# Patient Record
Sex: Female | Born: 1965
Health system: Southern US, Community
[De-identification: ages and names within clinical notes are randomized; demographics above are authoritative.]

## PROBLEM LIST (undated history)

## (undated) DIAGNOSIS — M779 Enthesopathy, unspecified: Secondary | ICD-10-CM

## (undated) DIAGNOSIS — G709 Myoneural disorder, unspecified: Secondary | ICD-10-CM

## (undated) DIAGNOSIS — F32A Depression, unspecified: Secondary | ICD-10-CM

## (undated) DIAGNOSIS — C679 Malignant neoplasm of bladder, unspecified: Secondary | ICD-10-CM

## (undated) DIAGNOSIS — M1990 Unspecified osteoarthritis, unspecified site: Secondary | ICD-10-CM

## (undated) DIAGNOSIS — E785 Hyperlipidemia, unspecified: Secondary | ICD-10-CM

## (undated) DIAGNOSIS — Z8616 Personal history of COVID-19: Secondary | ICD-10-CM

## (undated) DIAGNOSIS — K219 Gastro-esophageal reflux disease without esophagitis: Secondary | ICD-10-CM

## (undated) DIAGNOSIS — G4730 Sleep apnea, unspecified: Secondary | ICD-10-CM

## (undated) DIAGNOSIS — F329 Major depressive disorder, single episode, unspecified: Secondary | ICD-10-CM

## (undated) DIAGNOSIS — M549 Dorsalgia, unspecified: Secondary | ICD-10-CM

## (undated) DIAGNOSIS — J449 Chronic obstructive pulmonary disease, unspecified: Secondary | ICD-10-CM

## (undated) DIAGNOSIS — D494 Neoplasm of unspecified behavior of bladder: Secondary | ICD-10-CM

## (undated) DIAGNOSIS — R51 Headache: Secondary | ICD-10-CM

## (undated) DIAGNOSIS — I10 Essential (primary) hypertension: Secondary | ICD-10-CM

## (undated) DIAGNOSIS — J45909 Unspecified asthma, uncomplicated: Secondary | ICD-10-CM

## (undated) DIAGNOSIS — E119 Type 2 diabetes mellitus without complications: Secondary | ICD-10-CM

## (undated) DIAGNOSIS — I700 Atherosclerosis of aorta: Secondary | ICD-10-CM

## (undated) DIAGNOSIS — R519 Headache, unspecified: Secondary | ICD-10-CM

## (undated) DIAGNOSIS — F419 Anxiety disorder, unspecified: Secondary | ICD-10-CM

## (undated) HISTORY — DX: Unspecified asthma, uncomplicated: J45.909

## (undated) HISTORY — DX: Anxiety disorder, unspecified: F41.9

## (undated) HISTORY — DX: Sleep apnea, unspecified: G47.30

## (undated) HISTORY — PX: OTHER SURGICAL HISTORY: SHX169

## (undated) HISTORY — DX: Hyperlipidemia, unspecified: E78.5

## (undated) HISTORY — DX: Headache, unspecified: R51.9

## (undated) HISTORY — DX: Depression, unspecified: F32.A

## (undated) HISTORY — DX: Chronic obstructive pulmonary disease, unspecified: J44.9

## (undated) HISTORY — DX: Enthesopathy, unspecified: M77.9

## (undated) HISTORY — DX: Major depressive disorder, single episode, unspecified: F32.9

## (undated) HISTORY — DX: Dorsalgia, unspecified: M54.9

## (undated) HISTORY — DX: Personal history of COVID-19: Z86.16

## (undated) HISTORY — DX: Myoneural disorder, unspecified: G70.9

## (undated) HISTORY — PX: COLONOSCOPY: SHX174

## (undated) HISTORY — DX: Headache: R51

---

## 1997-08-16 ENCOUNTER — Inpatient Hospital Stay (HOSPITAL_COMMUNITY): Admission: EM | Admit: 1997-08-16 | Discharge: 1997-08-23 | Payer: Self-pay | Admitting: Emergency Medicine

## 2006-03-31 ENCOUNTER — Emergency Department: Payer: Self-pay | Admitting: General Practice

## 2006-08-14 ENCOUNTER — Emergency Department: Payer: Self-pay | Admitting: Emergency Medicine

## 2009-11-05 ENCOUNTER — Emergency Department: Payer: Self-pay | Admitting: Emergency Medicine

## 2010-05-09 ENCOUNTER — Emergency Department (HOSPITAL_COMMUNITY)
Admission: EM | Admit: 2010-05-09 | Discharge: 2010-05-09 | Disposition: A | Discharge status: Home / Self Care | Payer: Self-pay | Attending: Emergency Medicine | Admitting: Emergency Medicine

## 2010-05-09 DIAGNOSIS — M545 Low back pain, unspecified: Secondary | ICD-10-CM | POA: Insufficient documentation

## 2010-05-09 DIAGNOSIS — J029 Acute pharyngitis, unspecified: Secondary | ICD-10-CM | POA: Insufficient documentation

## 2010-05-09 DIAGNOSIS — IMO0001 Reserved for inherently not codable concepts without codable children: Secondary | ICD-10-CM | POA: Insufficient documentation

## 2010-05-09 DIAGNOSIS — R7309 Other abnormal glucose: Secondary | ICD-10-CM | POA: Insufficient documentation

## 2010-05-09 DIAGNOSIS — R059 Cough, unspecified: Secondary | ICD-10-CM | POA: Insufficient documentation

## 2010-05-09 DIAGNOSIS — B9789 Other viral agents as the cause of diseases classified elsewhere: Secondary | ICD-10-CM | POA: Insufficient documentation

## 2010-05-09 DIAGNOSIS — R05 Cough: Secondary | ICD-10-CM | POA: Insufficient documentation

## 2010-05-09 DIAGNOSIS — R110 Nausea: Secondary | ICD-10-CM | POA: Insufficient documentation

## 2010-05-09 LAB — URINALYSIS, ROUTINE W REFLEX MICROSCOPIC
Hgb urine dipstick: NEGATIVE
Protein, ur: NEGATIVE mg/dL
Specific Gravity, Urine: 1.043 — ABNORMAL HIGH (ref 1.005–1.030)
Urine Glucose, Fasting: >1000 mg/dL — AB

## 2010-05-09 LAB — URINE MICROSCOPIC-ADD ON

## 2010-05-09 LAB — POCT I-STAT, CHEM 8
BUN: 9 mg/dL (ref 6–23)
Creatinine, Ser: 0.7 mg/dL (ref 0.4–1.2)
Sodium: 139 mEq/L (ref 135–145)
TCO2: 23 mmol/L (ref 0–100)

## 2010-09-18 ENCOUNTER — Emergency Department: Payer: Self-pay | Admitting: Internal Medicine

## 2010-09-27 ENCOUNTER — Emergency Department: Payer: Self-pay | Admitting: Internal Medicine

## 2010-11-23 ENCOUNTER — Emergency Department: Payer: Self-pay | Admitting: Emergency Medicine

## 2010-11-25 ENCOUNTER — Emergency Department (HOSPITAL_COMMUNITY)
Admission: EM | Admit: 2010-11-25 | Discharge: 2010-11-25 | Disposition: A | Discharge status: Home / Self Care | Payer: Self-pay | Attending: Emergency Medicine | Admitting: Emergency Medicine

## 2010-11-25 DIAGNOSIS — R220 Localized swelling, mass and lump, head: Secondary | ICD-10-CM | POA: Insufficient documentation

## 2010-11-25 DIAGNOSIS — K029 Dental caries, unspecified: Secondary | ICD-10-CM | POA: Insufficient documentation

## 2010-11-25 DIAGNOSIS — F17200 Nicotine dependence, unspecified, uncomplicated: Secondary | ICD-10-CM | POA: Insufficient documentation

## 2010-11-25 DIAGNOSIS — K047 Periapical abscess without sinus: Secondary | ICD-10-CM | POA: Insufficient documentation

## 2010-11-25 DIAGNOSIS — R509 Fever, unspecified: Secondary | ICD-10-CM | POA: Insufficient documentation

## 2010-11-25 DIAGNOSIS — K089 Disorder of teeth and supporting structures, unspecified: Secondary | ICD-10-CM | POA: Insufficient documentation

## 2010-12-08 ENCOUNTER — Emergency Department: Payer: Self-pay | Admitting: Internal Medicine

## 2011-04-20 ENCOUNTER — Emergency Department: Payer: Self-pay | Admitting: Emergency Medicine

## 2011-04-20 LAB — COMPREHENSIVE METABOLIC PANEL
Albumin: 3.7 g/dL (ref 3.4–5.0)
Alkaline Phosphatase: 93 U/L (ref 50–136)
Bilirubin,Total: 0.3 mg/dL (ref 0.2–1.0)
Calcium, Total: 8.7 mg/dL (ref 8.5–10.1)
Chloride: 105 mmol/L (ref 98–107)
Co2: 28 mmol/L (ref 21–32)
Creatinine: 0.53 mg/dL — ABNORMAL LOW (ref 0.60–1.30)
EGFR (African American): >60
EGFR (Non-African Amer.): >60
Glucose: 119 mg/dL — ABNORMAL HIGH (ref 65–99)
Potassium: 4 mmol/L (ref 3.5–5.1)
SGOT(AST): 11 U/L — ABNORMAL LOW (ref 15–37)
Sodium: 142 mmol/L (ref 136–145)

## 2011-04-20 LAB — URINALYSIS, COMPLETE
Glucose,UR: NEGATIVE mg/dL (ref 0–75)
Ketone: NEGATIVE
Protein: NEGATIVE
RBC,UR: 1 /HPF (ref 0–5)
Specific Gravity: 1.014 (ref 1.003–1.030)
WBC UR: 4 /HPF (ref 0–5)

## 2011-04-20 LAB — CBC
HCT: 35.1 % (ref 35.0–47.0)
HGB: 11 g/dL — ABNORMAL LOW (ref 12.0–16.0)
MCH: 23.7 pg — ABNORMAL LOW (ref 26.0–34.0)
MCHC: 31.3 g/dL — ABNORMAL LOW (ref 32.0–36.0)
RDW: 17.9 % — ABNORMAL HIGH (ref 11.5–14.5)

## 2011-04-20 LAB — RAPID INFLUENZA A&B ANTIGENS

## 2011-07-07 ENCOUNTER — Emergency Department (HOSPITAL_COMMUNITY): Payer: Self-pay

## 2011-07-07 ENCOUNTER — Encounter (HOSPITAL_COMMUNITY): Payer: Self-pay | Admitting: *Deleted

## 2011-07-07 ENCOUNTER — Emergency Department (HOSPITAL_COMMUNITY)
Admission: EM | Admit: 2011-07-07 | Discharge: 2011-07-07 | Disposition: A | Discharge status: Home / Self Care | Payer: Self-pay | Attending: Emergency Medicine | Admitting: Emergency Medicine

## 2011-07-07 DIAGNOSIS — M5431 Sciatica, right side: Secondary | ICD-10-CM

## 2011-07-07 DIAGNOSIS — M549 Dorsalgia, unspecified: Secondary | ICD-10-CM | POA: Insufficient documentation

## 2011-07-07 DIAGNOSIS — M5430 Sciatica, unspecified side: Secondary | ICD-10-CM | POA: Insufficient documentation

## 2011-07-07 MED ORDER — PREDNISONE 50 MG PO TABS: 50.0000 mg | ORAL_TABLET | Freq: Every day | ORAL | Status: AC

## 2011-07-07 MED ORDER — PREDNISONE 20 MG PO TABS
60.0000 mg | ORAL_TABLET | Freq: Once | ORAL | Status: AC
  Administered 2011-07-07: 60 mg via ORAL
  Filled 2011-07-07: qty 3

## 2011-07-07 MED ORDER — DIAZEPAM 5 MG PO TABS: 5.0000 mg | ORAL_TABLET | Freq: Two times a day (BID) | ORAL | Status: AC | PRN

## 2011-07-07 MED ORDER — NAPROXEN 500 MG PO TABS: 500.0000 mg | ORAL_TABLET | Freq: Two times a day (BID) | ORAL | Status: DC

## 2011-07-07 MED ORDER — MORPHINE SULFATE 4 MG/ML IJ SOLN
4.0000 mg | Freq: Once | INTRAMUSCULAR | Status: AC
  Administered 2011-07-07: 4 mg via INTRAMUSCULAR
  Filled 2011-07-07: qty 1

## 2011-07-07 MED ORDER — HYDROCODONE-ACETAMINOPHEN 5-325 MG PO TABS: 1.0000 | ORAL_TABLET | ORAL | Status: AC | PRN

## 2011-07-07 MED ORDER — DIAZEPAM 5 MG PO TABS
5.0000 mg | ORAL_TABLET | Freq: Once | ORAL | Status: AC
  Administered 2011-07-07: 5 mg via ORAL
  Filled 2011-07-07: qty 1

## 2011-07-07 MED ORDER — KETOROLAC TROMETHAMINE 60 MG/2ML IM SOLN
60.0000 mg | Freq: Once | INTRAMUSCULAR | Status: AC
  Administered 2011-07-07: 60 mg via INTRAMUSCULAR
  Filled 2011-07-07: qty 2

## 2011-07-07 NOTE — ED Provider Notes (Addendum)
History     CSN: [id number]  Arrival date & time 07/07/11  1125   First MD Initiated Contact with Patient 07/07/11 1227      Chief Complaint  Patient presents with  . Back Pain    (Consider location/radiation/quality/duration/timing/severity/associated sxs/prior treatment) HPI Comments: Patient presents after a fall at work for 3-4 weeks ago.  Patient was lifting a box and when she tripped over the pallet lifter she fell and hit her back on the machinery.  She did followup with her primary care physician and per on tramadol and Flexeril.  Patient has noted persistent and worsening pain in her low back worse on the right side.  The pain now radiates down her right thigh.  There is no weakness or numbness.  No bladder or bowel incontinence.  No fevers or chills.  Patient is a 46 y.o. female presenting with back pain. The history is provided by the patient and the spouse. No language interpreter was used.  Back Pain  This is a new problem. The current episode started more than 1 week ago. The problem occurs constantly. The problem has been gradually worsening. The pain is associated with falling. The pain is present in the lumbar spine. The quality of the pain is described as shooting. The pain radiates to the right thigh. The pain is severe. The symptoms are aggravated by certain positions. Pertinent negatives include no chest pain, no fever, no headaches, no abdominal pain and no dysuria.    Past Medical History  Diagnosis Date  . Diabetes mellitus     Past Surgical History  Procedure Date  . Other surgical history     3 c-sections    History reviewed. No pertinent family history.  History  Substance Use Topics  . Smoking status: Current Everyday Smoker  . Smokeless tobacco: Not on file  . Alcohol Use: No    OB History    Grav Para Term Preterm Abortions TAB SAB Ect Mult Living                  Review of Systems  Constitutional: Negative.  Negative for fever and chills.   HENT: Negative.   Eyes: Negative.  Negative for discharge and redness.  Respiratory: Negative.  Negative for cough and shortness of breath.   Cardiovascular: Negative.  Negative for chest pain.  Gastrointestinal: Negative.  Negative for nausea, vomiting, abdominal pain and diarrhea.  Genitourinary: Negative.  Negative for dysuria and vaginal discharge.  Musculoskeletal: Positive for back pain.  Skin: Negative.  Negative for color change and rash.  Neurological: Negative.  Negative for syncope and headaches.  Hematological: Negative.  Negative for adenopathy.  Psychiatric/Behavioral: Negative.  Negative for confusion.  All other systems reviewed and are negative.    Allergies  Penicillins  Home Medications  No current outpatient prescriptions on file.  BP 132/62  Pulse 75  Temp(Src) 98.3 F (36.8 C) (Oral)  Resp 20  SpO2 100%  LMP 06/14/2011  Physical Exam  Nursing note and vitals reviewed. Constitutional: She is oriented to person, place, and time. She appears well-developed and well-nourished.  Non-toxic appearance. She does not have a sickly appearance.  HENT:  Head: Normocephalic and atraumatic.  Eyes: Conjunctivae, EOM and lids are normal. Pupils are equal, round, and reactive to light. No scleral icterus.  Neck: Trachea normal and normal range of motion. Neck supple.  Cardiovascular: Normal rate, regular rhythm and normal heart sounds.   Pulmonary/Chest: Effort normal and breath sounds normal. No respiratory  distress. She has no wheezes. She has no rales.  Abdominal: Soft. Normal appearance. There is no tenderness. There is no rebound, no guarding and no CVA tenderness.  Musculoskeletal:       Mid L-spine tenderness to palpation on exam.  There's also tenderness to palpation in the right paraspinal lumbar region.  Patient is able to stand up.  She notes sensation light touch is symmetric in her lower extremities.  Patient is ambulatory here.  Neurological: She is alert  and oriented to person, place, and time. She has normal strength.  Skin: Skin is warm, dry and intact. No rash noted.  Psychiatric: She has a normal mood and affect. Her behavior is normal. Judgment and thought content normal.    ED Course  Procedures (including critical care time)  Dg Lumbar Spine Complete  07/07/2011  *RADIOLOGY REPORT*  Clinical Data: Back pain, status post fall  LUMBAR SPINE - COMPLETE 4+ VIEW  Comparison: None.  Findings: Five views of the lumbar spine submitted.  No acute fracture or subluxation.  There is  moderate disc space flattening at L5-S1 level.  Alignment and vertebral height are preserved.  IMPRESSION: No acute fracture or subluxation.  Moderate disc space flattening at L5 S1 level.  Original Report Authenticated By: Natasha Mead, M.D.      MDM  I will obtain an x-ray of the patient's back due to her initial injury and tenderness to palpation of her L-spine today.  Patient may have a lumbar radiculopathy given that her pain radiates down her right thigh.  I will change the patient's muscle relaxant to Valium from Flexeril as the patient noted the Flexeril made her sick.  I will also place her on a course of steroids as this may help decrease the inflammation and improve the patient's pain.  She is artery planning to see her [facility] at [facility] in a few weeks for reevaluation.        Nat Christen, MD 07/07/11 1246    Nat Christen, MD 07/07/11 1322

## 2011-07-07 NOTE — Discharge Instructions (Signed)

## 2011-07-07 NOTE — ED Notes (Signed)
Pt fell one month ago at work and had a knot there.  Now with pain that is running down her right leg. No incontinence of urine or stool.

## 2011-08-21 ENCOUNTER — Other Ambulatory Visit (HOSPITAL_COMMUNITY): Payer: Self-pay | Admitting: Family Medicine

## 2011-08-21 DIAGNOSIS — M545 Low back pain, unspecified: Secondary | ICD-10-CM

## 2011-08-22 ENCOUNTER — Other Ambulatory Visit (HOSPITAL_COMMUNITY): Payer: Self-pay

## 2011-08-27 ENCOUNTER — Ambulatory Visit (HOSPITAL_COMMUNITY)
Admission: RE | Admit: 2011-08-27 | Discharge: 2011-08-27 | Disposition: A | Discharge status: Home / Self Care | Payer: Self-pay | Source: Ambulatory Visit | Attending: Family Medicine | Admitting: Family Medicine

## 2011-08-27 DIAGNOSIS — F40298 Other specified phobia: Secondary | ICD-10-CM | POA: Insufficient documentation

## 2011-08-27 DIAGNOSIS — M545 Low back pain: Secondary | ICD-10-CM

## 2011-08-27 DIAGNOSIS — R29898 Other symptoms and signs involving the musculoskeletal system: Secondary | ICD-10-CM | POA: Insufficient documentation

## 2011-08-27 DIAGNOSIS — M5126 Other intervertebral disc displacement, lumbar region: Secondary | ICD-10-CM | POA: Insufficient documentation

## 2011-08-27 DIAGNOSIS — R209 Unspecified disturbances of skin sensation: Secondary | ICD-10-CM | POA: Insufficient documentation

## 2012-01-08 ENCOUNTER — Emergency Department: Payer: Self-pay | Admitting: Emergency Medicine

## 2012-01-10 ENCOUNTER — Encounter (HOSPITAL_COMMUNITY): Payer: Self-pay | Admitting: *Deleted

## 2012-01-10 ENCOUNTER — Emergency Department (HOSPITAL_COMMUNITY)
Admission: EM | Admit: 2012-01-10 | Discharge: 2012-01-10 | Disposition: A | Discharge status: Home / Self Care | Payer: Self-pay | Attending: Emergency Medicine | Admitting: Emergency Medicine

## 2012-01-10 DIAGNOSIS — F17200 Nicotine dependence, unspecified, uncomplicated: Secondary | ICD-10-CM | POA: Insufficient documentation

## 2012-01-10 DIAGNOSIS — K0889 Other specified disorders of teeth and supporting structures: Secondary | ICD-10-CM

## 2012-01-10 DIAGNOSIS — Z880 Allergy status to penicillin: Secondary | ICD-10-CM | POA: Insufficient documentation

## 2012-01-10 DIAGNOSIS — K029 Dental caries, unspecified: Secondary | ICD-10-CM | POA: Insufficient documentation

## 2012-01-10 DIAGNOSIS — E119 Type 2 diabetes mellitus without complications: Secondary | ICD-10-CM | POA: Insufficient documentation

## 2012-01-10 MED ORDER — OXYCODONE-ACETAMINOPHEN 5-325 MG PO TABS: 1.0000 | ORAL_TABLET | Freq: Once | ORAL | Status: DC

## 2012-01-10 MED ORDER — AMOXICILLIN 500 MG PO CAPS: 500.0000 mg | ORAL_CAPSULE | Freq: Three times a day (TID) | ORAL | Status: DC

## 2012-01-10 MED ORDER — OXYCODONE-ACETAMINOPHEN 5-325 MG PO TABS
1.0000 | ORAL_TABLET | Freq: Once | ORAL | Status: AC
  Administered 2012-01-10: 1 via ORAL
  Filled 2012-01-10: qty 1

## 2012-01-10 NOTE — ED Notes (Signed)
Pt went to [facility] 2 days ago for dental pain. Was Rx Motrin and and abx but did not get abx filled (no money) States has not slept for 2 days.

## 2012-01-10 NOTE — ED Notes (Signed)
Pt here for dental pain (front, upper right)

## 2012-01-10 NOTE — ED Provider Notes (Signed)
History   This chart was scribed for No att. providers found by Toya Smothers. The patient was seen in room TR05C/TR05C. Patient's care was started at 1138.  CSN: [id number]  Arrival date & time 01/10/12  1138   First MD Initiated Contact with Patient 01/10/12 1226      Chief Complaint  Patient presents with  . Dental Pain   The history is provided by the patient. No language interpreter was used.    Brandi Calderon is a 46 y.o. female with a h/o diabetes mellitus who presents to the Emergency Department complaining of 3 days of new gradual onset severe constant worsening RU maxillary dental pain. Pain is described as a sharp soreness. Pt endorses associate moderate constant HA. She reports evaluation at [facility] 2 days ago for the same ailment, and was unable to fill antibiotic prescription. Has taken Metformin with no relief. Pt denies fever, chills, emesis, rash, and cough.   Past Medical History  Diagnosis Date  . Diabetes mellitus     Past Surgical History  Procedure Date  . Other surgical history     3 c-sections    No family history on file.  History  Substance Use Topics  . Smoking status: Current Every Day Smoker  . Smokeless tobacco: Not on file  . Alcohol Use: No   Review of Systems  Constitutional: Negative for fatigue.  HENT: Positive for dental problem. Negative for congestion, sinus pressure and ear discharge.   Eyes: Negative for discharge.  Respiratory: Negative for cough.   Cardiovascular: Negative for chest pain.  Gastrointestinal: Negative for abdominal pain and diarrhea.  Musculoskeletal: Negative for back pain.  Skin: Negative for rash.  Neurological: Positive for headaches.  Hematological: Negative.   Psychiatric/Behavioral: Negative for hallucinations.  All other systems reviewed and are negative.    Allergies  Penicillins  Home Medications   Current Outpatient Rx  Name Route Sig Dispense Refill  .  ASPIRIN-SALICYLAMIDE-CAFFEINE 650-195-33.3 MG PO PACK Oral Take 2 tablets by mouth 2 (two) times daily as needed. For pain    . VITAMIN D 1000 UNITS PO TABS Oral Take 1,000 Units by mouth daily.    [name] Kitchen FERROUS SULFATE 325 (65 FE) MG PO TABS Oral Take 325 mg by mouth daily with breakfast.    . METFORMIN HCL 500 MG PO TABS Oral Take 1,000 mg by mouth 2 (two) times daily with a meal.    . NAPROXEN SODIUM 220 MG PO TABS Oral Take 220 mg by mouth 2 (two) times daily with a meal.    . PRAVASTATIN SODIUM 40 MG PO TABS Oral Take 40 mg by mouth daily.    . AMOXICILLIN 500 MG PO CAPS Oral Take 1 capsule (500 mg total) by mouth 3 (three) times daily. 21 capsule 0  . OXYCODONE-ACETAMINOPHEN 5-325 MG PO TABS Oral Take 1 tablet by mouth once. 20 tablet 0    BP 120/80  Pulse 72  Temp 98.3 F (36.8 C) (Oral)  Resp 16  SpO2 98%  LMP 12/31/2011  Physical Exam  Nursing note and vitals reviewed. Constitutional: She is oriented to person, place, and time. She appears well-developed and well-nourished. No distress.  HENT:  Head: Normocephalic and atraumatic.       Moderate plaque on all of her teeth. RU lateral incisor is nearly avulsed and displaced. Appears to be a carrie in the tooth. Adjacent premolar has carries as well. No visible or palpable abscess.  Eyes: Conjunctivae normal and EOM are  normal.  Neck: Neck supple. No tracheal deviation present.  Cardiovascular: Normal rate.   Pulmonary/Chest: Effort normal. No respiratory distress.  Abdominal: She exhibits no distension.  Musculoskeletal: Normal range of motion.  Neurological: She is alert and oriented to person, place, and time. No sensory deficit.  Skin: Skin is dry.  Psychiatric: She has a normal mood and affect. Her behavior is normal.    ED Course  Procedures DIAGNOSTIC STUDIES: Oxygen Saturation is 98% on room air, normal by my interpretation.    COORDINATION OF CARE: 12:52- Evaluated Pt. Pt is awake, alert, and oriented. 12:56-  Patient informed of clinical course, understand medical decision-making process, and agree with plan. Plan: Home Medications- Percocet/Roxicet and new antibiotics; Home Treatments- ; Recommended follow up- Dentist.    Labs Reviewed - No data to display No results found.   1. Pain, dental       MDM  Nonspecific dental pain. No abscess. Stable for discharge    I personally performed the services described in this documentation, which was scribed in my presence. The recorded information has been reviewed and considered.      Flint Melter, MD 01/10/12 [phone]

## 2012-04-14 ENCOUNTER — Emergency Department: Payer: Self-pay | Admitting: Emergency Medicine

## 2012-04-14 LAB — RAPID INFLUENZA A&B ANTIGENS

## 2012-07-25 ENCOUNTER — Emergency Department (HOSPITAL_COMMUNITY)
Admission: EM | Admit: 2012-07-25 | Discharge: 2012-07-25 | Disposition: A | Discharge status: Home / Self Care | Payer: Self-pay | Attending: Emergency Medicine | Admitting: Emergency Medicine

## 2012-07-25 ENCOUNTER — Encounter (HOSPITAL_COMMUNITY): Payer: Self-pay | Admitting: Physical Medicine and Rehabilitation

## 2012-07-25 DIAGNOSIS — F17200 Nicotine dependence, unspecified, uncomplicated: Secondary | ICD-10-CM | POA: Insufficient documentation

## 2012-07-25 DIAGNOSIS — K029 Dental caries, unspecified: Secondary | ICD-10-CM | POA: Insufficient documentation

## 2012-07-25 DIAGNOSIS — E119 Type 2 diabetes mellitus without complications: Secondary | ICD-10-CM | POA: Insufficient documentation

## 2012-07-25 MED ORDER — CLINDAMYCIN HCL 150 MG PO CAPS
300.0000 mg | ORAL_CAPSULE | Freq: Once | ORAL | Status: AC
  Administered 2012-07-25: 300 mg via ORAL
  Filled 2012-07-25: qty 2

## 2012-07-25 MED ORDER — CLINDAMYCIN HCL 300 MG PO CAPS: 300.0000 mg | ORAL_CAPSULE | Freq: Three times a day (TID) | ORAL | Status: DC

## 2012-07-25 MED ORDER — OXYCODONE-ACETAMINOPHEN 5-325 MG PO TABS
1.0000 | ORAL_TABLET | Freq: Once | ORAL | Status: AC
  Administered 2012-07-25: 1 via ORAL
  Filled 2012-07-25: qty 1

## 2012-07-25 MED ORDER — OXYCODONE-ACETAMINOPHEN 5-325 MG PO TABS: 1.0000 | ORAL_TABLET | Freq: Once | ORAL | Status: DC

## 2012-07-25 NOTE — ED Provider Notes (Signed)
Medical screening examination/treatment/procedure(s) were performed by non-physician practitioner and as supervising physician I was immediately available for consultation/collaboration.   Charles B. Sheldon, MD 07/25/12 1542 

## 2012-07-25 NOTE — ED Notes (Signed)
Pt presents to department for evaluation of R upper tooth pain. States issues with dental problems in the past. 10/10 pain, R sided facial swelling noted. Pt is alert and oriented x4. Respirations unlabored.

## 2012-07-25 NOTE — ED Provider Notes (Signed)
History     CSN: [id number]  Arrival date & time 07/25/12  1242   First MD Initiated Contact with Patient 07/25/12 1425      Chief Complaint  Patient presents with  . Dental Pain    (Consider location/radiation/quality/duration/timing/severity/associated sxs/prior treatment) Patient is a 47 y.o. female presenting with tooth pain. The history is provided by the patient.  Dental PainThe primary symptoms include mouth pain. Primary symptoms do not include fever.  Additional symptoms include: facial swelling. Additional symptoms do not include: trouble swallowing. Associated symptoms comments: Tooth on upper right significantly painful for the past several days, getting worse.[name] Kitchen    Past Medical History  Diagnosis Date  . Diabetes mellitus     Past Surgical History  Procedure Laterality Date  . Other surgical history      3 c-sections    No family history on file.  History  Substance Use Topics  . Smoking status: Current Every Day Smoker    Types: Cigarettes  . Smokeless tobacco: Not on file  . Alcohol Use: No    OB History   Grav Para Term Preterm Abortions TAB SAB Ect Mult Living                  Review of Systems  Constitutional: Negative for fever and chills.  HENT: Positive for facial swelling and dental problem. Negative for trouble swallowing.     Allergies  Penicillins  Home Medications   Current Outpatient Rx  Name  Route  Sig  Dispense  Refill  . metFORMIN (GLUCOPHAGE) 500 MG tablet   Oral   Take 1,000 mg by mouth 2 (two) times daily with a meal.           BP 153/91  Pulse 77  Temp(Src) 97 F (36.1 C) (Axillary)  Resp 20  SpO2 96%  LMP 05/30/2012  Physical Exam  Constitutional: She is oriented to person, place, and time. She appears well-developed and well-nourished.  HENT:  Generally poor dentition, partially edentulous. #4 tender, moderate surrounding swelling without discrete abscess, moderate decay.  Neck: Normal range of motion.   Pulmonary/Chest: Effort normal.  Lymphadenopathy:    She has cervical adenopathy.  Neurological: She is alert and oriented to person, place, and time.  Skin: Skin is warm and dry.    ED Course  Procedures (including critical care time)  Labs Reviewed - No data to display No results found.   No diagnosis found.  1. Dental pain  MDM  Uncomplicated dental pain.        Arnoldo Hooker, PA-C 07/25/12 1514

## 2012-07-30 ENCOUNTER — Telehealth (HOSPITAL_COMMUNITY): Payer: Self-pay | Admitting: Emergency Medicine

## 2012-07-30 NOTE — ED Notes (Signed)
Faxed over a copy of pt referral to their office [phone].

## 2012-10-25 ENCOUNTER — Emergency Department (HOSPITAL_COMMUNITY)
Admission: EM | Admit: 2012-10-25 | Discharge: 2012-10-25 | Disposition: A | Discharge status: Home / Self Care | Payer: Self-pay | Attending: Emergency Medicine | Admitting: Emergency Medicine

## 2012-10-25 ENCOUNTER — Encounter (HOSPITAL_COMMUNITY): Payer: Self-pay | Admitting: Emergency Medicine

## 2012-10-25 DIAGNOSIS — K0889 Other specified disorders of teeth and supporting structures: Secondary | ICD-10-CM

## 2012-10-25 DIAGNOSIS — E119 Type 2 diabetes mellitus without complications: Secondary | ICD-10-CM | POA: Insufficient documentation

## 2012-10-25 DIAGNOSIS — F17200 Nicotine dependence, unspecified, uncomplicated: Secondary | ICD-10-CM | POA: Insufficient documentation

## 2012-10-25 DIAGNOSIS — K089 Disorder of teeth and supporting structures, unspecified: Secondary | ICD-10-CM | POA: Insufficient documentation

## 2012-10-25 DIAGNOSIS — Z880 Allergy status to penicillin: Secondary | ICD-10-CM | POA: Insufficient documentation

## 2012-10-25 MED ORDER — GLYBURIDE-METFORMIN 5-500 MG PO TABS: 1.0000 | ORAL_TABLET | Freq: Two times a day (BID) | ORAL | Status: DC

## 2012-10-25 MED ORDER — HYDROCODONE-ACETAMINOPHEN 5-325 MG PO TABS: 2.0000 | ORAL_TABLET | ORAL | Status: DC | PRN

## 2012-10-25 MED ORDER — CLINDAMYCIN HCL 300 MG PO CAPS: ORAL_CAPSULE | ORAL | Status: DC

## 2012-10-25 MED ORDER — HYDROCODONE-ACETAMINOPHEN 5-325 MG PO TABS
2.0000 | ORAL_TABLET | Freq: Once | ORAL | Status: AC
  Administered 2012-10-25: 2 via ORAL
  Filled 2012-10-25: qty 2

## 2012-10-25 NOTE — Discharge Instructions (Signed)

## 2012-10-25 NOTE — ED Provider Notes (Signed)
This chart was scribed for non-physician practitioner Trisha Mangle, PA-C working with Derwood Kaplan, MD, by Candelaria Stagers, ED Scribe. This patient was seen in room TR11C/TR11C and the patient's care was started at 5:44 PM  CSN: [id number]     Arrival date & time 10/25/12  1409 History     First MD Initiated Contact with Patient 10/25/12 1741     Chief Complaint  Patient presents with  . Dental Problem    The history is provided by the patient. No language interpreter was used.   HPI Comments: Brandi Calderon is a 47 y.o. female who presents to the Emergency Department complaining of right upper dental pain that started about one month ago and became worse ofver the last few days .  Pt reports a bad taste in her mouth.  She reports she has been referred to a dentist previously who denied her.  She has taken antibiotics with relief in the past.  Pt has h/o diabetes and request a refill of metformin.       Past Medical History  Diagnosis Date  . Diabetes mellitus    Past Surgical History  Procedure Laterality Date  . Other surgical history      3 c-sections   No family history on file. History  Substance Use Topics  . Smoking status: Current Every Day Smoker    Types: Cigarettes  . Smokeless tobacco: Not on file  . Alcohol Use: No   OB History   Grav Para Term Preterm Abortions TAB SAB Ect Mult Living                 Review of Systems  HENT: Positive for dental problem (right upper dental pain).   All other systems reviewed and are negative.    Allergies  Penicillins  Home Medications   Current Outpatient Rx  Name  Route  Sig  Dispense  Refill  . glyBURIDE-metformin (GLUCOVANCE) 5-500 MG per tablet   Oral   Take 1 tablet by mouth 2 (two) times daily with a meal.          BP 141/81  Pulse 75  Temp(Src) 98.5 F (36.9 C) (Oral)  Resp 18  SpO2 97%  LMP 09/29/2012 Physical Exam  Nursing note and vitals reviewed. Constitutional: She is oriented to  person, place, and time. She appears well-developed and well-nourished. No distress.  HENT:  Head: Normocephalic and atraumatic.  Right upper canine is loose with swelling around the gum and foul odor.    Eyes: EOM are normal.  Neck: Neck supple. No tracheal deviation present.  Cardiovascular: Normal rate.   Pulmonary/Chest: Effort normal. No respiratory distress.  Musculoskeletal: Normal range of motion.  Neurological: She is alert and oriented to person, place, and time.  Skin: Skin is warm and dry.  Psychiatric: She has a normal mood and affect. Her behavior is normal.    ED Course   Procedures   DIAGNOSTIC STUDIES: Oxygen Saturation is 97% on room air, normal by my interpretation.    COORDINATION OF CARE:  5:49 PM Discussed course of care with pt which includes antibiotics and pain medication.  Will provide referral to dentist.  Will refill metformin.  Pt understands and agrees.    Labs Reviewed - No data to display No results found. 1. Toothache     MDM   I personally performed the services in this documentation, which was scribed in my presence.  The recorded information has been reviewed and considered.  Barnet Pall.   Lonia Skinner Karlstad, PA-C 10/25/12 1759

## 2012-10-25 NOTE — ED Notes (Signed)
Pt. Stated, I've had a tooth problem for about a month.

## 2012-10-25 NOTE — ED Notes (Signed)
PA at bedside.

## 2012-10-26 NOTE — ED Provider Notes (Signed)
Medical screening examination/treatment/procedure(s) were performed by non-physician practitioner and as supervising physician I was immediately available for consultation/collaboration.  Burney Calzadilla, MD 10/26/12 0031 

## 2012-12-07 ENCOUNTER — Emergency Department: Payer: Self-pay | Admitting: Emergency Medicine

## 2012-12-07 LAB — CBC
HCT: 41.7 % (ref 35.0–47.0)
MCH: 26.6 pg (ref 26.0–34.0)
MCHC: 33.2 g/dL (ref 32.0–36.0)
MCV: 80 fL (ref 80–100)
RBC: 5.2 10*6/uL (ref 3.80–5.20)
WBC: 13.1 10*3/uL — ABNORMAL HIGH (ref 3.6–11.0)

## 2012-12-07 LAB — COMPREHENSIVE METABOLIC PANEL
Albumin: 3.5 g/dL (ref 3.4–5.0)
Alkaline Phosphatase: 157 U/L — ABNORMAL HIGH (ref 50–136)
Anion Gap: 6 — ABNORMAL LOW (ref 7–16)
BUN: 10 mg/dL (ref 7–18)
Calcium, Total: 8.6 mg/dL (ref 8.5–10.1)
Co2: 25 mmol/L (ref 21–32)
Creatinine: 0.63 mg/dL (ref 0.60–1.30)
EGFR (African American): >60
EGFR (Non-African Amer.): >60
Glucose: 274 mg/dL — ABNORMAL HIGH (ref 65–99)
Potassium: 3.4 mmol/L — ABNORMAL LOW (ref 3.5–5.1)
SGPT (ALT): 25 U/L (ref 12–78)
Total Protein: 7.3 g/dL (ref 6.4–8.2)

## 2012-12-07 LAB — URINALYSIS, COMPLETE
Bilirubin,UR: NEGATIVE
Glucose,UR: >=500 mg/dL (ref 0–75)
Ph: 6 (ref 4.5–8.0)
Protein: NEGATIVE
RBC,UR: 166 /HPF (ref 0–5)
Specific Gravity: 1.033 (ref 1.003–1.030)

## 2013-01-06 ENCOUNTER — Emergency Department (HOSPITAL_COMMUNITY)
Admission: EM | Admit: 2013-01-06 | Discharge: 2013-01-06 | Disposition: A | Discharge status: Home / Self Care | Payer: PRIVATE HEALTH INSURANCE | Attending: Emergency Medicine | Admitting: Emergency Medicine

## 2013-01-06 ENCOUNTER — Encounter (HOSPITAL_COMMUNITY): Payer: Self-pay | Admitting: Emergency Medicine

## 2013-01-06 DIAGNOSIS — Z79899 Other long term (current) drug therapy: Secondary | ICD-10-CM | POA: Insufficient documentation

## 2013-01-06 DIAGNOSIS — R220 Localized swelling, mass and lump, head: Secondary | ICD-10-CM | POA: Insufficient documentation

## 2013-01-06 DIAGNOSIS — K089 Disorder of teeth and supporting structures, unspecified: Secondary | ICD-10-CM | POA: Insufficient documentation

## 2013-01-06 DIAGNOSIS — N9089 Other specified noninflammatory disorders of vulva and perineum: Secondary | ICD-10-CM

## 2013-01-06 DIAGNOSIS — Z792 Long term (current) use of antibiotics: Secondary | ICD-10-CM | POA: Insufficient documentation

## 2013-01-06 DIAGNOSIS — N9489 Other specified conditions associated with female genital organs and menstrual cycle: Secondary | ICD-10-CM | POA: Insufficient documentation

## 2013-01-06 DIAGNOSIS — K0889 Other specified disorders of teeth and supporting structures: Secondary | ICD-10-CM

## 2013-01-06 DIAGNOSIS — N898 Other specified noninflammatory disorders of vagina: Secondary | ICD-10-CM | POA: Insufficient documentation

## 2013-01-06 DIAGNOSIS — Z880 Allergy status to penicillin: Secondary | ICD-10-CM | POA: Insufficient documentation

## 2013-01-06 DIAGNOSIS — K029 Dental caries, unspecified: Secondary | ICD-10-CM | POA: Insufficient documentation

## 2013-01-06 DIAGNOSIS — F17200 Nicotine dependence, unspecified, uncomplicated: Secondary | ICD-10-CM | POA: Insufficient documentation

## 2013-01-06 DIAGNOSIS — Z3202 Encounter for pregnancy test, result negative: Secondary | ICD-10-CM | POA: Insufficient documentation

## 2013-01-06 DIAGNOSIS — N949 Unspecified condition associated with female genital organs and menstrual cycle: Secondary | ICD-10-CM | POA: Insufficient documentation

## 2013-01-06 DIAGNOSIS — R300 Dysuria: Secondary | ICD-10-CM | POA: Insufficient documentation

## 2013-01-06 DIAGNOSIS — E119 Type 2 diabetes mellitus without complications: Secondary | ICD-10-CM | POA: Insufficient documentation

## 2013-01-06 LAB — URINALYSIS, ROUTINE W REFLEX MICROSCOPIC
Ketones, ur: NEGATIVE mg/dL
Nitrite: NEGATIVE
Protein, ur: NEGATIVE mg/dL
Urobilinogen, UA: 0.2 mg/dL (ref 0.0–1.0)

## 2013-01-06 LAB — URINE MICROSCOPIC-ADD ON

## 2013-01-06 LAB — POCT PREGNANCY, URINE: Preg Test, Ur: NEGATIVE

## 2013-01-06 MED ORDER — CLINDAMYCIN HCL 150 MG PO CAPS: 300.0000 mg | ORAL_CAPSULE | Freq: Three times a day (TID) | ORAL | Status: DC

## 2013-01-06 MED ORDER — HYDROCODONE-ACETAMINOPHEN 5-325 MG PO TABS: 1.0000 | ORAL_TABLET | Freq: Four times a day (QID) | ORAL | Status: DC | PRN

## 2013-01-06 MED ORDER — OXYCODONE-ACETAMINOPHEN 5-325 MG PO TABS
2.0000 | ORAL_TABLET | Freq: Once | ORAL | Status: AC
  Administered 2013-01-06: 2 via ORAL
  Filled 2013-01-06: qty 2

## 2013-01-06 MED ORDER — METRONIDAZOLE 0.75 % VA GEL: 1.0000 | Freq: Two times a day (BID) | VAGINAL | Status: DC

## 2013-01-06 NOTE — ED Provider Notes (Signed)
CSN: [id number]     Arrival date & time 01/06/13  1191 History   First MD Initiated Contact with Patient 01/06/13 [phone]     Chief Complaint  Patient presents with  . Vaginal Itching  . Dental Pain   (Consider location/radiation/quality/duration/timing/severity/associated sxs/prior Treatment) HPI Comments: Patient is a 47 y/o female who presents for vaginal pain and itching for the last few days. Patient states that symptoms are worse with palpation to the area and without any alleviating factors. Pain is nonradiating. She denies associated fevers, N/V, abdominal pain, abnormal vaginal bleeding, pelvic pain, melena, hematochezia, and urinary symptoms.  Patient also with secondary complaint of dental pain. Pain is associated with patient's poor dentition. Pain radiates mildly up L side of face and she states she has notice some intermittent swelling of her L cheek. Patient denies associated oral lesions or bleeding, dental trauma, ear discharge, neck pain or stiffness, and inability to open her jaw, difficulty swallowing or drooling, and shortness of breath.  The history is provided by the patient. No language interpreter was used.    Past Medical History  Diagnosis Date  . Diabetes mellitus    Past Surgical History  Procedure Laterality Date  . Other surgical history      3 c-sections   History reviewed. No pertinent family history. History  Substance Use Topics  . Smoking status: Current Every Day Smoker    Types: Cigarettes  . Smokeless tobacco: Not on file  . Alcohol Use: No   OB History   Grav Para Term Preterm Abortions TAB SAB Ect Mult Living                 Review of Systems  Constitutional: Negative for fever.  HENT: Positive for dental problem. Negative for drooling, facial swelling, sore throat and trouble swallowing.   Genitourinary: Positive for dysuria (mild, resolved). Negative for frequency, hematuria, vaginal bleeding, vaginal discharge and menstrual problem.    Musculoskeletal: Negative for neck pain and neck stiffness.  All other systems reviewed and are negative.    Allergies  Penicillins  Home Medications   Current Outpatient Rx  Name  Route  Sig  Dispense  Refill  . acetaminophen (TYLENOL) 500 MG tablet   Oral   Take 1,500-2,000 mg by mouth every 6 (six) hours as needed for pain.         [name] Kitchen glyBURIDE-metformin (GLUCOVANCE) 5-500 MG per tablet   Oral   Take 1 tablet by mouth 2 (two) times daily with a meal.   60 tablet   1   . ibuprofen (ADVIL,MOTRIN) 200 MG tablet   Oral   Take 400 mg by mouth every 6 (six) hours as needed for pain.         . clindamycin (CLEOCIN) 150 MG capsule   Oral   Take 2 capsules (300 mg total) by mouth 3 (three) times daily. May dispense as 150mg  capsules   60 capsule   0   . HYDROcodone-acetaminophen (NORCO/VICODIN) 5-325 MG per tablet   Oral   Take 1 tablet by mouth every 6 (six) hours as needed for pain.   11 tablet   0   . metroNIDAZOLE (METROGEL VAGINAL) 0.75 % vaginal gel   Vaginal   Place 1 Applicatorful vaginally 2 (two) times daily.   70 g   0    BP 140/90  Pulse 74  Temp(Src) 98.2 F (36.8 C)  Resp 18  Ht 5' (1.524 m)  Wt 180 lb (81.647 kg)  BMI 35.15 kg/m2  SpO2 98%  LMP 01/06/2013  Physical Exam  Nursing note and vitals reviewed. Constitutional: She appears well-developed and well-nourished. No distress.  HENT:  Head: Normocephalic and atraumatic.  Right Ear: External ear normal. No mastoid tenderness.  Left Ear: External ear normal. No mastoid tenderness.  Nose: Nose normal.  Mouth/Throat: Uvula is midline and mucous membranes are normal. No oral lesions. No trismus in the jaw. Abnormal dentition. Dental caries present. No uvula swelling.    Airway patent and uvula midline. Patient tolerating secretions without difficulty. No oral bleeding or lesions appreciated. No trismus or stridor. No area of fluctuance to suspect drainable dental abscess.  Abdominal:  Soft. She exhibits no distension. There is no tenderness.  Genitourinary: Uterus normal. There is tenderness on the right labia. There is no rash, lesion or injury on the right labia. There is no rash, tenderness, lesion or injury on the left labia. Cervix exhibits discharge (Bloody, consistent with menses). Cervix exhibits no motion tenderness and no friability. Right adnexum displays no mass, no tenderness and no fullness. Left adnexum displays no mass, no tenderness and no fullness. No erythema, tenderness or bleeding around the vagina. No foreign body around the vagina. No signs of injury around the vagina. Vaginal discharge (Bloody, consistent with menses) found.  No area of induration, heat-to-touch, or erythema. No weeping or drainage appreciated. No evidence of labial abscess or abscess of Bartholin's cyst.   Skin: She is not diaphoretic.    ED Course  Procedures (including critical care time) Labs Review Labs Reviewed  WET PREP, GENITAL - Abnormal; Notable for the following:    Clue Cells Wet Prep HPF POC MODERATE (*)    WBC, Wet Prep HPF POC FEW (*)    All other components within normal limits  URINALYSIS, ROUTINE W REFLEX MICROSCOPIC - Abnormal; Notable for the following:    APPearance CLOUDY (*)    Specific Gravity, Urine 1.036 (*)    Glucose, UA >1000 (*)    Hgb urine dipstick LARGE (*)    Leukocytes, UA SMALL (*)    All other components within normal limits  URINE MICROSCOPIC-ADD ON - Abnormal; Notable for the following:    Squamous Epithelial / LPF FEW (*)    All other components within normal limits  GC/CHLAMYDIA PROBE AMP  POCT PREGNANCY, URINE   Imaging Review No results found.  MDM   1. Labia irritation   2. Pain, dental    47 year old female seen multiple times in this ED for pain complaints, presents for vaginal pain and itching as well as left lower dental pain. Patient as well and nontoxic appearing, hemodynamically stable, and afebrile. Physical exam  findings as above. Patient currently menstruating. No labial or Bartholin's cyst abscesses appreciated. GU exam largely unremarkable and urinalysis nonsuggestive of infection. Possible that symptoms are secondary to labial irritation. Have suggested to the patient that she follow up with her OB/GYN regarding her symptoms. Patient verbalizes understanding.  Dental pain also appreciated to the left lower first molar. Airway patent and patient tolerating secretions without difficulty. There is no area of fluctuance to suspect a drainable dental abscess. No facial swelling or trismus. No red flags or signs concerning for Ludwig's angina. Patient appropriate for discharge with oral surgery followup. Have prescribed clindamycin to cover for infection as patient is allergic to penicillin. Short course of Norco given for pain control. Return precautions discussed and patient agreeable to plan with no unaddressed concerns.    Antony Madura, PA-C 01/12/13  0736 

## 2013-01-06 NOTE — ED Notes (Signed)
Per pt left sided dental pain. St also vaginal itching and severe pain. sts she cant sleep.

## 2013-01-07 NOTE — ED Provider Notes (Addendum)
MSE was initiated and I personally evaluated the patient and placed orders (if any) at  7:58 AM on January 07, 2013.  The patient appears stable so that the remainder of the MSE may be completed by another provider.     Roney Marion, MD 01/07/13 0981  Roney Marion, MD 01/07/13 0800

## 2013-01-12 NOTE — ED Provider Notes (Signed)
Medical screening examination/treatment/procedure(s) were performed by non-physician practitioner and as supervising physician I was immediately available for consultation/collaboration.   Roney Marion, MD 01/12/13 [phone]

## 2013-01-29 ENCOUNTER — Encounter (HOSPITAL_COMMUNITY): Payer: Self-pay | Admitting: Emergency Medicine

## 2013-01-29 DIAGNOSIS — Z79899 Other long term (current) drug therapy: Secondary | ICD-10-CM | POA: Insufficient documentation

## 2013-01-29 DIAGNOSIS — Z880 Allergy status to penicillin: Secondary | ICD-10-CM | POA: Insufficient documentation

## 2013-01-29 DIAGNOSIS — R071 Chest pain on breathing: Secondary | ICD-10-CM | POA: Insufficient documentation

## 2013-01-29 DIAGNOSIS — F17200 Nicotine dependence, unspecified, uncomplicated: Secondary | ICD-10-CM | POA: Insufficient documentation

## 2013-01-29 DIAGNOSIS — E119 Type 2 diabetes mellitus without complications: Secondary | ICD-10-CM | POA: Insufficient documentation

## 2013-01-29 NOTE — ED Notes (Addendum)
Pt. reports " knot" at upper abdomen ( ? hernia ) , pain with palpation and certain position worse when lying , no SOB / nausea or injury.

## 2013-01-30 ENCOUNTER — Emergency Department (HOSPITAL_COMMUNITY)
Admission: EM | Admit: 2013-01-30 | Discharge: 2013-01-30 | Disposition: A | Discharge status: Home / Self Care | Payer: PRIVATE HEALTH INSURANCE | Attending: Emergency Medicine | Admitting: Emergency Medicine

## 2013-01-30 ENCOUNTER — Emergency Department (HOSPITAL_COMMUNITY): Payer: PRIVATE HEALTH INSURANCE

## 2013-01-30 MED ORDER — HYDROCODONE-ACETAMINOPHEN 5-325 MG PO TABS
2.0000 | ORAL_TABLET | Freq: Once | ORAL | Status: AC
  Administered 2013-01-30: 2 via ORAL
  Filled 2013-01-30: qty 2

## 2013-01-30 MED ORDER — OXYCODONE-ACETAMINOPHEN 5-325 MG PO TABS: 2.0000 | ORAL_TABLET | Freq: Four times a day (QID) | ORAL | Status: DC | PRN

## 2013-01-30 MED ORDER — IBUPROFEN 800 MG PO TABS: 800.0000 mg | ORAL_TABLET | Freq: Three times a day (TID) | ORAL | Status: DC

## 2013-01-30 MED ORDER — IBUPROFEN 800 MG PO TABS
800.0000 mg | ORAL_TABLET | Freq: Once | ORAL | Status: AC
  Administered 2013-01-30: 800 mg via ORAL
  Filled 2013-01-30: qty 1

## 2013-01-30 NOTE — ED Provider Notes (Addendum)
CSN: [id number]     Arrival date & time 01/29/13  2244 History   First MD Initiated Contact with Patient 01/30/13 0052     Chief Complaint  Patient presents with  . Cyst   (Consider location/radiation/quality/duration/timing/severity/associated sxs/prior Treatment) The history is provided by the patient.  Brandi Calderon is a 47 y.o. female hx of DM here with rib pain. She noticed that she had anterior rib pain for the last week or so. Worse in the lower part of the sternum. It is worse when she moves as well as when she sits up. She has trouble taking a deep breath due to pain but denies any shortness of breath at rest. Denies any fevers or chills. She saw a doctor was told she may have a hernia so told to come here for evaluation. Denies any nausea vomiting or abdominal pain.    Past Medical History  Diagnosis Date  . Diabetes mellitus    Past Surgical History  Procedure Laterality Date  . Other surgical history      3 c-sections  . Cesarean section     No family history on file. History  Substance Use Topics  . Smoking status: Current Every Day Smoker    Types: Cigarettes  . Smokeless tobacco: Not on file  . Alcohol Use: No   OB History   Grav Para Term Preterm Abortions TAB SAB Ect Mult Living                 Review of Systems  Musculoskeletal:       Rib pain   All other systems reviewed and are negative.    Allergies  Penicillins  Home Medications   Current Outpatient Rx  Name  Route  Sig  Dispense  Refill  . ferrous sulfate 325 (65 FE) MG tablet   Oral   Take 325 mg by mouth daily with breakfast.         . glyBURIDE-metformin (GLUCOVANCE) 5-500 MG per tablet   Oral   Take 1 tablet by mouth 2 (two) times daily with a meal.   60 tablet   1    BP 145/78  Pulse 76  Temp(Src) 98.1 F (36.7 C) (Oral)  Resp 14  Wt 190 lb (86.183 kg)  BMI 37.11 kg/m2  SpO2 98%  LMP 01/13/2013 Physical Exam  Nursing note and vitals reviewed. Constitutional: She  is oriented to person, place, and time.  Uncomfortable   HENT:  Head: Normocephalic.  Mouth/Throat: Oropharynx is clear and moist.  Eyes: Conjunctivae are normal. Pupils are equal, round, and reactive to light.  Neck: Normal range of motion.  Cardiovascular: Normal rate, regular rhythm and normal heart sounds.   Pulmonary/Chest: Effort normal and breath sounds normal.  Tenderness on lower sternum. Reproducible tenderness along R lower ribs as well   Abdominal: Soft. Bowel sounds are normal. She exhibits no distension. There is no tenderness. There is no rebound and no guarding.  Musculoskeletal: Normal range of motion. She exhibits no edema and no tenderness.  Neurological: She is alert and oriented to person, place, and time.  Skin: Skin is warm and dry.  Psychiatric: She has a normal mood and affect. Her behavior is normal. Judgment and thought content normal.    ED Course  Procedures (including critical care time) Labs Review Labs Reviewed - No data to display Imaging Review Dg Chest 2 View  01/30/2013   CLINICAL DATA:  Chest pain  EXAM: CHEST  2 VIEW  COMPARISON:  None.  FINDINGS: The heart size and mediastinal contours are within normal limits. Both lungs are clear of edema or consolidation. No effusion or pneumothorax. The visualized skeletal structures are unremarkable.  IMPRESSION: No evidence of active cardiopulmonary disease.   Electronically Signed   By: Tiburcio Pea M.D.   On: 01/30/2013 02:11    EKG Interpretation   None       MDM  No diagnosis found. Brandi Calderon is a 47 y.o. female here with rib pain. Likely costochondritis. I doubt PE and she is PERC neg. I don't think she has ACS or dissection. Didn't palpate any hernias currently. CXR showed no obvious fractures. Will d/c home with short course of pain meds.     Richardean Canal, MD 01/30/13 0230  Richardean Canal, MD 01/30/13 [phone]

## 2013-11-27 ENCOUNTER — Emergency Department (HOSPITAL_COMMUNITY)
Admission: EM | Admit: 2013-11-27 | Discharge: 2013-11-27 | Disposition: A | Discharge status: Home / Self Care | Payer: PRIVATE HEALTH INSURANCE | Attending: Emergency Medicine | Admitting: Emergency Medicine

## 2013-11-27 ENCOUNTER — Encounter (HOSPITAL_COMMUNITY): Payer: Self-pay | Admitting: Emergency Medicine

## 2013-11-27 DIAGNOSIS — K089 Disorder of teeth and supporting structures, unspecified: Secondary | ICD-10-CM | POA: Diagnosis present

## 2013-11-27 DIAGNOSIS — E119 Type 2 diabetes mellitus without complications: Secondary | ICD-10-CM | POA: Diagnosis not present

## 2013-11-27 DIAGNOSIS — Z880 Allergy status to penicillin: Secondary | ICD-10-CM | POA: Insufficient documentation

## 2013-11-27 DIAGNOSIS — K0510 Chronic gingivitis, plaque induced: Secondary | ICD-10-CM | POA: Diagnosis not present

## 2013-11-27 MED ORDER — OXYCODONE-ACETAMINOPHEN 5-325 MG PO TABS: ORAL_TABLET | ORAL | Status: DC

## 2013-11-27 MED ORDER — METRONIDAZOLE 500 MG PO TABS: 500.0000 mg | ORAL_TABLET | Freq: Three times a day (TID) | ORAL | Status: DC

## 2013-11-27 NOTE — ED Provider Notes (Signed)
  Chief Complaint   Chief Complaint  Patient presents with  . Dental Problem    History of Present Illness   Brandi Calderon is a 48 year old female who has a long-standing history of dental problems with gingivitis and loosening of her teeth. All of her lower frontal teeth are wobbly and hurting. Her top left first incisor sometimes feels like it's about to pop out. There is no swelling of the gingiva, no swelling of the floor the mouth of the tongue. She has no trouble swallowing or breathing. No fever, chills, or headache.  Review of Systems   Other than as noted above, the patient denies any of the following symptoms: Systemic:  No fever or chills. ENT:  No headache, ear ache, sore throat, nasal congestion, facial pain, or swelling. Lymphatic:  No adenopathy. Lungs:  No coughing or shortness of breath.  [place]   Past medical history, family history, social history, meds, and allergies were reviewed. She is allergic to penicillin Vicodin. She has diabetes and takes Glucovance.  Physical Examination     Vital signs:  BP 134/78  Pulse 69  Temp(Src) 98.7 F (37.1 C)  Resp 12  SpO2 97% General:  Alert, oriented, in no distress. ENT:  TMs and canals normal.  Nasal mucosa normal. Mouth exam:  Her teeth are patent with calculus, and the roots were exposed with severe gingivitis. All of her teeth are wobbly and about to fall out. There is no swelling of the gingiva, no swelling of the floor the mouth of the tongue, the airway is clear and widely patent. Neck:  No swelling or adenopathy. Lungs:  Breath sounds clear and equal bilaterally.  No wheezes, rales or rhonchi. Heart:  Regular rhythm.  No gallops or murmers. Skin:  Clear, warm and dry.   Assessment   The encounter diagnosis was Gingivitis.  No evidence of Ludwig's angina.  She has severe gingival disease but loose to lower teeth. They all need to be removed. She was referred to our dentist on call today.  Plan   1.   Meds:  The following meds were prescribed:   Discharge Medication List as of 11/27/2013  4:50 PM    START taking these medications   Details  metroNIDAZOLE (FLAGYL) 500 MG tablet Take 1 tablet (500 mg total) by mouth 3 (three) times daily., Starting 11/27/2013, Until Discontinued, Print    oxyCODONE-acetaminophen (PERCOCET) 5-325 MG per tablet 1 to 2 tablets every 6 hours as needed for pain., Print        2.  Patient Education/Counseling:  The patient was given appropriate handouts, self care instructions, and instructed in symptomatic relief. Suggested sleeping with head of bed elevated and hot salt water mouthwash.   3.  Follow up:  The patient was told to follow up if no better in 3 to 4 days, if becoming worse in any way, and given some red flag symptoms such as difficulty swallowing or breathing which would prompt immediate return.  Follow up with a dentist as soon as posssible.     Harden Mo, MD 11/27/13 2049

## 2013-11-27 NOTE — ED Notes (Signed)
Pt reports a Hx of gum DX. Pt reports her front teeth are loose due to gum DX.

## 2013-11-27 NOTE — ED Notes (Signed)
Declined W/C at D/C and was escorted to lobby by RN. 

## 2013-11-27 NOTE — Discharge Instructions (Signed)
Gingivitis °Gingivitis is a form of gum (periodontal) disease that causes redness, soreness, and swelling (inflammation) of your gums. °CAUSES °The most common cause of gingivitis is poor oral hygiene. A sticky substance made of bacteria, mucus, and food particles (plaque), is deposited on the exposed part of teeth. As plaque builds up, it reacts with the saliva in your mouth to form something called  tartar. Tartar is a hard deposit that becomes trapped around the base of the tooth. Plaque and tartar irritate the gums, leading to the formation of gingivitis. Other factors that increase your risk for gingivitis include:  °· Tobacco use. °· Diabetes. °· Older age. °· Certain medications. °· Certain viral or fungal infections. °· Dry mouth. °· Hormonal changes such as during pregnancy. °· Poor nutrition. °· Substance abuse. °· Poor fitting dental restorations or appliances. °SYMPTOMS °You may notice inflammation of the soft tissue (gingiva) around the teeth. When these tissues become inflamed, they bleed easily, especially during flossing or brushing. The gums may also be:  °· Tender to the touch. °· Bright red, purple red, or have a shiny appearance. °· Swollen. °· Wearing away from the teeth (receding), which exposes more of the tooth. °Bad breath is often present. Continued infection around teeth can eventually cause cavities and loosen teeth. This may lead to eventual tooth loss. °DIAGNOSIS °A medical and dental history will be taken. Your mouth, teeth, and gums will be examined. Your dentist will look for soft, swollen purple-red, irritated gums. There may be deposits of plaque and tartar at the base of the teeth. Your gums will be looked at for the degree of redness, puffiness, and bleeding tendencies. Your dentist will see if any of the teeth are loose. X-rays may be taken to see if the inflammation has spread to the supporting structures of the teeth. °TREATMENT °The goal is to reduce and reverse the  inflammation. Proper treatment can usually reverse the symptoms of gingivitis and prevent further progression of the disease. Have your teeth cleaned. During the cleaning, all plaque and tartar will be removed. Instruction for proper home care will be given. You will need regular professional cleanings and check-ups in the future. °HOME CARE INSTRUCTIONS °· Brush your teeth twice a day and floss at least once per day. When flossing, it is best to floss first then brush. °· Limit sugar between meals and maintain a well-balanced diet.  °· Even the best dental hygiene will not prevent plaque from developing. It is necessary for you to see your dentist on a regular basis for cleaning and regular checkups. °· Your dentist can recommend proper oral hygiene and mouth care and suggest special toothpastes or mouth rinses. °· Stop smoking. °SEEK DENTAL OR MEDICAL CARE IF: °· You have painful, reddened tissue around your teeth, or you have puffy swollen gums. °· You have difficulty chewing. °· You notice any loose or infected teeth. °· You have swollen glands. °· Your gums bleed easily when you brush your teeth or are very tender to the touch. °Document Released: 09/11/2000 Document Revised: 06/10/2011 Document Reviewed: 06/22/2010 °[employer]® Patient Information ©2015 [employer]. This information is not intended to replace advice given to you by your health care provider. Make sure you discuss any questions you have with your health care provider. ° °

## 2013-12-08 ENCOUNTER — Emergency Department: Payer: Self-pay | Admitting: Emergency Medicine

## 2013-12-18 ENCOUNTER — Emergency Department (HOSPITAL_COMMUNITY)
Admission: EM | Admit: 2013-12-18 | Discharge: 2013-12-18 | Disposition: A | Discharge status: Home / Self Care | Payer: PRIVATE HEALTH INSURANCE | Attending: Emergency Medicine | Admitting: Emergency Medicine

## 2013-12-18 ENCOUNTER — Encounter (HOSPITAL_COMMUNITY): Payer: Self-pay | Admitting: Emergency Medicine

## 2013-12-18 DIAGNOSIS — F17200 Nicotine dependence, unspecified, uncomplicated: Secondary | ICD-10-CM | POA: Diagnosis not present

## 2013-12-18 DIAGNOSIS — J029 Acute pharyngitis, unspecified: Secondary | ICD-10-CM | POA: Insufficient documentation

## 2013-12-18 DIAGNOSIS — K029 Dental caries, unspecified: Secondary | ICD-10-CM | POA: Diagnosis not present

## 2013-12-18 DIAGNOSIS — E119 Type 2 diabetes mellitus without complications: Secondary | ICD-10-CM | POA: Insufficient documentation

## 2013-12-18 DIAGNOSIS — K0889 Other specified disorders of teeth and supporting structures: Secondary | ICD-10-CM

## 2013-12-18 DIAGNOSIS — K089 Disorder of teeth and supporting structures, unspecified: Secondary | ICD-10-CM | POA: Diagnosis present

## 2013-12-18 MED ORDER — OXYCODONE-ACETAMINOPHEN 5-325 MG PO TABS: 1.0000 | ORAL_TABLET | Freq: Four times a day (QID) | ORAL | Status: DC | PRN

## 2013-12-18 MED ORDER — OXYCODONE-ACETAMINOPHEN 5-325 MG PO TABS
1.0000 | ORAL_TABLET | Freq: Once | ORAL | Status: AC
  Administered 2013-12-18: 1 via ORAL
  Filled 2013-12-18: qty 1

## 2013-12-18 NOTE — ED Notes (Signed)
Pt presents with upper and lower dental pain for the past 3 months- hx of gum dx.  Pt denies having a dentist.  Pt took Motrin at home without relief.

## 2013-12-18 NOTE — ED Provider Notes (Signed)
CSN: [id number]     Arrival date & time 12/18/13  1723 History  This chart was scribed for non-physician practitioner working with No att. providers found by Mercy Moore, ED Scribe. This patient was seen in room TR08C/TR08C and the patient's care was started at 6:57 PM.   Chief Complaint  Patient presents with  . Dental Pain    The history is provided by the patient. No language interpreter was used.   HPI Comments: Brandi Calderon is a 48 y.o. female with history of gum disease who presents to the Emergency Department complaining of intermittent upper and lower dental pain ongoing for three months. Patient reports transverse movement of her teeth. Patient reports exacerbation with biting, chewing, and some talking.Patient report treatment with Motrin and BC power without relief.  Patient's last dental visit was 1.5 years ago: ED referral. At visit she had 5 teeth extracted. Daughter took patient to same office recently and was informed that procedures would amount to $4,500, which she cannot afford. Daughter requests that her mother be referred to on-call dentist to avoid such a steep charge. Patient reports intermittent fever and sore throat. She denies inability swallowing.    Past Medical History  Diagnosis Date  . Diabetes mellitus    Past Surgical History  Procedure Laterality Date  . Other surgical history      3 c-sections  . Cesarean section     No family history on file. History  Substance Use Topics  . Smoking status: Current Every Day Smoker    Types: Cigarettes  . Smokeless tobacco: Not on file  . Alcohol Use: No   OB History   Grav Para Term Preterm Abortions TAB SAB Ect Mult Living                 Review of Systems  Constitutional: Positive for fever. Negative for chills.  HENT: Positive for dental problem and sore throat. Negative for trouble swallowing and voice change.     Allergies  Penicillins and Vicodin  Home Medications   Prior to Admission  medications   Medication Sig Start Date End Date Taking? Authorizing Provider  glyBURIDE-metformin (GLUCOVANCE) 5-500 MG per tablet Take 1 tablet by mouth 2 (two) times daily with a meal. 10/25/12   Fransico Meadow, PA-C  ibuprofen (ADVIL,MOTRIN) 800 MG tablet Take 1 tablet (800 mg total) by mouth 3 (three) times daily. 01/30/13   Wandra Arthurs, MD  metroNIDAZOLE (FLAGYL) 500 MG tablet Take 1 tablet (500 mg total) by mouth 3 (three) times daily. 11/27/13   Harden Mo, MD  Omega-3 Fatty Acids (FISH OIL PO) Take 1 capsule by mouth daily.    Historical Provider, MD  oxyCODONE-acetaminophen (PERCOCET) 5-325 MG per tablet 1 to 2 tablets every 6 hours as needed for pain. 11/27/13   Harden Mo, MD  oxyCODONE-acetaminophen (PERCOCET) 5-325 MG per tablet Take 1-2 tablets by mouth every 6 (six) hours as needed. 12/18/13   Carrie Mew, PA-C   Triage Vitals: BP 139/72  Pulse 74  Temp(Src) 98.1 F (36.7 C) (Oral)  Resp 18  SpO2 98%  LMP 11/27/2013  Physical Exam  Nursing note and vitals reviewed. Constitutional: She is oriented to person, place, and time. She appears well-developed and well-nourished. No distress.  HENT:  Head: Normocephalic and atraumatic.  Mouth/Throat: Mucous membranes are normal. No oral lesions. No trismus in the jaw. Abnormal dentition. Dental caries present. No dental abscesses or uvula swelling. No oropharyngeal exudate, posterior oropharyngeal  edema, posterior oropharyngeal erythema or tonsillar abscesses.  Poor dentition with multiple dental caries. Patient has multiple loose teeth in her central incisors, bicuspids, anterior molars. Tooth decay noted diffusely along patient's bottom teeth. No obvious abscess. Mild pain along patient's jaw without swelling, induration, cellulitis, erythema, edema.  Eyes: EOM are normal.  Neck: Neck supple.  Cardiovascular: Normal rate.   Pulmonary/Chest: Effort normal. No respiratory distress.  Musculoskeletal: Normal range of motion.   Neurological: She is alert and oriented to person, place, and time.  Skin: Skin is warm and dry.  Psychiatric: She has a normal mood and affect. Her behavior is normal.    ED Course  Procedures (including critical care time)  COORDINATION OF CARE: 7:01 PM- Pain management discussed. Plans to refer patient to dentist clinic. Patient requests to be referred to [facility]. Discussed treatment plan with patient at bedside and patient agreed to plan.   Labs Review Labs Reviewed - No data to display  Imaging Review No results found.   EKG Interpretation None      MDM   Final diagnoses:  Pain, dental    Patient here with dental pain, she has been seen multiple times in the past 3 months has a history of gum disease. Patient states she has not been able to see her dentist due to the fact that the last time she saw him he stated that she does not pay for her appointment, she is referred to him by the emergency room. She is here tonight for that, and for pain control. We will help patient's pain tonight and refer her to the dental resources on-call. No gross abscess.  Exam unconcerning for Ludwig's angina or spread of infection.  Will treat with pain medicine.  Urged patient to follow-up with dentist.    BP 150/80  Pulse 74  Temp(Src) 98.2 F (36.8 C) (Oral)  Resp 18  SpO2 99%  LMP 11/27/2013   Signed,  Dahlia Bailiff, PA-C 4:17 AM  I personally performed the services described in this documentation, which was scribed in my presence. The recorded information has been reviewed and is accurate.    Carrie Mew, PA-C 12/19/13 [phone]

## 2013-12-18 NOTE — Discharge Instructions (Signed)
Use oxycodone 1-2 tablets every 4-6 hours as needed for pain. Followup with dentist, oral surgery. Refer to resource guide below to help find a dentist. Return to the ER if you develop a fever, severe jaw pain, neck pain, neck stiffness, difficulty swallowing, drooling, difficulty opening your mouth, and difficulty breathing.  Dental Pain A tooth ache may be caused by cavities (tooth decay). Cavities expose the nerve of the tooth to air and hot or cold temperatures. It may come from an infection or abscess (also called a boil or furuncle) around your tooth. It is also often caused by dental caries (tooth decay). This causes the pain you are having. DIAGNOSIS  Your caregiver can diagnose this problem by exam. TREATMENT   If caused by an infection, it may be treated with medications which kill germs (antibiotics) and pain medications as prescribed by your caregiver. Take medications as directed.  Only take over-the-counter or prescription medicines for pain, discomfort, or fever as directed by your caregiver.  Whether the tooth ache today is caused by infection or dental disease, you should see your dentist as soon as possible for further care. SEEK MEDICAL CARE IF: The exam and treatment you received today has been provided on an emergency basis only. This is not a substitute for complete medical or dental care. If your problem worsens or new problems (symptoms) appear, and you are unable to meet with your dentist, call or return to this location. SEEK IMMEDIATE MEDICAL CARE IF:   You have a fever.  You develop redness and swelling of your face, jaw, or neck.  You are unable to open your mouth.  You have severe pain uncontrolled by pain medicine. MAKE SURE YOU:   Understand these instructions.  Will watch your condition.  Will get help right away if you are not doing well or get worse. Document Released: 03/18/2005 Document Revised: 06/10/2011 Document Reviewed: 11/04/2007 [facility]  Patient Information 2015 [place], [place]. This information is not intended to replace advice given to you by your health care provider. Make sure you discuss any questions you have with your health care provider.   Emergency Department Resource Guide 1) Find a Doctor and Pay Out of Pocket Although you won't have to find out who is covered by your insurance plan, it is a good idea to ask around and get recommendations. You will then need to call the office and see if the doctor you have chosen will accept you as a new patient and what types of options they offer for patients who are self-pay. Some doctors offer discounts or will set up payment plans for their patients who do not have insurance, but you will need to ask so you aren't surprised when you get to your appointment.  2) Contact Your Local Health Department Not all health departments have doctors that can see patients for sick visits, but many do, so it is worth a call to see if yours does. If you don't know where your local health department is, you can check in your phone book. The CDC also has a tool to help you locate your state's health department, and many state websites also have listings of all of their local health departments.  3) Find a [facility] If your illness is not likely to be very severe or complicated, you may want to try a walk in clinic. These are popping up all over the country in pharmacies, drugstores, and shopping centers. They're usually staffed by nurse practitioners or physician assistants  that have been trained to treat common illnesses and complaints. They're usually fairly quick and inexpensive. However, if you have serious medical issues or chronic medical problems, these are probably not your best option.  No Primary Care Doctor: - Call Health Connect at  [phone] - they can help you locate a primary care doctor that  accepts your insurance, provides certain services, etc. - Physician Referral Service-  [phone]  Chronic Pain Problems: Organization         Address  Phone   Notes  [facility]  [phone] Patients need to be referred by their primary care doctor.   Medication Assistance: Organization         Address  Phone   Notes  [facility] Medication [facility] [address] [place], [place] [address] [phone] --Must be a resident of [facility] -- Must have NO insurance coverage whatsoever (no Medicaid/ Medicare, etc.) -- The pt. MUST have a primary care doctor that directs their care regularly and follows them in the community   [facility]ist  [phone]   [employer]  [phone]    Agencies that provide inexpensive medical care: Organization         Address  Phone   Notes  [place]  [phone]   [facility]    [phone]   [facility] [place], [place] [address] [phone]   [place] [address], [place] [phone]   Planned Parenthood    [phone]   [facility]    [phone]   [place] and [place] [address], [place] Phone:  [phone], Fax:  [phone] Hours of Operation:  9 am - 6 pm, M-F.  Also accepts Medicaid/Medicare and self-pay.  [facility] for [address], [address], [place] Phone: [phone], Fax: [phone]. Hours of Operation:  8:30 am - 5:30 pm, M-F.  Also accepts Medicaid and self-pay.  [facility] [address] [place] Phone: [phone]   [place], [place], [place] [phone], Ext. [phone] Mondays & Thursdays: 7-9 AM.  First 15 patients are seen on a first come, first serve basis.    Section Providers:  Organization         Address  Phone   Notes  [facility] [address],  [place] [phone] Also accepts self-pay patients.  [facility] [address], [place]  [phone]   [place], [address], [place] [phone]   [facility] [address], [place] [phone]   Lucianne Lei [address], [place]   [phone] Only accepts [place] Access [place] patients after they have their name applied to their card.   Self-Pay (no insurance) in [facility]:  Organization         Address  Phone   Notes  Sickle Cell Patients, [facility] [place] [phone]   [facility] Urgent Care [place] [phone]   [facility] [place], [address], [place] [phone]   [facility]/Dr. Osei-Bonsu  [address],  [facility] or [place], [address], [place] [phone] Phone number for both [place] and [place] locations is the same.  [facility]gent [facility] and [facility] [address], [place] [phone]   [facility] [address], [place] or [address] [phone]   [facility] [address] [place] [phone], phone; [phone], fax Sees patients 1st and 3rd Saturday of every month.  Must not qualify for public or private insurance (i.e. Medicaid, Medicare, [place] Health Choice, Veterans' Benefits)  Household income should be no more than 200% of the poverty level The clinic cannot treat you if you are pregnant or think you are pregnant  Sexually transmitted diseases are not treated at the clinic.    Dental Care: Organization         Address  Phone  Notes  [facility] Department of [facility] [phone] Accepts children up to age 36 who are enrolled in  [place] or [place]; pregnant women with a Medicaid card; and children who have applied for Medicaid or [place] Health Choice, but were declined, whose parents can pay a reduced fee at time of service.  [facility] Department of [facility]  [address], [place] [phone] Accepts children up to age 36 who are enrolled in [place] or [place]; pregnant women with a Medicaid card; and children who have applied for Medicaid or [place] Health Choice, but were declined, whose parents can pay a reduced fee at time of service.  [place] [facility] Access PRO[facility]  [place] [phone] Patients are seen by appointment only. Walk-ins are not accepted. [place] will see patients 57 years of age and older. Monday - Tuesday (8am-5pm) Most Wednesdays (8:30-5pm) $30 per visit, cash only  [facility]  [address], [facility] [phone] Patients are seen by appointment only. Walk-ins are not accepted. [place] will see patients 54 years of age and older. One Wednesday Evening (Monthly: Volunteer Based).  $30 per visit, cash only  Micro  [phone] for adults; Children under age 65, call Graduate Pediatric Dentistry at [phone]. Children aged 63-14, please call [phone] to request a pediatric application.  Dental services are provided in all areas of dental care including fillings, crowns and bridges, complete and partial dentures, implants, gum treatment, root canals, and extractions. Preventive care is also provided. Treatment is provided to both adults and children. Patients are selected via a lottery and there is often a waiting list.   [facility] [address] [place]  [phone] [email or web address]   [facility] [address] [place], [place] [phone], Ext. [phone] Second and Fourth Thursday of each month, opens at 6:30  AM; Clinic ends at 9 AM.  Patients are seen on a first-come first-served basis, and a limited number are seen during each clinic.   [facility]  [address] Hillard Danker [place], [place] [phone]   Eligibility Requirements You must have lived in [place], [place], or [place] counties for at least the last three months.   You cannot be eligible for state or federal sponsored [employer], including [employer], [place], or [employer].   You generally cannot be eligible for healthcare insurance through your employer.    How to apply:  Eligibility screenings are held every Tuesday and Wednesday afternoon from 1:00 pm until 4:00 pm. You do not need an appointment for the interview!  [facility] [address] [place], [place]   [place]  [place] Department  [place]  [phone]    Behavioral Health Resources in the Community: Intensive Outpatient Programs Organization         Address  Phone  Notes  [place]. [address] [place], [place] [phone]   [facility] [address], [place], [place]   ADS: Alcohol & Drug Svcs [address], [place], [place]   [place] [address]  [place], [place] or [phone]   Substance Abuse Resources Organization         Address  Phone  Notes  Alcohol [facility] Drug Services  [phone]   [place]  [phone]   [facility] [place]   Chinita Pester  [phone]   Residential & Outpatient Substance Abuse Program  [phone]   Psychological Services Organization         Address  Phone  Notes  [facility]  [place]  [phone]   [place] [address] [place] or  [phone]    Mobile Crisis Teams Organization         Address  Phone  Notes  [facility], Mobile Crisis Care Unit  [phone]   [facility]  [address] [place], [place]   [place] Levels [address] [place] [phone]    Self-Help/Support Groups Organization         Address  Phone             Notes  [place]. of [place] - variety of support groups  [place] Call for more information  Narcotics Anonymous (NA), [facility] [address], [employer] [place]  2 meetings at this location   Special educational needs teacher         Address  Phone  Notes  [facility] Residential Treatment [place],    [place]  [phone]   [facility]  [address] [place] [address], [place], [place]   [place], [place] [phone] Admissions: 8am-3pm M-F  Incentives Substance [place] [address]    [place], [place] [phone]   [facility] [address] [place], [place], [place]   [facility] [address],  [place], [place]   [facility] - Intensive Outpatient [address]een [address], [place], [place]   [facility] ([place] [address] [place]  [place], [place] 1-5052157128 or [phone]   [facility] (RTS) [address] [place], [place] Accepts Medicaid  [facility] [address]  [place] [phone] Substance Abuse/Addiction Treatment   [facility] Organization         Address  Phone  Notes  [facility]  [phone]   Domenic Schwab, PhD [address] Arlis Porta [place], [place]   [phone] or [phone]   [place], [place] [phone]   [place] [address],  [place], [place] [phone] Insurance/Medicaid/sponsorship through [facility] and Families [address] [place], [place] [phone] Therapy/tele-psych/case  [facility] [place], [place] [phone]    Dr. Adele Schilder  [phone]   [facility] Dept. 1) [address] [place] 2) [place] 3)  [address], [place] [phone]  [phone]   [place] [phone] or [phone] (After Hours)

## 2013-12-19 NOTE — ED Provider Notes (Signed)
Medical screening examination/treatment/procedure(s) were performed by non-physician practitioner and as supervising physician I was immediately available for consultation/collaboration.   EKG Interpretation None        Houston Siren III, MD 12/19/13 1505

## 2014-04-16 ENCOUNTER — Encounter (HOSPITAL_COMMUNITY): Payer: Self-pay | Admitting: Cardiology

## 2014-04-16 ENCOUNTER — Emergency Department (HOSPITAL_COMMUNITY)
Admission: EM | Admit: 2014-04-16 | Discharge: 2014-04-16 | Discharge status: Against Medical Advice | Payer: PRIVATE HEALTH INSURANCE | Attending: Emergency Medicine | Admitting: Emergency Medicine

## 2014-04-16 DIAGNOSIS — Z720 Tobacco use: Secondary | ICD-10-CM | POA: Diagnosis not present

## 2014-04-16 DIAGNOSIS — E119 Type 2 diabetes mellitus without complications: Secondary | ICD-10-CM | POA: Diagnosis not present

## 2014-04-16 DIAGNOSIS — R102 Pelvic and perineal pain: Secondary | ICD-10-CM | POA: Insufficient documentation

## 2014-04-16 LAB — URINE MICROSCOPIC-ADD ON

## 2014-04-16 LAB — URINALYSIS, ROUTINE W REFLEX MICROSCOPIC
Bilirubin Urine: NEGATIVE
Hgb urine dipstick: NEGATIVE
Ketones, ur: NEGATIVE mg/dL
Leukocytes, UA: NEGATIVE
NITRITE: POSITIVE — AB
PH: 6.5 (ref 5.0–8.0)
Protein, ur: NEGATIVE mg/dL
SPECIFIC GRAVITY, URINE: 1.035 — AB (ref 1.005–1.030)
Urobilinogen, UA: 1 mg/dL (ref 0.0–1.0)

## 2014-04-16 NOTE — ED Notes (Signed)
Pt reports that she has been having vaginal pain and burning for the past week. Also has burning with urination. Pt reports that she is not currently sexually active.

## 2014-04-16 NOTE — ED Notes (Signed)
The pt told staff she was leaving and would return later when she had more time. Staff saw her leave the ED with family.

## 2014-04-21 ENCOUNTER — Emergency Department (HOSPITAL_COMMUNITY)
Admission: EM | Admit: 2014-04-21 | Discharge: 2014-04-21 | Disposition: A | Discharge status: Home / Self Care | Payer: PRIVATE HEALTH INSURANCE | Attending: Emergency Medicine | Admitting: Emergency Medicine

## 2014-04-21 ENCOUNTER — Encounter (HOSPITAL_COMMUNITY): Payer: Self-pay | Admitting: *Deleted

## 2014-04-21 DIAGNOSIS — M5442 Lumbago with sciatica, left side: Secondary | ICD-10-CM

## 2014-04-21 DIAGNOSIS — Z791 Long term (current) use of non-steroidal anti-inflammatories (NSAID): Secondary | ICD-10-CM | POA: Insufficient documentation

## 2014-04-21 DIAGNOSIS — Z79899 Other long term (current) drug therapy: Secondary | ICD-10-CM | POA: Diagnosis not present

## 2014-04-21 DIAGNOSIS — E119 Type 2 diabetes mellitus without complications: Secondary | ICD-10-CM | POA: Diagnosis not present

## 2014-04-21 DIAGNOSIS — M5441 Lumbago with sciatica, right side: Secondary | ICD-10-CM

## 2014-04-21 DIAGNOSIS — M5430 Sciatica, unspecified side: Secondary | ICD-10-CM | POA: Insufficient documentation

## 2014-04-21 DIAGNOSIS — J069 Acute upper respiratory infection, unspecified: Secondary | ICD-10-CM | POA: Diagnosis not present

## 2014-04-21 DIAGNOSIS — M791 Myalgia: Secondary | ICD-10-CM | POA: Insufficient documentation

## 2014-04-21 DIAGNOSIS — Z880 Allergy status to penicillin: Secondary | ICD-10-CM | POA: Diagnosis not present

## 2014-04-21 DIAGNOSIS — Z9889 Other specified postprocedural states: Secondary | ICD-10-CM | POA: Insufficient documentation

## 2014-04-21 DIAGNOSIS — Z720 Tobacco use: Secondary | ICD-10-CM | POA: Diagnosis not present

## 2014-04-21 DIAGNOSIS — M545 Low back pain: Secondary | ICD-10-CM | POA: Diagnosis present

## 2014-04-21 MED ORDER — NAPROXEN 250 MG PO TABS
500.0000 mg | ORAL_TABLET | Freq: Once | ORAL | Status: AC
  Administered 2014-04-21: 500 mg via ORAL
  Filled 2014-04-21: qty 2

## 2014-04-21 MED ORDER — NAPROXEN 500 MG PO TABS: 500.0000 mg | ORAL_TABLET | Freq: Two times a day (BID) | ORAL | Status: DC

## 2014-04-21 MED ORDER — HYDROMORPHONE HCL 1 MG/ML IJ SOLN
2.0000 mg | Freq: Once | INTRAMUSCULAR | Status: AC
  Administered 2014-04-21: 2 mg via INTRAMUSCULAR
  Filled 2014-04-21: qty 2

## 2014-04-21 MED ORDER — ONDANSETRON 4 MG PO TBDP
8.0000 mg | ORAL_TABLET | Freq: Once | ORAL | Status: AC
  Administered 2014-04-21: 8 mg via ORAL
  Filled 2014-04-21: qty 2

## 2014-04-21 MED ORDER — CYCLOBENZAPRINE HCL 10 MG PO TABS: 10.0000 mg | ORAL_TABLET | Freq: Two times a day (BID) | ORAL | Status: DC | PRN

## 2014-04-21 MED ORDER — PHENYLEPHRINE-DM-GG-APAP 5-10-200-325 MG PO CAPS: 2.0000 | ORAL_CAPSULE | Freq: Three times a day (TID) | ORAL | Status: DC

## 2014-04-21 NOTE — Discharge Instructions (Signed)
Please follow the directions provided. Be sure to follow-up with your primary care doctor for further management of this back pain. If your pain is not relieved , you may follow-up with the orthopedic doctor for further management of your back pain. Please take the naproxen twice a day to help with inflammation that's causing your pain. Please take the Flexeril twice a day to help with muscle spasms. You may take the  multisymptom cold medicine 3 times a day for relief of your nasal congestion and coughing. Don't hesitate to return for any new, worsening, or concerning symptoms.   SEEK IMMEDIATE MEDICAL CARE IF:  You have pain that radiates from your back into your legs.  You develop new bowel or bladder control problems.  You have unusual weakness or numbness in your arms or legs.  You develop nausea or vomiting.  You develop abdominal pain.  You feel faint.    Emergency Department Resource Guide 1) Find a Doctor and Pay Out of Pocket Although you won't have to find out who is covered by your insurance plan, it is a good idea to ask around and get recommendations. You will then need to call the office and see if the doctor you have chosen will accept you as a new patient and what types of options they offer for patients who are self-pay. Some doctors offer discounts or will set up payment plans for their patients who do not have insurance, but you will need to ask so you aren't surprised when you get to your appointment.  2) Contact Your Local Health Department Not all health departments have doctors that can see patients for sick visits, but many do, so it is worth a call to see if yours does. If you don't know where your local health department is, you can check in your phone book. The CDC also has a tool to help you locate your state's health department, and many state websites also have listings of all of their local health departments.  3) Find a [facility] If your illness is not  likely to be very severe or complicated, you may want to try a walk in clinic. These are popping up all over the country in pharmacies, drugstores, and shopping centers. They're usually staffed by nurse practitioners or physician assistants that have been trained to treat common illnesses and complaints. They're usually fairly quick and inexpensive. However, if you have serious medical issues or chronic medical problems, these are probably not your best option.  No Primary Care Doctor: - Call Health Connect at  [phone] - they can help you locate a primary care doctor that  accepts your insurance, provides certain services, etc. - Physician Referral Service- [phone]  Chronic Pain Problems: Organization         Address  Phone   Notes  [facility]  [phone] Patients need to be referred by their primary care doctor.   Medication Assistance: Organization         Address  Phone   Notes  [facility] Medication [facility] [address] [place], [place] [address] [phone] --Must be a resident of [facility] -- Must have NO insurance coverage whatsoever (no Medicaid/ Medicare, etc.) -- The pt. MUST have a primary care doctor that directs their care regularly and follows them in the community   Med[facility]ist  [phone]   [employer]  [phone]    Agencies that provide inexpensive medical care: Organization  Address  Phone   Notes  [place]  [phone]   [facility]    [phone]   [facility] [place], [place] [address] [phone]   [place] [address] [place], [place] [phone]   Planned Parenthood    [phone]   [facility]    [phone]   [place] and [address], [place] Phone:  [phone], Fax:  [phone] Hours of Operation:  9 am - 6 pm,  M-F.  Also accepts Medicaid/Medicare and self-pay.  [facility] for [place] [address], [address], [place] Phone: [phone], Fax: [phone]. Hours of Operation:  8:30 am - 5:30 pm, M-F.  Also accepts Medicaid and self-pay.  [facility] [place] [address] [place] Phone: [phone]   [place], [place], [place] [phone], Ext. [phone] Mondays & Thursdays: 7-9 AM.  First 15 patients are seen on a first come, first serve basis.    [place] Providers:  Organization         Address  Phone   Notes  [facility] [address], [place] [phone] Also accepts self-pay patients.  [facility] [id number] [place], [place]  [phone]   [place], [address], [place] [phone]   [facility] [address] [place] [phone]   Lucianne Lei [address], [place]   [phone] Only accepts [place] Access [place] patients after they have their name applied to their card.   Self-Pay (no insurance) in [facility]:  Organization         Address  Phone   Notes  Sickle Cell Patients, [facility] [place] [phone]   [facility] [place] [phone]   [facility] [place]  [place], [place], [place] [phone]   [facility]/Dr. Osei-Bonsu  [address], [place] or [address], [place] [phone] Phone number for both [place] and [place] locations is the same.  Urgent Medical and [facility] [address] [place] [phone]   [facility] [address], [place] or [address] [phone]   [facility] [address] [place] [phone], phone; [phone], fax Sees patients 1st and 3rd Saturday of every month.  Must not qualify for public or private insurance (i.e. Medicaid, Medicare, [place] Health Choice, Veterans' Benefits)  Household income should be no more than 200% of the poverty level The clinic cannot treat you if you are pregnant or think you are pregnant  Sexually transmitted diseases are not treated at the clinic.    Dental Care: Organization         Address  Phone  Notes  [facility] Department of [facility] [place] [phone] Accepts children up to age 36 who are enrolled in [place] or [place]; pregnant women with a Medicaid card; and children who have applied for Medicaid or [place] Health Choice, but were declined, whose parents can pay a reduced fee at time  of service.  [facility] Department of [facility]  [address], [place] [phone] Accepts children up to age 72 who are enrolled in [place] or [place]; pregnant women with a Medicaid card; and children who have applied for Medicaid or [place] Health Choice, but were declined, whose parents can pay a reduced fee at time of service.  [place] Adult Dental Access PROGRAM  Council [phone] Patients are seen by appointment only. Walk-ins are not accepted. [place] will see patients 44 years of age and older. Monday - Tuesday (8am-5pm) Most Wednesdays (8:30-5pm) $30 per visit, cash only  [facility] Adult Dental Access PROGRAM  [address], [facility] [phone] Patients are seen by appointment only. Walk-ins are not accepted. [place] will see patients 23 years of age and older. One Wednesday Evening (Monthly: Volunteer Based).  $30 per visit, cash only  [place]  [phone] for adults; Children under age 78, call Graduate Pediatric Dentistry at [phone]. Children aged 25-14, please call [phone] to request a pediatric application.  Dental services are provided in all areas of dental care including fillings, crowns and bridges, complete and partial dentures, implants, gum treatment, root canals, and extractions. Preventive care is also provided. Treatment is provided to both adults and children. Patients are selected via a lottery and there is often a waiting list.   [facility] [address] [place]  [phone] [email or web address]   [facility] [address] [place], [place] [phone], Ext. [phone] Second and Fourth Thursday of each month, opens at 6:30 AM; Clinic ends at 9 AM.  Patients are seen on a first-come first-served basis, and a limited number are seen during each clinic.   [facility]  [address] Hillard Danker [place], [place] [phone]   Eligibility Requirements You must have lived in [place], [place], or [place] counties for at least the last three months.   You cannot be eligible for state or federal sponsored [employer], including [employer], [place], or [employer].   You generally cannot be eligible for healthcare insurance through your employer.    How to apply: Eligibility screenings are held every Tuesday and Wednesday afternoon from 1:00 pm until 4:00 pm. You do not need an appointment for the interview!  [facility] [address], [place], [place]   [place]  [place] Department  [place]  [phone]    Behavioral Health Resources in the Community: Intensive Outpatient Programs Organization         Address  Phone  Notes  [place]. [address], [place], [place] [phone]   [facility] Outpatient [address] [place], [place]   ADS: Alcohol & Drug Svcs  [address] [place], [place]   [facility] [address]  [place], [place] or [phone]   Substance Abuse Resources Organization         Address  Phone  Notes  Alcohol and Drug Services  [phone]   [place]  [phone]   [place]   Chinita Pester  [phone]   Residential & Outpatient Substance Abuse Program  [phone]   Psychological Services Organization         Address  Phone  Notes  [facility]  [place]  [phone]   [place] [address], [place] or [phone]    Mobile Crisis Teams Organization         Address  Phone  Notes  [facility], Mobile Crisis Care Unit  [phone]   [facility]ive Psychotherapeutic Services  [address] [place], [place]   [place] Levels [address] [place] [phone]    Self-Help/Support Groups Organization         Address  Phone             Notes  [place]. of [place] - variety of support groups  [place] Call for more information  Narcotics Anonymous (NA), [facility] [address], [employer] [place]  2 meetings at this location   Special educational needs teacher         Address  Phone  Notes  ASAP Residential Treatment [place],    [place]  [phone]   [facility]  [address] [place] [id number], [place], [place]   [place], [place] [phone] Admissions: 8am-3pm M-F  Incentives Substance [place] [address]    [place], [place] [phone]   [facility] [address] [place], [place], [place]   [facility] [address]  [place], [place]   [facility]ight Programs - Intensive Outpatient [address], [place], [place]   [facility] [address].,  [place], [place] [phone] or [phone]   [facility] Services (RTS) [address] [place], [place] Accepts Medicaid  [facility] [place] [address]  [place] [phone] Substance Abuse/Addiction Treatment   [facility] Organization         Address  Phone  Notes  [facility]  [phone]   Domenic Schwab, PhD [address] Arlis [place], [place]   [phone] or [phone]   [place], [place] [phone]   [facility] [address], [place], [place] [phone] Insurance/Medicaid/sponsorship through [facility] and Families [address] [place]                                    [place], [place] [phone] [place] [address], [place] [phone]    Dr. Adele Schilder  [phone]   [facility] Dept. 1) [address], [place] 2) [place] 3)  [address], [place] [phone]  [phone]   [place] [phone] or [phone] (After Hours)

## 2014-04-21 NOTE — ED Provider Notes (Signed)
CSN: [id number]     Arrival date & time 04/21/14  1043 History  This chart was scribed for non-physician practitioner, Britt Bottom, NP-C, working with Hoy Morn, MD by Ladene Artist, ED Scribe. This patient was seen in room TR07C/TR07C and the patient's care was started at 11:05 AM.   Chief Complaint  Patient presents with  . Back Pain  . URI   The history is provided by the patient. No language interpreter was used.   HPI Comments: Brandi Calderon is a 49 y.o. female, with a h/o DM, who presents to the Emergency Department complaining of gradual onset of constant lower back pain onset 2-3 days ago. She reports lifting 2-3 days ago that she suspects is the cause of back pain. Pt currently rates her pain 10/10. She further reports that pain radiates down the back of bilateral legs into her knees, L worse than R. She denies fall. Pt also denies fever, urinary or bowel incontinence. No h/o CA or IV drug use. Pt reports that her DM is not well controlled at this time.  Pt also presents with persistent cough for the past 3 days. She reports associated congestion, rhinorrhea, sore throat, bilateral ear pain.    Past Medical History  Diagnosis Date  . Diabetes mellitus    Past Surgical History  Procedure Laterality Date  . Other surgical history      3 c-sections  . Cesarean section     History reviewed. No pertinent family history. History  Substance Use Topics  . Smoking status: Current Every Day Smoker    Types: Cigarettes  . Smokeless tobacco: Not on file  . Alcohol Use: No   OB History    No data available     Review of Systems  Constitutional: Negative for fever.  HENT: Positive for congestion, ear pain, rhinorrhea and sore throat.   Respiratory: Positive for cough.   Musculoskeletal: Positive for myalgias and back pain.  All other systems reviewed and are negative.  Allergies  Penicillins and Vicodin  Home Medications   Prior to Admission medications    Medication Sig Start Date End Date Taking? Authorizing Provider  acetaminophen (TYLENOL) 500 MG tablet Take 2,000 mg by mouth 3 (three) times daily as needed (pain).    Historical Provider, MD  Aspirin-Salicylamide-Caffeine (BC HEADACHE POWDER PO) Take 4 packets by mouth daily as needed (pain).    Historical Provider, MD  glyBURIDE-metformin (GLUCOVANCE) 5-500 MG per tablet Take 1 tablet by mouth 2 (two) times daily with a meal. 10/25/12   Fransico Meadow, PA-C  ibuprofen (ADVIL,MOTRIN) 200 MG tablet Take 400 mg by mouth 3 (three) times daily as needed (pain).    Historical Provider, MD  ibuprofen (ADVIL,MOTRIN) 800 MG tablet Take 1 tablet (800 mg total) by mouth 3 (three) times daily. Patient not taking: Reported on 04/16/2014 01/30/13   Wandra Arthurs, MD  metroNIDAZOLE (FLAGYL) 500 MG tablet Take 1 tablet (500 mg total) by mouth 3 (three) times daily. Patient not taking: Reported on 04/16/2014 11/27/13   Harden Mo, MD  Omega-3 Fatty Acids (FISH OIL PO) Take 1 capsule by mouth daily.    Historical Provider, MD  oxyCODONE-acetaminophen (PERCOCET) 5-325 MG per tablet 1 to 2 tablets every 6 hours as needed for pain. Patient not taking: Reported on 04/16/2014 11/27/13   Harden Mo, MD  oxyCODONE-acetaminophen (PERCOCET) 5-325 MG per tablet Take 1-2 tablets by mouth every 6 (six) hours as needed. Patient not taking: Reported on  04/16/2014 12/18/13   Carrie Mew, PA-C   Triage Vitals: BP 123/77 mmHg  Pulse 77  Temp(Src) 98.7 F (37.1 C) (Oral)  Resp 20  SpO2 100%  LMP 03/15/2014 Physical Exam  Constitutional: She is oriented to person, place, and time. She appears well-developed and well-nourished. No distress.  HENT:  Head: Normocephalic and atraumatic.  Right Ear: Tympanic membrane is bulging. Tympanic membrane is not erythematous.  Left Ear: Tympanic membrane is bulging. Tympanic membrane is not erythematous.  Mouth/Throat: Posterior oropharyngeal erythema (mild) present. No oropharyngeal  exudate.  Eyes: Conjunctivae and EOM are normal.  Neck: Neck supple.  Cardiovascular: Normal rate, regular rhythm and normal heart sounds.   Pulmonary/Chest: Effort normal and breath sounds normal.  Lungs clear bilaterally.   Musculoskeletal: Normal range of motion.  Tenderness to palpation of the L SI joint. Tenderness to palpation over bilateral paraspinous muscles. No bony tenderness. 5/5 strength of plantar and dorsal flexion.  Neurological: She is alert and oriented to person, place, and time.  Skin: Skin is warm and dry.  Psychiatric: She has a normal mood and affect. Her behavior is normal.  Nursing note and vitals reviewed.  ED Course  Procedures (including critical care time) DIAGNOSTIC STUDIES: Oxygen Saturation is 100% on RA, normal by my interpretation.    COORDINATION OF CARE: 11:15 AM-Discussed treatment plan which includes Naproxen, Dilaudid injection and follow-up with specialist with pt at bedside and pt agreed to plan.   Labs Review Labs Reviewed - No data to display  Imaging Review No results found.   EKG Interpretation None      MDM   Final diagnoses:  Bilateral low back pain with sciatica, sciatica laterality unspecified   49 yo with dual complaints of back pain and URI symptoms.  She has no red flag symptoms including no neurological deficits and normal neuro exam, no loss of bowel or bladder control, no fever, night sweats, weight loss, h/o cancer, IVDU.  She is diabetic so prednisone avoided and discussed treatment with NSAIDs, flexeril for back and multi-symptom cold medicine for URI symptoms. Symptomatic management discussed, pain medicine given in the ED. Pt is well-appearing, in no acute distress and vital signs reviewed and not concerning. She appears safe to be discharged.  Discharge include follow-up with their PCP and referral to ortho if pain persists. Return precautions provided. Pt aware of plan and in agreement.    I personally performed the  services described in this documentation, which was scribed in my presence. The recorded information has been reviewed and is accurate.  Filed Vitals:   04/21/14 1050 04/21/14 1208  BP: 123/77 100/59  Pulse: 77 86  Temp: 98.7 F (37.1 C) 97.4 F (36.3 C)  TempSrc: Oral Oral  Resp: 20 22  SpO2: 100% 99%   Meds given in ED:  Medications  HYDROmorphone (DILAUDID) injection 2 mg (2 mg Intramuscular Given 04/21/14 1145)  naproxen (NAPROSYN) tablet 500 mg (500 mg Oral Given 04/21/14 1131)  ondansetron (ZOFRAN-ODT) disintegrating tablet 8 mg (8 mg Oral Given 04/21/14 1132)    Discharge Medication List as of 04/21/2014 11:39 AM    START taking these medications   Details  cyclobenzaprine (FLEXERIL) 10 MG tablet Take 1 tablet (10 mg total) by mouth 2 (two) times daily as needed for muscle spasms., Starting 04/21/2014, Until Discontinued, Print    naproxen (NAPROSYN) 500 MG tablet Take 1 tablet (500 mg total) by mouth 2 (two) times daily., Starting 04/21/2014, Until Discontinued, Print    Phenylephrine-DM-GG-APAP (  [place] FAST-MAX) 5-10-200-325 MG CAPS Take 2 capsules by mouth 3 (three) times daily., Starting 04/21/2014, Until Discontinued, Print           Britt Bottom, NP 04/22/14 [place], MD 04/22/14 [phone]

## 2014-04-21 NOTE — ED Notes (Signed)
Pt in c/o lower back pain that radiates down her legs, states she thinks she pulled something after lifting a heavy object a few days ago, also reports cough and congestion with body aches

## 2014-05-13 ENCOUNTER — Emergency Department: Payer: Self-pay | Admitting: Emergency Medicine

## 2014-10-28 ENCOUNTER — Telehealth: Payer: Self-pay | Admitting: Family Medicine

## 2015-02-21 ENCOUNTER — Emergency Department: Payer: PRIVATE HEALTH INSURANCE

## 2015-02-21 ENCOUNTER — Encounter: Payer: Self-pay | Admitting: Emergency Medicine

## 2015-02-21 ENCOUNTER — Emergency Department
Admission: EM | Admit: 2015-02-21 | Discharge: 2015-02-21 | Disposition: A | Discharge status: Home / Self Care | Payer: PRIVATE HEALTH INSURANCE | Attending: Emergency Medicine | Admitting: Emergency Medicine

## 2015-02-21 DIAGNOSIS — M7918 Myalgia, other site: Secondary | ICD-10-CM

## 2015-02-21 DIAGNOSIS — M542 Cervicalgia: Secondary | ICD-10-CM | POA: Insufficient documentation

## 2015-02-21 DIAGNOSIS — Z91199 Patient's noncompliance with other medical treatment and regimen due to unspecified reason: Secondary | ICD-10-CM

## 2015-02-21 DIAGNOSIS — Z9111 Patient's noncompliance with dietary regimen: Secondary | ICD-10-CM

## 2015-02-21 DIAGNOSIS — I10 Essential (primary) hypertension: Secondary | ICD-10-CM | POA: Insufficient documentation

## 2015-02-21 DIAGNOSIS — Z79899 Other long term (current) drug therapy: Secondary | ICD-10-CM | POA: Insufficient documentation

## 2015-02-21 DIAGNOSIS — M549 Dorsalgia, unspecified: Secondary | ICD-10-CM | POA: Insufficient documentation

## 2015-02-21 DIAGNOSIS — Z760 Encounter for issue of repeat prescription: Secondary | ICD-10-CM | POA: Insufficient documentation

## 2015-02-21 DIAGNOSIS — Z880 Allergy status to penicillin: Secondary | ICD-10-CM | POA: Insufficient documentation

## 2015-02-21 DIAGNOSIS — F17210 Nicotine dependence, cigarettes, uncomplicated: Secondary | ICD-10-CM | POA: Insufficient documentation

## 2015-02-21 DIAGNOSIS — E1165 Type 2 diabetes mellitus with hyperglycemia: Secondary | ICD-10-CM | POA: Insufficient documentation

## 2015-02-21 HISTORY — DX: Essential (primary) hypertension: I10

## 2015-02-21 LAB — COMPREHENSIVE METABOLIC PANEL
ALT: 21 U/L (ref 14–54)
ANION GAP: 6 (ref 5–15)
AST: 19 U/L (ref 15–41)
Albumin: 4 g/dL (ref 3.5–5.0)
Alkaline Phosphatase: 94 U/L (ref 38–126)
BILIRUBIN TOTAL: 0.5 mg/dL (ref 0.3–1.2)
BUN: 10 mg/dL (ref 6–20)
CO2: 29 mmol/L (ref 22–32)
Calcium: 9.2 mg/dL (ref 8.9–10.3)
Chloride: 103 mmol/L (ref 101–111)
Creatinine, Ser: 0.55 mg/dL (ref 0.44–1.00)
Glucose, Bld: 255 mg/dL — ABNORMAL HIGH (ref 65–99)
POTASSIUM: 4.1 mmol/L (ref 3.5–5.1)
Sodium: 138 mmol/L (ref 135–145)
TOTAL PROTEIN: 7.6 g/dL (ref 6.5–8.1)

## 2015-02-21 LAB — CBC WITH DIFFERENTIAL/PLATELET
Basophils Absolute: 0.1 10*3/uL (ref 0–0.1)
Basophils Relative: 1 %
EOS PCT: 2 %
Eosinophils Absolute: 0.1 10*3/uL (ref 0–0.7)
HEMATOCRIT: 42.2 % (ref 35.0–47.0)
Hemoglobin: 13.6 g/dL (ref 12.0–16.0)
LYMPHS PCT: 23 %
Lymphs Abs: 2.1 10*3/uL (ref 1.0–3.6)
MCH: 27.6 pg (ref 26.0–34.0)
MCHC: 32.2 g/dL (ref 32.0–36.0)
MCV: 85.8 fL (ref 80.0–100.0)
MONO ABS: 0.5 10*3/uL (ref 0.2–0.9)
MONOS PCT: 6 %
NEUTROS ABS: 6.3 10*3/uL (ref 1.4–6.5)
Neutrophils Relative %: 68 %
Platelets: 272 10*3/uL (ref 150–440)
RBC: 4.92 MIL/uL (ref 3.80–5.20)
RDW: 15.8 % — AB (ref 11.5–14.5)
WBC: 9.1 10*3/uL (ref 3.6–11.0)

## 2015-02-21 LAB — URINALYSIS COMPLETE WITH MICROSCOPIC (ARMC ONLY)
BILIRUBIN URINE: NEGATIVE
Glucose, UA: >500 mg/dL — AB
Hgb urine dipstick: NEGATIVE
KETONES UR: NEGATIVE mg/dL
NITRITE: NEGATIVE
PH: 5 (ref 5.0–8.0)
PROTEIN: NEGATIVE mg/dL
Specific Gravity, Urine: 1.025 (ref 1.005–1.030)

## 2015-02-21 LAB — CK: CK TOTAL: 65 U/L (ref 38–234)

## 2015-02-21 LAB — GLUCOSE, CAPILLARY: GLUCOSE-CAPILLARY: 244 mg/dL — AB (ref 65–99)

## 2015-02-21 MED ORDER — NAPROXEN 500 MG PO TABS
500.0000 mg | ORAL_TABLET | Freq: Once | ORAL | Status: AC
  Administered 2015-02-21: 500 mg via ORAL
  Filled 2015-02-21: qty 1

## 2015-02-21 MED ORDER — NAPROXEN 500 MG PO TABS: 500.0000 mg | ORAL_TABLET | Freq: Two times a day (BID) | ORAL | Status: DC

## 2015-02-21 MED ORDER — TRAMADOL HCL 50 MG PO TABS: 50.0000 mg | ORAL_TABLET | Freq: Four times a day (QID) | ORAL | Status: DC | PRN

## 2015-02-21 MED ORDER — DIAZEPAM 5 MG PO TABS
5.0000 mg | ORAL_TABLET | Freq: Once | ORAL | Status: AC
Start: 2015-02-21 — End: 2015-02-21
  Administered 2015-02-21: 5 mg via ORAL
  Filled 2015-02-21: qty 1

## 2015-02-21 MED ORDER — METFORMIN HCL 500 MG PO TABS: 500.0000 mg | ORAL_TABLET | Freq: Two times a day (BID) | ORAL | Status: DC

## 2015-02-21 NOTE — ED Notes (Signed)
Pt states she fell 2 weeks ago and now she is having pain from the posterior part of neck all the way down to her legs

## 2015-02-21 NOTE — ED Provider Notes (Signed)
[facility] Emergency Department Provider Note ____________________________________________  Time seen: Approximately 8:38 AM  I have reviewed the triage vital signs and the nursing notes.   HISTORY  Chief Complaint Back Pain  HPI Brandi Calderon is a 49 y.o. female who presents to the emergency department for evaluation after falling twice in the past 2 weeks. She has been out of all of her medications since losing her job, that include gliburide/metformin, xanax, and a depression pill. No known reason for falls. No loss of consciousness with either. Last fall was about 10 days ago. She has pain from her neck that goes around to both arms and down her spine into her feet. No relief with ibuprofen or heat.    Past Medical History  Diagnosis Date  . Diabetes mellitus   . Hypertension     There are no active problems to display for this patient.   Past Surgical History  Procedure Laterality Date  . Other surgical history      3 c-sections  . Cesarean section      Current Outpatient Rx  Name  Route  Sig  Dispense  Refill  . acetaminophen (TYLENOL) 500 MG tablet   Oral   Take 2,000 mg by mouth 3 (three) times daily as needed (pain).         . Aspirin-Salicylamide-Caffeine (BC HEADACHE POWDER PO)   Oral   Take 4 packets by mouth daily as needed (pain).         . cyclobenzaprine (FLEXERIL) 10 MG tablet   Oral   Take 1 tablet (10 mg total) by mouth 2 (two) times daily as needed for muscle spasms.   20 tablet   0   . glyBURIDE-metformin (GLUCOVANCE) 5-500 MG per tablet   Oral   Take 1 tablet by mouth 2 (two) times daily with a meal.   60 tablet   1   . ibuprofen (ADVIL,MOTRIN) 200 MG tablet   Oral   Take 400 mg by mouth 3 (three) times daily as needed (pain).         [name] Kitchen ibuprofen (ADVIL,MOTRIN) 800 MG tablet   Oral   Take 1 tablet (800 mg total) by mouth 3 (three) times daily. Patient not taking: Reported on 04/16/2014   30 tablet    0   . metFORMIN (GLUCOPHAGE) 500 MG tablet   Oral   Take 1 tablet (500 mg total) by mouth 2 (two) times daily with a meal.   60 tablet   0   . metroNIDAZOLE (FLAGYL) 500 MG tablet   Oral   Take 1 tablet (500 mg total) by mouth 3 (three) times daily. Patient not taking: Reported on 04/16/2014   21 tablet   0   . naproxen (NAPROSYN) 500 MG tablet   Oral   Take 1 tablet (500 mg total) by mouth 2 (two) times daily with a meal.   60 tablet   0   . Omega-3 Fatty Acids (FISH OIL PO)   Oral   Take 1 capsule by mouth daily.         [name] Kitchen oxyCODONE-acetaminophen (PERCOCET) 5-325 MG per tablet      1 to 2 tablets every 6 hours as needed for pain. Patient not taking: Reported on 04/16/2014   20 tablet   0   . oxyCODONE-acetaminophen (PERCOCET) 5-325 MG per tablet   Oral   Take 1-2 tablets by mouth every 6 (six) hours as needed. Patient not taking: Reported on 04/16/2014  20 tablet   0   . Phenylephrine-DM-GG-APAP (MUCINEX FAST-MAX) 5-10-200-325 MG CAPS   Oral   Take 2 capsules by mouth 3 (three) times daily.   168 capsule   0   . traMADol (ULTRAM) 50 MG tablet   Oral   Take 1 tablet (50 mg total) by mouth every 6 (six) hours as needed.   12 tablet   0     Allergies Penicillins and Vicodin  No family history on file.  Social History Social History  Substance Use Topics  . Smoking status: Current Every Day Smoker    Types: Cigarettes  . Smokeless tobacco: None  . Alcohol Use: No    Review of Systems Constitutional: No fever/chills Eyes: No visual changes. ENT: No sore throat. Cardiovascular: Denies chest pain. Respiratory: Denies shortness of breath. Gastrointestinal: No abdominal pain.  No nausea, no vomiting.  No diarrhea.  No constipation. Genitourinary: Negative for dysuria. Musculoskeletal: Positive for generalized pain. Skin: Negative for rash. Neurological: Negative for headaches, focal weakness or numbness.  10-point ROS otherwise  negative.  ____________________________________________   PHYSICAL EXAM:  VITAL SIGNS: ED Triage Vitals  Enc Vitals Group     BP 02/21/15 0815 158/78 mmHg     Pulse Rate 02/21/15 0815 77     Resp 02/21/15 0815 18     Temp 02/21/15 0815 98.1 F (36.7 C)     Temp Source 02/21/15 0815 Oral     SpO2 02/21/15 0815 98 %     Weight 02/21/15 0815 200 lb (90.719 kg)     Height 02/21/15 0815 5\' 1"  (1.549 m)     Head Cir --      Peak Flow --      Pain Score 02/21/15 0813 10     Pain Loc --      Pain Edu? --      Excl. in [place]? --     Constitutional: Alert and oriented. Well appearing and in no acute distress. Eyes: Conjunctivae are normal. PERRL. EOMI. Head: Atraumatic. Nose: No congestion/rhinnorhea. Mouth/Throat: Mucous membranes are moist.  Oropharynx non-erythematous. Neck: No stridor.   Cardiovascular: Normal rate, regular rhythm. Grossly normal heart sounds.  Good peripheral circulation. Respiratory: Normal respiratory effort.  No retractions. Lungs CTAB. Gastrointestinal: Soft and nontender. No distention. No abdominal bruits. No CVA tenderness. Musculoskeletal: No lower extremity tenderness nor edema.  No joint effusions. Full ROM of all extremities. No focal tenderness. Neurologic:  Normal speech and language. No gross focal neurologic deficits are appreciated. No gait instability. Skin:  Skin is warm, dry and intact. No rash noted. Psychiatric: Mood and affect are normal. Speech and behavior are normal.  ____________________________________________   LABS (all labs ordered are listed, but only abnormal results are displayed)  Labs Reviewed  CBC WITH DIFFERENTIAL/PLATELET - Abnormal; Notable for the following:    RDW 15.8 (*)    All other components within normal limits  COMPREHENSIVE METABOLIC PANEL - Abnormal; Notable for the following:    Glucose, Bld 255 (*)    All other components within normal limits  URINALYSIS COMPLETEWITH MICROSCOPIC ([facility] ONLY) - Abnormal;  Notable for the following:    Color, Urine YELLOW (*)    APPearance HAZY (*)    Glucose, UA >500 (*)    Leukocytes, UA TRACE (*)    Bacteria, UA MANY (*)    Squamous Epithelial / LPF 0-5 (*)    All other components within normal limits  GLUCOSE, CAPILLARY - Abnormal; Notable for the following:  Glucose-Capillary 244 (*)    All other components within normal limits  CK  CBG MONITORING, ED   ____________________________________________  EKG  Sinus rhythm with a rate of 72 bpm. Normal axis and intervals. Normal ST/T. ____________________________________________  RADIOLOGY  Cervical spine film negative for acute abnormality per radiology. ____________________________________________   PROCEDURES  Procedure(s) performed: None  Critical Care performed: No  ____________________________________________   INITIAL IMPRESSION / ASSESSMENT AND PLAN / ED COURSE  Pertinent labs & imaging results that were available during my care of the patient were reviewed by me and considered in my medical decision making (see chart for details).  Patient was strongly encouraged to follow up with the primary care provider of her choice. She was given a list of community resources. She will be given a prescription for metformin, tramadol, and meloxicam. She was given information about the medication management program. Patient's concerned because her Xanax was not refilled. She was advised that the emergency department does not refill controlled substances. She was given information for [facility]. She was advised to return to the emergency department for symptoms change or worsen if she is unable to schedule an appointment. ____________________________________________   FINAL CLINICAL IMPRESSION(S) / ED DIAGNOSES  Final diagnoses:  Noncompliance with treatment plan  Type 2 diabetes mellitus with hyperglycemia, without long-term current use of insulin [facility])  Musculoskeletal pain  Medication refill       Victorino Dike, FNP 02/21/15 1353  Lavonia Drafts, MD 02/21/15 1526

## 2015-02-21 NOTE — ED Provider Notes (Signed)
ED ECG REPORT I, Lavonia Drafts, the attending physician, personally viewed and interpreted this ECG.  Date: 02/21/2015 EKG Time: 8:49 AM Rate: 72 Rhythm: normal sinus rhythm QRS Axis: normal Intervals: normal ST/T Wave abnormalities: normal Conduction Disutrbances: none Narrative Interpretation: unremarkable   Lavonia Drafts, MD 02/21/15 1144

## 2015-03-09 ENCOUNTER — Ambulatory Visit: Payer: PRIVATE HEALTH INSURANCE

## 2015-04-05 ENCOUNTER — Ambulatory Visit: Payer: Self-pay | Admitting: Internal Medicine

## 2015-04-05 LAB — LIPID PANEL
Cholesterol: 211 mg/dL — AB (ref 0–200)
HDL: 25 mg/dL — AB (ref 35–70)
LDL CALC: 149 mg/dL
Triglycerides: 183 mg/dL — AB (ref 40–160)

## 2015-04-05 LAB — CBC AND DIFFERENTIAL
HEMATOCRIT: 43 % (ref 36–46)
Hemoglobin: 14.2 g/dL (ref 12.0–16.0)
NEUTROS ABS: 6 /uL
Platelets: 298 10*3/uL (ref 150–399)
WBC: 9.8 10*3/mL

## 2015-04-05 LAB — BASIC METABOLIC PANEL
BUN: 8 mg/dL (ref 4–21)
Creatinine: 0.6 mg/dL (ref 0.5–1.1)
GLUCOSE: 288 mg/dL
POTASSIUM: 4.7 mmol/L (ref 3.4–5.3)
Sodium: 134 mmol/L — AB (ref 137–147)

## 2015-04-05 LAB — HEPATIC FUNCTION PANEL
ALT: 24 U/L (ref 7–35)
AST: 7 U/L — AB (ref 13–35)
Alkaline Phosphatase: 119 U/L (ref 25–125)
Bilirubin, Total: 0.3 mg/dL

## 2015-04-05 LAB — HEMOGLOBIN A1C: Hemoglobin A1C: 11.6

## 2015-04-05 LAB — TSH: TSH: 1.97 u[IU]/mL (ref 0.41–5.90)

## 2015-04-06 ENCOUNTER — Ambulatory Visit: Payer: Self-pay | Admitting: Licensed Clinical Social Worker

## 2015-04-12 ENCOUNTER — Ambulatory Visit: Payer: Self-pay | Admitting: Internal Medicine

## 2015-04-19 ENCOUNTER — Encounter (HOSPITAL_COMMUNITY): Payer: Self-pay | Admitting: Emergency Medicine

## 2015-04-19 ENCOUNTER — Emergency Department (HOSPITAL_COMMUNITY)
Admission: EM | Admit: 2015-04-19 | Discharge: 2015-04-19 | Disposition: A | Discharge status: Home / Self Care | Payer: PRIVATE HEALTH INSURANCE | Attending: Emergency Medicine | Admitting: Emergency Medicine

## 2015-04-19 DIAGNOSIS — I10 Essential (primary) hypertension: Secondary | ICD-10-CM | POA: Insufficient documentation

## 2015-04-19 DIAGNOSIS — Y9289 Other specified places as the place of occurrence of the external cause: Secondary | ICD-10-CM | POA: Insufficient documentation

## 2015-04-19 DIAGNOSIS — M5442 Lumbago with sciatica, left side: Secondary | ICD-10-CM

## 2015-04-19 DIAGNOSIS — Z79899 Other long term (current) drug therapy: Secondary | ICD-10-CM | POA: Diagnosis not present

## 2015-04-19 DIAGNOSIS — M5440 Lumbago with sciatica, unspecified side: Secondary | ICD-10-CM | POA: Diagnosis not present

## 2015-04-19 DIAGNOSIS — Z880 Allergy status to penicillin: Secondary | ICD-10-CM | POA: Diagnosis not present

## 2015-04-19 DIAGNOSIS — G8929 Other chronic pain: Secondary | ICD-10-CM | POA: Insufficient documentation

## 2015-04-19 DIAGNOSIS — E119 Type 2 diabetes mellitus without complications: Secondary | ICD-10-CM | POA: Insufficient documentation

## 2015-04-19 DIAGNOSIS — M5441 Lumbago with sciatica, right side: Secondary | ICD-10-CM

## 2015-04-19 DIAGNOSIS — F17210 Nicotine dependence, cigarettes, uncomplicated: Secondary | ICD-10-CM | POA: Insufficient documentation

## 2015-04-19 DIAGNOSIS — W000XXA Fall on same level due to ice and snow, initial encounter: Secondary | ICD-10-CM | POA: Insufficient documentation

## 2015-04-19 DIAGNOSIS — S3992XA Unspecified injury of lower back, initial encounter: Secondary | ICD-10-CM | POA: Diagnosis present

## 2015-04-19 DIAGNOSIS — Y998 Other external cause status: Secondary | ICD-10-CM | POA: Insufficient documentation

## 2015-04-19 DIAGNOSIS — Y9389 Activity, other specified: Secondary | ICD-10-CM | POA: Diagnosis not present

## 2015-04-19 MED ORDER — OXYCODONE-ACETAMINOPHEN 5-325 MG PO TABS
1.0000 | ORAL_TABLET | Freq: Once | ORAL | Status: AC
  Administered 2015-04-19: 1 via ORAL
  Filled 2015-04-19: qty 1

## 2015-04-19 MED ORDER — NAPROXEN 500 MG PO TABS: 500.0000 mg | ORAL_TABLET | Freq: Two times a day (BID) | ORAL | Status: DC

## 2015-04-19 MED ORDER — OXYCODONE-ACETAMINOPHEN 5-325 MG PO TABS: 1.0000 | ORAL_TABLET | Freq: Four times a day (QID) | ORAL | Status: DC | PRN

## 2015-04-19 MED ORDER — METHOCARBAMOL 500 MG PO TABS
500.0000 mg | ORAL_TABLET | Freq: Once | ORAL | Status: AC
  Administered 2015-04-19: 500 mg via ORAL
  Filled 2015-04-19: qty 1

## 2015-04-19 NOTE — Discharge Instructions (Signed)
Sciatica With Rehab The sciatic nerve runs from the back down the leg and is responsible for sensation and control of the muscles in the back (posterior) side of the thigh, lower leg, and foot. Sciatica is a condition that is characterized by inflammation of this nerve.  SYMPTOMS   Signs of nerve damage, including numbness and/or weakness along the posterior side of the lower extremity.  Pain in the back of the thigh that may also travel down the leg.  Pain that worsens when sitting for long periods of time.  Occasionally, pain in the back or buttock. CAUSES  Inflammation of the sciatic nerve is the cause of sciatica. The inflammation is due to something irritating the nerve. Common sources of irritation include:  Sitting for long periods of time.  Direct trauma to the nerve.  Arthritis of the spine.  Herniated or ruptured disk.  Slipping of the vertebrae (spondylolisthesis).  Pressure from soft tissues, such as muscles or ligament-like tissue (fascia). RISK INCREASES WITH:  Sports that place pressure or stress on the spine (football or weightlifting).  Poor strength and flexibility.  Failure to warm up properly before activity.  Family history of low back pain or disk disorders.  Previous back injury or surgery.  Poor body mechanics, especially when lifting, or poor posture. PREVENTION   Warm up and stretch properly before activity.  Maintain physical fitness:  Strength, flexibility, and endurance.  Cardiovascular fitness.  Learn and use proper technique, especially with posture and lifting. When possible, have coach correct improper technique.  Avoid activities that place stress on the spine. PROGNOSIS If treated properly, then sciatica usually resolves within 6 weeks. However, occasionally surgery is necessary.  RELATED COMPLICATIONS   Permanent nerve damage, including pain, numbness, tingle, or weakness.  Chronic back pain.  Risks of surgery: infection,  bleeding, nerve damage, or damage to surrounding tissues. TREATMENT Treatment initially involves resting from any activities that aggravate your symptoms. The use of ice and medication may help reduce pain and inflammation. The use of strengthening and stretching exercises may help reduce pain with activity. These exercises may be performed at home or with referral to a therapist. A therapist may recommend further treatments, such as transcutaneous electronic nerve stimulation (TENS) or ultrasound. Your caregiver may recommend corticosteroid injections to help reduce inflammation of the sciatic nerve. If symptoms persist despite non-surgical (conservative) treatment, then surgery may be recommended. MEDICATION  If pain medication is necessary, then nonsteroidal anti-inflammatory medications, such as aspirin and ibuprofen, or other minor pain relievers, such as acetaminophen, are often recommended.  Do not take pain medication for 7 days before surgery.  Prescription pain relievers may be given if deemed necessary by your caregiver. Use only as directed and only as much as you need.  Ointments applied to the skin may be helpful.  Corticosteroid injections may be given by your caregiver. These injections should be reserved for the most serious cases, because they may only be given a certain number of times. HEAT AND COLD  Cold treatment (icing) relieves pain and reduces inflammation. Cold treatment should be applied for 10 to 15 minutes every 2 to 3 hours for inflammation and pain and immediately after any activity that aggravates your symptoms. Use ice packs or massage the area with a piece of ice (ice massage).  Heat treatment may be used prior to performing the stretching and strengthening activities prescribed by your caregiver, physical therapist, or athletic trainer. Use a heat pack or soak the injury in warm water.   SEEK MEDICAL CARE IF:  Treatment seems to offer no benefit, or the condition  worsens.  Any medications produce adverse side effects. EXERCISES  RANGE OF MOTION (ROM) AND STRETCHING EXERCISES - Sciatica Most people with sciatic will find that their symptoms worsen with either excessive bending forward (flexion) or arching at the low back (extension). The exercises which will help resolve your symptoms will focus on the opposite motion. Your physician, physical therapist or athletic trainer will help you determine which exercises will be most helpful to resolve your low back pain. Do not complete any exercises without first consulting with your clinician. Discontinue any exercises which worsen your symptoms until you speak to your clinician. If you have pain, numbness or tingling which travels down into your buttocks, leg or foot, the goal of the therapy is for these symptoms to move closer to your back and eventually resolve. Occasionally, these leg symptoms will get better, but your low back pain may worsen; this is typically an indication of progress in your rehabilitation. Be certain to be very alert to any changes in your symptoms and the activities in which you participated in the 24 hours prior to the change. Sharing this information with your clinician will allow him/her to most efficiently treat your condition. These exercises may help you when beginning to rehabilitate your injury. Your symptoms may resolve with or without further involvement from your physician, physical therapist or athletic trainer. While completing these exercises, remember:   Restoring tissue flexibility helps normal motion to return to the joints. This allows healthier, less painful movement and activity.  An effective stretch should be held for at least 30 seconds.  A stretch should never be painful. You should only feel a gentle lengthening or release in the stretched tissue. FLEXION RANGE OF MOTION AND STRETCHING EXERCISES: STRETCH - Flexion, Single Knee to Chest   Lie on a firm bed or floor  with both legs extended in front of you.  Keeping one leg in contact with the floor, bring your opposite knee to your chest. Hold your leg in place by either grabbing behind your thigh or at your knee.  Pull until you feel a gentle stretch in your low back. Hold __________ seconds.  Slowly release your grasp and repeat the exercise with the opposite side. Repeat __________ times. Complete this exercise __________ times per day.  STRETCH - Flexion, Double Knee to Chest  Lie on a firm bed or floor with both legs extended in front of you.  Keeping one leg in contact with the floor, bring your opposite knee to your chest.  Tense your stomach muscles to support your back and then lift your other knee to your chest. Hold your legs in place by either grabbing behind your thighs or at your knees.  Pull both knees toward your chest until you feel a gentle stretch in your low back. Hold __________ seconds.  Tense your stomach muscles and slowly return one leg at a time to the floor. Repeat __________ times. Complete this exercise __________ times per day.  STRETCH - Low Trunk Rotation   Lie on a firm bed or floor. Keeping your legs in front of you, bend your knees so they are both pointed toward the ceiling and your feet are flat on the floor.  Extend your arms out to the side. This will stabilize your upper body by keeping your shoulders in contact with the floor.  Gently and slowly drop both knees together to one side until   you feel a gentle stretch in your low back. Hold for __________ seconds.  Tense your stomach muscles to support your low back as you bring your knees back to the starting position. Repeat the exercise to the other side. Repeat __________ times. Complete this exercise __________ times per day  EXTENSION RANGE OF MOTION AND FLEXIBILITY EXERCISES: STRETCH - Extension, Prone on Elbows  Lie on your stomach on the floor, a bed will be too soft. Place your palms about shoulder  width apart and at the height of your head.  Place your elbows under your shoulders. If this is too painful, stack pillows under your chest.  Allow your body to relax so that your hips drop lower and make contact more completely with the floor.  Hold this position for __________ seconds.  Slowly return to lying flat on the floor. Repeat __________ times. Complete this exercise __________ times per day.  RANGE OF MOTION - Extension, Prone Press Ups  Lie on your stomach on the floor, a bed will be too soft. Place your palms about shoulder width apart and at the height of your head.  Keeping your back as relaxed as possible, slowly straighten your elbows while keeping your hips on the floor. You may adjust the placement of your hands to maximize your comfort. As you gain motion, your hands will come more underneath your shoulders.  Hold this position __________ seconds.  Slowly return to lying flat on the floor. Repeat __________ times. Complete this exercise __________ times per day.  STRENGTHENING EXERCISES - Sciatica  These exercises may help you when beginning to rehabilitate your injury. These exercises should be done near your "sweet spot." This is the neutral, low-back arch, somewhere between fully rounded and fully arched, that is your least painful position. When performed in this safe range of motion, these exercises can be used for people who have either a flexion or extension based injury. These exercises may resolve your symptoms with or without further involvement from your physician, physical therapist or athletic trainer. While completing these exercises, remember:   Muscles can gain both the endurance and the strength needed for everyday activities through controlled exercises.  Complete these exercises as instructed by your physician, physical therapist or athletic trainer. Progress with the resistance and repetition exercises only as your caregiver advises.  You may  experience muscle soreness or fatigue, but the pain or discomfort you are trying to eliminate should never worsen during these exercises. If this pain does worsen, stop and make certain you are following the directions exactly. If the pain is still present after adjustments, discontinue the exercise until you can discuss the trouble with your clinician. STRENGTHENING - Deep Abdominals, Pelvic Tilt   Lie on a firm bed or floor. Keeping your legs in front of you, bend your knees so they are both pointed toward the ceiling and your feet are flat on the floor.  Tense your lower abdominal muscles to press your low back into the floor. This motion will rotate your pelvis so that your tail bone is scooping upwards rather than pointing at your feet or into the floor.  With a gentle tension and even breathing, hold this position for __________ seconds. Repeat __________ times. Complete this exercise __________ times per day.  STRENGTHENING - Abdominals, Crunches   Lie on a firm bed or floor. Keeping your legs in front of you, bend your knees so they are both pointed toward the ceiling and your feet are flat on the   floor. Cross your arms over your chest.  Slightly tip your chin down without bending your neck.  Tense your abdominals and slowly lift your trunk high enough to just clear your shoulder blades. Lifting higher can put excessive stress on the low back and does not further strengthen your abdominal muscles.  Control your return to the starting position. Repeat __________ times. Complete this exercise __________ times per day.  STRENGTHENING - Quadruped, Opposite UE/LE Lift  Assume a hands and knees position on a firm surface. Keep your hands under your shoulders and your knees under your hips. You may place padding under your knees for comfort.  Find your neutral spine and gently tense your abdominal muscles so that you can maintain this position. Your shoulders and hips should form a rectangle  that is parallel with the floor and is not twisted.  Keeping your trunk steady, lift your right hand no higher than your shoulder and then your left leg no higher than your hip. Make sure you are not holding your breath. Hold this position __________ seconds.  Continuing to keep your abdominal muscles tense and your back steady, slowly return to your starting position. Repeat with the opposite arm and leg. Repeat __________ times. Complete this exercise __________ times per day.  STRENGTHENING - Abdominals and Quadriceps, Straight Leg Raise   Lie on a firm bed or floor with both legs extended in front of you.  Keeping one leg in contact with the floor, bend the other knee so that your foot can rest flat on the floor.  Find your neutral spine, and tense your abdominal muscles to maintain your spinal position throughout the exercise.  Slowly lift your straight leg off the floor about 6 inches for a count of 15, making sure to not hold your breath.  Still keeping your neutral spine, slowly lower your leg all the way to the floor. Repeat this exercise with each leg __________ times. Complete this exercise __________ times per day. POSTURE AND BODY MECHANICS CONSIDERATIONS - Sciatica Keeping correct posture when sitting, standing or completing your activities will reduce the stress put on different body tissues, allowing injured tissues a chance to heal and limiting painful experiences. The following are general guidelines for improved posture. Your physician or physical therapist will provide you with any instructions specific to your needs. While reading these guidelines, remember:  The exercises prescribed by your provider will help you have the flexibility and strength to maintain correct postures.  The correct posture provides the optimal environment for your joints to work. All of your joints have less wear and tear when properly supported by a spine with good posture. This means you will  experience a healthier, less painful body.  Correct posture must be practiced with all of your activities, especially prolonged sitting and standing. Correct posture is as important when doing repetitive low-stress activities (typing) as it is when doing a single heavy-load activity (lifting). RESTING POSITIONS Consider which positions are most painful for you when choosing a resting position. If you have pain with flexion-based activities (sitting, bending, stooping, squatting), choose a position that allows you to rest in a less flexed posture. You would want to avoid curling into a fetal position on your side. If your pain worsens with extension-based activities (prolonged standing, working overhead), avoid resting in an extended position such as sleeping on your stomach. Most people will find more comfort when they rest with their spine in a more neutral position, neither too rounded nor too   arched. Lying on a non-sagging bed on your side with a pillow between your knees, or on your back with a pillow under your knees will often provide some relief. Keep in mind, being in any one position for a prolonged period of time, no matter how correct your posture, can still lead to stiffness. PROPER SITTING POSTURE In order to minimize stress and discomfort on your spine, you must sit with correct posture Sitting with good posture should be effortless for a healthy body. Returning to good posture is a gradual process. Many people can work toward this most comfortably by using various supports until they have the flexibility and strength to maintain this posture on their own. When sitting with proper posture, your ears will fall over your shoulders and your shoulders will fall over your hips. You should use the back of the chair to support your upper back. Your low back will be in a neutral position, just slightly arched. You may place a small pillow or folded towel at the base of your low back for support.  When  working at a desk, create an environment that supports good, upright posture. Without extra support, muscles fatigue and lead to excessive strain on joints and other tissues. Keep these recommendations in mind: CHAIR:   A chair should be able to slide under your desk when your back makes contact with the back of the chair. This allows you to work closely.  The chair's height should allow your eyes to be level with the upper part of your monitor and your hands to be slightly lower than your elbows. BODY POSITION  Your feet should make contact with the floor. If this is not possible, use a foot rest.  Keep your ears over your shoulders. This will reduce stress on your neck and low back. INCORRECT SITTING POSTURES   If you are feeling tired and unable to assume a healthy sitting posture, do not slouch or slump. This puts excessive strain on your back tissues, causing more damage and pain. Healthier options include:  Using more support, like a lumbar pillow.  Switching tasks to something that requires you to be upright or walking.  Talking a brief walk.  Lying down to rest in a neutral-spine position. PROLONGED STANDING WHILE SLIGHTLY LEANING FORWARD  When completing a task that requires you to lean forward while standing in one place for a long time, place either foot up on a stationary 2-4 inch high object to help maintain the best posture. When both feet are on the ground, the low back tends to lose its slight inward curve. If this curve flattens (or becomes too large), then the back and your other joints will experience too much stress, fatigue more quickly and can cause pain.  CORRECT STANDING POSTURES Proper standing posture should be assumed with all daily activities, even if they only take a few moments, like when brushing your teeth. As in sitting, your ears should fall over your shoulders and your shoulders should fall over your hips. You should keep a slight tension in your abdominal  muscles to brace your spine. Your tailbone should point down to the ground, not behind your body, resulting in an over-extended swayback posture.  INCORRECT STANDING POSTURES  Common incorrect standing postures include a forward head, locked knees and/or an excessive swayback. WALKING Walk with an upright posture. Your ears, shoulders and hips should all line-up. PROLONGED ACTIVITY IN A FLEXED POSITION When completing a task that requires you to bend forward   at your waist or lean over a low surface, try to find a way to stabilize 3 of 4 of your limbs. You can place a hand or elbow on your thigh or rest a knee on the surface you are reaching across. This will provide you more stability so that your muscles do not fatigue as quickly. By keeping your knees relaxed, or slightly bent, you will also reduce stress across your low back. CORRECT LIFTING TECHNIQUES DO :   Assume a wide stance. This will provide you more stability and the opportunity to get as close as possible to the object which you are lifting.  Tense your abdominals to brace your spine; then bend at the knees and hips. Keeping your back locked in a neutral-spine position, lift using your leg muscles. Lift with your legs, keeping your back straight.  Test the weight of unknown objects before attempting to lift them.  Try to keep your elbows locked down at your sides in order get the best strength from your shoulders when carrying an object.  Always ask for help when lifting heavy or awkward objects. INCORRECT LIFTING TECHNIQUES DO NOT:   Lock your knees when lifting, even if it is a small object.  Bend and twist. Pivot at your feet or move your feet when needing to change directions.  Assume that you cannot safely pick up a paperclip without proper posture.   This information is not intended to replace advice given to you by your health care provider. Make sure you discuss any questions you have with your health care provider.     Document Released: 03/18/2005 Document Revised: 08/02/2014 Document Reviewed: 06/30/2008 [employer] Interactive Patient Education 2016 [employer]  

## 2015-04-19 NOTE — ED Notes (Signed)
C/o mid back pain since slipping on steps last week. States is taking Ibuprofen w/o improvement.

## 2015-04-19 NOTE — ED Notes (Signed)
Patient states had a fall x 2 months ago and still have back pain.  Patient states didn't see anyone at the time of the injury.

## 2015-04-19 NOTE — ED Notes (Signed)
"  What am I supposed to do for pain? I've got muscle relaxers at home". Will notify PA.

## 2015-04-19 NOTE — ED Provider Notes (Signed)
CSN: [id number]     Arrival date & time 04/19/15  1206 History  By signing my name below, I, Essence Howell, attest that this documentation has been prepared under the direction and in the presence of Josephina Gip, PA-C Electronically Signed: Ladene Artist, ED Scribe 04/19/2015 at 1:46 PM.   Chief Complaint  Patient presents with  . Back Pain   The history is provided by the patient. No language interpreter was used.   HPI Comments: Brandi Calderon is a 50 y.o. female who presents to the Emergency Department complaining of a slip and fall that occurred 6 months ago and another fall 2 weeks ago on ice. She has not been evaluated for back pain. She states that the fall 6 months ago was at work and she has had constant pain since. The fall 2 weeks ago exacerbated her chronic pain. She has not been evaluated for her back pain though she reports "having muscle relaxers at home that don't work". She states she is seeing her PCP in 3 weeks for evaluation. Pt reports constant lower back pain that radiates into both legs and is exacerbated with lying back and ambulating. Her pain is described as an ache which shoots into her thighs. Pain is improved with sitting upright and leaning forward. She has tried a heating pad, ice, Naproxen and 800 mg ibuprofen without signigicant relief. She denies bladder/bowel incontinence, numbness in lower extremities, weakness of the lower extremities, inability to ambulate, fevers, chills, IVDU or CA hx.   Past Medical History  Diagnosis Date  . Diabetes mellitus   . Hypertension    Past Surgical History  Procedure Laterality Date  . Other surgical history      3 c-sections  . Cesarean section     No family history on file. Social History  Substance Use Topics  . Smoking status: Current Every Day Smoker    Types: Cigarettes  . Smokeless tobacco: None  . Alcohol Use: No   OB History    No data available     Review of Systems  Musculoskeletal: Positive  for back pain.  Neurological: Negative for numbness.  All other systems reviewed and are negative.  Allergies  Penicillins and Vicodin  Home Medications   Prior to Admission medications   Medication Sig Start Date End Date Taking? Authorizing Provider  acetaminophen (TYLENOL) 500 MG tablet Take 2,000 mg by mouth 3 (three) times daily as needed (pain).    Historical Provider, MD  Aspirin-Salicylamide-Caffeine (BC HEADACHE POWDER PO) Take 4 packets by mouth daily as needed (pain).    Historical Provider, MD  cyclobenzaprine (FLEXERIL) 10 MG tablet Take 1 tablet (10 mg total) by mouth 2 (two) times daily as needed for muscle spasms. 04/21/14   Britt Bottom, NP  glyBURIDE-metformin (GLUCOVANCE) 5-500 MG per tablet Take 1 tablet by mouth 2 (two) times daily with a meal. 10/25/12   Fransico Meadow, PA-C  ibuprofen (ADVIL,MOTRIN) 200 MG tablet Take 400 mg by mouth 3 (three) times daily as needed (pain).    Historical Provider, MD  ibuprofen (ADVIL,MOTRIN) 800 MG tablet Take 1 tablet (800 mg total) by mouth 3 (three) times daily. Patient not taking: Reported on 04/16/2014 01/30/13   Wandra Arthurs, MD  metFORMIN (GLUCOPHAGE) 500 MG tablet Take 1 tablet (500 mg total) by mouth 2 (two) times daily with a meal. 02/21/15 02/21/16  Victorino Dike, FNP  metroNIDAZOLE (FLAGYL) 500 MG tablet Take 1 tablet (500 mg total) by mouth 3 (three)  times daily. Patient not taking: Reported on 04/16/2014 11/27/13   Harden Mo, MD  naproxen (NAPROSYN) 500 MG tablet Take 1 tablet (500 mg total) by mouth 2 (two) times daily with a meal. 02/21/15 02/21/16  Cari B Triplett, FNP  naproxen (NAPROSYN) 500 MG tablet Take 1 tablet (500 mg total) by mouth 2 (two) times daily. 04/19/15   Maahi Lannan, PA-C  Omega-3 Fatty Acids (FISH OIL PO) Take 1 capsule by mouth daily.    Historical Provider, MD  oxyCODONE-acetaminophen (PERCOCET) 5-325 MG per tablet 1 to 2 tablets every 6 hours as needed for pain. Patient not taking: Reported  on 04/16/2014 11/27/13   Harden Mo, MD  oxyCODONE-acetaminophen (PERCOCET) 5-325 MG per tablet Take 1-2 tablets by mouth every 6 (six) hours as needed. Patient not taking: Reported on 04/16/2014 12/18/13   Dahlia Bailiff, PA-C  oxyCODONE-acetaminophen (PERCOCET/ROXICET) 5-325 MG tablet Take 1 tablet by mouth every 6 (six) hours as needed for severe pain. 04/19/15   Jeromey Kruer, PA-C  Phenylephrine-DM-GG-APAP (MUCINEX FAST-MAX) 5-10-200-325 MG CAPS Take 2 capsules by mouth 3 (three) times daily. 04/21/14   Britt Bottom, NP  traMADol (ULTRAM) 50 MG tablet Take 1 tablet (50 mg total) by mouth every 6 (six) hours as needed. 02/21/15   Cari B Triplett, FNP   BP 149/80 mmHg  Pulse 69  Temp(Src) 97.8 F (36.6 C) (Oral)  Resp 18  SpO2 99%  LMP 04/19/2015 Physical Exam  Constitutional: She appears well-developed and well-nourished. No distress.  HENT:  Head: Normocephalic and atraumatic.  Right Ear: External ear normal.  Left Ear: External ear normal.  Eyes: Conjunctivae are normal. Right eye exhibits no discharge. Left eye exhibits no discharge. No scleral icterus.  Neck: Normal range of motion.  Cardiovascular: Normal rate, regular rhythm and intact distal pulses.   Pedal pulse palpable  Pulmonary/Chest: Effort normal and breath sounds normal. No respiratory distress.  Musculoskeletal: Normal range of motion.       Lumbar back: She exhibits tenderness. She exhibits normal range of motion, no bony tenderness, no deformity and no spasm.       Back:  Generalized tenderness of lumbar region bilaterally. No focal tenderness over lumbar or thoracic spine. No spinous process deformities or step-offs. Full range of motion of the thoracic and lumbar spine intact. No spasm. Patient walks with a steady gait unassisted.  Neurological: She is alert. Coordination normal.  5/5 strength of BLE with sensation intact.   Skin: Skin is warm and dry.  Psychiatric: She has a normal mood and affect. Her  behavior is normal.  Nursing note and vitals reviewed.  ED Course  Procedures (including critical care time) DIAGNOSTIC STUDIES: Oxygen Saturation is 99% on RA, normal by my interpretation.    COORDINATION OF CARE: 1:01 PM-Discussed treatment plan which includes Percocet and Robaxin with pt at bedside and pt agreed to plan.   Labs Review Labs Reviewed - No data to display   Imaging Review No results found.   EKG Interpretation None      MDM   Final diagnoses:  Bilateral low back pain with sciatica, sciatica laterality unspecified   Patient presenting with back pain x 6 months with acute worsening x 2 weeks after a fall. Afebrile. Non-focal neuro exam. TTP of bilateral lumbar paraspinous muscles. FROM of back and lower extremities intact. Patient is able to ambulate though with some discomfort. No loss of bowel or bladder control. No numbness or weakness in the lower extremities. No concern for  cauda equina. No history of IVDU or cancer. Conservative therapy including back exercises, heat, ice, tylenol or ibuprofen discussed. #6 percocet given for breakthrough pain. Pt has hx of uncontrolled DM so will not send home on prednisone. Strongly encouraged to follow up with her PCP for chronic back pain. Return precautions discussed and given in discharge paperwork. Pt is stable for discharge.    I personally performed the services described in this documentation, which was scribed in my presence. The recorded information has been reviewed and is accurate.      Josephina Gip, PA-C 04/19/15 1420  Elnora Morrison, MD 04/19/15 [phone]

## 2015-04-20 ENCOUNTER — Ambulatory Visit: Payer: Self-pay | Admitting: Licensed Clinical Social Worker

## 2015-04-26 ENCOUNTER — Ambulatory Visit: Payer: Self-pay | Admitting: Internal Medicine

## 2015-05-23 ENCOUNTER — Ambulatory Visit: Payer: Self-pay

## 2015-05-24 ENCOUNTER — Ambulatory Visit: Payer: Self-pay | Admitting: Internal Medicine

## 2015-05-24 DIAGNOSIS — M549 Dorsalgia, unspecified: Secondary | ICD-10-CM | POA: Insufficient documentation

## 2015-05-24 DIAGNOSIS — E119 Type 2 diabetes mellitus without complications: Secondary | ICD-10-CM | POA: Insufficient documentation

## 2015-05-24 DIAGNOSIS — E1169 Type 2 diabetes mellitus with other specified complication: Secondary | ICD-10-CM | POA: Insufficient documentation

## 2015-05-24 DIAGNOSIS — Z794 Long term (current) use of insulin: Secondary | ICD-10-CM

## 2015-05-26 DIAGNOSIS — M545 Low back pain: Secondary | ICD-10-CM

## 2015-05-26 DIAGNOSIS — E118 Type 2 diabetes mellitus with unspecified complications: Secondary | ICD-10-CM

## 2015-06-02 ENCOUNTER — Telehealth: Payer: Self-pay

## 2015-06-02 NOTE — Telephone Encounter (Signed)
This encounter was created in error - please disregard.

## 2015-06-02 NOTE — Telephone Encounter (Signed)
Patient states bladder pain is much better since last visit. Pain level is 1 of 10. . Patient is very happy

## 2015-06-08 ENCOUNTER — Emergency Department
Admission: EM | Admit: 2015-06-08 | Discharge: 2015-06-08 | Disposition: A | Discharge status: Home / Self Care | Payer: PRIVATE HEALTH INSURANCE

## 2015-06-08 NOTE — ED Notes (Signed)
Attempted to call from the lobby no response

## 2015-06-08 NOTE — ED Notes (Signed)
Attempted to call from the lobby, , no response

## 2015-06-28 ENCOUNTER — Other Ambulatory Visit: Payer: Self-pay

## 2015-06-28 DIAGNOSIS — E119 Type 2 diabetes mellitus without complications: Secondary | ICD-10-CM

## 2015-06-29 LAB — HEMOGLOBIN A1C
Est. average glucose Bld gHb Est-mCnc: 252 mg/dL
HEMOGLOBIN A1C: 10.4 % — AB (ref 4.8–5.6)

## 2015-06-29 LAB — CBC WITH DIFFERENTIAL/PLATELET
BASOS ABS: 0 10*3/uL (ref 0.0–0.2)
Basos: 0 %
EOS (ABSOLUTE): 0.2 10*3/uL (ref 0.0–0.4)
EOS: 2 %
HEMOGLOBIN: 13 g/dL (ref 11.1–15.9)
Hematocrit: 40.3 % (ref 34.0–46.6)
IMMATURE GRANS (ABS): 0 10*3/uL (ref 0.0–0.1)
Immature Granulocytes: 0 %
LYMPHS: 32 %
Lymphocytes Absolute: 2.9 10*3/uL (ref 0.7–3.1)
MCH: 27.6 pg (ref 26.6–33.0)
MCHC: 32.3 g/dL (ref 31.5–35.7)
MCV: 86 fL (ref 79–97)
MONOCYTES: 6 %
Monocytes Absolute: 0.5 10*3/uL (ref 0.1–0.9)
NEUTROS ABS: 5.5 10*3/uL (ref 1.4–7.0)
Neutrophils: 60 %
Platelets: 315 10*3/uL (ref 150–379)
RBC: 4.71 x10E6/uL (ref 3.77–5.28)
RDW: 15.6 % — ABNORMAL HIGH (ref 12.3–15.4)
WBC: 9.1 10*3/uL (ref 3.4–10.8)

## 2015-06-29 LAB — URINALYSIS
BILIRUBIN UA: NEGATIVE
Ketones, UA: NEGATIVE
Nitrite, UA: NEGATIVE
PROTEIN UA: NEGATIVE
UUROB: 1 mg/dL (ref 0.2–1.0)
pH, UA: 6 (ref 5.0–7.5)

## 2015-06-29 LAB — BASIC METABOLIC PANEL
BUN / CREAT RATIO: 19 (ref 9–23)
BUN: 11 mg/dL (ref 6–24)
CHLORIDE: 98 mmol/L (ref 96–106)
CO2: 26 mmol/L (ref 18–29)
Calcium: 9.1 mg/dL (ref 8.7–10.2)
Creatinine, Ser: 0.59 mg/dL (ref 0.57–1.00)
GFR calc Af Amer: 126 mL/min/{1.73_m2} (ref >59)
GFR calc non Af Amer: 109 mL/min/{1.73_m2} (ref >59)
GLUCOSE: 199 mg/dL — AB (ref 65–99)
Potassium: 4.5 mmol/L (ref 3.5–5.2)
SODIUM: 140 mmol/L (ref 134–144)

## 2015-07-01 LAB — URINE CULTURE

## 2015-07-05 ENCOUNTER — Ambulatory Visit: Payer: Self-pay | Admitting: Internal Medicine

## 2015-07-12 ENCOUNTER — Ambulatory Visit: Payer: Self-pay | Admitting: Internal Medicine

## 2015-07-12 VITALS — BP 136/74 | HR 83 | Temp 98.4°F | Wt 177.0 lb

## 2015-07-12 DIAGNOSIS — E119 Type 2 diabetes mellitus without complications: Secondary | ICD-10-CM

## 2015-07-12 LAB — GLUCOSE, POCT (MANUAL RESULT ENTRY): POC GLUCOSE: 169 mg/dL — AB (ref 70–99)

## 2015-07-12 NOTE — Progress Notes (Signed)
 Subjective:    Patient ID: Brandi Calderon, female    DOB: 05/01/1965, 50 y.o.   MRN: [id number]  HPI  Patient Active Problem List   Diagnosis Date Noted  . Diabetes (HCC) 05/24/2015  . Back pain 05/24/2015   Pt presents with a f/u for diabetes. Blood glucose this morning was 169. Pt did not eat breakfast. With Metformin 1000 mg, blood glucose has dropped. Pt does not know when she is seeing the endocrinologist again. Endo ordered a Victoza shot but pt has not done it yet.   Pt complains of frequent memory loss.   Review of Systems     Objective:   Physical Exam  Constitutional: She is oriented to person, place, and time.  Cardiovascular: Normal rate, regular rhythm and normal heart sounds.   Pulmonary/Chest: Effort normal and breath sounds normal.  Neurological: She is alert and oriented to person, place, and time.      Medication List       This list is accurate as of: 07/12/15 10:08 AM.  Always use your most recent med list.               acetaminophen 500 MG tablet  Commonly known as:  TYLENOL  Take 2,000 mg by mouth 3 (three) times daily as needed (pain). Reported on 07/12/2015     BC HEADACHE POWDER PO  Take 4 packets by mouth daily as needed (pain).     citalopram 20 MG tablet  Commonly known as:  CELEXA  Take 20 mg by mouth daily.     cyclobenzaprine 10 MG tablet  Commonly known as:  FLEXERIL  Take 1 tablet (10 mg total) by mouth 2 (two) times daily as needed for muscle spasms.     FISH OIL PO  Take 1 capsule by mouth daily.     gabapentin 600 MG tablet  Commonly known as:  NEURONTIN  Take 600 mg by mouth 3 (three) times daily.     glipiZIDE 5 MG tablet  Commonly known as:  GLUCOTROL  Take by mouth daily before breakfast.     glyBURIDE-metformin 5-500 MG tablet  Commonly known as:  GLUCOVANCE  Take 1 tablet by mouth 2 (two) times daily with a meal.     ibuprofen 800 MG tablet  Commonly known as:  ADVIL,MOTRIN  Take 1 tablet (800 mg total)  by mouth 3 (three) times daily.     ibuprofen 200 MG tablet  Commonly known as:  ADVIL,MOTRIN  Take 400 mg by mouth 3 (three) times daily as needed (pain). Reported on 07/12/2015     metFORMIN 500 MG tablet  Commonly known as:  GLUCOPHAGE  Take 1 tablet (500 mg total) by mouth 2 (two) times daily with a meal.     metroNIDAZOLE 500 MG tablet  Commonly known as:  FLAGYL  Take 1 tablet (500 mg total) by mouth 3 (three) times daily.     naproxen 500 MG tablet  Commonly known as:  NAPROSYN  Take 1 tablet (500 mg total) by mouth 2 (two) times daily with a meal.     naproxen 500 MG tablet  Commonly known as:  NAPROSYN  Take 1 tablet (500 mg total) by mouth 2 (two) times daily.     oxyCODONE-acetaminophen 5-325 MG tablet  Commonly known as:  PERCOCET  1 to 2 tablets every 6 hours as needed for pain.     Phenylephrine-DM-GG-APAP 5-10-200-325 MG Caps  Commonly known as:  MUCINEX FAST-MAX  Take   2 capsules by mouth 3 (three) times daily.     traMADol 50 MG tablet  Commonly known as:  ULTRAM  Take 1 tablet (50 mg total) by mouth every 6 (six) hours as needed.     traZODone 50 MG tablet  Commonly known as:  DESYREL  Take 50 mg by mouth at bedtime.     VICTOZA 18 MG/3ML Sopn  Generic drug:  Liraglutide  Inject into the skin.       BP 136/74 mmHg  Pulse 83  Temp(Src) 98.4 F (36.9 C)  Wt 177 lb (80.287 kg)  SpO2 97%  Blood glucose: 169    Assessment & Plan:  Diabetes (HCC) Sugars have improved. A1c is improved from 11 to 10, we want to get that to 7. Pt needs to go back to the endo clinic.    Needs endo in 2 months Met c, CBC, a1c before  Md: 4 months Same labs plus Lipid                

## 2015-07-12 NOTE — Assessment & Plan Note (Addendum)
Sugars have improved. A1c is improved from 11 to 10, we want to get that to 7. Pt needs to go back to the endo clinic.

## 2015-07-18 ENCOUNTER — Ambulatory Visit: Payer: Self-pay

## 2015-07-19 ENCOUNTER — Other Ambulatory Visit: Payer: Self-pay

## 2015-07-20 MED ORDER — CLINDAMYCIN HCL 300 MG PO CAPS: 300.0000 mg | ORAL_CAPSULE | Freq: Two times a day (BID) | ORAL | Status: DC

## 2015-08-01 ENCOUNTER — Ambulatory Visit: Payer: Self-pay | Admitting: Obstetrics & Gynecology

## 2015-08-01 VITALS — BP 130/78 | HR 62 | Resp 16 | Ht 61.0 in | Wt 175.0 lb

## 2015-08-01 DIAGNOSIS — N921 Excessive and frequent menstruation with irregular cycle: Secondary | ICD-10-CM

## 2015-08-01 DIAGNOSIS — Z124 Encounter for screening for malignant neoplasm of cervix: Secondary | ICD-10-CM

## 2015-08-01 NOTE — Progress Notes (Signed)
Patient ID: Brandi Calderon, female   DOB: 10/18/1965, 50 y.o.   MRN: [id number]  Patient: Brandi Calderon Female    DOB: October 24, 1965   50 y.o.   MRN: [id number] Visit Date: 08/01/2015  Today's Provider: [facility]   Chief Complaint  Patient presents with  . Menstrual Problem   Subjective:    HPI   Pt comes in today to discuss Irregular Menstruations.  She reports her periods have become extremely heavy and painful.  She says she soaks through pads and tampons. She would like to consider options to help with her symptoms.  Pt has 3 prior CS.  Reg but more recently heavier.  + Smoker.  No blood thinner meds.    Allergies  Allergen Reactions  . Penicillins Anaphylaxis    Throat swells  . Vicodin [Hydrocodone-Acetaminophen] Hives   Previous Medications   ACETAMINOPHEN (TYLENOL) 500 MG TABLET    Take 2,000 mg by mouth 3 (three) times daily as needed (pain). Reported on [id number]   ASPIRIN-SALICYLAMIDE-CAFFEINE (BC HEADACHE POWDER PO)    Take 4 packets by mouth daily as needed (pain).   CITALOPRAM (CELEXA) 20 MG TABLET    Take 20 mg by mouth daily.   CYCLOBENZAPRINE (FLEXERIL) 10 MG TABLET    Take 1 tablet (10 mg total) by mouth 2 (two) times daily as needed for muscle spasms.   GABAPENTIN (NEURONTIN) 600 MG TABLET    Take 600 mg by mouth 3 (three) times daily.   GLIPIZIDE (GLUCOTROL) 5 MG TABLET    Take by mouth daily before breakfast.   GLYBURIDE-METFORMIN (GLUCOVANCE) 5-500 MG PER TABLET    Take 1 tablet by mouth 2 (two) times daily with a meal.   IBUPROFEN (ADVIL,MOTRIN) 800 MG TABLET    Take 1 tablet (800 mg total) by mouth 3 (three) times daily.   LIRAGLUTIDE (VICTOZA) 18 MG/3ML SOPN    Inject into the skin. Reported on 08/01/2015   METFORMIN (GLUCOPHAGE) 500 MG TABLET    Take 1 tablet (500 mg total) by mouth 2 (two) times daily with a meal.   NAPROXEN (NAPROSYN) 500 MG TABLET    Take 1 tablet (500 mg total) by mouth 2 (two) times daily.   OMEGA-3 FATTY ACIDS (FISH OIL PO)    Take  1 capsule by mouth daily.   OXYCODONE-ACETAMINOPHEN (PERCOCET) 5-325 MG PER TABLET    1 to 2 tablets every 6 hours as needed for pain.   PHENYLEPHRINE-DM-GG-APAP (MUCINEX FAST-MAX) 5-10-200-325 MG CAPS    Take 2 capsules by mouth 3 (three) times daily.   TRAMADOL (ULTRAM) 50 MG TABLET    Take 1 tablet (50 mg total) by mouth every 6 (six) hours as needed.   TRAZODONE (DESYREL) 50 MG TABLET    Take 50 mg by mouth at bedtime.    Review of Systems  Constitutional: Negative.   Genitourinary: Positive for menstrual problem. Negative for vaginal bleeding, vaginal discharge and vaginal pain.    Social History  Substance Use Topics  . Smoking status: Current Every Day Smoker -- 2.00 packs/day    Types: Cigarettes  . Smokeless tobacco: Not on file  . Alcohol Use: No   Objective:   BP 130/78 mmHg  Pulse 62  Resp 16  Ht 5\' 1"  (1.549 m)  Wt 175 lb (79.379 kg)  BMI 33.08 kg/m2  LMP 07/20/2015 (Exact Date)  Physical Exam  Constitutional: She is oriented to person, place, and time. She appears well-developed and well-nourished.  Neck: No thyroid mass  and no thyromegaly present.  Abdominal: Soft. Normal appearance. There is no tenderness.  Genitourinary: Uterus is enlarged (Slightly enlarged 8 weeks size. ).  Ut  Neurological: She is alert and oriented to person, place, and time.  Skin: Skin is warm and dry.  Psychiatric: She has a normal mood and affect. Her behavior is normal. Judgment and thought content normal.        Assessment & Plan:     1. Encounter for screening for cervical cancer  Obtained Pap. Also consideration for AUB and cervical cancer, though less likely - Pap IG (Image Guided)  2. Menorrhagia with irregular cycle Unclear etiology; less likely large mass or cancer, may have small fibroids or polyps; hyperplasia and cancer evaluation would be reassuring will obtain Ultra Sound.  Consider EMB. - US Transvaginal Non-OB       [facility]

## 2015-08-03 ENCOUNTER — Encounter: Payer: Self-pay | Admitting: Emergency Medicine

## 2015-08-03 ENCOUNTER — Emergency Department
Admission: EM | Admit: 2015-08-03 | Discharge: 2015-08-03 | Disposition: A | Discharge status: Home / Self Care | Payer: PRIVATE HEALTH INSURANCE | Attending: Emergency Medicine | Admitting: Emergency Medicine

## 2015-08-03 DIAGNOSIS — F17210 Nicotine dependence, cigarettes, uncomplicated: Secondary | ICD-10-CM | POA: Insufficient documentation

## 2015-08-03 DIAGNOSIS — G8929 Other chronic pain: Secondary | ICD-10-CM

## 2015-08-03 DIAGNOSIS — Z7984 Long term (current) use of oral hypoglycemic drugs: Secondary | ICD-10-CM | POA: Insufficient documentation

## 2015-08-03 DIAGNOSIS — Z7982 Long term (current) use of aspirin: Secondary | ICD-10-CM | POA: Insufficient documentation

## 2015-08-03 DIAGNOSIS — M47817 Spondylosis without myelopathy or radiculopathy, lumbosacral region: Secondary | ICD-10-CM | POA: Insufficient documentation

## 2015-08-03 DIAGNOSIS — M549 Dorsalgia, unspecified: Secondary | ICD-10-CM

## 2015-08-03 DIAGNOSIS — Z79899 Other long term (current) drug therapy: Secondary | ICD-10-CM | POA: Insufficient documentation

## 2015-08-03 DIAGNOSIS — E119 Type 2 diabetes mellitus without complications: Secondary | ICD-10-CM | POA: Insufficient documentation

## 2015-08-03 DIAGNOSIS — I10 Essential (primary) hypertension: Secondary | ICD-10-CM | POA: Insufficient documentation

## 2015-08-03 DIAGNOSIS — M545 Low back pain: Secondary | ICD-10-CM | POA: Insufficient documentation

## 2015-08-03 HISTORY — DX: Unspecified osteoarthritis, unspecified site: M19.90

## 2015-08-03 MED ORDER — TRAMADOL HCL 50 MG PO TABS: 50.0000 mg | ORAL_TABLET | Freq: Four times a day (QID) | ORAL | Status: DC | PRN

## 2015-08-03 MED ORDER — KETOROLAC TROMETHAMINE 60 MG/2ML IM SOLN
30.0000 mg | Freq: Once | INTRAMUSCULAR | Status: AC
  Administered 2015-08-03: 30 mg via INTRAMUSCULAR
  Filled 2015-08-03: qty 2

## 2015-08-03 MED ORDER — MELOXICAM 7.5 MG PO TABS: 7.5000 mg | ORAL_TABLET | Freq: Every day | ORAL | Status: DC

## 2015-08-03 MED ORDER — HYDROMORPHONE HCL 1 MG/ML IJ SOLN
1.0000 mg | Freq: Once | INTRAMUSCULAR | Status: AC
  Administered 2015-08-03: 1 mg via INTRAMUSCULAR
  Filled 2015-08-03: qty 1

## 2015-08-03 NOTE — ED Notes (Signed)
Patient presents to the ED with chronic pain in back and arms.  Patient's significant other reports patient has been having difficulty sleeping due to the pain for the past several days.  Patient takes gabapentin and tramadol for pain but states it is not helping.  Patient states that the [facility] is working to get patient in to a pain management clinic but patient has not yet been accepted.  Patient denies any new pain today.

## 2015-08-03 NOTE — Discharge Instructions (Signed)
Continue [facility] for [facility] to [facility] management.

## 2015-08-03 NOTE — ED Provider Notes (Signed)
[facility] Emergency Department Provider Note   ____________________________________________  Time seen: Approximately 12:19 PM  I have reviewed the triage vital signs and the nursing notes.   HISTORY  Chief Complaint Pain    HPI Brandi Calderon is a 50 y.o. female patient complain of chronic low back pain which is worsened in the past 3 days. Patient states [facility]. Patient states she's been held off seeing pain management secondary to approval are Medicaid. Patient denies any radicular component to this pain. Denies any bladder or bowel dysfunction. Patient stated pain is not relieved with Neurontin and ibuprofen. Patient rated the pain today as a 10 over 10. Patient had x-rays 5 months ago was showing DJD of L5-S1 area. No other palliative measures for this complaint.   Past Medical History  Diagnosis Date  . Diabetes mellitus   . Hypertension   . Headache   . Back pain   . Arthritis     Patient Active Problem List   Diagnosis Date Noted  . Diabetes ([place]) 05/24/2015  . Back pain 05/24/2015    Past Surgical History  Procedure Laterality Date  . Other surgical history      3 c-sections  . Cesarean section      Current Outpatient Rx  Name  Route  Sig  Dispense  Refill  . acetaminophen (TYLENOL) 500 MG tablet   Oral   Take 2,000 mg by mouth 3 (three) times daily as needed (pain). Reported on 07/12/2015         . Aspirin-Salicylamide-Caffeine (BC HEADACHE POWDER PO)   Oral   Take 4 packets by mouth daily as needed (pain).         . citalopram (CELEXA) 20 MG tablet   Oral   Take 20 mg by mouth daily.         . cyclobenzaprine (FLEXERIL) 10 MG tablet   Oral   Take 1 tablet (10 mg total) by mouth 2 (two) times daily as needed for muscle spasms.   20 tablet   0   . gabapentin (NEURONTIN) 600 MG tablet   Oral   Take 600 mg by mouth 3 (three) times  daily.         [name] Kitchen glipiZIDE (GLUCOTROL) 5 MG tablet   Oral   Take by mouth daily before breakfast.         . glyBURIDE-metformin (GLUCOVANCE) 5-500 MG per tablet   Oral   Take 1 tablet by mouth 2 (two) times daily with a meal.   60 tablet   1   . ibuprofen (ADVIL,MOTRIN) 800 MG tablet   Oral   Take 1 tablet (800 mg total) by mouth 3 (three) times daily.   30 tablet   0   . Liraglutide (VICTOZA) 18 MG/3ML SOPN   Subcutaneous   Inject into the skin. Reported on 08/01/2015         . meloxicam (MOBIC) 7.5 MG tablet   Oral   Take 1 tablet (7.5 mg total) by mouth daily.   10 tablet   2   . metFORMIN (GLUCOPHAGE) 500 MG tablet   Oral   Take 1 tablet (500 mg total) by mouth 2 (two) times daily with a meal.   60 tablet   0   . naproxen (NAPROSYN) 500 MG tablet   Oral   Take 1 tablet (500 mg total) by mouth 2 (two)  times daily.   30 tablet   0   . Omega-3 Fatty Acids (FISH OIL PO)   Oral   Take 1 capsule by mouth daily.         [name] Kitchen oxyCODONE-acetaminophen (PERCOCET) 5-325 MG per tablet      1 to 2 tablets every 6 hours as needed for pain. Patient not taking: Reported on 08/01/2015   20 tablet   0   . Phenylephrine-DM-GG-APAP (MUCINEX FAST-MAX) 5-10-200-325 MG CAPS   Oral   Take 2 capsules by mouth 3 (three) times daily.   168 capsule   0   . traMADol (ULTRAM) 50 MG tablet   Oral   Take 1 tablet (50 mg total) by mouth every 6 (six) hours as needed. Patient not taking: Reported on 08/01/2015   12 tablet   0   . traMADol (ULTRAM) 50 MG tablet   Oral   Take 1 tablet (50 mg total) by mouth every 6 (six) hours as needed.   20 tablet   0   . traZODone (DESYREL) 50 MG tablet   Oral   Take 50 mg by mouth at bedtime.           Allergies Penicillins and Vicodin  No family history on file.  Social History Social History  Substance Use Topics  . Smoking status: Current Every Day Smoker -- 3.00 packs/day    Types: Cigarettes  . Smokeless tobacco: None    . Alcohol Use: No    Review of Systems Constitutional: No fever/chills Eyes: No visual changes. ENT: No sore throat. Cardiovascular: Denies chest pain. Respiratory: Denies shortness of breath. Gastrointestinal: No abdominal pain.  No nausea, no vomiting.  No diarrhea.  No constipation. Genitourinary: Negative for dysuria. Musculoskeletal: Chronic back pain  Skin: Negative for rash. Neurological: Negative for headaches, focal weakness or numbness. Psychiatric:Depression Allergic/Immunilogical: Vicodin and penicillin  ____________________________________________   PHYSICAL EXAM:  VITAL SIGNS: ED Triage Vitals  Enc Vitals Group     BP 08/03/15 1154 137/65 mmHg     Pulse Rate 08/03/15 1154 70     Resp 08/03/15 1154 18     Temp 08/03/15 1154 98.3 F (36.8 C)     Temp Source 08/03/15 1154 Oral     SpO2 08/03/15 1154 99 %     Weight 08/03/15 1154 176 lb (79.833 kg)     Height 08/03/15 1154 5\' 1"  (1.549 m)     Head Cir --      Peak Flow --      Pain Score 08/03/15 1154 10     Pain Loc --      Pain Edu? --      Excl. in [place]? --     Constitutional: Alert and oriented. Well appearing and in no acute distress. Eyes: Conjunctivae are normal. PERRL. EOMI. Head: Atraumatic. Nose: No congestion/rhinnorhea. Mouth/Throat: Mucous membranes are moist.  Oropharynx non-erythematous. Neck: No stridor.  No cervical spine tenderness to palpation. Hematological/Lymphatic/Immunilogical: No cervical lymphadenopathy. Cardiovascular: Normal rate, regular rhythm. Grossly normal heart sounds.  Good peripheral circulation. Respiratory: Normal respiratory effort.  No retractions. Lungs CTAB. Gastrointestinal: Soft and nontender. No distention. No abdominal bruits. No CVA tenderness. Musculoskeletal: No obvious deformity of the lumbar spine. Moderate guarding palpation L4-S1. Decreased range of motion with flexion and lateral movements. Patient has not Negative straight leg test. Neurologic:   Normal speech and language. No gross focal neurologic deficits are appreciated. No gait instability. Skin:  Skin is warm, dry and intact. No rash  noted. Psychiatric: Mood and affect are normal. Speech and behavior are normal.  ____________________________________________   LABS (all labs ordered are listed, but only abnormal results are displayed)  Labs Reviewed - No data to display ____________________________________________  EKG   ____________________________________________  RADIOLOGY   ____________________________________________   PROCEDURES  Procedure(s) performed: None  Critical Care performed: No  ____________________________________________   INITIAL IMPRESSION / ASSESSMENT AND PLAN / ED COURSE  Pertinent labs & imaging results that were available during my care of the patient were reviewed by me and considered in my medical decision making (see chart for details).  Chronic low back pain secondary to DJD.[name] Kitchen Patient given discharge Instructions. Patient [facility]. Patient given a prescription for meloxicam and tramadol. ____________________________________________   FINAL CLINICAL IMPRESSION(S) / ED DIAGNOSES  Final diagnoses:  Chronic back pain greater than 3 months duration  DJD (degenerative joint disease), lumbosacral      NEW MEDICATIONS STARTED DURING THIS VISIT:  New Prescriptions   MELOXICAM (MOBIC) 7.5 MG TABLET    Take 1 tablet (7.5 mg total) by mouth daily.   TRAMADOL (ULTRAM) 50 MG TABLET    Take 1 tablet (50 mg total) by mouth every 6 (six) hours as needed.     Note:  This document was prepared using Dragon voice recognition software and may include unintentional dictation errors.    Sable Feil, PA-C 08/03/15 [place], PA-C 08/03/15 1237  Lavonia Drafts, MD 08/03/15 1248

## 2015-08-03 NOTE — ED Notes (Signed)
Patient has been trying to get in with the pain clinic but is having to wait until her medicaid is finished being approved.  Patient states she has been in unbearable pain for 3 days and her medicaid is still pending. [facility] [place].

## 2015-08-04 LAB — PAP IG (IMAGE GUIDED): PAP SMEAR COMMENT: 0

## 2015-08-17 ENCOUNTER — Ambulatory Visit: Payer: Self-pay

## 2015-09-05 ENCOUNTER — Ambulatory Visit: Payer: Self-pay | Admitting: Nurse Practitioner

## 2015-09-05 VITALS — BP 130/69 | HR 66 | Temp 98.5°F | Wt 179.0 lb

## 2015-09-05 DIAGNOSIS — E119 Type 2 diabetes mellitus without complications: Secondary | ICD-10-CM

## 2015-09-05 LAB — GLUCOSE, POCT (MANUAL RESULT ENTRY): POC Glucose: 173 mg/dl — AB (ref 70–99)

## 2015-09-05 MED ORDER — NICOTINE 10 MG IN INHA: RESPIRATORY_TRACT | Status: DC

## 2015-09-05 NOTE — Progress Notes (Signed)
   Subjective:    Patient ID: Brandi Calderon, female    DOB: 1965/09/20, 50 y.o.   MRN: [id number]  HPI Dealing with multiple losses, son shot in [place] in the last year Loss of job with other financial losses.    Severe menstrual distress with heavy bleeding and passing of clots, has been seen by our gyn  Now smoking 4 packs of cigarettes per day, since her son was murdered in 02/16.  Is seen by psychiatrist, is on an antidepressant, and sees a therapist every wed.    Is interested in nicotrol inhaler.  States she wants to quit smoking, is spending $20 + per day on cigarettes.    Complaining of burning pain in her feet  Blood sugars up and down, drinks sweet tea and coca cola, though blood sugar at 5 p.m. 58.   Does not eat regularly scheduled meals.    Review of Systems     Objective:   Physical Exam        Assessment & Plan:    Will refer back to gyn to develop and move forward with treatment plan.  Will prescribe nicotrol inhalers, with sufficient quanity to help pt stop smoking 4 packs per day, she knows she can not smoke and use the nicotrol inhaler.    Diabetes have given instruction re:  No caloric fluids except for skim milk.  Instructions for eating regularly scheduled meals, to prevent  such drastic lows.    Neuropathy  Will increase gabapentin to 600 mg, 1.5 tablets three times a day

## 2015-09-12 ENCOUNTER — Other Ambulatory Visit: Payer: Self-pay

## 2015-09-19 ENCOUNTER — Ambulatory Visit: Payer: Self-pay | Admitting: Endocrinology

## 2015-09-19 VITALS — BP 126/73 | HR 67 | Ht 61.0 in

## 2015-09-19 DIAGNOSIS — E118 Type 2 diabetes mellitus with unspecified complications: Secondary | ICD-10-CM

## 2015-09-19 DIAGNOSIS — Z794 Long term (current) use of insulin: Principal | ICD-10-CM

## 2015-09-19 MED ORDER — METFORMIN HCL 1000 MG PO TABS
1000.0000 mg | ORAL_TABLET | Freq: Two times a day (BID) | ORAL | Status: DC
Start: 2015-09-19 — End: 2016-09-24

## 2015-09-19 MED ORDER — GLIPIZIDE 5 MG PO TABS: 5.0000 mg | ORAL_TABLET | Freq: Two times a day (BID) | ORAL | Status: DC

## 2015-09-19 NOTE — Progress Notes (Signed)
Assessment:   1. Type 2 diabetes mellitus with complication, with long-term current use of insulin (HCC)  Control sounds improved. Has numerous boxes of Victoza left and would like to try again.  Refilled:  - metFORMIN (GLUCOPHAGE) 1000 MG tablet; Take 1 tablet (1,000 mg total) by mouth 2 (two) times daily with a meal.  Dispense: 180 tablet; Refill: 3 - glipiZIDE (GLUCOTROL) 5 MG tablet; Take 1 tablet (5 mg total) by mouth 2 (two) times daily before a meal.  Dispense: 180 tablet; Refill: 3      Plan:    1.  Rx changes: Try only 2-3 clicks of Victoza (don't go all the way to 0.6 mg to start). Once tolerating, increase by a click per day.  -   Reviewed importance of good glycemic control to reduce risk for complications.   3.   Get regular physical activity at least 30 minutes a day of moderate intensity most days of the week as allowed.   This discussion took at least 15 minutes out of a total visit time of 25 minutes today.  4. Follow up: I recommend patient follow-up with me at 3 months.   5. Referrals: Patient is seeking to be seen by pain clinic for back pain.   6. Counseled the patients to slowly titrate her Victoza up to therapuetic dose because it was causing [place]. Patient agreed and will slowly titrate up and see how she tolerates.       Subjective:    Brandi Calderon is a 50 y.o. female who is seen in follow up forType 2 diabetes from [id number]  complicated by [id number] and was recently diagnosed with chronic back pain, degenerative joint disease -- lumbosacral.   Cardiovascular risk factors: tobacco use -- 4 ppd. Trying to quit.   Eye exam current (within one year): Needs to be scheduled    Patient's glucometer was not downloaded and reviewed SMBG values were not reviewed with the patient.  Brandi Calderon is performing SMBG on average 2 times per day.   Self reported verage over last 135 days is 167.   Reports moderate symptoms of hypoglycemia.  Reported self low of 58 and had "shakes" and seeing "spots". Ate Reeses to [employer]. Doesn't know what precipated this event (ate breakfast).   Acknowledges polyuria, some polyphagia, shortness of breath.  Denies weight loss or gain.  Denies chest pain. Trouble walking but attributes most to back pain.   Denies severe hypoglycemia or admission to hospital for DKA.  Current exercise: Unable because of back-pain.   The patient's history was reviewed and updated as appropriate.  Allergies  Allergen Reactions  . Penicillins Anaphylaxis    Throat swells  . Vicodin [Hydrocodone-Acetaminophen] Hives     Current outpatient prescriptions:  .  Aspirin-Salicylamide-Caffeine (BC HEADACHE POWDER PO), Take 4 packets by mouth daily as needed (pain)., Disp: , Rfl:  .  citalopram (CELEXA) 20 MG tablet, Take 40 mg by mouth daily. , Disp: , Rfl:  .  gabapentin (NEURONTIN) 600 MG tablet, Take 600 mg by mouth 3 (three) times daily. Take 1 tablets three times per day, Disp: , Rfl:  .  glipiZIDE (GLUCOTROL) 5 MG tablet, Take by mouth 2 (two) times daily before a meal. , Disp: , Rfl:  .  ibuprofen (ADVIL,MOTRIN) 800 MG tablet, Take 1 tablet (800 mg total) by mouth 3 (three) times daily., Disp: 30 tablet, Rfl: 0 .  insulin detemir (LEVEMIR) 100 UNIT/ML injection, Inject 10 Units into the skin at  bedtime., Disp: , Rfl:  .  Melatonin 1 MG TABS, Take 1 tablet by mouth at bedtime as needed., Disp: , Rfl:  .  metFORMIN (GLUCOPHAGE) 1000 MG tablet, Take 1,000 mg by mouth 2 (two) times daily with a meal., Disp: , Rfl:  .  nicotine (NICOTROL) 10 MG inhaler, May begin with 12 cartridges inhaled per day, gradually decreasing over time., Disp: 336 each, Rfl: 0 .  acetaminophen (TYLENOL) 500 MG tablet, Take 2,000 mg by mouth 3 (three) times daily as needed (pain). Reported on 09/19/2015, Disp: , Rfl:  .  cyclobenzaprine (FLEXERIL) 10 MG tablet, Take 1 tablet (10 mg total) by mouth 2 (two) times daily as needed for muscle  spasms. (Patient not taking: Reported on 09/19/2015), Disp: 20 tablet, Rfl: 0 .  Liraglutide (VICTOZA) 18 MG/3ML SOPN, Inject into the skin. Reported on 09/19/2015, Disp: , Rfl:  .  Omega-3 Fatty Acids (FISH OIL PO), Take 1 capsule by mouth daily. Reported on 09/19/2015, Disp: , Rfl:  .  oxyCODONE-acetaminophen (PERCOCET) 5-325 MG per tablet, 1 to 2 tablets every 6 hours as needed for pain. (Patient not taking: Reported on 08/01/2015), Disp: 20 tablet, Rfl: 0 .  traZODone (DESYREL) 50 MG tablet, Take 50 mg by mouth at bedtime. Reported on 09/19/2015, Disp: , Rfl:   Social History   Social History  . Marital Status: Married    Spouse Name: N/A  . Number of Children: N/A  . Years of Education: N/A   Social History Main Topics  . Smoking status: Current Every Day Smoker -- 3.00 packs/day    Types: Cigarettes  . Smokeless tobacco: Not on file  . Alcohol Use: No  . Drug Use: No  . Sexual Activity: Not on file   Other Topics Concern  . Not on file   Social History Narrative    No family history on file.  Review of Systems A 12 point review of systems was negative except for pertinent items noted in the HPI.   Objective:     Wt Readings from Last 3 Encounters:  09/05/15 179 lb (81.194 kg)  08/03/15 176 lb (79.833 kg)  08/01/15 175 lb (79.379 kg)   There were no vitals taken for this visit.  General appearance:  alert and appears stated age, in no distress      Eyes:  conjunctivae/corneas clear. EOM's intact. Sclera anicteric. Negative lid lag or proptosis     Neck: no adenopathy, supple, symmetrical, trachea midline  Thyroid:  Mobile, normal size, no palpable nodule  Lung: clear to auscultation bilaterally  Heart:  regular rate and rhythm, S1, S2 normal, no murmur, click, rub or gallop  Abdomen:  bowel sounds normal; negative bruits  Extremities: extremities normal, atraumatic, no cyanosis or edema     Pulses: DP & PT 2+ and symmetric.  Neuro: normal without focal findings,  mental status, speech normal, alert and oriented x3, reflexes normal and symmetric and gait normal.   Feet: negative lesions or ulcers, 10 gram monofilament intact bilateral plantar surface     Lab Review No components found for: A1C GLUCOSE (mg/dL)  Date Value  06/28/2015 199*  12/07/2012 274*  04/20/2011 119*   GLUCOSE, BLD (mg/dL)  Date Value  02/21/2015 255*  05/09/2010 310*   CO2 (mmol/L)  Date Value  06/28/2015 26  02/21/2015 29  12/07/2012 25  04/20/2011 28   BUN (mg/dL)  Date Value  06/28/2015 11  04/05/2015 8  02/21/2015 10  12/07/2012 10  04/20/2011 11  05/09/2010 9   CREATININE (mg/dL)  Date Value  04/05/2015 0.6  12/07/2012 0.63  04/20/2011 0.53*   CREATININE, SER (mg/dL)  Date Value  06/28/2015 0.59  02/21/2015 0.55  05/09/2010 0.7   No components found for: LDL,  LDLCALC,  LDLDIRECT Lab Results  Component Value Date   NA 140 06/28/2015   K 4.5 06/28/2015   CL 98 06/28/2015   CO2 26 06/28/2015   BUN 11 06/28/2015   CREATININE 0.59 06/28/2015   GFRAA 126 06/28/2015   GFRNONAA 109 06/28/2015   GLU 288 04/05/2015   CALCIUM 9.1 06/28/2015   ALBUMIN 4.0 02/21/2015     DIABETES MELLITUS RESULTS: Lab Results  Component Value Date   HGBA1C 10.4* 06/28/2015   HGBA1C 11.6 04/05/2015   Lab Results  Component Value Date   LDLCALC 149 04/05/2015   CREATININE 0.59 06/28/2015   Lab Results  Component Value Date   CHOL 211* 04/05/2015   Lab Results  Component Value Date   LDLCALC 149 04/05/2015   No components found for: CHOLLLDLDIRECT No components found for: MICROALB/CR Lab Results  Component Value Date   GFRAA 126 06/28/2015   GFRNONAA 109 06/28/2015

## 2015-09-27 ENCOUNTER — Ambulatory Visit: Payer: Self-pay | Admitting: Ophthalmology

## 2015-09-28 ENCOUNTER — Ambulatory Visit: Payer: Self-pay | Admitting: Obstetrics & Gynecology

## 2015-09-28 DIAGNOSIS — N920 Excessive and frequent menstruation with regular cycle: Secondary | ICD-10-CM | POA: Insufficient documentation

## 2015-09-28 DIAGNOSIS — E119 Type 2 diabetes mellitus without complications: Secondary | ICD-10-CM

## 2015-09-28 NOTE — Progress Notes (Signed)
Subjective:     Patient ID: Brandi Calderon, female   DOB: 1965/08/12, 50 y.o.   MRN: [id number]  HPI Comments: Subjective:   Brandi Calderon is a 50 y.o. woman who presents for irregular menses. Currently on LMP Periods are regular every 28-30 days, lasting 10 days. Dysmenorrhea:moderate, occurring throughout menses. Cyclic symptoms include: bloating and fluid retention. Current contraception: none.History of infertility: no. History of abnormal Pap smear: no.  The following portions of the patient's history were reviewed and updated as appropriate: allergies, current medications, past family history, past medical history, past social history, past surgical history and problem list.  Review of Systems Pertinent items are noted in HPI.    Objective:  No exam performed today, (follow up for PAP and [place]).   Assessment:  The patient has menometrorrhagia.   Plan:  Diagnosis explained in detail. All questions answered. Pelvic ultrasound. Pt has been unable ot obtain as of yet.  Encouraged to do so soon.    Options- surgery ([facility]); hormonal therapy.  Consider EMB if thickened endometrium.    Provera Rx given today to control current episode of bleeding. PAP discussed, normal.        Review of Systems As above    Objective:   Physical Exam  As above     Assessment:     As above     Plan:     As above

## 2015-09-29 LAB — COMPREHENSIVE METABOLIC PANEL
ALK PHOS: 102 IU/L (ref 39–117)
ALT: 12 IU/L (ref 0–32)
AST: 10 IU/L (ref 0–40)
Albumin/Globulin Ratio: 1.8 (ref 1.2–2.2)
Albumin: 4.6 g/dL (ref 3.5–5.5)
BUN / CREAT RATIO: 24 — AB (ref 9–23)
BUN: 16 mg/dL (ref 6–24)
CHLORIDE: 98 mmol/L (ref 96–106)
CO2: 25 mmol/L (ref 18–29)
Calcium: 9.5 mg/dL (ref 8.7–10.2)
Creatinine, Ser: 0.67 mg/dL (ref 0.57–1.00)
GFR calc non Af Amer: 104 mL/min/{1.73_m2} (ref >59)
GFR, EST AFRICAN AMERICAN: 120 mL/min/{1.73_m2} (ref >59)
GLUCOSE: 142 mg/dL — AB (ref 65–99)
Globulin, Total: 2.6 g/dL (ref 1.5–4.5)
POTASSIUM: 4.6 mmol/L (ref 3.5–5.2)
SODIUM: 141 mmol/L (ref 134–144)
TOTAL PROTEIN: 7.2 g/dL (ref 6.0–8.5)

## 2015-09-29 LAB — CBC WITH DIFFERENTIAL/PLATELET
Basophils Absolute: 0.1 10*3/uL (ref 0.0–0.2)
Basos: 1 %
EOS (ABSOLUTE): 0.3 10*3/uL (ref 0.0–0.4)
EOS: 3 %
HEMATOCRIT: 38.5 % (ref 34.0–46.6)
HEMOGLOBIN: 12.5 g/dL (ref 11.1–15.9)
IMMATURE GRANULOCYTES: 0 %
Immature Grans (Abs): 0 10*3/uL (ref 0.0–0.1)
LYMPHS: 32 %
Lymphocytes Absolute: 3.4 10*3/uL — ABNORMAL HIGH (ref 0.7–3.1)
MCH: 27.6 pg (ref 26.6–33.0)
MCHC: 32.5 g/dL (ref 31.5–35.7)
MCV: 85 fL (ref 79–97)
MONOCYTES: 6 %
Monocytes Absolute: 0.6 10*3/uL (ref 0.1–0.9)
NEUTROS PCT: 58 %
Neutrophils Absolute: 6.4 10*3/uL (ref 1.4–7.0)
Platelets: 314 10*3/uL (ref 150–379)
RBC: 4.53 x10E6/uL (ref 3.77–5.28)
RDW: 15.9 % — AB (ref 12.3–15.4)
WBC: 10.8 10*3/uL (ref 3.4–10.8)

## 2015-09-29 LAB — LIPID PANEL
CHOLESTEROL TOTAL: 189 mg/dL (ref 100–199)
Chol/HDL Ratio: 5.4 ratio units — ABNORMAL HIGH (ref 0.0–4.4)
HDL: 35 mg/dL — ABNORMAL LOW (ref >39)
LDL CALC: 116 mg/dL — AB (ref 0–99)
TRIGLYCERIDES: 188 mg/dL — AB (ref 0–149)
VLDL CHOLESTEROL CAL: 38 mg/dL (ref 5–40)

## 2015-09-29 LAB — HEMOGLOBIN A1C
ESTIMATED AVERAGE GLUCOSE: 163 mg/dL
Hgb A1c MFr Bld: 7.3 % — ABNORMAL HIGH (ref 4.8–5.6)

## 2015-10-23 ENCOUNTER — Ambulatory Visit: Payer: PRIVATE HEALTH INSURANCE

## 2015-10-23 ENCOUNTER — Other Ambulatory Visit: Payer: Self-pay | Admitting: Obstetrics & Gynecology

## 2015-10-23 DIAGNOSIS — N920 Excessive and frequent menstruation with regular cycle: Secondary | ICD-10-CM

## 2015-10-25 ENCOUNTER — Ambulatory Visit
Admission: RE | Admit: 2015-10-25 | Discharge: 2015-10-25 | Disposition: A | Discharge status: Home / Self Care | Payer: Self-pay | Source: Ambulatory Visit | Attending: Obstetrics & Gynecology | Admitting: Obstetrics & Gynecology

## 2015-10-25 DIAGNOSIS — N329 Bladder disorder, unspecified: Secondary | ICD-10-CM | POA: Insufficient documentation

## 2015-10-25 DIAGNOSIS — D259 Leiomyoma of uterus, unspecified: Secondary | ICD-10-CM | POA: Insufficient documentation

## 2015-10-25 DIAGNOSIS — N920 Excessive and frequent menstruation with regular cycle: Secondary | ICD-10-CM | POA: Insufficient documentation

## 2015-11-06 NOTE — Telephone Encounter (Signed)
error 

## 2015-11-07 ENCOUNTER — Other Ambulatory Visit: Payer: Self-pay

## 2015-11-07 DIAGNOSIS — E1041 Type 1 diabetes mellitus with diabetic mononeuropathy: Secondary | ICD-10-CM

## 2015-11-08 LAB — COMPREHENSIVE METABOLIC PANEL WITH GFR
ALT: 14 IU/L (ref 0–32)
AST: 13 IU/L (ref 0–40)
Albumin/Globulin Ratio: 1.8 (ref 1.2–2.2)
Albumin: 4.5 g/dL (ref 3.5–5.5)
Alkaline Phosphatase: 100 IU/L (ref 39–117)
BUN/Creatinine Ratio: 24 — ABNORMAL HIGH (ref 9–23)
BUN: 15 mg/dL (ref 6–24)
Bilirubin Total: <0.2 mg/dL (ref 0.0–1.2)
CO2: 25 mmol/L (ref 18–29)
Calcium: 9.5 mg/dL (ref 8.7–10.2)
Chloride: 100 mmol/L (ref 96–106)
Creatinine, Ser: 0.63 mg/dL (ref 0.57–1.00)
GFR calc Af Amer: 123 mL/min/1.73
GFR calc non Af Amer: 106 mL/min/1.73
Globulin, Total: 2.5 g/dL (ref 1.5–4.5)
Glucose: 171 mg/dL — ABNORMAL HIGH (ref 65–99)
Potassium: 4.5 mmol/L (ref 3.5–5.2)
Sodium: 141 mmol/L (ref 134–144)
Total Protein: 7 g/dL (ref 6.0–8.5)

## 2015-11-08 LAB — HEMOGLOBIN A1C
Est. average glucose Bld gHb Est-mCnc: 192 mg/dL
Hgb A1c MFr Bld: 8.3 % — ABNORMAL HIGH (ref 4.8–5.6)

## 2015-11-08 LAB — LIPID PANEL
CHOLESTEROL TOTAL: 189 mg/dL (ref 100–199)
Chol/HDL Ratio: 6.3 ratio units — ABNORMAL HIGH (ref 0.0–4.4)
HDL: 30 mg/dL — AB (ref >39)
LDL Calculated: 130 mg/dL — ABNORMAL HIGH (ref 0–99)
TRIGLYCERIDES: 146 mg/dL (ref 0–149)
VLDL Cholesterol Cal: 29 mg/dL (ref 5–40)

## 2015-11-08 LAB — CBC WITH DIFFERENTIAL/PLATELET
Basophils Absolute: 0 x10E3/uL (ref 0.0–0.2)
Basos: 0 %
EOS (ABSOLUTE): 0.3 x10E3/uL (ref 0.0–0.4)
Eos: 3 %
Hematocrit: 39.9 % (ref 34.0–46.6)
Hemoglobin: 12.9 g/dL (ref 11.1–15.9)
Immature Grans (Abs): 0 x10E3/uL (ref 0.0–0.1)
Immature Granulocytes: 0 %
Lymphocytes Absolute: 3.6 x10E3/uL — ABNORMAL HIGH (ref 0.7–3.1)
Lymphs: 32 %
MCH: 27.4 pg (ref 26.6–33.0)
MCHC: 32.3 g/dL (ref 31.5–35.7)
MCV: 85 fL (ref 79–97)
Monocytes Absolute: 0.7 x10E3/uL (ref 0.1–0.9)
Monocytes: 6 %
Neutrophils Absolute: 6.5 x10E3/uL (ref 1.4–7.0)
Neutrophils: 59 %
Platelets: 290 x10E3/uL (ref 150–379)
RBC: 4.7 x10E6/uL (ref 3.77–5.28)
RDW: 16 % — ABNORMAL HIGH (ref 12.3–15.4)
WBC: 11.1 x10E3/uL — ABNORMAL HIGH (ref 3.4–10.8)

## 2015-11-11 ENCOUNTER — Encounter: Payer: Self-pay | Admitting: Radiology

## 2015-11-11 ENCOUNTER — Emergency Department
Admission: EM | Admit: 2015-11-11 | Discharge: 2015-11-12 | Discharge status: Against Medical Advice | Payer: Self-pay | Attending: Emergency Medicine | Admitting: Emergency Medicine

## 2015-11-11 ENCOUNTER — Emergency Department: Payer: Self-pay

## 2015-11-11 DIAGNOSIS — R51 Headache: Secondary | ICD-10-CM | POA: Insufficient documentation

## 2015-11-11 DIAGNOSIS — Z79899 Other long term (current) drug therapy: Secondary | ICD-10-CM | POA: Insufficient documentation

## 2015-11-11 DIAGNOSIS — F419 Anxiety disorder, unspecified: Secondary | ICD-10-CM | POA: Insufficient documentation

## 2015-11-11 DIAGNOSIS — Z7984 Long term (current) use of oral hypoglycemic drugs: Secondary | ICD-10-CM | POA: Insufficient documentation

## 2015-11-11 DIAGNOSIS — F17210 Nicotine dependence, cigarettes, uncomplicated: Secondary | ICD-10-CM | POA: Insufficient documentation

## 2015-11-11 DIAGNOSIS — R519 Headache, unspecified: Secondary | ICD-10-CM

## 2015-11-11 DIAGNOSIS — D72829 Elevated white blood cell count, unspecified: Secondary | ICD-10-CM | POA: Insufficient documentation

## 2015-11-11 DIAGNOSIS — Z791 Long term (current) use of non-steroidal anti-inflammatories (NSAID): Secondary | ICD-10-CM | POA: Insufficient documentation

## 2015-11-11 DIAGNOSIS — Z7982 Long term (current) use of aspirin: Secondary | ICD-10-CM | POA: Insufficient documentation

## 2015-11-11 DIAGNOSIS — R404 Transient alteration of awareness: Secondary | ICD-10-CM | POA: Insufficient documentation

## 2015-11-11 DIAGNOSIS — I10 Essential (primary) hypertension: Secondary | ICD-10-CM | POA: Insufficient documentation

## 2015-11-11 LAB — TROPONIN I

## 2015-11-11 LAB — URINE DRUG SCREEN, QUALITATIVE (ARMC ONLY)
Amphetamines, Ur Screen: NOT DETECTED
BARBITURATES, UR SCREEN: NOT DETECTED
Benzodiazepine, Ur Scrn: NOT DETECTED
CANNABINOID 50 NG, UR ~~LOC~~: POSITIVE — AB
COCAINE METABOLITE, UR ~~LOC~~: NOT DETECTED
MDMA (Ecstasy)Ur Screen: NOT DETECTED
Methadone Scn, Ur: NOT DETECTED
Opiate, Ur Screen: NOT DETECTED
Phencyclidine (PCP) Ur S: NOT DETECTED
TRICYCLIC, UR SCREEN: NOT DETECTED

## 2015-11-11 LAB — URINALYSIS COMPLETE WITH MICROSCOPIC (ARMC ONLY)
Bacteria, UA: NONE SEEN
Bilirubin Urine: NEGATIVE
Glucose, UA: >500 mg/dL — AB
HGB URINE DIPSTICK: NEGATIVE
LEUKOCYTES UA: NEGATIVE
NITRITE: NEGATIVE
PH: 7 (ref 5.0–8.0)
PROTEIN: NEGATIVE mg/dL
SPECIFIC GRAVITY, URINE: 1.021 (ref 1.005–1.030)
WBC, UA: NONE SEEN WBC/hpf (ref 0–5)

## 2015-11-11 LAB — BLOOD GAS, VENOUS
Acid-Base Excess: 1.2 mmol/L (ref 0.0–3.0)
BICARBONATE: 26.6 meq/L (ref 21.0–28.0)
O2 Saturation: 66.6 %
PATIENT TEMPERATURE: 37
pCO2, Ven: 44 mmHg (ref 44.0–60.0)
pH, Ven: 7.39 (ref 7.320–7.430)
pO2, Ven: 35 mmHg (ref 31.0–45.0)

## 2015-11-11 LAB — CBC WITH DIFFERENTIAL/PLATELET
BASOS ABS: 0.1 10*3/uL (ref 0–0.1)
BASOS PCT: 1 %
Basophils Absolute: 0.1 10*3/uL (ref 0–0.1)
Basophils Relative: 1 %
EOS ABS: 0.1 10*3/uL (ref 0–0.7)
EOS ABS: 0 10*3/uL (ref 0–0.7)
EOS PCT: 1 %
Eosinophils Relative: 0 %
HCT: 41.3 % (ref 35.0–47.0)
HCT: 38.8 % (ref 35.0–47.0)
HEMOGLOBIN: 12.8 g/dL (ref 12.0–16.0)
Hemoglobin: 13.7 g/dL (ref 12.0–16.0)
LYMPHS ABS: 2.4 10*3/uL (ref 1.0–3.6)
LYMPHS PCT: 13 %
Lymphocytes Relative: 8 %
Lymphs Abs: 1.3 10*3/uL (ref 1.0–3.6)
MCH: 27.8 pg (ref 26.0–34.0)
MCH: 27.5 pg (ref 26.0–34.0)
MCHC: 33.2 g/dL (ref 32.0–36.0)
MCHC: 33 g/dL (ref 32.0–36.0)
MCV: 83.6 fL (ref 80.0–100.0)
MCV: 83.5 fL (ref 80.0–100.0)
MONO ABS: 0.8 10*3/uL (ref 0.2–0.9)
MONO ABS: 0.6 10*3/uL (ref 0.2–0.9)
MONOS PCT: 4 %
Monocytes Relative: 5 %
NEUTROS PCT: 80 %
NEUTROS PCT: 87 %
Neutro Abs: 14.2 10*3/uL — ABNORMAL HIGH (ref 1.4–6.5)
Neutro Abs: 13.4 10*3/uL — ABNORMAL HIGH (ref 1.4–6.5)
PLATELETS: 260 10*3/uL (ref 150–440)
PLATELETS: 238 10*3/uL (ref 150–440)
RBC: 4.94 MIL/uL (ref 3.80–5.20)
RBC: 4.65 MIL/uL (ref 3.80–5.20)
RDW: 16 % — AB (ref 11.5–14.5)
RDW: 16.2 % — AB (ref 11.5–14.5)
WBC: 17.6 10*3/uL — AB (ref 3.6–11.0)
WBC: 15.3 10*3/uL — ABNORMAL HIGH (ref 3.6–11.0)

## 2015-11-11 LAB — GLUCOSE, CAPILLARY: GLUCOSE-CAPILLARY: 198 mg/dL — AB (ref 65–99)

## 2015-11-11 LAB — COMPREHENSIVE METABOLIC PANEL
ALT: 14 U/L (ref 14–54)
AST: 15 U/L (ref 15–41)
Albumin: 4.2 g/dL (ref 3.5–5.0)
Alkaline Phosphatase: 90 U/L (ref 38–126)
Anion gap: 11 (ref 5–15)
BUN: 14 mg/dL (ref 6–20)
CHLORIDE: 102 mmol/L (ref 101–111)
CO2: 23 mmol/L (ref 22–32)
Calcium: 9.3 mg/dL (ref 8.9–10.3)
Creatinine, Ser: 0.66 mg/dL (ref 0.44–1.00)
Glucose, Bld: 195 mg/dL — ABNORMAL HIGH (ref 65–99)
POTASSIUM: 3.8 mmol/L (ref 3.5–5.1)
SODIUM: 136 mmol/L (ref 135–145)
Total Bilirubin: 0.5 mg/dL (ref 0.3–1.2)
Total Protein: 7.5 g/dL (ref 6.5–8.1)

## 2015-11-11 LAB — ETHANOL

## 2015-11-11 MED ORDER — IOPAMIDOL (ISOVUE-370) INJECTION 76%
75.0000 mL | Freq: Once | INTRAVENOUS | Status: AC | PRN
  Administered 2015-11-11: 75 mL via INTRAVENOUS

## 2015-11-11 MED ORDER — METOCLOPRAMIDE HCL 5 MG/ML IJ SOLN
10.0000 mg | Freq: Once | INTRAMUSCULAR | Status: AC
  Administered 2015-11-11: 10 mg via INTRAVENOUS
  Filled 2015-11-11: qty 2

## 2015-11-11 MED ORDER — SODIUM CHLORIDE 0.9 % IV SOLN
1000.0000 mL | Freq: Once | INTRAVENOUS | Status: AC
  Administered 2015-11-11: 1000 mL via INTRAVENOUS

## 2015-11-11 MED ORDER — KETOROLAC TROMETHAMINE 30 MG/ML IJ SOLN
30.0000 mg | Freq: Once | INTRAMUSCULAR | Status: AC
  Administered 2015-11-11: 30 mg via INTRAVENOUS
  Filled 2015-11-11: qty 1

## 2015-11-11 MED ORDER — LORAZEPAM 2 MG/ML IJ SOLN
1.0000 mg | Freq: Once | INTRAMUSCULAR | Status: AC
  Administered 2015-11-11: 1 mg via INTRAVENOUS
  Filled 2015-11-11: qty 1

## 2015-11-11 MED ORDER — ONDANSETRON HCL 4 MG/2ML IJ SOLN
4.0000 mg | Freq: Once | INTRAMUSCULAR | Status: AC
  Administered 2015-11-11: 4 mg via INTRAVENOUS
  Filled 2015-11-11: qty 2

## 2015-11-11 NOTE — ED Notes (Signed)
Pt resting in bed at this time. NAD noted. Respirations even and unlabored at this time. Will continue to monitor for further patient needs.

## 2015-11-11 NOTE — ED Provider Notes (Addendum)
[facility] Emergency Department Provider Note   L5 caveat: Review of systems and history is limited by altered mental status     Time seen: ----------------------------------------- 6:55 PM on 11/11/2015 -----------------------------------------    I have reviewed the triage vital signs and the nursing notes.   HISTORY  Chief Complaint Altered Mental Status    HPI Brandi Calderon is a 50 y.o. female brought the ER by private vehicle for vomiting and diarrhea associated with near syncope and shaking. According to the family she was throwing up and having diarrhea at home. She possibly had a syncopal event. She was also shaking, she is complaining of headache. She is brought in minimally responsive from her car. Patient denies recent illness, symptoms started today. Reportedly she is diabetic.   Past Medical History:  Diagnosis Date  . Arthritis   . Back pain   . Diabetes mellitus   . Headache   . Hypertension     Patient Active Problem List   Diagnosis Date Noted  . Menorrhagia 09/28/2015  . Diabetes ([place]) 05/24/2015  . Back pain 05/24/2015    Past Surgical History:  Procedure Laterality Date  . CESAREAN SECTION    . OTHER SURGICAL HISTORY     3 c-sections    Allergies Penicillins and Vicodin [hydrocodone-acetaminophen]  Social History Social History  Substance Use Topics  . Smoking status: Current Every Day Smoker    Packs/day: 3.00    Types: Cigarettes  . Smokeless tobacco: Not on file  . Alcohol use No   Review of Systems Gastrointestinal: Negative for abdominal pain, Positive for vomiting and diarrhea Neurological: Negative for headaches, positive for weakness  Review of systems otherwise unknown at this time  ____________________________________________   PHYSICAL EXAM:  VITAL SIGNS: ED Triage Vitals [11/11/15 1854]  Enc Vitals Group     BP 112/60     Pulse Rate (!) 58     Resp      Temp 97.9 F (36.6 C)      Temp Source Oral     SpO2      Weight      Height      Head Circumference      Peak Flow      Pain Score      Pain Loc      Pain Edu?      Excl. in [place]?     Constitutional: Alert But disoriented. Mild distress, anxious Eyes: Conjunctivae are normal. PERRL. Normal extraocular movements. ENT   Head: Normocephalic and atraumatic.   Nose: No congestion/rhinnorhea.   Mouth/Throat: Mucous membranes are moist.   Neck: No stridor. Cardiovascular: Normal rate, regular rhythm. No murmurs, rubs, or gallops. Respiratory: Normal respiratory effort without tachypnea nor retractions. Breath sounds are clear and equal bilaterally. No wheezes/rales/rhonchi. Gastrointestinal: Soft and nontender. Normal bowel sounds Musculoskeletal: Nontender with normal range of motion in all extremities. No lower extremity tenderness nor edema. Neurologic:  Normal speech and language. No gross focal neurologic deficits are appreciated but patient seems confused Skin:  Skin is warm, dry and intact. No rash noted. Psychiatric: Anxious mood and affect ____________________________________________  EKG: Interpreted by me. Sinus rhythm with a rate of 57 bpm, normal PR interval, normal QRS, normal QT interval. Normal axis.  ____________________________________________  ED COURSE:  Pertinent labs & imaging results that were available during my care of the patient were reviewed by me and considered in my medical decision making (see chart for details). Clinical Course  Comment By  Time  No clear etiology for her headache is identified at this time. She has declined lumbar puncture despite being advised of the risk of meningitis. I have advised that I'm unclear as to the cause of her headache at this time. I have ordered a CT angiogram Earleen Newport, MD 08/12 2201  Patient presents with gastroenteritis symptoms plus possible syncope and appears anxious. We will check with basic labs, CT  imaging.  Procedures ____________________________________________   LABS (pertinent positives/negatives)  Labs Reviewed  GLUCOSE, CAPILLARY - Abnormal; Notable for the following:       Result Value   Glucose-Capillary 198 (*)    All other components within normal limits  CBC WITH DIFFERENTIAL/PLATELET - Abnormal; Notable for the following:    WBC 17.6 (*)    RDW 16.0 (*)    Neutro Abs 14.2 (*)    All other components within normal limits  COMPREHENSIVE METABOLIC PANEL - Abnormal; Notable for the following:    Glucose, Bld 195 (*)    All other components within normal limits  URINALYSIS COMPLETEWITH MICROSCOPIC ([facility] ONLY) - Abnormal; Notable for the following:    Color, Urine YELLOW (*)    APPearance CLEAR (*)    Glucose, UA >500 (*)    Ketones, ur 2+ (*)    Squamous Epithelial / LPF 0-5 (*)    All other components within normal limits  URINE DRUG SCREEN, QUALITATIVE ([facility] ONLY) - Abnormal; Notable for the following:    Cannabinoid 50 Ng, Ur  POSITIVE (*)    All other components within normal limits  CBC WITH DIFFERENTIAL/PLATELET - Abnormal; Notable for the following:    WBC 15.3 (*)    RDW 16.2 (*)    Neutro Abs 13.4 (*)    All other components within normal limits  TROPONIN I  BLOOD GAS, VENOUS  ETHANOL    RADIOLOGY Images were viewed by me  CT head Is unremarkable CT angiogram of the head IMPRESSION: 1. Negative CTA of the brain. No acute vascular abnormality identified within the intracranial circulation. No aneurysm or vascular malformation. 2. Mild scattered plaque within the cavernous right ICA with mild multifocal narrowing.   ____________________________________________  FINAL ASSESSMENT AND PLAN  Altered mental status, vomiting, anxiety, headache  Plan: Patient with labs and imaging as dictated above. Patient is in no acute distress, she may have gastroenteritis. She presents more with a panic attack type picture. Family member notes that  her son died this past year and she's had a lot of anxiety from it. Her white count is likely from vomiting and stress response. Patient is leaving [facility], she is refusing lumbar puncture here. I cannot assess her for meningitis. Her repeat blood count looks better. She currently states her headache is improving. I've advised her to please return tomorrow if her symptoms worsen for lumbar puncture. Patient agrees to come back.   Earleen Newport, MD   Note: This dictation was prepared with Dragon dictation. Any transcriptional errors that result from this process are unintentional    Earleen Newport, MD 11/11/15 [place], MD 11/11/15 [phone]

## 2015-11-11 NOTE — ED Notes (Signed)
Pt awakens and states "my head hurts so bad", when this RN goes to room to give HA medication. Prior to RN arrival, pt had been resting in bed with eyes closed, respirations even and unlabored. Pt's daughter at bedside at this time.

## 2015-11-11 NOTE — ED Notes (Signed)
Dr Jimmye Norman at bedside to followup; pt with snoring respirations, easily awakened by MD;

## 2015-11-11 NOTE — ED Triage Notes (Signed)
Per daughter pt was vomiting and shaking and wasn't acting herself.

## 2015-11-11 NOTE — ED Notes (Signed)
Report given to Laurie, RN.

## 2015-11-12 NOTE — ED Notes (Signed)
Pt discharged home by April, RN; e-signature obtained by Modena Nunnery, RN;

## 2015-11-15 ENCOUNTER — Encounter: Payer: Self-pay | Admitting: Internal Medicine

## 2015-11-15 ENCOUNTER — Ambulatory Visit: Payer: Self-pay | Admitting: Internal Medicine

## 2015-11-15 VITALS — BP 128/75 | HR 53 | Temp 98.7°F | Wt 162.0 lb

## 2015-11-15 DIAGNOSIS — Z794 Long term (current) use of insulin: Principal | ICD-10-CM

## 2015-11-15 DIAGNOSIS — E119 Type 2 diabetes mellitus without complications: Secondary | ICD-10-CM

## 2015-11-15 LAB — GLUCOSE, POCT (MANUAL RESULT ENTRY): POC GLUCOSE: 190 mg/dL — AB (ref 70–99)

## 2015-11-15 NOTE — Patient Instructions (Signed)
F/u in 3 months with labs Labs today

## 2015-11-15 NOTE — Progress Notes (Signed)
Subjective:    Patient ID: Brandi Calderon, female    DOB: [birth year], 50 y.o.   MRN: [id number]  HPI   Pt presents w/ previous panic attack resulting in ER visit, symptoms of panic attack included vomiting and headache. Cause believed to stress Pt presents today w/ f/u of diabetes. Glucose is currently elevated at 190. Pt reports checks glucose regularly and ranges b/w 150-250 Pt reports headaches possibly due to stress. Reports otc medication doesn't help Pt reports back pain and burning foot pain at night Pt undergoing menopause  Pt reports smoking 4 packs/day    Patient Active Problem List   Diagnosis Date Noted  . Menorrhagia 09/28/2015  . Diabetes ([place]) 05/24/2015  . Back pain 05/24/2015     Medication List       Accurate as of 11/15/15  9:46 AM. Always use your most recent med list.          acetaminophen 500 MG tablet Commonly known as:  TYLENOL Take 2,000 mg by mouth 3 (three) times daily as needed (pain). Reported on 09/19/2015   BC HEADACHE POWDER PO Take 4 packets by mouth daily as needed (pain).   citalopram 20 MG tablet Commonly known as:  CELEXA Take 40 mg by mouth daily.   cyclobenzaprine 10 MG tablet Commonly known as:  FLEXERIL Take 1 tablet (10 mg total) by mouth 2 (two) times daily as needed for muscle spasms.   FISH OIL PO Take 1 capsule by mouth daily. Reported on 09/19/2015   gabapentin 600 MG tablet Commonly known as:  NEURONTIN Take 600 mg by mouth 3 (three) times daily. Take 1 tablets three times per day   glipiZIDE 5 MG tablet Commonly known as:  GLUCOTROL Take 1 tablet (5 mg total) by mouth 2 (two) times daily before a meal.   ibuprofen 800 MG tablet Commonly known as:  ADVIL,MOTRIN Take 1 tablet (800 mg total) by mouth 3 (three) times daily.   insulin detemir 100 UNIT/ML injection Commonly known as:  LEVEMIR Inject 10 Units into the skin at bedtime.   Melatonin 1 MG Tabs Take 1 tablet by mouth at bedtime as needed.    metFORMIN 1000 MG tablet Commonly known as:  GLUCOPHAGE Take 1 tablet (1,000 mg total) by mouth 2 (two) times daily with a meal.   nicotine 10 MG inhaler Commonly known as:  NICOTROL May begin with 12 cartridges inhaled per day, gradually decreasing over time.   oxyCODONE-acetaminophen 5-325 MG tablet Commonly known as:  PERCOCET 1 to 2 tablets every 6 hours as needed for pain.   traZODone 50 MG tablet Commonly known as:  DESYREL Take 50 mg by mouth at bedtime. Reported on 09/19/2015   VICTOZA 18 MG/3ML Sopn Generic drug:  Liraglutide Inject into the skin. Reported on 09/19/2015        Review of Systems     Objective:   Physical Exam  Constitutional: She is oriented to person, place, and time.  Cardiovascular: Normal rate and regular rhythm.   Pulmonary/Chest: Effort normal and breath sounds normal.  Neurological: She is alert and oriented to person, place, and time.    BP 128/75   Pulse (!) 53   Temp 98.7 F (37.1 C)   Wt 162 lb (73.5 kg)   BMI 28.70 kg/m        Assessment & Plan:  Pt has hard home life and is encouraged to reduce stress and quit smoking  F/u in 3 months with labs: Met  C, CBC, A1C Labs today: A1C

## 2015-11-16 ENCOUNTER — Telehealth: Payer: Self-pay

## 2015-11-16 LAB — HEMOGLOBIN A1C
Est. average glucose Bld gHb Est-mCnc: 200 mg/dL
Hgb A1c MFr Bld: 8.6 % — ABNORMAL HIGH (ref 4.8–5.6)

## 2015-11-16 NOTE — Telephone Encounter (Signed)
Brandi Calderon called requesting all her medical records.  I tried to call her back to explain she will need to fill out a medical records release.  I also needed to speak with her about a refill at [facility] that is been sent to Dr. Doreene Adas office at [facility].

## 2015-12-19 ENCOUNTER — Ambulatory Visit: Payer: Self-pay | Admitting: Endocrinology

## 2015-12-19 DIAGNOSIS — E1140 Type 2 diabetes mellitus with diabetic neuropathy, unspecified: Secondary | ICD-10-CM | POA: Insufficient documentation

## 2015-12-19 DIAGNOSIS — Z794 Long term (current) use of insulin: Principal | ICD-10-CM

## 2015-12-19 DIAGNOSIS — E118 Type 2 diabetes mellitus with unspecified complications: Secondary | ICD-10-CM

## 2015-12-19 MED ORDER — INSULIN DETEMIR 100 UNIT/ML ~~LOC~~ SOLN: SUBCUTANEOUS | 4 refills | Status: DC

## 2015-12-19 MED ORDER — GABAPENTIN 600 MG PO TABS: 1200.0000 mg | ORAL_TABLET | Freq: Three times a day (TID) | ORAL | 4 refills | Status: DC

## 2015-12-19 MED ORDER — "INSULIN SYRINGE 31G X 5/16"" 1 ML MISC": 4 refills | Status: DC

## 2015-12-19 NOTE — Patient Instructions (Signed)
Increase the dose of Levemir to 20 units and increase by 1 unit every day as long as the blood sugar level is at 130 or higher before breakfast. Take Levemir first thing in the morning before eating breakfast.  Stop glipizide as it adds nothing to insulin treatment  Increased gabapentin to 1-2 600 mg tablets 3 times a  mg three times a day. 6 tablets a day is the maximum dose

## 2015-12-19 NOTE — Progress Notes (Signed)
Assessment:   There are no diagnoses linked to this encounter.     Plan:    1.  Rx changes: Increase the dose of Levemir to 20 units and increase by 1 unit every day as long as the blood sugar level is at 130 or higher before breakfast. Take levemir first thing in the morning before eating breakfast to enhance adherence.  Stop glipizide as it adds nothing to high dose insulin therapy.    Increased gabapentin 2x300 mg three times a day.   Discussed taking the gabapentin on a regular schedule.   Will give patient a new meter to check with blood sugar meter.   Patient has discontinued the liraglutide (Victoza) because it caused extreme nausea.   2.  Education: Reviewed diabetes management:  Appropriate A1C target, avoid hypoglycemia,  blood pressure (<140/80), and cholesterol (LDL <100). -   Reviewed importance of good glycemic control to reduce risk for complications.   3.   Get regular physical activity at least 30 minutes a day of moderate intensity most days of the week  This discussion took at least 15 minutes out of a total visit time of 25 minutes today.  4. Follow up: I recommend patient follow-up with me at 2 months.   5. Referrals: Primary care physician for back pain and insominia     Subjective:    Brandi Calderon is a 50 y.o. female who is seen in follow up forType 2 diabetes from [id number]  complicated by  neuropathy and obesity    Problem List Back pain for months and has followed up with Dr. Freda Munro. Sharp pain that starts in the lower back and radiates all the way down the lower legs.   Neuropathy in the whole bottom foot. Neuropathic pain wakes them in the middle of the night.   In the middle of the night waking up with Shortness of breath. Sits up and tries to catch breath but has difficulty. Occurred twice in the past week. Coughing without sputum color.   Under extreme stress with son's murder trial ongoing.   Insomnia.  Medication management.       Cardiovascular risk factors: obesity, physical inactivity and tobacco     fasting hyperglycemia and high variability  Reports no symptoms of hypoglycemia.   Denies polyuria and polyphagia Denies chest pain, shortness of breath, edema, foot lesions or ulcers.  Denies severe hypoglycemia or admission to hospital for DKA. Reports that after taking medications feeling very low but has not been able to check blood sugars because lost meter.   Current exercise: none  The patient's history was reviewed and updated as appropriate.  Allergies  Allergen Reactions  . Penicillins Anaphylaxis    Throat swells  . Vicodin [Hydrocodone-Acetaminophen] Hives     Current Outpatient Prescriptions:  .  Aspirin-Salicylamide-Caffeine (BC HEADACHE POWDER PO), Take 4 packets by mouth daily as needed (pain)., Disp: , Rfl:  .  citalopram (CELEXA) 20 MG tablet, Take 40 mg by mouth daily. , Disp: , Rfl:  .  gabapentin (NEURONTIN) 600 MG tablet, Take 600 mg by mouth 3 (three) times daily. Take 1 tablets three times per day, Disp: , Rfl:  .  glipiZIDE (GLUCOTROL) 5 MG tablet, Take 1 tablet (5 mg total) by mouth 2 (two) times daily before a meal., Disp: 180 tablet, Rfl: 3 .  ibuprofen (ADVIL,MOTRIN) 800 MG tablet, Take 1 tablet (800 mg total) by mouth 3 (three) times daily., Disp: 30 tablet, Rfl: 0 .  insulin detemir (  LEVEMIR) 100 UNIT/ML injection, Inject 10 Units into the skin at bedtime., Disp: , Rfl:  .  medroxyPROGESTERone (PROVERA) 10 MG tablet, Take 10 mg by mouth daily., Disp: , Rfl:  .  Melatonin 1 MG TABS, Take 1 tablet by mouth at bedtime as needed., Disp: , Rfl:  .  metFORMIN (GLUCOPHAGE) 1000 MG tablet, Take 1 tablet (1,000 mg total) by mouth 2 (two) times daily with a meal., Disp: 180 tablet, Rfl: 3 .  acetaminophen (TYLENOL) 500 MG tablet, Take 2,000 mg by mouth 3 (three) times daily as needed (pain). Reported on 09/19/2015, Disp: , Rfl:  .  cyclobenzaprine (FLEXERIL) 10 MG tablet, Take 1  tablet (10 mg total) by mouth 2 (two) times daily as needed for muscle spasms. (Patient not taking: Reported on 12/19/2015), Disp: 20 tablet, Rfl: 0 .  Liraglutide (VICTOZA) 18 MG/3ML SOPN, Inject into the skin. Reported on 09/19/2015, Disp: , Rfl:  .  nicotine (NICOTROL) 10 MG inhaler, May begin with 12 cartridges inhaled per day, gradually decreasing over time. (Patient not taking: Reported on 12/19/2015), Disp: 336 each, Rfl: 0 .  Omega-3 Fatty Acids (FISH OIL PO), Take 1 capsule by mouth daily. Reported on 09/19/2015, Disp: , Rfl:  .  oxyCODONE-acetaminophen (PERCOCET) 5-325 MG per tablet, 1 to 2 tablets every 6 hours as needed for pain. (Patient not taking: Reported on 12/19/2015), Disp: 20 tablet, Rfl: 0 .  traZODone (DESYREL) 50 MG tablet, Take 50 mg by mouth at bedtime. Reported on 09/19/2015, Disp: , Rfl:   Social History   Social History  . Marital status: Married    Spouse name: N/A  . Number of children: N/A  . Years of education: N/A   Social History Main Topics  . Smoking status: Current Every Day Smoker    Packs/day: 3.00    Types: Cigarettes  . Smokeless tobacco: Current User  . Alcohol use No  . Drug use: No  . Sexual activity: Not on file   Other Topics Concern  . Not on file   Social History Narrative  . No narrative on file    No family history on file.  Review of Systems A 12 point review of systems was negative except for pertinent items noted in the HPI.   Objective:     Wt Readings from Last 3 Encounters:  11/15/15 162 lb (73.5 kg)  11/11/15 181 lb (82.1 kg)  09/05/15 179 lb (81.2 kg)   There were no vitals taken for this visit.  General appearance:  alert and appears stated age, in distress. Discomfort and possibly agitated.       Eyes:  conjunctivae/corneas clear. EOM's intact. Sclera anicteric. Negative lid lag or proptosis     Neck: no adenopathy, supple, symmetrical, trachea midline  Thyroid:  Mobile, normal size, no palpable nodule  Lung:  clear to auscultation bilaterally  Heart:  regular rate and rhythm, S1, S2 normal, no murmur, click, rub or gallop  Abdomen:  bowel sounds normal; negative bruits  Extremities: extremities normal, atraumatic, no cyanosis or edema     Pulses: DP & PT 2+ and symmetric.  Neuro: normal without focal findings, mental status, speech normal, alert and oriented x3, reflexes normal and symmetric and gait normal.   Feet: negative lesions or ulcers, 10 gram monofilament was positive for loss of sensation up to big toe bilaterally.      Lab Review No components found for: A1C Glucose (mg/dL)  Date Value  11/07/2015 171 (H)  09/28/2015 142 (H)  06/28/2015 199 (H)  12/07/2012 274 (H)  04/20/2011 119 (H)   Glucose, Bld (mg/dL)  Date Value  11/11/2015 195 (H)  02/21/2015 255 (H)  05/09/2010 310 (H)   CO2 (mmol/L)  Date Value  11/11/2015 23  11/07/2015 25  09/28/2015 25   Co2 (mmol/L)  Date Value  12/07/2012 25  04/20/2011 28   BUN (mg/dL)  Date Value  11/11/2015 14  11/07/2015 15  09/28/2015 16  06/28/2015 11  12/07/2012 10  04/20/2011 11   Creatinine (mg/dL)  Date Value  12/07/2012 0.63  04/20/2011 0.53 (L)   Creatinine, Ser (mg/dL)  Date Value  11/11/2015 0.66  11/07/2015 0.63  09/28/2015 0.67   No components found for: LDL,  LDLCALC,  LDLDIRECT Lab Results  Component Value Date   NA 136 11/11/2015   K 3.8 11/11/2015   CL 102 11/11/2015   CO2 23 11/11/2015   BUN 14 11/11/2015   CREATININE 0.66 11/11/2015   GFRAA >60 11/11/2015   GFRNONAA >60 11/11/2015   GLU 288 04/05/2015   CALCIUM 9.3 11/11/2015   ALBUMIN 4.2 11/11/2015     DIABETES MELLITUS RESULTS: Lab Results  Component Value Date   HGBA1C 8.6 (H) 11/15/2015   HGBA1C 8.3 (H) 11/07/2015   HGBA1C 7.3 (H) 09/28/2015   Lab Results  Component Value Date   LDLCALC 130 (H) 11/07/2015   CREATININE 0.66 11/11/2015   Lab Results  Component Value Date   CHOL 189 11/07/2015   Lab Results   Component Value Date   LDLCALC 130 (H) 11/07/2015   No components found for: CHOLLLDLDIRECT No components found for: MICROALB/CR Lab Results  Component Value Date   GFRAA >60 11/11/2015   GFRNONAA >60 11/11/2015

## 2015-12-23 ENCOUNTER — Emergency Department
Admission: EM | Admit: 2015-12-23 | Discharge: 2015-12-23 | Disposition: A | Discharge status: Home / Self Care | Payer: No Typology Code available for payment source | Attending: Emergency Medicine | Admitting: Emergency Medicine

## 2015-12-23 DIAGNOSIS — Z794 Long term (current) use of insulin: Secondary | ICD-10-CM | POA: Diagnosis not present

## 2015-12-23 DIAGNOSIS — M549 Dorsalgia, unspecified: Secondary | ICD-10-CM | POA: Diagnosis present

## 2015-12-23 DIAGNOSIS — Y9389 Activity, other specified: Secondary | ICD-10-CM | POA: Diagnosis not present

## 2015-12-23 DIAGNOSIS — E119 Type 2 diabetes mellitus without complications: Secondary | ICD-10-CM | POA: Diagnosis not present

## 2015-12-23 DIAGNOSIS — I10 Essential (primary) hypertension: Secondary | ICD-10-CM | POA: Insufficient documentation

## 2015-12-23 DIAGNOSIS — M62838 Other muscle spasm: Secondary | ICD-10-CM | POA: Insufficient documentation

## 2015-12-23 DIAGNOSIS — F17210 Nicotine dependence, cigarettes, uncomplicated: Secondary | ICD-10-CM | POA: Diagnosis not present

## 2015-12-23 DIAGNOSIS — Z7984 Long term (current) use of oral hypoglycemic drugs: Secondary | ICD-10-CM | POA: Diagnosis not present

## 2015-12-23 DIAGNOSIS — Z79899 Other long term (current) drug therapy: Secondary | ICD-10-CM | POA: Insufficient documentation

## 2015-12-23 DIAGNOSIS — Y92410 Unspecified street and highway as the place of occurrence of the external cause: Secondary | ICD-10-CM | POA: Insufficient documentation

## 2015-12-23 DIAGNOSIS — Y999 Unspecified external cause status: Secondary | ICD-10-CM | POA: Diagnosis not present

## 2015-12-23 DIAGNOSIS — M7918 Myalgia, other site: Secondary | ICD-10-CM

## 2015-12-23 MED ORDER — NAPROXEN 500 MG PO TABS: 500.0000 mg | ORAL_TABLET | Freq: Two times a day (BID) | ORAL | 0 refills | Status: DC

## 2015-12-23 MED ORDER — BACLOFEN 10 MG PO TABS: 10.0000 mg | ORAL_TABLET | Freq: Three times a day (TID) | ORAL | 0 refills | Status: DC | PRN

## 2015-12-23 MED ORDER — KETOROLAC TROMETHAMINE 30 MG/ML IJ SOLN
30.0000 mg | Freq: Once | INTRAMUSCULAR | Status: AC
  Administered 2015-12-23: 30 mg via INTRAMUSCULAR
  Filled 2015-12-23: qty 1

## 2015-12-23 NOTE — ED Notes (Signed)
Pt verbalized understanding of discharge instructions. NAD at this time. 
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
## 2015-12-27 ENCOUNTER — Ambulatory Visit: Payer: Self-pay | Admitting: Internal Medicine

## 2015-12-27 ENCOUNTER — Encounter: Payer: Self-pay | Admitting: Internal Medicine

## 2015-12-27 VITALS — BP 119/75 | HR 68 | Temp 98.0°F | Wt 175.0 lb

## 2015-12-27 DIAGNOSIS — E119 Type 2 diabetes mellitus without complications: Secondary | ICD-10-CM

## 2015-12-27 DIAGNOSIS — Z794 Long term (current) use of insulin: Principal | ICD-10-CM

## 2015-12-27 LAB — GLUCOSE, POCT (MANUAL RESULT ENTRY): POC Glucose: 205 mg/dl — AB (ref 70–99)

## 2015-12-27 NOTE — Patient Instructions (Signed)
Keep f/u apt. In 6 weeks w/ pre-apt labs
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
## 2015-12-29 ENCOUNTER — Ambulatory Visit
Admission: RE | Admit: 2015-12-29 | Discharge: 2015-12-29 | Disposition: A | Discharge status: Home / Self Care | Payer: Disability Insurance | Source: Ambulatory Visit | Attending: Family Medicine | Admitting: Family Medicine

## 2015-12-29 ENCOUNTER — Other Ambulatory Visit: Payer: Self-pay | Admitting: Family Medicine

## 2015-12-29 DIAGNOSIS — M549 Dorsalgia, unspecified: Secondary | ICD-10-CM | POA: Diagnosis present

## 2015-12-29 DIAGNOSIS — M79673 Pain in unspecified foot: Secondary | ICD-10-CM | POA: Diagnosis present

## 2015-12-29 DIAGNOSIS — G5603 Carpal tunnel syndrome, bilateral upper limbs: Secondary | ICD-10-CM | POA: Diagnosis not present

## 2015-12-29 DIAGNOSIS — R52 Pain, unspecified: Secondary | ICD-10-CM

## 2016-02-07 ENCOUNTER — Other Ambulatory Visit: Payer: Self-pay

## 2016-02-07 DIAGNOSIS — E119 Type 2 diabetes mellitus without complications: Secondary | ICD-10-CM

## 2016-02-07 DIAGNOSIS — Z23 Encounter for immunization: Secondary | ICD-10-CM

## 2016-02-07 DIAGNOSIS — Z794 Long term (current) use of insulin: Secondary | ICD-10-CM

## 2016-02-08 LAB — CBC WITH DIFFERENTIAL
BASOS ABS: 0.1 10*3/uL (ref 0.0–0.2)
BASOS: 0 %
EOS (ABSOLUTE): 0.2 10*3/uL (ref 0.0–0.4)
Eos: 2 %
HEMOGLOBIN: 14 g/dL (ref 11.1–15.9)
Hematocrit: 42.6 % (ref 34.0–46.6)
IMMATURE GRANS (ABS): 0 10*3/uL (ref 0.0–0.1)
Immature Granulocytes: 0 %
LYMPHS: 24 %
Lymphocytes Absolute: 2.7 10*3/uL (ref 0.7–3.1)
MCH: 28.3 pg (ref 26.6–33.0)
MCHC: 32.9 g/dL (ref 31.5–35.7)
MCV: 86 fL (ref 79–97)
MONOCYTES: 6 %
Monocytes Absolute: 0.6 10*3/uL (ref 0.1–0.9)
NEUTROS ABS: 7.8 10*3/uL — AB (ref 1.4–7.0)
Neutrophils: 68 %
RBC: 4.95 x10E6/uL (ref 3.77–5.28)
RDW: 15.6 % — ABNORMAL HIGH (ref 12.3–15.4)
WBC: 11.4 10*3/uL — ABNORMAL HIGH (ref 3.4–10.8)

## 2016-02-08 LAB — COMPREHENSIVE METABOLIC PANEL
ALBUMIN: 4.5 g/dL (ref 3.5–5.5)
ALT: 15 IU/L (ref 0–32)
AST: 10 IU/L (ref 0–40)
Albumin/Globulin Ratio: 1.8 (ref 1.2–2.2)
Alkaline Phosphatase: 122 IU/L — ABNORMAL HIGH (ref 39–117)
BUN / CREAT RATIO: 16 (ref 9–23)
BUN: 10 mg/dL (ref 6–24)
CALCIUM: 9.5 mg/dL (ref 8.7–10.2)
CO2: 27 mmol/L (ref 18–29)
CREATININE: 0.63 mg/dL (ref 0.57–1.00)
Chloride: 98 mmol/L (ref 96–106)
GFR, EST AFRICAN AMERICAN: 123 mL/min/{1.73_m2} (ref >59)
GFR, EST NON AFRICAN AMERICAN: 106 mL/min/{1.73_m2} (ref >59)
GLUCOSE: 266 mg/dL — AB (ref 65–99)
Globulin, Total: 2.5 g/dL (ref 1.5–4.5)
Potassium: 5.1 mmol/L (ref 3.5–5.2)
Sodium: 142 mmol/L (ref 134–144)
TOTAL PROTEIN: 7 g/dL (ref 6.0–8.5)

## 2016-02-08 LAB — HEMOGLOBIN A1C
Est. average glucose Bld gHb Est-mCnc: 258 mg/dL
Hgb A1c MFr Bld: 10.6 % — ABNORMAL HIGH (ref 4.8–5.6)

## 2016-02-13 ENCOUNTER — Ambulatory Visit: Payer: Self-pay | Admitting: Family Medicine

## 2016-02-13 VITALS — BP 118/68 | HR 66 | Temp 98.3°F | Wt 176.0 lb

## 2016-02-13 DIAGNOSIS — E119 Type 2 diabetes mellitus without complications: Secondary | ICD-10-CM

## 2016-02-13 DIAGNOSIS — E1140 Type 2 diabetes mellitus with diabetic neuropathy, unspecified: Secondary | ICD-10-CM

## 2016-02-13 DIAGNOSIS — Z794 Long term (current) use of insulin: Principal | ICD-10-CM

## 2016-02-13 LAB — GLUCOSE, POCT (MANUAL RESULT ENTRY): POC GLUCOSE: 250 mg/dL — AB (ref 70–99)

## 2016-02-13 MED ORDER — IBUPROFEN 800 MG PO TABS: 800.0000 mg | ORAL_TABLET | Freq: Three times a day (TID) | ORAL | 0 refills | Status: DC | PRN

## 2016-02-13 MED ORDER — DAPAGLIFLOZIN PROPANEDIOL 5 MG PO TABS: 5.0000 mg | ORAL_TABLET | Freq: Every day | ORAL | 0 refills | Status: DC

## 2016-02-13 NOTE — Progress Notes (Signed)
Primary Care Progress Note  Patient: Brandi Calderon Female    DOB: 1965/11/16   50 y.o.   MRN: [id number] Visit Date: 02/13/2016  Today's Provider: Lelon Huh, MD  Chief Complaint  Patient presents with  . Medication Refill  . Diabetes   Subjective:    HPI Follow up for uncontrolled diabetes.  Lab Results  Component Value Date   HGBA1C 10.6 (H) 02/07/2016   BMET    Component Value Date/Time   NA 142 02/07/2016 1004   NA 134 (L) 12/07/2012 1809   K 5.1 02/07/2016 1004   K 3.4 (L) 12/07/2012 1809   CL 98 02/07/2016 1004   CL 103 12/07/2012 1809   CO2 27 02/07/2016 1004   CO2 25 12/07/2012 1809   GLUCOSE 266 (H) 02/07/2016 1004   GLUCOSE 195 (H) 11/11/2015 1855   GLUCOSE 274 (H) 12/07/2012 1809   BUN 10 02/07/2016 1004   BUN 10 12/07/2012 1809   CREATININE 0.63 02/07/2016 1004   CREATININE 0.63 12/07/2012 1809   CALCIUM 9.5 02/07/2016 1004   CALCIUM 8.6 12/07/2012 1809   GFRNONAA 106 02/07/2016 1004   GFRNONAA >60 12/07/2012 1809   GFRAA 123 02/07/2016 1004   GFRAA >60 12/07/2012 1809    Was prescribed Victoza but only took for a few days due to nausea Is taking Levemir 30units a day. Still taking metformin.   Complains that neuropathic pain is worsening. Only taking one 600mg  gabapentin TID. Is out of ibuprofen.     Allergies  Allergen Reactions  . Penicillins Anaphylaxis    Throat swells  . Vicodin [Hydrocodone-Acetaminophen] Hives   Previous Medications   ACETAMINOPHEN (TYLENOL) 500 MG TABLET    Take 2,000 mg by mouth 3 (three) times daily as needed (pain). Reported on [id number]   ASPIRIN-SALICYLAMIDE-CAFFEINE (BC HEADACHE POWDER PO)    Take 4 packets by mouth daily as needed (pain).   BACLOFEN (LIORESAL) 10 MG TABLET    Take 1 tablet (10 mg total) by mouth 3 (three) times daily as needed for muscle spasms.   CITALOPRAM (CELEXA) 20 MG TABLET    Take 40 mg by mouth daily.    CLONAZEPAM (KLONOPIN) 0.5 MG TABLET    Take 0.5 mg by mouth 2 (two)  times daily as needed for anxiety.   CYCLOBENZAPRINE (FLEXERIL) 10 MG TABLET    Take 1 tablet (10 mg total) by mouth 2 (two) times daily as needed for muscle spasms.   GABAPENTIN (NEURONTIN) 600 MG TABLET    Take 2 tablets (1,200 mg total) by mouth 3 (three) times daily. Take 1 tablets three times per day   IBUPROFEN (ADVIL,MOTRIN) 800 MG TABLET    Take 1 tablet (800 mg total) by mouth 3 (three) times daily.   INSULIN DETEMIR (LEVEMIR) 100 UNIT/ML INJECTION    Start with 20 units. Increase by 1 unit a day to a maximum of 100 units in the morning as long as morning sugar is over 100.   INSULIN SYRINGE-NEEDLE U-100 (INSULIN SYRINGE 1CC/31GX5/16") 31G X 5/16" 1 ML MISC    Every AM for Levemir   LIRAGLUTIDE (VICTOZA) 18 MG/3ML SOPN    Patient not taking   MEDROXYPROGESTERONE (PROVERA) 10 MG TABLET    Take 10 mg by mouth daily.   MELATONIN 1 MG TABS    Take 1 tablet by mouth at bedtime as needed.   METFORMIN (GLUCOPHAGE) 1000 MG TABLET    Take 1 tablet (1,000 mg total) by mouth 2 (two) times  daily with a meal.   NAPROXEN (NAPROSYN) 500 MG TABLET    Take 1 tablet (500 mg total) by mouth 2 (two) times daily with a meal.   NICOTINE (NICOTROL) 10 MG INHALER    May begin with 12 cartridges inhaled per day, gradually decreasing over time.   OMEGA-3 FATTY ACIDS (FISH OIL PO)    Take 1 capsule by mouth daily. Reported on 09/19/2015   OXYCODONE-ACETAMINOPHEN (PERCOCET) 5-325 MG PER TABLET    1 to 2 tablets every 6 hours as needed for pain.   TRAZODONE (DESYREL) 50 MG TABLET    Take 100 mg by mouth at bedtime. Reported on 09/19/2015    Review of Systems  Social History  Substance Use Topics  . Smoking status: Current Every Day Smoker    Packs/day: 4.50    Years: 33.00    Types: Cigarettes  . Smokeless tobacco: Current User  . Alcohol use No   Objective:   BP 118/68   Pulse 66   Temp 98.3 F (36.8 C)   Wt 176 lb (79.8 kg)   LMP  (Within Months)   SpO2 95%   BMI 33.25 kg/m   Physical  Exam   General Appearance:    Alert, cooperative, no distress  Eyes:    PERRL, conjunctiva/corneas clear, EOM's intact       Lungs:     Clear to auscultation bilaterally, respirations unlabored  Heart:    Regular rate and rhythm  Neurologic:   Awake, alert, oriented x 3. No apparent focal neurological           defect.       Results for orders placed or performed in visit on 02/13/16  POCT Glucose (CBG)  Result Value Ref Range   POC Glucose 250 (A) 70 - 99 mg/dl       Assessment & Plan:     1. Type 2 diabetes mellitus without complication, with long-term current use of insulin (HCC) Intolerant of Victoza. Try Brandi Calderon. Return 2 months and check labs one week before o.v.  - POCT Glucose (CBG) - dapagliflozin propanediol (FARXIGA) 5 MG TABS tablet; Take 5 mg by mouth daily.  Dispense: 90 tablet; Refill: 0 - Renal function panel; Future - Hemoglobin A1c; Future  2. Neuropathy due to type 2 diabetes mellitus (Brandi Calderon) - severe Advised may take up to 2 x 600mg  gabapentin every eight hours and start back on ibuprofen.  - ibuprofen (ADVIL,MOTRIN) 800 MG tablet; Take 1 tablet (800 mg total) by mouth every 8 (eight) hours as needed.  Dispense: 100 tablet; Refill: 0       Lelon Huh, MD

## 2016-02-14 ENCOUNTER — Ambulatory Visit: Payer: Self-pay | Admitting: Internal Medicine

## 2016-02-20 ENCOUNTER — Ambulatory Visit: Payer: Self-pay

## 2016-02-27 ENCOUNTER — Telehealth: Payer: Self-pay | Admitting: Urology

## 2016-02-27 NOTE — Telephone Encounter (Signed)
Wants to reschedule missed apt on 11/21. Call back.

## 2016-03-10 ENCOUNTER — Other Ambulatory Visit: Payer: Self-pay | Admitting: Internal Medicine

## 2016-03-11 ENCOUNTER — Encounter: Payer: Self-pay | Admitting: Internal Medicine

## 2016-03-11 ENCOUNTER — Ambulatory Visit (INDEPENDENT_AMBULATORY_CARE_PROVIDER_SITE_OTHER): Payer: Self-pay | Admitting: Internal Medicine

## 2016-03-11 VITALS — BP 138/82 | HR 68 | Temp 98.1°F | Ht 61.0 in | Wt 175.0 lb

## 2016-03-11 DIAGNOSIS — F321 Major depressive disorder, single episode, moderate: Secondary | ICD-10-CM

## 2016-03-11 DIAGNOSIS — E119 Type 2 diabetes mellitus without complications: Secondary | ICD-10-CM

## 2016-03-11 DIAGNOSIS — F331 Major depressive disorder, recurrent, moderate: Secondary | ICD-10-CM | POA: Insufficient documentation

## 2016-03-11 DIAGNOSIS — N938 Other specified abnormal uterine and vaginal bleeding: Secondary | ICD-10-CM

## 2016-03-11 DIAGNOSIS — E1140 Type 2 diabetes mellitus with diabetic neuropathy, unspecified: Secondary | ICD-10-CM

## 2016-03-11 DIAGNOSIS — Z794 Long term (current) use of insulin: Secondary | ICD-10-CM

## 2016-03-11 NOTE — Progress Notes (Signed)
Date:  03/11/2016   Name:  Brandi Calderon   DOB:  1965-04-29   MRN:  [id number]  Pateint being seen at [facility] and still using Medication Management at [facility].  She sees GYN Dr. Kenton Kingfisher and Psych Dr. Leonides Schanz at [facility].  Chief Complaint: Establish Care and Diabetes Diabetes  She presents for her follow-up diabetic visit. She has type 2 diabetes mellitus. Her disease course has been fluctuating. Hypoglycemia symptoms include nervousness/anxiousness. Pertinent negatives for hypoglycemia include no dizziness or headaches. Associated symptoms include chest pain (over distal sternum) and foot paresthesias. Pertinent negatives for diabetes include no fatigue, no foot ulcerations, no polydipsia, no polyuria and no visual change. Current diabetic treatment includes insulin injections and oral agent (monotherapy) (on Levemir 32 units). Her weight is stable. She monitors blood glucose at home 1-2 x per day. Her breakfast blood glucose is taken between 7-8 am. Her breakfast blood glucose range is generally >200 mg/dl. An ACE inhibitor/angiotensin II receptor blocker is not being taken. Eye exam is current.  Depression         This is a chronic problem.  Associated symptoms include no fatigue and no headaches.     The symptoms are aggravated by family issues (death of her son).  Past treatments include SSRIs - Selective serotonin reuptake inhibitors and TCAs - Tricyclic antidepressants. Chest wall pain - gets a swelling over her sternum from time to time.  It gets the size of a walnut then will gradually recede.  No drainage or fever.   No hx if injury.   Social - she has applied for SSD which is pending. Disability based on depression, neuropathy and pain. Dysfunctional bleeding - seeing GYN and on Provera.    Review of Systems  Constitutional: Negative for chills, fatigue and fever.  HENT: Negative for congestion and sinus pressure.   Respiratory: Negative for cough, chest tightness, shortness of breath and  wheezing.   Cardiovascular: Positive for chest pain (over distal sternum).  Gastrointestinal: Negative for abdominal pain.  Endocrine: Negative for polydipsia and polyuria.  Genitourinary: Positive for vaginal bleeding.  Musculoskeletal: Positive for arthralgias and back pain.  Skin: Negative for rash.  Allergic/Immunologic: Negative for environmental allergies.  Neurological: Positive for numbness. Negative for dizziness and headaches.  Psychiatric/Behavioral: Positive for depression, dysphoric mood and sleep disturbance. The patient is nervous/anxious.     Patient Active Problem List   Diagnosis Date Noted  . Neuropathy due to type 2 diabetes mellitus ([place]) - severe 12/19/2015  . Menorrhagia 09/28/2015  . Type 2 diabetes mellitus treated with insulin ([place]) 05/24/2015  . Back pain 05/24/2015    Prior to Admission medications   Medication Sig Start Date End Date Taking? Authorizing Provider  acetaminophen (TYLENOL) 500 MG tablet Take 2,000 mg by mouth 3 (three) times daily as needed (pain). Reported on 09/19/2015   Yes Historical Provider, MD  Aspirin-Salicylamide-Caffeine (BC HEADACHE POWDER PO) Take 4 packets by mouth daily as needed (pain).   Yes Historical Provider, MD  baclofen (LIORESAL) 10 MG tablet Take 1 tablet (10 mg total) by mouth 3 (three) times daily as needed for muscle spasms. 12/23/15  Yes Jami L Hagler, PA-C  citalopram (CELEXA) 20 MG tablet Take 40 mg by mouth daily.    Yes Historical Provider, MD  clonazePAM (KLONOPIN) 0.5 MG tablet Take 0.5 mg by mouth 2 (two) times daily as needed for anxiety.   Yes Historical Provider, MD  cyclobenzaprine (FLEXERIL) 10 MG tablet Take 1 tablet (10 mg  total) by mouth 2 (two) times daily as needed for muscle spasms. 04/21/14  Yes Britt Bottom, NP  gabapentin (NEURONTIN) 600 MG tablet Take 2 tablets (1,200 mg total) by mouth 3 (three) times daily. Take 1 tablets three times per day 12/19/15  Yes Barnet Pall, MD  ibuprofen  (ADVIL,MOTRIN) 800 MG tablet Take 1 tablet (800 mg total) by mouth every 8 (eight) hours as needed. 02/13/16  Yes Birdie Sons, MD  insulin detemir (LEVEMIR) 100 UNIT/ML injection Start with 20 units. Increase by 1 unit a day to a maximum of 100 units in the morning as long as morning sugar is over 100. 12/19/15  Yes Barnet Pall, MD  Insulin Syringe-Needle U-100 (INSULIN SYRINGE 1CC/31GX5/16") 31G X 5/16" 1 ML MISC Every AM for Levemir 12/19/15  Yes Barnet Pall, MD  medroxyPROGESTERone (PROVERA) 10 MG tablet Take 10 mg by mouth daily.   Yes Historical Provider, MD  metFORMIN (GLUCOPHAGE) 1000 MG tablet Take 1 tablet (1,000 mg total) by mouth 2 (two) times daily with a meal. 09/19/15  Yes Ewell Poe, MD  Omega-3 Fatty Acids (FISH OIL PO) Take 1 capsule by mouth daily. Reported on 09/19/2015   Yes Historical Provider, MD  oxyCODONE-acetaminophen (PERCOCET) 5-325 MG per tablet 1 to 2 tablets every 6 hours as needed for pain. 11/27/13  Yes Harden Mo, MD  traZODone (DESYREL) 50 MG tablet Take 100 mg by mouth at bedtime. Reported on 09/19/2015   Yes Historical Provider, MD  dapagliflozin propanediol (FARXIGA) 5 MG TABS tablet Take 5 mg by mouth daily. Patient not taking: Reported on 03/11/2016 02/13/16   Birdie Sons, MD  Melatonin 1 MG TABS Take 1 tablet by mouth at bedtime as needed.    Historical Provider, MD  nicotine (NICOTROL) 10 MG inhaler May begin with 12 cartridges inhaled per day, gradually decreasing over time. Patient not taking: Reported on 03/11/2016 09/05/15   Boyce Medici, FNP    Allergies  Allergen Reactions  . Penicillins Anaphylaxis    Throat swells  . Vicodin [Hydrocodone-Acetaminophen] Hives  . Victoza [Liraglutide]     Severe nausea    Past Surgical History:  Procedure Laterality Date  . CESAREAN SECTION    . CESAREAN SECTION    . OTHER SURGICAL HISTORY     3 c-sections    Social History  Substance Use Topics  . Smoking status: Current Every Day Smoker     Packs/day: 4.50    Years: 33.00    Types: Cigarettes  . Smokeless tobacco: Current User  . Alcohol use No     Medication list has been reviewed and updated.   Physical Exam  Constitutional: She is oriented to person, place, and time. She appears well-developed. No distress.  HENT:  Head: Normocephalic and atraumatic.  Neck: Carotid bruit is not present.  Cardiovascular: Normal rate, regular rhythm, normal heart sounds and normal pulses.   Pulmonary/Chest: Effort normal. No respiratory distress. She has decreased breath sounds. She has no wheezes. She has no rhonchi.    Musculoskeletal: Normal range of motion.       Lumbar back: She exhibits tenderness. She exhibits no spasm.  Neurological: She is alert and oriented to person, place, and time. She displays no tremor. Gait abnormal.  Reflex Scores:      Patellar reflexes are 1+ on the right side and 1+ on the left side. Skin: Skin is warm and dry. No rash noted.  Psychiatric: She has a normal mood  and affect. Her behavior is normal. Thought content normal.  Nursing note and vitals reviewed.   BP 138/82   Pulse 68   Temp 98.1 F (36.7 C)   Ht 5\' 1"  (1.549 m)   Wt 175 lb (79.4 kg)   LMP  (Within Months)   SpO2 97%   BMI 33.07 kg/m   Assessment and Plan: 1. Type 2 diabetes mellitus treated with insulin (HCC) Continue Levemir 32 units in the AM and add Levemir 5 units at PM increasing by 2 units daily until glucoses <150  2. Neuropathy due to type 2 diabetes mellitus (HCC) - severe Continue gabapentin Continue disability application  3. Dysfunctional uterine bleeding Continue Provera and GYN follow up  4. Depression, major, single episode, moderate ([place]) Followed by Psych   Halina Maidens, MD [facility]  03/11/2016

## 2016-03-11 NOTE — Patient Instructions (Signed)
Levemir insulin 32 units in the AM  Add Levemir 5 units in the PM - increase by 2 units per day until fasting blood sugar is <150

## 2016-04-09 ENCOUNTER — Other Ambulatory Visit: Payer: Self-pay

## 2016-04-16 ENCOUNTER — Ambulatory Visit: Payer: Self-pay

## 2016-04-18 ENCOUNTER — Ambulatory Visit: Payer: Self-pay

## 2016-04-23 ENCOUNTER — Ambulatory Visit: Payer: Self-pay

## 2016-04-30 ENCOUNTER — Ambulatory Visit: Payer: Self-pay | Admitting: Adult Health Nurse Practitioner

## 2016-04-30 ENCOUNTER — Other Ambulatory Visit: Payer: Self-pay

## 2016-04-30 VITALS — BP 128/83 | HR 66 | Wt 175.2 lb

## 2016-04-30 DIAGNOSIS — M79671 Pain in right foot: Secondary | ICD-10-CM

## 2016-04-30 DIAGNOSIS — Z794 Long term (current) use of insulin: Principal | ICD-10-CM

## 2016-04-30 DIAGNOSIS — E119 Type 2 diabetes mellitus without complications: Secondary | ICD-10-CM

## 2016-04-30 LAB — GLUCOSE, POCT (MANUAL RESULT ENTRY): POC GLUCOSE: 198 mg/dL — AB (ref 70–99)

## 2016-04-30 NOTE — Addendum Note (Signed)
Addended by: Viviano Simas on: 04/30/2016 07:19 PM   Modules accepted: Orders

## 2016-04-30 NOTE — Progress Notes (Signed)
Patient: Brandi Calderon Female    DOB: 1965-05-09   51 y.o.   MRN: [id number] Visit Date: 04/30/2016  Today's Provider: Staci Acosta, NP   No chief complaint on file.  Subjective:    HPI     Here for R foot with knot. Has been on her foot x 2 weeks.  Knot hurts 10/10 and burning in her feet.  She states they are on fire sometimes.  On gabapentin 1200mg  TID.  Not taking any medications for it.   Allergies  Allergen Reactions  . Penicillins Anaphylaxis    Throat swells  . Vicodin [Hydrocodone-Acetaminophen] Hives  . Victoza [Liraglutide]     Severe nausea   Previous Medications   ACETAMINOPHEN (TYLENOL) 500 MG TABLET    Take 2,000 mg by mouth 3 (three) times daily as needed (pain). Reported on [id number]   ASPIRIN-SALICYLAMIDE-CAFFEINE (BC HEADACHE POWDER PO)    Take 4 packets by mouth daily as needed (pain).   BACLOFEN (LIORESAL) 10 MG TABLET    Take 1 tablet (10 mg total) by mouth 3 (three) times daily as needed for muscle spasms.   CITALOPRAM (CELEXA) 20 MG TABLET    Take 40 mg by mouth daily.    CLONAZEPAM (KLONOPIN) 0.5 MG TABLET    Take 0.5 mg by mouth 2 (two) times daily as needed for anxiety.   CYCLOBENZAPRINE (FLEXERIL) 10 MG TABLET    Take 1 tablet (10 mg total) by mouth 2 (two) times daily as needed for muscle spasms.   GABAPENTIN (NEURONTIN) 600 MG TABLET    Take 2 tablets (1,200 mg total) by mouth 3 (three) times daily. Take 1 tablets three times per day   IBUPROFEN (ADVIL,MOTRIN) 800 MG TABLET    Take 1 tablet (800 mg total) by mouth every 8 (eight) hours as needed.   INSULIN DETEMIR (LEVEMIR) 100 UNIT/ML INJECTION    Start with 20 units. Increase by 1 unit a day to a maximum of 100 units in the morning as long as morning sugar is over 100.   INSULIN SYRINGE-NEEDLE U-100 (INSULIN SYRINGE 1CC/31GX5/16") 31G X 5/16" 1 ML MISC    Every AM for Levemir   MEDROXYPROGESTERONE (PROVERA) 10 MG TABLET    Take 10 mg by mouth daily.   MELATONIN 1 MG TABS    Take 1 tablet  by mouth at bedtime as needed.   METFORMIN (GLUCOPHAGE) 1000 MG TABLET    Take 1 tablet (1,000 mg total) by mouth 2 (two) times daily with a meal.   OMEGA-3 FATTY ACIDS (FISH OIL PO)    Take 1 capsule by mouth daily. Reported on 09/19/2015   OXYCODONE-ACETAMINOPHEN (PERCOCET) 5-325 MG PER TABLET    1 to 2 tablets every 6 hours as needed for pain.   TRAZODONE (DESYREL) 50 MG TABLET    Take 100 mg by mouth at bedtime. Reported on 09/19/2015    Review of Systems  All other systems reviewed and are negative.   Social History  Substance Use Topics  . Smoking status: Current Every Day Smoker    Packs/day: 4.50    Years: 33.00    Types: Cigarettes  . Smokeless tobacco: Current User  . Alcohol use No   Objective:   BP 128/83   Pulse 66   Wt 175 lb 3.2 oz (79.5 kg)   LMP 07/20/2015 (Exact Date)   BMI 33.10 kg/m   Physical Exam  Constitutional: She is oriented to person, place, and time. She appears well-developed and well-nourished.  Cardiovascular: Normal rate, regular rhythm and normal heart sounds.   Pulmonary/Chest: Effort normal and breath sounds normal.  Neurological: She is alert and oriented to person, place, and time.  Skin:  Marble sized knot on the dorsal surface of the R foot on the ball. Tender to touch. No drainage or redness.   Vitals reviewed.       Assessment & Plan:           Refer to podiatry for eval and management.  FU for regularly scheduled appt.  Labs done tonight.    Staci Acosta, NP   [facility]

## 2016-05-01 ENCOUNTER — Other Ambulatory Visit: Payer: Self-pay

## 2016-05-01 DIAGNOSIS — E119 Type 2 diabetes mellitus without complications: Secondary | ICD-10-CM

## 2016-05-01 LAB — PLEASE NOTE

## 2016-05-01 LAB — HEMOGLOBIN A1C
Est. average glucose Bld gHb Est-mCnc: 246 mg/dL
HEMOGLOBIN A1C: 10.2 % — AB (ref 4.8–5.6)

## 2016-05-01 NOTE — Progress Notes (Unsigned)
Lab was not drawn last night.

## 2016-05-02 LAB — RENAL FUNCTION PANEL
Albumin: 4.4 g/dL (ref 3.5–5.5)
BUN / CREAT RATIO: 16 (ref 9–23)
BUN: 11 mg/dL (ref 6–24)
CALCIUM: 9.6 mg/dL (ref 8.7–10.2)
CHLORIDE: 94 mmol/L — AB (ref 96–106)
CO2: 25 mmol/L (ref 18–29)
Creatinine, Ser: 0.7 mg/dL (ref 0.57–1.00)
GFR calc Af Amer: 118 mL/min/{1.73_m2} (ref >59)
GFR calc non Af Amer: 103 mL/min/{1.73_m2} (ref >59)
GLUCOSE: 282 mg/dL — AB (ref 65–99)
PHOSPHORUS: 4.2 mg/dL (ref 2.5–4.5)
POTASSIUM: 4.6 mmol/L (ref 3.5–5.2)
SODIUM: 138 mmol/L (ref 134–144)

## 2016-05-17 ENCOUNTER — Ambulatory Visit: Payer: Self-pay

## 2016-05-17 ENCOUNTER — Ambulatory Visit: Payer: No Typology Code available for payment source | Admitting: Podiatry

## 2016-05-17 DIAGNOSIS — M79671 Pain in right foot: Secondary | ICD-10-CM

## 2016-05-17 DIAGNOSIS — Q828 Other specified congenital malformations of skin: Secondary | ICD-10-CM

## 2016-05-17 DIAGNOSIS — R52 Pain, unspecified: Secondary | ICD-10-CM

## 2016-05-17 DIAGNOSIS — L84 Corns and callosities: Secondary | ICD-10-CM

## 2016-05-17 DIAGNOSIS — M79672 Pain in left foot: Secondary | ICD-10-CM

## 2016-05-17 NOTE — Progress Notes (Signed)
   Subjective: Patient presents to the office today for chief complaint of painful callus lesions of the feet. Patient states that the pain is ongoing and is affecting their ability to ambulate without pain. Patient presents today for further treatment and evaluation.  Objective:  Physical Exam General: Alert and oriented x3 in no acute distress  Dermatology: Hyperkeratotic lesion present on the plantar aspect of the right foot. Pain on palpation with a central nucleated core noted.  Skin is warm, dry and supple bilateral lower extremities. Negative for open lesions or macerations.  Vascular: Palpable pedal pulses bilaterally. No edema or erythema noted. Capillary refill within normal limits.  Neurological: Epicritic and protective threshold grossly intact bilaterally.   Musculoskeletal Exam: Pain on palpation at the keratotic lesion noted. Range of motion within normal limits bilateral. Muscle strength 5/5 in all groups bilateral.  Assessment: #1 porokeratosis plantar aspect of the right foot   Plan of Care:  #1 Patient evaluated #2 Excisional debridement of  keratoic lesion using a chisel blade was performed without incident.  #3 Treated area(s) with Salinocaine and dressed with light dressing. #4 Patient is to return to the clinic PRN.   Edrick Kins, DPM [facility]  Dr. Edrick Kins, [address]                                        [place], [place] [address]                Office [phone]  Fax [phone]

## 2016-06-03 ENCOUNTER — Telehealth: Payer: Self-pay | Admitting: Pharmacist

## 2016-06-03 NOTE — Telephone Encounter (Signed)
Called [place] for refill on Farxiga 5, allow 7-10 days to receive.

## 2016-06-06 ENCOUNTER — Telehealth: Payer: Self-pay

## 2016-06-06 NOTE — Telephone Encounter (Signed)
Received PAP application from [facility] for Levemir placed for provider to sign.

## 2016-06-11 ENCOUNTER — Ambulatory Visit: Payer: No Typology Code available for payment source | Admitting: Obstetrics & Gynecology

## 2016-06-18 ENCOUNTER — Ambulatory Visit: Payer: Self-pay | Admitting: Physician Assistant

## 2016-06-18 VITALS — BP 137/67 | Wt 172.8 lb

## 2016-06-18 DIAGNOSIS — E119 Type 2 diabetes mellitus without complications: Secondary | ICD-10-CM

## 2016-06-18 DIAGNOSIS — E1140 Type 2 diabetes mellitus with diabetic neuropathy, unspecified: Secondary | ICD-10-CM

## 2016-06-18 DIAGNOSIS — Z794 Long term (current) use of insulin: Principal | ICD-10-CM

## 2016-06-18 MED ORDER — INSULIN DETEMIR 100 UNIT/ML ~~LOC~~ SOLN: 35.0000 [IU] | Freq: Two times a day (BID) | SUBCUTANEOUS | 4 refills | Status: DC

## 2016-06-18 MED ORDER — SIMVASTATIN 20 MG PO TABS: 20.0000 mg | ORAL_TABLET | Freq: Every day | ORAL | 3 refills | Status: DC

## 2016-06-18 NOTE — Progress Notes (Signed)
Assessment:   Brandi Calderon's HbA1c today is 9.9%, it was 10.2% in January 2018. Blood glucose targets were reviewed and dietary changes were discussed. We will increase Levemir to 35 units from 30 units BID and Farxiga dose from 5 mg to 10 mg.     Plan:    1.  Rx changes: Increase Farxiga to 10 mg from 5 mg. Increase Levemir to 35 units in the morning and 35 units at night.   Start simvastatin 20 mg once daily for high cholesterol 2. Try to cut back on soda intake. Use stevia in your sweet tea instead of sugar.  3. Follow up: I recommend patient follow-up with me at 3 months.     Subjective:    Brandi Calderon is a 51 y.o. female who is seen in follow up forType 2 diabetes from January 2018.    Eye exam current (within one year):   Current regimen:  She takes 32 units of Levemir in the morning and 32 units at night and Metformin, as well as [place] since January. She reports no side effects from the 5 mg of Farxiga. She did not tolerate Victoza.   Patient's glucometer was not downloaded. She did not bring her meter today.   Brandi Calderon is performing SMBG on average <1 times per day. She checks her blood sugar in the morning when she remembers. She reports a range of 100-350.   Reports no symptoms of hypoglycemia.  Denies chest pain, shortness of breath, edema. She recently had a wart taken off her feet.   Denies severe hypoglycemia or admission to hospital for DKA.  Current exercise: minimal.   She struggles with drinking soda and sweet tea.   The patient's history was reviewed and updated as appropriate.  Allergies  Allergen Reactions  . Penicillins Anaphylaxis    Throat swells  . Vicodin [Hydrocodone-Acetaminophen] Hives  . Victoza [Liraglutide]     Severe nausea     Current Outpatient Prescriptions:  .  acetaminophen (TYLENOL) 500 MG tablet, Take 2,000 mg by mouth 3 (three) times daily as needed (pain). Reported on 09/19/2015, Disp: , Rfl:  .   Aspirin-Salicylamide-Caffeine (BC HEADACHE POWDER PO), Take 4 packets by mouth daily as needed (pain)., Disp: , Rfl:  .  citalopram (CELEXA) 20 MG tablet, Take 40 mg by mouth daily. , Disp: , Rfl:  .  clonazePAM (KLONOPIN) 0.5 MG tablet, Take 0.5 mg by mouth 2 (two) times daily as needed for anxiety., Disp: , Rfl:  .  dapagliflozin propanediol (FARXIGA) 5 MG TABS tablet, Take 5 mg by mouth daily., Disp: , Rfl:  .  gabapentin (NEURONTIN) 600 MG tablet, Take 2 tablets (1,200 mg total) by mouth 3 (three) times daily. Take 1 tablets three times per day, Disp: 540 tablet, Rfl: 4 .  ibuprofen (ADVIL,MOTRIN) 800 MG tablet, Take 1 tablet (800 mg total) by mouth every 8 (eight) hours as needed., Disp: 100 tablet, Rfl: 0 .  insulin detemir (LEVEMIR) 100 UNIT/ML injection, Start with 20 units. Increase by 1 unit a day to a maximum of 100 units in the morning as long as morning sugar is over 100., Disp: 90 mL, Rfl: 4 .  Insulin Syringe-Needle U-100 (INSULIN SYRINGE 1CC/31GX5/16") 31G X 5/16" 1 ML MISC, Every AM for Levemir, Disp: 90 each, Rfl: 4 .  medroxyPROGESTERone (PROVERA) 10 MG tablet, Take 10 mg by mouth daily., Disp: , Rfl:  .  metFORMIN (GLUCOPHAGE) 1000 MG tablet, Take 1 tablet (1,000 mg total) by mouth  2 (two) times daily with a meal., Disp: 180 tablet, Rfl: 3 .  Omega-3 Fatty Acids (FISH OIL PO), Take 1 capsule by mouth daily. Reported on 09/19/2015, Disp: , Rfl:  .  traZODone (DESYREL) 50 MG tablet, Take 100 mg by mouth at bedtime. Reported on 09/19/2015, Disp: , Rfl:  .  baclofen (LIORESAL) 10 MG tablet, Take 1 tablet (10 mg total) by mouth 3 (three) times daily as needed for muscle spasms. (Patient not taking: Reported on 05/17/2016), Disp: 30 tablet, Rfl: 0 .  cyclobenzaprine (FLEXERIL) 10 MG tablet, Take 1 tablet (10 mg total) by mouth 2 (two) times daily as needed for muscle spasms. (Patient not taking: Reported on 06/18/2016), Disp: 20 tablet, Rfl: 0 .  Melatonin 1 MG TABS, Take 1 tablet by mouth at  bedtime as needed., Disp: , Rfl:  .  oxyCODONE-acetaminophen (PERCOCET) 5-325 MG per tablet, 1 to 2 tablets every 6 hours as needed for pain. (Patient not taking: Reported on 05/17/2016), Disp: 20 tablet, Rfl: 0  Social History   Social History  . Marital status: Married    Spouse name: N/A  . Number of children: N/A  . Years of education: N/A   Social History Main Topics  . Smoking status: Current Every Day Smoker    Packs/day: 4.50    Years: 33.00    Types: Cigarettes  . Smokeless tobacco: Current User  . Alcohol use No  . Drug use: No  . Sexual activity: Not on file   Other Topics Concern  . Not on file   Social History Narrative   In the process of getting SSD for depression, DM and neuropathy    Family History  Problem Relation Age of Onset  . Kidney disease Father   . Diabetes Father   . Dementia Father   . Diabetes Sister   . Diabetes Brother     Review of Systems A 12 point review of systems was negative except for pertinent items noted in the HPI.   Objective:     Wt Readings from Last 3 Encounters:  06/18/16 172 lb 12.8 oz (78.4 kg)  04/30/16 175 lb 3.2 oz (79.5 kg)  03/11/16 175 lb (79.4 kg)   BP 137/67   Wt 172 lb 12.8 oz (78.4 kg)   LMP 07/20/2015 (Exact Date)   BMI 32.65 kg/m    Lab Review No components found for: A1C Glucose (mg/dL)  Date Value  05/01/2016 282 (H)  02/07/2016 266 (H)  11/07/2015 171 (H)  12/07/2012 274 (H)  04/20/2011 119 (H)   Glucose, Bld (mg/dL)  Date Value  11/11/2015 195 (H)  02/21/2015 255 (H)  05/09/2010 310 (H)   CO2 (mmol/L)  Date Value  05/01/2016 25  02/07/2016 27  11/11/2015 23   Co2 (mmol/L)  Date Value  12/07/2012 25  04/20/2011 28   BUN (mg/dL)  Date Value  05/01/2016 11  02/07/2016 10  11/11/2015 14  11/07/2015 15  12/07/2012 10  04/20/2011 11   Creatinine (mg/dL)  Date Value  12/07/2012 0.63  04/20/2011 0.53 (L)   Creatinine, Ser (mg/dL)  Date Value  05/01/2016 0.70   02/07/2016 0.63  11/11/2015 0.66   No components found for: LDL,  LDLCALC,  LDLDIRECT Lab Results  Component Value Date   NA 138 05/01/2016   K 4.6 05/01/2016   CL 94 (L) 05/01/2016   CO2 25 05/01/2016   BUN 11 05/01/2016   CREATININE 0.70 05/01/2016   GFRAA 118 05/01/2016   GFRNONAA 103  05/01/2016   GLU 288 04/05/2015   CALCIUM 9.6 05/01/2016   ALBUMIN 4.4 05/01/2016   PHOS 4.2 05/01/2016     DIABETES MELLITUS RESULTS: Lab Results  Component Value Date   HGBA1C 10.2 (H) 04/30/2016   HGBA1C 10.6 (H) 02/07/2016   HGBA1C 8.6 (H) 11/15/2015   Lab Results  Component Value Date   LDLCALC 130 (H) 11/07/2015   CREATININE 0.70 05/01/2016   Lab Results  Component Value Date   CHOL 189 11/07/2015   Lab Results  Component Value Date   LDLCALC 130 (H) 11/07/2015   No components found for: CHOLLLDLDIRECT No components found for: MICROALB/CR Lab Results  Component Value Date   GFRAA 118 05/01/2016   GFRNONAA 103 05/01/2016

## 2016-06-18 NOTE — Patient Instructions (Addendum)
Take 35 units of Levemir in the morning and 35 units of Levemir at night. Increase your [place] to 10 mg (take 2 of the 5 mg pills).   Limit soda intake. Try using Stevia in your sweet tea instead of sugar. Look for the brand 'SweetLeaf' at the grocery store.

## 2016-06-20 LAB — GLUCOSE, POCT (MANUAL RESULT ENTRY): POC Glucose: 343 mg/dl — AB (ref 70–99)

## 2016-06-20 LAB — POCT GLYCOSYLATED HEMOGLOBIN (HGB A1C): Hemoglobin A1C: 9.9

## 2016-06-20 NOTE — Telephone Encounter (Signed)
Placed signed application/script in [facility] folder for pickup. 

## 2016-06-20 NOTE — Addendum Note (Signed)
Addended by: Lurena Nida D on: 06/20/2016 01:27 PM   Modules accepted: Orders

## 2016-06-26 ENCOUNTER — Ambulatory Visit: Payer: Self-pay | Admitting: Internal Medicine

## 2016-06-26 ENCOUNTER — Encounter: Payer: Self-pay | Admitting: Internal Medicine

## 2016-06-26 VITALS — BP 137/80 | HR 58 | Wt 168.0 lb

## 2016-06-26 DIAGNOSIS — E119 Type 2 diabetes mellitus without complications: Secondary | ICD-10-CM

## 2016-06-26 DIAGNOSIS — B070 Plantar wart: Secondary | ICD-10-CM

## 2016-06-26 DIAGNOSIS — Z794 Long term (current) use of insulin: Principal | ICD-10-CM

## 2016-06-26 DIAGNOSIS — M549 Dorsalgia, unspecified: Secondary | ICD-10-CM

## 2016-06-26 DIAGNOSIS — G8929 Other chronic pain: Secondary | ICD-10-CM

## 2016-06-26 LAB — GLUCOSE, POCT (MANUAL RESULT ENTRY): POC GLUCOSE: 130 mg/dL — AB (ref 70–99)

## 2016-06-26 MED ORDER — FISH OIL 1000 MG PO CAPS: 1000.0000 mg | ORAL_CAPSULE | Freq: Every day | ORAL | 3 refills | Status: DC

## 2016-06-26 NOTE — Patient Instructions (Addendum)
Pt needs appt with endocrinologist and diabetic eye appt Pt referred to pain clinic and podiatrist Follow up in 2 months before endocrinology appt with labs MEt C, CBC, Lipid, TSH, A1c, UA

## 2016-06-26 NOTE — Progress Notes (Signed)
Subjective:    Patient ID: Brandi Calderon, female    DOB: [birth year], 51 y.o.   MRN: [id number]  HPI  Pt following up today with diabetes with use of insulin Pt fell 5 days ago  Pt also had a plantar removed on her right foot and pt reports regrowth  Pt also has pain in her foot which spreads to her back  Pt smokes 4 packs of day  Pt checks blood sugar 2x a day Pt needs to take insulin and medication if blood sugar is above 100  Patient Active Problem List   Diagnosis Date Noted  . Foot pain, right 04/30/2016  . Depression, major, single episode, moderate ([place]) 03/11/2016  . Neuropathy due to type 2 diabetes mellitus ([place]) - severe 12/19/2015  . Menorrhagia 09/28/2015  . Type 2 diabetes mellitus treated with insulin ([place]) 05/24/2015  . Back pain 05/24/2015   Allergies as of 06/26/2016      Reactions   Penicillins Anaphylaxis   Throat swells   Vicodin [hydrocodone-acetaminophen] Hives   Victoza [liraglutide]    Severe nausea      Medication List       Accurate as of 06/26/16 11:09 AM. Always use your most recent med list.          acetaminophen 500 MG tablet Commonly known as:  TYLENOL Take 2,000 mg by mouth 3 (three) times daily as needed (pain). Reported on 09/19/2015   baclofen 10 MG tablet Commonly known as:  LIORESAL Take 1 tablet (10 mg total) by mouth 3 (three) times daily as needed for muscle spasms.   BC HEADACHE POWDER PO Take 4 packets by mouth daily as needed (pain).   citalopram 20 MG tablet Commonly known as:  CELEXA Take 40 mg by mouth daily.   clonazePAM 0.5 MG tablet Commonly known as:  KLONOPIN Take 0.5 mg by mouth 2 (two) times daily as needed for anxiety.   cyclobenzaprine 10 MG tablet Commonly known as:  FLEXERIL Take 1 tablet (10 mg total) by mouth 2 (two) times daily as needed for muscle spasms.   FARXIGA 5 MG Tabs tablet Generic drug:  dapagliflozin propanediol Take 10 mg by mouth daily.   FISH OIL PO Take 1 capsule by  mouth daily. Reported on 09/19/2015   gabapentin 600 MG tablet Commonly known as:  NEURONTIN Take 2 tablets (1,200 mg total) by mouth 3 (three) times daily. Take 1 tablets three times per day   ibuprofen 800 MG tablet Commonly known as:  ADVIL,MOTRIN Take 1 tablet (800 mg total) by mouth every 8 (eight) hours as needed.   insulin detemir 100 UNIT/ML injection Commonly known as:  LEVEMIR Inject 0.35 mLs (35 Units total) into the skin 2 (two) times daily. Start with 20 units. Increase by 1 unit a day to a maximum of 100 units in the morning as long as morning sugar is over 100.   INSULIN SYRINGE 1CC/31GX5/16" 31G X 5/16" 1 ML Misc Every AM for Levemir   medroxyPROGESTERone 10 MG tablet Commonly known as:  PROVERA Take 10 mg by mouth daily.   Melatonin 1 MG Tabs Take 1 tablet by mouth at bedtime as needed.   metFORMIN 1000 MG tablet Commonly known as:  GLUCOPHAGE Take 1 tablet (1,000 mg total) by mouth 2 (two) times daily with a meal.   oxyCODONE-acetaminophen 5-325 MG tablet Commonly known as:  PERCOCET 1 to 2 tablets every 6 hours as needed for pain.   simvastatin 20 MG tablet  Commonly known as:  ZOCOR Take 1 tablet (20 mg total) by mouth daily.   traZODone 50 MG tablet Commonly known as:  DESYREL Take 100 mg by mouth at bedtime. Reported on 09/19/2015        Review of Systems BP 137/80   Pulse (!) 58   Wt 168 lb (76.2 kg)   LMP 07/20/2015 (Exact Date)   BMI 31.74 kg/m      Objective:   Physical Exam  Constitutional: She is oriented to person, place, and time.  Cardiovascular: Normal rate and regular rhythm.   Pulmonary/Chest: Effort normal and breath sounds normal.  Neurological: She is alert and oriented to person, place, and time.          Assessment & Plan:  Pt to follow up with endocrinologist  Pt requested allergy medications and fish oil  Pt referred to pain clinic Pt referred to podiatry for plantar wart  Follow up in 2 months before  endocrinology appt with labs MEt C, CBC, Lipid, TSH, A1c, UA

## 2016-08-06 ENCOUNTER — Telehealth: Payer: Self-pay | Admitting: Pharmacy Technician

## 2016-08-06 NOTE — Telephone Encounter (Signed)
Patient approved to receive medication assistance at [facility] through 2018, as long as eligibility criteria continues to be met.  Betty J. Kluttz Care Manager [facility]  

## 2016-08-27 ENCOUNTER — Telehealth: Payer: Self-pay | Admitting: Pharmacist

## 2016-08-27 NOTE — Telephone Encounter (Signed)
08/27/16 Faxed Re Enrollment application to [employer] for Levemir Vials Inject 20 units under the skin before breakfast, increase by 1 unit until BG < 130. Max daily dose 100 units.

## 2016-09-04 ENCOUNTER — Telehealth: Payer: Self-pay | Admitting: Pharmacist

## 2016-09-04 ENCOUNTER — Ambulatory Visit: Payer: Self-pay | Admitting: Internal Medicine

## 2016-09-04 NOTE — Telephone Encounter (Signed)
09/04/16 Called [place] for refill on Farxiga.

## 2016-09-05 ENCOUNTER — Emergency Department (HOSPITAL_COMMUNITY)
Admission: EM | Admit: 2016-09-05 | Discharge: 2016-09-05 | Disposition: A | Discharge status: Home / Self Care | Payer: Self-pay | Attending: Emergency Medicine | Admitting: Emergency Medicine

## 2016-09-05 ENCOUNTER — Encounter (HOSPITAL_COMMUNITY): Payer: Self-pay | Admitting: Emergency Medicine

## 2016-09-05 ENCOUNTER — Emergency Department (HOSPITAL_COMMUNITY): Payer: Self-pay

## 2016-09-05 DIAGNOSIS — R9389 Abnormal findings on diagnostic imaging of other specified body structures: Secondary | ICD-10-CM

## 2016-09-05 DIAGNOSIS — Z7982 Long term (current) use of aspirin: Secondary | ICD-10-CM | POA: Insufficient documentation

## 2016-09-05 DIAGNOSIS — J189 Pneumonia, unspecified organism: Secondary | ICD-10-CM

## 2016-09-05 DIAGNOSIS — F17210 Nicotine dependence, cigarettes, uncomplicated: Secondary | ICD-10-CM | POA: Insufficient documentation

## 2016-09-05 DIAGNOSIS — I10 Essential (primary) hypertension: Secondary | ICD-10-CM | POA: Insufficient documentation

## 2016-09-05 DIAGNOSIS — Z5181 Encounter for therapeutic drug level monitoring: Secondary | ICD-10-CM | POA: Insufficient documentation

## 2016-09-05 DIAGNOSIS — Z794 Long term (current) use of insulin: Secondary | ICD-10-CM | POA: Insufficient documentation

## 2016-09-05 DIAGNOSIS — J181 Lobar pneumonia, unspecified organism: Secondary | ICD-10-CM

## 2016-09-05 DIAGNOSIS — Z79899 Other long term (current) drug therapy: Secondary | ICD-10-CM | POA: Insufficient documentation

## 2016-09-05 DIAGNOSIS — E119 Type 2 diabetes mellitus without complications: Secondary | ICD-10-CM | POA: Insufficient documentation

## 2016-09-05 LAB — COMPREHENSIVE METABOLIC PANEL
ALT: 12 U/L — AB (ref 14–54)
ANION GAP: 13 (ref 5–15)
AST: 13 U/L — ABNORMAL LOW (ref 15–41)
Albumin: 3.6 g/dL (ref 3.5–5.0)
Alkaline Phosphatase: 93 U/L (ref 38–126)
BILIRUBIN TOTAL: 1.2 mg/dL (ref 0.3–1.2)
BUN: 9 mg/dL (ref 6–20)
CALCIUM: 8.9 mg/dL (ref 8.9–10.3)
CO2: 22 mmol/L (ref 22–32)
CREATININE: 0.76 mg/dL (ref 0.44–1.00)
Chloride: 97 mmol/L — ABNORMAL LOW (ref 101–111)
GFR calc non Af Amer: >60 mL/min (ref >60)
Glucose, Bld: 231 mg/dL — ABNORMAL HIGH (ref 65–99)
Potassium: 3.7 mmol/L (ref 3.5–5.1)
Sodium: 132 mmol/L — ABNORMAL LOW (ref 135–145)
TOTAL PROTEIN: 7 g/dL (ref 6.5–8.1)

## 2016-09-05 LAB — CBC
HCT: 40.7 % (ref 36.0–46.0)
HEMOGLOBIN: 13.4 g/dL (ref 12.0–15.0)
MCH: 27.9 pg (ref 26.0–34.0)
MCHC: 32.9 g/dL (ref 30.0–36.0)
MCV: 84.6 fL (ref 78.0–100.0)
PLATELETS: 219 10*3/uL (ref 150–400)
RBC: 4.81 MIL/uL (ref 3.87–5.11)
RDW: 15.3 % (ref 11.5–15.5)
WBC: 14.8 10*3/uL — ABNORMAL HIGH (ref 4.0–10.5)

## 2016-09-05 LAB — URINALYSIS, ROUTINE W REFLEX MICROSCOPIC
Bacteria, UA: NONE SEEN
Bilirubin Urine: NEGATIVE
HGB URINE DIPSTICK: NEGATIVE
Ketones, ur: 80 mg/dL — AB
Leukocytes, UA: NEGATIVE
NITRITE: NEGATIVE
PH: 5 (ref 5.0–8.0)
Protein, ur: 30 mg/dL — AB
Specific Gravity, Urine: 1.022 (ref 1.005–1.030)

## 2016-09-05 LAB — PROTIME-INR
INR: 1.02
PROTHROMBIN TIME: 13.4 s (ref 11.4–15.2)

## 2016-09-05 LAB — POC URINE PREG, ED: Preg Test, Ur: NEGATIVE

## 2016-09-05 LAB — I-STAT CG4 LACTIC ACID, ED: Lactic Acid, Venous: 1.36 mmol/L (ref 0.5–1.9)

## 2016-09-05 LAB — CBG MONITORING, ED: Glucose-Capillary: 200 mg/dL — ABNORMAL HIGH (ref 65–99)

## 2016-09-05 LAB — LIPASE, BLOOD: Lipase: 20 U/L (ref 11–51)

## 2016-09-05 LAB — MAGNESIUM: MAGNESIUM: 1.4 mg/dL — AB (ref 1.7–2.4)

## 2016-09-05 MED ORDER — LEVOFLOXACIN 750 MG PO TABS
750.0000 mg | ORAL_TABLET | Freq: Once | ORAL | Status: AC
  Administered 2016-09-05: 750 mg via ORAL
  Filled 2016-09-05: qty 1

## 2016-09-05 MED ORDER — ONDANSETRON HCL 4 MG PO TABS: 4.0000 mg | ORAL_TABLET | Freq: Three times a day (TID) | ORAL | 0 refills | Status: DC | PRN

## 2016-09-05 MED ORDER — MAGNESIUM OXIDE 400 (241.3 MG) MG PO TABS
400.0000 mg | ORAL_TABLET | ORAL | Status: AC
  Administered 2016-09-05: 400 mg via ORAL
  Filled 2016-09-05: qty 1

## 2016-09-05 MED ORDER — LEVOFLOXACIN 500 MG PO TABS: 500.0000 mg | ORAL_TABLET | Freq: Every day | ORAL | 0 refills | Status: AC

## 2016-09-05 MED ORDER — ONDANSETRON HCL 4 MG/2ML IJ SOLN
4.0000 mg | Freq: Once | INTRAMUSCULAR | Status: AC
  Administered 2016-09-05: 4 mg via INTRAVENOUS
  Filled 2016-09-05: qty 2

## 2016-09-05 MED ORDER — SODIUM CHLORIDE 0.9 % IV BOLUS (SEPSIS)
1000.0000 mL | Freq: Once | INTRAVENOUS | Status: AC
  Administered 2016-09-05: 1000 mL via INTRAVENOUS

## 2016-09-05 MED ORDER — ACETAMINOPHEN 325 MG PO TABS
650.0000 mg | ORAL_TABLET | Freq: Once | ORAL | Status: AC
  Administered 2016-09-05: 650 mg via ORAL
  Filled 2016-09-05: qty 2

## 2016-09-05 MED ORDER — IOPAMIDOL (ISOVUE-300) INJECTION 61%
INTRAVENOUS | Status: AC
  Administered 2016-09-05: 75 mL
  Filled 2016-09-05: qty 75

## 2016-09-05 NOTE — ED Provider Notes (Signed)
[facility] DEPT Provider Note   CSN: [id number] Arrival date & time: 09/05/16  3785     History   Chief Complaint Chief Complaint  Patient presents with  . Generalized Body Aches  . Emesis    HPI Brandi Calderon is a 51 y.o. female.  The history is provided by the patient and a relative. No language interpreter was used.  Emesis   This is a new problem. The current episode started more than 2 days ago. The problem occurs continuously. The problem has not changed since onset.The emesis has an appearance of stomach contents. Maximum temperature: subjecive. The fever has been present for 1 to 2 days. Associated symptoms include abdominal pain, chills, cough, diarrhea, headaches, myalgias and sweats. Pertinent negatives include no fever.  Diarrhea   This is a new problem. The current episode started more than 2 days ago. The problem occurs continuously. The problem has not changed since onset.The stool consistency is described as watery. Associated symptoms include abdominal pain, vomiting, chills, sweats, headaches, myalgias and cough. She has tried nothing for the symptoms.    Past Medical History:  Diagnosis Date  . Arthritis   . Back pain   . Depression   . Diabetes mellitus   . Headache   . Hypertension   . Tendonitis     Patient Active Problem List   Diagnosis Date Noted  . Foot pain, right 04/30/2016  . Depression, major, single episode, moderate ([place]) 03/11/2016  . Neuropathy due to type 2 diabetes mellitus ([place]) - severe 12/19/2015  . Menorrhagia 09/28/2015  . Type 2 diabetes mellitus treated with insulin ([place]) 05/24/2015  . Back pain 05/24/2015    Past Surgical History:  Procedure Laterality Date  . CESAREAN SECTION    . CESAREAN SECTION      OB History    No data available       Home Medications    Prior to Admission medications   Medication Sig Start Date End Date Taking? Authorizing Provider  acetaminophen (TYLENOL) 500 MG tablet Take  2,000 mg by mouth 3 (three) times daily as needed (pain). Reported on 09/19/2015    [provider]  Aspirin-Salicylamide-Caffeine (BC HEADACHE POWDER PO) Take 4 packets by mouth daily as needed (pain).    [provider]  baclofen (LIORESAL) 10 MG tablet Take 1 tablet (10 mg total) by mouth 3 (three) times daily as needed for muscle spasms. Patient not taking: Reported on 05/17/2016 12/23/15   Hagler, Jami L, PA-C  citalopram (CELEXA) 20 MG tablet Take 40 mg by mouth daily.     [provider]  clonazePAM (KLONOPIN) 0.5 MG tablet Take 0.5 mg by mouth 2 (two) times daily as needed for anxiety.    [provider]  cyclobenzaprine (FLEXERIL) 10 MG tablet Take 1 tablet (10 mg total) by mouth 2 (two) times daily as needed for muscle spasms. Patient not taking: Reported on 06/18/2016 04/21/14   Britt Bottom, NP  dapagliflozin propanediol (FARXIGA) 5 MG TABS tablet Take 10 mg by mouth daily.    [provider]  gabapentin (NEURONTIN) 600 MG tablet Take 2 tablets (1,200 mg total) by mouth 3 (three) times daily. Take 1 tablets three times per day 12/19/15   Barnet Pall, MD  ibuprofen (ADVIL,MOTRIN) 800 MG tablet Take 1 tablet (800 mg total) by mouth every 8 (eight) hours as needed. 02/13/16   Birdie Sons, MD  insulin detemir (LEVEMIR) 100 UNIT/ML injection Inject 0.35 mLs (35 Units total)  into the skin 2 (two) times daily. Start with 20 units. Increase by 1 unit a day to a maximum of 100 units in the morning as long as morning sugar is over 100. 06/18/16   Bertram Gala, PA  Insulin Syringe-Needle U-100 (INSULIN SYRINGE 1CC/31GX5/16") 31G X 5/16" 1 ML MISC Every AM for Levemir 12/19/15   Barnet Pall, MD  medroxyPROGESTERone (PROVERA) 10 MG tablet Take 10 mg by mouth daily.    [provider]  Melatonin 1 MG TABS Take 1 tablet by mouth at bedtime as needed.    [provider]  metFORMIN (GLUCOPHAGE) 1000 MG tablet Take 1 tablet (1,000 mg  total) by mouth 2 (two) times daily with a meal. 09/19/15   Ewell Poe, MD  Omega-3 Fatty Acids (FISH OIL) 1000 MG CAPS Take 1 capsule (1,000 mg total) by mouth daily. Reported on 09/19/2015 06/26/16   Tawni Millers, MD  oxyCODONE-acetaminophen (PERCOCET) 5-325 MG per tablet 1 to 2 tablets every 6 hours as needed for pain. Patient not taking: Reported on 05/17/2016 11/27/13   Harden Mo, MD  simvastatin (ZOCOR) 20 MG tablet Take 1 tablet (20 mg total) by mouth daily. 06/18/16   Bertram Gala, PA  traZODone (DESYREL) 50 MG tablet Take 100 mg by mouth at bedtime. Reported on 09/19/2015    [provider]    Family History Family History  Problem Relation Age of Onset  . Kidney disease Father   . Diabetes Father   . Dementia Father   . Diabetes Sister   . Diabetes Brother     Social History Social History  Substance Use Topics  . Smoking status: Current Every Day Smoker    Packs/day: 4.50    Years: 33.00    Types: Cigarettes  . Smokeless tobacco: Current User  . Alcohol use No     Allergies   Penicillins; Vicodin [hydrocodone-acetaminophen]; and Victoza [liraglutide]   Review of Systems Review of Systems  Constitutional: Positive for chills and fatigue. Negative for appetite change, diaphoresis and fever.  HENT: Negative for congestion and rhinorrhea.   Eyes: Negative for visual disturbance.  Respiratory: Positive for cough. Negative for chest tightness, shortness of breath, wheezing and stridor.   Cardiovascular: Negative for chest pain, palpitations and leg swelling.  Gastrointestinal: Positive for abdominal pain, diarrhea, nausea and vomiting. Negative for constipation.  Genitourinary: Positive for decreased urine volume. Negative for dysuria, flank pain and vaginal bleeding.  Musculoskeletal: Positive for myalgias. Negative for neck pain and neck stiffness.  Skin: Negative for rash and wound.  Neurological: Positive for light-headedness and headaches.  Negative for dizziness and seizures.  All other systems reviewed and are negative.    Physical Exam Updated Vital Signs BP 124/65   Pulse 78   Temp 99.2 F (37.3 C) (Oral)   Resp 17   LMP 07/20/2015 (Exact Date)   SpO2 94%   Physical Exam  Constitutional: She is oriented to person, place, and time. She appears well-developed and well-nourished. No distress.  HENT:  Head: Normocephalic and atraumatic.  Mouth/Throat: Oropharynx is clear and moist. No oropharyngeal exudate.  Eyes: Conjunctivae and EOM are normal. Pupils are equal, round, and reactive to light.  Neck: Normal range of motion. Neck supple.  Cardiovascular: Normal rate, regular rhythm and intact distal pulses.   No murmur heard. Pulmonary/Chest: Effort normal and breath sounds normal. No stridor. No respiratory distress. She has no wheezes. She has no rales. She exhibits no tenderness.  Abdominal:  Soft. There is no tenderness.  Musculoskeletal: She exhibits tenderness. She exhibits no edema.       Thoracic back: She exhibits tenderness and pain.       Back:  Neurological: She is alert and oriented to person, place, and time. No cranial nerve deficit or sensory deficit. She exhibits normal muscle tone.  Skin: Skin is warm and dry. Capillary refill takes less than 2 seconds. No rash noted. She is not diaphoretic. No erythema.  Psychiatric: She has a normal mood and affect.  Nursing note and vitals reviewed.    ED Treatments / Results  Labs (all labs ordered are listed, but only abnormal results are displayed) Labs Reviewed  COMPREHENSIVE METABOLIC PANEL - Abnormal; Notable for the following:       Result Value   Sodium 132 (*)    Chloride 97 (*)    Glucose, Bld 231 (*)    AST 13 (*)    ALT 12 (*)    All other components within normal limits  CBC - Abnormal; Notable for the following:    WBC 14.8 (*)    All other components within normal limits  URINALYSIS, ROUTINE W REFLEX MICROSCOPIC - Abnormal; Notable  for the following:    APPearance HAZY (*)    Glucose, UA >=500 (*)    Ketones, ur 80 (*)    Protein, ur 30 (*)    Squamous Epithelial / LPF 0-5 (*)    All other components within normal limits  MAGNESIUM - Abnormal; Notable for the following:    Magnesium 1.4 (*)    All other components within normal limits  CBG MONITORING, ED - Abnormal; Notable for the following:    Glucose-Capillary 200 (*)    All other components within normal limits  LIPASE, BLOOD  PROTIME-INR  I-STAT CG4 LACTIC ACID, ED  POC URINE PREG, ED    EKG  EKG Interpretation None       Radiology Dg Chest 2 View  Result Date: 09/05/2016 CLINICAL DATA:  Cough and chills for 2 days EXAM: CHEST  2 VIEW COMPARISON:  01/30/2013 FINDINGS: Cardiac shadow is within normal limits. The lungs are well aerated bilaterally. There is a 4 cm soft tissue density projecting over the right lung apex. Underlying mass lesion cannot be totally excluded. CT of the chest is recommended for further evaluation. No other focal abnormality is noted. IMPRESSION: Apparent mass lesion in the right apex. CT of the chest is recommended for further evaluation. Electronically Signed   By: Inez Catalina M.D.   On: 09/05/2016 09:35   Ct Chest W Contrast  Result Date: 09/05/2016 CLINICAL DATA:  Abnormal chest x-ray.  Cough. EXAM: CT CHEST WITH CONTRAST TECHNIQUE: Multidetector CT imaging of the chest was performed during intravenous contrast administration. CONTRAST:  <See Chart> ISOVUE-300 IOPAMIDOL (ISOVUE-300) INJECTION 61% COMPARISON:  Chest x-ray earlier today. FINDINGS: Cardiovascular: Heart is normal size. Aorta is normal caliber. Scattered calcifications in the left anterior descending coronary artery. Mediastinum/Nodes: Prominent right hilar lymph node measuring up to 16 mm in short axis diameter on image 58. Prominent mediastinal lymph nodes with right paratracheal node having a short axis diameter of 14 mm. No axillary adenopathy. Lungs/Pleura:  Airspace consolidation noted in the right upper lobe most compatible with pneumonia. No additional focal airspace opacities or effusions. Upper Abdomen: Imaging into the upper abdomen shows no acute findings. Musculoskeletal: Chest wall soft tissues are unremarkable. No acute bony abnormality. IMPRESSION: Airspace opacification in the peripheral right upper lobe corresponding  to the density seen on prior chest x-ray, with an appearance most compatible with pneumonia. Recommend follow-up after treatment in 4-8 weeks with CT to ensure resolution. Mildly enlarged mediastinal and right hilar lymph nodes. These may be reactive. However, recommend attention on follow-up imaging. Electronically Signed   By: Rolm Baptise M.D.   On: 09/05/2016 11:01    Procedures Procedures (including critical care time)  Medications Ordered in ED Medications  sodium chloride 0.9 % bolus 1,000 mL (0 mLs Intravenous Stopped 09/05/16 1121)  sodium chloride 0.9 % bolus 1,000 mL (0 mLs Intravenous Stopped 09/05/16 1011)  ondansetron (ZOFRAN) injection 4 mg (4 mg Intravenous Given 09/05/16 0911)  iopamidol (ISOVUE-300) 61 % injection (75 mLs  Contrast Given 09/05/16 1042)  levofloxacin (LEVAQUIN) tablet 750 mg (750 mg Oral Given 09/05/16 1342)  acetaminophen (TYLENOL) tablet 650 mg (650 mg Oral Given 09/05/16 1342)  magnesium oxide (MAG-OX) tablet 400 mg (400 mg Oral Given 09/05/16 1359)     Initial Impression / Assessment and Plan / ED Course  I have reviewed the triage vital signs and the nursing notes.  Pertinent labs & imaging results that were available during my care of the patient were reviewed by me and considered in my medical decision making (see chart for details).     Brandi Calderon is a 51 y.o. female With a past medical history significant for hypertension, depression, and diabetes who presents with several days of nausea, vomiting, diarrhea, fatigue, abdominal cramping, and cough. Patient is accompanied by family  reports that she is felt that the last few days. Patient has had nausea, vomiting, and diarrhea primarily.  Patient is not been able to eat or drink well the last few days. Family was concerned she may be dehydrated.  History and exam are seen above. On exam, patient is alert and oriented. Patient has no significant abdominal tenderness. No focal neurologic deficits. Lungs are clear. Patient's mucous membranes are dry. Suspect dehydration.  Patient given nausea medicine and fluids.  Patient felt much better after fluids. Patient had lab testing and imaging showing concern for dehydration and pneumonia. Initially, massing on x-ray and CT scan recommended. CT ordered and shows pneumonia. Patient does not have DKA. Do not feel patient is septic.   Given patient's improvement in vital signs and feeling much better, patient felt stable for outpatient management of pneumonia. Patient given prescription for nausea medicine, antibiotics for pneumonia, and instructions for good rehydration. You will follow up with PCP in the next few days.  Family and patient was understanding of the plan of care with strict return precautions. Patient discharged in good condition.    Final Clinical Impressions(s) / ED Diagnoses   Final diagnoses:  Pneumonia of right upper lobe due to infectious organism [facility])  Community acquired pneumonia of right upper lobe of lung ([place])    New Prescriptions Discharge Medication List as of 09/05/2016  1:58 PM    START taking these medications   Details  levofloxacin (LEVAQUIN) 500 MG tablet Take 1 tablet (500 mg total) by mouth daily., Starting Fri 09/06/2016, Until Tue 09/10/2016, Print    ondansetron (ZOFRAN) 4 MG tablet Take 1 tablet (4 mg total) by mouth every 8 (eight) hours as needed for nausea or vomiting., Starting Thu 09/05/2016, Print        Clinical Impression: 1. Pneumonia of right upper lobe due to infectious organism ([place])   2. Abnormal CXR   3. Community  acquired pneumonia of right upper lobe of lung (  [place])     Disposition: Discharge  Condition: Good  I have discussed the results, Dx and Tx plan with the pt(& family if present). He/she/they expressed understanding and agree(s) with the plan. Discharge instructions discussed at great length. Strict return precautions discussed and pt &/or family have verbalized understanding of the instructions. No further questions at time of discharge.    Discharge Medication List as of 09/05/2016  1:58 PM    START taking these medications   Details  levofloxacin (LEVAQUIN) 500 MG tablet Take 1 tablet (500 mg total) by mouth daily., Starting Fri 09/06/2016, Until Tue 09/10/2016, Print    ondansetron (ZOFRAN) 4 MG tablet Take 1 tablet (4 mg total) by mouth every 8 (eight) hours as needed for nausea or vomiting., Starting Thu 09/05/2016, Print        Follow Up: [facility] [address], [place] [address] [phone]  Schedule an appointment as soon as possible for a visit    [place] [address] [id number] [place] [phone]  If symptoms worsen     Jaidev Sanger, Gwenyth Allegra, MD 09/05/16 2242

## 2016-09-05 NOTE — ED Triage Notes (Signed)
Pts daughter reports generalized body ache/n/v x 2 days and states she is diabetic and her cbg was 80 this am, they gave her some coke and got sugar up to 120. Pt a/ox4, poor historian. Pt drinking soda in triage.

## 2016-09-05 NOTE — Discharge Instructions (Signed)
Please take your antibiotics once a day starting tomorrow. You received a dose today here. Please use the nausea medicine to help with your nausea. Please stay hydrated. Please follow-up with your PCP if any symptoms change or worsen, please return to the nearest emergency department.

## 2016-09-10 ENCOUNTER — Other Ambulatory Visit: Payer: Self-pay

## 2016-09-11 ENCOUNTER — Other Ambulatory Visit: Payer: Self-pay

## 2016-09-11 DIAGNOSIS — E119 Type 2 diabetes mellitus without complications: Secondary | ICD-10-CM

## 2016-09-11 DIAGNOSIS — Z794 Long term (current) use of insulin: Principal | ICD-10-CM

## 2016-09-12 LAB — CBC WITH DIFFERENTIAL
BASOS ABS: 0 10*3/uL (ref 0.0–0.2)
Basos: 0 %
EOS (ABSOLUTE): 0.2 10*3/uL (ref 0.0–0.4)
Eos: 2 %
HEMOGLOBIN: 13.8 g/dL (ref 11.1–15.9)
Hematocrit: 44.2 % (ref 34.0–46.6)
IMMATURE GRANS (ABS): 0 10*3/uL (ref 0.0–0.1)
IMMATURE GRANULOCYTES: 0 %
LYMPHS ABS: 3.3 10*3/uL — AB (ref 0.7–3.1)
LYMPHS: 35 %
MCH: 27.7 pg (ref 26.6–33.0)
MCHC: 31.2 g/dL — AB (ref 31.5–35.7)
MCV: 89 fL (ref 79–97)
MONOCYTES: 6 %
Monocytes Absolute: 0.6 10*3/uL (ref 0.1–0.9)
Neutrophils Absolute: 5.4 10*3/uL (ref 1.4–7.0)
Neutrophils: 57 %
RBC: 4.98 x10E6/uL (ref 3.77–5.28)
RDW: 15.7 % — ABNORMAL HIGH (ref 12.3–15.4)
WBC: 9.5 10*3/uL (ref 3.4–10.8)

## 2016-09-12 LAB — COMPREHENSIVE METABOLIC PANEL
A/G RATIO: 1.5 (ref 1.2–2.2)
ALT: 15 IU/L (ref 0–32)
AST: 11 IU/L (ref 0–40)
Albumin: 4.3 g/dL (ref 3.5–5.5)
Alkaline Phosphatase: 102 IU/L (ref 39–117)
BUN/Creatinine Ratio: 19 (ref 9–23)
BUN: 12 mg/dL (ref 6–24)
CHLORIDE: 99 mmol/L (ref 96–106)
CO2: 29 mmol/L (ref 20–29)
Calcium: 10.1 mg/dL (ref 8.7–10.2)
Creatinine, Ser: 0.63 mg/dL (ref 0.57–1.00)
GFR calc Af Amer: 122 mL/min/{1.73_m2} (ref >59)
GFR calc non Af Amer: 106 mL/min/{1.73_m2} (ref >59)
Globulin, Total: 2.8 g/dL (ref 1.5–4.5)
Glucose: 128 mg/dL — ABNORMAL HIGH (ref 65–99)
POTASSIUM: 5.3 mmol/L — AB (ref 3.5–5.2)
Sodium: 142 mmol/L (ref 134–144)
Total Protein: 7.1 g/dL (ref 6.0–8.5)

## 2016-09-12 LAB — LIPID PANEL
CHOLESTEROL TOTAL: 152 mg/dL (ref 100–199)
Chol/HDL Ratio: 5.8 ratio — ABNORMAL HIGH (ref 0.0–4.4)
HDL: 26 mg/dL — ABNORMAL LOW (ref >39)
LDL CALC: 91 mg/dL (ref 0–99)
Triglycerides: 174 mg/dL — ABNORMAL HIGH (ref 0–149)
VLDL Cholesterol Cal: 35 mg/dL (ref 5–40)

## 2016-09-12 LAB — URINALYSIS
BILIRUBIN UA: NEGATIVE
KETONES UA: NEGATIVE
LEUKOCYTES UA: NEGATIVE
NITRITE UA: NEGATIVE
PROTEIN UA: NEGATIVE
RBC UA: NEGATIVE
Urobilinogen, Ur: 0.2 mg/dL (ref 0.2–1.0)
pH, UA: 5.5 (ref 5.0–7.5)

## 2016-09-12 LAB — HEMOGLOBIN A1C
Est. average glucose Bld gHb Est-mCnc: 203 mg/dL
Hgb A1c MFr Bld: 8.7 % — ABNORMAL HIGH (ref 4.8–5.6)

## 2016-09-17 ENCOUNTER — Ambulatory Visit: Payer: Self-pay | Admitting: Endocrinology

## 2016-09-17 VITALS — BP 128/81 | HR 69 | Wt 160.5 lb

## 2016-09-17 DIAGNOSIS — E119 Type 2 diabetes mellitus without complications: Secondary | ICD-10-CM

## 2016-09-17 LAB — GLUCOSE, POCT (MANUAL RESULT ENTRY): POC GLUCOSE: 113 mg/dL — AB (ref 70–99)

## 2016-09-17 NOTE — Progress Notes (Signed)
[facility] Progress Note  Scribing for [name], [place] 4 09/17/2016  Name: Brandi Calderon  MRN: [id number]  Date of Birth: 01/26/1966  Chief Complaint: No chief complaint on file.   HPI: HPI   Pt is here with her peer support specialist.  Past Medical History:  Past Medical History:  Diagnosis Date  . Arthritis   . Back pain   . Depression   . Diabetes mellitus   . Headache   . Hypertension   . Tendonitis     Past Surgical History:  Past Surgical History:  Procedure Laterality Date  . CESAREAN SECTION    . CESAREAN SECTION      Past Family History:  Family History  Problem Relation Age of Onset  . Kidney disease Father   . Diabetes Father   . Dementia Father   . Diabetes Sister   . Diabetes Brother     Diabetes Management:  Lab Results  Component Value Date   HGBA1C 8.7 (H) 09/11/2016    Highest blood sugar seen in last 1-2 months? 300  Reported blood sugar average: 300  Trouble with managing blood sugar : Her blood sugar was 300 a few days ago because she had pneumonia and stopped taking her medications for a few days.   New Complaints:  Pt has been taking fluid pills which have decreased her blood sugar to 130. Her blood sugars have been fluctuating from 130-300. Pt reports she drinks Coke and Sweet Tea. She drinks a 2 quart pitcher of tea a day.  Pt reports she has occasional hyperglycemic symptoms. Most recent was a few days ago.  Pt reports occasional burning in her feet, legs, and in her low back.   Personal Diabetes Goal: Cutting back on her intake of sugar in her drinks and increasing her intake of water. Increasing physical activity to 1 day a week to start.     Clinical Assessment & Plan  A1C today is 8.7.  Discussed with Pt on increasing her amount of water and decreasing her intake of sugary drinks. Encouraged pt to decrease the amount of sugar she adds to her tea and drink water in between. Encouraged pt  to begin doing physical activity at least once a week.   Full sensory in all toes. No feeling over calloses.

## 2016-09-17 NOTE — Progress Notes (Signed)
Patient seen with medical student, and I agree with her note. Her A1C is improved, but still above goal. She drinks massive amounts of sweet tea. Encouraged to cut back and try to switch to artificial sweetener. Encouraged to go to [facility].

## 2016-09-18 ENCOUNTER — Other Ambulatory Visit: Payer: Self-pay | Admitting: Ophthalmology

## 2016-09-18 ENCOUNTER — Ambulatory Visit: Payer: Self-pay | Admitting: Ophthalmology

## 2016-09-18 ENCOUNTER — Ambulatory Visit: Payer: Self-pay | Admitting: Internal Medicine

## 2016-09-18 VITALS — BP 136/78 | HR 66 | Temp 98.6°F | Wt 159.8 lb

## 2016-09-18 DIAGNOSIS — IMO0002 Reserved for concepts with insufficient information to code with codable children: Secondary | ICD-10-CM

## 2016-09-18 DIAGNOSIS — R229 Localized swelling, mass and lump, unspecified: Secondary | ICD-10-CM

## 2016-09-18 DIAGNOSIS — E119 Type 2 diabetes mellitus without complications: Secondary | ICD-10-CM

## 2016-09-18 LAB — HM DIABETES EYE EXAM

## 2016-09-18 LAB — GLUCOSE, POCT (MANUAL RESULT ENTRY): POC Glucose: 137 mg/dl — AB (ref 70–99)

## 2016-09-18 MED ORDER — CYCLOBENZAPRINE HCL 10 MG PO TABS: 10.0000 mg | ORAL_TABLET | Freq: Every day | ORAL | 3 refills | Status: DC

## 2016-09-18 MED ORDER — MEDROXYPROGESTERONE ACETATE 10 MG PO TABS: 10.0000 mg | ORAL_TABLET | Freq: Every day | ORAL | 11 refills | Status: DC

## 2016-09-18 NOTE — Addendum Note (Signed)
Addended by: Charlie Pitter D on: 09/18/2016 12:40 PM   Modules accepted: Orders

## 2016-09-18 NOTE — Progress Notes (Signed)
Subjective:    Patient ID: Brandi Calderon, female    DOB: November 14, 1965, 51 y.o.   MRN: [id number]  HPI   Pt is here for F/U Pt recently went to hospital for pneumonia   Pt reports experiencing menopausal symptoms     Patient Active Problem List   Diagnosis Date Noted  . Foot pain, right 04/30/2016  . Depression, major, single episode, moderate ([place]) 03/11/2016  . 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 Menorrhagia 09/28/2015  . Type 2 diabetes mellitus treated with insulin ([place]) 05/24/2015  . Back pain 05/24/2015      Review of Systems     Objective:   Physical Exam  Constitutional: She is oriented to person, place, and time.  Cardiovascular: Normal rate, regular rhythm, normal heart sounds and intact distal pulses.   Pulmonary/Chest: Effort normal and breath sounds normal.  Neurological: She is alert and oriented to person, place, and time.     BP 136/78   Pulse 66   Temp 98.6 F (37 C)   Wt 159 lb 12.8 oz (72.5 kg)   LMP 07/20/2015 (Exact Date)   BMI 30.19 kg/m     Allergies as of 09/18/2016      Reactions   Penicillins Anaphylaxis   Has patient had a PCN reaction causing immediate rash, facial/tongue/throat swelling, SOB or lightheadedness with hypotension: Yes Has patient had a PCN reaction causing severe rash involving mucus membranes or skin necrosis: No Has patient had a PCN reaction that required hospitalization: No Has patient had a PCN reaction occurring within the last 10 years: No If all of the above answers are "NO", then may proceed with Cephalosporin use. Throat swells   Vicodin [hydrocodone-acetaminophen] Hives   Victoza [liraglutide]    Severe nausea      Medication List       Accurate as of 09/18/16  9:55 AM. Always use your most recent med list.          baclofen 10 MG tablet Commonly known as:  LIORESAL Take 1 tablet (10 mg total) by mouth 3 (three) times daily as needed for muscle spasms.   BC  HEADACHE POWDER PO Take 4 packets by mouth daily as needed (pain).   citalopram 20 MG tablet Commonly known as:  CELEXA Take 40 mg by mouth 2 (two) times daily.   clonazePAM 0.5 MG tablet Commonly known as:  KLONOPIN Take 0.5 mg by mouth 2 (two) times daily as needed for anxiety (Sometimes takes 3 tablets a day).   cyclobenzaprine 10 MG tablet Commonly known as:  FLEXERIL Take 1 tablet (10 mg total) by mouth 2 (two) times daily as needed for muscle spasms.   FARXIGA 5 MG Tabs tablet Generic drug:  dapagliflozin propanediol Take 10 mg by mouth daily.   Fish Oil 1000 MG Caps Take 1 capsule (1,000 mg total) by mouth daily. Reported on 09/19/2015   FUROSEMIDE PO Take 2 tablets by mouth daily.   gabapentin 600 MG tablet Commonly known as:  NEURONTIN Take 2 tablets (1,200 mg total) by mouth 3 (three) times daily. Take 1 tablets three times per day   ibuprofen 800 MG tablet Commonly known as:  ADVIL,MOTRIN Take 1 tablet (800 mg total) by mouth every 8 (eight) hours as needed.   insulin detemir 100 UNIT/ML injection Commonly known as:  LEVEMIR Inject 0.35 mLs (35 Units total) into the skin 2 (two) times daily. Start with 20 units. Increase by 1  unit a day to a maximum of 100 units in the morning as long as morning sugar is over 100.   medroxyPROGESTERone 10 MG tablet Commonly known as:  PROVERA Take 10 mg by mouth daily.   metFORMIN 1000 MG tablet Commonly known as:  GLUCOPHAGE Take 1 tablet (1,000 mg total) by mouth 2 (two) times daily with a meal.   ondansetron 4 MG tablet Commonly known as:  ZOFRAN Take 1 tablet (4 mg total) by mouth every 8 (eight) hours as needed for nausea or vomiting.   oxyCODONE-acetaminophen 5-325 MG tablet Commonly known as:  PERCOCET 1 to 2 tablets every 6 hours as needed for pain.   simvastatin 20 MG tablet Commonly known as:  ZOCOR Take 1 tablet (20 mg total) by mouth daily.             Assessment & Plan:   Recommends CAT scan of  lung w/o contrast in 2 mo apprx 1st week in Aug; rt lung mass per radiaologist suggesstion  F/U in 3 mo w/ labs: Met C, CBC, Lipid, A1C

## 2016-09-18 NOTE — Addendum Note (Signed)
Addended by: Charlie Pitter D on: 09/18/2016 10:41 AM   Modules accepted: Orders

## 2016-09-24 ENCOUNTER — Other Ambulatory Visit: Payer: Self-pay

## 2016-09-24 DIAGNOSIS — E118 Type 2 diabetes mellitus with unspecified complications: Secondary | ICD-10-CM

## 2016-09-24 DIAGNOSIS — Z794 Long term (current) use of insulin: Principal | ICD-10-CM

## 2016-09-24 MED ORDER — METFORMIN HCL 1000 MG PO TABS: 1000.0000 mg | ORAL_TABLET | Freq: Two times a day (BID) | ORAL | 3 refills | Status: DC

## 2016-09-25 ENCOUNTER — Ambulatory Visit: Payer: Self-pay | Admitting: Ophthalmology

## 2016-09-25 ENCOUNTER — Other Ambulatory Visit: Payer: Self-pay

## 2016-09-25 MED ORDER — CYCLOBENZAPRINE HCL 10 MG PO TABS: 10.0000 mg | ORAL_TABLET | Freq: Every day | ORAL | 3 refills | Status: DC

## 2016-10-29 ENCOUNTER — Ambulatory Visit: Payer: Self-pay

## 2016-12-04 ENCOUNTER — Telehealth: Payer: Self-pay | Admitting: Pharmacist

## 2016-12-04 NOTE — Telephone Encounter (Signed)
12/04/16 Printed refill request for Levemir Vials-Inject 20 units under the skin before breakfast, increase by 1 unit each day till BG<130. 12 vials, sending to [facility] for provider to sign.Delos Haring

## 2016-12-17 ENCOUNTER — Ambulatory Visit: Payer: Self-pay

## 2016-12-18 ENCOUNTER — Other Ambulatory Visit: Payer: Self-pay

## 2016-12-19 ENCOUNTER — Other Ambulatory Visit: Payer: Self-pay

## 2016-12-24 ENCOUNTER — Other Ambulatory Visit: Payer: Self-pay

## 2016-12-24 DIAGNOSIS — E119 Type 2 diabetes mellitus without complications: Secondary | ICD-10-CM

## 2016-12-25 ENCOUNTER — Encounter: Payer: Self-pay | Admitting: Internal Medicine

## 2016-12-25 ENCOUNTER — Ambulatory Visit: Payer: Self-pay | Admitting: Internal Medicine

## 2016-12-25 VITALS — BP 114/69 | HR 72 | Temp 98.5°F | Wt 153.0 lb

## 2016-12-25 DIAGNOSIS — E119 Type 2 diabetes mellitus without complications: Secondary | ICD-10-CM

## 2016-12-25 LAB — CBC
HEMATOCRIT: 41.7 % (ref 34.0–46.6)
HEMOGLOBIN: 13.5 g/dL (ref 11.1–15.9)
MCH: 28.2 pg (ref 26.6–33.0)
MCHC: 32.4 g/dL (ref 31.5–35.7)
MCV: 87 fL (ref 79–97)
Platelets: 270 10*3/uL (ref 150–379)
RBC: 4.79 x10E6/uL (ref 3.77–5.28)
RDW: 15.5 % — ABNORMAL HIGH (ref 12.3–15.4)
WBC: 10.3 10*3/uL (ref 3.4–10.8)

## 2016-12-25 LAB — LIPID PANEL
CHOL/HDL RATIO: 4.1 ratio (ref 0.0–4.4)
CHOLESTEROL TOTAL: 120 mg/dL (ref 100–199)
HDL: 29 mg/dL — ABNORMAL LOW (ref >39)
LDL CALC: 72 mg/dL (ref 0–99)
Triglycerides: 97 mg/dL (ref 0–149)
VLDL CHOLESTEROL CAL: 19 mg/dL (ref 5–40)

## 2016-12-25 LAB — COMPREHENSIVE METABOLIC PANEL
ALT: 8 IU/L (ref 0–32)
AST: 11 IU/L (ref 0–40)
Albumin/Globulin Ratio: 2 (ref 1.2–2.2)
Albumin: 4.5 g/dL (ref 3.5–5.5)
Alkaline Phosphatase: 96 IU/L (ref 39–117)
BUN/Creatinine Ratio: 16 (ref 9–23)
BUN: 13 mg/dL (ref 6–24)
Bilirubin Total: <0.2 mg/dL (ref 0.0–1.2)
CALCIUM: 9.8 mg/dL (ref 8.7–10.2)
CO2: 26 mmol/L (ref 20–29)
CREATININE: 0.83 mg/dL (ref 0.57–1.00)
Chloride: 100 mmol/L (ref 96–106)
GFR, EST AFRICAN AMERICAN: 96 mL/min/{1.73_m2} (ref >59)
GFR, EST NON AFRICAN AMERICAN: 83 mL/min/{1.73_m2} (ref >59)
GLUCOSE: 185 mg/dL — AB (ref 65–99)
Globulin, Total: 2.2 g/dL (ref 1.5–4.5)
POTASSIUM: 4.8 mmol/L (ref 3.5–5.2)
Sodium: 144 mmol/L (ref 134–144)
TOTAL PROTEIN: 6.7 g/dL (ref 6.0–8.5)

## 2016-12-25 LAB — HEMOGLOBIN A1C
ESTIMATED AVERAGE GLUCOSE: 177 mg/dL
Hgb A1c MFr Bld: 7.8 % — ABNORMAL HIGH (ref 4.8–5.6)

## 2016-12-25 MED ORDER — DAPAGLIFLOZIN PROPANEDIOL 5 MG PO TABS: 10.0000 mg | ORAL_TABLET | Freq: Every day | ORAL | 3 refills | Status: DC

## 2016-12-25 MED ORDER — GABAPENTIN 600 MG PO TABS: 1200.0000 mg | ORAL_TABLET | Freq: Three times a day (TID) | ORAL | 4 refills | Status: DC

## 2016-12-25 MED ORDER — CYCLOBENZAPRINE HCL 10 MG PO TABS: 10.0000 mg | ORAL_TABLET | Freq: Every day | ORAL | 3 refills | Status: DC

## 2016-12-25 NOTE — Progress Notes (Signed)
Blood sugar 213

## 2016-12-25 NOTE — Progress Notes (Signed)
Subjective:    Patient ID: Brandi Calderon, female    DOB: 09/25/1965, 51 y.o.   MRN: [id number]  HPI  Pt presents with tendinitis in both arms. Her pain is generalized over body with increased physical activity.  She has previously seen an orthopedic physician who diagnosed her with neuropathy.She is continuing to experience pain in her legs and back.        Patient Active Problem List   Diagnosis Date Noted  . Foot pain, right 04/30/2016  . Depression, major, single episode, moderate ([place]) 03/11/2016  . Neuropathy due to type 2 diabetes mellitus ([place]) - severe 12/19/2015  . Menorrhagia 09/28/2015  . Type 2 diabetes mellitus treated with insulin ([place]) 05/24/2015  . Back pain 05/24/2015    Allergies as of 12/25/2016      Reactions   Penicillins Anaphylaxis   Has patient had a PCN reaction causing immediate rash, facial/tongue/throat swelling, SOB or lightheadedness with hypotension: Yes Has patient had a PCN reaction causing severe rash involving mucus membranes or skin necrosis: No Has patient had a PCN reaction that required hospitalization: No Has patient had a PCN reaction occurring within the last 10 years: No If all of the above answers are "NO", then may proceed with Cephalosporin use. Throat swells   Vicodin [hydrocodone-acetaminophen] Hives   Victoza [liraglutide]    Severe nausea      Medication List       Accurate as of 12/25/16  9:08 AM. Always use your most recent med list.          baclofen 10 MG tablet Commonly known as:  LIORESAL Take 1 tablet (10 mg total) by mouth 3 (three) times daily as needed for muscle spasms.   BC HEADACHE POWDER PO Take 4 packets by mouth daily as needed (pain).   citalopram 20 MG tablet Commonly known as:  CELEXA Take 40 mg by mouth 2 (two) times daily.   clonazePAM 0.5 MG tablet Commonly known as:  KLONOPIN Take 0.5 mg by mouth 2 (two) times daily as needed for anxiety (Sometimes takes 3 tablets a day).    cyclobenzaprine 10 MG tablet Commonly known as:  FLEXERIL Take 1 tablet (10 mg total) by mouth daily.   FARXIGA 5 MG Tabs tablet Generic drug:  dapagliflozin propanediol Take 10 mg by mouth daily.   Fish Oil 1000 MG Caps Take 1 capsule (1,000 mg total) by mouth daily. Reported on 09/19/2015   FUROSEMIDE PO Take 2 tablets by mouth daily.   gabapentin 600 MG tablet Commonly known as:  NEURONTIN Take 2 tablets (1,200 mg total) by mouth 3 (three) times daily. Take 1 tablets three times per day   ibuprofen 800 MG tablet Commonly known as:  ADVIL,MOTRIN Take 1 tablet (800 mg total) by mouth every 8 (eight) hours as needed.   insulin detemir 100 UNIT/ML injection Commonly known as:  LEVEMIR Inject 0.35 mLs (35 Units total) into the skin 2 (two) times daily. Start with 20 units. Increase by 1 unit a day to a maximum of 100 units in the morning as long as morning sugar is over 100.   medroxyPROGESTERone 10 MG tablet Commonly known as:  PROVERA Take 1 tablet (10 mg total) by mouth daily.   metFORMIN 1000 MG tablet Commonly known as:  GLUCOPHAGE Take 1 tablet (1,000 mg total) by mouth 2 (two) times daily with a meal.   ondansetron 4 MG tablet Commonly known as:  ZOFRAN Take 1 tablet (4 mg total) by  mouth every 8 (eight) hours as needed for nausea or vomiting.   oxyCODONE-acetaminophen 5-325 MG tablet Commonly known as:  PERCOCET 1 to 2 tablets every 6 hours as needed for pain.   simvastatin 20 MG tablet Commonly known as:  ZOCOR Take 1 tablet (20 mg total) by mouth daily.        Review of Systems     Objective:   Physical Exam  Constitutional: She is oriented to person, place, and time.  Cardiovascular: Normal rate, regular rhythm and normal heart sounds.   Pulmonary/Chest: Effort normal and breath sounds normal.  Neurological: She is alert and oriented to person, place, and time.   BP 114/69   Pulse 72   Temp 98.5 F (36.9 C)   Wt 153 lb (69.4 kg)   LMP  07/20/2015 (Exact Date)   BMI 28.91 kg/m          Assessment & Plan:    Pt will follow-up in 3 months with labs a week prior (MetC, A1c, CBC, sed. Rate)  Refill 3 prescriptions; flexeril, farxiga, neurontin

## 2016-12-27 ENCOUNTER — Telehealth: Payer: Self-pay | Admitting: Pharmacist

## 2016-12-27 NOTE — Telephone Encounter (Signed)
12/27/16 Faxing refill request to [facility] to refill Levemir Vials Max daily dose 100 units-Inject 20 units under the skin before breakfast, increase by 1 unit each day till BG<130. (12 Vials).Delos Haring

## 2017-01-09 ENCOUNTER — Telehealth: Payer: Self-pay

## 2017-01-09 NOTE — Telephone Encounter (Signed)
Received PAP application from [facility] for Farxiga 10 mg placed for provider to sign.

## 2017-01-09 NOTE — Telephone Encounter (Signed)
Placed signed application/script in [facility] folder for pickup. 

## 2017-01-09 NOTE — Telephone Encounter (Signed)
Called pt about endo appt on 11/20. Pt verbalized understanding.

## 2017-01-14 ENCOUNTER — Telehealth: Payer: Self-pay | Admitting: Adult Health Nurse Practitioner

## 2017-01-14 NOTE — Telephone Encounter (Signed)
Wants to see and make an appointment with "diabetic doctor" (endocrine?)

## 2017-01-27 ENCOUNTER — Telehealth: Payer: Self-pay | Admitting: Pharmacist

## 2017-01-27 NOTE — Telephone Encounter (Signed)
01/27/17 Faxed script to [place] for Farxiga 10 mg Take one tablet by mouth every day DOSE CHANGE.Delos Haring

## 2017-02-18 ENCOUNTER — Ambulatory Visit: Payer: Self-pay | Admitting: Endocrinology

## 2017-02-18 VITALS — Temp 97.9°F | Wt 146.0 lb

## 2017-02-18 DIAGNOSIS — E119 Type 2 diabetes mellitus without complications: Secondary | ICD-10-CM

## 2017-02-18 DIAGNOSIS — Z794 Long term (current) use of insulin: Principal | ICD-10-CM

## 2017-02-18 LAB — POCT GLYCOSYLATED HEMOGLOBIN (HGB A1C): HEMOGLOBIN A1C: 8.2

## 2017-02-18 MED ORDER — FUROSEMIDE 40 MG PO TABS: 40.0000 mg | ORAL_TABLET | Freq: Every day | ORAL | 4 refills | Status: DC

## 2017-02-18 NOTE — Patient Instructions (Addendum)
Ask primary doctor about obstructive sleep apena.  Increase or decrease insulin to try to keep AM sugar about 100 mg/dl (80-130) Occasionally check at other times to make sure it is good all day.

## 2017-02-18 NOTE — Progress Notes (Addendum)
Agree with plan documented. Hale Bogus, MD, PhD  Brandi Calderon is a 51 y.o. female with [id number] complicated by cardiovascular risk factors, ast seen at the [facility] on 6/19.  Assessment and Plan:   See Dr. Sherri Sear note  Subjective:    Other problems include:  Patient Active Problem List   Diagnosis Date Noted   Foot pain, right 04/30/2016   Depression, major, single episode, moderate ([place]) 03/11/2016   Neuropathy due to type 2 diabetes mellitus ([place]) - severe 12/19/2015   Menorrhagia 09/28/2015   Type 2 diabetes mellitus treated with insulin ([place]) 05/24/2015   Back pain 05/24/2015    Her current medications include: Current Outpatient Medications  Medication Sig Dispense Refill   Aspirin-Salicylamide-Caffeine (BC HEADACHE POWDER PO) Take 4 packets by mouth daily as needed (pain).     baclofen (LIORESAL) 10 MG tablet Take 1 tablet (10 mg total) by mouth 3 (three) times daily as needed for muscle spasms. 30 tablet 0   citalopram (CELEXA) 20 MG tablet Take 40 mg by mouth 2 (two) times daily.      clonazePAM (KLONOPIN) 0.5 MG tablet Take 0.5 mg by mouth 2 (two) times daily as needed for anxiety (Sometimes takes 3 tablets a day).      cyclobenzaprine (FLEXERIL) 10 MG tablet Take 1 tablet (10 mg total) by mouth daily. 90 tablet 3   dapagliflozin propanediol (FARXIGA) 5 MG TABS tablet Take 10 mg by mouth daily. 180 tablet 3   gabapentin (NEURONTIN) 600 MG tablet Take 2 tablets (1,200 mg total) by mouth 3 (three) times daily. Take 1 tablets three times per day 540 tablet 4   ibuprofen (ADVIL,MOTRIN) 800 MG tablet Take 1 tablet (800 mg total) by mouth every 8 (eight) hours as needed. 100 tablet 0   medroxyPROGESTERone (PROVERA) 10 MG tablet Take 1 tablet (10 mg total) by mouth daily. 30 tablet 11   metFORMIN (GLUCOPHAGE) 1000 MG tablet Take 1 tablet (1,000 mg total) by mouth 2 (two) times daily with a meal. 180 tablet 3   Omega-3 Fatty Acids (FISH OIL) 1000 MG CAPS Take 1 capsule  (1,000 mg total) by mouth daily. Reported on 09/19/2015 90 capsule 3   ondansetron (ZOFRAN) 4 MG tablet Take 1 tablet (4 mg total) by mouth every 8 (eight) hours as needed for nausea or vomiting. 12 tablet 0   oxyCODONE-acetaminophen (PERCOCET) 5-325 MG per tablet 1 to 2 tablets every 6 hours as needed for pain. 20 tablet 0   simvastatin (ZOCOR) 20 MG tablet Take 1 tablet (20 mg total) by mouth daily. 90 tablet 3   furosemide (LASIX) 40 MG tablet Take 1 tablet (40 mg total) by mouth daily. 90 tablet 4   insulin detemir (LEVEMIR) 100 UNIT/ML injection Inject 0.35 mLs (35 Units total) into the skin 2 (two) times daily. Start with 20 units. Increase by 1 unit a day to a maximum of 100 units in the morning as long as morning sugar is over 100. (Patient not taking: Reported on 02/18/2017) 90 mL 4   No current facility-administered medications for this visit.     Interim history:  Pt shares that she recently got divorced but she is in good spirits.  SOB - pt reports recently waking up gasping for breath. She is a current smoker, 3 packs/day. Pt reports she snores a lot for 3+ years. Pt reports sometimes during the day she has a coughing attack but does not feel like SOB.  Diet - pt reports she has  decreased bread consumption but no other changes.  Exercise - pt reports no exercise  Diabetes - Pt is taking 1000mg  of Metformin. Pt reports she has not been taking the full 100 units insulin b/c her glucose has been running low - she took 20 units today. Pt checks glucose once and it typically runs at 100 in the morning. Pt has been taking Furosemide 1 daily but needs a new Rx for BID.  A1c is up to 8.2 today from 7.8 on last visit. Pt reports neuropathy in R foot and a nodule. Pt reports trouble swallowing sometimes.  L hand - pt reports she can not use it b/c of pain in thumb for a week.  L cheek - pt complains of nodule for a month that is painful. Pt reports she wears dentures and hasn't noticed any  differences in her gums.    Exam:  Temp 97.9 F (36.6 C)    Wt 146 lb (66.2 kg)    LMP 07/20/2015 (Exact Date)    BMI 27.59 kg/m  Constitutional:  Alert, oriented, in NAD HENT: tender mass on L lower jawline. Approx 1in diameter. Eyes: EOMI Cardiovascular: Regular rhythm, no murmurs, gallops, rubs.  Respiratory: Lungs clear, no wheezes or rales Gastrointestinal: abdomen non tender, no HSM, no masses Skin: no rashes or lesions. Feet without lesions Neurologic: No Tremor:  light touch sensation 0/4 spots on R foot and 2/4 spots on L foot by monofilament Psychiatric: Oriented, normal mood and affect Heme/lymph/immunologic: no lymphadenopathy   Recent labs:  Results for orders placed or performed in visit on 02/18/17  POCT HgB A1C  Result Value Ref Range   Hemoglobin A1C 8.2

## 2017-02-18 NOTE — Progress Notes (Signed)
A1C 8.2 AM glucose 100ish Only taking insulin 20 units twice a day (was prescribed 100 bid). Reduced smoking from 4 packs a day to 3 packs a day Snoring and gasping.  Wonders about OSA.

## 2017-02-19 ENCOUNTER — Telehealth: Payer: Self-pay

## 2017-02-19 NOTE — Telephone Encounter (Signed)
Received PAP application from [facility] for [place] placed for provider to sign.

## 2017-02-19 NOTE — Telephone Encounter (Signed)
Placed signed application/script in [facility] folder for pickup. 

## 2017-03-04 ENCOUNTER — Emergency Department: Payer: PRIVATE HEALTH INSURANCE

## 2017-03-04 ENCOUNTER — Emergency Department
Admission: EM | Admit: 2017-03-04 | Discharge: 2017-03-04 | Disposition: A | Discharge status: Home / Self Care | Payer: PRIVATE HEALTH INSURANCE | Attending: Emergency Medicine | Admitting: Emergency Medicine

## 2017-03-04 ENCOUNTER — Encounter: Payer: Self-pay | Admitting: Physician Assistant

## 2017-03-04 ENCOUNTER — Other Ambulatory Visit: Payer: Self-pay

## 2017-03-04 DIAGNOSIS — F17220 Nicotine dependence, chewing tobacco, uncomplicated: Secondary | ICD-10-CM | POA: Insufficient documentation

## 2017-03-04 DIAGNOSIS — I10 Essential (primary) hypertension: Secondary | ICD-10-CM | POA: Insufficient documentation

## 2017-03-04 DIAGNOSIS — E119 Type 2 diabetes mellitus without complications: Secondary | ICD-10-CM | POA: Insufficient documentation

## 2017-03-04 DIAGNOSIS — W19XXXA Unspecified fall, initial encounter: Secondary | ICD-10-CM

## 2017-03-04 DIAGNOSIS — M25562 Pain in left knee: Secondary | ICD-10-CM | POA: Insufficient documentation

## 2017-03-04 DIAGNOSIS — Z79899 Other long term (current) drug therapy: Secondary | ICD-10-CM | POA: Insufficient documentation

## 2017-03-04 DIAGNOSIS — Z7984 Long term (current) use of oral hypoglycemic drugs: Secondary | ICD-10-CM | POA: Insufficient documentation

## 2017-03-04 DIAGNOSIS — M79642 Pain in left hand: Secondary | ICD-10-CM | POA: Insufficient documentation

## 2017-03-04 DIAGNOSIS — F17210 Nicotine dependence, cigarettes, uncomplicated: Secondary | ICD-10-CM | POA: Insufficient documentation

## 2017-03-04 MED ORDER — TRAMADOL HCL 50 MG PO TABS: 50.0000 mg | ORAL_TABLET | Freq: Four times a day (QID) | ORAL | 0 refills | Status: DC | PRN

## 2017-03-04 NOTE — Discharge Instructions (Signed)
[facility] if you are not better, use ice, tylenol and ibuprofen for pain, tramadol for pain not relieved by tylenol or ibuprofen

## 2017-03-04 NOTE — ED Triage Notes (Signed)
Pt arrived via POV today d/t knee pain and back pain from a fall yesterday. Pt states she was walking down the hall and just fell forward. Pt denies any dizziness at the time of the fall. Pt has a knot below the knee and rates her pain at a 10 on a 1-10 scale.

## 2017-03-04 NOTE — ED Provider Notes (Signed)
[facility] Emergency Department Provider Note  ____________________________________________   First MD Initiated Contact with Patient 03/04/17 1006     (approximate)  I have reviewed the triage vital signs and the nursing notes.   HISTORY  Chief Complaint Knee Pain and Back Pain    HPI Brandi Calderon is a 51 y.o. female complained of left knee pain and low back pain after falling at [facility] yesterday, she states she was walking down the hallway and just fell on her face, she did not trip on anything, the floor was not wet, she just felt, he is complaining of left hand pain also, this is more chronic than acute, she has trouble gripping things, denies numbness or tingling   Past Medical History:  Diagnosis Date  . Arthritis   . Back pain   . Depression   . Diabetes mellitus   . Headache   . Hypertension   . Tendonitis     Patient Active Problem List   Diagnosis Date Noted  . Foot pain, right 04/30/2016  . Depression, major, single episode, moderate ([place]) 03/11/2016  . Neuropathy due to type 2 diabetes mellitus ([place]) - severe 12/19/2015  . Menorrhagia 09/28/2015  . Type 2 diabetes mellitus treated with insulin ([place]) 05/24/2015  . Back pain 05/24/2015    Past Surgical History:  Procedure Laterality Date  . CESAREAN SECTION    . CESAREAN SECTION      Prior to Admission medications   Medication Sig Start Date End Date Taking? Authorizing Provider  Aspirin-Salicylamide-Caffeine (BC HEADACHE POWDER PO) Take 4 packets by mouth daily as needed (pain).    [provider]  baclofen (LIORESAL) 10 MG tablet Take 1 tablet (10 mg total) by mouth 3 (three) times daily as needed for muscle spasms. 12/23/15   Hagler, Jami L, PA-C  citalopram (CELEXA) 20 MG tablet Take 40 mg by mouth 2 (two) times daily.     [provider]  clonazePAM (KLONOPIN) 0.5 MG tablet Take 0.5 mg by mouth 2 (two) times daily as needed for anxiety  (Sometimes takes 3 tablets a day).     [provider]  cyclobenzaprine (FLEXERIL) 10 MG tablet Take 1 tablet (10 mg total) by mouth daily. 12/25/16   Tawni Millers, MD  dapagliflozin propanediol (FARXIGA) 5 MG TABS tablet Take 10 mg by mouth daily. 12/25/16   Tawni Millers, MD  furosemide (LASIX) 40 MG tablet Take 1 tablet (40 mg total) by mouth daily. 02/18/17   Barnet Pall, MD  gabapentin (NEURONTIN) 600 MG tablet Take 2 tablets (1,200 mg total) by mouth 3 (three) times daily. Take 1 tablets three times per day 12/25/16   Tawni Millers, MD  ibuprofen (ADVIL,MOTRIN) 800 MG tablet Take 1 tablet (800 mg total) by mouth every 8 (eight) hours as needed. 02/13/16   Birdie Sons, MD  medroxyPROGESTERone (PROVERA) 10 MG tablet Take 1 tablet (10 mg total) by mouth daily. 09/18/16   Tawni Millers, MD  metFORMIN (GLUCOPHAGE) 1000 MG tablet Take 1 tablet (1,000 mg total) by mouth 2 (two) times daily with a meal. 09/24/16   Doles-Johnson, Teah, NP  Omega-3 Fatty Acids (FISH OIL) 1000 MG CAPS Take 1 capsule (1,000 mg total) by mouth daily. Reported on 09/19/2015 06/26/16   Tawni Millers, MD  ondansetron (ZOFRAN) 4 MG tablet Take 1 tablet (4 mg total) by mouth every 8 (eight) hours as needed for nausea or vomiting. 09/05/16   Tegeler,  Gwenyth Allegra, MD  oxyCODONE-acetaminophen (PERCOCET) 5-325 MG per tablet 1 to 2 tablets every 6 hours as needed for pain. 11/27/13   Harden Mo, MD  simvastatin (ZOCOR) 20 MG tablet Take 1 tablet (20 mg total) by mouth daily. 06/18/16   Bertram Gala, PA  traMADol (ULTRAM) 50 MG tablet Take 1 tablet (50 mg total) by mouth every 6 (six) hours as needed. 03/04/17   Versie Starks, PA-C    Allergies Penicillins; Vicodin [hydrocodone-acetaminophen]; and Victoza [liraglutide]  Family History  Problem Relation Age of Onset  . Kidney disease Father   . Diabetes Father   . Dementia Father   . Diabetes Sister   . Diabetes Brother     Social History Social  History   Tobacco Use  . Smoking status: Current Every Day Smoker    Packs/day: 4.50    Years: 33.00    Pack years: 148.50    Types: Cigarettes  . Smokeless tobacco: Current User  Substance Use Topics  . Alcohol use: No  . Drug use: No    Review of Systems  Constitutional: No fever/chills Eyes: No visual changes. ENT: No sore throat. Respiratory: Denies cough Genitourinary: Negative for dysuria. Musculoskeletal: Positive for chronic back pain. Positive for left knee and hand pain Skin: Negative for rash.    ____________________________________________   PHYSICAL EXAM:  VITAL SIGNS: ED Triage Vitals  Enc Vitals Group     BP 03/04/17 0930 122/65     Pulse Rate 03/04/17 0930 67     Resp 03/04/17 0930 14     Temp 03/04/17 0930 97.7 F (36.5 C)     Temp Source 03/04/17 0930 Oral     SpO2 03/04/17 0930 98 %     Weight 03/04/17 0931 146 lb (66.2 kg)     Height 03/04/17 0931 5\' 1"  (1.549 m)     Head Circumference --      Peak Flow --      Pain Score 03/04/17 0930 10     Pain Loc --      Pain Edu? --      Excl. in [place]? --     Constitutional: Alert and oriented. Well appearing and in no acute distress. Eyes: Conjunctivae are normal.  Head: Atraumatic. Nose: No congestion/rhinnorhea. Mouth/Throat: Mucous membranes are moist.   Cardiovascular: Normal rate, regular rhythm. Respiratory: Normal respiratory effort.  No retractions GU: deferred Musculoskeletal: FROM all extremities, warm and well perfused, left knee has a small raised area on the tibial tuberosity, the area is tender to palpation, the left hand is a little swollen, no redness noted, the area is a little tender, she is able to grip my hands, neurovascular is intact, patient is able to ambulate on her own Neurologic:  Normal speech and language.  Skin:  Skin is warm, dry and intact. No rash noted. Psychiatric: Mood and affect are normal. Speech and behavior are  normal.  ____________________________________________   LABS (all labs ordered are listed, but only abnormal results are displayed)  Labs Reviewed - No data to display ____________________________________________   ____________________________________________  RADIOLOGY  X-ray of the left knee and left hand are negative  ____________________________________________   PROCEDURES  Procedure(s) performed: No      ____________________________________________   INITIAL IMPRESSION / ASSESSMENT AND PLAN / ED COURSE  Pertinent labs & imaging results that were available during my care of the patient were reviewed by me and considered in my medical decision making (see chart for details).  Patient is 51 year old female with chronic back pain who fell at [facility] yesterday she had a contusion to the left knee and is arthritis in the left hand, patient was instructed to follow-up with her doctor or orthopedics if she is not better in 5-7 days a prescription for tramadol 50 mg #20 was given to the patient, on the [place] controlled substance database the patient has not had any recent narcotics      ____________________________________________   FINAL CLINICAL IMPRESSION(S) / ED DIAGNOSES  Final diagnoses:  Acute pain of left knee  Fall, initial encounter  Hand pain, left      NEW MEDICATIONS STARTED DURING THIS VISIT:  This SmartLink is deprecated. Use AVSMEDLIST instead to display the medication list for a patient.   Note:  This document was prepared using Dragon voice recognition software and may include unintentional dictation errors.    Versie Starks, PA-C 03/04/17 1539    Earleen Newport, MD 03/04/17 [phone]

## 2017-03-19 ENCOUNTER — Other Ambulatory Visit: Payer: Self-pay

## 2017-03-19 ENCOUNTER — Ambulatory Visit: Payer: Self-pay | Admitting: Internal Medicine

## 2017-03-19 DIAGNOSIS — E119 Type 2 diabetes mellitus without complications: Secondary | ICD-10-CM

## 2017-03-20 LAB — CBC
HEMATOCRIT: 39.7 % (ref 34.0–46.6)
Hemoglobin: 13 g/dL (ref 11.1–15.9)
MCH: 28 pg (ref 26.6–33.0)
MCHC: 32.7 g/dL (ref 31.5–35.7)
MCV: 86 fL (ref 79–97)
PLATELETS: 250 10*3/uL (ref 150–379)
RBC: 4.64 x10E6/uL (ref 3.77–5.28)
RDW: 16.1 % — AB (ref 12.3–15.4)
WBC: 10.2 10*3/uL (ref 3.4–10.8)

## 2017-03-20 LAB — COMPREHENSIVE METABOLIC PANEL
ALK PHOS: 106 IU/L (ref 39–117)
ALT: 10 IU/L (ref 0–32)
AST: 9 IU/L (ref 0–40)
Albumin/Globulin Ratio: 1.9 (ref 1.2–2.2)
Albumin: 4.3 g/dL (ref 3.5–5.5)
BILIRUBIN TOTAL: 0.3 mg/dL (ref 0.0–1.2)
BUN/Creatinine Ratio: 23 (ref 9–23)
BUN: 15 mg/dL (ref 6–24)
CHLORIDE: 100 mmol/L (ref 96–106)
CO2: 29 mmol/L (ref 20–29)
CREATININE: 0.65 mg/dL (ref 0.57–1.00)
Calcium: 9.6 mg/dL (ref 8.7–10.2)
GFR calc non Af Amer: 105 mL/min/{1.73_m2} (ref >59)
GFR, EST AFRICAN AMERICAN: 121 mL/min/{1.73_m2} (ref >59)
GLUCOSE: 281 mg/dL — AB (ref 65–99)
Globulin, Total: 2.3 g/dL (ref 1.5–4.5)
Potassium: 5.1 mmol/L (ref 3.5–5.2)
Sodium: 141 mmol/L (ref 134–144)
TOTAL PROTEIN: 6.6 g/dL (ref 6.0–8.5)

## 2017-03-20 LAB — HEMOGLOBIN A1C
Est. average glucose Bld gHb Est-mCnc: 197 mg/dL
HEMOGLOBIN A1C: 8.5 % — AB (ref 4.8–5.6)

## 2017-03-20 LAB — SEDIMENTATION RATE: SED RATE: 15 mm/h (ref 0–32)

## 2017-03-26 ENCOUNTER — Ambulatory Visit: Payer: Self-pay | Admitting: Internal Medicine

## 2017-04-02 ENCOUNTER — Ambulatory Visit: Payer: Self-pay | Admitting: Internal Medicine

## 2017-04-02 ENCOUNTER — Encounter: Payer: Self-pay | Admitting: Internal Medicine

## 2017-04-02 VITALS — BP 106/63 | HR 70 | Temp 97.5°F | Wt 150.0 lb

## 2017-04-02 DIAGNOSIS — E119 Type 2 diabetes mellitus without complications: Secondary | ICD-10-CM

## 2017-04-02 LAB — POCT GLUCOSE (DEVICE FOR HOME USE): GLUCOSE FASTING, POC: 220 mg/dL — AB (ref 70–99)

## 2017-04-02 NOTE — Progress Notes (Signed)
Subjective:    Patient ID: Brandi Calderon, female    DOB: 1965-04-03, 52 y.o.   MRN: [id number]  HPI   Pt injured her knee about a month ago and is still experiencing pain. She states that this is still falling a lot on her left side.  Pt states that she checks her sugar levels in the morning.  Allergies as of 04/02/2017      Reactions   Penicillins Anaphylaxis   Has patient had a PCN reaction causing immediate rash, facial/tongue/throat swelling, SOB or lightheadedness with hypotension: Yes Has patient had a PCN reaction causing severe rash involving mucus membranes or skin necrosis: No Has patient had a PCN reaction that required hospitalization: No Has patient had a PCN reaction occurring within the last 10 years: No If all of the above answers are "NO", then may proceed with Cephalosporin use. Throat swells   Vicodin [hydrocodone-acetaminophen] Hives   Victoza [liraglutide]    Severe nausea      Medication List        Accurate as of 04/02/17  9:26 AM. Always use your most recent med list.          baclofen 10 MG tablet Commonly known as:  LIORESAL Take 1 tablet (10 mg total) by mouth 3 (three) times daily as needed for muscle spasms.   BC HEADACHE POWDER PO Take 4 packets by mouth daily as needed (pain).   citalopram 20 MG tablet Commonly known as:  CELEXA Take 40 mg by mouth 2 (two) times daily.   clonazePAM 0.5 MG tablet Commonly known as:  KLONOPIN Take 0.5 mg by mouth 2 (two) times daily as needed for anxiety (Sometimes takes 3 tablets a day).   cyclobenzaprine 10 MG tablet Commonly known as:  FLEXERIL Take 1 tablet (10 mg total) by mouth daily.   dapagliflozin propanediol 5 MG Tabs tablet Commonly known as:  FARXIGA Take 10 mg by mouth daily.   Fish Oil 1000 MG Caps Take 1 capsule (1,000 mg total) by mouth daily. Reported on 09/19/2015   furosemide 40 MG tablet Commonly known as:  LASIX Take 1 tablet (40 mg total) by mouth daily.   gabapentin 600  MG tablet Commonly known as:  NEURONTIN Take 2 tablets (1,200 mg total) by mouth 3 (three) times daily. Take 1 tablets three times per day   ibuprofen 800 MG tablet Commonly known as:  ADVIL,MOTRIN Take 1 tablet (800 mg total) by mouth every 8 (eight) hours as needed.   medroxyPROGESTERone 10 MG tablet Commonly known as:  PROVERA Take 1 tablet (10 mg total) by mouth daily.   metFORMIN 1000 MG tablet Commonly known as:  GLUCOPHAGE Take 1 tablet (1,000 mg total) by mouth 2 (two) times daily with a meal.   ondansetron 4 MG tablet Commonly known as:  ZOFRAN Take 1 tablet (4 mg total) by mouth every 8 (eight) hours as needed for nausea or vomiting.   simvastatin 20 MG tablet Commonly known as:  ZOCOR Take 1 tablet (20 mg total) by mouth daily.      Patient Active Problem List   Diagnosis Date Noted  . Foot pain, right 04/30/2016  . Depression, major, single episode, moderate ([place]) 03/11/2016  . Neuropathy due to type 2 diabetes mellitus ([place]) - severe 12/19/2015  . Menorrhagia 09/28/2015  . Type 2 diabetes mellitus treated with insulin ([place]) 05/24/2015  . Back pain 05/24/2015    Review of Systems        Objective:  Physical Exam  Constitutional: She is oriented to person, place, and time.  Cardiovascular: Normal rate, regular rhythm and normal heart sounds.  Pulmonary/Chest: Effort normal and breath sounds normal.  Neurological: She is alert and oriented to person, place, and time.   BP 106/63   Pulse 70   Temp (!) 97.5 F (36.4 C) (Oral)   Wt 150 lb (68 kg)   LMP 07/20/2015 (Exact Date)   BMI 28.34 kg/m       Assessment & Plan:   Referral to neurology department at [facility] for sleep apnea evaluation and left side weakness  Advised patient to bring all of her medications with her to her next appointment with endocrinology in Feb. to review  Follow up in 3 months with labs a week prior (CMET, CBC, A1c, micro albumin, uric acid, B12 level)

## 2017-04-03 ENCOUNTER — Telehealth: Payer: Self-pay | Admitting: Pharmacist

## 2017-04-03 NOTE — Telephone Encounter (Signed)
12/03/68 Faxing [place] application for renewal-Farxiga 10mg  Take one tablet by mouth everyday.Delos Haring

## 2017-04-11 ENCOUNTER — Telehealth: Payer: Self-pay | Admitting: Pharmacist

## 2017-04-11 NOTE — Telephone Encounter (Signed)
04/11/17 Faxed [facility] refill request for Levemir Vials Inject 20 units under the skin before breakfast, increase by 1 unit each day till BG < 130. #2 (Max daily dose 100 units).Delos Haring

## 2017-04-18 ENCOUNTER — Other Ambulatory Visit: Payer: Self-pay | Admitting: Internal Medicine

## 2017-05-13 ENCOUNTER — Other Ambulatory Visit: Payer: Self-pay

## 2017-05-13 ENCOUNTER — Emergency Department (HOSPITAL_COMMUNITY): Payer: Self-pay

## 2017-05-13 ENCOUNTER — Emergency Department (HOSPITAL_COMMUNITY)
Admission: EM | Admit: 2017-05-13 | Discharge: 2017-05-13 | Disposition: A | Discharge status: Home / Self Care | Payer: Self-pay | Attending: Emergency Medicine | Admitting: Emergency Medicine

## 2017-05-13 ENCOUNTER — Encounter (HOSPITAL_COMMUNITY): Payer: Self-pay | Admitting: Emergency Medicine

## 2017-05-13 DIAGNOSIS — Z794 Long term (current) use of insulin: Secondary | ICD-10-CM | POA: Insufficient documentation

## 2017-05-13 DIAGNOSIS — S8001XA Contusion of right knee, initial encounter: Secondary | ICD-10-CM | POA: Insufficient documentation

## 2017-05-13 DIAGNOSIS — I10 Essential (primary) hypertension: Secondary | ICD-10-CM | POA: Insufficient documentation

## 2017-05-13 DIAGNOSIS — W19XXXA Unspecified fall, initial encounter: Secondary | ICD-10-CM | POA: Insufficient documentation

## 2017-05-13 DIAGNOSIS — E1140 Type 2 diabetes mellitus with diabetic neuropathy, unspecified: Secondary | ICD-10-CM | POA: Insufficient documentation

## 2017-05-13 DIAGNOSIS — Z880 Allergy status to penicillin: Secondary | ICD-10-CM | POA: Insufficient documentation

## 2017-05-13 DIAGNOSIS — Z885 Allergy status to narcotic agent status: Secondary | ICD-10-CM | POA: Insufficient documentation

## 2017-05-13 DIAGNOSIS — Y939 Activity, unspecified: Secondary | ICD-10-CM | POA: Insufficient documentation

## 2017-05-13 DIAGNOSIS — M25561 Pain in right knee: Secondary | ICD-10-CM | POA: Insufficient documentation

## 2017-05-13 DIAGNOSIS — F17210 Nicotine dependence, cigarettes, uncomplicated: Secondary | ICD-10-CM | POA: Insufficient documentation

## 2017-05-13 DIAGNOSIS — Z79899 Other long term (current) drug therapy: Secondary | ICD-10-CM | POA: Insufficient documentation

## 2017-05-13 DIAGNOSIS — Y929 Unspecified place or not applicable: Secondary | ICD-10-CM | POA: Insufficient documentation

## 2017-05-13 DIAGNOSIS — Y999 Unspecified external cause status: Secondary | ICD-10-CM | POA: Insufficient documentation

## 2017-05-13 NOTE — ED Provider Notes (Signed)
[facility] EMERGENCY DEPARTMENT Provider Note   CSN: [id number] Arrival date & time: 05/13/17  1246     History   Chief Complaint Chief Complaint  Patient presents with  . Knee Pain    HPI Brandi Calderon is a 52 y.o. female presenting for evaluation of right knee pain.  Patient states that she fell a month ago, landing on both knees.  She was evaluated for left knee pain with x-rays, there is no fracture dislocation.  Patient takes gabapentin, ibuprofen, and flexeril.  Last night, she fell again, landing on her right knee.  She is here for right knee evaluation.  She states her primary care doctor is investigating the cause of her frequent falls.  She denies pain in her hip or her feet.  She denies numbness or tingling.  She is able to ambulate.  Pain is present with palpation.  Nothing makes it better.  No pain at rest. She is not on blood thinners.  HPI  Past Medical History:  Diagnosis Date  . Arthritis   . Back pain   . Depression   . Diabetes mellitus   . Headache   . Hypertension   . Tendonitis     Patient Active Problem List   Diagnosis Date Noted  . Foot pain, right 04/30/2016  . Depression, major, single episode, moderate ([place]) 03/11/2016  . Neuropathy due to type 2 diabetes mellitus ([place]) - severe 12/19/2015  . Menorrhagia 09/28/2015  . Type 2 diabetes mellitus treated with insulin ([place]) 05/24/2015  . Back pain 05/24/2015    Past Surgical History:  Procedure Laterality Date  . CESAREAN SECTION    . CESAREAN SECTION      OB History    No data available       Home Medications    Prior to Admission medications   Medication Sig Start Date End Date Taking? Authorizing Provider  Aspirin-Salicylamide-Caffeine (BC HEADACHE POWDER PO) Take 4 packets by mouth daily as needed (pain).    [provider]  baclofen (LIORESAL) 10 MG tablet Take 1 tablet (10 mg total) by mouth 3 (three) times daily as needed for muscle spasms.  12/23/15   Hagler, Jami L, PA-C  citalopram (CELEXA) 20 MG tablet Take 40 mg by mouth 2 (two) times daily.     [provider]  clonazePAM (KLONOPIN) 0.5 MG tablet Take 0.5 mg by mouth 2 (two) times daily as needed for anxiety (Sometimes takes 3 tablets a day).     [provider]  cyclobenzaprine (FLEXERIL) 10 MG tablet Take 1 tablet (10 mg total) by mouth daily. 12/25/16   Tawni Millers, MD  dapagliflozin propanediol (FARXIGA) 5 MG TABS tablet Take 10 mg by mouth daily. 12/25/16   Tawni Millers, MD  furosemide (LASIX) 40 MG tablet Take 1 tablet (40 mg total) by mouth daily. 02/18/17   Barnet Pall, MD  gabapentin (NEURONTIN) 600 MG tablet TAKE TWO TABLETS (1200 MG) BY MOUTH 3 TIMES A DAY 04/21/17   Tawni Millers, MD  ibuprofen (ADVIL,MOTRIN) 800 MG tablet Take 1 tablet (800 mg total) by mouth every 8 (eight) hours as needed. 02/13/16   Birdie Sons, MD  medroxyPROGESTERone (PROVERA) 10 MG tablet Take 1 tablet (10 mg total) by mouth daily. 09/18/16   Tawni Millers, MD  metFORMIN (GLUCOPHAGE) 1000 MG tablet Take 1 tablet (1,000 mg total) by mouth 2 (two) times daily with a meal. 09/24/16   Doles-Johnson, Leonarda Salon, NP  Omega-3 Fatty Acids (FISH OIL) 1000 MG CAPS Take 1 capsule (1,000 mg total) by mouth daily. Reported on 09/19/2015 06/26/16   Tawni Millers, MD  ondansetron (ZOFRAN) 4 MG tablet Take 1 tablet (4 mg total) by mouth every 8 (eight) hours as needed for nausea or vomiting. 09/05/16   Tegeler, Gwenyth Allegra, MD  simvastatin (ZOCOR) 20 MG tablet Take 1 tablet (20 mg total) by mouth daily. 06/18/16   Bertram Gala, PA    Family History Family History  Problem Relation Age of Onset  . Kidney disease Father   . Diabetes Father   . Dementia Father   . Diabetes Sister   . Diabetes Brother     Social History Social History   Tobacco Use  . Smoking status: Current Every Day Smoker    Packs/day: 4.50    Years: 33.00    Pack years: 148.50    Types: Cigarettes  .  Smokeless tobacco: Current User  Substance Use Topics  . Alcohol use: No  . Drug use: No     Allergies   Penicillins; Vicodin [hydrocodone-acetaminophen]; and Victoza [liraglutide]   Review of Systems Review of Systems  Musculoskeletal: Positive for arthralgias.  Neurological: Negative for numbness.  Hematological: Does not bruise/bleed easily.     Physical Exam Updated Vital Signs BP 116/79 (BP Location: Right Arm)   Pulse 65   Temp 98 F (36.7 C) (Oral)   Resp 16   LMP 07/20/2015 (Exact Date)   SpO2 99%   Physical Exam  Constitutional: She is oriented to person, place, and time. She appears well-developed and well-nourished. No distress.  HENT:  Head: Normocephalic and atraumatic.  Eyes: EOM are normal.  Neck: Normal range of motion.  Pulmonary/Chest: Effort normal.  Abdominal: She exhibits no distension.  Musculoskeletal: Normal range of motion. She exhibits tenderness.  No obvious swelling of the right knee.  Mild contusion at the tibial tuberosity with TTP of this contusion. No TTP elsewhere on the knee.  Full active range of motion of the knee without difficulty.  No pain with varus or valgus stress.  Popliteal pulses intact bilaterally.  Sensation intact bilaterally.  Color and warmth equal bilaterally.  Neurological: She is alert and oriented to person, place, and time.  Skin: Skin is warm. No rash noted.  Psychiatric: She has a normal mood and affect.  Nursing note and vitals reviewed.    ED Treatments / Results  Labs (all labs ordered are listed, but only abnormal results are displayed) Labs Reviewed - No data to display  EKG  EKG Interpretation None       Radiology Dg Knee Complete 4 Views Right  Result Date: 05/13/2017 CLINICAL DATA:  52 year old female with a history of fall and knee pain EXAM: RIGHT KNEE - COMPLETE 4+ VIEW COMPARISON:  None. FINDINGS: No evidence of fracture, dislocation, or joint effusion. No evidence of arthropathy or other  focal bone abnormality. Soft tissues are unremarkable. IMPRESSION: Negative for acute bony abnormality Electronically Signed   By: Corrie Mckusick D.O.   On: 05/13/2017 14:36    Procedures Procedures (including critical care time)  Medications Ordered in ED Medications - No data to display   Initial Impression / Assessment and Plan / ED Course  I have reviewed the triage vital signs and the nursing notes.  Pertinent labs & imaging results that were available during my care of the patient were reviewed by me and considered in my medical decision making (see chart for details).  Patient presenting for evaluation of right knee pain.  Physical exam reassuring, she is neurovascularly intact.  Contusion noted on the anterior knee with tenderness over this area.  No tenderness elsewhere.  No swelling.  Will obtain x-ray for further evaluation per patient request.  I personally reviewed and interpreted the x-ray, which showed no sign of fracture or dislocation.  Discussed findings with patient.  Discussed likely contusion as cause of pain.  Offered Voltaren gel, patient declined.  Discussed patient should continue taking her normal medications, rest, ice, and elevate her leg.  At this time, patient appears safe for discharge.  Return precautions given.  Patient states she understands and agrees to plan.   Final Clinical Impressions(s) / ED Diagnoses   Final diagnoses:  Acute pain of right knee    ED Discharge Orders    None       Franchot Heidelberg, PA-C 05/13/17 1504    Davonna Belling, MD 05/13/17 1555

## 2017-05-13 NOTE — ED Triage Notes (Signed)
Pt states one month ago she fell onto both knees and have been sore but last night she tripped and re injured her left knee. Pt has pain in both knee, pt is ambulatory. Pt denies any other injury no LOC with falls.

## 2017-05-13 NOTE — ED Notes (Signed)
Patient returned from xray.

## 2017-05-13 NOTE — ED Notes (Signed)
Patient ambulated to restroom without difficulty.

## 2017-05-13 NOTE — Discharge Instructions (Signed)
Continue taking all your at home medications. Try to rest, ice, and elevate your legs for pain control. Follow-up with your primary care doctor for further evaluation and management as needed. Return to the emergency room if you develop worsening pain, inability to walk, or any new or concerning symptoms.

## 2017-05-19 ENCOUNTER — Other Ambulatory Visit: Payer: Self-pay | Admitting: Internal Medicine

## 2017-05-20 ENCOUNTER — Ambulatory Visit: Payer: Self-pay

## 2017-05-20 NOTE — Progress Notes (Deleted)
Brandi Calderon is a 52 y.o. female with A8TM complicated by cardiovascular risk factors, last seen at the [facility] on 02/18/2017.  Assessment and Plan:   A1C today was xxx (last A1C 8.2 02/18/2017)  Subjective:  Interim history:   SOB - Pt has reported SOB, waking up gasping for last xxxx months. Current smoker, smokes xxx  Diet -  Exercise -  Diabetes - Pt is taking 1000mg  of Metformin. Pt reports she has not been taking the full 100 units insulin b/c her glucose has been running low - she took 20 units today. Pt checks glucose once and it typically runs at 100 in the morning. A1c 8.2 in 01/2017   Other problems include:  Patient Active Problem List   Diagnosis Date Noted  . Foot pain, right 04/30/2016  . Depression, major, single episode, moderate ([place]) 03/11/2016  . Neuropathy due to type 2 diabetes mellitus ([place]) - severe 12/19/2015  . Menorrhagia 09/28/2015  . Type 2 diabetes mellitus treated with insulin ([place]) 05/24/2015  . Back pain 05/24/2015    Her current medications include: Current Outpatient Medications  Medication Sig Dispense Refill  . Aspirin-Salicylamide-Caffeine (BC HEADACHE POWDER PO) Take 4 packets by mouth daily as needed (pain).    . baclofen (LIORESAL) 10 MG tablet Take 1 tablet (10 mg total) by mouth 3 (three) times daily as needed for muscle spasms. 30 tablet 0  . citalopram (CELEXA) 20 MG tablet Take 40 mg by mouth 2 (two) times daily.     . clonazePAM (KLONOPIN) 0.5 MG tablet Take 0.5 mg by mouth 2 (two) times daily as needed for anxiety (Sometimes takes 3 tablets a day).     . cyclobenzaprine (FLEXERIL) 10 MG tablet Take 1 tablet (10 mg total) by mouth daily. 90 tablet 3  . dapagliflozin propanediol (FARXIGA) 5 MG TABS tablet Take 10 mg by mouth daily. 180 tablet 3  . furosemide (LASIX) 40 MG tablet Take 1 tablet (40 mg total) by mouth daily. 90 tablet 4  . gabapentin (NEURONTIN) 600 MG tablet TAKE TWO TABLETS (1200 MG) BY MOUTH 3 TIMES A DAY 180 tablet 0   . ibuprofen (ADVIL,MOTRIN) 800 MG tablet Take 1 tablet (800 mg total) by mouth every 8 (eight) hours as needed. 100 tablet 0  . medroxyPROGESTERone (PROVERA) 10 MG tablet Take 1 tablet (10 mg total) by mouth daily. 30 tablet 11  . metFORMIN (GLUCOPHAGE) 1000 MG tablet Take 1 tablet (1,000 mg total) by mouth 2 (two) times daily with a meal. 180 tablet 3  . Omega-3 Fatty Acids (FISH OIL) 1000 MG CAPS Take 1 capsule (1,000 mg total) by mouth daily. Reported on 09/19/2015 90 capsule 3  . ondansetron (ZOFRAN) 4 MG tablet Take 1 tablet (4 mg total) by mouth every 8 (eight) hours as needed for nausea or vomiting. 12 tablet 0  . simvastatin (ZOCOR) 20 MG tablet Take 1 tablet (20 mg total) by mouth daily. 90 tablet 3   No current facility-administered medications for this visit.     Exam:  Constitutional:  Alert, oriented, in NAD HENT: tender mass on L lower jawline. Approx 1in diameter. Eyes: EOMI Cardiovascular: Regular rhythm, no murmurs, gallops, rubs.  Respiratory: Lungs clear, no wheezes or rales Gastrointestinal: abdomen non tender, no HSM, no masses Skin: no rashes or lesions. Feet without lesions Neurologic: No Tremor:  light touch sensation 0/4 spots on R foot and 2/4 spots on L foot by monofilament Psychiatric: Oriented, normal mood and affect Heme/lymph/immunologic: no lymphadenopathy  Recent labs:  Results for orders placed or performed in visit on 04/02/17  POCT Glucose (Device for Home Use)  Result Value Ref Range   Glucose Fasting, POC 220 (A) 70 - 99 mg/dL   POC Glucose  70 - 99 mg/dl

## 2017-06-06 ENCOUNTER — Telehealth: Payer: Self-pay | Admitting: Pharmacy Technician

## 2017-06-06 NOTE — Telephone Encounter (Signed)
Patient failed to provide 2019 Financial Documentation.  No additional medication assistance will be provided by [facility] without the required proof of income documentation.  Patient notified by letter.  [facility]

## 2017-06-11 ENCOUNTER — Telehealth: Payer: Self-pay | Admitting: Pharmacist

## 2017-06-11 NOTE — Telephone Encounter (Signed)
06/11/2017 10:25:59 AM - Wilder Glade  06/11/17 Yesterday called [facility] for refill on patient's Farxiga 10mg , spoke with Janett Billow, she stating patient is in a Temporary enrollment status, they will need a Medicaid denial to fully enroll patient and process for further refills. When she saw that is was [place], she stated since [place] did not expand guidelines, we could mail a letter and have the [place] guidelines showing patient does not qualify. I have typed letter and guidelines attached, faxing to [place] to complete enrollment process and send refill.Delos Haring

## 2017-06-16 ENCOUNTER — Other Ambulatory Visit: Payer: Self-pay | Admitting: Internal Medicine

## 2017-06-23 ENCOUNTER — Telehealth: Payer: Self-pay | Admitting: Pharmacy Technician

## 2017-06-23 NOTE — Telephone Encounter (Signed)
Received updated proof of income.  Patient eligible to receive medication assistance at [facility] through 2019, as long as eligibility requirements continue to be met.  [facility]

## 2017-06-25 ENCOUNTER — Other Ambulatory Visit: Payer: Self-pay

## 2017-06-25 DIAGNOSIS — E119 Type 2 diabetes mellitus without complications: Secondary | ICD-10-CM

## 2017-06-26 LAB — SPECIMEN STATUS REPORT

## 2017-06-27 LAB — CBC
Hematocrit: 38.3 % (ref 34.0–46.6)
Hemoglobin: 12.8 g/dL (ref 11.1–15.9)
MCH: 30 pg (ref 26.6–33.0)
MCHC: 33.4 g/dL (ref 31.5–35.7)
MCV: 90 fL (ref 79–97)
PLATELETS: 348 10*3/uL (ref 150–379)
RBC: 4.27 x10E6/uL (ref 3.77–5.28)
RDW: 15 % (ref 12.3–15.4)
WBC: 9.3 10*3/uL (ref 3.4–10.8)

## 2017-06-27 LAB — COMPREHENSIVE METABOLIC PANEL
A/G RATIO: 1.8 (ref 1.2–2.2)
ALK PHOS: 105 IU/L (ref 39–117)
ALT: 7 IU/L (ref 0–32)
AST: 8 IU/L (ref 0–40)
Albumin: 4.3 g/dL (ref 3.5–5.5)
BILIRUBIN TOTAL: 0.3 mg/dL (ref 0.0–1.2)
BUN/Creatinine Ratio: 15 (ref 9–23)
BUN: 10 mg/dL (ref 6–24)
CHLORIDE: 97 mmol/L (ref 96–106)
CO2: 27 mmol/L (ref 20–29)
Calcium: 9.8 mg/dL (ref 8.7–10.2)
Creatinine, Ser: 0.65 mg/dL (ref 0.57–1.00)
GFR calc Af Amer: 121 mL/min/{1.73_m2} (ref >59)
GFR calc non Af Amer: 105 mL/min/{1.73_m2} (ref >59)
Globulin, Total: 2.4 g/dL (ref 1.5–4.5)
Glucose: 205 mg/dL — ABNORMAL HIGH (ref 65–99)
POTASSIUM: 5.5 mmol/L — AB (ref 3.5–5.2)
Sodium: 139 mmol/L (ref 134–144)
Total Protein: 6.7 g/dL (ref 6.0–8.5)

## 2017-06-27 LAB — VITAMIN B12: VITAMIN B 12: 249 pg/mL (ref 232–1245)

## 2017-06-27 LAB — URIC ACID: URIC ACID: 3.9 mg/dL (ref 2.5–7.1)

## 2017-06-27 LAB — HEMOGLOBIN A1C
Est. average glucose Bld gHb Est-mCnc: 194 mg/dL
Hgb A1c MFr Bld: 8.4 % — ABNORMAL HIGH (ref 4.8–5.6)

## 2017-07-02 ENCOUNTER — Ambulatory Visit: Payer: Self-pay | Admitting: Internal Medicine

## 2017-07-02 ENCOUNTER — Encounter: Payer: Self-pay | Admitting: Internal Medicine

## 2017-07-02 VITALS — BP 136/68 | HR 69 | Temp 98.5°F | Wt 144.4 lb

## 2017-07-02 DIAGNOSIS — G629 Polyneuropathy, unspecified: Secondary | ICD-10-CM

## 2017-07-02 MED ORDER — GABAPENTIN 600 MG PO TABS: 600.0000 mg | ORAL_TABLET | Freq: Three times a day (TID) | ORAL | 0 refills | Status: DC

## 2017-07-02 NOTE — Progress Notes (Signed)
Subjective:    Patient ID: Brandi Calderon, female    DOB: 11-09-1965, 52 y.o.   MRN: [id number]  HPI   Pt here today for follow up. Needs refills. Pt asked about sleep study.  Patient Active Problem List   Diagnosis Date Noted  . Foot pain, right 04/30/2016  . Depression, major, single episode, moderate ([place]) 03/11/2016  . Neuropathy due to type 2 diabetes mellitus ([place]) - severe 12/19/2015  . Menorrhagia 09/28/2015  . Type 2 diabetes mellitus treated with insulin ([place]) 05/24/2015  . Back pain 05/24/2015   Allergies as of 07/02/2017      Reactions   Penicillins Anaphylaxis   Has patient had a PCN reaction causing immediate rash, facial/tongue/throat swelling, SOB or lightheadedness with hypotension: Yes Has patient had a PCN reaction causing severe rash involving mucus membranes or skin necrosis: No Has patient had a PCN reaction that required hospitalization: No Has patient had a PCN reaction occurring within the last 10 years: No If all of the above answers are "NO", then may proceed with Cephalosporin use. Throat swells   Vicodin [hydrocodone-acetaminophen] Hives   Victoza [liraglutide]    Severe nausea      Medication List        Accurate as of 07/02/17  9:43 AM. Always use your most recent med list.          baclofen 10 MG tablet Commonly known as:  LIORESAL Take 1 tablet (10 mg total) by mouth 3 (three) times daily as needed for muscle spasms.   BC HEADACHE POWDER PO Take 4 packets by mouth daily as needed (pain).   citalopram 20 MG tablet Commonly known as:  CELEXA Take 40 mg by mouth 2 (two) times daily.   clonazePAM 0.5 MG tablet Commonly known as:  KLONOPIN Take 0.5 mg by mouth 2 (two) times daily as needed for anxiety (Sometimes takes 3 tablets a day).   cyclobenzaprine 10 MG tablet Commonly known as:  FLEXERIL Take 1 tablet (10 mg total) by mouth daily.   dapagliflozin propanediol 5 MG Tabs tablet Commonly known as:  FARXIGA Take 10 mg by mouth  daily.   Fish Oil 1000 MG Caps Take 1 capsule (1,000 mg total) by mouth daily. Reported on 09/19/2015   furosemide 40 MG tablet Commonly known as:  LASIX Take 1 tablet (40 mg total) by mouth daily.   gabapentin 600 MG tablet Commonly known as:  NEURONTIN TAKE TWO TABLETS (1200 MG) BY MOUTH 3 TIMES A DAY   ibuprofen 800 MG tablet Commonly known as:  ADVIL,MOTRIN Take 1 tablet (800 mg total) by mouth every 8 (eight) hours as needed.   medroxyPROGESTERone 10 MG tablet Commonly known as:  PROVERA Take 1 tablet (10 mg total) by mouth daily.   metFORMIN 1000 MG tablet Commonly known as:  GLUCOPHAGE Take 1 tablet (1,000 mg total) by mouth 2 (two) times daily with a meal.   ondansetron 4 MG tablet Commonly known as:  ZOFRAN Take 1 tablet (4 mg total) by mouth every 8 (eight) hours as needed for nausea or vomiting.   simvastatin 20 MG tablet Commonly known as:  ZOCOR Take 1 tablet (20 mg total) by mouth daily.         Review of Systems BP 136/68   Pulse 69   Temp 98.5 F (36.9 C)   Wt 144 lb 6.4 oz (65.5 kg)   LMP 07/20/2015 (Exact Date)   BMI 27.28 kg/m      Objective:  Physical Exam  Constitutional: She is oriented to person, place, and time.  Cardiovascular: Normal rate, regular rhythm and normal heart sounds.  Pulmonary/Chest: Effort normal and breath sounds normal.  Neurological: She is alert and oriented to person, place, and time.          Assessment & Plan:  Bp looks good. Pt not taking insulin, wants to get a once a week pen. Pt was granted her disability should be receiving medicaid in May. Refills sent. Pt will wait until she receives insurance for the sleep study. Uric acid does not show possible gout. Labs look good. RTC 3 months. Labs week prior a1c, met c, cbc, microalbumin.

## 2017-07-03 ENCOUNTER — Telehealth: Payer: Self-pay | Admitting: Pharmacist

## 2017-07-03 NOTE — Telephone Encounter (Signed)
07/03/2017 9:04:34 AM - Levemir Vials refill  07/03/17 Velva Harman gave me a [employer] dated 04/14/17 where we received 12 vials of Levemir, she has requested that I place refill for Levemir Vials for this patient. I have printed refill request for max daily dose 100 units// Inject 20 units under the skin before breakfast and increase by 1 unit each day till BG < 130. #12; and sending to [facility] for Dr. Mable Fill to sign.Delos Haring

## 2017-07-11 ENCOUNTER — Telehealth: Payer: Self-pay | Admitting: Pharmacist

## 2017-07-11 NOTE — Telephone Encounter (Signed)
07/11/2017 11:15:15 AM - Levemir Vials refill  07/11/17 Faxed [employer] refill request for Levemir Vials Max daily dose 100 units -- Inject 20 units under the skin before breakfast, increase by 1 units each day till BG < 130. #12.Delos Haring

## 2017-07-17 ENCOUNTER — Telehealth: Payer: Self-pay | Admitting: Pharmacist

## 2017-07-17 NOTE — Telephone Encounter (Signed)
07/17/2017 9:47:05 AM - Levemir Vials Renewal  07/17/17 I had previously faxed 07/11/17 a refill request to [facility]--today I have received a fax from [place] stating "Patient has received the maximum number of product refills for the enrollment period." According to my records patient enrollment does end 08/23/17, I have start a renewal application-sending provider portion to [facility], & mailing patient her portion to sign & return.Brandi Calderon

## 2017-07-23 ENCOUNTER — Other Ambulatory Visit: Payer: Self-pay | Admitting: Internal Medicine

## 2017-07-28 ENCOUNTER — Telehealth: Payer: Self-pay | Admitting: Pharmacist

## 2017-07-28 NOTE — Telephone Encounter (Signed)
07/28/2017 11:50:15 AM - Levemir application for renewal  07/28/17 Levemir Renewal application-I have received provider portion back, holding for patient to return her portion, this was mailed to patient 07/17/17.Brandi Calderon

## 2017-08-04 ENCOUNTER — Other Ambulatory Visit: Payer: Self-pay | Admitting: Internal Medicine

## 2017-08-04 ENCOUNTER — Other Ambulatory Visit: Payer: Self-pay | Admitting: Adult Health Nurse Practitioner

## 2017-08-04 DIAGNOSIS — Z794 Long term (current) use of insulin: Principal | ICD-10-CM

## 2017-08-04 DIAGNOSIS — E118 Type 2 diabetes mellitus with unspecified complications: Secondary | ICD-10-CM

## 2017-08-04 DIAGNOSIS — E119 Type 2 diabetes mellitus without complications: Secondary | ICD-10-CM

## 2017-08-12 ENCOUNTER — Telehealth: Payer: Self-pay | Admitting: Pharmacist

## 2017-08-12 NOTE — Telephone Encounter (Signed)
08/12/2017 11:51:41 AM - Wilder Glade refill  08/12/17 Called [place] for refill on Farxiga 10mg  allow 7-10 business days to receive.Delos Haring

## 2017-08-14 ENCOUNTER — Encounter: Payer: PRIVATE HEALTH INSURANCE | Admitting: Pharmacist

## 2017-08-27 ENCOUNTER — Telehealth: Payer: Self-pay | Admitting: Pharmacy Technician

## 2017-08-27 NOTE — Telephone Encounter (Signed)
Patient called and spoke with Arletha Pili.  Patient stated that she now has Medicare and Medicaid.  Patient no longer eligible for [facility] services.  [facility]

## 2017-09-05 DIAGNOSIS — M5137 Other intervertebral disc degeneration, lumbosacral region: Secondary | ICD-10-CM | POA: Diagnosis not present

## 2017-09-05 DIAGNOSIS — E2839 Other primary ovarian failure: Secondary | ICD-10-CM | POA: Diagnosis not present

## 2017-09-05 DIAGNOSIS — Z1239 Encounter for other screening for malignant neoplasm of breast: Secondary | ICD-10-CM | POA: Diagnosis not present

## 2017-09-05 DIAGNOSIS — E1142 Type 2 diabetes mellitus with diabetic polyneuropathy: Secondary | ICD-10-CM | POA: Diagnosis not present

## 2017-09-05 DIAGNOSIS — J302 Other seasonal allergic rhinitis: Secondary | ICD-10-CM | POA: Diagnosis not present

## 2017-09-05 DIAGNOSIS — R69 Illness, unspecified: Secondary | ICD-10-CM | POA: Diagnosis not present

## 2017-09-05 DIAGNOSIS — M545 Low back pain: Secondary | ICD-10-CM | POA: Diagnosis not present

## 2017-09-05 DIAGNOSIS — Z0000 Encounter for general adult medical examination without abnormal findings: Secondary | ICD-10-CM | POA: Diagnosis not present

## 2017-09-05 DIAGNOSIS — M5136 Other intervertebral disc degeneration, lumbar region: Secondary | ICD-10-CM | POA: Diagnosis not present

## 2017-09-10 DIAGNOSIS — R69 Illness, unspecified: Secondary | ICD-10-CM | POA: Diagnosis not present

## 2017-10-01 ENCOUNTER — Ambulatory Visit
Admission: EM | Admit: 2017-10-01 | Discharge: 2017-10-01 | Disposition: A | Discharge status: Home / Self Care | Payer: Medicaid Other | Attending: Family Medicine | Admitting: Family Medicine

## 2017-10-01 ENCOUNTER — Ambulatory Visit: Payer: Self-pay | Admitting: Internal Medicine

## 2017-10-01 ENCOUNTER — Other Ambulatory Visit: Payer: Self-pay

## 2017-10-01 DIAGNOSIS — K111 Hypertrophy of salivary gland: Secondary | ICD-10-CM | POA: Diagnosis not present

## 2017-10-01 DIAGNOSIS — K1120 Sialoadenitis, unspecified: Secondary | ICD-10-CM

## 2017-10-01 DIAGNOSIS — G501 Atypical facial pain: Secondary | ICD-10-CM | POA: Diagnosis not present

## 2017-10-01 MED ORDER — CLINDAMYCIN HCL 300 MG PO CAPS: 300.0000 mg | ORAL_CAPSULE | Freq: Three times a day (TID) | ORAL | 0 refills | Status: AC

## 2017-10-01 MED ORDER — DICLOFENAC SODIUM 75 MG PO TBEC: 75.0000 mg | DELAYED_RELEASE_TABLET | Freq: Two times a day (BID) | ORAL | 0 refills | Status: DC | PRN

## 2017-10-01 MED ORDER — KETOROLAC TROMETHAMINE 60 MG/2ML IM SOLN
60.0000 mg | Freq: Once | INTRAMUSCULAR | Status: AC
  Administered 2017-10-01: 60 mg via INTRAMUSCULAR

## 2017-10-01 NOTE — Discharge Instructions (Addendum)
You were given a shot of Toradol today for pain and inflammation. Recommend start Clindamycin 300mg  3 times a day for 10 days. Apply warm compresses to face every 1 to 2 hours for comfort. Recommend take Voltaren 75mg  every 12 hours as needed for pain. Recommend suck on a hard tart candy to help to stimulate parotid gland to help with infection. If pain and swelling has not improved within 48 hours, go to the ER for further evaluation.

## 2017-10-01 NOTE — ED Provider Notes (Signed)
[facility]    CSN: [id number] Arrival date & time: 10/01/17  1153     History   Chief Complaint Chief Complaint  Patient presents with  . Facial Swelling    HPI Brandi Calderon is a 52 y.o. female.   52 year old female presents with right sided facial/jaw swelling and pain that she woke up to yesterday morning. Today the pain and swelling has gotten much worse. She has tried applying ice and alternating with heat to the area with minimal relief. She has also taken Tylenol and BC Powder for pain with no relief. She denies any trauma to the area. She wears both upper and lower dentures and denies any distinct difficulty with her dentures recently. She also denies any fever, ear pain, nasal congestion, sore throat, cough or GI symptoms. Other chronic health issues include type 2 DM, HTN, hyperlipidemia, back pain, arthritis and depression. She is currently on Glucophage, Farxiga, Zocor, Lasix, Neurontin, Celexa, Klonopin, Flonase daily and Flexeril prn. She does smoke cigarettes daily.   The history is provided by the patient.    Past Medical History:  Diagnosis Date  . Arthritis   . Back pain   . Depression   . Diabetes mellitus   . Headache   . Hypertension   . Tendonitis     Patient Active Problem List   Diagnosis Date Noted  . Foot pain, right 04/30/2016  . Depression, major, single episode, moderate ([place]) 03/11/2016  . Neuropathy due to type 2 diabetes mellitus ([place]) - severe 12/19/2015  . Menorrhagia 09/28/2015  . Type 2 diabetes mellitus treated with insulin ([place]) 05/24/2015  . Back pain 05/24/2015    Past Surgical History:  Procedure Laterality Date  . CESAREAN SECTION    . CESAREAN SECTION      OB History   None      Home Medications    Prior to Admission medications   Medication Sig Start Date End Date Taking? Authorizing Provider  Aspirin-Salicylamide-Caffeine (BC HEADACHE POWDER PO) Take 4 packets by mouth daily as needed (pain).     [provider]  citalopram (CELEXA) 20 MG tablet Take 40 mg by mouth 2 (two) times daily.     [provider]  clindamycin (CLEOCIN) 300 MG capsule Take 1 capsule (300 mg total) by mouth 3 (three) times daily for 10 days. 10/01/17 10/11/17  Katy Apo, NP  clonazePAM (KLONOPIN) 0.5 MG tablet Take 0.5 mg by mouth 2 (two) times daily as needed for anxiety (Sometimes takes 3 tablets a day).     [provider]  cyclobenzaprine (FLEXERIL) 10 MG tablet Take 1 tablet (10 mg total) by mouth daily. 12/25/16   Tawni Millers, MD  dapagliflozin propanediol (FARXIGA) 5 MG TABS tablet Take 10 mg by mouth daily. 12/25/16   Tawni Millers, MD  diclofenac (VOLTAREN) 75 MG EC tablet Take 1 tablet (75 mg total) by mouth 2 (two) times daily as needed for moderate pain. 10/01/17   Katy Apo, NP  furosemide (LASIX) 40 MG tablet Take 1 tablet (40 mg total) by mouth daily. 02/18/17   Barnet Pall, MD  gabapentin (NEURONTIN) 600 MG tablet TAKE TWO TABLETS (1200 MG) BY MOUTH 3 TIMES A DAY 07/23/17   Tawni Millers, MD  ibuprofen (ADVIL,MOTRIN) 800 MG tablet Take 1 tablet (800 mg total) by mouth every 8 (eight) hours as needed. 02/13/16   Birdie Sons, MD  medroxyPROGESTERone (PROVERA) 10 MG tablet Take 1  tablet (10 mg total) by mouth daily. 09/18/16   Tawni Millers, MD  metFORMIN (GLUCOPHAGE) 1000 MG tablet TAKE ONE TABLET BY MOUTH 2 TIMES A DAY WITH A MEAL 08/04/17   Doles-Johnson, Teah, NP  Omega-3 Fatty Acids (FISH OIL) 1000 MG CAPS Take 1 capsule (1,000 mg total) by mouth daily. Reported on 09/19/2015 06/26/16   Tawni Millers, MD  simvastatin (ZOCOR) 20 MG tablet TAKE ONE TABLET BY MOUTH EVERY DAY 08/04/17   Tawni Millers, MD    Family History Family History  Problem Relation Age of Onset  . Kidney disease Father   . Diabetes Father   . Dementia Father   . Diabetes Sister   . Diabetes Brother     Social History Social History   Tobacco Use  . Smoking status: Current Every  Day Smoker    Packs/day: 3.00    Years: 33.00    Pack years: 99.00    Types: Cigarettes  . Smokeless tobacco: Current User  Substance Use Topics  . Alcohol use: No  . Drug use: No     Allergies   Penicillins; Vicodin [hydrocodone-acetaminophen]; and Victoza [liraglutide]   Review of Systems Review of Systems  Constitutional: Positive for fatigue. Negative for activity change, appetite change, chills and fever.  HENT: Positive for facial swelling (right side only with pain). Negative for dental problem (wear dentures), ear discharge, ear pain, mouth sores, postnasal drip, rhinorrhea, sinus pressure, sinus pain, sneezing, sore throat and trouble swallowing.   Eyes: Negative for pain, discharge, redness and itching.  Respiratory: Negative for cough, chest tightness and shortness of breath.   Gastrointestinal: Negative for diarrhea, nausea and vomiting.  Musculoskeletal: Positive for arthralgias, back pain and myalgias.  Skin: Negative for color change, rash and wound.  Neurological: Positive for headaches. Negative for dizziness, tremors, seizures, syncope, speech difficulty, weakness, light-headedness and numbness.     Physical Exam Triage Vital Signs ED Triage Vitals  Enc Vitals Group     BP 10/01/17 1201 (!) 155/70     Pulse Rate 10/01/17 1201 65     Resp 10/01/17 1201 18     Temp 10/01/17 1201 98.1 F (36.7 C)     Temp Source 10/01/17 1201 Oral     SpO2 10/01/17 1201 100 %     Weight 10/01/17 1203 146 lb (66.2 kg)     Height 10/01/17 1203 5\' 1"  (1.549 m)     Head Circumference --      Peak Flow --      Pain Score 10/01/17 1203 10     Pain Loc --      Pain Edu? --      Excl. in [place]? --    No data found.  Updated Vital Signs BP (!) 155/70 (BP Location: Left Arm)   Pulse 65   Temp 98.1 F (36.7 C) (Oral)   Resp 18   Ht 5\' 1"  (1.549 m)   Wt 146 lb (66.2 kg)   LMP 08/18/2017   SpO2 100%   BMI 27.59 kg/m   Visual Acuity Right Eye Distance:   Left Eye  Distance:   Bilateral Distance:    Right Eye Near:   Left Eye Near:    Bilateral Near:     Physical Exam  Constitutional: She is oriented to person, place, and time. She appears well-developed and well-nourished. She is cooperative. She appears ill. She appears distressed.  Patient sitting in exam chair crying and holding the right side of  her face due to pain.   HENT:  Head: Normocephalic. Head is without abrasion, without contusion, without right periorbital erythema and without left periorbital erythema.    Right Ear: Hearing, tympanic membrane, external ear and ear canal normal.  Left Ear: Hearing, tympanic membrane, external ear and ear canal normal.  Nose: Rhinorrhea present. No mucosal edema. Right sinus exhibits maxillary sinus tenderness. Right sinus exhibits no frontal sinus tenderness. Left sinus exhibits no maxillary sinus tenderness and no frontal sinus tenderness.  Mouth/Throat: Oropharynx is clear and moist and mucous membranes are normal. She has dentures (removed today). No oral lesions. No dental abscesses or uvula swelling.  Right sided facial swelling from base of ear to mid-maxillary area. No distinct redness. Extremely tender over entire area- particular around Parotid gland. Difficulty opening mouth due to pain and swelling.   Inside right upper posterior gum area slightly red and inflamed but no discharge present. Very tender.   Eyes: Pupils are equal, round, and reactive to light. EOM are normal. Right conjunctiva is injected (due to crying). Left conjunctiva is injected (due to crying).  Neck: Normal range of motion. Neck supple.  Cardiovascular: Normal rate, regular rhythm and normal heart sounds.  No murmur heard. Pulmonary/Chest: Effort normal and breath sounds normal. No respiratory distress.  Lymphadenopathy:       Head (right side): Preauricular adenopathy present. No submental, no submandibular, no tonsillar and no posterior auricular adenopathy present.        Head (left side): No submental, no submandibular, no tonsillar, no preauricular and no posterior auricular adenopathy present.  Neurological: She is alert and oriented to person, place, and time. No cranial nerve deficit or sensory deficit.  Skin: Skin is warm and dry. No rash noted. No erythema.  Psychiatric: Her speech is normal and behavior is normal. Thought content normal. Her mood appears anxious.  She appears anxious and in pain.   Vitals reviewed.    UC Treatments / Results  Labs (all labs ordered are listed, but only abnormal results are displayed) Labs Reviewed - No data to display  EKG None  Radiology No results found.  Procedures Procedures (including critical care time)  Medications Ordered in UC Medications  ketorolac (TORADOL) injection 60 mg (60 mg Intramuscular Given 10/01/17 1258)    Initial Impression / Assessment and Plan / UC Course  I have reviewed the triage vital signs and the nursing notes.  Pertinent labs & imaging results that were available during my care of the patient were reviewed by me and considered in my medical decision making (see chart for details).    Consulted with Dr. Zenda Alpers who also examined patient. Discussed with patient that she appears to have a probable infection of her right Parotid gland. Gave Toradol 60mg  IM now to help with pain and inflammation. Will start Clindamycin 300mg  as directed. May take Voltaren 75mg  twice a day as needed for pain. Apply warm compresses to area to help with pain and swelling. Also recommend suck on a tart hard candy to help stimulate Parotid gland to help with infection. Discussed that she may need drainage of Parotid gland. Recommend go to the ER within 48 hours if minimal improvement in symptoms.  Final Clinical Impressions(s) / UC Diagnoses   Final diagnoses:  Parotid gland enlargement  Infection of parotid gland     Discharge Instructions     You were given a shot of Toradol today for pain  and inflammation. Recommend start Clindamycin 300mg  3 times a day for  10 days. Apply warm compresses to face every 1 to 2 hours for comfort. Recommend take Voltaren 75mg  every 12 hours as needed for pain. Recommend suck on a hard tart candy to help to stimulate parotid gland to help with infection. If pain and swelling has not improved within 48 hours, go to the ER for further evaluation.     ED Prescriptions    Medication Sig Dispense Auth. Provider   clindamycin (CLEOCIN) 300 MG capsule Take 1 capsule (300 mg total) by mouth 3 (three) times daily for 10 days. 30 capsule Katy Apo, NP   diclofenac (VOLTAREN) 75 MG EC tablet Take 1 tablet (75 mg total) by mouth 2 (two) times daily as needed for moderate pain. 20 tablet Katy Apo, NP     Controlled Substance Prescriptions [place] Controlled Substance Registry consulted? Yes, I have consulted the [place] Controlled Substances Registry for this patient. Only current Rx is Klonopin which has been filled monthly for the past 6 months. No other active RX in the past 6 months. No controlled substance Rx provided today- may need additional evaluation and treatment before pain medication provided.    Katy Apo, NP 10/01/17 1407

## 2017-10-01 NOTE — ED Triage Notes (Signed)
Pt with right sided facial/jaw swelling starting yesterday and much worse today. Pt wears dentures. Pain 10/10

## 2017-10-02 ENCOUNTER — Emergency Department (HOSPITAL_COMMUNITY)
Admission: EM | Admit: 2017-10-02 | Discharge: 2017-10-02 | Disposition: A | Discharge status: Home / Self Care | Payer: Medicare HMO | Attending: Emergency Medicine | Admitting: Emergency Medicine

## 2017-10-02 ENCOUNTER — Emergency Department (HOSPITAL_COMMUNITY): Payer: Medicare HMO

## 2017-10-02 ENCOUNTER — Other Ambulatory Visit: Payer: Self-pay

## 2017-10-02 ENCOUNTER — Encounter (HOSPITAL_COMMUNITY): Payer: Self-pay

## 2017-10-02 DIAGNOSIS — R600 Localized edema: Secondary | ICD-10-CM | POA: Diagnosis present

## 2017-10-02 DIAGNOSIS — Z79899 Other long term (current) drug therapy: Secondary | ICD-10-CM | POA: Insufficient documentation

## 2017-10-02 DIAGNOSIS — R69 Illness, unspecified: Secondary | ICD-10-CM | POA: Diagnosis not present

## 2017-10-02 DIAGNOSIS — F17210 Nicotine dependence, cigarettes, uncomplicated: Secondary | ICD-10-CM | POA: Insufficient documentation

## 2017-10-02 DIAGNOSIS — I10 Essential (primary) hypertension: Secondary | ICD-10-CM | POA: Diagnosis not present

## 2017-10-02 DIAGNOSIS — K1120 Sialoadenitis, unspecified: Secondary | ICD-10-CM | POA: Diagnosis not present

## 2017-10-02 DIAGNOSIS — Z7984 Long term (current) use of oral hypoglycemic drugs: Secondary | ICD-10-CM | POA: Insufficient documentation

## 2017-10-02 DIAGNOSIS — E119 Type 2 diabetes mellitus without complications: Secondary | ICD-10-CM | POA: Insufficient documentation

## 2017-10-02 LAB — CBC WITH DIFFERENTIAL/PLATELET
Abs Immature Granulocytes: 0.1 10*3/uL (ref 0.0–0.1)
Basophils Absolute: 0 10*3/uL (ref 0.0–0.1)
Basophils Relative: 0 %
Eosinophils Absolute: 0.1 10*3/uL (ref 0.0–0.7)
Eosinophils Relative: 1 %
HCT: 41.9 % (ref 36.0–46.0)
Hemoglobin: 13.8 g/dL (ref 12.0–15.0)
Immature Granulocytes: 0 %
Lymphocytes Relative: 13 %
Lymphs Abs: 2 10*3/uL (ref 0.7–4.0)
MCH: 29.4 pg (ref 26.0–34.0)
MCHC: 32.9 g/dL (ref 30.0–36.0)
MCV: 89.1 fL (ref 78.0–100.0)
Monocytes Absolute: 1 10*3/uL (ref 0.1–1.0)
Monocytes Relative: 7 %
Neutro Abs: 12 10*3/uL — ABNORMAL HIGH (ref 1.7–7.7)
Neutrophils Relative %: 79 %
Platelets: 254 10*3/uL (ref 150–400)
RBC: 4.7 MIL/uL (ref 3.87–5.11)
RDW: 14.8 % (ref 11.5–15.5)
WBC: 15.2 10*3/uL — ABNORMAL HIGH (ref 4.0–10.5)

## 2017-10-02 LAB — BASIC METABOLIC PANEL
Anion gap: 10 (ref 5–15)
BUN: 10 mg/dL (ref 6–20)
CO2: 28 mmol/L (ref 22–32)
Calcium: 9.2 mg/dL (ref 8.9–10.3)
Chloride: 100 mmol/L (ref 98–111)
Creatinine, Ser: 0.71 mg/dL (ref 0.44–1.00)
GFR calc Af Amer: >60 mL/min (ref >60)
GFR calc non Af Amer: >60 mL/min (ref >60)
Glucose, Bld: 143 mg/dL — ABNORMAL HIGH (ref 70–99)
Potassium: 3.4 mmol/L — ABNORMAL LOW (ref 3.5–5.1)
Sodium: 138 mmol/L (ref 135–145)

## 2017-10-02 MED ORDER — IOHEXOL 300 MG/ML  SOLN
75.0000 mL | Freq: Once | INTRAMUSCULAR | Status: AC | PRN
  Administered 2017-10-02: 75 mL via INTRAVENOUS

## 2017-10-02 MED ORDER — FENTANYL CITRATE (PF) 100 MCG/2ML IJ SOLN
50.0000 ug | Freq: Once | INTRAMUSCULAR | Status: AC
  Administered 2017-10-02: 50 ug via INTRAVENOUS
  Filled 2017-10-02: qty 2

## 2017-10-02 MED ORDER — OXYCODONE-ACETAMINOPHEN 5-325 MG PO TABS: 1.0000 | ORAL_TABLET | Freq: Four times a day (QID) | ORAL | 0 refills | Status: DC | PRN

## 2017-10-02 NOTE — ED Triage Notes (Signed)
Pt states that she has abscess in her mouth, was wearing dentures but unable to do so due to oral swelling and pain. States that the pain had sudden onset yesterday, was given antibiotics and told to use warm compresses. Pt c/o feeling worse.

## 2017-10-02 NOTE — ED Notes (Signed)
Pt verbalized understanding discharge instructions and denies any further needs or questions at this time. VS stable, ambulatory and steady gait.   

## 2017-10-02 NOTE — Discharge Instructions (Addendum)
Please read attached information. If you experience any new or worsening signs or symptoms please return to the emergency room for evaluation. Please follow-up with your primary care provider or specialist as discussed. Please use medication prescribed only as directed and discontinue taking if you have any concerning signs or symptoms.   °

## 2017-10-02 NOTE — ED Provider Notes (Signed)
[facility] EMERGENCY DEPARTMENT Provider Note   CSN: [id number] Arrival date & time: 10/02/17  1416    History   Chief Complaint Chief Complaint  Patient presents with  . Abscess    HPI Brandi Calderon is a 52 y.o. female.  HPI   52 year old female presents today with complaints of parotid gland swelling. Patient notes 2 day history of swelling to the right lateral jaw inferior to the ear. Patient notes while using ice and heat with minimal relief. Patient has taken Tylenol and BC powder as well. Patient denies any trauma, no she wears dentures. Patient reports that she was seen at urgent care yesterday and diagnosed with carotid Lane infection and started on pain medicine and antibiotics. Patient notes continued pain. She notes fever last night.  Past Medical History:  Diagnosis Date  . Arthritis   . Back pain   . Depression   . Diabetes mellitus   . Headache   . Hypertension   . Tendonitis     Patient Active Problem List   Diagnosis Date Noted  . Foot pain, right 04/30/2016  . Depression, major, single episode, moderate ([place]) 03/11/2016  . 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 Menorrhagia 09/28/2015  . Type 2 diabetes mellitus treated with insulin ([place]) 05/24/2015  . Back pain 05/24/2015    Past Surgical History:  Procedure Laterality Date  . CESAREAN SECTION    . CESAREAN SECTION       OB History   None      Home Medications    Prior to Admission medications   Medication Sig Start Date End Date Taking? Authorizing Provider  Aspirin-Salicylamide-Caffeine (BC HEADACHE POWDER PO) Take 4 packets by mouth daily as needed (pain).    [provider]  citalopram (CELEXA) 20 MG tablet Take 40 mg by mouth 2 (two) times daily.     [provider]  clindamycin (CLEOCIN) 300 MG capsule Take 1 capsule (300 mg total) by mouth 3 (three) times daily for 10 days. 10/01/17 10/11/17  Katy Apo, NP   clonazePAM (KLONOPIN) 0.5 MG tablet Take 0.5 mg by mouth 2 (two) times daily as needed for anxiety (Sometimes takes 3 tablets a day).     [provider]  cyclobenzaprine (FLEXERIL) 10 MG tablet Take 1 tablet (10 mg total) by mouth daily. 12/25/16   Tawni Millers, MD  dapagliflozin propanediol (FARXIGA) 5 MG TABS tablet Take 10 mg by mouth daily. 12/25/16   Tawni Millers, MD  diclofenac (VOLTAREN) 75 MG EC tablet Take 1 tablet (75 mg total) by mouth 2 (two) times daily as needed for moderate pain. 10/01/17   Katy Apo, NP  furosemide (LASIX) 40 MG tablet Take 1 tablet (40 mg total) by mouth daily. 02/18/17   Barnet Pall, MD  gabapentin (NEURONTIN) 600 MG tablet TAKE TWO TABLETS (1200 MG) BY MOUTH 3 TIMES A DAY 07/23/17   Tawni Millers, MD  ibuprofen (ADVIL,MOTRIN) 800 MG tablet Take 1 tablet (800 mg total) by mouth every 8 (eight) hours as needed. 02/13/16   Birdie Sons, MD  medroxyPROGESTERone (PROVERA) 10 MG tablet Take 1 tablet (10 mg total) by mouth daily. 09/18/16   Tawni Millers, MD  metFORMIN (GLUCOPHAGE) 1000 MG tablet TAKE ONE TABLET BY MOUTH 2 TIMES A DAY WITH A MEAL 08/04/17   Doles-Johnson, Teah, NP  Omega-3 Fatty Acids (FISH OIL) 1000 MG CAPS Take 1 capsule (1,000  mg total) by mouth daily. Reported on 09/19/2015 06/26/16   Tawni Millers, MD  oxyCODONE-acetaminophen (PERCOCET/ROXICET) 5-325 MG tablet Take 1 tablet by mouth every 6 (six) hours as needed for severe pain. 10/02/17   Ahmarion Saraceno, Dellis Filbert, PA-C  simvastatin (ZOCOR) 20 MG tablet TAKE ONE TABLET BY MOUTH EVERY DAY 08/04/17   Tawni Millers, MD    Family History Family History  Problem Relation Age of Onset  . Kidney disease Father   . Diabetes Father   . Dementia Father   . Diabetes Sister   . Diabetes Brother     Social History Social History   Tobacco Use  . Smoking status: Current Every Day Smoker    Packs/day: 3.00    Years: 33.00    Pack years: 99.00    Types: Cigarettes  . Smokeless tobacco:  Current User  Substance Use Topics  . Alcohol use: No  . Drug use: No     Allergies   Penicillins; Vicodin [hydrocodone-acetaminophen]; and Victoza [liraglutide]   Review of Systems Review of Systems  All other systems reviewed and are negative.    Physical Exam Updated Vital Signs BP 132/65   Pulse 74   Temp 99 F (37.2 C) (Oral)   Resp 17   Ht 5\' 1"  (1.549 m)   Wt 66.2 kg (146 lb)   SpO2 97%   BMI 27.59 kg/m   Physical Exam  Constitutional: She is oriented to person, place, and time. She appears well-developed and well-nourished.  HENT:  Head: Normocephalic and atraumatic.  Firmness and induration with swelling noted of the right parotid, limited range of motion of the jaw, intraoral exam without tenderness or swelling neck supple full active range of motion  Eyes: Pupils are equal, round, and reactive to light. Conjunctivae are normal. Right eye exhibits no discharge. Left eye exhibits no discharge. No scleral icterus.  Neck: Normal range of motion. No JVD present. No tracheal deviation present.  Pulmonary/Chest: Effort normal. No stridor.  Neurological: She is alert and oriented to person, place, and time. Coordination normal.  Psychiatric: She has a normal mood and affect. Her behavior is normal. Judgment and thought content normal.  Nursing note and vitals reviewed.   ED Treatments / Results  Labs (all labs ordered are listed, but only abnormal results are displayed) Labs Reviewed  CBC WITH DIFFERENTIAL/PLATELET - Abnormal; Notable for the following components:      Result Value   WBC 15.2 (*)    Neutro Abs 12.0 (*)    All other components within normal limits  BASIC METABOLIC PANEL - Abnormal; Notable for the following components:   Potassium 3.4 (*)    Glucose, Bld 143 (*)    All other components within normal limits    EKG None  Radiology Ct Soft Tissue Neck W Contrast  Result Date: 10/02/2017 CLINICAL DATA:  52 year old female with right side  neck pain and swelling for 3 days. EXAM: CT NECK WITH CONTRAST TECHNIQUE: Multidetector CT imaging of the neck was performed using the standard protocol following the bolus administration of intravenous contrast. CONTRAST:  43mL OMNIPAQUE IOHEXOL 300 MG/ML  SOLN COMPARISON:  Head CT without contrast 11/11/2015. FINDINGS: Pharynx and larynx: Normal larynx. Pharyngeal soft tissue contours appear normal aside from perhaps mild right lateral pharyngeal wall mucosal thickening. Likewise, there is subtle inflammatory stranding in the right parapharyngeal space. The left parapharyngeal and retropharyngeal spaces are normal. Salivary glands: Normal sublingual space, left submandibular gland, and left parotid gland. A marked  area of clinical concern on series 3, image 23 corresponds to an asymmetrically enlarged and hyperenhancing right parotid gland. There is generalized right parotid swelling and enhancement with regional superficial soft tissue stranding. No discrete parotid lesion. Soft tissue stranding and thickening of the platysma track inferiorly from the right parotid space to the right submandibular space. There is no convincing asymmetric hyperenhancement of the right submandibular gland. No sialolithiasis identified. The right parotid duct appears mildly hyperenhancing, but not dilated (series 3, image 15). Thyroid: Superficial soft tissue stranding continues in the midline and right anterior neck to just above the level of the thyroid. The thyroid is normal. Lymph nodes: Small but asymmetrically enlarged and mildly hyperenhancing right level IIa lymph nodes up to 6 millimeters short axis. Minimal similar asymmetry of the right level 3 nodes. No cystic or necrotic nodes. Vascular: Major vascular structures in the neck and at the skull base are patent. There is proximal left ICA low-density plaque which does not appear hemodynamically significant at this time. Limited intracranial: Negative. Visualized orbits: Not  included. Mastoids and visualized paranasal sinuses: Clear. Skeleton: Absent dentition. Age concordant cervical spine degeneration. No acute osseous abnormality identified. Upper chest: Lung apices within normal limits. Normal visible mediastinum and axillary lymph nodes. IMPRESSION: 1. Acute Parotitis on the right. No sialolithiasis or parotid duct enlargement such that viral/infectious etiology is favored. 2. Reactive lymphadenopathy. Soft tissue inflammation tracking into the right submandibular and parapharyngeal spaces. 3. No abscess or complicating features. Electronically Signed   By: Genevie Ann M.D.   On: 10/02/2017 17:37    Procedures Procedures (including critical care time)  Medications Ordered in ED Medications  fentaNYL (SUBLIMAZE) injection 50 mcg (50 mcg Intravenous Given 10/02/17 1546)  iohexol (OMNIPAQUE) 300 MG/ML solution 75 mL (75 mLs Intravenous Contrast Given 10/02/17 1711)     Initial Impression / Assessment and Plan / ED Course  I have reviewed the triage vital signs and the nursing notes.  Pertinent labs & imaging results that were available during my care of the patient were reviewed by me and considered in my medical decision making (see chart for details).     Labs: cbc. bmp  Imaging: CT soft tissue neck   Consults:  Therapeutics: fentanyl  Discharge Meds: percocet, continue clindamycin  Assessment/Plan: 52 year old female presents today with acute parotitis. No complaints features here. She is afebrile and well-appearing. CT scan is reassuring, slight elevation in white count here. Patient is encouraged to continue clindamycin, encouraged her to keep using this, she'll be given a prescription for Ultram as needed for pain, encouraged to use anti-inflammatories. She will follow-up if symptoms persist or immediately follow-up if they worsen. Both the patient and her daughter verbalized understanding and agreement to today's plan had no further questions concerns at  time of discharge.   Final Clinical Impressions(s) / ED Diagnoses   Final diagnoses:  Parotitis    ED Discharge Orders        Ordered    oxyCODONE-acetaminophen (PERCOCET/ROXICET) 5-325 MG tablet  Every 6 hours PRN     10/02/17 1752       Okey Regal, PA-C 10/02/17 1754    Hayden Rasmussen, MD 10/03/17 [phone]

## 2017-10-02 NOTE — ED Notes (Signed)
Per RN/PA, Pt ok to drink; given Coke.

## 2017-10-06 DIAGNOSIS — N920 Excessive and frequent menstruation with regular cycle: Secondary | ICD-10-CM | POA: Diagnosis not present

## 2017-10-06 DIAGNOSIS — E1040 Type 1 diabetes mellitus with diabetic neuropathy, unspecified: Secondary | ICD-10-CM | POA: Diagnosis not present

## 2017-10-06 DIAGNOSIS — M545 Low back pain: Secondary | ICD-10-CM | POA: Diagnosis not present

## 2017-10-06 DIAGNOSIS — E1142 Type 2 diabetes mellitus with diabetic polyneuropathy: Secondary | ICD-10-CM | POA: Diagnosis not present

## 2017-10-10 ENCOUNTER — Emergency Department (HOSPITAL_COMMUNITY)
Admission: EM | Admit: 2017-10-10 | Discharge: 2017-10-10 | Disposition: A | Discharge status: Home / Self Care | Payer: Medicare HMO | Attending: Emergency Medicine | Admitting: Emergency Medicine

## 2017-10-10 ENCOUNTER — Encounter (HOSPITAL_COMMUNITY): Payer: Self-pay | Admitting: Emergency Medicine

## 2017-10-10 DIAGNOSIS — R55 Syncope and collapse: Secondary | ICD-10-CM | POA: Diagnosis present

## 2017-10-10 DIAGNOSIS — E119 Type 2 diabetes mellitus without complications: Secondary | ICD-10-CM | POA: Insufficient documentation

## 2017-10-10 HISTORY — DX: Type 2 diabetes mellitus without complications: E11.9

## 2017-10-10 LAB — CBC WITH DIFFERENTIAL/PLATELET
Basophils Absolute: 0.1 10*3/uL (ref 0.0–0.1)
Basophils Relative: 1 %
Eosinophils Absolute: 0.2 10*3/uL (ref 0.0–0.7)
Eosinophils Relative: 2 %
HCT: 38 % (ref 36.0–46.0)
Hemoglobin: 12.7 g/dL (ref 12.0–15.0)
Lymphocytes Relative: 41 %
Lymphs Abs: 4.5 10*3/uL — ABNORMAL HIGH (ref 0.7–4.0)
MCH: 29.5 pg (ref 26.0–34.0)
MCHC: 33.4 g/dL (ref 30.0–36.0)
MCV: 88.2 fL (ref 78.0–100.0)
Monocytes Absolute: 0.5 10*3/uL (ref 0.1–1.0)
Monocytes Relative: 5 %
Neutro Abs: 5.6 10*3/uL (ref 1.7–7.7)
Neutrophils Relative %: 51 %
Platelets: 321 10*3/uL (ref 150–400)
RBC: 4.31 MIL/uL (ref 3.87–5.11)
RDW: 14.4 % (ref 11.5–15.5)
WBC: 10.9 10*3/uL — ABNORMAL HIGH (ref 4.0–10.5)

## 2017-10-10 LAB — BASIC METABOLIC PANEL
Anion gap: 14 (ref 5–15)
BUN: 14 mg/dL (ref 6–20)
CO2: 26 mmol/L (ref 22–32)
Calcium: 9.7 mg/dL (ref 8.9–10.3)
Chloride: 101 mmol/L (ref 98–111)
Creatinine, Ser: 0.77 mg/dL (ref 0.44–1.00)
GFR calc Af Amer: >60 mL/min (ref >60)
GFR calc non Af Amer: >60 mL/min (ref >60)
Glucose, Bld: 200 mg/dL — ABNORMAL HIGH (ref 70–99)
Potassium: 3.8 mmol/L (ref 3.5–5.1)
Sodium: 141 mmol/L (ref 135–145)

## 2017-10-10 LAB — CBG MONITORING, ED: Glucose-Capillary: 201 mg/dL — ABNORMAL HIGH (ref 70–99)

## 2017-10-10 NOTE — ED Provider Notes (Signed)
[facility] EMERGENCY DEPARTMENT Provider Note   CSN: [id number] Arrival date & time: 10/10/17  1320     History   Chief Complaint Chief Complaint  Patient presents with  . Loss of Consciousness    HPI Brandi Calderon is a 52 y.o. female.  HPI   52 year old female presented with a syncopal event.  She is here in the emergency room visiting her family.  I had actually spoken with her about 10 minutes prior to this event.  She seemed to be fine at that point in time.  She left the ER to walk out in the parking lot when she began to feel hot and sweaty.  She felt like she needed to sit down immediately.  She felt sick to her stomach.  Her vision got dark.  She is unsure if she actually lost consciousness.  Staff that his sister reported a lot reported that she appeared to lose consciousness briefly.  She had a quick return to baseline.  By the time I evaluated her shortly later in the emergency room she was without any complaints.  She states that she had been feeling relatively fine earlier today.  Denies any history of recurrent syncope.  She denies having any pain at any point.  Past Medical History:  Diagnosis Date  . Diabetes mellitus without complication ([place])     There are no active problems to display for this patient.     OB History   None      Home Medications    Prior to Admission medications   Not on File    Family History No family history on file.  Social History Social History   Tobacco Use  . Smoking status: Not on file  Substance Use Topics  . Alcohol use: Not on file  . Drug use: Not on file     Allergies   Penicillins and Vicodin [hydrocodone-acetaminophen]   Review of Systems Review of Systems  All systems reviewed and negative, other than as noted in HPI.  Physical Exam Updated Vital Signs BP 106/83 (BP Location: Right Arm)   Pulse 60   Temp 98 F (36.7 C) (Oral)   Resp (!) 21   SpO2 100%   Physical Exam    Constitutional: She appears well-developed and well-nourished. No distress.  HENT:  Head: Normocephalic and atraumatic.  Eyes: Conjunctivae are normal. Right eye exhibits no discharge. Left eye exhibits no discharge.  Neck: Neck supple.  Cardiovascular: Normal rate, regular rhythm and normal heart sounds. Exam reveals no gallop and no friction rub.  No murmur heard. Pulmonary/Chest: Effort normal and breath sounds normal. No respiratory distress.  Abdominal: Soft. She exhibits no distension. There is no tenderness.  Musculoskeletal: She exhibits no edema or tenderness.  Neurological: She is alert.  Skin: Skin is warm and dry.  Psychiatric: She has a normal mood and affect. Her behavior is normal. Thought content normal.  Nursing note and vitals reviewed.    ED Treatments / Results  Labs (all labs ordered are listed, but only abnormal results are displayed) Labs Reviewed  CBC WITH DIFFERENTIAL/PLATELET - Abnormal; Notable for the following components:      Result Value   WBC 10.9 (*)    Lymphs Abs 4.5 (*)    All other components within normal limits  BASIC METABOLIC PANEL - Abnormal; Notable for the following components:   Glucose, Bld 200 (*)    All other components within normal limits  CBG MONITORING, ED -  Abnormal; Notable for the following components:   Glucose-Capillary 201 (*)    All other components within normal limits    EKG None  Radiology No results found.  Procedures Procedures (including critical care time)  Medications Ordered in ED Medications - No data to display   Initial Impression / Assessment and Plan / ED Course  I have reviewed the triage vital signs and the nursing notes.  Pertinent labs & imaging results that were available during my care of the patient were reviewed by me and considered in my medical decision making (see chart for details).     52 year old female with a syncopal event.  Hemodynamically stable. symptoms since resolved.   ED work-up fairly unremarkable.  I doubt emergent etiology.  Final Clinical Impressions(s) / ED Diagnoses   Final diagnoses:  Syncope, unspecified syncope type    ED Discharge Orders    None       Virgel Manifold, MD 10/13/17 1554

## 2017-10-10 NOTE — ED Triage Notes (Signed)
Pt was leaving the hospital after visiting mom here in the ED, pt was leaving and made it out to the parking lot when she told her daughter she doesn't feel well and needs to sit down, pt reports feeling hot and sweaty and sat down on the curb. When daughter return with the car her mom was lying on the side walk, daughter contacted ER staff who went to parking lot and got her onto a stretcher and brought to trauma C. On arrival to ED pt was pale but awake-pt confused about event. Pt progressively becoming more awake. Daughter at bedside.

## 2017-10-13 ENCOUNTER — Encounter (HOSPITAL_COMMUNITY): Payer: Self-pay

## 2017-10-13 ENCOUNTER — Encounter: Payer: Self-pay | Admitting: Radiology

## 2017-10-20 ENCOUNTER — Telehealth: Payer: Self-pay | Admitting: Family Medicine

## 2017-10-20 ENCOUNTER — Encounter: Payer: Medicaid Other | Admitting: Family Medicine

## 2017-10-20 NOTE — Telephone Encounter (Signed)
PCP Dr. Jeanie Cooks request copy of office visit note from today faxed to him at [phone].

## 2017-10-26 IMAGING — US US TRANSVAGINAL NON-OB
1 series · 13 of 25 positions shown · non-contrast
Comparison: None in PACs

CLINICAL DATA: Menorrhagia, regular menstrual cycles, onset of last
normal menstrual period was approximately 1 month ago. The patient
is on birth control pills to control bleeding.

EXAM:
TRANSABDOMINAL AND TRANSVAGINAL ULTRASOUND OF PELVIS
TECHNIQUE: Both transabdominal and transvaginal ultrasound examinations of the
pelvis were performed. Transabdominal technique was performed for
global imaging of the pelvis including uterus, ovaries, adnexal
regions, and pelvic cul-de-sac. It was necessary to proceed with
endovaginal exam following the transabdominal exam to visualize the
uterus, endometrium, ovaries, and adnexal regions..

[Series 1: us transvaginal non-ob · 0.24mm/px · 13 of 118 slices shown]
[im 1/118]
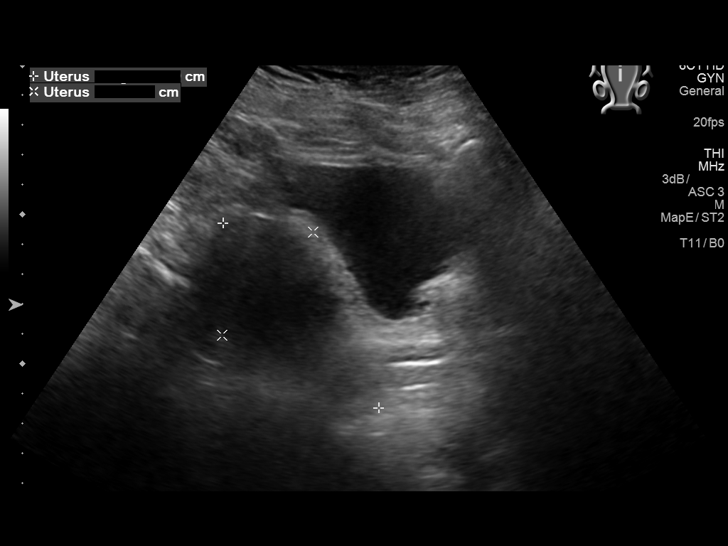
[im 10/118]
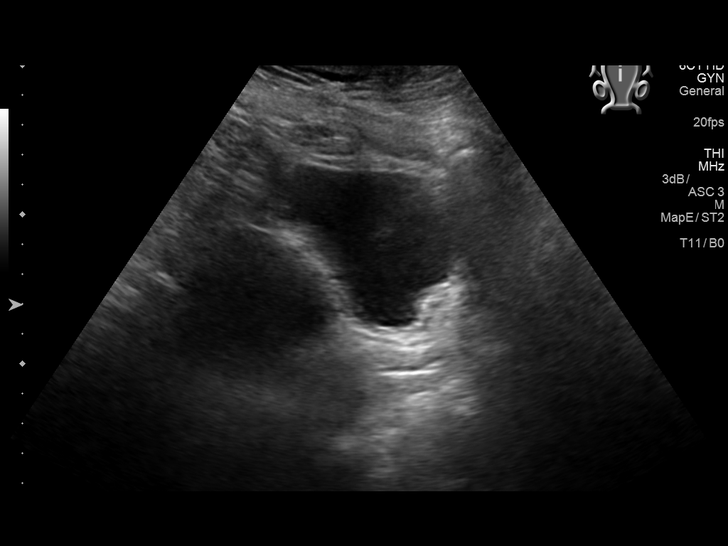
[im 20/118]
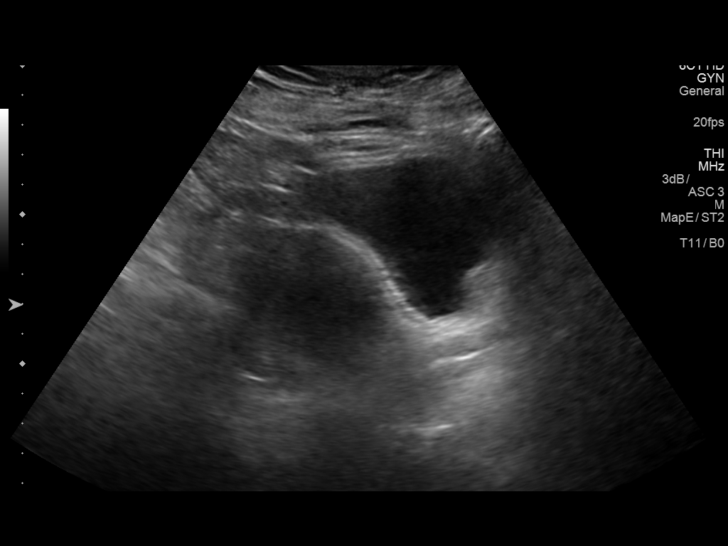
[im 30/118]
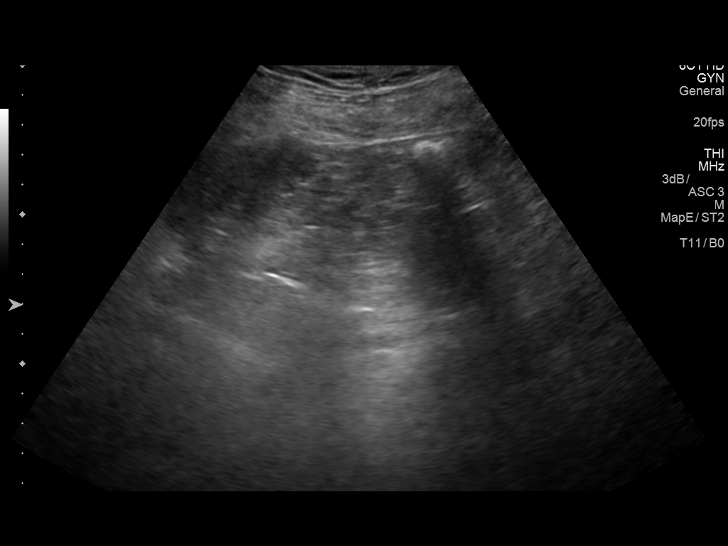
[im 40/118]
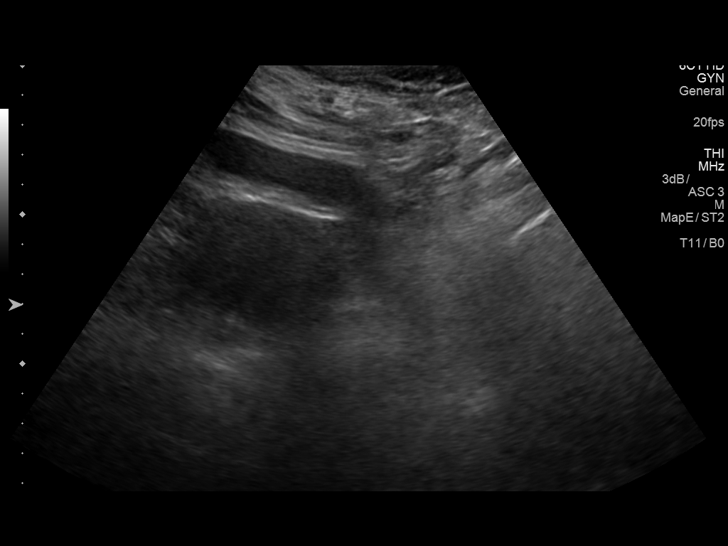
[im 49/118]
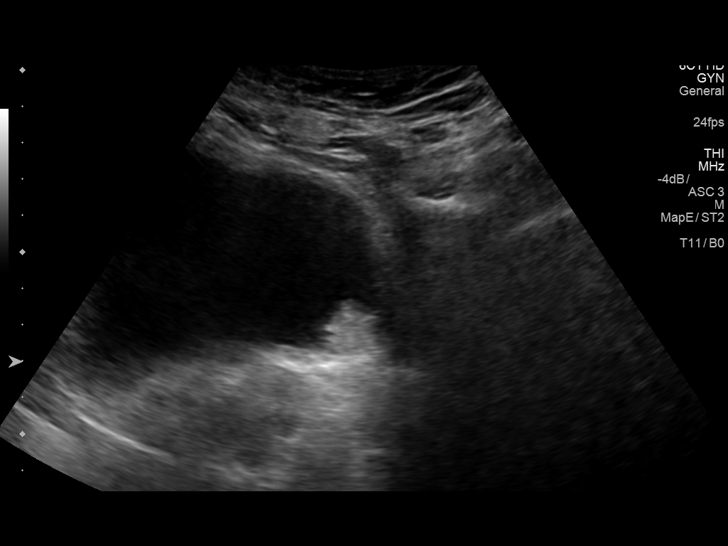
[im 59/118]
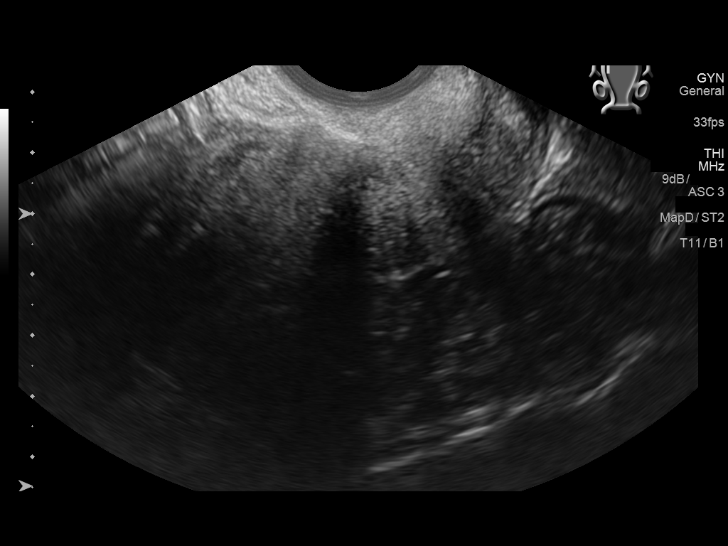
[im 69/118]
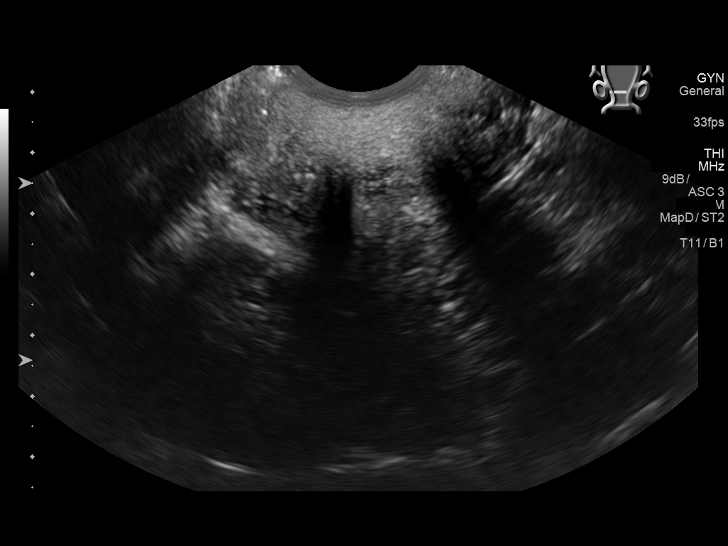
[im 79/118]
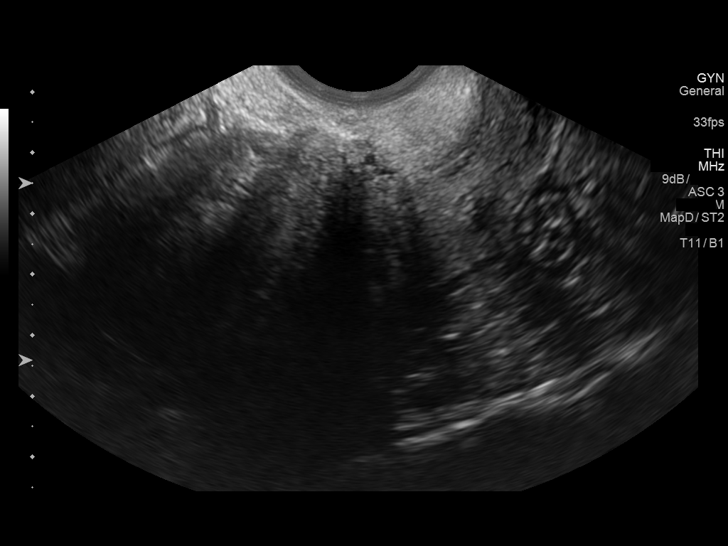
[im 88/118]
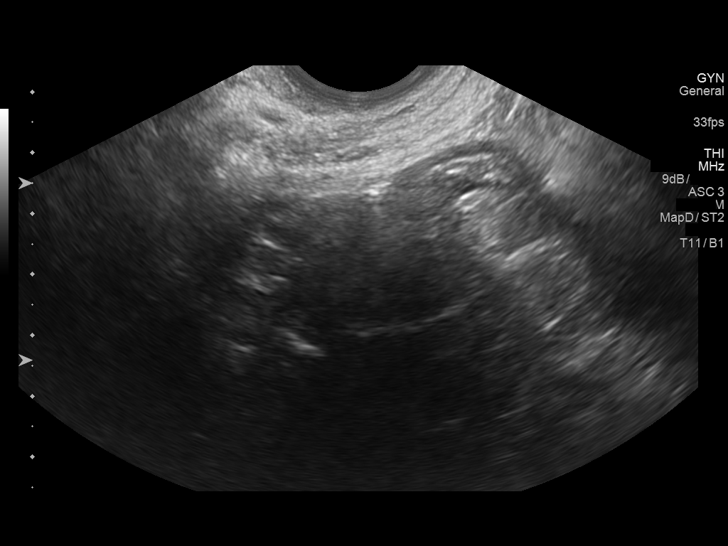
[im 98/118]
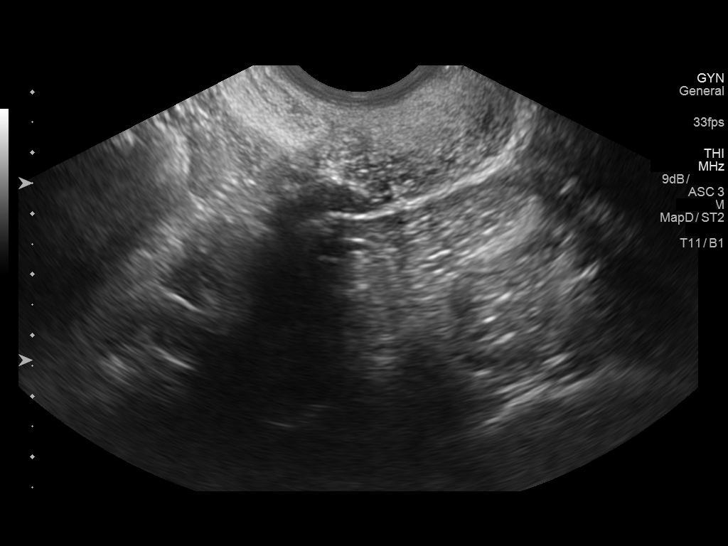
[im 108/118]
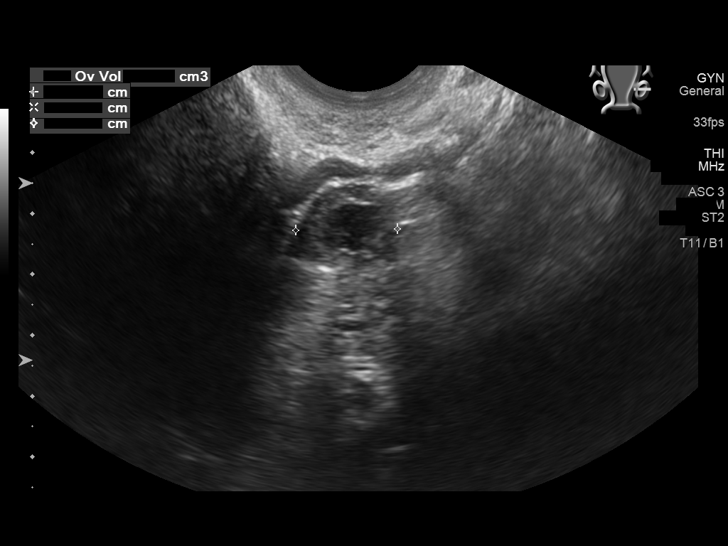
[im 118/118]
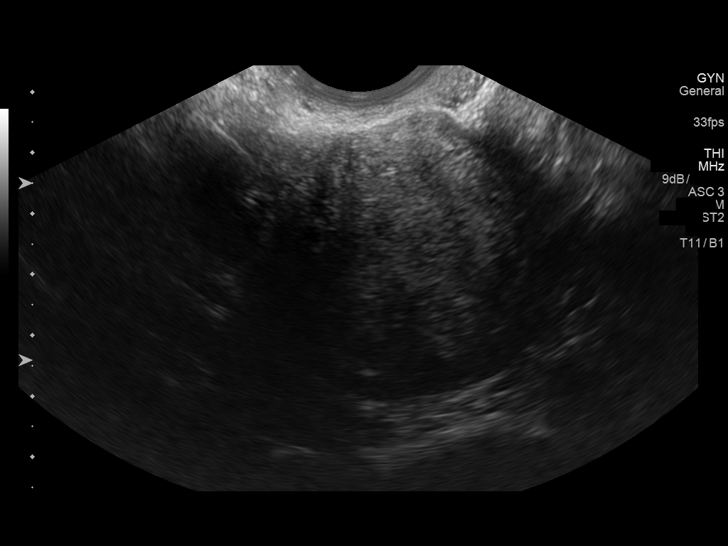

[13 of 25 positions shown; findings below may reference images not displayed]

FINDINGS: Uterus

Measurements: 7.5 x 4.6 x 4.6 cm. There is a right-sided posterior
fundal fibroid measuring 3.8 x 2.7 x 3.1 mm

Endometrium

Thickness: 4.2 mm. There is no focal abnormality. No abnormal
endometrial fluid collections are observed.

Right ovary

Measurements: 2.9 x 1.3 x 2 cm. Normal appearance/no adnexal mass.

Left ovary

Measurements: 2.7 x 3.1 x 1.7 cm. Normal appearance/no adnexal mass.

Other findings

No abnormal free fluid. Incidental note is made of a hyperechoic
mass arising from the mucosal surface of the urinary bladder. It
measures 1.8 x 1.3 x 1.9 cm
IMPRESSION: 1. Posterior right-sided fundal fibroid measuring 3.8 cm in greatest
dimension. Normal endometrial thickness and appearance. No abnormal
endometrial fluid collections. If bleeding remains unresponsive to
hormonal or medical therapy, sonohysterogram should be considered
for focal lesion work-up. (Ref: Radiological Reasoning: Algorithmic
Workup of Abnormal Vaginal Bleeding with Endovaginal Sonography and
Sonohysterography. AJR [id number]; 191:S68-73)
2. Normal appearance of the ovaries.  No free pelvic fluid.
3. Hyperechoic mass in the urinary bladder measuring 1.9 cm in
greatest dimension. Urologic consultation is recommended.
4. These results will be called to the ordering clinician or
representative by the Radiologist Assistant, and communication
documented in the PACS or zVision Dashboard.

## 2017-11-11 ENCOUNTER — Encounter: Payer: Medicare HMO | Admitting: Obstetrics and Gynecology

## 2017-11-12 ENCOUNTER — Encounter: Payer: Self-pay | Admitting: Obstetrics and Gynecology

## 2017-11-12 NOTE — Progress Notes (Signed)
Patient did not keep her GYN appointment for 11/11/2017.  Durene Romans MD Attending Center for [employer])

## 2017-11-17 DIAGNOSIS — E1142 Type 2 diabetes mellitus with diabetic polyneuropathy: Secondary | ICD-10-CM | POA: Diagnosis not present

## 2017-11-17 DIAGNOSIS — M545 Low back pain: Secondary | ICD-10-CM | POA: Diagnosis not present

## 2017-11-17 DIAGNOSIS — E1040 Type 1 diabetes mellitus with diabetic neuropathy, unspecified: Secondary | ICD-10-CM | POA: Diagnosis not present

## 2017-11-17 DIAGNOSIS — R69 Illness, unspecified: Secondary | ICD-10-CM | POA: Diagnosis not present

## 2017-11-21 DIAGNOSIS — R69 Illness, unspecified: Secondary | ICD-10-CM | POA: Diagnosis not present

## 2017-11-21 DIAGNOSIS — J309 Allergic rhinitis, unspecified: Secondary | ICD-10-CM | POA: Diagnosis not present

## 2017-11-21 DIAGNOSIS — G3184 Mild cognitive impairment, so stated: Secondary | ICD-10-CM | POA: Diagnosis not present

## 2017-11-21 DIAGNOSIS — E1165 Type 2 diabetes mellitus with hyperglycemia: Secondary | ICD-10-CM | POA: Diagnosis not present

## 2017-11-21 DIAGNOSIS — G8929 Other chronic pain: Secondary | ICD-10-CM | POA: Diagnosis not present

## 2017-11-21 DIAGNOSIS — I10 Essential (primary) hypertension: Secondary | ICD-10-CM | POA: Diagnosis not present

## 2017-11-21 DIAGNOSIS — E1142 Type 2 diabetes mellitus with diabetic polyneuropathy: Secondary | ICD-10-CM | POA: Diagnosis not present

## 2017-12-05 DIAGNOSIS — R69 Illness, unspecified: Secondary | ICD-10-CM | POA: Diagnosis not present

## 2017-12-19 DIAGNOSIS — Z1239 Encounter for other screening for malignant neoplasm of breast: Secondary | ICD-10-CM | POA: Diagnosis not present

## 2017-12-19 DIAGNOSIS — E1142 Type 2 diabetes mellitus with diabetic polyneuropathy: Secondary | ICD-10-CM | POA: Diagnosis not present

## 2017-12-19 DIAGNOSIS — Z1211 Encounter for screening for malignant neoplasm of colon: Secondary | ICD-10-CM | POA: Diagnosis not present

## 2017-12-19 DIAGNOSIS — R69 Illness, unspecified: Secondary | ICD-10-CM | POA: Diagnosis not present

## 2017-12-19 DIAGNOSIS — M545 Low back pain: Secondary | ICD-10-CM | POA: Diagnosis not present

## 2017-12-19 DIAGNOSIS — E2839 Other primary ovarian failure: Secondary | ICD-10-CM | POA: Diagnosis not present

## 2017-12-19 DIAGNOSIS — J449 Chronic obstructive pulmonary disease, unspecified: Secondary | ICD-10-CM | POA: Diagnosis not present

## 2017-12-19 DIAGNOSIS — Z23 Encounter for immunization: Secondary | ICD-10-CM | POA: Diagnosis not present

## 2017-12-19 DIAGNOSIS — Z716 Tobacco abuse counseling: Secondary | ICD-10-CM | POA: Diagnosis not present

## 2018-01-01 ENCOUNTER — Encounter: Payer: Self-pay | Admitting: Gastroenterology

## 2018-01-01 DIAGNOSIS — Z1211 Encounter for screening for malignant neoplasm of colon: Secondary | ICD-10-CM | POA: Diagnosis not present

## 2018-01-01 DIAGNOSIS — Z1212 Encounter for screening for malignant neoplasm of rectum: Secondary | ICD-10-CM | POA: Diagnosis not present

## 2018-01-01 LAB — COLOGUARD

## 2018-01-15 DIAGNOSIS — Z1211 Encounter for screening for malignant neoplasm of colon: Secondary | ICD-10-CM | POA: Diagnosis not present

## 2018-01-15 DIAGNOSIS — E1142 Type 2 diabetes mellitus with diabetic polyneuropathy: Secondary | ICD-10-CM | POA: Diagnosis not present

## 2018-01-15 DIAGNOSIS — M545 Low back pain: Secondary | ICD-10-CM | POA: Diagnosis not present

## 2018-01-15 DIAGNOSIS — J449 Chronic obstructive pulmonary disease, unspecified: Secondary | ICD-10-CM | POA: Diagnosis not present

## 2018-01-26 ENCOUNTER — Encounter: Payer: Self-pay | Admitting: Gastroenterology

## 2018-02-03 DIAGNOSIS — E119 Type 2 diabetes mellitus without complications: Secondary | ICD-10-CM | POA: Diagnosis not present

## 2018-02-03 DIAGNOSIS — H52203 Unspecified astigmatism, bilateral: Secondary | ICD-10-CM | POA: Diagnosis not present

## 2018-02-03 DIAGNOSIS — Z7984 Long term (current) use of oral hypoglycemic drugs: Secondary | ICD-10-CM | POA: Diagnosis not present

## 2018-02-03 DIAGNOSIS — H5213 Myopia, bilateral: Secondary | ICD-10-CM | POA: Diagnosis not present

## 2018-02-03 DIAGNOSIS — Z0100 Encounter for examination of eyes and vision without abnormal findings: Secondary | ICD-10-CM | POA: Diagnosis not present

## 2018-02-03 DIAGNOSIS — H524 Presbyopia: Secondary | ICD-10-CM | POA: Diagnosis not present

## 2018-02-11 DIAGNOSIS — R69 Illness, unspecified: Secondary | ICD-10-CM | POA: Diagnosis not present

## 2018-03-02 ENCOUNTER — Encounter: Payer: Medicare HMO | Admitting: Gastroenterology

## 2018-03-04 DIAGNOSIS — R69 Illness, unspecified: Secondary | ICD-10-CM | POA: Diagnosis not present

## 2018-03-06 DIAGNOSIS — E1142 Type 2 diabetes mellitus with diabetic polyneuropathy: Secondary | ICD-10-CM | POA: Diagnosis not present

## 2018-03-06 DIAGNOSIS — J449 Chronic obstructive pulmonary disease, unspecified: Secondary | ICD-10-CM | POA: Diagnosis not present

## 2018-03-06 DIAGNOSIS — M545 Low back pain: Secondary | ICD-10-CM | POA: Diagnosis not present

## 2018-03-23 ENCOUNTER — Telehealth: Payer: Self-pay | Admitting: *Deleted

## 2018-03-23 NOTE — Telephone Encounter (Signed)
PATIENT NO SHOWED FOR APPOINTMENT. PATIENT STATING SHE THOUGHT SHE COULD JUST SHOW UP AT ANY TIME TODAY. PATIENT RESCHEDULED FOR 04/02/18 IN ROOM 52 AT 11 AM. PATIENT INSTRUCTED THAT DAY,04/02/18, TO STOP EATING FOODS AS DESCRIBED ON PREVISIT FORM. PATIENT VERBALIZED UNDERSTANDING.

## 2018-04-02 ENCOUNTER — Ambulatory Visit (AMBULATORY_SURGERY_CENTER): Payer: Self-pay

## 2018-04-02 VITALS — Ht 61.0 in | Wt 152.8 lb

## 2018-04-02 DIAGNOSIS — R195 Other fecal abnormalities: Secondary | ICD-10-CM

## 2018-04-02 MED ORDER — NA SULFATE-K SULFATE-MG SULF 17.5-3.13-1.6 GM/177ML PO SOLN: 1.0000 | Freq: Once | ORAL | 0 refills | Status: AC

## 2018-04-02 NOTE — Progress Notes (Signed)
Per pt, no allergies to soy or egg products.Pt not taking any weight loss meds or using  O2 at home.  Pt refused emmi video. 

## 2018-04-07 ENCOUNTER — Encounter: Payer: Medicare HMO | Admitting: Gastroenterology

## 2018-04-20 ENCOUNTER — Encounter: Payer: Medicare HMO | Admitting: Gastroenterology

## 2018-04-30 ENCOUNTER — Encounter: Payer: Medicare HMO | Admitting: Gastroenterology

## 2018-05-04 ENCOUNTER — Encounter: Payer: Medicare HMO | Admitting: Gastroenterology

## 2018-05-06 ENCOUNTER — Encounter: Payer: Self-pay | Admitting: Gastroenterology

## 2018-05-06 ENCOUNTER — Ambulatory Visit (AMBULATORY_SURGERY_CENTER): Payer: Medicare HMO | Admitting: Gastroenterology

## 2018-05-06 VITALS — BP 125/79 | HR 76 | Temp 97.1°F | Resp 18 | Ht 61.0 in | Wt 152.0 lb

## 2018-05-06 DIAGNOSIS — D125 Benign neoplasm of sigmoid colon: Secondary | ICD-10-CM

## 2018-05-06 DIAGNOSIS — R195 Other fecal abnormalities: Secondary | ICD-10-CM | POA: Diagnosis not present

## 2018-05-06 DIAGNOSIS — D127 Benign neoplasm of rectosigmoid junction: Secondary | ICD-10-CM | POA: Diagnosis not present

## 2018-05-06 DIAGNOSIS — D129 Benign neoplasm of anus and anal canal: Secondary | ICD-10-CM

## 2018-05-06 DIAGNOSIS — D124 Benign neoplasm of descending colon: Secondary | ICD-10-CM

## 2018-05-06 DIAGNOSIS — D128 Benign neoplasm of rectum: Secondary | ICD-10-CM

## 2018-05-06 DIAGNOSIS — K635 Polyp of colon: Secondary | ICD-10-CM

## 2018-05-06 MED ORDER — SODIUM CHLORIDE 0.9 % IV SOLN: 500.0000 mL | Freq: Once | INTRAVENOUS | Status: DC

## 2018-05-06 NOTE — Op Note (Signed)
[facility] Patient Name: Brandi Calderon Procedure Date: 05/06/2018 11:20 AM MRN: [id number] Endoscopist: Thornton Park MD, MD Age: 53 Referring MD:  Date of Birth: [birth year] Gender: Female Account #: [id number] Procedure:                Colonoscopy Indications:              This is the patient's first colonoscopy, Positive                            Cologuard test. No known family history of colon                            cancer or polyps. Medicines:                See the Anesthesia note for documentation of the                            administered medications Procedure:                Pre-Anesthesia Assessment:                           - Prior to the procedure, a History and Physical                            was performed, and patient medications and                            allergies were reviewed. The patient's tolerance of                            previous anesthesia was also reviewed. The risks                            and benefits of the procedure and the sedation                            options and risks were discussed with the patient.                            All questions were answered, and informed consent                            was obtained. Prior Anticoagulants: The patient has                            taken no previous anticoagulant or antiplatelet                            agents. ASA Grade Assessment: II - A patient with                            mild systemic disease. After reviewing the risks  and benefits, the patient was deemed in                            satisfactory condition to undergo the procedure.                           After obtaining informed consent, the colonoscope                            was passed under direct vision. Throughout the                            procedure, the patient's blood pressure, pulse, and                            oxygen saturations were monitored  continuously. The                            Colonoscope was introduced through the anus and                            advanced to the the terminal ileum, with                            identification of the appendiceal orifice and IC                            valve. The colonoscopy was performed without                            difficulty. The patient tolerated the procedure                            well. The quality of the bowel preparation was                            good. The terminal ileum, ileocecal valve,                            appendiceal orifice, and rectum were photographed. Scope In: 11:30:19 AM Scope Out: 11:54:27 AM Scope Withdrawal Time: 0 hours 15 minutes 37 seconds  Total Procedure Duration: 0 hours 24 minutes 8 seconds  Findings:                 The perianal and digital rectal examinations were                            normal.                           A 11 mm polyp was found in the descending colon.                            The polyp was semi-pedunculated. The polyp was  removed with a hot snare. Resection and retrieval                            were complete. Estimated blood loss: none.                           A 4 mm polyp was found in the sigmoid colon. The                            polyp was sessile. The polyp was removed with a                            cold snare. Resection and retrieval were complete.                           A 3 mm polyp was found in the rectum. The polyp was                            sessile. The polyp was removed with a cold snare.                            Resection and retrieval were complete. Estimated                            blood loss: none.                           The exam was otherwise without abnormality on                            direct and retroflexion views. Complications:            No immediate complications. Estimated blood loss:                             Minimal. Estimated Blood Loss:     Estimated blood loss was minimal. Impression:               - One 11 mm polyp in the descending colon, removed                            with a hot snare. Resected and retrieved.                           - One 4 mm polyp in the sigmoid colon, removed with                            a cold snare. Resected and retrieved.                           - One 3 mm polyp in the rectum, removed with a cold  snare. Resected and retrieved.                           - The examination was otherwise normal on direct                            and retroflexion views. Recommendation:           - Patient has a contact number available for                            emergencies. The signs and symptoms of potential                            delayed complications were discussed with the                            patient. Return to normal activities tomorrow.                            Written discharge instructions were provided to the                            patient.                           - Resume regular diet.                           - Continue present medications.                           - Await pathology results.                           - Repeat colonoscopy in 3 years for surveillance if                            all three polyps and/or the largest polyp is an                            adenoma. Otherwise, surveillance colonoscopy in 5                            years. Thornton Park MD, MD 05/06/2018 12:04:38 PM This report has been signed electronically.

## 2018-05-06 NOTE — Progress Notes (Signed)
Called to room to assist during endoscopic procedure.  Patient ID and intended procedure confirmed with present staff. Received instructions for my participation in the procedure from the performing physician.  

## 2018-05-06 NOTE — Patient Instructions (Signed)
YOU HAD AN ENDOSCOPIC PROCEDURE TODAY AT [facility]:   Refer to the procedure report that was given to you for any specific questions about what was found during the examination.  If the procedure report does not answer your questions, please call your gastroenterologist to clarify.  If you requested that your care partner not be given the details of your procedure findings, then the procedure report has been included in a sealed envelope for you to review at your convenience later.  YOU SHOULD EXPECT: Some feelings of bloating in the abdomen. Passage of more gas than usual.  Walking can help get rid of the air that was put into your GI tract during the procedure and reduce the bloating. If you had a lower endoscopy (such as a colonoscopy or flexible sigmoidoscopy) you may notice spotting of blood in your stool or on the toilet paper. If you underwent a bowel prep for your procedure, you may not have a normal bowel movement for a few days.  Please Note:  You might notice some irritation and congestion in your nose or some drainage.  This is from the oxygen used during your procedure.  There is no need for concern and it should clear up in a day or so.  SYMPTOMS TO REPORT IMMEDIATELY:   Following lower endoscopy (colonoscopy or flexible sigmoidoscopy):  Excessive amounts of blood in the stool  Significant tenderness or worsening of abdominal pains  Swelling of the abdomen that is new, acute  Fever of 100F or higher  For urgent or emergent issues, a gastroenterologist can be reached at any hour by calling [phone].   DIET:  We do recommend a small meal at first, but then you may proceed to your regular diet.  Drink plenty of fluids but you should avoid alcoholic beverages for 24 hours.  ACTIVITY:  You should plan to take it easy for the rest of today and you should NOT DRIVE or use heavy machinery until tomorrow (because of the sedation medicines used during the test).     FOLLOW UP: Our staff will call the number listed on your records the next business day following your procedure to check on you and address any questions or concerns that you may have regarding the information given to you following your procedure. If we do not reach you, we will leave a message.  However, if you are feeling well and you are not experiencing any problems, there is no need to return our call.  We will assume that you have returned to your regular daily activities without incident.  If any biopsies were taken you will be contacted by phone or by letter within the next 1-3 weeks.  Please call us at [phone] if you have not heard about the biopsies in 3 weeks.    SIGNATURES/CONFIDENTIALITY: You and/or your care partner have signed paperwork which will be entered into your electronic medical record.  These signatures attest to the fact that that the information above on your After Visit Summary has been reviewed and is understood.  Full responsibility of the confidentiality of this discharge information lies with you and/or your care-partner.    Handout was given to your care partner on polyps. Your blood sugar was 124 in the recovery room. You may resume your current medications today. Await biopsy results. Please call if any questions or concerns.

## 2018-05-06 NOTE — Progress Notes (Signed)
PT taken to PACU. Monitors in place. VSS. Report given to RN. 

## 2018-05-06 NOTE — Progress Notes (Signed)
No problems noted in the recovery room. maw 

## 2018-05-07 ENCOUNTER — Telehealth: Payer: Self-pay

## 2018-05-07 NOTE — Telephone Encounter (Signed)
  Follow up Call-  Call back number 05/06/2018  Post procedure Call Back phone  # [phone]  Permission to leave phone message Yes  Some recent data might be hidden     Patient questions:  Do you have a fever, pain , or abdominal swelling? No. Pain Score  0 *  Have you tolerated food without any problems? Yes.    Have you been able to return to your normal activities? Yes.    Do you have any questions about your discharge instructions: Diet   No. Medications  No. Follow up visit  No.  Do you have questions or concerns about your Care? No.  Actions: * If pain score is 4 or above: No action needed, pain <4.

## 2018-05-08 ENCOUNTER — Encounter: Payer: Self-pay | Admitting: Gastroenterology

## 2018-05-11 DIAGNOSIS — J449 Chronic obstructive pulmonary disease, unspecified: Secondary | ICD-10-CM | POA: Diagnosis not present

## 2018-05-11 DIAGNOSIS — H1033 Unspecified acute conjunctivitis, bilateral: Secondary | ICD-10-CM | POA: Diagnosis not present

## 2018-05-11 DIAGNOSIS — M545 Low back pain: Secondary | ICD-10-CM | POA: Diagnosis not present

## 2018-05-11 DIAGNOSIS — R69 Illness, unspecified: Secondary | ICD-10-CM | POA: Diagnosis not present

## 2018-05-11 DIAGNOSIS — Z716 Tobacco abuse counseling: Secondary | ICD-10-CM | POA: Diagnosis not present

## 2018-05-11 DIAGNOSIS — E1142 Type 2 diabetes mellitus with diabetic polyneuropathy: Secondary | ICD-10-CM | POA: Diagnosis not present

## 2018-05-20 ENCOUNTER — Encounter: Payer: Self-pay | Admitting: Emergency Medicine

## 2018-05-20 ENCOUNTER — Ambulatory Visit
Admission: EM | Admit: 2018-05-20 | Discharge: 2018-05-20 | Disposition: A | Discharge status: Home / Self Care | Payer: Medicare HMO | Attending: Family Medicine | Admitting: Family Medicine

## 2018-05-20 ENCOUNTER — Other Ambulatory Visit: Payer: Self-pay

## 2018-05-20 DIAGNOSIS — F17210 Nicotine dependence, cigarettes, uncomplicated: Secondary | ICD-10-CM | POA: Diagnosis not present

## 2018-05-20 DIAGNOSIS — R69 Illness, unspecified: Secondary | ICD-10-CM | POA: Diagnosis not present

## 2018-05-20 DIAGNOSIS — L03031 Cellulitis of right toe: Secondary | ICD-10-CM | POA: Diagnosis not present

## 2018-05-20 DIAGNOSIS — E119 Type 2 diabetes mellitus without complications: Secondary | ICD-10-CM | POA: Diagnosis not present

## 2018-05-20 MED ORDER — MUPIROCIN 2 % EX OINT: 1.0000 "application " | TOPICAL_OINTMENT | Freq: Two times a day (BID) | CUTANEOUS | 0 refills | Status: AC

## 2018-05-20 MED ORDER — DOXYCYCLINE HYCLATE 100 MG PO CAPS: 100.0000 mg | ORAL_CAPSULE | Freq: Two times a day (BID) | ORAL | 0 refills | Status: DC

## 2018-05-20 NOTE — ED Provider Notes (Signed)
[facility]    CSN: [id number] Arrival date & time: 05/20/18  1047  History   Chief Complaint Chief Complaint  Patient presents with  . Toe Pain   HPI  53 year old female with the below mentioned past medical history presents with toe pain.  Patient reports a one-month history of right third toe pain.  Patient states that she cut her nail and then subsequently developed the pain.  She has mild swelling.  No open wound.  No medications or interventions tried other than cutting her nail. Tender to palpation.  Worse when she applies pressure.  No known relieving factors.  No other complaints.  PMH, Surgical Hx, Family Hx, Social History reviewed and updated as below.  Past Medical History:  Diagnosis Date  . Arthritis   . Back pain   . COPD (chronic obstructive pulmonary disease) ([place])   . Depression   . Diabetes mellitus   . Diabetes mellitus without complication ([place])   . Headache   . Hyperlipidemia   . Hypertension   . Tendonitis    Patient Active Problem List   Diagnosis Date Noted  . Foot pain, right 04/30/2016  . Depression, major, single episode, moderate ([place]) 03/11/2016  . Neuropathy due to type 2 diabetes mellitus ([place]) - severe 12/19/2015  . Menorrhagia 09/28/2015  . Type 2 diabetes mellitus treated with insulin ([place]) 05/24/2015  . Back pain 05/24/2015   Past Surgical History:  Procedure Laterality Date  . CESAREAN SECTION    . CESAREAN SECTION     3 times  . cyst on neck     as a baby/right side   OB History   No obstetric history on file.    Home Medications    Prior to Admission medications   Medication Sig Start Date End Date Taking? Authorizing Provider  Albuterol Sulfate 108 (90 Base) MCG/ACT AEPB Inhale into the lungs. Use 2 puffs 4 times a day prn   Yes [provider]  Aspirin-Salicylamide-Caffeine (BC HEADACHE POWDER PO) Take 4 packets by mouth daily as needed (pain).   Yes [provider]  citalopram (CELEXA)  20 MG tablet Take 40 mg by mouth 2 (two) times daily.    Yes [provider]  clonazePAM (KLONOPIN) 0.5 MG tablet Take 0.5 mg by mouth 2 (two) times daily as needed for anxiety (Sometimes takes 3 tablets a day).    Yes [provider]  cyclobenzaprine (FLEXERIL) 10 MG tablet Take 1 tablet (10 mg total) by mouth daily. Patient taking differently: Take 10 mg by mouth at bedtime.  12/25/16  Yes Tawni Millers, MD  dapagliflozin propanediol (FARXIGA) 5 MG TABS tablet Take 10 mg by mouth daily. 12/25/16  Yes Tawni Millers, MD  Dulaglutide (TRULICITY) 2.70 WC/3.7SE SOPN Inject into the skin once a week.   Yes [provider]  Fluticasone Propionate (FLONASE NA) Place into the nose as needed. Use 2 spray in each nostril prn   Yes [provider]  gabapentin (NEURONTIN) 600 MG tablet TAKE TWO TABLETS (1200 MG) BY MOUTH 3 TIMES A DAY Patient taking differently: Take one pill tid 07/23/17  Yes Tawni Millers, MD  ibuprofen (ADVIL,MOTRIN) 800 MG tablet Take 1 tablet (800 mg total) by mouth every 8 (eight) hours as needed. 02/13/16  Yes Birdie Sons, MD  medroxyPROGESTERone (PROVERA) 10 MG tablet Take 1 tablet (10 mg total) by mouth daily. 09/18/16  Yes Tawni Millers, MD  metFORMIN (GLUCOPHAGE) 1000 MG tablet TAKE ONE  TABLET BY MOUTH 2 TIMES A DAY WITH A MEAL 08/04/17  Yes Doles-Johnson, Teah, NP  Omega-3 Fatty Acids (FISH OIL) 1000 MG CAPS Take 1 capsule (1,000 mg total) by mouth daily. Reported on 09/19/2015 06/26/16  Yes Tawni Millers, MD  simvastatin (ZOCOR) 20 MG tablet TAKE ONE TABLET BY MOUTH EVERY DAY Patient taking differently: at bedtime.  08/04/17  Yes Tawni Millers, MD  sitaGLIPtin (JANUVIA) 100 MG tablet Take 100 mg by mouth daily.   Yes [provider]  traMADol (ULTRAM) 50 MG tablet Take by mouth every 6 (six) hours as needed.   Yes [provider]  doxycycline (VIBRAMYCIN) 100 MG capsule Take 1 capsule (100 mg total) by mouth 2 (two) times  daily. 05/20/18   Coral Spikes, DO  mupirocin ointment (BACTROBAN) 2 % Apply 1 application topically 2 (two) times daily for 7 days. 05/20/18 05/27/18  Coral Spikes, DO    Family History Family History  Problem Relation Age of Onset  . Kidney disease Father   . Diabetes Father   . Dementia Father   . Diabetes Sister   . Diabetes Brother   . Diabetes Mother   . Diabetes Brother     Social History Social History   Tobacco Use  . Smoking status: Current Every Day Smoker    Packs/day: 1.00    Years: 33.00    Pack years: 33.00    Types: Cigarettes  . Smokeless tobacco: Never Used  Substance Use Topics  . Alcohol use: No  . Drug use: No     Allergies   Penicillins; Penicillins; Vicodin [hydrocodone-acetaminophen]; Victoza [liraglutide]; and Vicodin [hydrocodone-acetaminophen]   Review of Systems Review of Systems  Constitutional: Negative for fever.  Musculoskeletal:       Right 3rd toe pain.   Physical Exam Triage Vital Signs ED Triage Vitals  Enc Vitals Group     BP 05/20/18 1122 124/73     Pulse Rate 05/20/18 1122 62     Resp 05/20/18 1122 18     Temp 05/20/18 1122 98.2 F (36.8 C)     Temp Source 05/20/18 1122 Oral     SpO2 05/20/18 1122 100 %     Weight 05/20/18 1119 152 lb (68.9 kg)     Height 05/20/18 1119 5\' 1"  (1.549 m)     Head Circumference --      Peak Flow --      Pain Score 05/20/18 1119 7     Pain Loc --      Pain Edu? --      Excl. in [place]? --    Updated Vital Signs BP 124/73 (BP Location: Left Arm)   Pulse 62   Temp 98.2 F (36.8 C) (Oral)   Resp 18   Ht 5\' 1"  (1.549 m)   Wt 68.9 kg   SpO2 100%   BMI 28.72 kg/m   Visual Acuity Right Eye Distance:   Left Eye Distance:   Bilateral Distance:    Right Eye Near:   Left Eye Near:    Bilateral Near:     Physical Exam Vitals signs and nursing note reviewed.  Constitutional:      Appearance: Normal appearance.  HENT:     Head: Normocephalic and atraumatic.     Nose: Nose normal.    Eyes:     General: No scleral icterus.       Right eye: No discharge.        Left eye: No  discharge.     Conjunctiva/sclera: Conjunctivae normal.  Pulmonary:     Effort: Pulmonary effort is normal. No respiratory distress.  Musculoskeletal:     Comments: Right 3rd toe -medial nail bed with evidence of paronychia.  Tender to palpation.  Mild erythema  Skin:    General: Skin is warm.     Capillary Refill: Capillary refill takes less than 2 seconds.  Neurological:     Mental Status: She is alert.  Psychiatric:        Mood and Affect: Mood normal.        Behavior: Behavior normal.    UC Treatments / Results  Labs (all labs ordered are listed, but only abnormal results are displayed) Labs Reviewed - No data to display  EKG None  Radiology No results found.  Procedures Procedures (including critical care time)  Medications Ordered in UC Medications - No data to display  Initial Impression / Assessment and Plan / UC Course  I have reviewed the triage vital signs and the nursing notes.  Pertinent labs & imaging results that were available during my care of the patient were reviewed by me and considered in my medical decision making (see chart for details).    53 year old female presents with paronychia.  Patient declined drainage.  Treating with Bactroban and doxycycline.  Warm soaks.  Final Clinical Impressions(s) / UC Diagnoses   Final diagnoses:  Paronychia of toe, right   Discharge Instructions   None    ED Prescriptions    Medication Sig Dispense Auth. Provider   doxycycline (VIBRAMYCIN) 100 MG capsule Take 1 capsule (100 mg total) by mouth 2 (two) times daily. 14 capsule Shanikka Wonders G, DO   mupirocin ointment (BACTROBAN) 2 % Apply 1 application topically 2 (two) times daily for 7 days. 22 g Coral Spikes, DO     Controlled Substance Prescriptions [place] Controlled Substance Registry consulted? Not Applicable   Coral Spikes, DO 05/20/18 1221

## 2018-05-20 NOTE — ED Triage Notes (Signed)
Patient states she cut her right middle toenail about 1 month ago and states her toe has been sore since then. She states she is a diabetic.

## 2018-05-30 DIAGNOSIS — R69 Illness, unspecified: Secondary | ICD-10-CM | POA: Diagnosis not present

## 2018-06-05 DIAGNOSIS — M545 Low back pain: Secondary | ICD-10-CM | POA: Diagnosis not present

## 2018-06-05 DIAGNOSIS — E1142 Type 2 diabetes mellitus with diabetic polyneuropathy: Secondary | ICD-10-CM | POA: Diagnosis not present

## 2018-06-05 DIAGNOSIS — J449 Chronic obstructive pulmonary disease, unspecified: Secondary | ICD-10-CM | POA: Diagnosis not present

## 2018-06-05 DIAGNOSIS — L03039 Cellulitis of unspecified toe: Secondary | ICD-10-CM | POA: Diagnosis not present

## 2018-06-12 DIAGNOSIS — L03039 Cellulitis of unspecified toe: Secondary | ICD-10-CM | POA: Diagnosis not present

## 2018-06-12 DIAGNOSIS — E1142 Type 2 diabetes mellitus with diabetic polyneuropathy: Secondary | ICD-10-CM | POA: Diagnosis not present

## 2018-06-12 DIAGNOSIS — J449 Chronic obstructive pulmonary disease, unspecified: Secondary | ICD-10-CM | POA: Diagnosis not present

## 2018-06-25 ENCOUNTER — Other Ambulatory Visit: Payer: Self-pay

## 2018-06-25 ENCOUNTER — Ambulatory Visit
Payer: Medicare HMO | Attending: Student in an Organized Health Care Education/Training Program | Admitting: Student in an Organized Health Care Education/Training Program

## 2018-06-25 ENCOUNTER — Encounter: Payer: Self-pay | Admitting: Student in an Organized Health Care Education/Training Program

## 2018-06-25 VITALS — BP 133/81 | HR 70 | Temp 98.4°F | Resp 18 | Ht 61.0 in | Wt 150.0 lb

## 2018-06-25 DIAGNOSIS — E1140 Type 2 diabetes mellitus with diabetic neuropathy, unspecified: Secondary | ICD-10-CM | POA: Diagnosis not present

## 2018-06-25 DIAGNOSIS — R69 Illness, unspecified: Secondary | ICD-10-CM | POA: Diagnosis not present

## 2018-06-25 DIAGNOSIS — M5442 Lumbago with sciatica, left side: Secondary | ICD-10-CM | POA: Insufficient documentation

## 2018-06-25 DIAGNOSIS — M5136 Other intervertebral disc degeneration, lumbar region: Secondary | ICD-10-CM | POA: Diagnosis present

## 2018-06-25 DIAGNOSIS — G8929 Other chronic pain: Secondary | ICD-10-CM | POA: Diagnosis present

## 2018-06-25 DIAGNOSIS — Z79899 Other long term (current) drug therapy: Secondary | ICD-10-CM | POA: Diagnosis present

## 2018-06-25 DIAGNOSIS — F329 Major depressive disorder, single episode, unspecified: Secondary | ICD-10-CM | POA: Insufficient documentation

## 2018-06-25 DIAGNOSIS — F419 Anxiety disorder, unspecified: Secondary | ICD-10-CM | POA: Diagnosis present

## 2018-06-25 DIAGNOSIS — F321 Major depressive disorder, single episode, moderate: Secondary | ICD-10-CM | POA: Diagnosis not present

## 2018-06-25 DIAGNOSIS — M5441 Lumbago with sciatica, right side: Secondary | ICD-10-CM | POA: Insufficient documentation

## 2018-06-25 DIAGNOSIS — F32A Depression, unspecified: Secondary | ICD-10-CM

## 2018-06-25 DIAGNOSIS — M533 Sacrococcygeal disorders, not elsewhere classified: Secondary | ICD-10-CM | POA: Insufficient documentation

## 2018-06-25 DIAGNOSIS — G894 Chronic pain syndrome: Secondary | ICD-10-CM

## 2018-06-25 NOTE — Progress Notes (Signed)
Patient's Name: Brandi Calderon  MRN: [id number]  Referring Provider: Nolene Ebbs, MD  DOB: 1965-11-03  PCP: [facility]  DOS: 06/25/2018  Note by: Gillis Santa, MD  Service setting: Ambulatory outpatient  Specialty: Interventional Pain Management  Location: [facility] (AMB) Pain Management Facility  Visit type: Initial Patient Evaluation  Patient type: New Patient   Primary Reason(s) for Visit: Encounter for initial evaluation of one or more chronic problems (new to examiner) potentially causing chronic pain, and posing a threat to normal musculoskeletal function. (Level of risk: High) CC: Back Pain and Toe Pain (right middle)  HPI  Brandi Calderon is a 53 y.o. year old, female patient, who comes today to see [place] for the first time for an initial evaluation of her chronic pain. 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 Today she comes in for evaluation of her Back Pain and Toe Pain (right middle)  Pain Assessment: Location: Lower Back Radiating: radiates down the back of both legs to feet Onset: More than a month ago Duration: Chronic pain Quality: Throbbing, Constant Severity: 10-Worst pain ever/10 (subjective, self-reported pain score)  Note: Reported level is inconsistent with clinical observations. Clinically the patient looks like a 3/10 A 3/10 is viewed as "Moderate" and described as significantly interfering with activities of daily living (ADL). It becomes difficult to feed, bathe, get dressed, get on and off the toilet or to perform personal hygiene functions. Difficult to get in and out of bed or a chair without assistance. Very distracting. With effort, it can be ignored when deeply involved in activities. Information on the proper use of the pain scale provided to the patient today. When using  our objective Pain Scale, levels between 6 and 10/10 are said to belong in an emergency room, as it progressively worsens from a 6/10, described as severely limiting, requiring emergency care not usually available at an outpatient pain management facility. At a 6/10 level, communication becomes difficult and requires great effort. Assistance to reach the emergency department may be required. Facial flushing and profuse sweating along with potentially dangerous increases in heart rate and blood pressure will be evident. Effect on ADL: Limits activities Timing: Constant Modifying factors: medication, rest BP: 133/81  HR: 70  Onset and Duration: Gradual and Present longer than 3 months Cause of pain: falls. Severity: Getting worse, NAS-11 at its worse: 10/10, NAS-11 at its best: 5/10, NAS-11 now: 10/10 and NAS-11 on the average: 5/10 Timing: Not influenced by the time of the day and After a period of immobility Aggravating Factors: Bending, Climbing, Kneeling, Lifiting, Prolonged sitting, Prolonged standing, Squatting, Stooping  and Walking Alleviating Factors: Stretching, Cold packs, Hot packs and Resting Associated Problems: Fatigue, Inability to concentrate, Sadness, Pain that wakes patient up and Pain that does not allow patient to sleep Quality of Pain: Aching, Burning, Constant, Sharp, Shooting, Tingling and Tiring Previous Examinations or Tests: The patient denies any previous treatments. Previous Treatments: The patient denies any previous treatments.  The patient comes into the clinics today for the first time for a chronic pain management evaluation.   Patient is a 53 year old female who presents with a chief complaint of axial low back pain with radiation into bilateral lower extremities, right greater than left leg.  Patient also has a history of type 2 diabetes on insulin and has diabetic  neuropathy as a result.  Patient also has depression.  As well as anxiety.  She is on Klonopin 0.5  mg twice daily as needed.  She is also being prescribed tramadol by her primary care doctor and states that she would take 1 to 2 tablets a day which were effective for her pain management.  Patient does see a therapist regularly.  She is managed on Klonopin for her anxiety due to her son being killed in a traumatic way.  This is resulted in PTSD for the patient as well.  She denies having any bowel bladder weakness.  Denies physical therapy in the past.  Denies any injection therapy..  Today I took the time to provide the patient with information regarding my pain practice. The patient was informed that my practice is divided into two sections: an interventional pain management section, as well as a completely separate and distinct medication management section. I explained that I have procedure days for my interventional therapies, and evaluation days for follow-ups and medication management. Because of the amount of documentation required during both, they are kept separated. This means that there is the possibility that she may be scheduled for a procedure on one day, and medication management the next. I have also informed her that because of staffing and facility limitations, I no longer take patients for medication management only. To illustrate the reasons for this, I gave the patient the example of surgeons, and how inappropriate it would be to refer a patient to his/her care, just to write for the post-surgical antibiotics on a surgery done by a different surgeon.   Because interventional pain management is my board-certified specialty, the patient was informed that joining my practice means that they are open to any and all interventional therapies. I made it clear that this does not mean that they will be forced to have any procedures done. What this means is that I believe interventional therapies to be essential part of the diagnosis and proper management of chronic pain conditions. Therefore,  patients not interested in these interventional alternatives will be better served under the care of a different practitioner.  The patient was also made aware of my Comprehensive Pain Management Safety Guidelines where by joining my practice, they limit all of their nerve blocks and joint injections to those done by our practice, for as long as we are retained to manage their care.   Historic Controlled Substance Pharmacotherapy Review  PMP and historical list of controlled substances: Tramadol 50 mg daily to twice daily as needed; Klonopin 0.5 mg twice daily as needed MME/day: 10-15 mg/day Medications: The patient did not bring the medication(s) to the appointment, as requested in our "New Patient Package" Pharmacodynamics: Desired effects: Analgesia: The patient reports >50% benefit. Reported improvement in function: The patient reports medication allows her to accomplish basic ADLs. Clinically meaningful improvement in function (CMIF): Sustained CMIF goals met Perceived effectiveness: Described as relatively effective, allowing for increase in activities of daily living (ADL) Undesirable effects: Side-effects or Adverse reactions: None reported Historical Monitoring: The patient  reports current drug use. Drug: Marijuana. List of all UDS Test(s): Lab Results  Component Value Date   MDMA NONE DETECTED 11/11/2015   COCAINSCRNUR NONE DETECTED 11/11/2015   PCPSCRNUR NONE DETECTED 11/11/2015   THCU POSITIVE (A) 11/11/2015   ETH <5 11/11/2015   List of other Serum/Urine Drug Screening Test(s):  Lab Results  Component Value Date   COCAINSCRNUR NONE DETECTED 11/11/2015   THCU POSITIVE (A) 11/11/2015  ETH <5 11/11/2015   Historical Background Evaluation: [place] PMP: Six (6) year initial data search conducted.              Department of public safety, offender search: Editor, commissioning Information) Non-contributory Risk Assessment Profile: Aberrant behavior: None observed or detected today Risk  factors for fatal opioid overdose: age 31-33 years old, Benzodiazepine use and history of substance abuse Fatal overdose hazard ratio (HR): Calculation deferred Non-fatal overdose hazard ratio (HR): Calculation deferred Risk of opioid abuse or dependence: 0.7-3.0% with doses ? 36 MME/day and 6.1-26% with doses ? 120 MME/day. Substance use disorder (SUD) risk level: Pending results of Medical Psychology Evaluation for SUD Personal History of Substance Abuse (SUD-Substance use disorder):  Alcohol: Negative  Illegal Drugs: Negative  Rx Drugs: Negative  ORT Risk Level calculation: Low Risk Opioid Risk Tool - 06/25/18 0823      Family History of Substance Abuse   Alcohol  Negative    Illegal Drugs  Negative    Rx Drugs  Negative      Personal History of Substance Abuse   Alcohol  Negative    Illegal Drugs  Negative    Rx Drugs  Negative      Age   Age between 64-45 years   No      History of Preadolescent Sexual Abuse   History of Preadolescent Sexual Abuse  Negative or Female      Psychological Disease   Psychological Disease  Negative    Depression  Positive      Total Score   Opioid Risk Tool Scoring  1    Opioid Risk Interpretation  Low Risk      ORT Scoring interpretation table:  Score <3 = Low Risk for SUD  Score between 4-7 = Moderate Risk for SUD  Score >8 = High Risk for Opioid Abuse   PHQ-2 Depression Scale:  Total score: 1  PHQ-2 Scoring interpretation table: (Score and probability of major depressive disorder)  Score 0 = No depression  Score 1 = 15.4% Probability  Score 2 = 21.1% Probability  Score 3 = 38.4% Probability  Score 4 = 45.5% Probability  Score 5 = 56.4% Probability  Score 6 = 78.6% Probability   PHQ-9 Depression Scale:  Total score: 1  PHQ-9 Scoring interpretation table:  Score 0-4 = No depression  Score 5-9 = Mild depression  Score 10-14 = Moderate depression  Score 15-19 = Moderately severe depression  Score 20-27 = Severe depression (2.4  times higher risk of SUD and 2.89 times higher risk of overuse)   Pharmacologic Plan: As per protocol, I have not taken over any controlled substance management, pending the results of ordered tests and/or consults.            Initial impression: Pending review of available data and ordered tests. Patient's previous UDS positive for THC.  No subsequent UDS in medical record.  Is currently on Klonopin for anxiety due to traumatic death of her son and associated PTSD.  Refer to psych for risk evaluation for substance abuse disorder however I feel that patient is low risk especially since her tramadol intake is so low. Meds   Current Outpatient Medications:  .  Albuterol Sulfate 108 (90 Base) MCG/ACT AEPB, Inhale into the lungs. Use 2 puffs 4 times a day prn, Disp: , Rfl:  .  Aspirin-Salicylamide-Caffeine (BC HEADACHE POWDER PO), Take 4 packets by mouth daily as needed (pain)., Disp: , Rfl:  .  citalopram (CELEXA) 20 MG  tablet, Take 40 mg by mouth 2 (two) times daily. , Disp: , Rfl:  .  clonazePAM (KLONOPIN) 0.5 MG tablet, Take 0.5 mg by mouth 2 (two) times daily as needed for anxiety (Sometimes takes 3 tablets a day). , Disp: , Rfl:  .  Dulaglutide (TRULICITY) 3.32 RJ/1.8AC SOPN, Inject into the skin once a week., Disp: , Rfl:  .  Fluticasone Propionate (FLONASE NA), Place into the nose as needed. Use 2 spray in each nostril prn, Disp: , Rfl:  .  gabapentin (NEURONTIN) 600 MG tablet, TAKE TWO TABLETS (1200 MG) BY MOUTH 3 TIMES A DAY (Patient taking differently: Take one pill tid), Disp: 180 tablet, Rfl: 0 .  medroxyPROGESTERone (PROVERA) 10 MG tablet, Take 1 tablet (10 mg total) by mouth daily., Disp: 30 tablet, Rfl: 11 .  metFORMIN (GLUCOPHAGE) 1000 MG tablet, TAKE ONE TABLET BY MOUTH 2 TIMES A DAY WITH A MEAL, Disp: 180 tablet, Rfl: 0 .  Omega-3 Fatty Acids (FISH OIL) 1000 MG CAPS, Take 1 capsule (1,000 mg total) by mouth daily. Reported on 09/19/2015, Disp: 90 capsule, Rfl: 3 .  sitaGLIPtin  (JANUVIA) 100 MG tablet, Take 100 mg by mouth daily., Disp: , Rfl:  .  tiZANidine (ZANAFLEX) 4 MG tablet, Take 4 mg by mouth 2 (two) times daily as needed for muscle spasms., Disp: , Rfl:  .  traMADol (ULTRAM) 50 MG tablet, Take by mouth every 6 (six) hours as needed., Disp: , Rfl:   Imaging Review  Cervical Imaging:  Results for orders placed during the hospital encounter of 02/21/15  DG Cervical Spine 2-3 Views   Narrative CLINICAL DATA:  Neck pain after fall 2 weeks ago. Initial encounter.  EXAM: CERVICAL SPINE - 2-3 VIEW  COMPARISON:  None.  FINDINGS: There is no evidence of cervical spine fracture or prevertebral soft tissue swelling. Alignment is normal.  C5-6 disc narrowing with ventral spurring.  Negative facets.  IMPRESSION: 1. No acute finding. 2. Moderate C5-6 disc degeneration.   Electronically Signed   By: Monte Fantasia M.D.   On: 02/21/2015 09:06    Lumbosacral Imaging: Lumbar MR wo contrast:  Results for orders placed during the hospital encounter of 08/27/11  MR Lumbar Spine Wo Contrast   Narrative *RADIOLOGY REPORT*  Clinical Data: Numbness and weakness in both legs.  Claustrophobia.  MRI LUMBAR SPINE WITHOUT CONTRAST  Technique:  Multiplanar and multiecho pulse sequences of the lumbar spine were obtained without intravenous contrast.  Comparison: 07/07/2011  Findings: The lowest full intervertebral disk space is labeled L5- S1.  If procedural intervention is to be performed, careful correlation with this numbering strategy is recommended.  The conus medullaris appears unremarkable.  Conus level:  L1-2.  There is 4 mm of degenerative posterior subluxation of L5 on S1. Intervertebral disc desiccation is noted at L5-S1, with mild loss of intervertebral disc height.  Despite efforts by the patient and technologist, motion artifact is present on some series of today's examination and could not be totally eliminated.  This reduces diagnostic  sensitivity and specificity.  Inversion recovery weighted images demonstrate no significant abnormal vertebral or periligamentous edema.  Additional findings at individual levels are as follows:  L2-3:  Unremarkable.  L3-4:  Unremarkable.  L4-5:  No impingement.  Minimal disc bulge and mild facet arthropathy.  L5-S1:  Mild to moderate left subarticular lateral recess stenosis due to a left paracentral disc protrusion along with a focal disc component extending caudad adjacent to the left S1 nerve roots (possibly a  component of the protrusion or conceivably a small disc fragment).  Borderline bilateral foraminal stenosis secondary to facet arthropathy and intervertebral spurring.  IMPRESSION:  1.  Disc protrusion and possible disc fragment contribute to mild to moderate left subarticular lateral recess stenosis at L5-S1 affecting the left S1 nerve roots. 2.  Borderline bilateral foraminal stenosis at L5-S1.  Original Report Authenticated By: Carron Curie, M.D.    Lumbar DG 2-3 views:  Results for orders placed during the hospital encounter of 12/29/15  DG Lumbar Spine 2-3 Views   Narrative CLINICAL DATA:  Neuropathy.  EXAM: LUMBAR SPINE - 2-3 VIEW  COMPARISON:  Lumbar spine 05/13/2014 .  FINDINGS: No acute bony abnormality identified. Mild degenerative change L5-S1. No acute abnormality identified. Left nephrolithiasis cannot be excluded.  IMPRESSION: Mild L5-S1 degenerative change. No acute abnormality . Left nephrolithiasis cannot be excluded.   Electronically Signed   By: Marcello Moores  Register   On: 12/29/2015 11:55    Lumbar DG (Complete) 4+V:  Results for orders placed during the hospital encounter of 07/07/11  DG Lumbar Spine Complete   Narrative *RADIOLOGY REPORT*  Clinical Data: Back pain, status post fall  LUMBAR SPINE - COMPLETE 4+ VIEW  Comparison: None.  Findings: Five views of the lumbar spine submitted.  No acute fracture or  subluxation.  There is  moderate disc space flattening at L5-S1 level.  Alignment and vertebral height are preserved.  IMPRESSION: No acute fracture or subluxation.  Moderate disc space flattening at L5 S1 level.  Original Report Authenticated By: Lahoma Crocker, M.D.          Knee-R DG 4 views:  Results for orders placed during the hospital encounter of 05/13/17  DG Knee Complete 4 Views Right   Narrative CLINICAL DATA:  53 year old female with a history of fall and knee pain  EXAM: RIGHT KNEE - COMPLETE 4+ VIEW  COMPARISON:  None.  FINDINGS: No evidence of fracture, dislocation, or joint effusion. No evidence of arthropathy or other focal bone abnormality. Soft tissues are unremarkable.  IMPRESSION: Negative for acute bony abnormality   Electronically Signed   By: Corrie Mckusick D.O.   On: 05/13/2017 14:36    Knee-L DG 4 views:  Results for orders placed during the hospital encounter of 03/04/17  DG Knee Complete 4 Views Left   Narrative CLINICAL DATA:  Fall.  Pain.  No recent.  EXAM: LEFT KNEE - COMPLETE 4+ VIEW  COMPARISON:  No prior.  FINDINGS: No evidence of fracture, dislocation, or joint effusion. No evidence of arthropathy or other focal bone abnormality. Soft tissues are unremarkable.  IMPRESSION: No acute abnormality .   Electronically Signed   By: Marcello Moores  Register   On: 03/04/2017 10:32    Hand-L DG Complete:  Results for orders placed during the hospital encounter of 03/04/17  DG Hand Complete Left   Narrative CLINICAL DATA:  Left hand pain since a fall yesterday. Initial encounter.  EXAM: LEFT HAND - COMPLETE 3+ VIEW  COMPARISON:  None.  FINDINGS: There is no evidence of fracture or dislocation. Mild joint space narrowing and osteophytosis are seen at the IP joint of the thumb and first CMC joint. Soft tissues are unremarkable.  IMPRESSION: No acute abnormality.  Mild osteoarthritis IP joint of the thumb and first CMC  joint.   Electronically Signed   By: Inge Rise M.D.   On: 03/04/2017 10:28     Complexity Note: Imaging results reviewed. Results shared with Brandi Calderon, using Layman's terms.  ROS  Cardiovascular: No reported cardiovascular signs or symptoms such as High blood pressure, coronary artery disease, abnormal heart rate or rhythm, heart attack, blood thinner therapy or heart weakness and/or failure Pulmonary or Respiratory: Lung problems, Shortness of breath, Smoking and Snoring  Neurological: No reported neurological signs or symptoms such as seizures, abnormal skin sensations, urinary and/or fecal incontinence, being born with an abnormal open spine and/or a tethered spinal cord Review of Past Neurological Studies:  Results for orders placed or performed during the hospital encounter of 11/11/15  CT Head Wo Contrast   Narrative   CLINICAL DATA:  Altered mental status. Recent vomiting and shaking beginning today.  EXAM: CT HEAD WITHOUT CONTRAST  TECHNIQUE: Contiguous axial images were obtained from the base of the skull through the vertex without intravenous contrast.  COMPARISON:  None.  FINDINGS: No evidence of intracranial hemorrhage, brain edema, or other signs of acute infarction. No evidence of intracranial mass lesion or mass effect.  No abnormal extraaxial fluid collections identified. Ventricles are normal in size. No skull abnormality identified.  IMPRESSION: Negative noncontrast head CT.   Electronically Signed   By: Earle Gell M.D.   On: 11/11/2015 19:30    Psychological-Psychiatric: Anxiousness, Prone to panicking and Difficulty sleeping and or falling asleep Gastrointestinal: No reported gastrointestinal signs or symptoms such as vomiting or evacuating blood, reflux, heartburn, alternating episodes of diarrhea and constipation, inflamed or scarred liver, or pancreas or irrregular and/or infrequent bowel  movements Genitourinary: No reported renal or genitourinary signs or symptoms such as difficulty voiding or producing urine, peeing blood, non-functioning kidney, kidney stones, difficulty emptying the bladder, difficulty controlling the flow of urine, or chronic kidney disease Hematological: No reported hematological signs or symptoms such as prolonged bleeding, low or poor functioning platelets, bruising or bleeding easily, hereditary bleeding problems, low energy levels due to low hemoglobin or being anemic Endocrine: High blood sugar requiring insulin (IDDM) Rheumatologic: No reported rheumatological signs and symptoms such as fatigue, joint pain, tenderness, swelling, redness, heat, stiffness, decreased range of motion, with or without associated rash Musculoskeletal: Negative for myasthenia gravis, muscular dystrophy, multiple sclerosis or malignant hyperthermia Work History: Disabled  Allergies  Brandi Calderon is allergic to penicillins; penicillins; vicodin [hydrocodone-acetaminophen]; victoza [liraglutide]; and vicodin [hydrocodone-acetaminophen].  Laboratory Chemistry  Inflammation Markers (CRP: Acute Phase) (ESR: Chronic Phase) Lab Results  Component Value Date   ESRSEDRATE 15 03/19/2017   LATICACIDVEN 1.36 09/05/2016                         Rheumatology Markers Lab Results  Component Value Date   LABURIC 3.9 06/25/2017                        Renal Function Markers Lab Results  Component Value Date   BUN 14 10/10/2017   CREATININE 0.77 10/10/2017   BCR 15 06/25/2017   GFRAA >60 10/10/2017   GFRNONAA >60 10/10/2017                             Hepatic Function Markers Lab Results  Component Value Date   AST 8 06/25/2017   ALT 7 06/25/2017   ALBUMIN 4.3 06/25/2017   ALKPHOS 105 06/25/2017   LIPASE 20 09/05/2016                        Electrolytes Lab Results  Component Value  Date   NA 141 10/10/2017   K 3.8 10/10/2017   CL 101 10/10/2017   CALCIUM 9.7  10/10/2017   MG 1.4 (L) 09/05/2016   PHOS 4.2 05/01/2016                        Neuropathy Markers Lab Results  Component Value Date   VITAMINB12 249 06/25/2017   HGBA1C 8.4 (H) 06/25/2017                        CNS Tests No results found for: COLORCSF, APPEARCSF, RBCCOUNTCSF, WBCCSF, POLYSCSF, LYMPHSCSF, EOSCSF, PROTEINCSF, GLUCCSF, JCVIRUS, CSFOLI, IGGCSF                      Bone Pathology Markers No results found for: VD25OH, [id number], [id number], [id number], 25OHVITD1, 25OHVITD2, 25OHVITD3, TESTOFREE, TESTOSTERONE                       Coagulation Parameters Lab Results  Component Value Date   INR 1.02 09/05/2016   LABPROT 13.4 09/05/2016   PLT 321 10/10/2017                        Cardiovascular Markers Lab Results  Component Value Date   CKTOTAL 65 02/21/2015   TROPONINI <0.03 11/11/2015   HGB 12.7 10/10/2017   HCT 38.0 10/10/2017                         CA Markers No results found for: CEA, CA125, LABCA2                      Endocrine Markers Lab Results  Component Value Date   TSH 1.97 04/05/2015                        Note: Lab results reviewed.  PFSH  Drug: Brandi Calderon  reports current drug use. Drug: Marijuana. Alcohol:  reports no history of alcohol use. Tobacco:  reports that she has been smoking cigarettes. She has a 99.00 pack-year smoking history. She has never used smokeless tobacco. Medical:  has a past medical history of Arthritis, Back pain, COPD (chronic obstructive pulmonary disease) ([place]), Depression, Diabetes mellitus, Diabetes mellitus without complication ([place]), Headache, Hyperlipidemia, Hypertension, and Tendonitis. Family: family history includes Dementia in her father; Diabetes in her brother, brother, father, mother, and sister; Kidney disease in her father.  Past Surgical History:  Procedure Laterality Date  . CESAREAN SECTION    . CESAREAN SECTION     3 times  . cyst on neck     as a baby/right side   Active Ambulatory  Problems    Diagnosis Date Noted  . Type 2 diabetes mellitus treated with insulin ([place]) 05/24/2015  . Back pain 05/24/2015  . Menorrhagia 09/28/2015  . Neuropathy due to type 2 diabetes mellitus ([place]) - severe 12/19/2015  . Depression, major, single episode, moderate ([place]) 03/11/2016  . Foot pain, right 04/30/2016   Resolved Ambulatory Problems    Diagnosis Date Noted  . No Resolved Ambulatory Problems   Past Medical History:  Diagnosis Date  . Arthritis   . COPD (chronic obstructive pulmonary disease) ([place])   . Depression   . Diabetes mellitus   . Diabetes mellitus without complication ([place])   . Headache   . Hyperlipidemia   .  Hypertension   . Tendonitis    Constitutional Exam  General appearance: Well nourished, well developed, and well hydrated. In no apparent acute distress Vitals:   06/25/18 0812 06/25/18 0813  BP:  133/81  Pulse: 70   Resp: 18   Temp: 98.4 F (36.9 C)   SpO2: 99%   Weight: 150 lb (68 kg)   Height: 5' 1"  (1.549 m)    BMI Assessment: Estimated body mass index is 28.34 kg/m as calculated from the following:   Height as of this encounter: 5' 1"  (1.549 m).   Weight as of this encounter: 150 lb (68 kg).  BMI interpretation table: BMI level Category Range association with higher incidence of chronic pain  <18 kg/m2 Underweight   18.5-24.9 kg/m2 Ideal body weight   25-29.9 kg/m2 Overweight Increased incidence by 20%  30-34.9 kg/m2 Obese (Class I) Increased incidence by 68%  35-39.9 kg/m2 Severe obesity (Class II) Increased incidence by 136%  >40 kg/m2 Extreme obesity (Class III) Increased incidence by 254%   Patient's current BMI Ideal Body weight  Body mass index is 28.34 kg/m. Ideal body weight: 47.8 kg (105 lb 6.1 oz) Adjusted ideal body weight: 55.9 kg (123 lb 3.7 oz)   BMI Readings from Last 4 Encounters:  06/25/18 28.34 kg/m  05/20/18 28.72 kg/m  05/06/18 28.72 kg/m  04/02/18 28.87 kg/m   Wt Readings from Last 4 Encounters:   06/25/18 150 lb (68 kg)  05/20/18 152 lb (68.9 kg)  05/06/18 152 lb (68.9 kg)  04/02/18 152 lb 12.8 oz (69.3 kg)  Psych/Mental status: Alert, oriented x 3 (person, place, & time)       Eyes: PERLA Respiratory: No evidence of acute respiratory distress  Cervical Spine Area Exam  Skin & Axial Inspection: No masses, redness, edema, swelling, or associated skin lesions Alignment: Symmetrical Functional ROM: Unrestricted ROM      Stability: No instability detected Muscle Tone/Strength: Functionally intact. No obvious neuro-muscular anomalies detected. Sensory (Neurological): Unimpaired Palpation: No palpable anomalies              Upper Extremity (UE) Exam    Side: Right upper extremity  Side: Left upper extremity  Skin & Extremity Inspection: Skin color, temperature, and hair growth are WNL. No peripheral edema or cyanosis. No masses, redness, swelling, asymmetry, or associated skin lesions. No contractures.  Skin & Extremity Inspection: Skin color, temperature, and hair growth are WNL. No peripheral edema or cyanosis. No masses, redness, swelling, asymmetry, or associated skin lesions. No contractures.  Functional ROM: Unrestricted ROM          Functional ROM: Unrestricted ROM          Muscle Tone/Strength: Functionally intact. No obvious neuro-muscular anomalies detected.  Muscle Tone/Strength: Functionally intact. No obvious neuro-muscular anomalies detected.  Sensory (Neurological): Unimpaired          Sensory (Neurological): Unimpaired          Palpation: No palpable anomalies              Palpation: No palpable anomalies              Provocative Test(s):  Phalen's test: deferred Tinel's test: deferred Apley's scratch test (touch opposite shoulder):  Action 1 (Across chest): deferred Action 2 (Overhead): deferred Action 3 (LB reach): deferred   Provocative Test(s):  Phalen's test: deferred Tinel's test: deferred Apley's scratch test (touch opposite shoulder):  Action 1 (Across  chest): deferred Action 2 (Overhead): deferred Action 3 (LB reach): deferred  Thoracic Spine Area Exam  Skin & Axial Inspection: No masses, redness, or swelling Alignment: Symmetrical Functional ROM: Unrestricted ROM Stability: No instability detected Muscle Tone/Strength: Functionally intact. No obvious neuro-muscular anomalies detected. Sensory (Neurological): Unimpaired Muscle strength & Tone: No palpable anomalies  Lumbar Spine Area Exam  Skin & Axial Inspection: No masses, redness, or swelling Alignment: Symmetrical Functional ROM: Pain restricted ROM       Stability: No instability detected Muscle Tone/Strength: Functionally intact. No obvious neuro-muscular anomalies detected. Sensory (Neurological): Dermatomal pain pattern Palpation: No palpable anomalies       Provocative Tests: Hyperextension/rotation test: (+) bilaterally for facet joint pain. Lumbar quadrant test (Kemp's test): (+) bilateral for foraminal stenosis Lateral bending test: (+) ipsilateral radicular pain, on the right. Positive for right-sided foraminal stenosis. Patrick's Maneuver: (+) for bilateral S-I arthralgia             FABER* test: deferred today                   S-I anterior distraction/compression test: deferred today         S-I lateral compression test: deferred today         S-I Thigh-thrust test: deferred today         S-I Gaenslen's test: deferred today         *(Flexion, ABduction and External Rotation)  Gait & Posture Assessment  Ambulation: Unassisted Gait: Relatively normal for age and body habitus Posture: WNL   Lower Extremity Exam    Side: Right lower extremity  Side: Left lower extremity  Stability: No instability observed          Stability: No instability observed          Skin & Extremity Inspection: Skin color, temperature, and hair growth are WNL. No peripheral edema or cyanosis. No masses, redness, swelling, asymmetry, or associated skin lesions. No contractures.  Skin  & Extremity Inspection: Skin color, temperature, and hair growth are WNL. No peripheral edema or cyanosis. No masses, redness, swelling, asymmetry, or associated skin lesions. No contractures.  Functional ROM: Pain restricted ROM for hip and knee joints          Functional ROM: Pain restricted ROM for hip and knee joints          Muscle Tone/Strength: Functionally intact. No obvious neuro-muscular anomalies detected.  Muscle Tone/Strength: Functionally intact. No obvious neuro-muscular anomalies detected.  Sensory (Neurological): Neuropathic pain pattern        Sensory (Neurological): Neuropathic pain pattern        DTR: Patellar: 1+: trace Achilles: deferred today Plantar: deferred today  DTR: Patellar: 1+: trace Achilles: deferred today Plantar: deferred today  Palpation: No palpable anomalies  Palpation: No palpable anomalies   Assessment  Primary Diagnosis & Pertinent Problem List: The primary encounter diagnosis was Chronic bilateral low back pain with bilateral sciatica. 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  Visit Diagnosis (New problems to examiner): 1. Chronic bilateral low back pain with bilateral sciatica   2. Chronic SI joint pain   3. 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 Depression, major, single episode, moderate ([place])   5. Anxiety and depression   6. Chronic pain syndrome   7. Pharmacologic therapy   8. Lumbar degenerative disc  disease    Patient is a 53 year old female who presents with a chief complaint of axial low back pain with radiation into bilateral lower extremities, right greater than left leg.  Patient also has a history of type 2 diabetes on insulin and has diabetic neuropathy as a result.   Patient also has depression.  As well as anxiety.  She is on Klonopin 0.5 mg twice daily as needed.  She is also being prescribed tramadol by her primary care doctor and states that she would take 1 to 2 tablets a day which were effective for her pain management.  Patient does see a therapist regularly.  She is managed on Klonopin for her anxiety due to her son being killed in a traumatic way.  This is resulted in PTSD for the patient as well.  She denies having any bowel bladder weakness.  Denies physical therapy in the past.  Denies any injection therapy..  Regards to treatment plan, would like to obtain x-rays of patient's spine since it has been over 24 months since recent spinal imaging.  Patient does have a lumbar MRI in the computer which shows L5-S1 facet arthropathy, degeneration and foraminal stenosis, left greater than right.  However now patient states that her symptoms are worse on the right.  This could be due to disease progression targeting the left side.  Pending x-ray results will consider advanced imaging with lumbar MRI without contrast.  In regards to medication management, will send patient to see psychiatrist for risk evaluation.  Patient's total opioid dose is relatively low, tramadol 50 mg daily to twice daily PRN, quantity 45 to 60/month PRN.  However I would still like a risk assessment in the context of her having a positive UDS for THC, concomitant Klonopin intake which seems effective in managing her anxiety symptoms, age less than 12 which increases her risk of opioid misuse abuse.  Upon my evaluation I do not have any significant concerns but hoping that the psychiatrist will be able to conduct a more thorough psych assessment to evaluate risk of substance abuse/misuse.  Interventional options for this patient can include lumbar epidural steroid injection, lumbar transforaminal epidural steroid injection, lumbar facet medial branch nerve blocks.  Can discuss further pending  x-ray and advanced imaging results.  Obtain UDS today as well as lab work.  UDS should be positive for Klonopin and tramadol.  Patient instructed to continue all of her other medications as prescribed.  **Of note, patient told nursing staff that her UDS would be positive for [facility] AFTER submitting urine. She DID NOT disclose this to me during consultation. Raises concern.**  Plan of Care (Initial workup plan)  Note: Please be advised that as per protocol, today's visit has been an evaluation only. We have not taken over the patient's controlled substance management.  Problem-specific plan: No problem-specific Assessment & Plan notes found for this encounter.  Ordered Lab-work, Procedure(s), Referral(s), & Consult(s): Orders Placed This Encounter  Procedures  . DG Lumbar Spine Complete W/Bend  . DG Si Joints  . DG Thoracic Spine 2 View  . Compliance Drug Analysis, Ur  . Comp. Metabolic Panel (12)  . Magnesium  . Vitamin B12  . 25-Hydroxyvitamin D Lcms D2+D3  . Ambulatory referral to Psychology  . Ambulatory referral to Physical Therapy   Pharmacotherapy (current): Medications ordered:  No orders of the defined types were placed in this encounter.  Medications administered during this visit: Dannielle Burn had no medications administered during this visit.  Pharmacological management options:  Opioid Analgesics: The patient was informed that there is no guarantee that she would be a candidate for opioid analgesics. The decision will be made following CDC guidelines. This decision will be based on the results of diagnostic studies, as well as Brandi Calderon's risk profile.   Membrane stabilizer: To be determined at a later time  Muscle relaxant: To be determined at a later time  NSAID: To be determined at a later time  Other analgesic(s): To be determined at a later time   Interventional management options: Brandi Calderon was informed that there is no guarantee that she would be a  candidate for interventional therapies. The decision will be based on the results of diagnostic studies, as well as Brandi Calderon's risk profile.  Procedure(s) under consideration:  Lumbar epidural steroid injection Number facet medial branch nerve blocks with possible lumbar radiofrequency ablation Bilateral SI joint injection   Provider-requested follow-up: Return in about 4 weeks (around 07/23/2018) for Medication Management, After Imaging, After Psychological evaluation.  Future Appointments  Date Time Provider [place]  07/23/2018  9:30 AM Gillis Santa, MD [facility] None    Primary Care Physician: Pa[facility] Location: [facility] Outpatient Pain Management Facility Note by: Gillis Santa, M.D, Date: 06/25/2018; Time: 9:31 AM  Patient Instructions  You have been instructed to get xrays done.

## 2018-06-25 NOTE — Patient Instructions (Signed)
You have been instructed to get xrays done.

## 2018-06-25 NOTE — Progress Notes (Signed)
Safety precautions to be maintained throughout the outpatient stay will include: orient to surroundings, keep bed in low position, maintain call bell within reach at all times, provide assistance with transfer out of bed and ambulation.  

## 2018-06-29 LAB — COMPLIANCE DRUG ANALYSIS, UR

## 2018-07-23 ENCOUNTER — Ambulatory Visit
Payer: Medicare HMO | Attending: Student in an Organized Health Care Education/Training Program | Admitting: Student in an Organized Health Care Education/Training Program

## 2018-07-23 ENCOUNTER — Other Ambulatory Visit: Payer: Self-pay

## 2018-07-23 ENCOUNTER — Telehealth: Payer: Self-pay

## 2018-07-23 DIAGNOSIS — F419 Anxiety disorder, unspecified: Secondary | ICD-10-CM

## 2018-07-23 DIAGNOSIS — F321 Major depressive disorder, single episode, moderate: Secondary | ICD-10-CM

## 2018-07-23 DIAGNOSIS — F32A Depression, unspecified: Secondary | ICD-10-CM | POA: Insufficient documentation

## 2018-07-23 DIAGNOSIS — M5136 Other intervertebral disc degeneration, lumbar region: Secondary | ICD-10-CM | POA: Insufficient documentation

## 2018-07-23 DIAGNOSIS — Z79899 Other long term (current) drug therapy: Secondary | ICD-10-CM | POA: Insufficient documentation

## 2018-07-23 DIAGNOSIS — F1290 Cannabis use, unspecified, uncomplicated: Secondary | ICD-10-CM | POA: Insufficient documentation

## 2018-07-23 DIAGNOSIS — M533 Sacrococcygeal disorders, not elsewhere classified: Secondary | ICD-10-CM | POA: Diagnosis not present

## 2018-07-23 DIAGNOSIS — M5441 Lumbago with sciatica, right side: Secondary | ICD-10-CM

## 2018-07-23 DIAGNOSIS — G8929 Other chronic pain: Secondary | ICD-10-CM | POA: Insufficient documentation

## 2018-07-23 DIAGNOSIS — M5442 Lumbago with sciatica, left side: Secondary | ICD-10-CM

## 2018-07-23 DIAGNOSIS — G894 Chronic pain syndrome: Secondary | ICD-10-CM | POA: Insufficient documentation

## 2018-07-23 DIAGNOSIS — F329 Major depressive disorder, single episode, unspecified: Secondary | ICD-10-CM | POA: Insufficient documentation

## 2018-07-23 NOTE — Telephone Encounter (Signed)
Mailed AVS from 07/23/18 visit

## 2018-07-23 NOTE — Progress Notes (Signed)
Pain Management Virtual Encounter Note - Virtual Visit via [place] (real-time audio visits between healthcare provider and patient).  Patient's Phone No. & Preferred Pharmacy:  [phone] (home); [phone] (mobile); (Preferred) [phone] No e-mail address on record  [id number] - [place], [place] - [address] [place] [address] Phone: [phone] Fax: [phone]   Pre-screening note:  Our staff contacted Brandi Calderon and offered her an "in person", "face-to-face" appointment versus a telephone encounter. She indicated preferring the telephone encounter, at this time.  Reason for Virtual Visit: COVID-19*  Social distancing based on CDC and AMA recommendations.   I contacted Brandi Calderon on 07/23/2018 at 11:03 AM via video conference and clearly identified myself as Gillis Santa, MD. I verified that I was speaking with the correct person using two identifiers (Name and date of birth: 11/23/1965).  Advanced Informed Consent I sought verbal advanced consent from Brandi Calderon for virtual visit interactions. I informed Brandi Calderon of possible security and privacy concerns, risks, and limitations associated with providing "not-in-person" medical evaluation and management services. I also informed Brandi Calderon of the availability of "in-person" appointments. Finally, I informed her that there would be a charge for the virtual visit and that she could be  personally, fully or partially, financially responsible for it. Brandi Calderon expressed understanding and agreed to proceed.   Historic Elements   Brandi Calderon is a 53 y.o. year old, female patient evaluated today after her last encounter by our practice on 06/25/2018. Brandi Calderon  has a past medical history of Arthritis, Back pain, COPD (chronic obstructive pulmonary disease) ([place]), Depression, Diabetes mellitus, Diabetes mellitus without complication ([place]), Headache, Hyperlipidemia,  Hypertension, and Tendonitis. She also  has a past surgical history that includes Cesarean section; Cesarean section; and cyst on neck. Brandi Calderon has a current medication list which includes the following prescription(s): albuterol sulfate, aspirin-salicylamide-caffeine, citalopram, clonazepam, dulaglutide, fluticasone propionate, gabapentin, medroxyprogesterone, metformin, fish oil, sitagliptin, tizanidine, tramadol, atorvastatin, and bupropion. She  reports that she has been smoking cigarettes. She has a 99.00 pack-year smoking history. She has never used smokeless tobacco. She reports current drug use. Drug: Marijuana. She reports that she does not drink alcohol. Brandi Calderon is allergic to penicillins; penicillins; vicodin [hydrocodone-acetaminophen]; victoza [liraglutide]; and vicodin [hydrocodone-acetaminophen].   HPI  I last saw her on 06/25/2018. She is being evaluated for Second patient visit.  Patient's previous visit with me was on 06/25/2018.  At that time a urine drug screen was ordered which is positive for THC.  Patient was not forthright about this before however she did tell nursing staff when submitting urine that it would be positive for THC.  This is concerning and I do not recommend concomitant opioid therapy while the patient is utilizing THC.  Patient also takes Klonopin as well.  Patient has not had her x-rays done.  I encouraged her to complete them when the hospital is allowing elective imaging.  Patient endorsed understanding.  I clearly informed the patient that she would not be a candidate for chronic opioid therapy at this clinic given her positive THC in her urine which she was not forthright with me about.  However she could be a candidate for interventional therapies.  Treatment plan will be developed after imaging studies have been completed.  Patient endorsed understanding and was in agreement with treatment plan.   Review of recent tests  CT Soft Tissue Neck W  Contrast CLINICAL DATA:  53 year old female  with right side neck pain and swelling for 3 days.  EXAM: CT NECK WITH CONTRAST  TECHNIQUE: Multidetector CT imaging of the neck was performed using the standard protocol following the bolus administration of intravenous contrast.  CONTRAST:  57mL OMNIPAQUE IOHEXOL 300 MG/ML  SOLN  COMPARISON:  Head CT without contrast 11/11/2015.  FINDINGS: Pharynx and larynx: Normal larynx. Pharyngeal soft tissue contours appear normal aside from perhaps mild right lateral pharyngeal wall mucosal thickening. Likewise, there is subtle inflammatory stranding in the right parapharyngeal space. The left parapharyngeal and retropharyngeal spaces are normal.  Salivary glands: Normal sublingual space, left submandibular gland, and left parotid gland.  A marked area of clinical concern on series 3, image 23 corresponds to an asymmetrically enlarged and hyperenhancing right parotid gland. There is generalized right parotid swelling and enhancement with regional superficial soft tissue stranding. No discrete parotid lesion.  Soft tissue stranding and thickening of the platysma track inferiorly from the right parotid space to the right submandibular space. There is no convincing asymmetric hyperenhancement of the right submandibular gland.  No sialolithiasis identified. The right parotid duct appears mildly hyperenhancing, but not dilated (series 3, image 15).  Thyroid: Superficial soft tissue stranding continues in the midline and right anterior neck to just above the level of the thyroid. The thyroid is normal.  Lymph nodes: Small but asymmetrically enlarged and mildly hyperenhancing right level IIa lymph nodes up to 6 millimeters short axis. Minimal similar asymmetry of the right level 3 nodes.  No cystic or necrotic nodes.  Vascular: Major vascular structures in the neck and at the skull base are patent. There is proximal left ICA low-density  plaque which does not appear hemodynamically significant at this time.  Limited intracranial: Negative.  Visualized orbits: Not included.  Mastoids and visualized paranasal sinuses: Clear.  Skeleton: Absent dentition. Age concordant cervical spine degeneration. No acute osseous abnormality identified.  Upper chest: Lung apices within normal limits. Normal visible mediastinum and axillary lymph nodes.  IMPRESSION: 1. Acute Parotitis on the right. No sialolithiasis or parotid duct enlargement such that viral/infectious etiology is favored. 2. Reactive lymphadenopathy. Soft tissue inflammation tracking into the right submandibular and parapharyngeal spaces. 3. No abscess or complicating features.  Electronically Signed   By: Genevie Ann M.D.   On: 10/02/2017 17:37   Office Visit on 06/25/2018  Component Date Value Ref Range Status  . Summary 06/25/2018 FINAL   Final   Comment: ==================================================================== TOXASSURE COMP DRUG ANALYSIS,UR ==================================================================== Test                             Result       Flag       Units Drug Present not Declared for Prescription Verification   Carboxy-THC                    309          UNEXPECTED ng/mg creat    Carboxy-THC is a metabolite of tetrahydrocannabinol  (THC).    Source of [facility] is most commonly illicit, but THC is also present    in a scheduled prescription medication.   Tramadol                       >2041        UNEXPECTED ng/mg creat   O-Desmethyltramadol            >2041  UNEXPECTED ng/mg creat   N-Desmethyltramadol            565          UNEXPECTED ng/mg creat    Source of tramadol is a prescription medication.    O-desmethyltramadol and N-desmethyltramadol are expected    metabolites of tramadol. Drug Absent but Declared for Prescription Verification   Clonazepam                     Not Detected UNEXP                          ECTED  ng/mg creat   Gabapentin                     Not Detected UNEXPECTED   Citalopram                     Not Detected UNEXPECTED   Salicylate                     Not Detected UNEXPECTED    Salicylate, as indicated in the declared medication list, is not    always detected even when used as directed. ==================================================================== Test                      Result    Flag   Units      Ref Range   Creatinine              245              mg/dL      >=20 ==================================================================== Declared Medications:  The flagging and interpretation on this report are based on the  following declared medications.  Unexpected results may arise from  inaccuracies in the declared medications.  **Note: The testing scope of this panel includes these medications:  Citalopram (Celexa)  Clonazepam  Gabapentin  **Note: The testing scope of this panel does not include small to  moderate amounts of these reported m                          edications:  Salicylate (Caffeine/Aspirin)  Salicylate (Salicylamide)  **Note: The testing scope of this panel does not include following  reported medications:  Albuterol  Caffeine (Caffeine/Aspirin)  Dulaglutide  Fluticasone  Medroxyprogesterone (Provera)  Metformin ==================================================================== For clinical consultation, please call [phone]. ====================================================================   UDS + for THC Assessment  The primary encounter diagnosis was Chronic bilateral low back pain with bilateral sciatica. Diagnoses of Chronic SI joint pain, Depression, major, single episode, moderate (HCC), Anxiety and depression, Chronic pain syndrome, Pharmacologic therapy, Lumbar degenerative disc disease, and Marijuana use were also pertinent to this visit.  Plan of Care  I am having Dannielle Burn maintain her  Aspirin-Salicylamide-Caffeine (BC HEADACHE POWDER PO), citalopram, clonazePAM, Fish Oil, medroxyPROGESTERone, gabapentin, metFORMIN, sitaGLIPtin, Dulaglutide, Fluticasone Propionate (FLONASE NA), Albuterol Sulfate, traMADol, tiZANidine, atorvastatin, and buPROPion.  Patient's previous visit with me was on 06/25/2018.  At that time a urine drug screen was ordered which is positive for THC.  Patient was not forthright about this before however she did tell nursing staff when submitting urine that it would be positive for THC.  This is concerning and I do not recommend concomitant opioid therapy while the patient is utilizing THC.  Patient also takes Klonopin as well.  Patient has  not had her x-rays done.  I encouraged her to complete them when the hospital is allowing elective imaging.  Patient endorsed understanding.  I clearly informed the patient that she would not be a candidate for chronic opioid therapy at this clinic given her positive THC in her urine which she was not forthright with me about.  However she could be a candidate for interventional therapies.  Treatment plan will be developed after imaging studies have been completed.  Patient endorsed understanding and was in agreement with treatment plan.  I discussed the assessment and treatment plan with the patient. The patient was provided an opportunity to ask questions and all were answered. The patient agreed with the plan and demonstrated an understanding of the instructions.  Patient advised to call back or seek an in-person evaluation if the symptoms or condition worsens.  Total duration of non-face-to-face encounter: 25 minutes.  Note by: Gillis Santa, MD Date: 07/23/2018; Time: 11:03 AM  Disclaimer:  * Given the special circumstances of the COVID-19 pandemic, the federal government has announced that the Office for Civil Rights (OCR) will exercise its enforcement discretion and will not impose penalties on physicians using telehealth  in the event of noncompliance with regulatory requirements under the [place] and Accountability Act (HIPAA) in connection with the good faith provision of telehealth during the YTKZS-01 national public health emergency. ([place])

## 2018-08-31 ENCOUNTER — Ambulatory Visit: Payer: Medicare HMO | Attending: Student in an Organized Health Care Education/Training Program

## 2018-09-02 ENCOUNTER — Ambulatory Visit: Payer: Medicare HMO

## 2018-12-03 ENCOUNTER — Ambulatory Visit (INDEPENDENT_AMBULATORY_CARE_PROVIDER_SITE_OTHER): Payer: Medicare HMO | Admitting: Nurse Practitioner

## 2018-12-03 ENCOUNTER — Encounter: Payer: Self-pay | Admitting: Nurse Practitioner

## 2018-12-03 ENCOUNTER — Other Ambulatory Visit: Payer: Self-pay

## 2018-12-03 VITALS — BP 133/64 | HR 75 | Temp 98.4°F | Resp 16 | Ht 61.0 in | Wt 141.8 lb

## 2018-12-03 DIAGNOSIS — F419 Anxiety disorder, unspecified: Secondary | ICD-10-CM

## 2018-12-03 DIAGNOSIS — M5442 Lumbago with sciatica, left side: Secondary | ICD-10-CM

## 2018-12-03 DIAGNOSIS — G8929 Other chronic pain: Secondary | ICD-10-CM

## 2018-12-03 DIAGNOSIS — Z7689 Persons encountering health services in other specified circumstances: Secondary | ICD-10-CM

## 2018-12-03 DIAGNOSIS — F321 Major depressive disorder, single episode, moderate: Secondary | ICD-10-CM | POA: Diagnosis not present

## 2018-12-03 DIAGNOSIS — E118 Type 2 diabetes mellitus with unspecified complications: Secondary | ICD-10-CM | POA: Diagnosis not present

## 2018-12-03 DIAGNOSIS — M5441 Lumbago with sciatica, right side: Secondary | ICD-10-CM

## 2018-12-03 DIAGNOSIS — E1140 Type 2 diabetes mellitus with diabetic neuropathy, unspecified: Secondary | ICD-10-CM

## 2018-12-03 DIAGNOSIS — F32A Depression, unspecified: Secondary | ICD-10-CM

## 2018-12-03 DIAGNOSIS — K1120 Sialoadenitis, unspecified: Secondary | ICD-10-CM

## 2018-12-03 DIAGNOSIS — N924 Excessive bleeding in the premenopausal period: Secondary | ICD-10-CM | POA: Diagnosis not present

## 2018-12-03 DIAGNOSIS — Z794 Long term (current) use of insulin: Secondary | ICD-10-CM

## 2018-12-03 DIAGNOSIS — F329 Major depressive disorder, single episode, unspecified: Secondary | ICD-10-CM

## 2018-12-03 MED ORDER — TRULICITY 0.75 MG/0.5ML ~~LOC~~ SOAJ: 0.7500 mg | SUBCUTANEOUS | 1 refills | Status: DC

## 2018-12-03 MED ORDER — CLINDAMYCIN HCL 150 MG PO CAPS: 450.0000 mg | ORAL_CAPSULE | Freq: Three times a day (TID) | ORAL | 0 refills | Status: AC

## 2018-12-03 MED ORDER — ATORVASTATIN CALCIUM 10 MG PO TABS: 10.0000 mg | ORAL_TABLET | Freq: Every day | ORAL | 1 refills | Status: DC

## 2018-12-03 MED ORDER — OMEGA-3-ACID ETHYL ESTERS 1 G PO CAPS: 1.0000 g | ORAL_CAPSULE | Freq: Two times a day (BID) | ORAL | 1 refills | Status: DC

## 2018-12-03 MED ORDER — MEDROXYPROGESTERONE ACETATE 10 MG PO TABS: 10.0000 mg | ORAL_TABLET | Freq: Every day | ORAL | 11 refills | Status: DC

## 2018-12-03 MED ORDER — METFORMIN HCL 1000 MG PO TABS: ORAL_TABLET | ORAL | 1 refills | Status: DC

## 2018-12-03 MED ORDER — GABAPENTIN 600 MG PO TABS: 600.0000 mg | ORAL_TABLET | Freq: Three times a day (TID) | ORAL | 1 refills | Status: DC

## 2018-12-03 MED ORDER — TIZANIDINE HCL 4 MG PO TABS: 4.0000 mg | ORAL_TABLET | Freq: Two times a day (BID) | ORAL | 1 refills | Status: DC | PRN

## 2018-12-03 NOTE — Progress Notes (Signed)
Subjective:    Patient ID: Brandi Calderon, female    DOB: [birth year], 53 y.o.   MRN: [id number]  Brandi Calderon is a 53 y.o. female presenting on 12/03/2018 for Diabetes (Type 2) and Establish Care  HPI Establish Care New Provider Pt last seen by PCP Dr Jeanie Cooks at [facility] on [address] about 1-2 months ago.  Obtain records.  Wants a provider closer to home.   - Patient was seeing Dr. Leonides Schanz ([place] in [place]) - Psychiatrist; Therapist - [place]  Type 2 DM Pt has Type 2 diabetes mellitus. She is not checking CBG at home - Current diabetic medications include: metformin and trulicity A999333 mg weekly - She is not currently symptomatic.  - She denies polydipsia, polyphagia, polyuria, headaches, diaphoresis, shakiness, chills, pain, numbness or tingling in extremities and changes in vision.   - Clinical course has been stable. - She  reports no regular exercise routine. - Her diet is moderate in salt, moderate in fat, and moderate in carbohydrates.  Salivary gland swelling Patient reports prior problems with salivary gland pain.  She has taken antibiotics in the past.  Patient returned about a week ago.  Very salty taste in her mouth when she eats or presses on the left side of her face.  Patient denies fever, chills, sweats.  She did not see ear nose and throat after the last episode of parotitis.  Anxiety and depression She currently is seeing psychiatry at [facility].  She currently takes bupropion 150 mg daily and clonazepam 0.5 mg twice daily as needed anxiety.  Sometimes, patient states she takes clonazepam 3 times a day.  Patient states she has a Transport planner and psychiatrist [place].  She would like to have only one doctor.  Her last medication change was at her last visit.  Patient states tired of having a drug test every time she goes.  Patient was also not pleased with her provider wanting to stop her clonazepam.    Depression screen [facility] 2/9 12/03/2018 06/25/2018  03/11/2016  Decreased Interest 1 1 3   Down, Depressed, Hopeless 1 0 3  PHQ - 2 Score 2 1 6   Altered sleeping 3 - 3  Tired, decreased energy 2 - 3  Change in appetite 1 - 0  Feeling bad or failure about yourself  0 - 0  Trouble concentrating 1 - 3  Moving slowly or fidgety/restless 1 - 0  Suicidal thoughts 0 - 0  PHQ-9 Score 10 - 15  Difficult doing work/chores Extremely dIfficult - -    Past Medical History:  Diagnosis Date  . Anxiety   . Arthritis   . Asthma   . Back pain   . COPD (chronic obstructive pulmonary disease) ([place])   . Depression   . Diabetes mellitus   . Diabetes mellitus without complication ([place])   . Headache   . Hyperlipidemia   . Hypertension   . Tendonitis    Past Surgical History:  Procedure Laterality Date  . CESAREAN SECTION    . CESAREAN SECTION     3 times  . cyst on neck     as a baby/right side   Social History   Socioeconomic History  . Marital status: Divorced    Spouse name: Not on file  . Number of children: Not on file  . Years of education: Not on file  . Highest education level: Not on file  Occupational History  . Not on file  Social Needs  .  Financial resource strain: Not on file  . Food insecurity    Worry: Not on file    Inability: Not on file  . Transportation needs    Medical: Not on file    Non-medical: Not on file  Tobacco Use  . Smoking status: Current Every Day Smoker    Packs/day: 3.00    Years: 33.00    Pack years: 99.00    Types: Cigarettes  . Smokeless tobacco: Never Used  Substance and Sexual Activity  . Alcohol use: No  . Drug use: Yes    Types: Marijuana    Comment: States she smokes MJ when she does not have any Tramadol.  . Sexual activity: Not on file  Lifestyle  . Physical activity    Days per week: Not on file    Minutes per session: Not on file  . Stress: Not on file  Relationships  . Social Herbalist on phone: Not on file    Gets together: Not on file    Attends religious  service: Not on file    Active member of club or organization: Not on file    Attends meetings of clubs or organizations: Not on file    Relationship status: Not on file  . Intimate partner violence    Fear of current or ex partner: Not on file    Emotionally abused: Not on file    Physically abused: Not on file    Forced sexual activity: Not on file  Other Topics Concern  . Not on file  Social History Narrative   ** Merged History Encounter **       In the process of getting SSD for depression, DM and neuropathy   Family History  Problem Relation Age of Onset  . Kidney disease Father   . Diabetes Father   . Dementia Father   . Diabetes Sister   . Diabetes Brother   . Diabetes Mother   . Diabetes Brother    Current Outpatient Medications on File Prior to Visit  Medication Sig  . Albuterol Sulfate 108 (90 Base) MCG/ACT AEPB Inhale into the lungs. Use 2 puffs 4 times a day prn  . Aspirin-Salicylamide-Caffeine (BC HEADACHE POWDER PO) Take 4 packets by mouth daily as needed (pain).  [name] Kitchen atorvastatin (LIPITOR) 10 MG tablet   . buPROPion (WELLBUTRIN XL) 150 MG 24 hr tablet   . citalopram (CELEXA) 20 MG tablet Take 40 mg by mouth 2 (two) times daily.   . clonazePAM (KLONOPIN) 0.5 MG tablet Take 0.5 mg by mouth 2 (two) times daily as needed for anxiety (Sometimes takes 3 tablets a day).   . Dulaglutide (TRULICITY) A999333 0000000 SOPN Inject into the skin once a week.  . Fluticasone Propionate (FLONASE NA) Place into the nose as needed. Use 2 spray in each nostril prn  . gabapentin (NEURONTIN) 600 MG tablet TAKE TWO TABLETS (1200 MG) BY MOUTH 3 TIMES A DAY (Patient taking differently: Take one pill tid)  . medroxyPROGESTERone (PROVERA) 10 MG tablet Take 1 tablet (10 mg total) by mouth daily.  . metFORMIN (GLUCOPHAGE) 1000 MG tablet TAKE ONE TABLET BY MOUTH 2 TIMES A DAY WITH A MEAL  . sitaGLIPtin (JANUVIA) 100 MG tablet Take 100 mg by mouth daily.  [name] Kitchen tiZANidine (ZANAFLEX) 4 MG tablet Take  4 mg by mouth 2 (two) times daily as needed for muscle spasms.  . traMADol (ULTRAM) 50 MG tablet Take by mouth every 6 (six) hours as needed.  [name] Kitchen  Omega-3 Fatty Acids (FISH OIL) 1000 MG CAPS Take 1 capsule (1,000 mg total) by mouth daily. Reported on 09/19/2015 (Patient not taking: Reported on 12/03/2018)   No current facility-administered medications on file prior to visit.     Review of Systems  Constitutional: Negative for chills and fever.  HENT: Positive for facial swelling (left side of jaw, face, neck). Negative for congestion and sore throat.   Eyes: Negative for pain.  Respiratory: Negative for cough, shortness of breath and wheezing.   Cardiovascular: Negative for chest pain, palpitations and leg swelling.  Gastrointestinal: Negative for abdominal pain, blood in stool, constipation, diarrhea, nausea and vomiting.  Endocrine: Negative for polydipsia.  Genitourinary: Negative for dysuria, frequency, hematuria and urgency.  Musculoskeletal: Negative for back pain, myalgias and neck pain.  Skin: Negative.  Negative for rash.  Allergic/Immunologic: Negative for environmental allergies.  Neurological: Negative for dizziness, weakness and headaches.  Hematological: Does not bruise/bleed easily.  Psychiatric/Behavioral: Positive for decreased concentration and dysphoric mood. Negative for self-injury, sleep disturbance and suicidal ideas. The patient is nervous/anxious.    Per HPI unless specifically indicated above     Objective:    BP 133/64 (BP Location: Right Arm, Patient Position: Sitting, Cuff Size: Normal)   Pulse 75   Temp 98.4 F (36.9 C) (Oral)   Resp 16   Ht 5\' 1"  (1.549 m)   Wt 141 lb 12.8 oz (64.3 kg)   SpO2 100%   BMI 26.79 kg/m   Wt Readings from Last 3 Encounters:  12/03/18 141 lb 12.8 oz (64.3 kg)  06/25/18 150 lb (68 kg)  05/20/18 152 lb (68.9 kg)    Physical Exam Vitals signs reviewed.  Constitutional:      General: She is not in acute distress.     Appearance: She is well-developed.  HENT:     Head: Normocephalic and atraumatic.  Neck:     Musculoskeletal: Full passive range of motion without pain and normal range of motion. No pain with movement.  Cardiovascular:     Rate and Rhythm: Normal rate and regular rhythm.     Pulses:          Radial pulses are 2+ on the right side and 2+ on the left side.       Posterior tibial pulses are 1+ on the right side and 1+ on the left side.     Heart sounds: Normal heart sounds, S1 normal and S2 normal.  Pulmonary:     Effort: Pulmonary effort is normal. No respiratory distress.     Breath sounds: Normal breath sounds and air entry.  Musculoskeletal:     Right lower leg: No edema.     Left lower leg: No edema.  Lymphadenopathy:     Cervical: Cervical adenopathy present.     Right cervical: No superficial, deep or posterior cervical adenopathy.    Left cervical: Deep cervical adenopathy and posterior cervical adenopathy present. No superficial cervical adenopathy.  Skin:    General: Skin is warm and dry.     Capillary Refill: Capillary refill takes less than 2 seconds.  Neurological:     Mental Status: She is alert and oriented to person, place, and time.  Psychiatric:        Attention and Perception: Attention normal.        Mood and Affect: Mood is anxious. Affect is labile.        Speech: Speech normal.        Behavior: Behavior normal. Behavior is  cooperative.        Thought Content: Thought content does not include homicidal or suicidal ideation. Thought content does not include homicidal or suicidal plan.        Cognition and Memory: Cognition normal.        Judgment: Judgment is not impulsive or inappropriate.    Results for orders placed or performed in visit on 06/25/18  Compliance Drug Analysis, Ur  Result Value Ref Range   Summary FINAL       Assessment & Plan:   Problem List Items Addressed This Visit      Nervous and Auditory   Neuropathy due to type 2 diabetes  mellitus ([place]) - severe (Chronic) Patient needing refills.  Refills provided.  Follow-up with more detail in 4 weeks.   Relevant Medications   atorvastatin (LIPITOR) 10 MG tablet   gabapentin (NEURONTIN) 600 MG tablet   Dulaglutide (TRULICITY) A999333 0000000 SOPN   metFORMIN (GLUCOPHAGE) 1000 MG tablet     Other   Back pain Stable without significant changes.  Continues on tizanidine.  Refills provided.  Follow-up 3 months and prn.   Relevant Medications   tiZANidine (ZANAFLEX) 4 MG tablet   Menorrhagia - Primary Stable on progesterone.  Continue GYN follow-up.  Provided refills.  Follow-up 3 months.   Relevant Medications   medroxyPROGESTERone (PROVERA) 10 MG tablet   Depression, major, single episode, moderate (HCC)   Anxiety and depression Patient needs to remain with psychiatry for management.  Patient with active prescriber of clonazepam.  Will not take over this prescription.  Patient verbalizes understanding.  Encouraged to continue current psychiatry.  Will attempt to find better fit for patient in another office.  Referral provided today.  Discused reasons to consider reducing or stopping her clonazepam.  Patient does not desire to stop today.  States she would need something else for her anxiety.  Follow-up prn only.     Relevant Orders   Ambulatory referral to Psychiatry    Other Visit Diagnoses    Type 2 diabetes mellitus with complication, with long-term current use of insulin ([place])     Status unknown.  Recheck labs at next visit.  Continue meds without changes today.  Refills provided. Followup 4 weeks months.    Relevant Medications   atorvastatin (LIPITOR) 10 MG tablet   Dulaglutide (TRULICITY) A999333 0000000 SOPN   metFORMIN (GLUCOPHAGE) 1000 MG tablet   omega-3 acid ethyl esters (LOVAZA) 1 g capsule   Establish new provider Previous PCP was at [facility].  Records will be requested.  Past medical, family, and surgical history reviewed w/ pt.    Parotitis      Acute parotitis.  Reviewed emergency signs and symptoms.  Referral made to ENT for recurrence.  Patient informed she would need to have neg Covid test prior to appt.  Verbalizes understanding.  Follow-up prn no improvement 3-5 days.   Relevant Medications   clindamycin (CLEOCIN) 150 MG capsule   Other Relevant Orders   Ambulatory referral to ENT      Meds ordered this encounter  Medications  . atorvastatin (LIPITOR) 10 MG tablet    Sig: Take 1 tablet (10 mg total) by mouth daily at 6 PM.    Dispense:  90 tablet    Refill:  1    Order Specific Question:   Supervising Provider    Answer:   Olin Hauser [[id number]]  . gabapentin (NEURONTIN) 600 MG tablet    Sig: Take 1 tablet (600 mg  total) by mouth 3 (three) times daily.    Dispense:  270 tablet    Refill:  1    Order Specific Question:   Supervising Provider    Answer:   Olin Hauser [[id number]]  . Dulaglutide (TRULICITY) A999333 0000000 SOPN    Sig: Inject 0.75 mg into the skin once a week.    Dispense:  12 pen    Refill:  1    Order Specific Question:   Supervising Provider    Answer:   Olin Hauser [[id number]]  . medroxyPROGESTERone (PROVERA) 10 MG tablet    Sig: Take 1 tablet (10 mg total) by mouth daily.    Dispense:  30 tablet    Refill:  11    Order Specific Question:   Supervising Provider    Answer:   Olin Hauser [[id number]]  . metFORMIN (GLUCOPHAGE) 1000 MG tablet    Sig: TAKE ONE TABLET BY MOUTH 2 TIMES A DAY WITH A MEAL    Dispense:  180 tablet    Refill:  1    Order Specific Question:   Supervising Provider    Answer:   Olin Hauser [[id number]]  . tiZANidine (ZANAFLEX) 4 MG tablet    Sig: Take 1 tablet (4 mg total) by mouth 2 (two) times daily as needed for muscle spasms.    Dispense:  30 tablet    Refill:  1    Order Specific Question:   Supervising Provider    Answer:   Olin Hauser [[id number]]  . omega-3 acid ethyl esters (LOVAZA) 1 g capsule    Sig: Take 1 capsule  (1 g total) by mouth 2 (two) times daily.    Dispense:  180 capsule    Refill:  1    Order Specific Question:   Supervising Provider    Answer:   Olin Hauser [[id number]]  . clindamycin (CLEOCIN) 150 MG capsule    Sig: Take 3 capsules (450 mg total) by mouth 3 (three) times daily for 10 days.    Dispense:  90 capsule    Refill:  0    Order Specific Question:   Supervising Provider    Answer:   Olin Hauser [[id number]]     Follow up plan: Return in about 4 weeks (around 12/31/2018) for diabetes.  Cassell Smiles, DNP, AGPCNP-BC Adult Gerontology Primary Care Nurse Practitioner [facility] 12/03/2018, 2:06 PM

## 2018-12-03 NOTE — Patient Instructions (Addendum)
[name],   Thank you for coming in to clinic today.  1. Continue seeing your current psychiatrist and therapist until we can try to get a new referral placed at [facility].  Internal Referral: [facility] [place] ([place] [facility]) Address: [address], [place], [place] [address] Hours: 8:30AM-5PM Phone: [phone]  Self Referral [facility], available walk-in 9am-4pm M-F [place], [place] [address] Hours: [place] (M-F, walk in available) [phone]  Therapist Self Referral: 1. Karen [place]   Address: [place], [place], [place] [address] Hours: Open today  9AM-7PM Phone: [phone]  2. [place], [place] Address: [address] [place], [place], [place] [address] Phone: [phone]  Please schedule a follow-up appointment with Cassell Smiles, AGNP. Return in about 4 weeks (around 12/31/2018) for diabetes.  If you have any other questions or concerns, please feel free to call the clinic or send a message through [place]. You may also schedule an earlier appointment if necessary.  You will receive a survey after today's visit either digitally by e-mail or paper by [employer]. Your experiences and feedback matter to [place].  Please respond so we know how we are doing as we provide care for you.  Cassell Smiles, DNP, AGNP-BC Adult Gerontology Nurse Practitioner [place]

## 2018-12-10 ENCOUNTER — Encounter: Payer: Self-pay | Admitting: Nurse Practitioner

## 2018-12-31 ENCOUNTER — Ambulatory Visit: Payer: Medicare HMO | Admitting: Nurse Practitioner

## 2019-01-04 ENCOUNTER — Other Ambulatory Visit: Payer: Self-pay

## 2019-01-04 ENCOUNTER — Encounter: Payer: Self-pay | Admitting: Nurse Practitioner

## 2019-01-04 ENCOUNTER — Ambulatory Visit (INDEPENDENT_AMBULATORY_CARE_PROVIDER_SITE_OTHER): Payer: Medicare HMO | Admitting: Nurse Practitioner

## 2019-01-04 VITALS — BP 118/73 | HR 67 | Ht 61.0 in | Wt 145.4 lb

## 2019-01-04 DIAGNOSIS — Z794 Long term (current) use of insulin: Secondary | ICD-10-CM

## 2019-01-04 DIAGNOSIS — M19242 Secondary osteoarthritis, left hand: Secondary | ICD-10-CM

## 2019-01-04 DIAGNOSIS — Z23 Encounter for immunization: Secondary | ICD-10-CM | POA: Diagnosis not present

## 2019-01-04 DIAGNOSIS — M19241 Secondary osteoarthritis, right hand: Secondary | ICD-10-CM

## 2019-01-04 DIAGNOSIS — E118 Type 2 diabetes mellitus with unspecified complications: Secondary | ICD-10-CM | POA: Diagnosis not present

## 2019-01-04 DIAGNOSIS — J418 Mixed simple and mucopurulent chronic bronchitis: Secondary | ICD-10-CM

## 2019-01-04 LAB — POCT GLYCOSYLATED HEMOGLOBIN (HGB A1C): Hemoglobin A1C: 7.2 % — AB (ref 4.0–5.6)

## 2019-01-04 MED ORDER — TRELEGY ELLIPTA 100-62.5-25 MCG/INH IN AEPB: 1.0000 | INHALATION_SPRAY | Freq: Every day | RESPIRATORY_TRACT | 5 refills | Status: DC

## 2019-01-04 MED ORDER — DICLOFENAC SODIUM 1 % TD GEL: 2.0000 g | Freq: Four times a day (QID) | TRANSDERMAL | 1 refills | Status: DC | PRN

## 2019-01-04 MED ORDER — TRULICITY 1.5 MG/0.5ML ~~LOC~~ SOAJ: 1.5000 mg | SUBCUTANEOUS | 1 refills | Status: DC

## 2019-01-04 NOTE — Patient Instructions (Addendum)
Brandi Calderon,   Thank you for coming in to clinic today.  1. STOP Januvia  2. INCREASE Trulicity to 1.5 mg per week. Until you are finished with your current medication supply, Inject two pens on the same day on the same side of your abdomen.    3. You need a third medication in your daily inhaler.  We are going to try to get Trelegy covered.  If it is, you will stop Symbicort and take Trelegy instead. - continue albuterol as needed for wheezing/shortness of breath.  Your provider would like to you have your annual eye exam. Please contact your current eye doctor or here are some good options for you to contact.   [facility]   Address: [address] [place], [place] [address] Phone: [phone]  Website: [email or web address] [address] [place], [place] [address] Phone: [phone] [email or web address]  [facility]  Address: [place], [place], [place] [address] Phone: [phone]   [facility] [address] [place], [place] [address] Phone: [phone]  [facility] Address: [place], [place], [place] [address]  Phone: [phone]  [facility]  Please schedule a follow-up appointment. Return in about 3 months (around 04/06/2019) for diabetes, COPD.  If you have any other questions or concerns, please feel free to call the clinic or send a message through [place]. You may also schedule an earlier appointment if necessary.  You will receive a survey after today's visit either digitally by e-mail or paper by [employer]. Your experiences and feedback matter to [place].  Please respond so we know how we are doing as we provide care for you.  Cassell Smiles, DNP, AGNP-BC Adult Gerontology Nurse Practitioner [place]

## 2019-01-04 NOTE — Progress Notes (Signed)
Subjective:    Patient ID: Brandi Calderon, female    DOB: [birth year], 53 y.o.   MRN: [id number]  Brandi Calderon is a 53 y.o. female presenting on 01/04/2019 for Diabetes  HPI Diabetes Pt presents today for follow up of Type 2 diabetes mellitus. She is not checking CBG at home regularly - Current diabetic medications include: metformin 123XX123 mg bid, Trulicity 0.75mg  weekly, sitagliptin 100 mg daily - She is not currently symptomatic.  - She denies polydipsia, polyphagia, polyuria, headaches, diaphoresis, shakiness, chills, pain, numbness or tingling in extremities and changes in vision.   - Clinical course has been stable. - She  reports no regular exercise routine. - Her diet is moderate in salt, moderate in fat, and moderate in carbohydrates. - Weight trend: stable  PREVENTION: Eye exam current (within one year): no Foot exam current (within one year): no  Lipid/ASCVD risk reduction - on statin: atorvastatin Kidney protection - on ace or arb: no Recent Labs    01/04/19 0855  HGBA1C 7.2*    COPD Patient is currently using Symbicort and albuterol ordered prn for breathing.  Patient is actually using albuterol every day twice per day.  When she starts walking, she starts gasping for breath every time.   - Smoker: 2 ppd  Social History   Tobacco Use  . Smoking status: Current Every Day Smoker    Packs/day: 3.00    Years: 33.00    Pack years: 99.00    Types: Cigarettes  . Smokeless tobacco: Never Used  Substance Use Topics  . Alcohol use: No  . Drug use: Yes    Types: Marijuana    Comment: States she smokes MJ when she does not have any Tramadol.    Review of Systems Per HPI unless specifically indicated above     Objective:    BP 118/73 (BP Location: Left Arm, Patient Position: Sitting, Cuff Size: Normal)   Pulse 67   Ht 5\' 1"  (1.549 m)   Wt 145 lb 6.4 oz (66 kg)   BMI 27.47 kg/m   Wt Readings from Last 3 Encounters:  01/04/19 145 lb 6.4 oz (66 kg)   12/03/18 141 lb 12.8 oz (64.3 kg)  06/25/18 150 lb (68 kg)    Physical Exam Vitals signs reviewed.  Constitutional:      General: She is awake. She is not in acute distress.    Appearance: She is well-developed. She is obese.  HENT:     Head: Normocephalic and atraumatic.     Right Ear: Tympanic membrane, ear canal and external ear normal.     Left Ear: Tympanic membrane, ear canal and external ear normal.     Nose: Nose normal.     Mouth/Throat:     Mouth: Mucous membranes are moist.     Pharynx: Oropharynx is clear.  Neck:     Musculoskeletal: Normal range of motion and neck supple.     Vascular: No carotid bruit.  Cardiovascular:     Rate and Rhythm: Normal rate and regular rhythm.     Pulses:          Radial pulses are 2+ on the right side and 2+ on the left side.       Posterior tibial pulses are 1+ on the right side and 1+ on the left side.     Heart sounds: Normal heart sounds, S1 normal and S2 normal.  Pulmonary:     Effort: Pulmonary effort is normal. No respiratory distress.  Breath sounds: Normal air entry. No stridor. Wheezing present. No rhonchi or rales.  Skin:    General: Skin is warm and dry.     Capillary Refill: Capillary refill takes less than 2 seconds.  Neurological:     General: No focal deficit present.     Mental Status: She is alert and oriented to person, place, and time. Mental status is at baseline.  Psychiatric:        Attention and Perception: Attention normal.        Mood and Affect: Mood and affect normal.        Behavior: Behavior normal. Behavior is cooperative.        Thought Content: Thought content normal.        Judgment: Judgment normal.    Results for orders placed or performed in visit on 06/25/18  Compliance Drug Analysis, Ur  Result Value Ref Range   Summary FINAL       Assessment & Plan:   Problem List Items Addressed This Visit    None    Visit Diagnoses    Type 2 diabetes mellitus with complication, with long-term  current use of insulin ([place])    -  Primary Controlled diabetes, but remains slightly above goal.  A1c goal < 7.0%.  Patient also on duplicate therapy DPP4 and GLP1.  Plan:  - STOP Januvia - Increase Trulicity to 1.5 mg injection weekly. - Labs today. - Encouraged improved diet, increased physical activity - Needs ophthalmology exam - Continue atorvastatin 20 mg daily - Consider adding Ace/arb - patient declines today.  Needs urine microalbumin - Follow-up 3 months.   Relevant Medications   Dulaglutide (TRULICITY) 1.5 0000000 SOPN   Other Relevant Orders   POCT glycosylated hemoglobin (Hb A1C) (Completed)   Lipid panel (Completed)   Comprehensive metabolic panel (Completed)   Needs flu shot       Relevant Orders   Flu Vaccine QUAD 6+ mos PF IM (Fluarix Quad PF) (Completed)   Other secondary osteoarthritis of both hands     Chronic, persistent hand pain not affecting single joint.  Decreased grip strength. - Continue diclofenac 75 mg tab bid prn - START diclofenac gel 1% qid prn   Relevant Medications   diclofenac (VOLTAREN) 75 MG EC tablet   diclofenac sodium (VOLTAREN) 1 % GEL   Mixed simple and mucopurulent chronic bronchitis (HCC)     Uncontrolled.  Patient continues having shortness of breath, worse with exertion - STOP Symbicort - START Trelegy. - Encourage regular physical activity despite shortness of breath and pace activity. - Follow-up 3 months.   Relevant Medications   Fluticasone-Umeclidin-Vilant (TRELEGY ELLIPTA) 100-62.5-25 MCG/INH AEPB      Meds ordered this encounter  Medications  . diclofenac sodium (VOLTAREN) 1 % GEL    Sig: Apply 2 g topically 4 (four) times daily as needed (moderate pain in hands).    Dispense:  150 g    Refill:  1    Order Specific Question:   Supervising Provider    Answer:   Olin Hauser [[id number]]  . Dulaglutide (TRULICITY) 1.5 0000000 SOPN    Sig: Inject 1.5 mg into the skin once a week.    Dispense:  12 pen     Refill:  1    Do not fill until patient requests refill    Order Specific Question:   Supervising Provider    Answer:   Olin Hauser [[id number]]  . Fluticasone-Umeclidin-Vilant (TRELEGY ELLIPTA) 100-62.5-25 MCG/INH AEPB  Sig: Inhale 1 puff into the lungs daily.    Dispense:  30 each    Refill:  5    Order Specific Question:   Supervising Provider    Answer:   Olin Hauser [[id number]]   Follow up plan: Return in about 3 months (around 04/06/2019) for diabetes, COPD.  Cassell Smiles, DNP, AGPCNP-BC Adult Gerontology Primary Care Nurse Practitioner [facility] 01/04/2019, 8:54 AM

## 2019-01-05 ENCOUNTER — Other Ambulatory Visit: Payer: Self-pay | Admitting: Nurse Practitioner

## 2019-01-05 DIAGNOSIS — Z794 Long term (current) use of insulin: Secondary | ICD-10-CM

## 2019-01-05 DIAGNOSIS — E118 Type 2 diabetes mellitus with unspecified complications: Secondary | ICD-10-CM

## 2019-01-05 LAB — COMPREHENSIVE METABOLIC PANEL
AG Ratio: 1.5 (calc) (ref 1.0–2.5)
ALT: 8 U/L (ref 6–29)
AST: 11 U/L (ref 10–35)
Albumin: 4.3 g/dL (ref 3.6–5.1)
Alkaline phosphatase (APISO): 87 U/L (ref 37–153)
BUN: 15 mg/dL (ref 7–25)
CO2: 27 mmol/L (ref 20–32)
Calcium: 9.5 mg/dL (ref 8.6–10.4)
Chloride: 105 mmol/L (ref 98–110)
Creat: 0.74 mg/dL (ref 0.50–1.05)
Globulin: 2.8 g/dL (calc) (ref 1.9–3.7)
Glucose, Bld: 138 mg/dL — ABNORMAL HIGH (ref 65–99)
Potassium: 4.8 mmol/L (ref 3.5–5.3)
Sodium: 140 mmol/L (ref 135–146)
Total Bilirubin: 0.3 mg/dL (ref 0.2–1.2)
Total Protein: 7.1 g/dL (ref 6.1–8.1)

## 2019-01-05 LAB — LIPID PANEL
Cholesterol: 205 mg/dL — ABNORMAL HIGH (ref <200)
HDL: 40 mg/dL — ABNORMAL LOW (ref >=50)
LDL Cholesterol (Calc): 146 mg/dL (calc) — ABNORMAL HIGH
Non-HDL Cholesterol (Calc): 165 mg/dL (calc) — ABNORMAL HIGH (ref <130)
Total CHOL/HDL Ratio: 5.1 (calc) — ABNORMAL HIGH (ref <5.0)
Triglycerides: 87 mg/dL (ref <150)

## 2019-01-05 MED ORDER — ATORVASTATIN CALCIUM 20 MG PO TABS: 20.0000 mg | ORAL_TABLET | Freq: Every day | ORAL | 1 refills | Status: DC

## 2019-01-07 MED ORDER — DICLOFENAC SODIUM 1 % TD GEL: 2.0000 g | Freq: Four times a day (QID) | TRANSDERMAL | 1 refills | Status: DC | PRN

## 2019-01-11 ENCOUNTER — Encounter: Payer: Self-pay | Admitting: Nurse Practitioner

## 2019-02-18 LAB — HM DIABETES EYE EXAM

## 2019-02-19 ENCOUNTER — Encounter: Payer: Self-pay | Admitting: Family Medicine

## 2019-03-09 ENCOUNTER — Other Ambulatory Visit: Payer: Self-pay | Admitting: Nurse Practitioner

## 2019-03-09 NOTE — Telephone Encounter (Signed)
The pt was notified and verbalize understanding.  

## 2019-03-09 NOTE — Telephone Encounter (Signed)
Pt.wanted to know if you would take over prescription clonazepam.She said that she was max out on this  psych Dr.

## 2019-03-09 NOTE — Telephone Encounter (Signed)
I would not plan to take over rx clonazepam from her psychiatry.  Last review of previous PCP Cassell Smiles, AGPCNP-BC note on 12/03/18 says that patient should remain with psychiatry for management given her complex history. She declined to take over Clonazepam at that time as well.  Nobie Putnam, DO [facility] 03/09/2019, 11:10 AM

## 2019-05-26 ENCOUNTER — Other Ambulatory Visit: Payer: Self-pay | Admitting: Nurse Practitioner

## 2019-05-26 DIAGNOSIS — Z794 Long term (current) use of insulin: Secondary | ICD-10-CM

## 2019-05-26 DIAGNOSIS — E118 Type 2 diabetes mellitus with unspecified complications: Secondary | ICD-10-CM

## 2019-06-02 ENCOUNTER — Other Ambulatory Visit: Payer: Self-pay

## 2019-06-02 ENCOUNTER — Encounter: Payer: Self-pay | Admitting: Family Medicine

## 2019-06-02 ENCOUNTER — Ambulatory Visit (INDEPENDENT_AMBULATORY_CARE_PROVIDER_SITE_OTHER): Payer: Medicare HMO | Admitting: Family Medicine

## 2019-06-02 ENCOUNTER — Ambulatory Visit: Payer: Medicare HMO | Admitting: Family Medicine

## 2019-06-02 VITALS — BP 122/67 | HR 75 | Temp 97.7°F | Resp 16 | Ht 61.0 in | Wt 147.2 lb

## 2019-06-02 DIAGNOSIS — G43709 Chronic migraine without aura, not intractable, without status migrainosus: Secondary | ICD-10-CM

## 2019-06-02 DIAGNOSIS — E118 Type 2 diabetes mellitus with unspecified complications: Secondary | ICD-10-CM | POA: Diagnosis not present

## 2019-06-02 DIAGNOSIS — F331 Major depressive disorder, recurrent, moderate: Secondary | ICD-10-CM | POA: Diagnosis not present

## 2019-06-02 DIAGNOSIS — Z794 Long term (current) use of insulin: Secondary | ICD-10-CM

## 2019-06-02 LAB — POCT GLYCOSYLATED HEMOGLOBIN (HGB A1C): Hemoglobin A1C: 8.3 % — AB (ref 4.0–5.6)

## 2019-06-02 MED ORDER — SUMATRIPTAN SUCCINATE 50 MG PO TABS: 50.0000 mg | ORAL_TABLET | Freq: Once | ORAL | 2 refills | Status: DC | PRN

## 2019-06-02 NOTE — Patient Instructions (Addendum)
Thank you for coming to the office today.  Increase Trulicity from 1.5 once weekly, now to 2 doses one on each side, a week. After 1 month, we can order NEW rx stronger dose 3mg  ONCE weekly. Call or message.  Recent Labs    01/04/19 0855 06/02/19 1437  HGBA1C 7.2* 8.3*     Treatment to STOP the headache at this time: - Start with Sumatriptan 50mg  - take 1 immediately at onset of moderate to severe migraine headache, if unresolved or return within 2 hours then repeat dose 1 tablet, that is max dose for 24 hours. (Note - this medication can cause a brief episode of flushing and chest pressure or pain very soon after taking it. That is NORMAL, and it is the medicine taking effect and dilating some blood vessels. It should pass, and resolve within seconds to minutes after - it may not happen at all)  - Try this for 1-2 weeks, if absolutely NOT helping then contact me to discuss changes, possibly can double sumatriptan dose or try nasal spray - You can still take over the counter meds with Ibuprofen up to 600-800mg  per dose 3 times a day with food for a few days and may try Excedrin Migraine as needed, ONLY use these after you have tried Sumatriptan up to 2 doses, and try to limit their use if they are not effective  Treatment to PREVENT headaches: - Goal is to avoid triggers. We need to learn more details on what are your exact or possible headache triggers first. - Keep detailed headache diary (on printed handout) for possible triggers, bring this to your next visit to discuss further - Known possible triggers include caffeine, chocolate, alcohol, stress, weather changes, menstrual cycle, certain other foods - Also be aware that OTC pain meds/anti-inflammatories can cause rebound headache, they help resolve the headache but then after the effect wears off they can CAUSE a headache. Try to taper down and stop these medications and allow them to get out of your system for 1-2 weeks    (these are  once a month self injection medications that work directly to block migraine signals. They are relatively new, can be costly and have been proven to be very safe and effective. We can try to get them approved or for low cost if needed in future.)   Consider an Oral Appliance for Snoring in future if interested.    Please schedule a Follow-up Appointment to: Return in about 3 months (around 09/02/2019) for 3 month DM A1c, Migraine medicine w/ Elmyra Ricks.  If you have any other questions or concerns, please feel free to call the office or send a message through [place]. You may also schedule an earlier appointment if necessary.  Additionally, you may be receiving a survey about your experience at our office within a few days to 1 week by e-mail or mail. We value your feedback.  Nobie Putnam, DO [place]

## 2019-06-02 NOTE — Progress Notes (Signed)
Subjective:    Patient ID: Brandi Calderon, female    DOB: 10-Oct-1965, 54 y.o.   MRN: [id number]  Brandi Calderon is a 54 y.o. female presenting on 06/02/2019 for Diabetes and Migraine (onset couple of days)  Previous PCP Cassell Smiles, AGPCNP-BC   HPI   CHRONIC DM, Type 2: Reports not checking sugars at home. Prior readings 8 range, now up to 8.3 CBGs: Unsure location of glucometer will check at home Meds: Metformin 1000mg  BID, Trulicity 1.5mg  weekly Reports good compliance. Tolerating well w/o side-effects - seems to have reduced appetite on trulicity Lifestyle: - Diet (not following low carb) - Exercise (limited due to back pain and COPD) Secondary neuropathy from  Denies hypoglycemia, polyuria, visual changes, numbness or tingling.  Migraine Headaches Chronic issue. Seems to be worsening over past few weeks. Improved with dark room and sleeping. Bilateral side headache with radiation down back. No significant associated symptoms. Has not been on imitrex in past. Taking OTC analgesia PRN without good results.  Major Depression recurrent chronic / with GAD Followed by [facility], Psych and Therapist Updated PHQ, GAD Recently just started sleeping pill from psychiatry unsure name. She is on Clonazepam, Citalopram, Bupropion XL currently.    Depression screen [facility] 2/9 06/02/2019 01/04/2019 12/03/2018  Decreased Interest 3 0 1  Down, Depressed, Hopeless 2 0 1  PHQ - 2 Score 5 0 2  Altered sleeping 3 3 3   Tired, decreased energy 3 1 2   Change in appetite 3 1 1   Feeling bad or failure about yourself  2 0 0  Trouble concentrating 3 0 1  Moving slowly or fidgety/restless 3 0 1  Suicidal thoughts 0 0 0  PHQ-9 Score 22 5 10   Difficult doing work/chores Very difficult Somewhat difficult Extremely dIfficult   GAD 7 : Generalized Anxiety Score 06/02/2019  Nervous, Anxious, on Edge 3  Control/stop worrying 3  Worry too much - different things 3  Trouble relaxing 3  Restless 3  Easily  annoyed or irritable 3  Afraid - awful might happen 0  Total GAD 7 Score 18  Anxiety Difficulty Very difficult     Social History   Tobacco Use  . Smoking status: Current Every Day Smoker    Packs/day: 3.00    Years: 33.00    Pack years: 99.00    Types: Cigarettes  . Smokeless tobacco: Never Used  Substance Use Topics  . Alcohol use: No  . Drug use: Yes    Types: Marijuana    Comment: States she smokes MJ when she does not have any Tramadol.    Review of Systems Per HPI unless specifically indicated above     Objective:    BP 122/67   Pulse 75   Temp 97.7 F (36.5 C) (Temporal)   Resp 16   Ht 5\' 1"  (1.549 m)   Wt 147 lb 3.2 oz (66.8 kg)   SpO2 100%   BMI 27.81 kg/m   Wt Readings from Last 3 Encounters:  06/02/19 147 lb 3.2 oz (66.8 kg)  01/04/19 145 lb 6.4 oz (66 kg)  12/03/18 141 lb 12.8 oz (64.3 kg)    Physical Exam Vitals and nursing note reviewed.  Constitutional:      General: She is not in acute distress.    Appearance: She is well-developed. She is not diaphoretic.     Comments: Well-appearing, comfortable, cooperative  HENT:     Head: Normocephalic and atraumatic.  Eyes:     General:  Right eye: No discharge.        Left eye: No discharge.     Conjunctiva/sclera: Conjunctivae normal.  Cardiovascular:     Rate and Rhythm: Normal rate.  Pulmonary:     Effort: Pulmonary effort is normal.  Skin:    General: Skin is warm and dry.     Findings: No erythema or rash.  Neurological:     Mental Status: She is alert and oriented to person, place, and time.  Psychiatric:        Behavior: Behavior normal.     Comments: Well groomed, good eye contact, normal speech and thoughts     Recent Labs    01/04/19 0855 06/02/19 1437  HGBA1C 7.2* 8.3*     Results for orders placed or performed in visit on 06/02/19  POCT HgB A1C  Result Value Ref Range   Hemoglobin A1C 8.3 (A) 4.0 - 5.6 %      Assessment & Plan:   Problem List Items  Addressed This Visit    Depression, major, recurrent, moderate ([place]) Followed by Psychiatry / therapist at [facility] On med management currently Significantly elevated PHQ/GAD scores, some poor sleep and low energy Advised goal to improve her sugar and lifestyle and that can help improve her sleep/mood She can follow up with Psychiatry at [facility] for further management med adjustment     Other Visit Diagnoses    Type 2 diabetes mellitus with complication, with long-term current use of insulin ([place])    -  Primary  Uncontrolled DM with hyperglycemia, A1c 8.3, increased from prior 7.2 Complications - peripheral neuropathy, hyperlipidemia, depression, obesity  Plan:  1. Increase Trulicity from 1.5mg  weekly, now to 3mg  weekly, use current supply of medicine at home, of 1.5 dose, can double dose one on each side weekly for 1 month then if tolerating higher dose well, we can order 3mg  in one pen Trulicity weekly - Continue Metformin - Considered adding other med such as SGLT2 but deferred given goal to avoid another pill at this time. 2. Encourage improved lifestyle - low carb, low sugar diet, reduce portion size, regular exercise, patient does offer resistance to changing her diet  3. Check CBG , bring log to next visit for review 4. Continue Statin. Due for urine microalbumin in future since not on ACEi/ARB - Will also be due for DM Foot exam at future visit at that time will be due for updated exam for health maintenance     Relevant Medications   Dulaglutide (TRULICITY) 1.5 0000000 SOPN   Other Relevant Orders   POCT HgB A1C (Completed)   Chronic migraine without aura without status migrainosus, not intractable       Relevant Medications   SUMAtriptan (IMITREX) 50 MG tablet      Consistent with inc frequency migraines, known chronic problem - Currently without active HA, well-appearing, no focal neuro deficits, tolerating PO w/o n/v - Inadequately treated for migraine HA at home  Plan: 1.  Start abortive therapy with Sumatriptan 50mg  tabs - take 1 PRN (#9, 0 refill due to quantity limit), severe HA, may repeat dose within 2 hr if persistent, no more in 24 hours, in future can titrate dose to 50-100mg  if needed. Counseling on potential side effect / intolerance with chest discomfort acutely after taking sumatriptan 2. May use higher dose NSAID PRN if need for mild HA 3. Avoid triggers including foods, caffeine. Important to rest. Return criteria given for acute migraine, when to go to office vs  ED  Future can adjust triptan, or consider prophylaxis medications, or possibly CGRP inhibitor injection like Emgality, Ajovy, Aimovig. Or possibly oral CGRP Nurtec ODT for acute migraines.   Meds ordered this encounter  Medications  . SUMAtriptan (IMITREX) 50 MG tablet    Sig: Take 1 tablet (50 mg total) by mouth once as needed for up to 1 dose for migraine. May repeat one dose in 2 hours if headache persists, for max dose 24 hours    Dispense:  9 tablet    Refill:  2      Follow up plan: Return in about 3 months (around 09/02/2019) for 3 month DM A1c, Migraine medicine w/ Elmyra Ricks.   Nobie Putnam, [facility] 06/02/2019, 2:34 PM

## 2019-06-29 ENCOUNTER — Other Ambulatory Visit: Payer: Self-pay | Admitting: Nurse Practitioner

## 2019-06-29 DIAGNOSIS — E118 Type 2 diabetes mellitus with unspecified complications: Secondary | ICD-10-CM

## 2019-08-04 ENCOUNTER — Other Ambulatory Visit: Payer: Self-pay | Admitting: Nurse Practitioner

## 2019-08-04 ENCOUNTER — Telehealth: Payer: Self-pay | Admitting: Family Medicine

## 2019-08-04 ENCOUNTER — Other Ambulatory Visit: Payer: Self-pay | Admitting: Family Medicine

## 2019-08-04 DIAGNOSIS — E118 Type 2 diabetes mellitus with unspecified complications: Secondary | ICD-10-CM

## 2019-08-04 DIAGNOSIS — Z794 Long term (current) use of insulin: Secondary | ICD-10-CM

## 2019-08-04 MED ORDER — TRULICITY 1.5 MG/0.5ML ~~LOC~~ SOAJ: 3.0000 mg | SUBCUTANEOUS | 1 refills | Status: DC

## 2019-08-04 NOTE — Telephone Encounter (Signed)
Medication Refill - Medication: Dulaglutide (TRULICITY) 1.5 0000000 SOPN   Has the patient contacted their pharmacy? Yes.   (Agent: If no, request that the patient contact the pharmacy for the refill.) (Agent: If yes, when and what did the pharmacy advise?)  Preferred Pharmacy (with phone number or street name):  [id number] - [place], [place] [address]  [address] [place] [address]  Phone:  [phone] Fax:  [phone]      Agent: Please be advised that RX refills may take up to 3 business days. We ask that you follow-up with your pharmacy.

## 2019-08-09 ENCOUNTER — Other Ambulatory Visit: Payer: Self-pay | Admitting: Nurse Practitioner

## 2019-08-09 DIAGNOSIS — E118 Type 2 diabetes mellitus with unspecified complications: Secondary | ICD-10-CM

## 2019-08-09 DIAGNOSIS — Z794 Long term (current) use of insulin: Secondary | ICD-10-CM

## 2019-08-09 NOTE — Telephone Encounter (Signed)
Requested Prescriptions  Pending Prescriptions Disp Refills  . metFORMIN (GLUCOPHAGE) 1000 MG tablet [Pharmacy Med Name: METFORMIN HCL 1,000 MG TABLET] 180 tablet 1    Sig: TAKE ONE TABLET BY MOUTH 2 TIMES A DAY WITH A MEAL     Endocrinology:  Diabetes - Biguanides Failed - 08/09/2019  1:26 AM      Failed - HBA1C is between 0 and 7.9 and within 180 days    Hemoglobin A1C  Date Value Ref Range Status  06/02/2019 8.3 (A) 4.0 - 5.6 % Final  04/05/2015 11.6  Final   Hgb A1c MFr Bld  Date Value Ref Range Status  06/25/2017 8.4 (H) 4.8 - 5.6 % Final    Comment:             Prediabetes: 5.7 - 6.4          Diabetes: >6.4          Glycemic control for adults with diabetes: <7.0          Failed - eGFR in normal range and within 360 days    EGFR (African American)  Date Value Ref Range Status  12/07/2012 >60  Final   GFR calc Af Amer  Date Value Ref Range Status  10/10/2017 >60 >60 mL/min Final    Comment:    (NOTE) The eGFR has been calculated using the CKD EPI equation. This calculation has not been validated in all clinical situations. eGFR's persistently <60 mL/min signify possible Chronic Kidney Disease.    EGFR (Non-African Amer.)  Date Value Ref Range Status  12/07/2012 >60  Final    Comment:    eGFR values <13m/min/1.73 m2 may be an indication of chronic kidney disease (CKD). Calculated eGFR is useful in patients with stable renal function. The eGFR calculation will not be reliable in acutely ill patients when serum creatinine is changing rapidly. It is not useful in  patients on dialysis. The eGFR calculation may not be applicable to patients at the low and high extremes of body sizes, pregnant women, and vegetarians.    GFR calc non Af Amer  Date Value Ref Range Status  10/10/2017 >60 >60 mL/min Final         Passed - Cr in normal range and within 360 days    Creat  Date Value Ref Range Status  01/04/2019 0.74 0.50 - 1.05 mg/dL Final    Comment:    For  patients >450years of age, the reference limit for Creatinine is approximately 13% higher for people identified as African-American. .Renella Cunas- Valid encounter within last 6 months    Recent Outpatient Visits          2 months ago Type 2 diabetes mellitus with complication, with long-term current use of insulin ([facility]   [place]   7 months ago Type 2 diabetes mellitus with complication, with long-term current use of insulin ([facility]   8 months ago Excessive bleeding in premenopausal period   [place] NP   3 years ago Dysfunctional uterine bleeding   [facility]      Future Appointments            In 4 weeks [place], [facility]

## 2019-08-12 ENCOUNTER — Other Ambulatory Visit: Payer: Self-pay | Admitting: Otolaryngology

## 2019-08-12 DIAGNOSIS — K1120 Sialoadenitis, unspecified: Secondary | ICD-10-CM

## 2019-08-18 ENCOUNTER — Telehealth: Payer: Self-pay

## 2019-08-18 NOTE — Telephone Encounter (Signed)
Pt called back to report that she needs her Trulicity refilled

## 2019-08-18 NOTE — Telephone Encounter (Signed)
-----   Message from [name], FNP sent at 08/18/2019  2:05 PM EDT ----- Regarding: FW: [facility]  ----- Message ----- From: Evlyn Courier Sent: 08/18/2019   1:30 PM EDT To: [name], FNP Subject: [facility] Error                         Good Evening,   Jill Side called the office on May tenth about a prescription refill.  [facility] told her she needed it to get approved by her provider before they could release it to her.    I told her I could send you a message just to relay her concerns. She does have some difficulty working her myChart, so she wasn't able to contact you that way.  I also tried to add the "[facility] Clinical pool" with this message but it would not come up for me.  Thank you,  [place] [phone]

## 2019-08-18 NOTE — Telephone Encounter (Signed)
I attempted to contact the patient to find out what medication she needs to be refill. No answer, lmom to return my call.

## 2019-08-19 ENCOUNTER — Other Ambulatory Visit: Payer: Self-pay | Admitting: Family Medicine

## 2019-08-19 DIAGNOSIS — E118 Type 2 diabetes mellitus with unspecified complications: Secondary | ICD-10-CM

## 2019-08-19 NOTE — Telephone Encounter (Signed)
The pt was notified that the prescription was available for pick up at her pharmacy.

## 2019-08-19 NOTE — Telephone Encounter (Signed)
Requested medication (s) are due for refill today:yes  Requested medication (s) are on the active medication list:yes  Last refill: 08/19/19 today  Future visit scheduled: yes  Notes to clinic:Pharmacy requesting alternative Insurance limits one pen a week increase to higher dose per pen  Sig needs addressed    Requested Prescriptions  Pending Prescriptions Disp Refills   TRULICITY 3 0000000 SOPN [Pharmacy Med Name: TRULICITY 3 [id number] ML PEN]  0      Endocrinology:  Diabetes - GLP-1 Receptor Agonists Failed - 08/19/2019 12:41 PM      Failed - HBA1C is between 0 and 7.9 and within 180 days    Hemoglobin A1C  Date Value Ref Range Status  06/02/2019 8.3 (A) 4.0 - 5.6 % Final  04/05/2015 11.6  Final   Hgb A1c MFr Bld  Date Value Ref Range Status  06/25/2017 8.4 (H) 4.8 - 5.6 % Final    Comment:             Prediabetes: 5.7 - 6.4          Diabetes: >6.4          Glycemic control for adults with diabetes: <7.0           Passed - Valid encounter within last 6 months    Recent Outpatient Visits           2 months ago Type 2 diabetes mellitus with complication, with long-term current use of insulin ([place])   [place], DO   7 months ago Type 2 diabetes mellitus with complication, with long-term current use of insulin [facility])   [facility] Merrilyn Puma, Jerrel Ivory, NP   8 months ago Excessive bleeding in premenopausal period   [place], NP   3 years ago Dysfunctional uterine bleeding   [facility] Glean Hess, MD       Future Appointments             In 2 weeks  [facility], [place]   In 2 weeks [place], [name], [facility], [facility]

## 2019-08-23 ENCOUNTER — Ambulatory Visit: Admission: RE | Admit: 2019-08-23 | Payer: Medicare HMO | Source: Ambulatory Visit

## 2019-08-25 ENCOUNTER — Other Ambulatory Visit: Payer: Self-pay | Admitting: Family Medicine

## 2019-08-25 DIAGNOSIS — G43709 Chronic migraine without aura, not intractable, without status migrainosus: Secondary | ICD-10-CM

## 2019-08-25 NOTE — Telephone Encounter (Signed)
Requested Prescriptions  Pending Prescriptions Disp Refills  . SUMAtriptan (IMITREX) 50 MG tablet [Pharmacy Med Name: SUMATRIPTAN SUCC 50 MG TABLET] 9 tablet 2    Sig: TAKE 1 TAB BY MOUTH ONCE AS NEEDED FOR UP TO 1 DOSE FOR MIGRAINE. MAY REPEAT DOSE IN 2HRS IF HEADACHE PERSISTS FOR MAX DOSE 24 HOURS     Neurology:  Migraine Therapy - Triptan Passed - 08/25/2019  1:19 AM      Passed - Last BP in normal range    BP Readings from Last 1 Encounters:  06/02/19 122/67         Passed - Valid encounter within last 12 months    Recent Outpatient Visits          2 months ago Type 2 diabetes mellitus with complication, with long-term current use of insulin ([place])   [place], DO   7 months ago Type 2 diabetes mellitus with complication, with long-term current use of insulin [facility])   [facility] [name], NP   8 months ago Excessive bleeding in premenopausal period   [facility] Merrilyn Puma, Jerrel Ivory, NP   3 years ago Dysfunctional uterine bleeding   [facility] Glean Hess, MD      Future Appointments            In 1 week  [facility], [place]   In 1 week [place], [name], [facility], [facility]

## 2019-09-02 ENCOUNTER — Other Ambulatory Visit: Payer: Self-pay | Admitting: Nurse Practitioner

## 2019-09-02 DIAGNOSIS — E1140 Type 2 diabetes mellitus with diabetic neuropathy, unspecified: Secondary | ICD-10-CM

## 2019-09-07 ENCOUNTER — Ambulatory Visit (INDEPENDENT_AMBULATORY_CARE_PROVIDER_SITE_OTHER): Payer: Medicare HMO

## 2019-09-07 ENCOUNTER — Ambulatory Visit (INDEPENDENT_AMBULATORY_CARE_PROVIDER_SITE_OTHER): Payer: Medicare HMO | Admitting: Family Medicine

## 2019-09-07 ENCOUNTER — Encounter: Payer: Self-pay | Admitting: Family Medicine

## 2019-09-07 ENCOUNTER — Other Ambulatory Visit: Payer: Self-pay

## 2019-09-07 VITALS — BP 93/80 | HR 70 | Temp 97.7°F | Resp 12 | Ht 61.0 in | Wt 145.0 lb

## 2019-09-07 VITALS — BP 137/71 | HR 70 | Temp 97.1°F | Resp 12 | Ht 61.0 in | Wt 145.0 lb

## 2019-09-07 DIAGNOSIS — Z1231 Encounter for screening mammogram for malignant neoplasm of breast: Secondary | ICD-10-CM | POA: Diagnosis not present

## 2019-09-07 DIAGNOSIS — R0600 Dyspnea, unspecified: Secondary | ICD-10-CM | POA: Insufficient documentation

## 2019-09-07 DIAGNOSIS — Z1159 Encounter for screening for other viral diseases: Secondary | ICD-10-CM

## 2019-09-07 DIAGNOSIS — E118 Type 2 diabetes mellitus with unspecified complications: Secondary | ICD-10-CM

## 2019-09-07 DIAGNOSIS — Z0000 Encounter for general adult medical examination without abnormal findings: Secondary | ICD-10-CM | POA: Diagnosis not present

## 2019-09-07 DIAGNOSIS — R635 Abnormal weight gain: Secondary | ICD-10-CM | POA: Diagnosis not present

## 2019-09-07 DIAGNOSIS — Z794 Long term (current) use of insulin: Secondary | ICD-10-CM

## 2019-09-07 DIAGNOSIS — R8290 Unspecified abnormal findings in urine: Secondary | ICD-10-CM

## 2019-09-07 DIAGNOSIS — E1140 Type 2 diabetes mellitus with diabetic neuropathy, unspecified: Secondary | ICD-10-CM

## 2019-09-07 DIAGNOSIS — R0609 Other forms of dyspnea: Secondary | ICD-10-CM

## 2019-09-07 DIAGNOSIS — L989 Disorder of the skin and subcutaneous tissue, unspecified: Secondary | ICD-10-CM | POA: Diagnosis not present

## 2019-09-07 DIAGNOSIS — Z79899 Other long term (current) drug therapy: Secondary | ICD-10-CM | POA: Diagnosis not present

## 2019-09-07 DIAGNOSIS — Z114 Encounter for screening for human immunodeficiency virus [HIV]: Secondary | ICD-10-CM

## 2019-09-07 DIAGNOSIS — E119 Type 2 diabetes mellitus without complications: Secondary | ICD-10-CM

## 2019-09-07 DIAGNOSIS — K219 Gastro-esophageal reflux disease without esophagitis: Secondary | ICD-10-CM

## 2019-09-07 LAB — POCT URINALYSIS DIPSTICK
Bilirubin, UA: NEGATIVE
Glucose, UA: POSITIVE — AB
Ketones, UA: NEGATIVE
Leukocytes, UA: NEGATIVE
Nitrite, UA: POSITIVE
Protein, UA: NEGATIVE
Spec Grav, UA: 1.015 (ref 1.010–1.025)
Urobilinogen, UA: 0.2 E.U./dL
pH, UA: 5 (ref 5.0–8.0)

## 2019-09-07 LAB — POCT UA - MICROALBUMIN: Microalbumin Ur, POC: 20 mg/L

## 2019-09-07 LAB — POCT GLYCOSYLATED HEMOGLOBIN (HGB A1C)
HbA1c POC (<> result, manual entry): 7.7 % (ref 4.0–5.6)
HbA1c, POC (controlled diabetic range): 7.7 % — AB (ref 0.0–7.0)
Hemoglobin A1C: 7.7 % — AB (ref 4.0–5.6)

## 2019-09-07 LAB — GLUCOSE, POCT (MANUAL RESULT ENTRY): POC Glucose: 159 mg/dl — AB (ref 70–99)

## 2019-09-07 MED ORDER — PANTOPRAZOLE SODIUM 40 MG PO TBEC: 40.0000 mg | DELAYED_RELEASE_TABLET | Freq: Every day | ORAL | 1 refills | Status: DC

## 2019-09-07 NOTE — Assessment & Plan Note (Signed)
Skin lesion on left shoulder and right upper back.  Asymmetry, irregular borders, color change throughout both skin lesions, diameter slightly larger than a pencil eraser for left upper arm and less than a pencil eraser for right upper back.  Unknown changes as is first visit with patient.  Plan: 1. Referral to dermatology placed.

## 2019-09-07 NOTE — Assessment & Plan Note (Signed)
Reports dyspnea on exertion with needing breaks if walking longer distances.  Has intermittent episodes of dizziness with/without position changes that require her to sit down.  Episode in clinic today x 1, but has not eaten or taken her medications today.  Reports recreational illicit drug usage, last usage unknown.   Plan: 1. STAT labs drawn today for CBC and CMP 2. Labs drawn today for other orders 3. Order placed for echocardiogram 4. Based on echo results, will determine if needing to send patient to cardiology for evaluation 5. Will see back in clinic in 2 weeks for re-evaluation

## 2019-09-07 NOTE — Progress Notes (Signed)
Subjective:    Patient ID: Brandi Calderon, female    DOB: 03/21/1966, 54 y.o.   MRN: [id number]  Brandi Calderon is a 54 y.o. female presenting on 09/07/2019 for Diabetes (dizziness with sudden movement x 2 mths ) and Numbness (worsening peripheral neuropathy)  HPI  Diabetes Pt presents today for follow up Type 2 Diabetes Mellitus.  He/she (caps): She ACTION; IS/IS NOT: is not checking AM CBG at home. -Current diabetic medications include: metformin 1022m twice daily -ACTION; IS/IS NOT: is currently symptomatic -Actions; denies/reports/admits to: denies polydipsia, polyphagia, polyuria, headaches, diaphoresis, shakiness, chills, pain, or changes in vision -Clinical course has been stable -Reports no structured exercise routine -Diet is high in salt, high in fat, and high in carbohydrates  PREVENTION Eye exam current (within 1 year) Yes Foot exam current (within 1 year) No, will complete next visit Lipid/ASCVD risk reduction - on statin: YES/NO: Yes  Kidney Protection (On ACE/ARB)? YES/NO: No     Depression screen [facility]2/9 09/07/2019 09/07/2019 06/02/2019  Decreased Interest 2 0 3  Down, Depressed, Hopeless 2 0 2  PHQ - 2 Score 4 0 5  Altered sleeping 3 - 3  Tired, decreased energy 3 - 3  Change in appetite 3 - 3  Feeling bad or failure about yourself  2 - 2  Trouble concentrating 3 - 3  Moving slowly or fidgety/restless 3 - 3  Suicidal thoughts 0 - 0  PHQ-9 Score 21 - 22  Difficult doing work/chores Not difficult at all - Very difficult    Social History   Tobacco Use  . Smoking status: Current Every Day Smoker    Packs/day: 3.00    Years: 33.00    Pack years: 99.00    Types: Cigarettes  . Smokeless tobacco: Never Used  Substance Use Topics  . Alcohol use: No  . Drug use: Yes    Types: Marijuana    Comment: States she smokes MJ when she does not have any Tramadol.    Review of Systems  Constitutional: Negative.   HENT: Negative.   Eyes: Negative.     Respiratory: Positive for shortness of breath. Negative for apnea, cough, choking, chest tightness, wheezing and stridor.        With prolonged ambulation, no SOB in clinic today  Cardiovascular: Negative.   Gastrointestinal: Negative.   Endocrine: Negative.   Genitourinary: Negative.   Musculoskeletal: Negative.   Skin: Negative.        Skin lesion left upper arm and right upper back  Allergic/Immunologic: Negative.   Neurological: Positive for dizziness and numbness. Negative for tremors, seizures, syncope, facial asymmetry, speech difficulty, weakness, light-headedness and headaches.       Worsening numbness in left finger tips  Hematological: Negative.   Psychiatric/Behavioral: Negative.    Per HPI unless specifically indicated above     Objective:    BP 137/71   Pulse 70   Temp (!) 97.1 F (36.2 C) (Temporal)   Resp 12   Ht 5' 1"  (1.549 m)   Wt 145 lb (65.8 kg)   SpO2 100%   BMI 27.40 kg/m   Wt Readings from Last 3 Encounters:  09/07/19 145 lb (65.8 kg)  09/07/19 145 lb (65.8 kg)  06/02/19 147 lb 3.2 oz (66.8 kg)    Physical Exam Vitals reviewed.  Constitutional:      General: She is not in acute distress.    Appearance: Normal appearance. She is well-developed and overweight. She is not ill-appearing or  toxic-appearing.  HENT:     Head: Normocephalic.     Right Ear: Tympanic membrane, ear canal and external ear normal. There is no impacted cerumen.     Left Ear: Tympanic membrane, ear canal and external ear normal. There is no impacted cerumen.     Nose:     Comments: [name] is in place, covering mouth and nose  Eyes:     General: Lids are normal. Vision grossly intact.        Right eye: No discharge.        Left eye: No discharge.     Extraocular Movements: Extraocular movements intact.     Conjunctiva/sclera: Conjunctivae normal.     Pupils: Pupils are equal, round, and reactive to light.  Neck:     Thyroid: No thyroid mass, thyromegaly or thyroid  tenderness.  Cardiovascular:     Rate and Rhythm: Normal rate and regular rhythm.     Pulses: Normal pulses.          Dorsalis pedis pulses are 2+ on the right side and 2+ on the left side.     Heart sounds: Normal heart sounds. No murmur. No friction rub. No gallop.   Pulmonary:     Effort: Pulmonary effort is normal. No respiratory distress.     Breath sounds: Normal breath sounds.  Musculoskeletal:        General: Normal range of motion.     Cervical back: Normal range of motion and neck supple. No tenderness.     Right lower leg: No edema.     Left lower leg: No edema.     Comments: Negative phalens & negative tinels  Feet:     Right foot:     Skin integrity: Skin integrity normal.     Left foot:     Skin integrity: Skin integrity normal.  Lymphadenopathy:     Cervical: No cervical adenopathy.  Skin:    General: Skin is warm and dry.     Capillary Refill: Capillary refill takes less than 2 seconds.  Neurological:     General: No focal deficit present.     Mental Status: She is alert and oriented to person, place, and time.     Cranial Nerves: No cranial nerve deficit.     Sensory: No sensory deficit.     Motor: No weakness.     Coordination: Coordination normal.     Gait: Gait normal.  Psychiatric:        Attention and Perception: Attention and perception normal.        Mood and Affect: Mood and affect normal.        Speech: Speech normal.        Behavior: Behavior normal. Behavior is cooperative.        Thought Content: Thought content normal.        Cognition and Memory: Cognition and memory normal.        Judgment: Judgment normal.    Results for orders placed or performed in visit on 09/07/19  POCT glycosylated hemoglobin (Hb A1C)  Result Value Ref Range   Hemoglobin A1C 7.7 (A) 4.0 - 5.6 %   HbA1c POC (<> result, manual entry) 7.7 4.0 - 5.6 %   HbA1c, POC (prediabetic range)     HbA1c, POC (controlled diabetic range) 7.7 (A) 0.0 - 7.0 %  POCT Glucose (CBG)    Result Value Ref Range   POC Glucose 159 (A) 70 - 99 mg/dl  POCT Urinalysis Dipstick  Result Value Ref Range   Color, UA Yellow    Clarity, UA clear    Glucose, UA Positive (A) Negative   Bilirubin, UA negative    Ketones, UA negative    Spec Grav, UA 1.015 1.010 - 1.025   Blood, UA trace    pH, UA 5.0 5.0 - 8.0   Protein, UA Negative Negative   Urobilinogen, UA 0.2 0.2 or 1.0 E.U./dL   Nitrite, UA positive    Leukocytes, UA Negative Negative   Appearance     Odor    POCT UA - Microalbumin  Result Value Ref Range   Microalbumin Ur, POC 20 mg/L   Creatinine, POC     Albumin/Creatinine Ratio, Urine, POC        Assessment & Plan:   Problem List Items Addressed This Visit      Endocrine   Type 2 diabetes mellitus treated with insulin (HCC) (Chronic)    UncontrolledDM with A1c 7.7% improved from 8.3% on 06/02/2019 and goal H6O < 3.7%. -Complications - peripheral neuropathy and hyperglycemia.  Plan:  1. Continue current therapy: metformin 1056m twice daily 2. Encourage improved lifestyle: - low carb/low glycemic diet reinforced prior education - Increase physical activity to 30 minutes most days of the week.  Explained that increased physical activity increases body's use of sugar for energy. 3. Check fasting am CBG and log these.  Bring log to next visit for review 4. Continue Statin 5. Advised to schedule DM ophtho exam, send record. 6. Referral placed to Endocrinology for assistance with diabetic complications 7. Follow up in 2 weeks       Neuropathy due to type 2 diabetes mellitus ([place] - severe (Chronic)    Reports progressively worse peripheral neuropathy, worst in left hand.  Has been taking gabapentin 6043mTID with continued worsening of symptoms.  Has not yet met with endocrinology but is also interested in meeting with them to discuss freestyle libre.    Plan: 1. Referral to Endocrinology for assistance with her peripheral neuropathy with her diabetes and  possible initiation of freestyle libre      Relevant Orders   Ambulatory referral to Endocrinology     Musculoskeletal and Integument   Skin lesion    Skin lesion on left shoulder and right upper back.  Asymmetry, irregular borders, color change throughout both skin lesions, diameter slightly larger than a pencil eraser for left upper arm and less than a pencil eraser for right upper back.  Unknown changes as is first visit with patient.  Plan: 1. Referral to dermatology placed.      Relevant Orders   Ambulatory referral to Dermatology     Other   Dyspnea on exertion    Reports dyspnea on exertion with needing breaks if walking longer distances.  Has intermittent episodes of dizziness with/without position changes that require her to sit down.  Episode in clinic today x 1, but has not eaten or taken her medications today.  Reports recreational illicit drug usage, last usage unknown.   Plan: 1. STAT labs drawn today for CBC and CMP 2. Labs drawn today for other orders 3. Order placed for echocardiogram 4. Based on echo results, will determine if needing to send patient to cardiology for evaluation 5. Will see back in clinic in 2 weeks for re-evaluation      Relevant Orders   ECHOCARDIOGRAM COMPLETE    Other Visit Diagnoses    Type 2 diabetes mellitus with complication, with long-term current use of  insulin ([place])    -  Primary   Relevant Orders   POCT glycosylated hemoglobin (Hb A1C) (Completed)   POCT Glucose (CBG) (Completed)   POCT Urinalysis Dipstick (Completed)   POCT UA - Microalbumin (Completed)   CBC with Differential   COMPLETE METABOLIC PANEL WITH GFR   Ambulatory referral to Endocrinology   Long-term use of high-risk medication       Relevant Orders   Lipid Profile   Thyroid Panel With TSH   Weight gain       Relevant Orders   Thyroid Panel With TSH   Screening for HIV (human immunodeficiency virus)       Relevant Orders   HIV antibody (with reflex)    Encounter for hepatitis C screening test for low risk patient       Relevant Orders   Hepatitis C Antibody   Gastroesophageal reflux disease, unspecified whether esophagitis present       Relevant Medications   pantoprazole (PROTONIX) 40 MG tablet   Abnormal urinalysis       Relevant Orders   Urine Culture      Meds ordered this encounter  Medications  . pantoprazole (PROTONIX) 40 MG tablet    Sig: Take 1 tablet (40 mg total) by mouth daily.    Dispense:  90 tablet    Refill:  1      Follow up plan: Return in about 2 weeks (around 09/21/2019) for Dizziness F/U, PAP, and Foot Exam F/U.   Harlin Rain, [place] Family Nurse Practitioner [facility] 09/07/2019, 11:26 AM

## 2019-09-07 NOTE — Patient Instructions (Signed)
Have your labs completed today and we will contact you with the results.  I have put in a referral to Endocrinology for evaluation of your diabetes with worsening peripheral neuropathy and request to look into freestyle libre.  If you have not heard from their office or the referral coordinator in 1 week, please let us know and I will look into this for you.  A referral to dermatology has been placed today.  If you have not heard from the specialty office or our referral coordinator within 1 week, please let us know and we will follow up with the referral coordinator for an update.  An order for an echocardiogram was placed today for your concerns of dizziness, vasomotor flushing and shortness of breath with ambulation.  You should hear within the next week from someone to schedule this appointment.  Advice on Protecting Your Feet   1. Daily foot inspections - Look for any breaks in the skin or areas of irritation such as blisters or red areas. Report to primary doctor or foot nurse immediately if any problems occur.  - If you have vision problems and cannot see your feet well or it is hard for you to reach your feet, ask a family member to inspect your feet daily.   2. Daily foot hygiene  - Wash feet daily, but do not soak your feet in hot water. If you do have callus, you may use warm water epsom salt foot soak and use moisturizer after. - Dry well especially between toes; Pat dry do not rub.  - If your skin is dry use a lotion to moisturize but never between the toes.  - If your skin is wet from perspiration, use an antifungal foot powder daily.   3. Shoes and Socks  - Wear a clean pair of socks daily.  - Make sure your shoes fit well and that you are measured and properly fit each time you purchase shoes. Your shoe size may change.  - Powder your shoes with a small amount of an antifungal foot powder daily, as the shoe is the only article of clothing that is not laundered.  - Wear  appropriate shoes and socks for the weather. It is especially important to protect your feet from the cold; however, don't forget about sunscreen to the tops of your feet in the hot sun.  - Wear well fitting shoes rather than slippers or flip-flops when walking or standing for long period of time.  - When at home make sure you always have protective foot wear on your feet.     Eat at least 3 meals and 1-2 snacks per day (don't skip breakfast).  Aim for no more than 5 hours between eating. - Tip: If you go >5 hours without eating and become very hungry, your body will supply it's own resources temporarily and you can gain extra weight when you eat.  The 5 Minute Rule of Exercise - Promise yourself to at least do 5 minutes of exercise (make sure you time it), and if at the end of 5 minutes (this is the hardest part of the work-out), if you still feel like you want stop (or not motivated to continue) then allow yourself to stop. Otherwise, more often than not you will feel encouraged that you can continue for a little while longer or even more!  My 5 to Fitness!  5: fruits and vegetables per day (work on 9 per day if you are at 5) 4: exercise  4-5 times per week for at least 30 minutes (walking counts!) 3: meals per day (don't skip breakfast!), no more than 5 hours between meals 2: habits to quit -smoking -excess alcohol use (men >2 beer/day; women >1beer/day) 1: sweet per day (2 cookies, 1 small cup of ice cream, 12 oz soda)  These are general tips for healthy living. Try to start with 1 or 2 habit TODAY and make it a part of your life for several months. You set a goal today to work on:  Once you have 1 or 2 habits down for several months, try to begin working on your next healthy habit. With every single step you take, you will be leading a healthier lifestyle!   Diet Recommendations for Diabetes   Starchy (carb) foods include: Bread, rice, pasta, potatoes, corn, crackers, bagels,  muffins, all baked goods.   Protein foods include: Meat, fish, poultry, eggs, dairy foods, and beans such as pinto and kidney beans (beans also provide carbohydrate).   1. Eat at least 3 meals and 1-2 snacks per day. Never go more than 4-5 hours while awake without eating.   2. Limit starchy foods to TWO per meal and ONE per snack. ONE portion of a starchy  food is equal to the following:   - ONE slice of bread (or its equivalent, such as half of a hamburger bun).   - 1/2 cup of a "scoopable" starchy food such as potatoes or rice.   - 1 OUNCE (28 grams) of starchy snacks (crackers or pretzels, look on label).   - 15 grams of carbohydrate as shown on food label.   3. Both lunch and dinner should include a protein food, a carb food, and vegetables.   - Obtain twice as many veg's as protein or carbohydrate foods for both lunch and dinner.   - Try to keep frozen veg's on hand for a quick vegetable serving.     - Fresh or frozen veg's are best.   4. Breakfast should always include protein.    You can learn more information online about your diabetes at American Diabetes Association: http://www.diabetes.org/ - General self-care (diet, medications, blood sugar checks). - Diet recommendations - There are even recipes available for you to look at and try.  We will plan to see you back in 2 weeks for dizziness follow up, diabetic foot exam and PAP testing  You will receive a survey after today's visit either digitally by e-mail or paper by [place] mail. Your experiences and feedback matter to [place].  Please respond so we know how we are doing as we provide care for you.  Call us with any questions/concerns/needs.  It is my goal to be available to you for your health concerns.  Thanks for choosing me to be a partner in your healthcare needs!  Harlin Rain, FNP-C Family Nurse Practitioner [facility] Phone: [phone]

## 2019-09-07 NOTE — Assessment & Plan Note (Signed)
UncontrolledDM with A1c 7.7% improved from 8.3% on 06/02/2019 and goal Y3G < 9.4%. -Complications - peripheral neuropathy and hyperglycemia.  Plan:  1. Continue current therapy: metformin 1000mg  twice daily 2. Encourage improved lifestyle: - low carb/low glycemic diet reinforced prior education - Increase physical activity to 30 minutes most days of the week.  Explained that increased physical activity increases body's use of sugar for energy. 3. Check fasting am CBG and log these.  Bring log to next visit for review 4. Continue Statin 5. Advised to schedule DM ophtho exam, send record. 6. Referral placed to Endocrinology for assistance with diabetic complications 7. Follow up in 2 weeks

## 2019-09-07 NOTE — Patient Instructions (Addendum)
Brandi Calderon , Thank you for taking time to come for your Medicare Wellness Visit. I appreciate your ongoing commitment to your health goals. Please review the following plan we discussed and let me know if I can assist you in the future.   Screening recommendations/referrals: Colonoscopy: Up to date Mammogram: Ordered today Please call [phone] to schedule your mammogram.   Bone Density: N/A Recommended yearly ophthalmology/optometry visit for glaucoma screening and checkup Recommended yearly dental visit for hygiene and checkup  Vaccinations: Influenza vaccine: Due 12/2019 Pneumococcal vaccine:  Tdap vaccine: Due Shingles vaccine: Never Covid-19: Declined  Advanced directives: Info given  Conditions/risks identified: See Problem List Refered to Chronic Care Management for management of Diabetes & medications.     Next appointment: Follow up in one year for your annual wellness visit.   Preventive Care 40-64 Years, Female Preventive care refers to lifestyle choices and visits with your health care provider that can promote health and wellness. What does preventive care include?  A yearly physical exam. This is also called an annual well check.  Dental exams once or twice a year.  Routine eye exams. Ask your health care provider how often you should have your eyes checked.  Personal lifestyle choices, including:  Daily care of your teeth and gums.  Regular physical activity.  Eating a healthy diet.  Avoiding tobacco and drug use.  Limiting alcohol use.  Practicing safe sex.  Taking low-dose aspirin daily starting at age 30.  Taking vitamin and mineral supplements as recommended by your health care provider. What happens during an annual well check? The services and screenings done by your health care provider during your annual well check will depend on your age, overall health, lifestyle risk factors, and family history of disease. Counseling  Your health  care provider may ask you questions about your:  Alcohol use.  Tobacco use.  Drug use.  Emotional well-being.  Home and relationship well-being.  Sexual activity.  Eating habits.  Work and work Statistician.  Method of birth control.  Menstrual cycle.  Pregnancy history. Screening  You may have the following tests or measurements:  Height, weight, and BMI.  Blood pressure.  Lipid and cholesterol levels. These may be checked every 5 years, or more frequently if you are over 58 years old.  Skin check.  Lung cancer screening. You may have this screening every year starting at age 70 if you have a 30-pack-year history of smoking and currently smoke or have quit within the past 15 years.  Fecal occult blood test (FOBT) of the stool. You may have this test every year starting at age 21.  Flexible sigmoidoscopy or colonoscopy. You may have a sigmoidoscopy every 5 years or a colonoscopy every 10 years starting at age 37.  Hepatitis C blood test.  Hepatitis B blood test.  Sexually transmitted disease (STD) testing.  Diabetes screening. This is done by checking your blood sugar (glucose) after you have not eaten for a while (fasting). You may have this done every 1-3 years.  Mammogram. This may be done every 1-2 years. Talk to your health care provider about when you should start having regular mammograms. This may depend on whether you have a family history of breast cancer.  BRCA-related cancer screening. This may be done if you have a family history of breast, ovarian, tubal, or peritoneal cancers.  Pelvic exam and Pap test. This may be done every 3 years starting at age 37. Starting at age 103, this may be  done every 5 years if you have a Pap test in combination with an HPV test.  Bone density scan. This is done to screen for osteoporosis. You may have this scan if you are at high risk for osteoporosis. Discuss your test results, treatment options, and if necessary, the  need for more tests with your health care provider. Vaccines  Your health care provider may recommend certain vaccines, such as:  Influenza vaccine. This is recommended every year.  Tetanus, diphtheria, and acellular pertussis (Tdap, Td) vaccine. You may need a Td booster every 10 years.  Zoster vaccine. You may need this after age 43.  Pneumococcal 13-valent conjugate (PCV13) vaccine. You may need this if you have certain conditions and were not previously vaccinated.  Pneumococcal polysaccharide (PPSV23) vaccine. You may need one or two doses if you smoke cigarettes or if you have certain conditions. Talk to your health care provider about which screenings and vaccines you need and how often you need them. This information is not intended to replace advice given to you by your health care provider. Make sure you discuss any questions you have with your health care provider. Document Released: 04/14/2015 Document Revised: 12/06/2015 Document Reviewed: 01/17/2015 Elsevier Interactive Patient Education  2017 [place] Prevention in the Home Falls can cause injuries. They can happen to people of all ages. There are many things you can do to make your home safe and to help prevent falls. What can I do on the outside of my home?  Regularly fix the edges of walkways and driveways and fix any cracks.  Remove anything that might make you trip as you walk through a door, such as a raised step or threshold.  Trim any bushes or trees on the path to your home.  Use bright outdoor lighting.  Clear any walking paths of anything that might make someone trip, such as rocks or tools.  Regularly check to see if handrails are loose or broken. Make sure that both sides of any steps have handrails.  Any raised decks and porches should have guardrails on the edges.  Have any leaves, snow, or ice cleared regularly.  Use sand or salt on walking paths during winter.  Clean up any  spills in your garage right away. This includes oil or grease spills. What can I do in the bathroom?  Use night lights.  Install grab bars by the toilet and in the tub and shower. Do not use towel bars as grab bars.  Use non-skid mats or decals in the tub or shower.  If you need to sit down in the shower, use a plastic, non-slip stool.  Keep the floor dry. Clean up any water that spills on the floor as soon as it happens.  Remove soap buildup in the tub or shower regularly.  Attach bath mats securely with double-sided non-slip rug tape.  Do not have throw rugs and other things on the floor that can make you trip. What can I do in the bedroom?  Use night lights.  Make sure that you have a light by your bed that is easy to reach.  Do not use any sheets or blankets that are too big for your bed. They should not hang down onto the floor.  Have a firm chair that has side arms. You can use this for support while you get dressed.  Do not have throw rugs and other things on the floor that can make you trip. What  can I do in the kitchen?  Clean up any spills right away.  Avoid walking on wet floors.  Keep items that you use a lot in easy-to-reach places.  If you need to reach something above you, use a strong step stool that has a grab bar.  Keep electrical cords out of the way.  Do not use floor polish or wax that makes floors slippery. If you must use wax, use non-skid floor wax.  Do not have throw rugs and other things on the floor that can make you trip. What can I do with my stairs?  Do not leave any items on the stairs.  Make sure that there are handrails on both sides of the stairs and use them. Fix handrails that are broken or loose. Make sure that handrails are as long as the stairways.  Check any carpeting to make sure that it is firmly attached to the stairs. Fix any carpet that is loose or worn.  Avoid having throw rugs at the top or bottom of the stairs. If you  do have throw rugs, attach them to the floor with carpet tape.  Make sure that you have a light switch at the top of the stairs and the bottom of the stairs. If you do not have them, ask someone to add them for you. What else can I do to help prevent falls?  Wear shoes that:  Do not have high heels.  Have rubber bottoms.  Are comfortable and fit you well.  Are closed at the toe. Do not wear sandals.  If you use a stepladder:  Make sure that it is fully opened. Do not climb a closed stepladder.  Make sure that both sides of the stepladder are locked into place.  Ask someone to hold it for you, if possible.  Clearly mark and make sure that you can see:  Any grab bars or handrails.  First and last steps.  Where the edge of each step is.  Use tools that help you move around (mobility aids) if they are needed. These include:  Canes.  Walkers.  Scooters.  Crutches.  Turn on the lights when you go into a dark area. Replace any light bulbs as soon as they burn out.  Set up your furniture so you have a clear path. Avoid moving your furniture around.  If any of your floors are uneven, fix them.  If there are any pets around you, be aware of where they are.  Review your medicines with your doctor. Some medicines can make you feel dizzy. This can increase your chance of falling. Ask your doctor what other things that you can do to help prevent falls. This information is not intended to replace advice given to you by your health care provider. Make sure you discuss any questions you have with your health care provider. Document Released: 01/12/2009 Document Revised: 08/24/2015 Document Reviewed: 04/22/2014 Elsevier Interactive Patient Education  2017 [employer].

## 2019-09-07 NOTE — Assessment & Plan Note (Signed)
Reports progressively worse peripheral neuropathy, worst in left hand.  Has been taking gabapentin 674m TID with continued worsening of symptoms.  Has not yet met with endocrinology but is also interested in meeting with them to discuss freestyle libre.    Plan: 1. Referral to Endocrinology for assistance with her peripheral neuropathy with her diabetes and possible initiation of freestyle libre

## 2019-09-07 NOTE — Progress Notes (Signed)
Subjective:   Brandi Calderon is a 54 y.o. female who presents for an Initial Medicare Annual Wellness Visit.  Review of Systems     Cardiac Risk Factors include: diabetes mellitus;dyslipidemia;smoking/ tobacco exposure;sedentary lifestyle     Objective:    Today's Vitals   09/07/19 0856 09/07/19 0925  BP: 137/71 93/80  Pulse: 70   Resp: 12   Temp: 97.7 F (36.5 C)   SpO2: 100%   Weight: 145 lb (65.8 kg)   Height: 5\' 1"  (1.549 m)    Body mass index is 27.4 kg/m.  Advanced Directives 09/07/2019 06/25/2018 05/20/2018 10/01/2017 03/04/2017 03/11/2016 12/23/2015  Does Patient Have a Medical Advance Directive? No No No No No No No  Would patient like information on creating a medical advance directive? Yes (ED - Information included in AVS) No - Patient declined - No - Patient declined - No - Patient declined -    Current Medications (verified) Outpatient Encounter Medications as of 09/07/2019  Medication Sig  . Albuterol Sulfate 108 (90 Base) MCG/ACT AEPB Inhale into the lungs. Use 2 puffs 4 times a day prn  . Aspirin-Salicylamide-Caffeine (BC HEADACHE POWDER PO) Take 4 packets by mouth daily as needed (pain).  [name] Kitchen atorvastatin (LIPITOR) 20 MG tablet TAKE 1 TABLET (20 MG TOTAL) BY MOUTH DAILY AT 6 PM.  . buPROPion (WELLBUTRIN XL) 150 MG 24 hr tablet   . cetirizine (ZYRTEC) 10 MG tablet Take by mouth.  . clonazePAM (KLONOPIN) 0.5 MG tablet Take 0.5 mg by mouth 2 (two) times daily as needed for anxiety (Sometimes takes 3 tablets a day).   . cloNIDine (CATAPRES) 0.1 MG tablet Take by mouth.  . Dulaglutide (TRULICITY) 3 PJ/0.9TO SOPN Inject 3 mg into the skin once a week.  . Fluticasone Propionate (FLONASE NA) Place into the nose as needed. Use 2 spray in each nostril prn  . Fluticasone-Umeclidin-Vilant (TRELEGY ELLIPTA) 100-62.5-25 MCG/INH AEPB Inhale 1 puff into the lungs daily.  . furosemide (LASIX) 40 MG tablet Take by mouth.  . gabapentin (NEURONTIN) 600 MG tablet TAKE 1 TABLET BY  MOUTH THREE TIMES A DAY  . medroxyPROGESTERone (PROVERA) 10 MG tablet Take 1 tablet (10 mg total) by mouth daily.  . metFORMIN (GLUCOPHAGE) 1000 MG tablet TAKE ONE TABLET BY MOUTH 2 TIMES A DAY WITH A MEAL  . omega-3 acid ethyl esters (LOVAZA) 1 g capsule TAKE 1 CAPSULE (1 G TOTAL) BY MOUTH 2 (TWO) TIMES DAILY.  Brandi Calderon   . ONETOUCH VERIO test strip   . SUMAtriptan (IMITREX) 50 MG tablet TAKE 1 TAB BY MOUTH ONCE AS NEEDED FOR UP TO 1 DOSE FOR MIGRAINE. MAY REPEAT DOSE IN 2HRS IF HEADACHE PERSISTS FOR MAX DOSE 24 HOURS  . SYMBICORT 160-4.5 MCG/ACT inhaler SMARTSIG:2 Puff(s) By Mouth Twice Daily  . tiZANidine (ZANAFLEX) 4 MG tablet Take 1 tablet (4 mg total) by mouth 2 (two) times daily as needed for muscle spasms.  . traMADol (ULTRAM) 50 MG tablet Take by mouth every 6 (six) hours as needed.  . citalopram (CELEXA) 20 MG tablet Take 40 mg by mouth 2 (two) times daily.   . diclofenac (VOLTAREN) 75 MG EC tablet Take by mouth.  . DULoxetine (CYMBALTA) 30 MG capsule 1 capsule daily.  . hydrOXYzine (VISTARIL) 25 MG capsule Take 25-50 mg by mouth at bedtime as needed.   No facility-administered encounter medications on file as of 09/07/2019.    Allergies (verified) Penicillins, Victoza [liraglutide], and Vicodin [hydrocodone-acetaminophen]  History: Past Medical History:  Diagnosis Date  . Anxiety   . Arthritis   . Asthma   . Back pain   . COPD (chronic obstructive pulmonary disease) ([place])   . Depression   . Diabetes mellitus   . Diabetes mellitus without complication ([place])   . Headache   . Hyperlipidemia   . Hypertension   . Tendonitis    Past Surgical History:  Procedure Laterality Date  . CESAREAN SECTION    . CESAREAN SECTION     3 times  . cyst on neck     as a baby/right side   Family History  Problem Relation Age of Onset  . Kidney disease Father   . Diabetes Father   . Dementia Father   . Diabetes Sister   . Diabetes Brother   . Diabetes  Mother   . Diabetes Brother    Social History   Socioeconomic History  . Marital status: Divorced    Spouse name: Not on file  . Number of children: Not on file  . Years of education: Not on file  . Highest education level: Not on file  Occupational History  . Not on file  Tobacco Use  . Smoking status: Current Every Day Smoker    Packs/day: 3.00    Years: 33.00    Pack years: 99.00    Types: Cigarettes  . Smokeless tobacco: Never Used  Substance and Sexual Activity  . Alcohol use: No  . Drug use: Yes    Types: Marijuana    Comment: States she smokes MJ when she does not have any Tramadol.  . Sexual activity: Not on file  Other Topics Concern  . Not on file  Social History Narrative   ** Merged History Encounter **       In the process of getting SSD for depression, DM and neuropathy   Social Determinants of Health   Financial Resource Strain:   . Difficulty of Paying Living Expenses:   Food Insecurity:   . Worried About Charity fundraiser in the Last Year:   . Arboriculturist in the Last Year:   Transportation Needs:   . Film/video editor (Medical):   [name] Kitchen Lack of Transportation (Non-Medical):   Physical Activity:   . Days of Exercise per Week:   . Minutes of Exercise per Session:   Stress:   . Feeling of Stress :   Social Connections:   . Frequency of Communication with Friends and Family:   . Frequency of Social Gatherings with Friends and Family:   . Attends Religious Services:   . Active Member of Clubs or Organizations:   . Attends Archivist Meetings:   [name] Kitchen Marital Status:     Tobacco Counseling Ready to quit: No Counseling given: Not Answered   Clinical Intake:  Pre-visit preparation completed: Yes  Pain : No/denies pain     Nutritional Status: BMI 25 -29 Overweight Nutritional Risks: None Diabetes: Yes CBG done?: Yes Did pt. bring in CBG monitor from home?: No  How often do you need to have someone help you when you read  instructions, pamphlets, or other written materials from your doctor or pharmacy?: 1 - Never  Nutrition Risk Assessment:  Has the patient had any N/V/D within the last 2 months?  No  Does the patient have any non-healing wounds?  No  Has the patient had any unintentional weight loss or weight gain?  No   Diabetes:  Is  the patient diabetic?  Yes  If diabetic, was a CBG obtained today?  Yes  Did the patient bring in their glucometer from home?  No  How often do you monitor your CBG's? rarely.   Financial Strains and Diabetes Management:  Are you having any financial strains with the device, your supplies or your medication? No .  Does the patient want to be seen by Chronic Care Management for management of their diabetes?  Yes  Would the patient like to be referred to a Nutritionist or for Diabetic Management?  No   Diabetic Exams:  Diabetic Eye Exam: Completed 02/17/2019.  Diabetic Foot Exam: Pt has been advised about the importance in completing this exam. Pt is seeing PCP today.  Interpreter Needed?: No  Information entered by :: Caroleen Hamman LPN   Activities of Daily Living In your present state of health, do you have any difficulty performing the following activities: 09/07/2019 06/02/2019  Hearing? Y N  Vision? N N  Difficulty concentrating or making decisions? Y N  Comment frequently forgets things -  Walking or climbing stairs? N N  Dressing or bathing? N N  Doing errands, shopping? N N  Preparing Food and eating ? N -  Using the Toilet? N -  In the past six months, have you accidently leaked urine? N -  Do you have problems with loss of bowel control? N -  Managing your Medications? N -  Managing your Finances? N -  Housekeeping or managing your Housekeeping? N -  Some recent data might be hidden     Immunizations and Health Maintenance Immunization History  Administered Date(s) Administered  . Influenza Inj Mdck Quad Pf 02/07/2016  . Influenza,inj,Quad  PF,6+ Mos 01/04/2019  . Influenza-Unspecified 02/18/2017   Health Maintenance Due  Topic Date Due  . Hepatitis C Screening  Never done  . PNEUMOCOCCAL POLYSACCHARIDE VACCINE AGE 26-64 HIGH RISK  Never done  . FOOT EXAM  Never done  . COVID-19 Vaccine (1) Never done  . HIV Screening  Never done  . TETANUS/TDAP  Never done  . MAMMOGRAM  Never done  . PAP SMEAR-Modifier  08/01/2018    Patient Care Team: Malfi, [name], FNP as PCP - General (Family Medicine) Dr. Leonides Schanz (Psychiatry) Dr. Kenton Kingfisher (Gynecology) Tawni Millers, MD (Internal Medicine)  Indicate any recent Medical Services you may have received from other than [facility] providers in the past year (date may be approximate).     Assessment:   This is a routine wellness examination for Jasslyn.  Hearing/Vision screen  Hearing Screening   125Hz  250Hz  500Hz  1000Hz  2000Hz  3000Hz  4000Hz  6000Hz  8000Hz   Right ear:           Left ear:           Comments: Difficulty hearing  Vision Screening Comments: Glasses Last eye exam-2020  Dietary issues and exercise activities discussed: Current Exercise Habits: The patient does not participate in regular exercise at present, Exercise limited by: None identified  Goals Addressed            This Visit's Progress   . Quit Smoking        Depression Screen PHQ 2/9 Scores 09/07/2019 06/02/2019 01/04/2019 12/03/2018 06/25/2018 03/11/2016  PHQ - 2 Score 0 5 0 2 1 6   PHQ- 9 Score - 22 5 10  - 15    Fall Risk Fall Risk  09/07/2019 06/25/2018 03/11/2016  Falls in the past year? 1 0 No  Number falls in past yr:  1 - -  Injury with Fall? 0 - -  Risk for fall due to : History of fall(s) - -  Follow up Education provided;Falls evaluation completed;Falls prevention discussed - -    FALL RISK PREVENTION PERTAINING TO THE HOME:  Any stairs in or around the home? Yes  If so, are there any without handrails? Yes   Home free of loose throw rugs in walkways, pet beds, electrical cords, etc? Yes  Adequate  lighting in your home to reduce risk of falls? Yes   ASSISTIVE DEVICES UTILIZED TO PREVENT FALLS:  Life alert? No  Use of a cane, walker or w/c? No  Grab bars in the bathroom? No  Shower chair or bench in shower? No  Elevated toilet seat or a handicapped toilet? No    TIMED UP AND GO:  Was the test performed? Yes .  Length of time to ambulate 10 feet: 11 sec.   GAIT:  Appearance of gait:   Gait slow and steady without the use of an assistive device.  Education: Fall risk prevention has been discussed.  Intervention(s) required? Yes   DME/home health order needed?  No    Cognitive Function: Patient had difficulty with Months of the year in reverse & remembering address. She states she is forgetting things frequently.     6CIT Screen 09/07/2019  What Year? 0 points  What month? 0 points  What time? 0 points  Count back from 20 0 points  Months in reverse 4 points  Repeat phrase 4 points  Total Score 8    Screening Tests Health Maintenance  Topic Date Due  . Hepatitis C Screening  Never done  . PNEUMOCOCCAL POLYSACCHARIDE VACCINE AGE 72-64 HIGH RISK  Never done  . FOOT EXAM  Never done  . COVID-19 Vaccine (1) Never done  . HIV Screening  Never done  . TETANUS/TDAP  Never done  . MAMMOGRAM  Never done  . PAP SMEAR-Modifier  08/01/2018  . INFLUENZA VACCINE  10/31/2019  . HEMOGLOBIN A1C  12/03/2019  . OPHTHALMOLOGY EXAM  02/18/2020  . COLONOSCOPY  05/06/2021    Qualifies for Shingles Vaccine? Yes  . Due for Shingrix. Education has been provided regarding the importance of this vaccine. Pt has been advised to call insurance company to determine out of pocket expense. Advised may also receive vaccine at local pharmacy or Health Dept. Verbalized acceptance and understanding.  Tdap: Although this vaccine is not a covered service during a Wellness Exam, does the patient still wish to receive this vaccine today?  No .  Education has been provided regarding the importance of  this vaccine. Advised may receive this vaccine at local pharmacy or Health Dept. Aware to provide a copy of the vaccination record if obtained from local pharmacy or Health Dept. Verbalized acceptance and understanding.  Flu Vaccine: Due 12/2019  Pneumococcal Vaccine: Due for Pneumococcal vaccine. Does the patient want to receive this vaccine today?  No . Education has been provided regarding the importance of this vaccine but still declined. Advised may receive this vaccine at local pharmacy or Health Dept. Aware to provide a copy of the vaccination record if obtained from local pharmacy or Health Dept. Verbalized acceptance and understanding.   COVID-19 vaccine: Not completed. Declined  Cancer Screenings:  Colorectal Screening: Completed 05/06/2018. Repeat every 10 years.  Mammogram:  Ordered today. Pt provided with contact info and advised to call to schedule appt. Pt aware the office will call re: appt.  Bone Density:  N/A  Lung Cancer Screening: (Low Dose CT Chest recommended if Age 53-80 years, 30 pack-year currently smoking OR have quit w/in 15years.) does qualify.    Additional Screening:   Dental Screening: Recommended annual dental exams for proper oral hygiene  Community Resource Referral:  CRR required this visit?  Yes       Plan:  I have personally reviewed and addressed the Medicare Annual Wellness questionnaire and have noted the following in the patient's chart:  A. Medical and social history B. Use of alcohol, tobacco or illicit drugs  C. Current medications and supplements D. Functional ability and status E.  Nutritional status F.  Physical activity G. Advance directives H. List of other physicians I.  Hospitalizations, surgeries, and ER visits in previous 12 months J.  Pinch such as hearing and vision if needed, cognitive and depression L. Referrals and appointments   In addition, I have reviewed and discussed with patient certain  preventive protocols, quality metrics, and best practice recommendations. A written personalized care plan for preventive services as well as general preventive health recommendations were provided to patient.   Signed,    EISLEY BARBER, LPN   08/30/4828  Nurse Health Advisor    Nurse Notes: Mammogram ordered; Referral to CCM placed. Patient needs Diabetic education, possible grief counseling & medication education/management. She would also like to discuss possibly getting a continuous glucose monitor because she admits she rarely checks her blood sugar at home.

## 2019-09-08 LAB — CBC WITH DIFFERENTIAL/PLATELET
Absolute Monocytes: 530 cells/uL (ref 200–950)
Basophils Absolute: 53 cells/uL (ref 0–200)
Basophils Relative: 0.5 %
Eosinophils Absolute: 127 cells/uL (ref 15–500)
Eosinophils Relative: 1.2 %
HCT: 43.1 % (ref 35.0–45.0)
Hemoglobin: 14 g/dL (ref 11.7–15.5)
Lymphs Abs: 2523 cells/uL (ref 850–3900)
MCH: 29.7 pg (ref 27.0–33.0)
MCHC: 32.5 g/dL (ref 32.0–36.0)
MCV: 91.3 fL (ref 80.0–100.0)
MPV: 9.2 fL (ref 7.5–12.5)
Monocytes Relative: 5 %
Neutro Abs: 7367 cells/uL (ref 1500–7800)
Neutrophils Relative %: 69.5 %
Platelets: 290 10*3/uL (ref 140–400)
RBC: 4.72 10*6/uL (ref 3.80–5.10)
RDW: 13.9 % (ref 11.0–15.0)
Total Lymphocyte: 23.8 %
WBC: 10.6 10*3/uL (ref 3.8–10.8)

## 2019-09-08 LAB — COMPLETE METABOLIC PANEL WITH GFR
AG Ratio: 1.8 (calc) (ref 1.0–2.5)
ALT: 9 U/L (ref 6–29)
AST: 10 U/L (ref 10–35)
Albumin: 4.6 g/dL (ref 3.6–5.1)
Alkaline phosphatase (APISO): 105 U/L (ref 37–153)
BUN: 16 mg/dL (ref 7–25)
CO2: 27 mmol/L (ref 20–32)
Calcium: 9.8 mg/dL (ref 8.6–10.4)
Chloride: 104 mmol/L (ref 98–110)
Creat: 0.81 mg/dL (ref 0.50–1.05)
GFR, Est African American: 97 mL/min/{1.73_m2} (ref >=60)
GFR, Est Non African American: 84 mL/min/{1.73_m2} (ref >=60)
Globulin: 2.6 g/dL (calc) (ref 1.9–3.7)
Glucose, Bld: 129 mg/dL — ABNORMAL HIGH (ref 65–99)
Potassium: 4.7 mmol/L (ref 3.5–5.3)
Sodium: 141 mmol/L (ref 135–146)
Total Bilirubin: 0.4 mg/dL (ref 0.2–1.2)
Total Protein: 7.2 g/dL (ref 6.1–8.1)

## 2019-09-08 LAB — HIV ANTIBODY (ROUTINE TESTING W REFLEX): HIV 1&2 Ab, 4th Generation: NONREACTIVE

## 2019-09-09 ENCOUNTER — Other Ambulatory Visit: Payer: Self-pay | Admitting: Family Medicine

## 2019-09-09 DIAGNOSIS — N3000 Acute cystitis without hematuria: Secondary | ICD-10-CM

## 2019-09-09 LAB — URINE CULTURE
MICRO NUMBER:: 10570505
SPECIMEN QUALITY:: ADEQUATE

## 2019-09-09 MED ORDER — NITROFURANTOIN MONOHYD MACRO 100 MG PO CAPS: 100.0000 mg | ORAL_CAPSULE | Freq: Two times a day (BID) | ORAL | 0 refills | Status: AC

## 2019-09-10 ENCOUNTER — Telehealth: Payer: Self-pay | Admitting: Family Medicine

## 2019-09-10 NOTE — Chronic Care Management (AMB) (Signed)
  Chronic Care Management   Note  09/10/2019 Name: Brandi Calderon MRN: [id number] DOB: February 15, 1966  Brandi Calderon is a 54 y.o. year old female who is a primary care patient of [name], [name], FNP. I reached out to Brandi Calderon by phone today in response to a referral sent by [name] PCP, Cyndia Skeeters FNP     [name] was given information about Chronic Care Management services today including:  1. CCM service includes personalized support from designated clinical staff supervised by her physician, including individualized plan of care and coordination with other care providers 2. 24/7 contact phone numbers for assistance for urgent and routine care needs. 3. Service will only be billed when office clinical staff spend 20 minutes or more in a month to coordinate care. 4. Only one practitioner may furnish and bill the service in a calendar month. 5. The patient may stop CCM services at any time (effective at the end of the month) by phone call to the office staff. 6. The patient will be responsible for cost sharing (co-pay) of up to 20% of the service fee (after annual deductible is met).  Patient agreed to services and verbal consent obtained.   Follow up plan: Telephone appointment with care management team member scheduled for:09/29/2019  Glenna Durand, LPN Health Advisor, [facility] ??[email or web address] ??[phone]

## 2019-09-17 ENCOUNTER — Telehealth: Payer: Self-pay | Admitting: Family Medicine

## 2019-09-17 NOTE — Chronic Care Management (AMB) (Signed)
  Care Management   Note  09/17/2019 Name: Brandi Calderon MRN: [id number] DOB: 02/03/1966  [name] is a 54 y.o. year old female who is a primary care patient of [name], [name], FNP and is actively engaged with the care management team. I reached out to [name] by phone today to assist with re-scheduling an initial visit with the Pharmacist  Follow up plan: Telephone appointment with care management team member scheduled for:10/15/2019  Noreene Larsson, [place], [place], [place] [address] Direct Dial: [phone] [email or web address]: [email or web address]

## 2019-09-21 ENCOUNTER — Other Ambulatory Visit (HOSPITAL_COMMUNITY)
Admission: RE | Admit: 2019-09-21 | Discharge: 2019-09-21 | Disposition: A | Discharge status: Home / Self Care | Payer: Medicare HMO | Source: Ambulatory Visit | Attending: Family Medicine | Admitting: Family Medicine

## 2019-09-21 ENCOUNTER — Encounter: Payer: Self-pay | Admitting: Family Medicine

## 2019-09-21 ENCOUNTER — Ambulatory Visit (INDEPENDENT_AMBULATORY_CARE_PROVIDER_SITE_OTHER): Payer: Medicare HMO | Admitting: Family Medicine

## 2019-09-21 ENCOUNTER — Other Ambulatory Visit: Payer: Self-pay

## 2019-09-21 VITALS — BP 116/63 | HR 74 | Temp 97.1°F | Resp 16 | Ht 61.0 in | Wt 147.0 lb

## 2019-09-21 DIAGNOSIS — Z01419 Encounter for gynecological examination (general) (routine) without abnormal findings: Secondary | ICD-10-CM | POA: Insufficient documentation

## 2019-09-21 DIAGNOSIS — Z1151 Encounter for screening for human papillomavirus (HPV): Secondary | ICD-10-CM | POA: Diagnosis not present

## 2019-09-21 DIAGNOSIS — Z780 Asymptomatic menopausal state: Secondary | ICD-10-CM | POA: Insufficient documentation

## 2019-09-21 DIAGNOSIS — Z124 Encounter for screening for malignant neoplasm of cervix: Secondary | ICD-10-CM | POA: Diagnosis not present

## 2019-09-21 NOTE — Progress Notes (Signed)
Subjective:    Patient ID: Brandi Calderon, female    DOB: 1965/08/02, 54 y.o.   MRN: [id number]  Brandi Calderon is a 55 y.o. female presenting on 09/21/2019 for Gynecologic Exam   HPI  [name] presents to clinic for her well woman exam with GYN PAP testing.  Denies any acute concerns today.  Offered and declined STI testing.  Reports has scheduled an appointment with endocrinology and dermatology, based off referrals provided at last visit.  Depression screen [facility] 2/9 09/07/2019 09/07/2019 06/02/2019  Decreased Interest 2 0 3  Down, Depressed, Hopeless 2 0 2  PHQ - 2 Score 4 0 5  Altered sleeping 3 - 3  Tired, decreased energy 3 - 3  Change in appetite 3 - 3  Feeling bad or failure about yourself  2 - 2  Trouble concentrating 3 - 3  Moving slowly or fidgety/restless 3 - 3  Suicidal thoughts 0 - 0  PHQ-9 Score 21 - 22  Difficult doing work/chores Not difficult at all - Very difficult    Social History   Tobacco Use  . Smoking status: Current Every Day Smoker    Packs/day: 3.00    Years: 33.00    Pack years: 99.00    Types: Cigarettes  . Smokeless tobacco: Never Used  Vaping Use  . Vaping Use: Never used  Substance Use Topics  . Alcohol use: No  . Drug use: Yes    Types: Marijuana    Comment: States she smokes MJ when she does not have any Tramadol.    Review of Systems  Constitutional: Negative.   HENT: Negative.   Eyes: Negative.   Respiratory: Negative.   Cardiovascular: Negative.   Gastrointestinal: Negative.   Endocrine: Negative.   Genitourinary: Negative.   Musculoskeletal: Negative.   Skin: Negative.   Allergic/Immunologic: Negative.   Neurological: Negative.   Hematological: Negative.   Psychiatric/Behavioral: Negative.    Per HPI unless specifically indicated above     Objective:    BP 116/63 (BP Location: Left Arm, Patient Position: Sitting, Cuff Size: Normal)   Pulse 74   Temp (!) 97.1 F (36.2 C) (Temporal)   Resp 16   Ht 5\' 1"   (1.549 m)   Wt 147 lb (66.7 kg)   SpO2 100%   BMI 27.78 kg/m   Wt Readings from Last 3 Encounters:  09/21/19 147 lb (66.7 kg)  09/07/19 145 lb (65.8 kg)  09/07/19 145 lb (65.8 kg)    Physical Exam Vitals reviewed.  Constitutional:      General: She is not in acute distress.    Appearance: Normal appearance. She is well-developed, well-groomed and overweight. She is not ill-appearing or toxic-appearing.  HENT:     Head: Normocephalic.     Nose:     Comments: Brandi Calderon is in place, covering mouth and nose  Eyes:     General: Lids are normal. Vision grossly intact. No scleral icterus.       Right eye: No discharge.        Left eye: No discharge.     Extraocular Movements: Extraocular movements intact.     Conjunctiva/sclera: Conjunctivae normal.     Pupils: Pupils are equal, round, and reactive to light.  Cardiovascular:     Pulses: Normal pulses.          Dorsalis pedis pulses are 2+ on the right side and 2+ on the left side.  Pulmonary:     Effort: Pulmonary effort is normal.  Abdominal:     General: Abdomen is flat. There is no distension.     Palpations: Abdomen is soft. There is no hepatomegaly or splenomegaly.     Tenderness: There is no abdominal tenderness. There is no guarding.     Hernia: No hernia is present. There is no hernia in the left inguinal area or right inguinal area.  Genitourinary:    General: Normal vulva.     Exam position: Lithotomy position.     Pubic Area: No rash or pubic lice.      Labia:        Right: No rash, tenderness, lesion or injury.        Left: No rash, tenderness, lesion or injury.      Urethra: No urethral pain, urethral swelling or urethral lesion.     Vagina: Normal. No signs of injury and foreign body. No vaginal discharge, erythema, tenderness, bleeding or lesions.     Cervix: Normal.     Uterus: Normal. Not enlarged and not tender.      Adnexa: Right adnexa normal and left adnexa normal.  Musculoskeletal:     Right lower leg:  No edema.     Left lower leg: No edema.  Feet:     Right foot:     Skin integrity: Skin integrity normal.     Left foot:     Skin integrity: Skin integrity normal.  Lymphadenopathy:     Lower Body: No right inguinal adenopathy. No left inguinal adenopathy.  Skin:    General: Skin is warm and dry.     Capillary Refill: Capillary refill takes less than 2 seconds.  Neurological:     General: No focal deficit present.     Mental Status: She is alert and oriented to person, place, and time.  Psychiatric:        Attention and Perception: Attention and perception normal.        Mood and Affect: Mood and affect normal.        Speech: Speech normal.        Behavior: Behavior normal. Behavior is cooperative.        Thought Content: Thought content normal.        Judgment: Judgment normal.    Results for orders placed or performed in visit on 09/07/19  Urine Culture   Specimen: Urine  Result Value Ref Range   MICRO NUMBER: [id number]    SPECIMEN QUALITY: Adequate    Sample Source URINE, CLEAN CATCH    STATUS: FINAL    ISOLATE 1: ESBL Escherichia coli (A)       Susceptibility   Esbl escherichia coli - URINE CULTURE, REFLEX    AMOX/CLAVULANIC 8 Sensitive     AMPICILLIN* >=32 Resistant      * Extended spectrum beta-lactamase (ESBL) producingorganisms demonstrate decreased activity withpenicillins, cephalosporins and aztreonam.    AMPICILLIN/SULBACTAM 16 Intermediate     CEFAZOLIN* >=64 Resistant      * Extended spectrum beta-lactamase (ESBL) producingorganisms demonstrate decreased activity withpenicillins, cephalosporins and aztreonam.For uncomplicated UTI caused by E. coli,K. pneumoniae or P. mirabilis: Cefazolin issusceptible if MIC <32 mcg/mL and predictssusceptible to the oral agents cefaclor, cefdinir,cefpodoxime, cefprozil, cefuroxime, cephalexinand loracarbef.    CEFEPIME 2 Resistant     CEFTRIAXONE 32 Resistant     CIPROFLOXACIN >=4 Resistant     LEVOFLOXACIN >=8 Resistant      ERTAPENEM <=0.5 Sensitive     GENTAMICIN <=1 Sensitive     IMIPENEM <=0.25 Sensitive  NITROFURANTOIN <=16 Sensitive     PIP/TAZO 32 Intermediate     TOBRAMYCIN <=1 Sensitive     TRIMETH/SULFA* >=320 Resistant      * Extended spectrum beta-lactamase (ESBL) producingorganisms demonstrate decreased activity withpenicillins, cephalosporins and aztreonam.For uncomplicated UTI caused by E. coli,K. pneumoniae or P. mirabilis: Cefazolin issusceptible if MIC <32 mcg/mL and predictssusceptible to the oral agents cefaclor, cefdinir,cefpodoxime, cefprozil, cefuroxime, cephalexinand loracarbef.Legend:S = Susceptible  I = IntermediateR = Resistant  NS = Not susceptible* = Not tested  NR = Not reported**NN = See antimicrobic comments  CBC with Differential  Result Value Ref Range   WBC 10.6 3.8 - 10.8 Thousand/uL   RBC 4.72 3.80 - 5.10 Million/uL   Hemoglobin 14.0 11.7 - 15.5 g/dL   HCT 43.1 35 - 45 %   MCV 91.3 80.0 - 100.0 fL   MCH 29.7 27.0 - 33.0 pg   MCHC 32.5 32.0 - 36.0 g/dL   RDW 13.9 11.0 - 15.0 %   Platelets 290 140 - 400 Thousand/uL   MPV 9.2 7.5 - 12.5 fL   Neutro Abs 7,367 1,500 - 7,800 cells/uL   Lymphs Abs 2,523 850 - 3,900 cells/uL   Absolute Monocytes 530 200 - 950 cells/uL   Eosinophils Absolute 127 15 - 500 cells/uL   Basophils Absolute 53 0 - 200 cells/uL   Neutrophils Relative % 69.5 %   Total Lymphocyte 23.8 %   Monocytes Relative 5.0 %   Eosinophils Relative 1.2 %   Basophils Relative 0.5 %  COMPLETE METABOLIC PANEL WITH GFR  Result Value Ref Range   Glucose, Bld 129 (H) 65 - 99 mg/dL   BUN 16 7 - 25 mg/dL   Creat 0.81 0.50 - 1.05 mg/dL   GFR, Est Non African American 84 > OR = 60 mL/min/1.62m2   GFR, Est African American 97 > OR = 60 mL/min/1.43m2   BUN/Creatinine Ratio NOT APPLICABLE 6 - 22 (calc)   Sodium 141 135 - 146 mmol/L   Potassium 4.7 3.5 - 5.3 mmol/L   Chloride 104 98 - 110 mmol/L   CO2 27 20 - 32 mmol/L   Calcium 9.8 8.6 - 10.4 mg/dL   Total Protein  7.2 6.1 - 8.1 g/dL   Albumin 4.6 3.6 - 5.1 g/dL   Globulin 2.6 1.9 - 3.7 g/dL (calc)   AG Ratio 1.8 1.0 - 2.5 (calc)   Total Bilirubin 0.4 0.2 - 1.2 mg/dL   Alkaline phosphatase (APISO) 105 37 - 153 U/L   AST 10 10 - 35 U/L   ALT 9 6 - 29 U/L  HIV Antibody (routine testing w rflx)  Result Value Ref Range   HIV 1&2 Ab, 4th Generation NON-REACTIVE NON-REACTI  POCT glycosylated hemoglobin (Hb A1C)  Result Value Ref Range   Hemoglobin A1C 7.7 (A) 4.0 - 5.6 %   HbA1c POC (<> result, manual entry) 7.7 4.0 - 5.6 %   HbA1c, POC (prediabetic range)     HbA1c, POC (controlled diabetic range) 7.7 (A) 0.0 - 7.0 %  POCT Glucose (CBG)  Result Value Ref Range   POC Glucose 159 (A) 70 - 99 mg/dl  POCT Urinalysis Dipstick  Result Value Ref Range   Color, UA Yellow    Clarity, UA clear    Glucose, UA Positive (A) Negative   Bilirubin, UA negative    Ketones, UA negative    Spec Grav, UA 1.015 1.010 - 1.025   Blood, UA trace    pH, UA 5.0 5.0 -  8.0   Protein, UA Negative Negative   Urobilinogen, UA 0.2 0.2 or 1.0 E.U./dL   Nitrite, UA positive    Leukocytes, UA Negative Negative   Appearance     Odor    POCT UA - Microalbumin  Result Value Ref Range   Microalbumin Ur, POC 20 mg/L   Creatinine, POC     Albumin/Creatinine Ratio, Urine, POC        Assessment & Plan:   Problem List Items Addressed This Visit      Other   Visit for gynecologic examination    Well woman GYN exam with PAP testing.  Offered and declined STI testing.  Discussed if testing was WNL would repeat in 3 years.  No acute concerns today.  Plan: 1. PAP taken and sent to lab for processing.       Other Visit Diagnoses    Screening for cervical cancer    -  Primary   Relevant Orders   Cytology - PAP      No orders of the defined types were placed in this encounter.     Follow up plan: Return in about 3 months (around 12/22/2019) for DM follow up.   Harlin Rain, [facility]  Practitioner [facility] 09/21/2019, 4:17 PM

## 2019-09-21 NOTE — Patient Instructions (Signed)
We have sent your PAP to the lab for testing.  Will contact you once the results are received.  Keep your upcoming appointment with Endocrinology for your diabetes management.  Keep your upcoming appointment with Dermatology for evaluation of the skin lesions.  We will plan to see you back in 3 months for diabetes re-evaluation  You will receive a survey after today's visit either digitally by e-mail or paper by [place] mail. Your experiences and feedback matter to [place].  Please respond so we know how we are doing as we provide care for you.  Call us with any questions/concerns/needs.  It is my goal to be available to you for your health concerns.  Thanks for choosing me to be a partner in your healthcare needs!  Harlin Rain, FNP-C Family Nurse Practitioner [facility] Phone: [phone]

## 2019-09-21 NOTE — Assessment & Plan Note (Signed)
Well woman GYN exam with PAP testing.  Offered and declined STI testing.  Discussed if testing was WNL would repeat in 3 years.  No acute concerns today.  Plan: 1. PAP taken and sent to lab for processing.

## 2019-09-23 ENCOUNTER — Other Ambulatory Visit: Payer: Self-pay

## 2019-09-23 ENCOUNTER — Ambulatory Visit
Admission: RE | Admit: 2019-09-23 | Discharge: 2019-09-23 | Disposition: A | Discharge status: Home / Self Care | Payer: Medicare HMO | Source: Ambulatory Visit | Attending: Family Medicine | Admitting: Family Medicine

## 2019-09-23 DIAGNOSIS — I10 Essential (primary) hypertension: Secondary | ICD-10-CM | POA: Insufficient documentation

## 2019-09-23 DIAGNOSIS — E119 Type 2 diabetes mellitus without complications: Secondary | ICD-10-CM | POA: Diagnosis not present

## 2019-09-23 DIAGNOSIS — R0600 Dyspnea, unspecified: Secondary | ICD-10-CM

## 2019-09-23 DIAGNOSIS — R0609 Other forms of dyspnea: Secondary | ICD-10-CM | POA: Insufficient documentation

## 2019-09-23 DIAGNOSIS — E785 Hyperlipidemia, unspecified: Secondary | ICD-10-CM | POA: Insufficient documentation

## 2019-09-23 DIAGNOSIS — J449 Chronic obstructive pulmonary disease, unspecified: Secondary | ICD-10-CM | POA: Diagnosis not present

## 2019-09-23 NOTE — Progress Notes (Signed)
*  PRELIMINARY RESULTS* Echocardiogram 2D Echocardiogram has been performed.  Brandi Calderon 09/23/2019, 10:39 AM

## 2019-09-27 LAB — CYTOLOGY - PAP
Adequacy: ABSENT
Comment: NEGATIVE
Diagnosis: NEGATIVE
High risk HPV: NEGATIVE

## 2019-09-28 ENCOUNTER — Telehealth: Payer: Self-pay

## 2019-09-28 NOTE — Telephone Encounter (Signed)
Called pt she wants to know why her left hand is still get numb?  KP

## 2019-09-28 NOTE — Telephone Encounter (Signed)
Called pt told her it could be related to diabetes told her when and where her appt is and with Dr. Honor Junes. Pt verbalized understanding.  KP

## 2019-09-28 NOTE — Telephone Encounter (Signed)
We discussed at her last office visit, could be related to her diabetes.  She was referred to Endocrinology with [facility] and appears she has an upcoming appointment with them for August 9th with Dr. Honor Junes.

## 2019-09-28 NOTE — Telephone Encounter (Signed)
Copied from [place] [id number]. Topic: General - Other >> Sep 28, 2019  1:17 PM Leward Quan A wrote: Reason for CRM: Patient called to ask Cyndia Skeeters to please give her a call with the results of the Echocardiogram that was done on 09/23/19. Can be reached at Ph# [phone]

## 2019-09-28 NOTE — Telephone Encounter (Signed)
Echo was normal.  I had sent her a portal message through [place].  Does she have any specific questions?

## 2019-09-29 ENCOUNTER — Telehealth: Payer: Medicare HMO

## 2019-10-05 ENCOUNTER — Ambulatory Visit: Payer: Self-pay

## 2019-10-05 NOTE — Chronic Care Management (AMB) (Signed)
  Care Management   Follow Up Note   10/05/2019 Name: Brandi Calderon MRN: [id number] DOB: February 02, 1966  Referred by: [name], FNP Reason for referral : [place] is a 54 y.o. year old female who is a primary care patient of [name], FNP. The care management team was consulted for assistance with care management and care coordination needs.    Review of patient status, including review of consultants reports, relevant laboratory and other test results, and collaboration with appropriate care team members and the patient's provider was performed as part of comprehensive patient evaluation and provision of chronic care management services.    LCSW completed CCM outreach attempt today but was unable to reach patient successfully. A HIPPA compliant voice message was left encouraging patient to return call once available. LCSW rescheduled CCM SW appointment as well.  A HIPPA compliant phone message was left for the patient providing contact information and requesting a return call.   [name], BSW, MSW, [name] Phone: [phone]

## 2019-10-07 ENCOUNTER — Ambulatory Visit: Payer: Self-pay | Admitting: General Practice

## 2019-10-07 ENCOUNTER — Telehealth: Payer: Medicare HMO

## 2019-10-07 NOTE — Chronic Care Management (AMB) (Signed)
  Chronic Care Management   Outreach Note  10/07/2019 Name: Brandi Calderon MRN: [id number] DOB: May 13, 1965  Referred by: [name], FNP Reason for referral : Chronic Care Management (initial outreach call: RNCM Chronic Disease Management and Care Coordiantion Needs)   An unsuccessful telephone outreach was attempted today. The patient was referred to the case management team for assistance with care management and care coordination.   Follow Up Plan: A HIPPA compliant phone message was left for the patient providing contact information and requesting a return call.   Noreene Larsson RN, MSN, [place] Mobile: [phone]

## 2019-10-08 ENCOUNTER — Telehealth: Payer: Self-pay | Admitting: Family Medicine

## 2019-10-08 NOTE — Chronic Care Management (AMB) (Signed)
  Care Management   Note  10/08/2019 Name: Burnadette Baskett MRN: [id number] DOB: 11-Apr-1965  Crysten Kaman is a 54 y.o. year old female who is a primary care patient of [name], [name], FNP and is actively engaged with the care management team. I reached out to [name] by phone today to assist with re-scheduling an initial visit with the RN Case Manager  Follow up plan: Telephone appointment with care management team member scheduled for:11/04/2019  Noreene Larsson, [place], [place], [place] [address] Direct Dial: [phone] [email or web address] Website: [email or web address]

## 2019-10-15 ENCOUNTER — Ambulatory Visit: Payer: Medicare HMO | Admitting: Pharmacist

## 2019-10-15 DIAGNOSIS — E118 Type 2 diabetes mellitus with unspecified complications: Secondary | ICD-10-CM

## 2019-10-15 NOTE — Chronic Care Management (AMB) (Signed)
Chronic Care Management   Follow Up Note   10/15/2019 Name: Brandi Calderon MRN: [id number] DOB: 01-24-1966  Referred by: [name], FNP Reason for referral : Chronic Care Management (Initial Patient Outreach Call)   Brandi Calderon is a 54 y.o. year old female who is a primary care patient of [name], FNP. The CCM team was consulted for assistance with chronic disease management and care coordination needs.    I reached out to [place] by phone today as scheduled by Care Guide.  Review of patient status, including review of consultants reports, relevant laboratory and other test results, and collaboration with appropriate care team members and the patient's provider was performed as part of comprehensive patient evaluation and provision of chronic care management services.     Outpatient Encounter Medications as of 10/15/2019  Medication Sig  . Albuterol Sulfate 108 (90 Base) MCG/ACT AEPB Inhale into the lungs. Use 2 puffs 4 times a day prn  . Aspirin-Salicylamide-Caffeine (BC HEADACHE POWDER PO) Take 4 packets by mouth daily as needed (pain).  [name] Kitchen atorvastatin (LIPITOR) 20 MG tablet TAKE 1 TABLET (20 MG TOTAL) BY MOUTH DAILY AT 6 PM.  . buPROPion (WELLBUTRIN XL) 150 MG 24 hr tablet   . cetirizine (ZYRTEC) 10 MG tablet Take by mouth.  . citalopram (CELEXA) 20 MG tablet Take 40 mg by mouth 2 (two) times daily.  (Patient not taking: Reported on 09/21/2019)  . clonazePAM (KLONOPIN) 0.5 MG tablet Take 0.5 mg by mouth 2 (two) times daily as needed for anxiety (Sometimes takes 3 tablets a day).   . cloNIDine (CATAPRES) 0.1 MG tablet Take by mouth.  . diclofenac (VOLTAREN) 75 MG EC tablet Take by mouth. (Patient not taking: Reported on 09/21/2019)  . Dulaglutide (TRULICITY) 3 SW/9.6PR SOPN Inject 3 mg into the skin once a week.  . DULoxetine (CYMBALTA) 30 MG capsule 1 capsule daily.  . fluticasone (FLONASE) 50 MCG/ACT nasal spray Place 2 sprays into both nostrils  daily.  . Fluticasone Propionate (FLONASE NA) Place into the nose as needed. Use 2 spray in each nostril prn  . Fluticasone-Umeclidin-Vilant (TRELEGY ELLIPTA) 100-62.5-25 MCG/INH AEPB Inhale 1 puff into the lungs daily.  . furosemide (LASIX) 40 MG tablet Take by mouth.  . gabapentin (NEURONTIN) 600 MG tablet TAKE 1 TABLET BY MOUTH THREE TIMES A DAY  . hydrOXYzine (VISTARIL) 25 MG capsule Take 25-50 mg by mouth at bedtime as needed.  [name] Kitchen JANUVIA 100 MG tablet Take 100 mg by mouth daily.  . medroxyPROGESTERone (PROVERA) 10 MG tablet Take 1 tablet (10 mg total) by mouth daily.  . metFORMIN (GLUCOPHAGE) 1000 MG tablet TAKE ONE TABLET BY MOUTH 2 TIMES A DAY WITH A MEAL  . omega-3 acid ethyl esters (LOVAZA) 1 g capsule TAKE 1 CAPSULE (1 G TOTAL) BY MOUTH 2 (TWO) TIMES DAILY.  [name] Lancets 91M MISC   . ONETOUCH VERIO test strip   . pantoprazole (PROTONIX) 40 MG tablet Take 1 tablet (40 mg total) by mouth daily.  . SUMAtriptan (IMITREX) 50 MG tablet TAKE 1 TAB BY MOUTH ONCE AS NEEDED FOR UP TO 1 DOSE FOR MIGRAINE. MAY REPEAT DOSE IN 2HRS IF HEADACHE PERSISTS FOR MAX DOSE 24 HOURS  . SYMBICORT 160-4.5 MCG/ACT inhaler SMARTSIG:2 Puff(s) By Mouth Twice Daily  . tiZANidine (ZANAFLEX) 4 MG tablet Take 1 tablet (4 mg total) by mouth 2 (two) times daily as needed for muscle spasms.  . traMADol (ULTRAM) 50 MG tablet Take by  mouth every 6 (six) hours as needed.   No facility-administered encounter medications on file as of 10/15/2019.    Goals Addressed            This Visit's Progress   . PharmD - Medication Management       CARE PLAN ENTRY (see longitudinal plan of care for additional care plan information)   Current Barriers:  . Chronic Disease Management support, education, and care coordination needs related to T2DM, neuropathy, depression/anxiety, chronic migraines  Pharmacist Clinical Goal(s):  [name] Kitchen Over the next 30 days, patient will work with CM Pharmacist to complete medication  review address needs identified.  Interventions: . Provider and Inter-disciplinary care team collaboration (see longitudinal plan of care) . Perform chart review . Reschedule appointment to complete medication review as requested - patient reports that she is just leaving home to head to an appointment . Patient confirms plan to attend new patient appointment with Endocrinology on 8/9, as referred by PCP o Encourage patient to bring a current medication list and/or her medications with her to this appointment to review with provider.  Patient Self Care Activities:  . Calls provider office for new concerns or questions   Initial goal documentation        Plan  Telephone follow up appointment with care management team member scheduled for: 8/11 at 2 pm  Harlow Asa, PharmD, [place] [phone]

## 2019-10-15 NOTE — Patient Instructions (Signed)
Thank you allowing the Chronic Care Management Team to be a part of your care! It was a pleasure speaking with you today!     CCM (Chronic Care Management) Team    Noreene Larsson RN, MSN, CCM Nurse Care Coordinator  [phone]   Harlow Asa PharmD  Clinical Pharmacist  [phone]   [name] LCSW Clinical Social Worker [phone]  Visit Information  Goals Addressed            This Visit's Progress    PharmD - Medication Management       CARE PLAN ENTRY (see longitudinal plan of care for additional care plan information)   Current Barriers:   Chronic Disease Management support, education, and care coordination needs related to T2DM, neuropathy, depression/anxiety, chronic migraines  Pharmacist Clinical Goal(s):   Over the next 30 days, patient will work with CM Pharmacist to complete medication review address needs identified.  Interventions:  Provider and Inter-disciplinary care team collaboration (see longitudinal plan of care)  Perform chart review  Reschedule appointment to complete medication review as requested - patient reports that she is just leaving home to head to an appointment  Patient confirms plan to attend new patient appointment with Endocrinology on 8/9, as referred by PCP o Encourage patient to bring a current medication list and/or her medications with her to this appointment to review with provider.  Patient Self Care Activities:   Calls provider office for new concerns or questions   Initial goal documentation        Patient verbalizes understanding of instructions provided today.   Telephone follow up appointment with care management team member scheduled for: 8/11 at 2 pm  Harlow Asa, PharmD, [place] [phone]

## 2019-11-04 ENCOUNTER — Ambulatory Visit (INDEPENDENT_AMBULATORY_CARE_PROVIDER_SITE_OTHER): Payer: Medicare HMO | Admitting: General Practice

## 2019-11-04 ENCOUNTER — Telehealth: Payer: Medicare HMO | Admitting: General Practice

## 2019-11-04 DIAGNOSIS — F32A Depression, unspecified: Secondary | ICD-10-CM

## 2019-11-04 DIAGNOSIS — G894 Chronic pain syndrome: Secondary | ICD-10-CM

## 2019-11-04 DIAGNOSIS — F419 Anxiety disorder, unspecified: Secondary | ICD-10-CM

## 2019-11-04 DIAGNOSIS — R0609 Other forms of dyspnea: Secondary | ICD-10-CM

## 2019-11-04 DIAGNOSIS — E118 Type 2 diabetes mellitus with unspecified complications: Secondary | ICD-10-CM

## 2019-11-04 DIAGNOSIS — Z794 Long term (current) use of insulin: Secondary | ICD-10-CM

## 2019-11-04 NOTE — Patient Instructions (Signed)
Visit Information  Goals Addressed              This Visit's Progress     RNCM: Pt-"I have COPD and other health problems" (pt-stated)        CARE PLAN ENTRY (see longtitudinal plan of care for additional care plan information)  Current Barriers:   Chronic Disease Management support, education, and care coordination needs related to COPD, Anxiety, Depression, and chronic pain syndrome  Clinical Goal(s) related to COPD, Anxiety, Depression, and chronic pain syndrome :  Over the next 120 days, patient will:   Work with the care management team to address educational, disease management, and care coordination needs   Begin or continue self health monitoring activities as directed today  adhere to a heart healthy/ADA diet and consider smoking cessation  Call provider office for new or worsened signs and symptoms Oxygen saturation lower than established parameter, Chest pain, Shortness of breath, and New or worsened symptom related to depression, anxiety  and chronic pain syndrome  Call care management team with questions or concerns  Verbalize basic understanding of patient centered plan of care established today  Interventions related to COPD, Anxiety, Depression, and chronic pain syndrome :   Evaluation of current treatment plans and patient's adherence to plan as established by provider.  The patient states that she has a lot of problems with her breathing. She knows that she needs to stop smoking but her smoking increased when her son died in 05-Jun-2014.  She is not interested in smoking cessation at this time, but knows there are resources to help with smoking cessation. o Evaluation of sleeping patterns. The patient states she does not sleep well and is often tired during the day and has a hard time falling asleep and staying asleep. She has been told that she snores "bad". Per the patient she has not ever been tested for sleep apnea. Will collaborate with pcp for possible referral to  pulmonology.   Assessed patient understanding of disease states. The patient willing to work with the CCM team in the management of her chronic conditions. The patient has a lot of anxiety and depression. She denies suicidal ideation.  Gave the patient contact information for the CCM team and resources available for her in dealing with "panic attacks".  The patient expressed she does not have friends or like to go out in public.  The patient prefers to stay home. She is driving again after 20 years but often she can not go in stores because she panics. She usually waits until her daughters can go with her or she lets them go get things for her. Has initial outreach with LCSW coming up. Discuss team support in managing her chronic conditions.   Assessed patient's education and care coordination needs.  Education information to be sent by my chart, mail and EMMI System- will text the patient per the patients request the [facility] contact information.   Provided disease specific education to patient.  Education on ADA diet and smoking cessation.    Collaborated with appropriate clinical care team members regarding patient needs.  Is currently working with the pharmacist and has upcoming appointment with LCSW.   Patient Self Care Activities related to COPD, Anxiety, Depression, and Chronic pain syndrome :   Patient is unable to independently self-manage chronic health conditions  Initial goal documentation       RNCM: Pt-"I think my sugar dropped yesterday" (pt-stated)        CARE PLAN ENTRY (see  longtitudinal plan of care for additional care plan information)  Objective:  Lab Results  Component Value Date   HGBA1C 7.7 (A) 09/07/2019   HGBA1C 7.7 09/07/2019   HGBA1C 7.7 (A) 09/07/2019    Lab Results  Component Value Date   CREATININE 0.81 09/07/2019   CREATININE 0.74 01/04/2019   CREATININE 0.77 10/10/2017     No results found for: EGFR  Current Barriers:   Knowledge Deficits related  to basic Diabetes pathophysiology and self care/management  Knowledge Deficits related to medications used for management of diabetes  Does not use cbg meter as prescribed  Limited Social Support  Case Manager Clinical Goal(s):  Over the next 120 days, patient will demonstrate improved adherence to prescribed treatment plan for diabetes self care/management as evidenced by:   daily monitoring and recording of CBG   adherence to ADA/ carb modified diet  exercise 4 days/week  adherence to prescribed medication regimen  Interventions:   Provided education to patient about basic DM disease process  Discussed plans with patient for ongoing care management follow up and provided patient with direct contact information for care management team  Provided patient with written educational materials related to hypo and hyperglycemia and importance of correct treatment.  The patient states she had an episode yesterday when she was riding with her daughter where she got shaky, started sweating and went out. She could hear her daughter talking to her but could not respond. Her daughter put lifesavers in her mouth and found some peanut butter crackers that she broke off and gave her and she came around. Education provided that the patient likely had a hypoglycemic event and this was the reason she had the symptoms she did. Evaluation of the patients dietary habits. The patient does not eat 3 meals a day. This event happened in the afternoon and the last time she had eaten was breakfast. Extensive education on eating smaller meals and not skipping meals and also eating snacks.  Advised the patient to have sugary snakes with her when she is away from home. She states she usually knows when they are happening but this came on quickly. Will send information to the patient on management of hyperglycemia and hypoglycemia.   Reviewed scheduled/upcoming provider appointments including: has an appointment with  endocrinology on 11-08-2019 at 330 pm  Advised patient, providing education and rationale, to check cbg bid and record, calling pcp or endocrinologist  for findings outside established parameters.    Review of patient status, including review of consultants reports, relevant laboratory and other test results, and medications completed.  Patient Self Care Activities:   UNABLE to independently manage diabetes as evidence of hypoglycemic events and fluctuation of hemoglobin A1C values: 7.7% on 09-07-2019, 8.3% on 06-02-2019.  Checks blood sugars as prescribed and utilize hyper and hypoglycemia protocol as needed  Adheres to prescribed ADA/carb modified  Initial goal documentation        Brandi Calderon was given information about Chronic Care Management services today including:  1. CCM service includes personalized support from designated clinical staff supervised by her physician, including individualized plan of care and coordination with other care providers 2. 24/7 contact phone numbers for assistance for urgent and routine care needs. 3. Service will only be billed when office clinical staff spend 20 minutes or more in a month to coordinate care. 4. Only one practitioner may furnish and bill the service in a calendar month. 5. The patient may stop CCM services at any time (effective at  the end of the month) by phone call to the office staff. 6. The patient will be responsible for cost sharing (co-pay) of up to 20% of the service fee (after annual deductible is met).  Patient agreed to services and verbal consent obtained.   Patient verbalizes understanding of instructions provided today.   Telephone follow up appointment with care management team member scheduled for: 01-06-2020 at 1:45pm  Noreene Larsson RN, MSN, [place] Mobile: [phone]

## 2019-11-04 NOTE — Chronic Care Management (AMB) (Signed)
Chronic Care Management   Initial Visit Note  11/04/2019 Name: Brandi Calderon MRN: [id number] DOB: 02-16-1966  Referred by: [name], FNP Reason for referral : Chronic Care Management (RNCM Initial outreach call: Chronic Disease Management and Care Coordination Needs)   Brandi Calderon is a 54 y.o. year old female who is a primary care patient of [name], FNP. The CCM team was consulted for assistance with chronic disease management and care coordination needs related to COPD, DMII, Anxiety, Depression and chronic pain syndrome  Review of patient status, including review of consultants reports, relevant laboratory and other test results, and collaboration with appropriate care team members and the patient's provider was performed as part of comprehensive patient evaluation and provision of chronic care management services.    SDOH (Social Determinants of Health) assessments performed: Yes See Care Plan activities for detailed interventions related to SDOH  SDOH Interventions     Most Recent Value  SDOH Interventions  Physical Activity Interventions Other (Comments)  [no structured activity]  Social Connections Interventions Other (Comment)  [Good family support- likes to stay at home]  Depression Interventions/Treatment  --  Brandi Calderon for LCSW]       Medications: Outpatient Encounter Medications as of 11/04/2019  Medication Sig  . Albuterol Sulfate 108 (90 Base) MCG/ACT AEPB Inhale into the lungs. Use 2 puffs 4 times a day prn  . Aspirin-Salicylamide-Caffeine (BC HEADACHE POWDER PO) Take 4 packets by mouth daily as needed (pain).  [name] Kitchen atorvastatin (LIPITOR) 20 MG tablet TAKE 1 TABLET (20 MG TOTAL) BY MOUTH DAILY AT 6 PM.  . buPROPion (WELLBUTRIN XL) 150 MG 24 hr tablet   . cetirizine (ZYRTEC) 10 MG tablet Take by mouth.  . citalopram (CELEXA) 20 MG tablet Take 40 mg by mouth 2 (two) times daily.  (Patient not taking: Reported on 09/21/2019)  . clonazePAM (KLONOPIN) 0.5  MG tablet Take 0.5 mg by mouth 2 (two) times daily as needed for anxiety (Sometimes takes 3 tablets a day).   . cloNIDine (CATAPRES) 0.1 MG tablet Take by mouth.  . diclofenac (VOLTAREN) 75 MG EC tablet Take by mouth. (Patient not taking: Reported on 09/21/2019)  . Dulaglutide (TRULICITY) 3 VV/6.1YW SOPN Inject 3 mg into the skin once a week.  . DULoxetine (CYMBALTA) 30 MG capsule 1 capsule daily.  . fluticasone (FLONASE) 50 MCG/ACT nasal spray Place 2 sprays into both nostrils daily.  . Fluticasone Propionate (FLONASE NA) Place into the nose as needed. Use 2 spray in each nostril prn  . Fluticasone-Umeclidin-Vilant (TRELEGY ELLIPTA) 100-62.5-25 MCG/INH AEPB Inhale 1 puff into the lungs daily.  . furosemide (LASIX) 40 MG tablet Take by mouth.  . gabapentin (NEURONTIN) 600 MG tablet TAKE 1 TABLET BY MOUTH THREE TIMES A DAY  . hydrOXYzine (VISTARIL) 25 MG capsule Take 25-50 mg by mouth at bedtime as needed.  [name] Kitchen JANUVIA 100 MG tablet Take 100 mg by mouth daily.  . medroxyPROGESTERone (PROVERA) 10 MG tablet Take 1 tablet (10 mg total) by mouth daily.  . metFORMIN (GLUCOPHAGE) 1000 MG tablet TAKE ONE TABLET BY MOUTH 2 TIMES A DAY WITH A MEAL  . omega-3 acid ethyl esters (LOVAZA) 1 g capsule TAKE 1 CAPSULE (1 G TOTAL) BY MOUTH 2 (TWO) TIMES DAILY.  [name] Lancets 73X MISC   . ONETOUCH VERIO test strip   . pantoprazole (PROTONIX) 40 MG tablet Take 1 tablet (40 mg total) by mouth daily.  . SUMAtriptan (IMITREX) 50 MG tablet TAKE 1 TAB BY  MOUTH ONCE AS NEEDED FOR UP TO 1 DOSE FOR MIGRAINE. MAY REPEAT DOSE IN 2HRS IF HEADACHE PERSISTS FOR MAX DOSE 24 HOURS  . SYMBICORT 160-4.5 MCG/ACT inhaler SMARTSIG:2 Puff(s) By Mouth Twice Daily  . tiZANidine (ZANAFLEX) 4 MG tablet Take 1 tablet (4 mg total) by mouth 2 (two) times daily as needed for muscle spasms.  . traMADol (ULTRAM) 50 MG tablet Take by mouth every 6 (six) hours as needed.   No facility-administered encounter medications on file as of  11/04/2019.     Objective:    Goals Addressed              This Visit's Progress   .  RNCM: Pt-"I have COPD and other health problems" (pt-stated)        CARE PLAN ENTRY (see longtitudinal plan of care for additional care plan information)  Current Barriers:  . Chronic Disease Management support, education, and care coordination needs related to COPD, Anxiety, Depression, and chronic pain syndrome  Clinical Goal(s) related to COPD, Anxiety, Depression, and chronic pain syndrome :  Over the next 120 days, patient will:  . Work with the care management team to address educational, disease management, and care coordination needs  . Begin or continue self health monitoring activities as directed today  adhere to a heart healthy/ADA diet and consider smoking cessation . Call provider office for new or worsened signs and symptoms Oxygen saturation lower than established parameter, Chest pain, Shortness of breath, and New or worsened symptom related to depression, anxiety  and chronic pain syndrome . Call care management team with questions or concerns . Verbalize basic understanding of patient centered plan of care established today  Interventions related to COPD, Anxiety, Depression, and chronic pain syndrome :  . Evaluation of current treatment plans and patient's adherence to plan as established by provider.  The patient states that she has a lot of problems with her breathing. She knows that she needs to stop smoking but her smoking increased when her son died in 06-01-2014.  She is not interested in smoking cessation at this time, but knows there are resources to help with smoking cessation. o Evaluation of sleeping patterns. The patient states she does not sleep well and is often tired during the day and has a hard time falling asleep and staying asleep. She has been told that she snores "bad". Per the patient she has not ever been tested for sleep apnea. Will collaborate with pcp for possible  referral to pulmonology.  . Assessed patient understanding of disease states. The patient willing to work with the CCM team in the management of her chronic conditions. The patient has a lot of anxiety and depression. She denies suicidal ideation.  Gave the patient contact information for the CCM team and resources available for her in dealing with "panic attacks".  The patient expressed she does not have friends or like to go out in public.  The patient prefers to stay home. She is driving again after 20 years but often she can not go in stores because she panics. She usually waits until her daughters can go with her or she lets them go get things for her. Has initial outreach with LCSW coming up. Discuss team support in managing her chronic conditions.  . Assessed patient's education and care coordination needs.  Education information to be sent by my chart, mail and EMMI System- will text the patient per the patients request the [facility] contact information.  . Provided disease specific  education to patient.  Education on ADA diet and smoking cessation.   Nash Dimmer with appropriate clinical care team members regarding patient needs.  Is currently working with the pharmacist and has upcoming appointment with LCSW.   Patient Self Care Activities related to COPD, Anxiety, Depression, and Chronic pain syndrome :  . Patient is unable to independently self-manage chronic health conditions  Initial goal documentation     .  RNCM: Pt-"I think my sugar dropped yesterday" (pt-stated)        CARE PLAN ENTRY (see longtitudinal plan of care for additional care plan information)  Objective:  Lab Results  Component Value Date   HGBA1C 7.7 (A) 09/07/2019   HGBA1C 7.7 09/07/2019   HGBA1C 7.7 (A) 09/07/2019 .   Lab Results  Component Value Date   CREATININE 0.81 09/07/2019   CREATININE 0.74 01/04/2019   CREATININE 0.77 10/10/2017 .   [name] Kitchen No results found for: EGFR  Current Barriers:  [name] Kitchen Knowledge  Deficits related to basic Diabetes pathophysiology and self care/management . Knowledge Deficits related to medications used for management of diabetes . Does not use cbg meter as prescribed . Limited Social Support  Case Manager Clinical Goal(s):  Over the next 120 days, patient will demonstrate improved adherence to prescribed treatment plan for diabetes self care/management as evidenced by:  . daily monitoring and recording of CBG  . adherence to ADA/ carb modified diet . exercise 4 days/week . adherence to prescribed medication regimen  Interventions:  . Provided education to patient about basic DM disease process . Discussed plans with patient for ongoing care management follow up and provided patient with direct contact information for care management team . Provided patient with written educational materials related to hypo and hyperglycemia and importance of correct treatment.  The patient states she had an episode yesterday when she was riding with her daughter where she got shaky, started sweating and went out. She could hear her daughter talking to her but could not respond. Her daughter put lifesavers in her mouth and found some peanut butter crackers that she broke off and gave her and she came around. Education provided that the patient likely had a hypoglycemic event and this was the reason she had the symptoms she did. Evaluation of the patients dietary habits. The patient does not eat 3 meals a day. This event happened in the afternoon and the last time she had eaten was breakfast. Extensive education on eating smaller meals and not skipping meals and also eating snacks.  Advised the patient to have sugary snakes with her when she is away from home. She states she usually knows when they are happening but this came on quickly. Will send information to the patient on management of hyperglycemia and hypoglycemia.  . Reviewed scheduled/upcoming provider appointments including: has an  appointment with endocrinology on 11-08-2019 at 330 pm . Advised patient, providing education and rationale, to check cbg bid and record, calling pcp or endocrinologist  for findings outside established parameters.   . Review of patient status, including review of consultants reports, relevant laboratory and other test results, and medications completed.  Patient Self Care Activities:  . UNABLE to independently manage diabetes as evidence of hypoglycemic events and fluctuation of hemoglobin A1C values: 7.7% on 09-07-2019, 8.3% on 06-02-2019. [name] Kitchen Checks blood sugars as prescribed and utilize hyper and hypoglycemia protocol as needed . Adheres to prescribed ADA/carb modified  Initial goal documentation         Ms. Gowdy was given  information about Chronic Care Management services today including:  1. CCM service includes personalized support from designated clinical staff supervised by her physician, including individualized plan of care and coordination with other care providers 2. 24/7 contact phone numbers for assistance for urgent and routine care needs. 3. Service will only be billed when office clinical staff spend 20 minutes or more in a month to coordinate care. 4. Only one practitioner may furnish and bill the service in a calendar month. 5. The patient may stop CCM services at any time (effective at the end of the month) by phone call to the office staff. 6. The patient will be responsible for cost sharing (co-pay) of up to 20% of the service fee (after annual deductible is met).  Patient agreed to services and verbal consent obtained.   Plan:   Telephone follow up appointment with care management team member scheduled for: 01-06-2020 at 1:45 pm  Noreene Larsson RN, MSN, [place] Mobile: [phone]

## 2019-11-10 ENCOUNTER — Ambulatory Visit: Payer: Self-pay | Admitting: Pharmacist

## 2019-11-10 DIAGNOSIS — E118 Type 2 diabetes mellitus with unspecified complications: Secondary | ICD-10-CM

## 2019-11-10 NOTE — Chronic Care Management (AMB) (Signed)
Chronic Care Management   Follow Up Note   11/10/2019 Name: Brandi Calderon MRN: [id number] DOB: 1965-11-27  Referred by: [name], FNP Reason for referral : Chronic Care Management (Patient Phone Call)   Brandi Calderon is a 54 y.o. year old female who is a primary care patient of [name], FNP. The CCM team was consulted for assistance with chronic disease management and care coordination needs.    I reached out to [name] by phone today.   Review of patient status, including review of consultants reports, relevant laboratory and other test results, and collaboration with appropriate care team members and the patient's provider was performed as part of comprehensive patient evaluation and provision of chronic care management services.     Outpatient Encounter Medications as of 11/10/2019  Medication Sig  . Albuterol Sulfate 108 (90 Base) MCG/ACT AEPB Inhale into the lungs. Use 2 puffs 4 times a day prn  . Aspirin-Salicylamide-Caffeine (BC HEADACHE POWDER PO) Take 4 packets by mouth daily as needed (pain).  [name] Kitchen atorvastatin (LIPITOR) 20 MG tablet TAKE 1 TABLET (20 MG TOTAL) BY MOUTH DAILY AT 6 PM.  . buPROPion (WELLBUTRIN XL) 150 MG 24 hr tablet   . cetirizine (ZYRTEC) 10 MG tablet Take by mouth.  . citalopram (CELEXA) 20 MG tablet Take 40 mg by mouth 2 (two) times daily.  (Patient not taking: Reported on 09/21/2019)  . clonazePAM (KLONOPIN) 0.5 MG tablet Take 0.5 mg by mouth 2 (two) times daily as needed for anxiety (Sometimes takes 3 tablets a day).   . cloNIDine (CATAPRES) 0.1 MG tablet Take by mouth.  . diclofenac (VOLTAREN) 75 MG EC tablet Take by mouth. (Patient not taking: Reported on 09/21/2019)  . Dulaglutide (TRULICITY) 3 IR/4.4RX SOPN Inject 3 mg into the skin once a week.  . DULoxetine (CYMBALTA) 30 MG capsule 1 capsule daily.  . fluticasone (FLONASE) 50 MCG/ACT nasal spray Place 2 sprays into both nostrils daily.  . Fluticasone Propionate  (FLONASE NA) Place into the nose as needed. Use 2 spray in each nostril prn  . Fluticasone-Umeclidin-Vilant (TRELEGY ELLIPTA) 100-62.5-25 MCG/INH AEPB Inhale 1 puff into the lungs daily.  . furosemide (LASIX) 40 MG tablet Take by mouth.  . gabapentin (NEURONTIN) 600 MG tablet TAKE 1 TABLET BY MOUTH THREE TIMES A DAY  . hydrOXYzine (VISTARIL) 25 MG capsule Take 25-50 mg by mouth at bedtime as needed.  [name] Kitchen JANUVIA 100 MG tablet Take 100 mg by mouth daily.  . medroxyPROGESTERone (PROVERA) 10 MG tablet Take 1 tablet (10 mg total) by mouth daily.  . metFORMIN (GLUCOPHAGE) 1000 MG tablet TAKE ONE TABLET BY MOUTH 2 TIMES A DAY WITH A MEAL  . omega-3 acid ethyl esters (LOVAZA) 1 g capsule TAKE 1 CAPSULE (1 G TOTAL) BY MOUTH 2 (TWO) TIMES DAILY.  [name] Lancets 54M MISC   . ONETOUCH VERIO test strip   . pantoprazole (PROTONIX) 40 MG tablet Take 1 tablet (40 mg total) by mouth daily.  . SUMAtriptan (IMITREX) 50 MG tablet TAKE 1 TAB BY MOUTH ONCE AS NEEDED FOR UP TO 1 DOSE FOR MIGRAINE. MAY REPEAT DOSE IN 2HRS IF HEADACHE PERSISTS FOR MAX DOSE 24 HOURS  . SYMBICORT 160-4.5 MCG/ACT inhaler SMARTSIG:2 Puff(s) By Mouth Twice Daily  . tiZANidine (ZANAFLEX) 4 MG tablet Take 1 tablet (4 mg total) by mouth 2 (two) times daily as needed for muscle spasms.  . traMADol (ULTRAM) 50 MG tablet Take by mouth every 6 (six) hours  as needed.   No facility-administered encounter medications on file as of 11/10/2019.    Goals Addressed            This Visit's Progress   . PharmD - Medication Management       CARE PLAN ENTRY (see longitudinal plan of care for additional care plan information)   Current Barriers:  . Chronic Disease Management support, education, and care coordination needs related to T2DM, neuropathy, depression/anxiety, chronic migraines  Pharmacist Clinical Goal(s):  [name] Kitchen Over the next 30 days, patient will work with CM Pharmacist to complete medication review address needs  identified.  Interventions: . Provider and Inter-disciplinary care team collaboration (see longitudinal plan of care) . Perform chart review. Patient seen by [facility] Endocrinology for Initial appointment on 8/9. Note Endocrinologist advised patient to: -Continue Metformin 1000 mg twice daily - Continue Trulicty 3.0 mg weekly on Sunday - Check blood sugars fasting each morning and before bed -- Continue gabapentin 600 mg in the morning and at bedtime. ADD a separate 300 mg capsule (with the 600 mg capsule) at night - total 900 mg at night - Return to clinic in mid September . Call patient today as scheduled to complete medication review. Patient reports that she in the car on her on the way to obtain her first dose of COVID-19 vaccination, but has medication list with her to review medications. o Advise patient will ask care guide to call to reschedule appointment for medication review with her to complete at a time when she can have her medications with her.  Patient Self Care Activities:  . Calls provider office for new concerns or questions   Initial goal documentation and Please see past updates related to this goal by clicking on the "Past Updates" button in the selected goal         Plan  Will ask Care Guide team to reach out to patient to reschedule appointment to complete medication review.  Harlow Asa, PharmD, [place] [employer] [phone]

## 2019-11-10 NOTE — Patient Instructions (Signed)
Thank you allowing the Chronic Care Management Team to be a part of your care! It was a pleasure speaking with you today!     CCM (Chronic Care Management) Team    Noreene Larsson RN, MSN, CCM Nurse Care Coordinator  [phone]   Harlow Asa PharmD  Clinical Pharmacist  [phone]   [name] LCSW Clinical Social Worker [phone]  Visit Information  Goals Addressed            This Visit's Progress   . PharmD - Medication Management       CARE PLAN ENTRY (see longitudinal plan of care for additional care plan information)   Current Barriers:  . Chronic Disease Management support, education, and care coordination needs related to T2DM, neuropathy, depression/anxiety, chronic migraines  Pharmacist Clinical Goal(s):  [name] Kitchen Over the next 30 days, patient will work with CM Pharmacist to complete medication review address needs identified.  Interventions: . Provider and Inter-disciplinary care team collaboration (see longitudinal plan of care) . Perform chart review. Patient seen by [facility] for Initial appointment on 8/9.  [name] Kitchen Call patient today as scheduled to complete medication review. Patient reports that she in the car on her on the way to obtain her first dose of COVID-19 vaccination, but has medication list with her to review medications. o Advise patient will ask care guide to call to reschedule appointment for medication review with her to complete at a time when she can have her medications with her.  Patient Self Care Activities:  . Calls provider office for new concerns or questions   Initial goal documentation and Please see past updates related to this goal by clicking on the "Past Updates" button in the selected goal         Patient verbalizes understanding of instructions provided today.   Will ask Care Guide team to reach out to patient to reschedule appointment to complete medication review.  Harlow Asa, PharmD,  [place] [employer] [phone]

## 2019-11-15 NOTE — Progress Notes (Signed)
Pt has been r/s for 12/08/2019

## 2019-11-18 ENCOUNTER — Other Ambulatory Visit: Payer: Self-pay | Admitting: Family Medicine

## 2019-11-18 DIAGNOSIS — E118 Type 2 diabetes mellitus with unspecified complications: Secondary | ICD-10-CM

## 2019-11-18 DIAGNOSIS — Z794 Long term (current) use of insulin: Secondary | ICD-10-CM

## 2019-11-18 NOTE — Telephone Encounter (Signed)
Requested Prescriptions  Pending Prescriptions Disp Refills  . omega-3 acid ethyl esters (LOVAZA) 1 g capsule [Pharmacy Med Name: OMEGA-3 ETHYL ESTERS 1 GM CAP] 180 capsule 1    Sig: TAKE 1 CAPSULE (1 G TOTAL) BY MOUTH 2 (TWO) TIMES DAILY.     Endocrinology:  Nutritional Agents Passed - 11/18/2019  1:52 AM      Passed - Valid encounter within last 12 months    Recent Outpatient Visits          1 month ago Screening for cervical cancer   [facility], [name], FNP   2 months ago Type 2 diabetes mellitus with complication, with long-term current use of insulin [facility])   [facility], [name], FNP   5 months ago Type 2 diabetes mellitus with complication, with long-term current use of insulin [facility])   [facility], Devonne Doughty, DO   10 months ago Type 2 diabetes mellitus with complication, with long-term current use of insulin [facility])   [facility] [name], NP   11 months ago Excessive bleeding in premenopausal period   [facility] Merrilyn Puma, Jerrel Ivory, NP

## 2019-11-24 ENCOUNTER — Ambulatory Visit: Payer: Self-pay

## 2019-11-24 NOTE — Telephone Encounter (Signed)
Patient called and says she tested positive for COVID using a home test today. She says she was around her sister in law who tested positive. The patient says she started having symptoms 2 days ago-body aches, chills, cough, headache, stuffy nose. She says she doesn't have a thermometer to check her temperature. She says she has COPD and her breathing (SOB) is no worse than it normally is with her COPD. I asked when she's up moving normal healthy, is she SOB, she says yes. I asked when she moves around past 2 days is it worse, she says no. She says she's been in the bed x 2 days and today she sat up in the chair for a little while. She says she's eating and is able to taste and smell. She says she's drinking and staying hydrated. She says she hurts so bad and she's taking gabapentin for the body aches as well her Tylenol every 4 hours. Virtual appointment scheduled for tomorrow, 11/25/14 at 1120 with Cyndia Skeeters, FNP, care advice given, she verbalized understanding.  Reason for Disposition . [1] HIGH RISK patient (e.g., age > 45 years, diabetes, heart or lung disease, weak immune system) AND [2] new or worsening symptoms  Answer Assessment - Initial Assessment Questions 1. COVID-19 DIAGNOSIS: "Who made your Coronavirus (COVID-19) diagnosis?" "Was it confirmed by a positive lab test?" If not diagnosed by a HCP, ask "Are there lots of cases (community spread) where you live?" (See public health department website, if unsure)     Home test positive today 2. COVID-19 EXPOSURE: "Was there any known exposure to COVID before the symptoms began?" CDC Definition of close contact: within 6 feet (2 meters) for a total of 15 minutes or more over a 24-hour period.      Yes-my sister in law  3. ONSET: "When did the COVID-19 symptoms start?"      2 days ago 4. WORST SYMPTOM: "What is your worst symptom?" (e.g., cough, fever, shortness of breath, muscle aches)     Muscle aches, cough 5. COUGH: "Do you have a cough?"  If Yes, ask: "How bad is the cough?"       Yes, real bad 6. FEVER: "Do you have a fever?" If Yes, ask: "What is your temperature, how was it measured, and when did it start?"     No thermometer 7. RESPIRATORY STATUS: "Describe your breathing?" (e.g., shortness of breath, wheezing, unable to speak)      No problems breathing 8. BETTER-SAME-WORSE: "Are you getting better, staying the same or getting worse compared to yesterday?"  If getting worse, ask, "In what way?"     A little better because able to sit up today, been in the bed past 2 days 9. HIGH RISK DISEASE: "Do you have any chronic medical problems?" (e.g., asthma, heart or lung disease, weak immune system, obesity, etc.)     COPD, diabetes 10. PREGNANCY: "Is there any chance you are pregnant?" "When was your last menstrual period?"       N/A 11. OTHER SYMPTOMS: "Do you have any other symptoms?"  (e.g., chills, fatigue, headache, loss of smell or taste, muscle pain, sore throat; new loss of smell or taste especially support the diagnosis of COVID-19)       Headache, stuffy nose, fatigue, chills  Protocols used: CORONAVIRUS (COVID-19) DIAGNOSED OR SUSPECTED-A-AH

## 2019-11-25 ENCOUNTER — Other Ambulatory Visit: Payer: Self-pay | Admitting: Hospice and Palliative Medicine

## 2019-11-25 ENCOUNTER — Other Ambulatory Visit: Payer: Self-pay

## 2019-11-25 ENCOUNTER — Ambulatory Visit: Payer: Self-pay | Admitting: Licensed Clinical Social Worker

## 2019-11-25 ENCOUNTER — Ambulatory Visit (INDEPENDENT_AMBULATORY_CARE_PROVIDER_SITE_OTHER): Payer: Medicare HMO | Admitting: Family Medicine

## 2019-11-25 ENCOUNTER — Encounter: Payer: Self-pay | Admitting: Family Medicine

## 2019-11-25 DIAGNOSIS — E119 Type 2 diabetes mellitus without complications: Secondary | ICD-10-CM

## 2019-11-25 DIAGNOSIS — U071 COVID-19: Secondary | ICD-10-CM | POA: Insufficient documentation

## 2019-11-25 DIAGNOSIS — R0602 Shortness of breath: Secondary | ICD-10-CM | POA: Insufficient documentation

## 2019-11-25 DIAGNOSIS — F331 Major depressive disorder, recurrent, moderate: Secondary | ICD-10-CM | POA: Diagnosis not present

## 2019-11-25 DIAGNOSIS — E118 Type 2 diabetes mellitus with unspecified complications: Secondary | ICD-10-CM | POA: Diagnosis not present

## 2019-11-25 DIAGNOSIS — F329 Major depressive disorder, single episode, unspecified: Secondary | ICD-10-CM | POA: Diagnosis not present

## 2019-11-25 DIAGNOSIS — Z794 Long term (current) use of insulin: Secondary | ICD-10-CM

## 2019-11-25 DIAGNOSIS — F419 Anxiety disorder, unspecified: Secondary | ICD-10-CM | POA: Diagnosis not present

## 2019-11-25 MED ORDER — PREDNISONE 20 MG PO TABS: 40.0000 mg | ORAL_TABLET | Freq: Every day | ORAL | 0 refills | Status: AC

## 2019-11-25 MED ORDER — ALBUTEROL SULFATE HFA 108 (90 BASE) MCG/ACT IN AERS: 1.0000 | INHALATION_SPRAY | Freq: Four times a day (QID) | RESPIRATORY_TRACT | 1 refills | Status: DC | PRN

## 2019-11-25 NOTE — Chronic Care Management (AMB) (Signed)
Chronic Care Management    Clinical Social Work Follow Up Note  11/25/2019 Name: Brandi Calderon MRN: [id number] DOB: 1966/01/02  Brandi Calderon is a 54 y.o. year old female who is a primary care patient of [name], [name], FNP. The CCM team was consulted for assistance with Mental Health Counseling and Resources.   Review of patient status, including review of consultants reports, other relevant assessments, and collaboration with appropriate care team members and the patient's provider was performed as part of comprehensive patient evaluation and provision of chronic care management services.    SDOH (Social Determinants of Health) assessments performed: Yes    Outpatient Encounter Medications as of 11/25/2019  Medication Sig  . Albuterol Sulfate 108 (90 Base) MCG/ACT AEPB Inhale into the lungs. Use 2 puffs 4 times a day prn  . Aspirin-Salicylamide-Caffeine (BC HEADACHE POWDER PO) Take 4 packets by mouth daily as needed (pain).  [name] Kitchen atorvastatin (LIPITOR) 20 MG tablet TAKE 1 TABLET (20 MG TOTAL) BY MOUTH DAILY AT 6 PM.  . buPROPion (WELLBUTRIN XL) 150 MG 24 hr tablet   . cetirizine (ZYRTEC) 10 MG tablet Take by mouth.  . citalopram (CELEXA) 20 MG tablet Take 40 mg by mouth 2 (two) times daily.  (Patient not taking: Reported on 09/21/2019)  . clonazePAM (KLONOPIN) 0.5 MG tablet Take 0.5 mg by mouth 2 (two) times daily as needed for anxiety (Sometimes takes 3 tablets a day).   . cloNIDine (CATAPRES) 0.1 MG tablet Take by mouth.  . diclofenac (VOLTAREN) 75 MG EC tablet Take by mouth. (Patient not taking: Reported on 09/21/2019)  . Dulaglutide (TRULICITY) 3 PY/0.9XI SOPN Inject 3 mg into the skin once a week.  . DULoxetine (CYMBALTA) 30 MG capsule 1 capsule daily.  . fluticasone (FLONASE) 50 MCG/ACT nasal spray Place 2 sprays into both nostrils daily.  . Fluticasone Propionate (FLONASE NA) Place into the nose as needed. Use 2 spray in each nostril prn  . Fluticasone-Umeclidin-Vilant  (TRELEGY ELLIPTA) 100-62.5-25 MCG/INH AEPB Inhale 1 puff into the lungs daily.  . furosemide (LASIX) 40 MG tablet Take by mouth.  . gabapentin (NEURONTIN) 600 MG tablet TAKE 1 TABLET BY MOUTH THREE TIMES A DAY  . hydrOXYzine (VISTARIL) 25 MG capsule Take 25-50 mg by mouth at bedtime as needed.  [name] Kitchen JANUVIA 100 MG tablet Take 100 mg by mouth daily.  . medroxyPROGESTERone (PROVERA) 10 MG tablet Take 1 tablet (10 mg total) by mouth daily.  . metFORMIN (GLUCOPHAGE) 1000 MG tablet TAKE ONE TABLET BY MOUTH 2 TIMES A DAY WITH A MEAL  . omega-3 acid ethyl esters (LOVAZA) 1 g capsule TAKE 1 CAPSULE (1 G TOTAL) BY MOUTH 2 (TWO) TIMES DAILY.  [name] Lancets 33A MISC   . ONETOUCH VERIO test strip   . pantoprazole (PROTONIX) 40 MG tablet Take 1 tablet (40 mg total) by mouth daily.  . SUMAtriptan (IMITREX) 50 MG tablet TAKE 1 TAB BY MOUTH ONCE AS NEEDED FOR UP TO 1 DOSE FOR MIGRAINE. MAY REPEAT DOSE IN 2HRS IF HEADACHE PERSISTS FOR MAX DOSE 24 HOURS  . SYMBICORT 160-4.5 MCG/ACT inhaler SMARTSIG:2 Puff(s) By Mouth Twice Daily  . tiZANidine (ZANAFLEX) 4 MG tablet Take 1 tablet (4 mg total) by mouth 2 (two) times daily as needed for muscle spasms.  . traMADol (ULTRAM) 50 MG tablet Take by mouth every 6 (six) hours as needed.   No facility-administered encounter medications on file as of 11/25/2019.     Goals Addressed    .  SW- "I tested positive for COVID" (pt-stated)        Current Barriers:  . Chronic Mental Health needs related to Depression, Anxiety and Grief . Limited social support . Mental Health Concerns  . Social Isolation . Loss of child in 2016 . Suicidal Ideation/Homicidal Ideation: No  Clinical Social Work Goal(s):  [name] Kitchen Over the next 120 days, patient will work with SW to address concerns related to care coordination needs and lack of education/support/resource connection. LCSW will assist patient in gaining community resource education and additional support and resource connection  as well in order to maintain health and mental health appropriately  . Over the next 120 days, patient will demonstrate improved adherence to self care as evidenced by implementing healthy self-care into her daily routine such as: attending all medical appointments, deep breathing exercises, taking time for self-reflection, taking medications as prescribed, drinking water and daily exercise to improve mobility and mood.  . Over the next 120 days, patient will work with SW bi-monthly by telephone or in person to reduce or manage symptoms related to stress, anxiety and grief. . Over the next 120 days, patient will demonstrate improved health management independence as evidenced by implementing healthy self-care skills and positive support/resources into her daily routine to help cope with stressors and improve overall health and well-being  . Over the next 120 days, patient or caregiver will verbalize basic understanding of depression/stress process and self health management plan as evidenced by her participation in development of long term plan of care and institution of self health management strategies  Interventions: . Patient interviewed and appropriate assessments performed: brief mental health assessment . Provided mental health counseling with regard to coping with current COVID diagnosis. Patient reports that she has never been in this much discomfort and is hopeful that her symptoms will alleviate soon. Emotional support and coping skill education provided to patient for this specific concern. . Patient wishes to discuss grief support resources on a different day as she is sick and does not wish to process this information at this time.  . Patient reports that this is her 4th day of experiencing COVID symptoms and she is wondering when she can be able to find some relief. Patient has virtual PCP visit today on 11/25/19 for this concern. Patient reports that both of her daughters dropped off  groceries for her. She reports that she is currently taking ibuprofen to help cope with her pain. . Provided patient with information about healthy self-care that she can implement into her daily routine to combat COVID related symptoms as well as to improve her health/mood.  . Discussed plans with patient for ongoing care management follow up and provided patient with direct contact information for care management team . Advised patient to contact CCM LCSW for any urgent social work related issues. . Assisted patient/caregiver with obtaining information about health plan benefits . Provided education and assistance to client regarding Advanced Directives. . Provided education to patient/caregiver about Hospice and/or Palliative Care services . Encouraged patient to consider a mental health provider for long term follow up and therapy/counseling for grief support. . Brief CBT provided as patient is dealing with multiple stressors with COVID diagnosis. Patient was educated on ways to combat her negative thinking patterns.  Patient Self Care Activities:  . Self administers medications as prescribed . Attends all scheduled provider appointments . Calls provider office for new concerns or questions . Ability for insight . Independent living . Motivation for treatment . Strong  family or social support  Patient Coping Strengths:  . Supportive Relationships . Spirituality . Hopefulness . Self Advocate . Able to Communicate Effectively  Patient Self Care Deficits:  . Lacks social connections  Initial goal documentation      Follow Up Plan: SW will follow up with patient by phone over the next 30-60 days  [name], [employer], MSW, [email or web address] Phone: [phone]

## 2019-11-25 NOTE — Assessment & Plan Note (Signed)
See COVID19 A/P

## 2019-11-25 NOTE — Progress Notes (Signed)
I connected by phone with  Brandi Calderon on 11/25/2019 to discuss the potential use of an new treatment for mild to moderate COVID-19 viral infection in non-hospitalized patients.   This patient is a age/sex that meets the FDA criteria for Emergency Use Authorization of casirivimab\imdevimab.  Has a (+) direct SARS-CoV-2 viral test result 1. Has mild or moderate COVID-19  2. Is ? 52 years of age and weighs ? 40 kg 3. Is NOT hospitalized due to COVID-19 4. Is NOT requiring oxygen therapy or requiring an increase in baseline oxygen flow rate due to COVID-19 5. Is within 10 days of symptom onset 6. Has at least one of the high risk factor(s) for progression to severe COVID-19 and/or hospitalization as defined in EUA. ? Specific high risk criteria : DM, COPD   Symptom onset: 11/22/19   I have spoken and communicated the following to the patient or parent/caregiver:   1. FDA has authorized the emergency use of casirivimab\imdevimab for the treatment of mild to moderate COVID-19 in adults and pediatric patients with positive results of direct SARS-CoV-2 viral testing who are 52 years of age and older weighing at least 40 kg, and who are at high risk for progressing to severe COVID-19 and/or hospitalization.   2. The significant known and potential risks and benefits of casirivimab\imdevimab, and the extent to which such potential risks and benefits are unknown.   3. Information on available alternative treatments and the risks and benefits of those alternatives, including clinical trials.   4. Patients treated with casirivimab\imdevimab should continue to self-isolate and use infection control measures (e.g., wear mask, isolate, social distance, avoid sharing personal items, clean and disinfect "high touch" surfaces, and frequent handwashing) according to CDC guidelines.    5. The patient or parent/caregiver has the option to accept or refuse casirivimab\imdevimab .   After reviewing this  information with the patient, The patient agreed to proceed with receiving casirivimab\imdevimab infusion and will be provided a copy of the Fact sheet prior to receiving the infusion.Altha Harm, PhD, NP-C [phone] ([place])

## 2019-11-25 NOTE — Progress Notes (Signed)
Virtual Visit via Telephone  The purpose of this virtual visit is to provide medical care while limiting exposure to the novel coronavirus (COVID19) for both patient and office staff.  Consent was obtained for phone visit:  Yes.   Answered questions that patient had about telehealth interaction:  Yes.   I discussed the limitations, risks, security and privacy concerns of performing an evaluation and management service by telephone. I also discussed with the patient that there may be a patient responsible charge related to this service. The patient expressed understanding and agreed to proceed.  Patient is at home and is accessed via telephone Services are provided by Harlin Rain, FNP-C from [facility])  ---------------------------------------------------------------------- Chief Complaint  Patient presents with  . COVID    diagnose Positive Home Covid test on yesterday. Pt exposed to COVID by a family member. Coughing, bodyaches, chills, chest discomfort that she associate with all the coughing and possible fever.  Pt currently Tylenol 1000 MG every four hours. Ibuprofen 600 MG once today.     S: Reviewed CMA documentation. I have called patient and gathered additional HPI as follows:  Brandi Calderon reports she has been having symptoms of shortness of breath, chills/shakes, diaphoresis, body aches, unknown if has a fever, coughing and fatigue that has kept her in bed for the past 2-3 days.  Has taken a home COVID test and that was positive yesterday.  Has had COVID exposure from a family member.  Has been taking ibuprofen 600mg  once today and acetaminophen 1000mg  every 4 hours.  Has been taking zarbees cough syrup and that has been helping, tolerating gatorade and crackers.  Patient is currently home in isolation Denies any high risk travel to areas of current concern for COVID19.  Past Medical History:  Diagnosis Date  . Anxiety   . Arthritis   . Asthma    . Back pain   . COPD (chronic obstructive pulmonary disease) ([place])   . Depression   . Diabetes mellitus   . Diabetes mellitus without complication ([place])   . Headache   . Hyperlipidemia   . Hypertension   . Tendonitis    Social History   Tobacco Use  . Smoking status: Current Every Day Smoker    Packs/day: 3.00    Years: 33.00    Pack years: 99.00    Types: Cigarettes  . Smokeless tobacco: Never Used  Vaping Use  . Vaping Use: Never used  Substance Use Topics  . Alcohol use: No  . Drug use: Yes    Types: Marijuana    Comment: States she smokes MJ when she does not have any Tramadol.    Current Outpatient Medications:  .  Albuterol Sulfate 108 (90 Base) MCG/ACT AEPB, Inhale into the lungs. Use 2 puffs 4 times a day prn, Disp: , Rfl:  .  atorvastatin (LIPITOR) 20 MG tablet, TAKE 1 TABLET (20 MG TOTAL) BY MOUTH DAILY AT 6 PM., Disp: 90 tablet, Rfl: 1 .  buPROPion (WELLBUTRIN XL) 150 MG 24 hr tablet, , Disp: , Rfl:  .  clonazePAM (KLONOPIN) 0.5 MG tablet, Take 0.5 mg by mouth 2 (two) times daily as needed for anxiety (Sometimes takes 3 tablets a day). , Disp: , Rfl:  .  Dulaglutide (TRULICITY) 3 DJ/2.4QA SOPN, Inject 3 mg into the skin once a week., Disp: 4 pen, Rfl: 3 .  fluticasone (FLONASE) 50 MCG/ACT nasal spray, Place 2 sprays into both nostrils daily., Disp: , Rfl:  .  Fluticasone-Umeclidin-Vilant (TRELEGY ELLIPTA) 100-62.5-25 MCG/INH AEPB, Inhale 1 puff into the lungs daily., Disp: 30 each, Rfl: 5 .  furosemide (LASIX) 40 MG tablet, Take by mouth., Disp: , Rfl:  .  gabapentin (NEURONTIN) 300 MG capsule, Take 300 mg capsule at bedtime, Disp: , Rfl:  .  gabapentin (NEURONTIN) 600 MG tablet, TAKE 1 TABLET BY MOUTH THREE TIMES A DAY, Disp: 270 tablet, Rfl: 0 .  hydrOXYzine (VISTARIL) 25 MG capsule, Take 25-50 mg by mouth at bedtime as needed., Disp: , Rfl:  .  metFORMIN (GLUCOPHAGE) 1000 MG tablet, TAKE ONE TABLET BY MOUTH 2 TIMES A DAY WITH A MEAL, Disp: 180 tablet, Rfl: 1 .   omega-3 acid ethyl esters (LOVAZA) 1 g capsule, TAKE 1 CAPSULE (1 G TOTAL) BY MOUTH 2 (TWO) TIMES DAILY., Disp: 180 capsule, Rfl: 3 .  OneTouch Delica Lancets 63O MISC, , Disp: , Rfl:  .  ONETOUCH VERIO test strip, , Disp: , Rfl:  .  pantoprazole (PROTONIX) 40 MG tablet, Take 1 tablet (40 mg total) by mouth daily., Disp: 90 tablet, Rfl: 1 .  SUMAtriptan (IMITREX) 50 MG tablet, TAKE 1 TAB BY MOUTH ONCE AS NEEDED FOR UP TO 1 DOSE FOR MIGRAINE. MAY REPEAT DOSE IN 2HRS IF HEADACHE PERSISTS FOR MAX DOSE 24 HOURS, Disp: 9 tablet, Rfl: 2 .  SYMBICORT 160-4.5 MCG/ACT inhaler, SMARTSIG:2 Puff(s) By Mouth Twice Daily, Disp: , Rfl:  .  tiZANidine (ZANAFLEX) 4 MG tablet, Take 1 tablet (4 mg total) by mouth 2 (two) times daily as needed for muscle spasms., Disp: 30 tablet, Rfl: 1 .  traMADol (ULTRAM) 50 MG tablet, Take by mouth every 6 (six) hours as needed., Disp: , Rfl:  .  albuterol (PROAIR HFA) 108 (90 Base) MCG/ACT inhaler, Inhale 1-2 puffs into the lungs every 6 (six) hours as needed for wheezing or shortness of breath., Disp: 6.7 g, Rfl: 1 .  Aspirin-Salicylamide-Caffeine (BC HEADACHE POWDER PO), Take 4 packets by mouth daily as needed (pain). (Patient not taking: Reported on 11/25/2019), Disp: , Rfl:  .  diclofenac (VOLTAREN) 75 MG EC tablet, Take by mouth. (Patient not taking: Reported on 09/21/2019), Disp: , Rfl:  .  DULoxetine (CYMBALTA) 30 MG capsule, 1 capsule daily. (Patient not taking: Reported on 11/25/2019), Disp: , Rfl:  .  medroxyPROGESTERone (PROVERA) 10 MG tablet, Take 1 tablet (10 mg total) by mouth daily. (Patient not taking: Reported on 11/25/2019), Disp: 30 tablet, Rfl: 11 .  predniSONE (DELTASONE) 20 MG tablet, Take 2 tablets (40 mg total) by mouth daily with breakfast for 5 days., Disp: 10 tablet, Rfl: 0  Depression screen [facility] 2/9 11/04/2019 09/07/2019 09/07/2019  Decreased Interest 1 2 0  Down, Depressed, Hopeless 1 2 0  PHQ - 2 Score 2 4 0  Altered sleeping 3 3 -  Tired, decreased energy 3  3 -  Change in appetite 2 3 -  Feeling bad or failure about yourself  2 2 -  Trouble concentrating 3 3 -  Moving slowly or fidgety/restless 3 3 -  Suicidal thoughts 0 0 -  PHQ-9 Score 18 21 -  Difficult doing work/chores Not difficult at all Not difficult at all -    GAD 7 : Generalized Anxiety Score 09/07/2019 09/07/2019 06/02/2019  Nervous, Anxious, on Edge 3 3 3   Control/stop worrying 2 2 3   Worry too much - different things 2 2 3   Trouble relaxing 3 3 3   Restless 3 3 3   Easily annoyed or irritable 0 0 3  Afraid - awful might happen 0 0 0  Total GAD 7 Score 13 13 18   Anxiety Difficulty Not difficult at all Not difficult at all Very difficult    -------------------------------------------------------------------------- O: No physical exam performed due to remote telephone encounter.  Physical Exam: Patient remotely monitored without video.  Verbal communication appropriate.  Cognition normal.  Recent Results (from the past 2160 hour(s))  POCT glycosylated hemoglobin (Hb A1C)     Status: Abnormal   Collection Time: 09/07/19  9:43 AM  Result Value Ref Range   Hemoglobin A1C 7.7 (A) 4.0 - 5.6 %   HbA1c POC (<> result, manual entry) 7.7 4.0 - 5.6 %   HbA1c, POC (prediabetic range)     HbA1c, POC (controlled diabetic range) 7.7 (A) 0.0 - 7.0 %  POCT Glucose (CBG)     Status: Abnormal   Collection Time: 09/07/19  9:44 AM  Result Value Ref Range   POC Glucose 159 (A) 70 - 99 mg/dl  CBC with Differential     Status: None   Collection Time: 09/07/19 10:19 AM  Result Value Ref Range   WBC 10.6 3.8 - 10.8 Thousand/uL   RBC 4.72 3.80 - 5.10 Million/uL   Hemoglobin 14.0 11.7 - 15.5 g/dL   HCT 43.1 35 - 45 %   MCV 91.3 80.0 - 100.0 fL   MCH 29.7 27.0 - 33.0 pg   MCHC 32.5 32.0 - 36.0 g/dL   RDW 13.9 11.0 - 15.0 %   Platelets 290 140 - 400 Thousand/uL   MPV 9.2 7.5 - 12.5 fL   Neutro Abs 7,367 1,500 - 7,800 cells/uL   Lymphs Abs 2,523 850 - 3,900 cells/uL   Absolute Monocytes 530  200 - 950 cells/uL   Eosinophils Absolute 127 15 - 500 cells/uL   Basophils Absolute 53 0 - 200 cells/uL   Neutrophils Relative % 69.5 %   Total Lymphocyte 23.8 %   Monocytes Relative 5.0 %   Eosinophils Relative 1.2 %   Basophils Relative 0.5 %  COMPLETE METABOLIC PANEL WITH GFR     Status: Abnormal   Collection Time: 09/07/19 10:19 AM  Result Value Ref Range   Glucose, Bld 129 (H) 65 - 99 mg/dL    Comment: .            Fasting reference interval . For someone without known diabetes, a glucose value >125 mg/dL indicates that they may have diabetes and this should be confirmed with a follow-up test. .    BUN 16 7 - 25 mg/dL   Creat 0.81 0.50 - 1.05 mg/dL    Comment: For patients >46 years of age, the reference limit for Creatinine is approximately 13% higher for people identified as African-American. .    GFR, Est Non African American 84 > OR = 60 mL/min/1.35m2   GFR, Est African American 97 > OR = 60 mL/min/1.30m2   BUN/Creatinine Ratio NOT APPLICABLE 6 - 22 (calc)   Sodium 141 135 - 146 mmol/L   Potassium 4.7 3.5 - 5.3 mmol/L   Chloride 104 98 - 110 mmol/L   CO2 27 20 - 32 mmol/L   Calcium 9.8 8.6 - 10.4 mg/dL   Total Protein 7.2 6.1 - 8.1 g/dL   Albumin 4.6 3.6 - 5.1 g/dL   Globulin 2.6 1.9 - 3.7 g/dL (calc)   AG Ratio 1.8 1.0 - 2.5 (calc)   Total Bilirubin 0.4 0.2 - 1.2 mg/dL   Alkaline phosphatase (APISO) 105 37 - 153 U/L  AST 10 10 - 35 U/L   ALT 9 6 - 29 U/L  HIV Antibody (routine testing w rflx)     Status: None   Collection Time: 09/07/19 10:19 AM  Result Value Ref Range   HIV 1&2 Ab, 4th Generation NON-REACTIVE NON-REACTI    Comment: HIV-1 antigen and HIV-1/HIV-2 antibodies were not detected. There is no laboratory evidence of HIV infection. [name] Kitchen PLEASE NOTE: This information has been disclosed to you from records whose confidentiality may be protected by state law.  If your state requires such protection, then the state law prohibits you from making  any further disclosure of the information without the specific written consent of the person to whom it pertains, or as otherwise permitted by law. A general authorization for the release of medical or other information is NOT sufficient for this purpose. . For additional information please refer to http://education.questdiagn[employer].com/faq/FAQ106 (This link is being provided for informational/ educational purposes only.) . [name] Kitchen The performance of this assay has not been clinically validated in patients less than 55 years old. [name] Kitchen   POCT Urinalysis Dipstick     Status: Abnormal   Collection Time: 09/07/19 11:02 AM  Result Value Ref Range   Color, UA Yellow    Clarity, UA clear    Glucose, UA Positive (A) Negative    Comment: 250   Bilirubin, UA negative    Ketones, UA negative    Spec Grav, UA 1.015 1.010 - 1.025   Blood, UA trace    pH, UA 5.0 5.0 - 8.0   Protein, UA Negative Negative   Urobilinogen, UA 0.2 0.2 or 1.0 E.U./dL   Nitrite, UA positive    Leukocytes, UA Negative Negative   Appearance     Odor    POCT UA - Microalbumin     Status: Abnormal   Collection Time: 09/07/19 11:03 AM  Result Value Ref Range   Microalbumin Ur, POC 20 mg/L   Creatinine, POC     Albumin/Creatinine Ratio, Urine, POC    Urine Culture     Status: Abnormal   Collection Time: 09/07/19 11:39 AM   Specimen: Urine  Result Value Ref Range   MICRO NUMBER: [id number]    SPECIMEN QUALITY: Adequate    Sample Source URINE, CLEAN CATCH    STATUS: FINAL    ISOLATE 1: ESBL Escherichia coli (A)     Comment: Greater than 100,000 CFU/mL of Escherichia coli (ESBL) ESBL RESULT:        The organism has been confirmed as an ESBL producer.      Susceptibility   Esbl escherichia coli - URINE CULTURE, REFLEX    AMOX/CLAVULANIC 8 Sensitive     AMPICILLIN* >=32 Resistant      * Extended spectrum beta-lactamase (ESBL) producingorganisms demonstrate decreased activity withpenicillins, cephalosporins and aztreonam.     AMPICILLIN/SULBACTAM 16 Intermediate     CEFAZOLIN* >=64 Resistant      * Extended spectrum beta-lactamase (ESBL) producingorganisms demonstrate decreased activity withpenicillins, cephalosporins and aztreonam.For uncomplicated UTI caused by E. coli,K. pneumoniae or P. mirabilis: Cefazolin issusceptible if MIC <32 mcg/mL and predictssusceptible to the oral agents cefaclor, cefdinir,cefpodoxime, cefprozil, cefuroxime, cephalexinand loracarbef.    CEFEPIME 2 Resistant     CEFTRIAXONE 32 Resistant     CIPROFLOXACIN >=4 Resistant     LEVOFLOXACIN >=8 Resistant     ERTAPENEM <=0.5 Sensitive     GENTAMICIN <=1 Sensitive     IMIPENEM <=0.25 Sensitive     NITROFURANTOIN <=16 Sensitive  PIP/TAZO 32 Intermediate     TOBRAMYCIN <=1 Sensitive     TRIMETH/SULFA* >=320 Resistant      * Extended spectrum beta-lactamase (ESBL) producingorganisms demonstrate decreased activity withpenicillins, cephalosporins and aztreonam.For uncomplicated UTI caused by E. coli,K. pneumoniae or P. mirabilis: Cefazolin issusceptible if MIC <32 mcg/mL and predictssusceptible to the oral agents cefaclor, cefdinir,cefpodoxime, cefprozil, cefuroxime, cephalexinand loracarbef.Legend:S = Susceptible  I = IntermediateR = Resistant  NS = Not susceptible* = Not tested  NR = Not reported**NN = See antimicrobic comments  Cytology - PAP     Status: None   Collection Time: 09/21/19  3:40 PM  Result Value Ref Range   High risk HPV Negative    Adequacy      Satisfactory for evaluation; transformation zone component ABSENT.   Diagnosis      - Negative for intraepithelial lesion or malignancy (NILM)   Comment Normal Reference Range HPV - Negative     -------------------------------------------------------------------------- A&P:  Problem List Items Addressed This Visit      Other   COVID-19 - Primary    + COVID 19 home test yesterday.  Reports symptoms x 4 days.  Given hx of T2DM would be considered high risk and discussed  option for COVID 19 antibody infusion.  Patient is interested in this.  Discussed can send in prescription for albuterol inhaler to use as directed and prednisone 40mg  to take daily for the next 5 days.  Patient has been taking 1000mg  of acetaminophen every 4 hours, discussed is not to take more than 6 of the 500mg  tablets in 24 hours, so that would equate to 2 tablets every 8 hours, patient verbalized understanding.  Plan: 1. Begin prednisone 40mg  daily x 5 days 2. Begin albuterol 1-2 puffs every 4-6 hours as needed for shortness of breath, cough, and/or wheezing 3. Contact made with COVID19 antibody infusion clinic and notified that APP will be in contact with patient 4. If symptoms worsen, has increased shortness of breath, chest pain, fever > 104, or impending sense of doom to proceed to the emergency room immediately.      Relevant Medications   predniSONE (DELTASONE) 20 MG tablet   albuterol (PROAIR HFA) 108 (90 Base) MCG/ACT inhaler   Shortness of breath    See COVID19 A/P      Relevant Medications   predniSONE (DELTASONE) 20 MG tablet   albuterol (PROAIR HFA) 108 (90 Base) MCG/ACT inhaler      Meds ordered this encounter  Medications  . predniSONE (DELTASONE) 20 MG tablet    Sig: Take 2 tablets (40 mg total) by mouth daily with breakfast for 5 days.    Dispense:  10 tablet    Refill:  0  . albuterol (PROAIR HFA) 108 (90 Base) MCG/ACT inhaler    Sig: Inhale 1-2 puffs into the lungs every 6 (six) hours as needed for wheezing or shortness of breath.    Dispense:  6.7 g    Refill:  1    Follow-up: - APP from COVID 19 antibody infusion clinic to contact and schedule - If symptoms worsen, has increased shortness of breath, chest pain, fever > 104, or impending sense of doom to proceed to the emergency room immediately.  Patient verbalizes understanding with the above medical recommendations including the limitation of remote medical advice.  Specific follow-up and call-back  criteria were given for patient to follow-up or seek medical care more urgently if needed.  - Time spent in direct consultation with patient on phone: 9  minutes  Harlin Rain, FNP-C [facility] 11/25/2019, 11:50 AM

## 2019-11-25 NOTE — Patient Instructions (Addendum)
I have sent in a prescription for Prednisone to take 40mg  daily for the next 5 days.  I have sent in a prescription for an albuterol inhaler to take 1-2 puffs every 4-6 hours as needed for shortness of breath, cough, and/or wheezing.  As we discussed, to not take more than 3000mg  (6 tablets of 500mg ) of acetaminophen in 24 hours.    Can take over the counter acetaminophen and/or ibuprofen, according to the packaging directions.  I have contacted the COVID 19 antibody infusion clinic and they will have an advanced practice provider reach out to you regarding scheduling.  If symptoms worsen, has increased shortness of breath, chest pain, fever > 104, or impending sense of doom to proceed to the emergency room immediately.  We will plan to see you back if your symptoms worsen or fail to improve  You will receive a survey after today's visit either digitally by e-mail or paper by USPS mail. Your experiences and feedback matter to [place].  Please respond so we know how we are doing as we provide care for you.  Call us with any questions/concerns/needs.  It is my goal to be available to you for your health concerns.  Thanks for choosing me to be a partner in your healthcare needs!  Harlin Rain, FNP-C Family Nurse Practitioner [facility] Phone: [phone]

## 2019-11-25 NOTE — Assessment & Plan Note (Addendum)
+   COVID 19 home test yesterday.  Reports symptoms x 4 days.  Given hx of T2DM would be considered high risk and discussed option for COVID 19 antibody infusion.  Patient is interested in this.  Discussed can send in prescription for albuterol inhaler to use as directed and prednisone 40mg  to take daily for the next 5 days.  Patient has been taking 1000mg  of acetaminophen every 4 hours, discussed is not to take more than 6 of the 500mg  tablets in 24 hours, so that would equate to 2 tablets every 8 hours, patient verbalized understanding.  Plan: 1. Begin prednisone 40mg  daily x 5 days 2. Begin albuterol 1-2 puffs every 4-6 hours as needed for shortness of breath, cough, and/or wheezing 3. Contact made with COVID19 antibody infusion clinic and notified that APP will be in contact with patient 4. If symptoms worsen, has increased shortness of breath, chest pain, fever > 104, or impending sense of doom to proceed to the emergency room immediately.

## 2019-11-26 ENCOUNTER — Ambulatory Visit (HOSPITAL_COMMUNITY)
Admission: RE | Admit: 2019-11-26 | Discharge: 2019-11-26 | Disposition: A | Discharge status: Home / Self Care | Payer: Medicare Other | Source: Ambulatory Visit | Attending: Pulmonary Disease | Admitting: Pulmonary Disease

## 2019-11-26 DIAGNOSIS — U071 COVID-19: Secondary | ICD-10-CM | POA: Diagnosis present

## 2019-11-26 DIAGNOSIS — Z23 Encounter for immunization: Secondary | ICD-10-CM | POA: Insufficient documentation

## 2019-11-26 MED ORDER — EPINEPHRINE 0.3 MG/0.3ML IJ SOAJ: 0.3000 mg | Freq: Once | INTRAMUSCULAR | Status: DC | PRN

## 2019-11-26 MED ORDER — FAMOTIDINE IN NACL 20-0.9 MG/50ML-% IV SOLN: 20.0000 mg | Freq: Once | INTRAVENOUS | Status: DC | PRN

## 2019-11-26 MED ORDER — ALBUTEROL SULFATE HFA 108 (90 BASE) MCG/ACT IN AERS: 2.0000 | INHALATION_SPRAY | Freq: Once | RESPIRATORY_TRACT | Status: DC | PRN

## 2019-11-26 MED ORDER — SODIUM CHLORIDE 0.9 % IV SOLN
1200.0000 mg | Freq: Once | INTRAVENOUS | Status: AC
  Administered 2019-11-26: 1200 mg via INTRAVENOUS
  Filled 2019-11-26: qty 10

## 2019-11-26 MED ORDER — SODIUM CHLORIDE 0.9 % IV SOLN: INTRAVENOUS | Status: DC | PRN

## 2019-11-26 MED ORDER — DIPHENHYDRAMINE HCL 50 MG/ML IJ SOLN: 50.0000 mg | Freq: Once | INTRAMUSCULAR | Status: DC | PRN

## 2019-11-26 MED ORDER — METHYLPREDNISOLONE SODIUM SUCC 125 MG IJ SOLR: 125.0000 mg | Freq: Once | INTRAMUSCULAR | Status: DC | PRN

## 2019-11-26 NOTE — Discharge Instructions (Signed)

## 2019-11-26 NOTE — Progress Notes (Signed)
  Diagnosis: COVID-19  Physician: Dr. Wright  Procedure: Covid Infusion Clinic Med: casirivimab\imdevimab infusion - Provided patient with casirivimab\imdevimab fact sheet for patients, parents and caregivers prior to infusion.  Complications: No immediate complications noted.  Discharge: Discharged home   Ardath Lepak R Katrinia Straker 11/26/2019   

## 2019-12-08 ENCOUNTER — Telehealth: Payer: Medicare HMO

## 2019-12-08 ENCOUNTER — Other Ambulatory Visit: Payer: Self-pay | Admitting: Family Medicine

## 2019-12-08 DIAGNOSIS — E118 Type 2 diabetes mellitus with unspecified complications: Secondary | ICD-10-CM

## 2019-12-08 NOTE — Telephone Encounter (Signed)
Requested Prescriptions  Pending Prescriptions Disp Refills  . TRULICITY 3 VZ/4.8OL SOPN [Pharmacy Med Name: TRULICITY 3 MB/8.6 ML PEN] 2 mL 3    Sig: INJECT 3 MG INTO THE SKIN ONCE A WEEK.     Endocrinology:  Diabetes - GLP-1 Receptor Agonists Passed - 12/08/2019  2:07 PM      Passed - HBA1C is between 0 and 7.9 and within 180 days    Hemoglobin A1C  Date Value Ref Range Status  09/07/2019 7.7 (A) 4.0 - 5.6 % Final  04/05/2015 11.6  Final   HbA1c, POC (controlled diabetic range)  Date Value Ref Range Status  09/07/2019 7.7 (A) 0.0 - 7.0 % Final   HbA1c POC (<> result, manual entry)  Date Value Ref Range Status  09/07/2019 7.7 4.0 - 5.6 % Final         Passed - Valid encounter within last 6 months    Recent Outpatient Visits          1 week ago [facility], [name], FNP   2 months ago Screening for cervical cancer   [facility], [name], FNP   3 months ago Type 2 diabetes mellitus with complication, with long-term current use of insulin [facility])   [facility], [name], FNP   6 months ago Type 2 diabetes mellitus with complication, with long-term current use of insulin [facility])   [facility], Devonne Doughty, DO   11 months ago Type 2 diabetes mellitus with complication, with long-term current use of insulin [facility])   [facility] Merrilyn Puma, Jerrel Ivory, NP

## 2019-12-16 ENCOUNTER — Ambulatory Visit: Payer: Medicaid Other | Admitting: Licensed Clinical Social Worker

## 2019-12-16 NOTE — Chronic Care Management (AMB) (Signed)
Care Management   Follow Up Note   12/16/2019 Name: Brandi Calderon MRN: [id number] DOB: [birth year]  Referred by: [name], FNP Reason for referral : [place] is a 54 y.o. year old female who is a primary care patient of [name], FNP. The care management team was consulted for assistance with care management and care coordination needs.    Review of patient status, including review of consultants reports, relevant laboratory and other test results, and collaboration with appropriate care team members and the patient's provider was performed as part of comprehensive patient evaluation and provision of chronic care management services.    SDOH (Social Determinants of Health) assessments performed: Yes See Care Plan activities for detailed interventions related to [name])     Advanced Directives: See Care Plan and Vynca application for related entries.   Goals Addressed    .  SW- "I tested positive for COVID" (pt-stated)        Current Barriers:  . Chronic Mental Health needs related to Depression, Anxiety and Grief . Limited social support . Mental Health Concerns  . Social Isolation . Loss of child in 2016 . Suicidal Ideation/Homicidal Ideation: No  Clinical Social Work Goal(s):  [name] Kitchen Over the next 120 days, patient will work with SW to address concerns related to care coordination needs and lack of education/support/resource connection. LCSW will assist patient in gaining community resource education and additional support and resource connection as well in order to maintain health and mental health appropriately  . Over the next 120 days, patient will demonstrate improved adherence to self care as evidenced by implementing healthy self-care into her daily routine such as: attending all medical appointments, deep breathing exercises, taking time for self-reflection, taking medications as prescribed, drinking water and daily exercise to  improve mobility and mood.  . Over the next 120 days, patient will work with SW bi-monthly by telephone or in person to reduce or manage symptoms related to stress, anxiety and grief. . Over the next 120 days, patient will demonstrate improved health management independence as evidenced by implementing healthy self-care skills and positive support/resources into her daily routine to help cope with stressors and improve overall health and well-being  . Over the next 120 days, patient or caregiver will verbalize basic understanding of depression/stress process and self health management plan as evidenced by her participation in development of long term plan of care and institution of self health management strategies  Interventions: . Patient interviewed and appropriate assessments performed: brief mental health assessment . Provided mental health counseling with regard to coping with current COVID diagnosis. Patient reports that she has never been in this much discomfort and is hopeful that her symptoms will alleviate soon. Emotional support and coping skill education provided to patient for this specific concern. UPDATE- Patient reports that she is still experiencing some slight side effects from [place]. She shares that she continues to cough but overall feels better.  . Patient wishes to discuss grief support resources on a different day as she is sick and does not wish to process this information at this time. UPDATE- Patient reports that she is unsure if she wishes to gain grief therapy at this time. Patient was educated on available grief support resources within her area and LCSW asked her to consider this resource implementation over the next few weeks. Patient agreeable to this plan.  . Provided patient with information about healthy self-care that she can implement into  her daily routine to combat COVID related symptoms as well as to improve her health/mood.  . Discussed plans with patient for  ongoing care management follow up and provided patient with direct contact information for care management team . Advised patient to contact CCM LCSW for any urgent social work related issues. . Assisted patient/caregiver with obtaining information about health plan benefits . Provided education and assistance to client regarding Advanced Directives. . Provided education to patient/caregiver about Hospice and/or Palliative Care services . Encouraged patient to consider a mental health provider for long term follow up and therapy/counseling for grief support. . Brief CBT provided as patient is dealing with multiple stressors with COVID diagnosis. Patient was educated on ways to combat her negative thinking patterns.  Patient Self Care Activities:  . Self administers medications as prescribed . Attends all scheduled provider appointments . Calls provider office for new concerns or questions . Ability for insight . Independent living . Motivation for treatment . Strong family or social support  Patient Coping Strengths:  . Supportive Relationships . Spirituality . Hopefulness . Self Advocate . Able to Communicate Effectively  Patient Self Care Deficits:  . Lacks social connections  Initial goal documentation      For information about COVID-19 or "Corona Virus", the following web resources may be helpful:  CDC: BeginnerSteps.be    [place]:  InsuranceIntern.se  Depression screen [facility] 2/9 11/04/2019 09/07/2019 09/07/2019 06/02/2019 01/04/2019  Decreased Interest 1 2 0 3 0  Down, Depressed, Hopeless 1 2 0 2 0  PHQ - 2 Score 2 4 0 5 0  Altered sleeping 3 3 - 3 3  Tired, decreased energy 3 3 - 3 1  Change in appetite 2 3 - 3 1  Feeling bad or failure about yourself  2 2 - 2 0  Trouble concentrating 3 3 - 3 0  Moving slowly or fidgety/restless 3 3 - 3 0    Suicidal thoughts 0 0 - 0 0  PHQ-9 Score 18 21 - 22 5  Difficult doing work/chores Not difficult at all Not difficult at all - Very difficult Somewhat difficult   The care management team will reach out to the patient again over the next 30-60 days.   [name], BSW, MSW, [email or web address] Phone: [phone]

## 2019-12-19 ENCOUNTER — Other Ambulatory Visit: Payer: Self-pay | Admitting: Family Medicine

## 2019-12-19 DIAGNOSIS — E118 Type 2 diabetes mellitus with unspecified complications: Secondary | ICD-10-CM

## 2019-12-19 NOTE — Telephone Encounter (Signed)
30 day courtesy RF   Requested Prescriptions  Pending Prescriptions Disp Refills   atorvastatin (LIPITOR) 20 MG tablet [Pharmacy Med Name: ATORVASTATIN 20 MG TABLET] 30 tablet 1    Sig: TAKE 1 TABLET (20 MG TOTAL) BY MOUTH DAILY AT 6 PM.     Cardiovascular:  Antilipid - Statins Failed - 12/19/2019  9:04 AM      Failed - Total Cholesterol in normal range and within 360 days    Cholesterol, Total  Date Value Ref Range Status  12/24/2016 120 100 - 199 mg/dL Final   Cholesterol  Date Value Ref Range Status  01/04/2019 205 (H) <200 mg/dL Final         Failed - LDL in normal range and within 360 days    LDL Cholesterol (Calc)  Date Value Ref Range Status  01/04/2019 146 (H) mg/dL (calc) Final    Comment:    Reference range: <100 . Desirable range <100 mg/dL for primary prevention;   <70 mg/dL for patients with CHD or diabetic patients  with > or = 2 CHD risk factors. [name] Kitchen LDL-C is now calculated using the Martin-Hopkins  calculation, which is a validated novel method providing  better accuracy than the Friedewald equation in the  estimation of LDL-C.  Cresenciano Genre et al. Annamaria Helling. 8768;115(72): 2061-2068  (http://education.[employer].com/faq/FAQ164)          Failed - HDL in normal range and within 360 days    HDL  Date Value Ref Range Status  01/04/2019 40 (L) > OR = 50 mg/dL Final  12/24/2016 29 (L) >39 mg/dL Final         Passed - Triglycerides in normal range and within 360 days    Triglycerides  Date Value Ref Range Status  01/04/2019 87 <150 mg/dL Final         Passed - Patient is not pregnant      Passed - Valid encounter within last 12 months    Recent Outpatient Visits          3 weeks ago [facility] Malfi, [name], FNP   2 months ago Screening for cervical cancer   [facility], [name], FNP   3 months ago Type 2 diabetes mellitus with complication, with long-term current use of insulin [facility])   [facility], [name], FNP   6 months ago Type 2 diabetes mellitus with complication, with long-term current use of insulin [facility])   [facility], Devonne Doughty, DO   11 months ago Type 2 diabetes mellitus with complication, with long-term current use of insulin [facility])   [facility] Merrilyn Puma, Jerrel Ivory, NP

## 2019-12-22 ENCOUNTER — Ambulatory Visit (INDEPENDENT_AMBULATORY_CARE_PROVIDER_SITE_OTHER): Payer: Medicare HMO | Admitting: Pharmacist

## 2019-12-22 DIAGNOSIS — E118 Type 2 diabetes mellitus with unspecified complications: Secondary | ICD-10-CM

## 2019-12-22 DIAGNOSIS — R0609 Other forms of dyspnea: Secondary | ICD-10-CM

## 2019-12-22 DIAGNOSIS — Z794 Long term (current) use of insulin: Secondary | ICD-10-CM

## 2019-12-22 NOTE — Patient Instructions (Signed)
Thank you allowing the Chronic Care Management Team to be a part of your care! It was a pleasure speaking with you today!     CCM (Chronic Care Management) Team    Noreene Larsson RN, MSN, CCM Nurse Care Coordinator  [phone]   Harlow Asa PharmD  Clinical Pharmacist  [phone]   [name] LCSW Clinical Social Worker [phone]  Visit Information  Goals Addressed            This Visit's Progress   . PharmD - Medication Management       CARE PLAN ENTRY (see longitudinal plan of care for additional care plan information)   Current Barriers:  . Chronic Disease Management support, education, and care coordination needs related to T2DM, neuropathy, depression/anxiety, chronic migraines  Pharmacist Clinical Goal(s):  [name] Kitchen Over the next 30 days, patient will work with CM Pharmacist to complete medication review address needs identified.  Interventions: . Provider and Inter-disciplinary care team collaboration (see longitudinal plan of care) . Comprehensive medication review performed; medication list updated in electronic medical record o Identify therapeutic duplication: Reports using both Trelegy and Symbicort inhalers - From review of chart, note on 01/04/2019 previous PCP Cassell Smiles) advised patient to STOP Symbicort and START Trelegy - Patient confirms will stop using and discard Symbicort o Counsel on using Trelegy consistently once daily as directed and rinsing out mouth after reach use. Patient verbalizes understanding o Confirms using rescue (albuterol) inhaler as needed o Caution for risk of dizziness/drowsiness with clonazepam, gabapentin and hydroxyzine, particularly when taken in combination - Patient verbalizes understanding. - States takes hydroxyzine only as needed and lays down after taking . Counsel on importance of blood sugar control and monitoring o Reports taking  - Metformin 1000 mg twice daily - Trulicity 3 mg weekly on  Sundays o Reports recent fasting CBG reading "in 200s", but unable to recall specific readings and states unable to access readings from meter today - Reports has appointment with Endocrinologist tomorrow and will bring meter for review o Counsel on importance of having well balanced diet and limiting carbohydrate portion sizes . Counsel on importance of medication adherence o Encourage patient to obtain and use weekly pillbox o Encourage patient to also consider using alarms on phone as further adherence aid  Patient Self Care Activities:  . Attends all scheduled provider appointments o Next appointment with Endocrinologist on 9/23 . Calls provider office for new concerns or questions   Initial goal documentation and Please see past updates related to this goal by clicking on the "Past Updates" button in the selected goal         Patient verbalizes understanding of instructions provided today.   Telephone follow up appointment with care management team member scheduled for: 10/25 at 1 pm  Harlow Asa, PharmD, [place] [phone]

## 2019-12-22 NOTE — Chronic Care Management (AMB) (Signed)
Chronic Care Management   Note  12/22/2019 Name: Brandi Calderon MRN: [id number] DOB: [birth year]   Subjective:   Brandi Calderon is a 54 y.o. year old female who is a primary care patient of [name], [name], FNP. The CM team was consulted for assistance with chronic disease management and care coordination.   I reached out to [name] by phone today.   Review of patient status, including review of consultants reports, laboratory and other test data, was performed as part of comprehensive evaluation and provision of chronic care management services.   SDOH (Social Determinants of Health) screening performed today: None. See Care Plan for related entries.   Objective:  Lab Results  Component Value Date   CREATININE 0.81 09/07/2019   CREATININE 0.74 01/04/2019   CREATININE 0.77 10/10/2017    Lab Results  Component Value Date   HGBA1C 7.7 (A) 09/07/2019   HGBA1C 7.7 09/07/2019   HGBA1C 7.7 (A) 09/07/2019       Component Value Date/Time   CHOL 205 (H) 01/04/2019 0929   CHOL 120 12/24/2016 1827   TRIG 87 01/04/2019 0929   HDL 40 (L) 01/04/2019 0929   HDL 29 (L) 12/24/2016 1827   CHOLHDL 5.1 (H) 01/04/2019 0929   LDLCALC 146 (H) 01/04/2019 0929    ASCVD Risk: The 10-year ASCVD risk score [name] DC Jr., et al., 2013) is: 19.5%   Values used to calculate the score:     Age: 42 years     Sex: Female     Is Non-Hispanic African American: No     Diabetic: Yes     Tobacco smoker: Yes     Systolic Blood Pressure: 355 mmHg     Is BP treated: Yes     HDL Cholesterol: 40 mg/dL     Total Cholesterol: 205 mg/dL    BP Readings from Last 3 Encounters:  11/26/19 (!) 150/85  09/21/19 116/63  09/07/19 137/71    Allergies  Allergen Reactions  . Penicillins Anaphylaxis    Has patient had a PCN reaction causing immediate rash, facial/tongue/throat swelling, SOB or lightheadedness with hypotension: Yes Has patient had a PCN reaction causing severe rash involving  mucus membranes or skin necrosis: No Has patient had a PCN reaction that required hospitalization: No Has patient had a PCN reaction occurring within the last 10 years: No If all of the above answers are "NO", then may proceed with Cephalosporin use.   Throat swells  . Victoza [Liraglutide]     Severe nausea  . Vicodin [Hydrocodone-Acetaminophen] Rash    And hives    Medications Reviewed Today    Reviewed by Vella Raring, [place] (Pharmacist) on 12/22/19 at 1420  Med List Status: <None>  Medication Order Taking? Sig Documenting Provider Last Dose Status Informant  albuterol (PROAIR HFA) 108 (90 Base) MCG/ACT inhaler [id number] Yes Inhale 1-2 puffs into the lungs every 6 (six) hours as needed for wheezing or shortness of breath. [name], FNP Taking Active   Aspirin-Salicylamide-Caffeine [facility] HEADACHE POWDER PO) [id number] No Take 4 packets by mouth daily as needed (pain).  Patient not taking: Reported on 11/25/2019   [provider] Not Taking Active Self  atorvastatin (LIPITOR) 20 MG tablet [id number] Yes TAKE 1 TABLET (20 MG TOTAL) BY MOUTH DAILY AT 6 PM. Malfi, [name], FNP Taking Active   buPROPion (WELLBUTRIN XL) 150 MG 24 hr tablet [id number] Yes  [provider] Taking Active   clonazePAM (KLONOPIN) 0.5 MG tablet  [id number] Yes Take 0.5 mg by mouth 2 (two) times daily as needed for anxiety (Sometimes takes 3 tablets a day).  [provider] Taking Active Self           Med Note [name], [place]   Thu Sep 05, 2016  9:54 AM)         Patient not taking:      Discontinued 12/22/19 1419 (No longer needed (for PRN medications))   DULoxetine (CYMBALTA) 30 MG capsule [id number] Yes 1 capsule daily.  [provider] Taking Active   fluticasone (FLONASE) 50 MCG/ACT nasal spray [id number] Yes Place 2 sprays into both nostrils daily. [provider] Taking Active   Fluticasone-Umeclidin-Vilant (TRELEGY ELLIPTA) 100-62.5-25 MCG/INH AEPB [id number]  Yes Inhale 1 puff into the lungs daily. [name], NP Taking Active   furosemide (LASIX) 40 MG tablet [id number] Yes Take 40 mg by mouth daily.  [provider] Taking Active   gabapentin (NEURONTIN) 300 MG capsule [id number] Yes Take 300 mg capsule at bedtime [provider] Taking Active   gabapentin (NEURONTIN) 600 MG tablet [id number] Yes TAKE 1 TABLET BY MOUTH THREE TIMES A DAY Malfi, [name], FNP Taking Active   hydrOXYzine (VISTARIL) 25 MG capsule [id number] Yes Take 25-50 mg by mouth at bedtime as needed. [provider] Taking Active   medroxyPROGESTERone (PROVERA) 10 MG tablet [id number] Yes Take 1 tablet (10 mg total) by mouth daily. [name], NP Taking Active   metFORMIN (GLUCOPHAGE) 1000 MG tablet [id number] Yes TAKE ONE TABLET BY MOUTH 2 TIMES A DAY WITH A MEAL Malfi, [name], FNP Taking Active   omega-3 acid ethyl esters (LOVAZA) 1 g capsule [id number] Yes TAKE 1 CAPSULE (1 G TOTAL) BY MOUTH 2 (TWO) TIMES DAILY. [name], FNP Taking Active   OneTouch Delica Lancets 11S MISC [id number]   [provider]  Active   [facility] test strip [id number]   [provider]  Active   pantoprazole (PROTONIX) 40 MG tablet [id number] Yes Take 1 tablet (40 mg total) by mouth daily. [name], FNP Taking Active   SYMBICORT 160-4.5 MCG/ACT inhaler [id number]  SMARTSIG:2 Puff(s) By Mouth Twice Daily [provider]  Active         Discontinued 12/22/19 1419 (No longer needed (for PRN medications))        Patient not taking:      Discontinued 12/22/19 1420 (No longer needed (for PRN medications))   TRULICITY 3 VA/9.1BT SOPN [id number] Yes INJECT 3 MG INTO THE SKIN ONCE A WEEK. Malfi, [name], FNP Taking Active            Med Note Winfield Cunas, [facility]   Wed Dec 22, 2019  2:13 PM) On Sundays  Med List Note [name], [place] 06/25/18 [id number]): UDS 06-25-2018           Assessment:   Goals Addressed             This Visit's Progress   . PharmD - Medication Management       CARE PLAN ENTRY (see longitudinal plan of care for additional care plan information)   Current Barriers:  . Chronic Disease Management support, education, and care coordination needs related to T2DM, neuropathy, depression/anxiety, chronic migraines  Pharmacist Clinical Goal(s):  [name] Kitchen Over the next 30 days, patient will work with CM Pharmacist to complete medication review address needs identified.  Interventions: . Provider and Inter-disciplinary care team collaboration (see longitudinal plan of care) .  Comprehensive medication review performed; medication list updated in electronic medical record o Identify therapeutic duplication: Reports using both Trelegy and Symbicort inhalers - From review of chart, note on 01/04/2019 previous PCP Cassell Smiles) advised patient to STOP Symbicort and START Trelegy - Patient confirms will stop using and discard Symbicort o Counsel on using Trelegy consistently once daily as directed and rinsing out mouth after reach use. Patient verbalizes understanding o Confirms using rescue (albuterol) inhaler as needed o Caution for risk of dizziness/drowsiness with clonazepam, gabapentin and hydroxyzine, particularly when taken in combination - Patient verbalizes understanding. - States takes hydroxyzine only as needed and lays down after taking . Counsel on importance of blood sugar control and monitoring o Reports taking  - Metformin 1000 mg twice daily - Trulicity 3 mg weekly on Sundays o Reports recent fasting CBG reading "in 200s", but unable to recall specific readings and states unable to access readings from meter today - Reports has appointment with Endocrinologist tomorrow and will bring meter for review o Counsel on importance of having well balanced diet and limiting carbohydrate portion sizes . Counsel on importance of medication adherence o Encourage patient to obtain and use weekly  pillbox o Encourage patient to also consider using alarms on phone as further adherence aid  Patient Self Care Activities:  . Attends all scheduled provider appointments o Next appointment with Endocrinologist on 9/23 . Calls provider office for new concerns or questions   Initial goal documentation and Please see past updates related to this goal by clicking on the "Past Updates" button in the selected goal         Plan:  Telephone follow up appointment with care management team member scheduled for: 10/25 at 1 pm  Harlow Asa, PharmD, [place] [phone]

## 2020-01-05 ENCOUNTER — Other Ambulatory Visit: Payer: Self-pay | Admitting: Family Medicine

## 2020-01-05 DIAGNOSIS — U071 COVID-19: Secondary | ICD-10-CM

## 2020-01-05 DIAGNOSIS — R0602 Shortness of breath: Secondary | ICD-10-CM

## 2020-01-05 NOTE — Telephone Encounter (Signed)
Requested Prescriptions  Pending Prescriptions Disp Refills  . albuterol (VENTOLIN HFA) 108 (90 Base) MCG/ACT inhaler [Pharmacy Med Name: ALBUTEROL HFA (PROVENTIL) INH] 6.7 each 1    Sig: INHALE 1-2 PUFFS INTO THE LUNGS EVERY 6 (SIX) HOURS AS NEEDED FOR WHEEZING OR SHORTNESS OF BREATH.     Pulmonology:  Beta Agonists Failed - 01/05/2020  1:18 AM      Failed - One inhaler should last at least one month. If the patient is requesting refills earlier, contact the patient to check for uncontrolled symptoms.      Passed - Valid encounter within last 12 months    Recent Outpatient Visits          1 month ago [facility] Malfi, [name], FNP   3 months ago Screening for cervical cancer   [facility], [name], FNP   4 months ago Type 2 diabetes mellitus with complication, with long-term current use of insulin [facility])   [facility], [name], FNP   7 months ago Type 2 diabetes mellitus with complication, with long-term current use of insulin [facility])   [facility] Olin Hauser, DO   1 year ago Type 2 diabetes mellitus with complication, with long-term current use of insulin [facility])   [facility] Merrilyn Puma, Jerrel Ivory, NP

## 2020-01-06 ENCOUNTER — Ambulatory Visit (INDEPENDENT_AMBULATORY_CARE_PROVIDER_SITE_OTHER): Payer: Medicare HMO | Admitting: General Practice

## 2020-01-06 ENCOUNTER — Telehealth: Payer: Medicaid Other | Admitting: General Practice

## 2020-01-06 DIAGNOSIS — U071 COVID-19: Secondary | ICD-10-CM

## 2020-01-06 DIAGNOSIS — R0602 Shortness of breath: Secondary | ICD-10-CM

## 2020-01-06 DIAGNOSIS — G894 Chronic pain syndrome: Secondary | ICD-10-CM

## 2020-01-06 DIAGNOSIS — E118 Type 2 diabetes mellitus with unspecified complications: Secondary | ICD-10-CM

## 2020-01-06 DIAGNOSIS — F331 Major depressive disorder, recurrent, moderate: Secondary | ICD-10-CM

## 2020-01-06 DIAGNOSIS — F419 Anxiety disorder, unspecified: Secondary | ICD-10-CM

## 2020-01-06 NOTE — Chronic Care Management (AMB) (Signed)
Chronic Care Management   Follow Up Note   01/06/2020 Name: Brandi Calderon MRN: [id number] DOB: 04-24-1965  Referred by: [name], FNP Reason for referral : Chronic Care Management (RNCM Chronic Disease Management and Care Coordination Needs)   Brandi Calderon is a 54 y.o. year old female who is a primary care patient of [name], FNP. The CCM team was consulted for assistance with chronic disease management and care coordination needs.    Review of patient status, including review of consultants reports, relevant laboratory and other test results, and collaboration with appropriate care team members and the patient's provider was performed as part of comprehensive patient evaluation and provision of chronic care management services.    SDOH (Social Determinants of Health) assessments performed: Yes See Care Plan activities for detailed interventions related to [facility])     Outpatient Encounter Medications as of 01/06/2020  Medication Sig Note  . albuterol (VENTOLIN HFA) 108 (90 Base) MCG/ACT inhaler INHALE 1-2 PUFFS INTO THE LUNGS EVERY 6 (SIX) HOURS AS NEEDED FOR WHEEZING OR SHORTNESS OF BREATH.   [name] Kitchen Aspirin-Salicylamide-Caffeine (BC HEADACHE POWDER PO) Take 4 packets by mouth daily as needed (pain). (Patient not taking: Reported on 11/25/2019)   . atorvastatin (LIPITOR) 20 MG tablet TAKE 1 TABLET (20 MG TOTAL) BY MOUTH DAILY AT 6 PM.   . buPROPion (WELLBUTRIN XL) 150 MG 24 hr tablet    . clonazePAM (KLONOPIN) 0.5 MG tablet Take 0.5 mg by mouth 2 (two) times daily as needed for anxiety (Sometimes takes 3 tablets a day).    . DULoxetine (CYMBALTA) 30 MG capsule 1 capsule daily.    . fluticasone (FLONASE) 50 MCG/ACT nasal spray Place 2 sprays into both nostrils daily.   . Fluticasone-Umeclidin-Vilant (TRELEGY ELLIPTA) 100-62.5-25 MCG/INH AEPB Inhale 1 puff into the lungs daily.   . furosemide (LASIX) 40 MG tablet Take 40 mg by mouth daily.    [name] Kitchen gabapentin (NEURONTIN) 300  MG capsule Take 300 mg capsule at bedtime   . gabapentin (NEURONTIN) 600 MG tablet TAKE 1 TABLET BY MOUTH THREE TIMES A DAY   . hydrOXYzine (VISTARIL) 25 MG capsule Take 25-50 mg by mouth at bedtime as needed.   . medroxyPROGESTERone (PROVERA) 10 MG tablet Take 1 tablet (10 mg total) by mouth daily.   . metFORMIN (GLUCOPHAGE) 1000 MG tablet TAKE ONE TABLET BY MOUTH 2 TIMES A DAY WITH A MEAL   . omega-3 acid ethyl esters (LOVAZA) 1 g capsule TAKE 1 CAPSULE (1 G TOTAL) BY MOUTH 2 (TWO) TIMES DAILY.   [name] Lancets 56L MISC    . ONETOUCH VERIO test strip    . pantoprazole (PROTONIX) 40 MG tablet Take 1 tablet (40 mg total) by mouth daily.   . TRULICITY 3 SL/3.7DS SOPN INJECT 3 MG INTO THE SKIN ONCE A WEEK. 12/22/2019: On Sundays   No facility-administered encounter medications on file as of 01/06/2020.     Objective:  BP Readings from Last 3 Encounters:  11/26/19 (!) 150/85  09/21/19 116/63  09/07/19 137/71    Goals Addressed              This Visit's Progress   .  RNCM: Pt-"I have COPD and other health problems" (pt-stated)        CARE PLAN ENTRY (see longtitudinal plan of care for additional care plan information)  Current Barriers:  . Chronic Disease Management support, education, and care coordination needs related to COPD, Anxiety, Depression, and chronic pain syndrome  Clinical Goal(s) related to COPD, Anxiety, Depression, and chronic pain syndrome :  Over the next 120 days, patient will:  . Work with the care management team to address educational, disease management, and care coordination needs  . Begin or continue self health monitoring activities as directed today  adhere to a heart healthy/ADA diet and consider smoking cessation . Call provider office for new or worsened signs and symptoms Oxygen saturation lower than established parameter, Chest pain, Shortness of breath, and New or worsened symptom related to depression, anxiety  and chronic pain  syndrome . Call care management team with questions or concerns . Verbalize basic understanding of patient centered plan of care established today  Interventions related to COPD, Anxiety, Depression, and chronic pain syndrome :  . Evaluation of current treatment plans and patient's adherence to plan as established by provider.  The patient states that she has a lot of problems with her breathing. She knows that she needs to stop smoking but her smoking increased when her son died in 2016.  She is not interested in smoking cessation at this time, but knows there are resources to help with smoking cessation. o Evaluation of sleeping patterns. The patient states she does not sleep well and is often tired during the day and has a hard time falling asleep and staying asleep. She has been told that she snores "bad". Per the patient she has not ever been tested for sleep apnea. Will collaborate with pcp for possible referral to pulmonology.  o 01-06-2020: The patient is recovered from COVID19 virus.  She caught it from her sister in law in August. She got the infusion and also Nicole gave her an inhaler that has been very helpful. She says she feels tired still and the last several days she has had a migraine but is feeling better. She is happy she did not have severe sx/sx or complications from COVID19.   . Assessed patient understanding of disease states. The patient willing to work with the CCM team in the management of her chronic conditions. The patient has a lot of anxiety and depression. She denies suicidal ideation.  Gave the patient contact information for the CCM team and resources available for her in dealing with "panic attacks".  The patient expressed she does not have friends or like to go out in public.  The patient prefers to stay home. She is driving again after 20 years but often she can not go in stores because she panics. She usually waits until her daughters can go with her or she lets them go  get things for her. Discuss team support in managing her chronic conditions. 01-06-2020: the patient is doing better with managing her care. She did not talk long with the RNCM today because she was out driving. The RNCM will schedule a new appointment to follow up at a sooner date.  Reminded the patient of being safe while driving.  . Assessed patient's education and care coordination needs.  Education information to be sent by my chart, mail and EMMI System- will text the patient per the patients request the RNCM contact information. 01-06-2020: The patient is receptive to help from the CCM team. Working with the CCM team to achieve health and wellness goals.  . Provided disease specific education to patient.  Education on ADA diet and smoking cessation.   . Collaborated with appropriate clinical care team members regarding patient needs.  Is currently working with the pharmacist and LCSW.   Patient Self   Care Activities related to COPD, Anxiety, Depression, and Chronic pain syndrome :  . Patient is unable to independently self-manage chronic health conditions  Please see past updates related to this goal by clicking on the "Past Updates" button in the selected goal      .  RNCM: Pt-"I think my sugar dropped yesterday" (pt-stated)        CARE PLAN ENTRY (see longtitudinal plan of care for additional care plan information)  Objective:  Lab Results  Component Value Date   HGBA1C 7.7 (A) 09/07/2019   HGBA1C 7.7 09/07/2019   HGBA1C 7.7 (A) 09/07/2019    Last hemoglobin A1C from endocrinology note is 8.9 on 12-23-2019  Lab Results  Component Value Date   CREATININE 0.81 09/07/2019   CREATININE 0.74 01/04/2019   CREATININE 0.77 10/10/2017 .   . No results found for: EGFR  Current Barriers:  . Knowledge Deficits related to basic Diabetes pathophysiology and self care/management . Knowledge Deficits related to medications used for management of diabetes . Does not use cbg meter as  prescribed . Limited Social Support  Case Manager Clinical Goal(s):  Over the next 120 days, patient will demonstrate improved adherence to prescribed treatment plan for diabetes self care/management as evidenced by:  . daily monitoring and recording of CBG  . adherence to ADA/ carb modified diet . exercise 4 days/week . adherence to prescribed medication regimen  Interventions:  . Provided education to patient about basic DM disease process . Discussed plans with patient for ongoing care management follow up and provided patient with direct contact information for care management team . Provided patient with written educational materials related to hypo and hyperglycemia and importance of correct treatment.  The patient states she had an episode yesterday when she was riding with her daughter where she got shaky, started sweating and went out. She could hear her daughter talking to her but could not respond. Her daughter put lifesavers in her mouth and found some peanut butter crackers that she broke off and gave her and she came around. Education provided that the patient likely had a hypoglycemic event and this was the reason she had the symptoms she did. Evaluation of the patients dietary habits. The patient does not eat 3 meals a day. This event happened in the afternoon and the last time she had eaten was breakfast. Extensive education on eating smaller meals and not skipping meals and also eating snacks.  Advised the patient to have sugary snakes with her when she is away from home. She states she usually knows when they are happening but this came on quickly. Will send information to the patient on management of hyperglycemia and hypoglycemia. 01-06-2020: The patient states that she is feeling better. Did not discuss a lot about her diabetes except she is checking her blood sugars now and they are "high", range given 189 to 200.  . Reviewed scheduled/upcoming provider appointments including: had  an appointment with endocrinology on 12-23-2019. Hemoglobin A1C was up to 8.9.  She will follow up again with endocrinology in November of 2021. . Advised patient, providing education and rationale, to check cbg bid and record, calling pcp or endocrinologist  for findings outside established parameters. 01-06-2020: The patient has started checking her blood sugars more frequently. She states they are still "high", range given 189 to 200.  Will educate more on next outreach. The patient was driving and safety was a concerns. The call was cut short and the RNCM will follow up at   a new appointment.   . Review of patient status, including review of consultants reports, relevant laboratory and other test results, and medications completed.  Patient Self Care Activities:  . UNABLE to independently manage diabetes as evidence of hypoglycemic events and fluctuation of hemoglobin A1C values: 7.7% on 09-07-2019, 8.3% on 06-02-2019, 12-23-2019 8.9 % . Checks blood sugars as prescribed and utilize hyper and hypoglycemia protocol as needed . Adheres to prescribed ADA/carb modified  Please see past updates related to this goal by clicking on the "Past Updates" button in the selected goal          Plan:   Telephone follow up appointment with care management team member scheduled for: 01-17-2020 at 1 pm   Pam Tate RN, MSN, CCM Community Care Coordinator [place]  [facility] Mobile: [phone]  

## 2020-01-06 NOTE — Patient Instructions (Signed)
Visit Information  Goals Addressed              This Visit's Progress   .  RNCM: Pt-"I have COPD and other health problems" (pt-stated)        CARE PLAN ENTRY (see longtitudinal plan of care for additional care plan information)  Current Barriers:  . Chronic Disease Management support, education, and care coordination needs related to COPD, Anxiety, Depression, and chronic pain syndrome  Clinical Goal(s) related to COPD, Anxiety, Depression, and chronic pain syndrome :  Over the next 120 days, patient will:  . Work with the care management team to address educational, disease management, and care coordination needs  . Begin or continue self health monitoring activities as directed today  adhere to a heart healthy/ADA diet and consider smoking cessation . Call provider office for new or worsened signs and symptoms Oxygen saturation lower than established parameter, Chest pain, Shortness of breath, and New or worsened symptom related to depression, anxiety  and chronic pain syndrome . Call care management team with questions or concerns . Verbalize basic understanding of patient centered plan of care established today  Interventions related to COPD, Anxiety, Depression, and chronic pain syndrome :  . Evaluation of current treatment plans and patient's adherence to plan as established by provider.  The patient states that she has a lot of problems with her breathing. She knows that she needs to stop smoking but her smoking increased when her son died in 2014/05/31.  She is not interested in smoking cessation at this time, but knows there are resources to help with smoking cessation. o Evaluation of sleeping patterns. The patient states she does not sleep well and is often tired during the day and has a hard time falling asleep and staying asleep. She has been told that she snores "bad". Per the patient she has not ever been tested for sleep apnea. Will collaborate with pcp for possible referral to  pulmonology.  o 01-06-2020: The patient is recovered from [place] virus.  She caught it from her sister in law in August. She got the infusion and also Elmyra Ricks gave her an inhaler that has been very helpful. She says she feels tired still and the last several days she has had a migraine but is feeling better. She is happy she did not have severe sx/sx or complications from [id number].   . Assessed patient understanding of disease states. The patient willing to work with the CCM team in the management of her chronic conditions. The patient has a lot of anxiety and depression. She denies suicidal ideation.  Gave the patient contact information for the CCM team and resources available for her in dealing with "panic attacks".  The patient expressed she does not have friends or like to go out in public.  The patient prefers to stay home. She is driving again after 20 years but often she can not go in stores because she panics. She usually waits until her daughters can go with her or she lets them go get things for her. Discuss team support in managing her chronic conditions. 01-06-2020: the patient is doing better with managing her care. She did not talk long with the [facility] today because she was out driving. The RNCM will schedule a new appointment to follow up at a sooner date.  Reminded the patient of being safe while driving.  . Assessed patient's education and care coordination needs.  Education information to be sent by my chart, mail and EMMI  System- will text the patient per the patients request the [facility] contact information. 01-06-2020: The patient is receptive to help from the CCM team. Working with the CCM team to achieve health and wellness goals.  . Provided disease specific education to patient.  Education on ADA diet and smoking cessation.   Nash Dimmer with appropriate clinical care team members regarding patient needs.  Is currently working with the pharmacist and LCSW.   Patient Self Care Activities  related to COPD, Anxiety, Depression, and Chronic pain syndrome :  . Patient is unable to independently self-manage chronic health conditions  Please see past updates related to this goal by clicking on the "Past Updates" button in the selected goal      .  RNCM: Pt-"I think my sugar dropped yesterday" (pt-stated)        CARE PLAN ENTRY (see longtitudinal plan of care for additional care plan information)  Objective:  Lab Results  Component Value Date   HGBA1C 7.7 (A) 09/07/2019   HGBA1C 7.7 09/07/2019   HGBA1C 7.7 (A) 09/07/2019    Last hemoglobin A1C from endocrinology note is 8.9 on 12-23-2019  Lab Results  Component Value Date   CREATININE 0.81 09/07/2019   CREATININE 0.74 01/04/2019   CREATININE 0.77 10/10/2017 .   [name] Kitchen No results found for: EGFR  Current Barriers:  [name] Kitchen Knowledge Deficits related to basic Diabetes pathophysiology and self care/management . Knowledge Deficits related to medications used for management of diabetes . Does not use cbg meter as prescribed . Limited Social Support  Case Manager Clinical Goal(s):  Over the next 120 days, patient will demonstrate improved adherence to prescribed treatment plan for diabetes self care/management as evidenced by:  . daily monitoring and recording of CBG  . adherence to ADA/ carb modified diet . exercise 4 days/week . adherence to prescribed medication regimen  Interventions:  . Provided education to patient about basic DM disease process . Discussed plans with patient for ongoing care management follow up and provided patient with direct contact information for care management team . Provided patient with written educational materials related to hypo and hyperglycemia and importance of correct treatment.  The patient states she had an episode yesterday when she was riding with her daughter where she got shaky, started sweating and went out. She could hear her daughter talking to her but could not respond. Her daughter  put lifesavers in her mouth and found some peanut butter crackers that she broke off and gave her and she came around. Education provided that the patient likely had a hypoglycemic event and this was the reason she had the symptoms she did. Evaluation of the patients dietary habits. The patient does not eat 3 meals a day. This event happened in the afternoon and the last time she had eaten was breakfast. Extensive education on eating smaller meals and not skipping meals and also eating snacks.  Advised the patient to have sugary snakes with her when she is away from home. She states she usually knows when they are happening but this came on quickly. Will send information to the patient on management of hyperglycemia and hypoglycemia. 01-06-2020: The patient states that she is feeling better. Did not discuss a lot about her diabetes except she is checking her blood sugars now and they are "high", range given 189 to 200.  [name] Kitchen Reviewed scheduled/upcoming provider appointments including: had an appointment with endocrinology on 12-23-2019. Hemoglobin A1C was up to 8.9.  She will follow up again with endocrinology  in November of 2021. . Advised patient, providing education and rationale, to check cbg bid and record, calling pcp or endocrinologist  for findings outside established parameters. 01-06-2020: The patient has started checking her blood sugars more frequently. She states they are still "high", range given 189 to 200.  Will educate more on next outreach. The patient was driving and safety was a concerns. The call was cut short and the RNCM will follow up at a new appointment.   . Review of patient status, including review of consultants reports, relevant laboratory and other test results, and medications completed.  Patient Self Care Activities:  . UNABLE to independently manage diabetes as evidence of hypoglycemic events and fluctuation of hemoglobin A1C values: 7.7% on 09-07-2019, 8.3% on 06-02-2019, 12-23-2019 8.9  % . Checks blood sugars as prescribed and utilize hyper and hypoglycemia protocol as needed . Adheres to prescribed ADA/carb modified  Please see past updates related to this goal by clicking on the "Past Updates" button in the selected goal         Patient verbalizes understanding of instructions provided today.   Telephone follow up appointment with care management team member scheduled for: 01-17-2020 at 1 pm  Noreene Larsson RN, MSN, [place] Mobile: [phone]

## 2020-01-12 ENCOUNTER — Other Ambulatory Visit: Payer: Self-pay | Admitting: Family Medicine

## 2020-01-12 DIAGNOSIS — E118 Type 2 diabetes mellitus with unspecified complications: Secondary | ICD-10-CM

## 2020-01-12 DIAGNOSIS — Z794 Long term (current) use of insulin: Secondary | ICD-10-CM

## 2020-01-17 ENCOUNTER — Ambulatory Visit: Payer: Self-pay | Admitting: General Practice

## 2020-01-17 ENCOUNTER — Telehealth: Payer: Medicaid Other | Admitting: General Practice

## 2020-01-17 DIAGNOSIS — F32A Depression, unspecified: Secondary | ICD-10-CM

## 2020-01-17 DIAGNOSIS — F419 Anxiety disorder, unspecified: Secondary | ICD-10-CM

## 2020-01-17 DIAGNOSIS — E1140 Type 2 diabetes mellitus with diabetic neuropathy, unspecified: Secondary | ICD-10-CM

## 2020-01-17 DIAGNOSIS — Z794 Long term (current) use of insulin: Secondary | ICD-10-CM

## 2020-01-17 DIAGNOSIS — R0602 Shortness of breath: Secondary | ICD-10-CM

## 2020-01-17 NOTE — Patient Instructions (Signed)
Visit Information  Goals Addressed              This Visit's Progress   .  RNCM: Pt-"I have COPD and other health problems" (pt-stated)        CARE PLAN ENTRY (see longtitudinal plan of care for additional care plan information)  Current Barriers:  . Chronic Disease Management support, education, and care coordination needs related to COPD, Anxiety, Depression, and chronic pain syndrome  Clinical Goal(s) related to COPD, Anxiety, Depression, and chronic pain syndrome :  Over the next 120 days, patient will:  . Work with the care management team to address educational, disease management, and care coordination needs  . Begin or continue self health monitoring activities as directed today  adhere to a heart healthy/ADA diet and consider smoking cessation . Call provider office for new or worsened signs and symptoms Oxygen saturation lower than established parameter, Chest pain, Shortness of breath, and New or worsened symptom related to depression, anxiety  and chronic pain syndrome . Call care management team with questions or concerns . Verbalize basic understanding of patient centered plan of care established today  Interventions related to COPD, Anxiety, Depression, and chronic pain syndrome :  . Evaluation of current treatment plans and patient's adherence to plan as established by provider.  The patient states that she has a lot of problems with her breathing. She knows that she needs to stop smoking but her smoking increased when her son died in 06-16-14.  She is not interested in smoking cessation at this time, but knows there are resources to help with smoking cessation. o Evaluation of sleeping patterns. The patient states she does not sleep well and is often tired during the day and has a hard time falling asleep and staying asleep. She has been told that she snores "bad". Per the patient she has not ever been tested for sleep apnea. Will collaborate with pcp for possible referral to  pulmonology.  o 01-17-2020: The patient is recovered from [place] virus.  She caught it from her sister in law in August. She got the infusion and also Elmyra Ricks gave her an inhaler that has been very helpful. She says she feels tired still and the last several days she has had a migraine but is feeling better. She is happy she did not have severe sx/sx or complications from [id number].  Had one COVID vaccine but can not take the other one until December now. Did not have her card so was unable to provide information.  . Assessed patient understanding of disease states. The patient willing to work with the CCM team in the management of her chronic conditions. The patient has a lot of anxiety and depression. She denies suicidal ideation.  Gave the patient contact information for the CCM team and resources available for her in dealing with "panic attacks".  The patient expressed she does not have friends or like to go out in public.  The patient prefers to stay home. She is driving again after 20 years but often she can not go in stores because she panics. She usually waits until her daughters can go with her or she lets them go get things for her. Discuss team support in managing her chronic conditions. 01-17-2020: the patient is doing better with managing her care. She is getting out more and she is also doing well. Denies any new issues at this time.   . Assessed patient's education and care coordination needs.  Education information to be  sent by my chart, mail and EMMI System- will text the patient per the patients request the [facility] contact information. 01-17-2020: The patient is receptive to help from the CCM team. Working with the CCM team to achieve health and wellness goals.  . Provided disease specific education to patient.  Education on ADA diet and smoking cessation.   Nash Dimmer with appropriate clinical care team members regarding patient needs.  Is currently working with the pharmacist and LCSW.    Patient Self Care Activities related to COPD, Anxiety, Depression, and Chronic pain syndrome :  . Patient is unable to independently self-manage chronic health conditions  Please see past updates related to this goal by clicking on the "Past Updates" button in the selected goal      .  RNCM: Pt-"I think my sugar dropped yesterday" (pt-stated)        CARE PLAN ENTRY (see longtitudinal plan of care for additional care plan information)  Objective:  Lab Results  Component Value Date   HGBA1C 7.7 (A) 09/07/2019   HGBA1C 7.7 09/07/2019   HGBA1C 7.7 (A) 09/07/2019    Last hemoglobin A1C from endocrinology note is 8.9 on 12-23-2019  Lab Results  Component Value Date   CREATININE 0.81 09/07/2019   CREATININE 0.74 01/04/2019   CREATININE 0.77 10/10/2017 .   [name] Kitchen No results found for: EGFR  Current Barriers:  [name] Kitchen Knowledge Deficits related to basic Diabetes pathophysiology and self care/management . Knowledge Deficits related to medications used for management of diabetes . Does not use cbg meter as prescribed . Limited Social Support  Case Manager Clinical Goal(s):  Over the next 120 days, patient will demonstrate improved adherence to prescribed treatment plan for diabetes self care/management as evidenced by:  . daily monitoring and recording of CBG  . adherence to ADA/ carb modified diet . exercise 4 days/week . adherence to prescribed medication regimen  Interventions:  . Provided education to patient about basic DM disease process . Discussed plans with patient for ongoing care management follow up and provided patient with direct contact information for care management team . Provided patient with written educational materials related to hypo and hyperglycemia and importance of correct treatment.  The patient states she had an episode yesterday when she was riding with her daughter where she got shaky, started sweating and went out. She could hear her daughter talking to her but  could not respond. Her daughter put lifesavers in her mouth and found some peanut butter crackers that she broke off and gave her and she came around. Education provided that the patient likely had a hypoglycemic event and this was the reason she had the symptoms she did. Evaluation of the patients dietary habits. The patient does not eat 3 meals a day. This event happened in the afternoon and the last time she had eaten was breakfast. Extensive education on eating smaller meals and not skipping meals and also eating snacks.  Advised the patient to have sugary snakes with her when she is away from home. She states she usually knows when they are happening but this came on quickly. Will send information to the patient on management of hyperglycemia and hypoglycemia. 01-17-2020: The patient states that she is feeling better. Did not discuss a lot about her diabetes except she is checking her blood sugars now and they are "high", range given 189 to 200. Feels that she is eating better and more aware of how to best manage her diabetes. Will continue to monitor.  [name] Kitchen  Reviewed scheduled/upcoming provider appointments including: had an appointment with endocrinology on 12-23-2019. Hemoglobin A1C was up to 8.9.  She will follow up again with endocrinology in November of 2021. . Advised patient, providing education and rationale, to check cbg bid and record, calling pcp or endocrinologist  for findings outside established parameters. 01-17-2020: The patient has started checking her blood sugars more frequently. She states they are still "high", range given 189 to 200.  Education on fasting blood sugars <130 and post prandial <180 or less. The patient verbalized understanding.  . Review of patient status, including review of consultants reports, relevant laboratory and other test results, and medications completed.  Patient Self Care Activities:  . UNABLE to independently manage diabetes as evidence of hypoglycemic events  and fluctuation of hemoglobin A1C values: 7.7% on 09-07-2019, 8.3% on 06-02-2019, 12-23-2019 8.9 % . Checks blood sugars as prescribed and utilize hyper and hypoglycemia protocol as needed . Adheres to prescribed ADA/carb modified  Please see past updates related to this goal by clicking on the "Past Updates" button in the selected goal         Patient verbalizes understanding of instructions provided today.   Telephone follow up appointment with care management team member scheduled for: 03-16-2020 at 1 pm  Noreene Larsson RN, MSN, [place] Mobile: [phone]

## 2020-01-17 NOTE — Chronic Care Management (AMB) (Signed)
Chronic Care Management   Follow Up Note   01/17/2020 Name: Brandi Calderon MRN: [id number] DOB: May 06, 1965  Referred by: [name], FNP Reason for referral : Chronic Care Management (RNCM Follow up for Chronic Disease Management and Care Coordination Needs. )   Brandi Calderon is a 54 y.o. year old female who is a primary care patient of [name], FNP. The CCM team was consulted for assistance with chronic disease management and care coordination needs.    Review of patient status, including review of consultants reports, relevant laboratory and other test results, and collaboration with appropriate care team members and the patient's provider was performed as part of comprehensive patient evaluation and provision of chronic care management services.    SDOH (Social Determinants of Health) assessments performed: Yes See Care Plan activities for detailed interventions related to [facility])     Outpatient Encounter Medications as of 01/17/2020  Medication Sig Note   albuterol (VENTOLIN HFA) 108 (90 Base) MCG/ACT inhaler INHALE 1-2 PUFFS INTO THE LUNGS EVERY 6 (SIX) HOURS AS NEEDED FOR WHEEZING OR SHORTNESS OF BREATH.    Aspirin-Salicylamide-Caffeine (BC HEADACHE POWDER PO) Take 4 packets by mouth daily as needed (pain). (Patient not taking: Reported on 11/25/2019)    atorvastatin (LIPITOR) 20 MG tablet TAKE 1 TABLET (20 MG TOTAL) BY MOUTH DAILY AT 6 PM.    buPROPion (WELLBUTRIN XL) 150 MG 24 hr tablet     clonazePAM (KLONOPIN) 0.5 MG tablet Take 0.5 mg by mouth 2 (two) times daily as needed for anxiety (Sometimes takes 3 tablets a day).     DULoxetine (CYMBALTA) 30 MG capsule 1 capsule daily.     fluticasone (FLONASE) 50 MCG/ACT nasal spray Place 2 sprays into both nostrils daily.    Fluticasone-Umeclidin-Vilant (TRELEGY ELLIPTA) 100-62.5-25 MCG/INH AEPB Inhale 1 puff into the lungs daily.    furosemide (LASIX) 40 MG tablet Take 40 mg by mouth daily.     gabapentin  (NEURONTIN) 300 MG capsule Take 300 mg capsule at bedtime    gabapentin (NEURONTIN) 600 MG tablet TAKE 1 TABLET BY MOUTH THREE TIMES A DAY    hydrOXYzine (VISTARIL) 25 MG capsule Take 25-50 mg by mouth at bedtime as needed.    medroxyPROGESTERone (PROVERA) 10 MG tablet Take 1 tablet (10 mg total) by mouth daily.    metFORMIN (GLUCOPHAGE) 1000 MG tablet TAKE ONE TABLET BY MOUTH 2 TIMES A DAY WITH A MEAL    omega-3 acid ethyl esters (LOVAZA) 1 g capsule TAKE 1 CAPSULE (1 G TOTAL) BY MOUTH 2 (TWO) TIMES DAILY.    OneTouch Delica Lancets 81E MISC     ONETOUCH VERIO test strip     pantoprazole (PROTONIX) 40 MG tablet Take 1 tablet (40 mg total) by mouth daily.    TRULICITY 3 HU/3.1SH SOPN INJECT 3 MG INTO THE SKIN ONCE A WEEK. 12/22/2019: On Sundays   No facility-administered encounter medications on file as of 01/17/2020.     Objective:  BP Readings from Last 3 Encounters:  11/26/19 (!) 150/85  09/21/19 116/63  09/07/19 137/71    Goals Addressed              This Visit's Progress     RNCM: Pt-"I have COPD and other health problems" (pt-stated)        CARE PLAN ENTRY (see longtitudinal plan of care for additional care plan information)  Current Barriers:   Chronic Disease Management support, education, and care coordination needs related to COPD, Anxiety, Depression,  and chronic pain syndrome  Clinical Goal(s) related to COPD, Anxiety, Depression, and chronic pain syndrome :  Over the next 120 days, patient will:   Work with the care management team to address educational, disease management, and care coordination needs   Begin or continue self health monitoring activities as directed today  adhere to a heart healthy/ADA diet and consider smoking cessation  Call provider office for new or worsened signs and symptoms Oxygen saturation lower than established parameter, Chest pain, Shortness of breath, and New or worsened symptom related to depression, anxiety  and chronic  pain syndrome  Call care management team with questions or concerns  Verbalize basic understanding of patient centered plan of care established today  Interventions related to COPD, Anxiety, Depression, and chronic pain syndrome :   Evaluation of current treatment plans and patient's adherence to plan as established by provider.  The patient states that she has a lot of problems with her breathing. She knows that she needs to stop smoking but her smoking increased when her son died in Jun 24, 2014.  She is not interested in smoking cessation at this time, but knows there are resources to help with smoking cessation. o Evaluation of sleeping patterns. The patient states she does not sleep well and is often tired during the day and has a hard time falling asleep and staying asleep. She has been told that she snores "bad". Per the patient she has not ever been tested for sleep apnea. Will collaborate with pcp for possible referral to pulmonology.  o 01-17-2020: The patient is recovered from [place] virus.  She caught it from her sister in law in August. She got the infusion and also Elmyra Ricks gave her an inhaler that has been very helpful. She says she feels tired still and the last several days she has had a migraine but is feeling better. She is happy she did not have severe sx/sx or complications from [id number].  Had one COVID vaccine but can not take the other one until December now. Did not have her card so was unable to provide information.   Assessed patient understanding of disease states. The patient willing to work with the CCM team in the management of her chronic conditions. The patient has a lot of anxiety and depression. She denies suicidal ideation.  Gave the patient contact information for the CCM team and resources available for her in dealing with "panic attacks".  The patient expressed she does not have friends or like to go out in public.  The patient prefers to stay home. She is driving again after  20 years but often she can not go in stores because she panics. She usually waits until her daughters can go with her or she lets them go get things for her. Discuss team support in managing her chronic conditions. 01-17-2020: the patient is doing better with managing her care. She is getting out more and she is also doing well. Denies any new issues at this time.    Assessed patient's education and care coordination needs.  Education information to be sent by my chart, mail and EMMI System- will text the patient per the patients request the [facility] contact information. 01-17-2020: The patient is receptive to help from the CCM team. Working with the CCM team to achieve health and wellness goals.   Provided disease specific education to patient.  Education on ADA diet and smoking cessation.    Collaborated with appropriate clinical care team members regarding patient needs.  Is currently working with the pharmacist and LCSW.   Patient Self Care Activities related to COPD, Anxiety, Depression, and Chronic pain syndrome :   Patient is unable to independently self-manage chronic health conditions  Please see past updates related to this goal by clicking on the "Past Updates" button in the selected goal        RNCM: Pt-"I think my sugar dropped yesterday" (pt-stated)        CARE PLAN ENTRY (see longtitudinal plan of care for additional care plan information)  Objective:  Lab Results  Component Value Date   HGBA1C 7.7 (A) 09/07/2019   HGBA1C 7.7 09/07/2019   HGBA1C 7.7 (A) 09/07/2019    Last hemoglobin A1C from endocrinology note is 8.9 on 12-23-2019  Lab Results  Component Value Date   CREATININE 0.81 09/07/2019   CREATININE 0.74 01/04/2019   CREATININE 0.77 10/10/2017     No results found for: EGFR  Current Barriers:   Knowledge Deficits related to basic Diabetes pathophysiology and self care/management  Knowledge Deficits related to medications used for management of  diabetes  Does not use cbg meter as prescribed  Limited Social Support  Case Manager Clinical Goal(s):  Over the next 120 days, patient will demonstrate improved adherence to prescribed treatment plan for diabetes self care/management as evidenced by:   daily monitoring and recording of CBG   adherence to ADA/ carb modified diet  exercise 4 days/week  adherence to prescribed medication regimen  Interventions:   Provided education to patient about basic DM disease process  Discussed plans with patient for ongoing care management follow up and provided patient with direct contact information for care management team  Provided patient with written educational materials related to hypo and hyperglycemia and importance of correct treatment.  The patient states she had an episode yesterday when she was riding with her daughter where she got shaky, started sweating and went out. She could hear her daughter talking to her but could not respond. Her daughter put lifesavers in her mouth and found some peanut butter crackers that she broke off and gave her and she came around. Education provided that the patient likely had a hypoglycemic event and this was the reason she had the symptoms she did. Evaluation of the patients dietary habits. The patient does not eat 3 meals a day. This event happened in the afternoon and the last time she had eaten was breakfast. Extensive education on eating smaller meals and not skipping meals and also eating snacks.  Advised the patient to have sugary snakes with her when she is away from home. She states she usually knows when they are happening but this came on quickly. Will send information to the patient on management of hyperglycemia and hypoglycemia. 01-17-2020: The patient states that she is feeling better. Did not discuss a lot about her diabetes except she is checking her blood sugars now and they are "high", range given 189 to 200. Feels that she is eating  better and more aware of how to best manage her diabetes. Will continue to monitor.   Reviewed scheduled/upcoming provider appointments including: had an appointment with endocrinology on 12-23-2019. Hemoglobin A1C was up to 8.9.  She will follow up again with endocrinology in November of 2021.  Advised patient, providing education and rationale, to check cbg bid and record, calling pcp or endocrinologist  for findings outside established parameters. 01-17-2020: The patient has started checking her blood sugars more frequently. She states they are still "high", range  given 189 to 200.  Education on fasting blood sugars <130 and post prandial <180 or less. The patient verbalized understanding.   Review of patient status, including review of consultants reports, relevant laboratory and other test results, and medications completed.  Patient Self Care Activities:   UNABLE to independently manage diabetes as evidence of hypoglycemic events and fluctuation of hemoglobin A1C values: 7.7% on 09-07-2019, 8.3% on 06-02-2019, 12-23-2019 8.9 %  Checks blood sugars as prescribed and utilize hyper and hypoglycemia protocol as needed  Adheres to prescribed ADA/carb modified  Please see past updates related to this goal by clicking on the "Past Updates" button in the selected goal          Plan:   Telephone follow up appointment with care management team member scheduled for: 03-16-2020 at 1 pm   [place], MSN, [place] Mobile: [phone]

## 2020-01-24 ENCOUNTER — Ambulatory Visit: Payer: Medicaid Other | Admitting: Pharmacist

## 2020-01-24 ENCOUNTER — Other Ambulatory Visit: Payer: Self-pay | Admitting: Family Medicine

## 2020-01-24 DIAGNOSIS — J418 Mixed simple and mucopurulent chronic bronchitis: Secondary | ICD-10-CM

## 2020-01-24 DIAGNOSIS — F32A Depression, unspecified: Secondary | ICD-10-CM | POA: Diagnosis not present

## 2020-01-24 DIAGNOSIS — F419 Anxiety disorder, unspecified: Secondary | ICD-10-CM

## 2020-01-24 DIAGNOSIS — Z794 Long term (current) use of insulin: Secondary | ICD-10-CM

## 2020-01-24 DIAGNOSIS — F331 Major depressive disorder, recurrent, moderate: Secondary | ICD-10-CM

## 2020-01-24 DIAGNOSIS — E118 Type 2 diabetes mellitus with unspecified complications: Secondary | ICD-10-CM | POA: Diagnosis not present

## 2020-01-24 DIAGNOSIS — R0602 Shortness of breath: Secondary | ICD-10-CM

## 2020-01-24 DIAGNOSIS — E1140 Type 2 diabetes mellitus with diabetic neuropathy, unspecified: Secondary | ICD-10-CM

## 2020-01-24 MED ORDER — TRELEGY ELLIPTA 100-62.5-25 MCG/INH IN AEPB: 1.0000 | INHALATION_SPRAY | Freq: Every day | RESPIRATORY_TRACT | 5 refills | Status: DC

## 2020-01-24 NOTE — Patient Instructions (Signed)
Thank you allowing the Chronic Care Management Team to be a part of your care! It was a pleasure speaking with you today!     CCM (Chronic Care Management) Team    Noreene Larsson RN, MSN, CCM Nurse Care Coordinator  [phone]   Harlow Asa PharmD  Clinical Pharmacist  [phone]   [name] LCSW Clinical Social Worker [phone]  Visit Information  Goals Addressed            This Visit's Progress   . PharmD - Medication Management       CARE PLAN ENTRY (see longitudinal plan of care for additional care plan information)   Current Barriers:  . Chronic Disease Management support, education, and care coordination needs related to T2DM, neuropathy, depression/anxiety, chronic migraines and COPD  Pharmacist Clinical Goal(s):  [name] Kitchen Over the next 30 days, patient will work with CM Pharmacist to complete medication review address needs identified.  Interventions: . Provider and Inter-disciplinary care team collaboration (see longitudinal plan of care) . Follow up regarding COPD symptoms and inhaler use o Reports recent confusion about inhalers as she recently restarted using Symbicort inhaler as filled by [facility].  - From previous review of chart, note on 01/04/2019 previous PCP Cassell Smiles) advised patient to STOP Symbicort and START Trelegy for improved symptom control - From dispensing history, identify that recent Symbicort Rx prescribed by a previous PCP, Dr. Jeanie Cooks) - Reports she is currently needing her albuterol rescue inhaler ~4 times/day for shortness of breath - Identify current Rx for Trelegy is expired. Collaborate with patient's PCP to request new Rx for Trelegy . Rx sent to patient's [place] on using Trelegy consistently once daily as directed and rinsing out mouth after reach use. Patient verbalizes understanding and states she will use Trelegy daily each morning before she brushes her teeth . Counsel on importance of blood  sugar control and monitoring o Reports taking  - Metformin 1000 mg twice daily - Trulicity 3 mg weekly on Sundays o Unable to provide specific readings as reports does not write down her readings; recalls CBGs primarily in upper 100s-200s o Encourage patient to bring glucometer with her to upcoming appointment with Endocrinologist for provider to download and review . Counsel on importance of medication adherence o Encourage patient to obtain and start using weekly pillbox o Denies interest in pill packaging at this time o Have encouraged patient to also consider using alarms on phone as further adherence aid . Encourage patient to follow up with PCP office as patient reports has been having foul smelling urine for ~1 week. Denies other symptoms. States that she will call office today . Collaborate with [facility] to request pharmacy hold off on future refilling/refill requests of Symbicort as patient is instead on Trelegy. o States she has Trelegy Rx ready for patient now.  Patient Self Care Activities:  . Attends all scheduled provider appointments o Next appointment with Endocrinologist on 11/4 . Calls provider office for new concerns or questions   Initial goal documentation and Please see past updates related to this goal by clicking on the "Past Updates" button in the selected goal         Patient verbalizes understanding of instructions provided today.   Telephone follow up appointment with care management team member scheduled for: 11/8 at 1:15pm  Harlow Asa, PharmD, [place] [phone]

## 2020-01-24 NOTE — Chronic Care Management (AMB) (Signed)
Chronic Care Management   Follow Up Note   01/24/2020 Name: Brandi Calderon MRN: [id number] DOB: 04/22/1965  Referred by: [name], FNP Reason for referral : Chronic Care Management (Patient Phone Call)   Brandi Calderon is a 54 y.o. year old female who is a primary care patient of [name], FNP. The CCM team was consulted for assistance with chronic disease management and care coordination needs.    I reached out to Brandi Calderon by phone today.   Coordination of care with [facility].  Review of patient status, including review of consultants reports, relevant laboratory and other test results, and collaboration with appropriate care team members and the patient's provider was performed as part of comprehensive patient evaluation and provision of chronic care management services.    SDOH (Social Determinants of Health) assessments performed: No See Care Plan activities for detailed interventions related to [facility])      Medication List       Accurate as of January 24, 2020  3:05 PM. If you have any questions, ask your nurse or doctor.        albuterol 108 (90 Base) MCG/ACT inhaler Commonly known as: VENTOLIN HFA INHALE 1-2 PUFFS INTO THE LUNGS EVERY 6 (SIX) HOURS AS NEEDED FOR WHEEZING OR SHORTNESS OF BREATH.   atorvastatin 20 MG tablet Commonly known as: LIPITOR TAKE 1 TABLET (20 MG TOTAL) BY MOUTH DAILY AT 6 PM.   BC HEADACHE POWDER PO Take 4 packets by mouth daily as needed (pain).   buPROPion 150 MG 24 hr tablet Commonly known as: WELLBUTRIN XL   clonazePAM 0.5 MG tablet Commonly known as: KLONOPIN Take 0.5 mg by mouth 2 (two) times daily as needed for anxiety (Sometimes takes 3 tablets a day).   DULoxetine 30 MG capsule Commonly known as: CYMBALTA 1 capsule daily.   fluticasone 50 MCG/ACT nasal spray Commonly known as: FLONASE Place 2 sprays into both nostrils daily.   furosemide 40 MG tablet Commonly known as: LASIX Take 40 mg  by mouth daily.   gabapentin 600 MG tablet Commonly known as: NEURONTIN TAKE 1 TABLET BY MOUTH THREE TIMES A DAY   gabapentin 300 MG capsule Commonly known as: NEURONTIN Take 300 mg capsule at bedtime   hydrOXYzine 25 MG capsule Commonly known as: VISTARIL Take 25-50 mg by mouth at bedtime as needed.   medroxyPROGESTERone 10 MG tablet Commonly known as: PROVERA Take 1 tablet (10 mg total) by mouth daily.   metFORMIN 1000 MG tablet Commonly known as: GLUCOPHAGE TAKE ONE TABLET BY MOUTH 2 TIMES A DAY WITH A MEAL   omega-3 acid ethyl esters 1 g capsule Commonly known as: LOVAZA TAKE 1 CAPSULE (1 G TOTAL) BY MOUTH 2 (TWO) TIMES DAILY.   OneTouch Delica Lancets 78G Misc   OneTouch Verio test strip Generic drug: glucose blood   pantoprazole 40 MG tablet Commonly known as: Protonix Take 1 tablet (40 mg total) by mouth daily.   Trelegy Ellipta 100-62.5-25 MCG/INH Aepb Generic drug: Fluticasone-Umeclidin-Vilant Inhale 1 puff into the lungs daily.   Trulicity 3 NF/6.2ZH Sopn Generic drug: Dulaglutide INJECT 3 MG INTO THE SKIN ONCE A WEEK.        Goals Addressed            This Visit's Progress   . PharmD - Medication Management       CARE PLAN ENTRY (see longitudinal plan of care for additional care plan information)   Current Barriers:  . Chronic  Disease Management support, education, and care coordination needs related to T2DM, neuropathy, depression/anxiety, chronic migraines and COPD  Pharmacist Clinical Goal(s):  [name] Kitchen Over the next 30 days, patient will work with CM Pharmacist to complete medication review address needs identified.  Interventions: . Provider and Inter-disciplinary care team collaboration (see longitudinal plan of care) . Follow up regarding COPD symptoms and inhaler use o Reports recent confusion about inhalers as she recently restarted using Symbicort inhaler as filled by [facility].  - From previous review of chart, note on 01/04/2019  previous PCP Cassell Smiles) advised patient to STOP Symbicort and START Trelegy for improved symptom control - From dispensing history, identify that recent Symbicort Rx prescribed by a previous PCP, Dr. Jeanie Cooks) - Reports she is currently needing her albuterol rescue inhaler ~4 times/day for shortness of breath - Identify current Rx for Trelegy is expired. Collaborate with patient's PCP to request new Rx for Trelegy . Rx sent to patient's [place] on using Trelegy consistently once daily as directed and rinsing out mouth after reach use. Patient verbalizes understanding and states she will use Trelegy daily each morning before she brushes her teeth . Counsel on importance of blood sugar control and monitoring o Reports taking  - Metformin 1000 mg twice daily - Trulicity 3 mg weekly on Sundays o Unable to provide specific readings as reports does not write down her readings; recalls CBGs primarily in upper 100s-200s o Encourage patient to bring glucometer with her to upcoming appointment with Endocrinologist for provider to download and review . Counsel on importance of medication adherence o Encourage patient to obtain and start using weekly pillbox o Denies interest in pill packaging at this time o Have encouraged patient to also consider using alarms on phone as further adherence aid . Encourage patient to follow up with PCP office as patient reports has been having foul smelling urine for ~1 week. Denies other symptoms. States that she will call office today . [facility]ate with [facility] to request pharmacy hold off on future refilling/refill requests of Symbicort as patient is instead on Trelegy. o States she has Trelegy Rx ready for patient now.  Patient Self Care Activities:  . Attends all scheduled provider appointments o Next appointment with Endocrinologist on 11/4 . Calls provider office for new concerns or questions   Initial goal documentation and Please see past  updates related to this goal by clicking on the "Past Updates" button in the selected goal         Plan  Telephone follow up appointment with care management team member scheduled for: 11/8 at 1:15pm  Harlow Asa, PharmD, [place] [phone]

## 2020-01-25 ENCOUNTER — Ambulatory Visit: Payer: Medicare HMO | Admitting: Family Medicine

## 2020-01-27 ENCOUNTER — Ambulatory Visit: Payer: Medicaid Other | Admitting: Licensed Clinical Social Worker

## 2020-01-27 ENCOUNTER — Other Ambulatory Visit: Payer: Self-pay | Admitting: Family Medicine

## 2020-01-27 DIAGNOSIS — Z794 Long term (current) use of insulin: Secondary | ICD-10-CM

## 2020-01-27 DIAGNOSIS — E1140 Type 2 diabetes mellitus with diabetic neuropathy, unspecified: Secondary | ICD-10-CM

## 2020-01-27 DIAGNOSIS — E118 Type 2 diabetes mellitus with unspecified complications: Secondary | ICD-10-CM

## 2020-01-27 DIAGNOSIS — F331 Major depressive disorder, recurrent, moderate: Secondary | ICD-10-CM | POA: Diagnosis not present

## 2020-01-27 DIAGNOSIS — R0602 Shortness of breath: Secondary | ICD-10-CM

## 2020-01-27 DIAGNOSIS — F32A Depression, unspecified: Secondary | ICD-10-CM

## 2020-01-27 DIAGNOSIS — F419 Anxiety disorder, unspecified: Secondary | ICD-10-CM | POA: Diagnosis not present

## 2020-01-27 NOTE — Chronic Care Management (AMB) (Signed)
Chronic Case Management  Follow Up Note   01/27/2020 Name: Brandi Calderon MRN: [id number] DOB: 05-23-1965  Referred by: [name], FNP Reason for referral : [place] is a 54 y.o. year old female who is a primary care patient of [name], FNP. The care management team was consulted for assistance with care management and care coordination needs.    Review of patient status, including review of consultants reports, relevant laboratory and other test results, and collaboration with appropriate care team members and the patient's provider was performed as part of comprehensive patient evaluation and provision of chronic care management services.    SDOH (Social Determinants of Health) assessments performed: Yes See Care Plan activities for detailed interventions related to [facility])     Advanced Directives: See Care Plan and Vynca application for related entries.   Goals Addressed    .  SW- "I'm doing a lot better now overall." (pt-stated)        Current Barriers:  . Chronic Mental Health needs related to Depression, Anxiety and Grief . Limited social support . Mental Health Concerns  . Social Isolation . Loss of child in 2016 . Suicidal Ideation/Homicidal Ideation: No  Clinical Social Work Goal(s):  [name] Kitchen Over the next 120 days, patient will work with SW to address concerns related to care coordination needs and lack of education/support/resource connection. LCSW will assist patient in gaining community resource education and additional support and resource connection as well in order to maintain health and mental health appropriately  . Over the next 120 days, patient will demonstrate improved adherence to self care as evidenced by implementing healthy self-care into her daily routine such as: attending all medical appointments, deep breathing exercises, taking time for self-reflection, taking medications as prescribed, drinking water and daily  exercise to improve mobility and mood.  . Over the next 120 days, patient will work with SW bi-monthly by telephone or in person to reduce or manage symptoms related to stress, anxiety and grief. . Over the next 120 days, patient will demonstrate improved health management independence as evidenced by implementing healthy self-care skills and positive support/resources into her daily routine to help cope with stressors and improve overall health and well-being  . Over the next 120 days, patient or caregiver will verbalize basic understanding of depression/stress process and self health management plan as evidenced by her participation in development of long term plan of care and institution of self health management strategies  Interventions: . Patient interviewed and appropriate assessments performed: brief mental health assessment . Provided mental health counseling with regard to coping with loss. LCSW educated patient on available mental health and grief support resources within her nearby area. Patient reports that she does not wish to gain grief therapy at this time. LCSW asked her to consider this resource implementation once the holidays arise as this is a trigger for her. Patient agreeable to this plan.  . Provided patient with information about healthy self-care that she can implement into her daily routine to combat COVID related symptoms as well as to improve her health/mood.  . Discussed plans with patient for ongoing care management follow up and provided patient with direct contact information for care management team . Advised patient to contact CCM LCSW for any urgent social work related issues. . Assisted patient/caregiver with obtaining information about health plan benefits . Provided education and assistance to client regarding Advanced Directives. . Provided education to patient/caregiver about Hospice and/or  Palliative Care services . Encouraged patient to consider a mental  health provider for long term follow up and therapy/counseling for grief support. . Brief CBT provided as patient is dealing with multiple stressors. Patient was educated on ways to combat her negative thinking patterns. . Positive reinforcement provided as patient has been actively working on her self-care by talking and connecting with her daughters, eating well, spending time outside grounding herself while looking and listening to nature. Patient reports that she enjoys listening to her birds outside on her front porch every morning.   Patient Self Care Activities:  . Self administers medications as prescribed . Attends all scheduled provider appointments . Calls provider office for new concerns or questions . Ability for insight . Independent living . Motivation for treatment . Strong family or social support  Patient Coping Strengths:  . Supportive Relationships . Spirituality . Hopefulness . Self Advocate . Able to Communicate Effectively  Patient Self Care Deficits:  . Lacks social connections  Please see past updates related to this goal by clicking on the "Past Updates" button in the selected goal      The care management team will reach out to the patient again over the next quarter days.   [name], BSW, MSW, [email or web address] Phone: [phone]

## 2020-02-04 ENCOUNTER — Other Ambulatory Visit: Payer: Self-pay | Admitting: Family Medicine

## 2020-02-04 DIAGNOSIS — E1140 Type 2 diabetes mellitus with diabetic neuropathy, unspecified: Secondary | ICD-10-CM

## 2020-02-07 ENCOUNTER — Other Ambulatory Visit: Payer: Self-pay | Admitting: Family Medicine

## 2020-02-07 ENCOUNTER — Ambulatory Visit: Payer: Medicare HMO | Admitting: Pharmacist

## 2020-02-07 DIAGNOSIS — Z794 Long term (current) use of insulin: Secondary | ICD-10-CM

## 2020-02-07 DIAGNOSIS — J418 Mixed simple and mucopurulent chronic bronchitis: Secondary | ICD-10-CM

## 2020-02-07 NOTE — Chronic Care Management (AMB) (Signed)
Chronic Care Management   Follow Up Note   02/07/2020 Name: Rola Lennon MRN: [id number] DOB: 09/15/1965  Referred by: [name], FNP Reason for referral : Chronic Care Management (Patient Phone Call)   Valentina Alcoser is a 54 y.o. year old female who is a primary care patient of [name], FNP. The CCM team was consulted for assistance with chronic disease management and care coordination needs.    I reached out to [name] by phone today.   Review of patient status, including review of consultants reports, relevant laboratory and other test results, and collaboration with appropriate care team members and the patient's provider was performed as part of comprehensive patient evaluation and provision of chronic care management services.    SDOH (Social Determinants of Health) assessments performed: No See Care Plan activities for detailed interventions related to [facility])     Outpatient Encounter Medications as of 02/07/2020  Medication Sig Note  . albuterol (VENTOLIN HFA) 108 (90 Base) MCG/ACT inhaler INHALE 1-2 PUFFS INTO THE LUNGS EVERY 6 (SIX) HOURS AS NEEDED FOR WHEEZING OR SHORTNESS OF BREATH.   [name] Kitchen Fluticasone-Umeclidin-Vilant (TRELEGY ELLIPTA) 100-62.5-25 MCG/INH AEPB Inhale 1 puff into the lungs daily.   . metFORMIN (GLUCOPHAGE) 1000 MG tablet TAKE ONE TABLET BY MOUTH 2 TIMES A DAY WITH A MEAL   . Aspirin-Salicylamide-Caffeine (BC HEADACHE POWDER PO) Take 4 packets by mouth daily as needed (pain). (Patient not taking: Reported on 11/25/2019)   . atorvastatin (LIPITOR) 20 MG tablet TAKE 1 TABLET (20 MG TOTAL) BY MOUTH DAILY AT 6 PM.   . buPROPion (WELLBUTRIN XL) 150 MG 24 hr tablet    . clonazePAM (KLONOPIN) 0.5 MG tablet Take 0.5 mg by mouth 2 (two) times daily as needed for anxiety (Sometimes takes 3 tablets a day).    . DULoxetine (CYMBALTA) 30 MG capsule 1 capsule daily.    . fluticasone (FLONASE) 50 MCG/ACT nasal spray Place 2 sprays into both  nostrils daily.   . furosemide (LASIX) 40 MG tablet Take 40 mg by mouth daily.    [name] Kitchen gabapentin (NEURONTIN) 300 MG capsule Take 300 mg capsule at bedtime   . gabapentin (NEURONTIN) 600 MG tablet TAKE 1 TABLET BY MOUTH THREE TIMES A DAY   . hydrOXYzine (VISTARIL) 25 MG capsule Take 25-50 mg by mouth at bedtime as needed.   . medroxyPROGESTERone (PROVERA) 10 MG tablet Take 1 tablet (10 mg total) by mouth daily.   [name] Kitchen omega-3 acid ethyl esters (LOVAZA) 1 g capsule TAKE 1 CAPSULE (1 G TOTAL) BY MOUTH 2 (TWO) TIMES DAILY.   [name] Lancets 44I MISC    . ONETOUCH VERIO test strip    . pantoprazole (PROTONIX) 40 MG tablet Take 1 tablet (40 mg total) by mouth daily.   . TRULICITY 3 HK/7.4QV SOPN INJECT 3 MG INTO THE SKIN ONCE A WEEK. 12/22/2019: On Sundays   No facility-administered encounter medications on file as of 02/07/2020.    Goals Addressed            This Visit's Progress   . PharmD - Medication Management       CARE PLAN ENTRY (see longitudinal plan of care for additional care plan information)   Current Barriers:  . Chronic Disease Management support, education, and care coordination needs related to T2DM, neuropathy, depression/anxiety, chronic migraines and COPD  Pharmacist Clinical Goal(s):  [name] Kitchen Over the next 30 days, patient will work with CM Pharmacist to complete medication review address needs  identified.  Interventions: . Provider and Inter-disciplinary care team collaboration (see longitudinal plan of care) . Perform chart review. Patient seen by Endocrinologist on 11/4. Provider advised patient to: - Continue Metformin 1000 mg twice daily  - Increase Trulicty from 3.0 mg weekly up to 4.5 mg weekly on Sunday  - Check blood sugars fasting each morning and before bed   - Bring meter to next clinic visit - Continue gabapentin 600 mg in the morning and at bedtime plus 300 mg capsule (with the 600 mg capsule) at night - total 900 mg at night  - Return to clinic in 3  months, labs prior   . Follow up regarding COPD symptoms and inhaler use o Reports picked up Trelegy Rx and restarted using once daiy as directed and rinsing out mouth after each use. o Confirms no longer using Symbicort o Reports no longer needing albuterol inhaler as frequently, but has on hand to use as needed . Counsel on importance of blood sugar control and monitoring o Reports taking  - Metformin 1000 mg twice daily - Trulicity 3 mg weekly on Sundays  . Reports has not yet increased Trulicity dose as had supply of Trulicity 3 mg strength remaining, but confirms will increase to Trulicity 4.5 mg weekly as directed o Unable to provide specific readings today o Counsel patient to check blood sugars fasting each morning and before bed as directed by Endocrinologist . Counsel on importance of medication adherence o Again encourage patient to obtain and start using weekly pillbox - Patient reports received info about OTC benefit card from health plan and will use this to obtain pillbox o Have encouraged patient to also consider using alarms on phone as further adherence aid . Again encourage patient to follow up with PCP office for appointment for foul smelling urine o Note patient had scheduled appointment with PCP for evaluation following our last telephone call, but missed appointment (10/26).  o Patient reports she took some old antibiotics that she had on hand and symptoms improved, but have persisted o Advise patient against /counsel on rational for not using previous supplies of antibiotics to self-treat. Encourage patient to call office today to follow up for evaluation of urinary symptoms - Verbalizes understanding and states that she will call today  Patient Self Care Activities:  . To attend all scheduled provider appointments . Calls provider office for new concerns or questions   Initial goal documentation and Please see past updates related to this goal by clicking on the  "Past Updates" button in the selected goal         Plan  Telephone follow up appointment with care management team member scheduled for: 12/6 at 11:15 am  Harlow Asa, PharmD, [place] [phone]

## 2020-02-07 NOTE — Telephone Encounter (Signed)
Requested medication (s) are due for refill today -yes  Requested medication (s) are on the active medication list -yes  Future visit scheduled -no  Last refill: 01/14/20  Notes to clinic: Pariwnr fails lab protocol for medication RF- sent for review   Requested Prescriptions  Pending Prescriptions Disp Refills   atorvastatin (LIPITOR) 20 MG tablet [Pharmacy Med Name: ATORVASTATIN 20 MG TABLET] 30 tablet 1    Sig: TAKE 1 TABLET (20 MG TOTAL) BY MOUTH DAILY AT 6 PM.      Cardiovascular:  Antilipid - Statins Failed - 02/07/2020 12:30 PM      Failed - Total Cholesterol in normal range and within 360 days    Cholesterol, Total  Date Value Ref Range Status  12/24/2016 120 100 - 199 mg/dL Final   Cholesterol  Date Value Ref Range Status  01/04/2019 205 (H) <200 mg/dL Final          Failed - LDL in normal range and within 360 days    LDL Cholesterol (Calc)  Date Value Ref Range Status  01/04/2019 146 (H) mg/dL (calc) Final    Comment:    Reference range: <100 . Desirable range <100 mg/dL for primary prevention;   <70 mg/dL for patients with CHD or diabetic patients  with > or = 2 CHD risk factors. [name] Kitchen LDL-C is now calculated using the Martin-Hopkins  calculation, which is a validated novel method providing  better accuracy than the Friedewald equation in the  estimation of LDL-C.  Cresenciano Genre et al. Annamaria Helling. 7672;094(70): 2061-2068  (http://education.[facility].com/faq/FAQ164)           Failed - HDL in normal range and within 360 days    HDL  Date Value Ref Range Status  01/04/2019 40 (L) > OR = 50 mg/dL Final  12/24/2016 29 (L) >39 mg/dL Final          Failed - Triglycerides in normal range and within 360 days    Triglycerides  Date Value Ref Range Status  01/04/2019 87 <150 mg/dL Final          Passed - Patient is not pregnant      Passed - Valid encounter within last 12 months    Recent Outpatient Visits           2 months ago [facility], [name], FNP   4 months ago Screening for cervical cancer   [facility], [name], FNP   5 months ago Type 2 diabetes mellitus with complication, with long-term current use of insulin [facility])   [facility], [name], FNP   8 months ago Type 2 diabetes mellitus with complication, with long-term current use of insulin [facility])   [facility], Devonne Doughty, DO   1 year ago Type 2 diabetes mellitus with complication, with long-term current use of insulin ([place])   [facility] Merrilyn Puma, Jerrel Ivory, NP                  Requested Prescriptions  Pending Prescriptions Disp Refills   atorvastatin (LIPITOR) 20 MG tablet [Pharmacy Med Name: ATORVASTATIN 20 MG TABLET] 30 tablet 1    Sig: TAKE 1 TABLET (20 MG TOTAL) BY MOUTH DAILY AT 6 PM.      Cardiovascular:  Antilipid - Statins Failed - 02/07/2020 12:30 PM      Failed - Total Cholesterol in normal range and within 360 days  Cholesterol, Total  Date Value Ref Range Status  12/24/2016 120 100 - 199 mg/dL Final   Cholesterol  Date Value Ref Range Status  01/04/2019 205 (H) <200 mg/dL Final          Failed - LDL in normal range and within 360 days    LDL Cholesterol (Calc)  Date Value Ref Range Status  01/04/2019 146 (H) mg/dL (calc) Final    Comment:    Reference range: <100 . Desirable range <100 mg/dL for primary prevention;   <70 mg/dL for patients with CHD or diabetic patients  with > or = 2 CHD risk factors. [name] Kitchen LDL-C is now calculated using the Martin-Hopkins  calculation, which is a validated novel method providing  better accuracy than the Friedewald equation in the  estimation of LDL-C.  Cresenciano Genre et al. Annamaria Helling. 1245;809(98): 2061-2068  (http://education.[facility].com/faq/FAQ164)           Failed - HDL in normal range and within 360 days    HDL  Date Value Ref Range Status  01/04/2019 40 (L) > OR = 50  mg/dL Final  12/24/2016 29 (L) >39 mg/dL Final          Failed - Triglycerides in normal range and within 360 days    Triglycerides  Date Value Ref Range Status  01/04/2019 87 <150 mg/dL Final          Passed - Patient is not pregnant      Passed - Valid encounter within last 12 months    Recent Outpatient Visits           2 months ago [facility], [name], FNP   4 months ago Screening for cervical cancer   [facility], [name], FNP   5 months ago Type 2 diabetes mellitus with complication, with long-term current use of insulin [facility])   [facility], [name], FNP   8 months ago Type 2 diabetes mellitus with complication, with long-term current use of insulin [facility])   [facility] Olin Hauser, DO   1 year ago Type 2 diabetes mellitus with complication, with long-term current use of insulin [facility])   [facility] Merrilyn Puma, Jerrel Ivory, NP

## 2020-02-07 NOTE — Patient Instructions (Signed)
Thank you allowing the Chronic Care Management Team to be a part of your care! It was a pleasure speaking with you today!     CCM (Chronic Care Management) Team    Noreene Larsson RN, MSN, CCM Nurse Care Coordinator  [phone]   Harlow Asa PharmD  Clinical Pharmacist  [phone]   [name] LCSW Clinical Social Worker [phone]  Visit Information  Goals Addressed            This Visit's Progress   . PharmD - Medication Management       CARE PLAN ENTRY (see longitudinal plan of care for additional care plan information)   Current Barriers:  . Chronic Disease Management support, education, and care coordination needs related to T2DM, neuropathy, depression/anxiety, chronic migraines and COPD  Pharmacist Clinical Goal(s):  [name] Kitchen Over the next 30 days, patient will work with CM Pharmacist to complete medication review address needs identified.  Interventions: . Provider and Inter-disciplinary care team collaboration (see longitudinal plan of care) . Perform chart review. Patient seen by Endocrinologist on 11/4. Provider advised patient to: - Continue Metformin 1000 mg twice daily  - Increase Trulicty from 3.0 mg weekly up to 4.5 mg weekly on Sunday  - Check blood sugars fasting each morning and before bed   - Bring meter to next clinic visit - Continue gabapentin 600 mg in the morning and at bedtime plus 300 mg capsule (with the 600 mg capsule) at night - total 900 mg at night  - Return to clinic in 3 months, labs prior   . Follow up regarding COPD symptoms and inhaler use o Reports picked up Trelegy Rx and restarted using once daiy as directed and rinsing out mouth after each use. o Confirms no longer using Symbicort o Reports no longer needing albuterol inhaler as frequently, but has on hand to use as needed . Counsel on importance of blood sugar control and monitoring o Reports taking  - Metformin 1000 mg twice daily - Trulicity 3 mg weekly on Sundays   . Reports has not yet increased Trulicity dose as had supply of Trulicity 3 mg strength remaining, but confirms will increase to Trulicity 4.5 mg weekly as directed o Unable to provide specific readings today o Counsel patient to check blood sugars fasting each morning and before bed as directed by Endocrinologist . Counsel on importance of medication adherence o Again encourage patient to obtain and start using weekly pillbox - Patient reports received info about OTC benefit card from health plan and will use this to obtain pillbox o Have encouraged patient to also consider using alarms on phone as further adherence aid . Again encourage patient to follow up with PCP office for appointment for foul smelling urine o Note patient had scheduled appointment with PCP for evaluation following our last telephone call, but missed appointment (10/26).  o Patient reports she took some old antibiotics that she had on hand and symptoms improved, but have persisted o Advise patient against /counsel on rational for not using previous supplies of antibiotics to self-treat. Encourage patient to call office today to follow up for evaluation of urinary symptoms - Verbalizes understanding and states that she will call today  Patient Self Care Activities:  . To attend all scheduled provider appointments . Calls provider office for new concerns or questions   Initial goal documentation and Please see past updates related to this goal by clicking on the "Past Updates" button in the selected goal  Patient verbalizes understanding of instructions provided today.   Telephone follow up appointment with care management team member scheduled for:  12/6 at 11:15 am  Harlow Asa, PharmD, [place] [phone]

## 2020-02-14 ENCOUNTER — Other Ambulatory Visit: Payer: Self-pay | Admitting: Family Medicine

## 2020-02-14 DIAGNOSIS — E1140 Type 2 diabetes mellitus with diabetic neuropathy, unspecified: Secondary | ICD-10-CM

## 2020-02-15 ENCOUNTER — Other Ambulatory Visit: Payer: Self-pay | Admitting: Nurse Practitioner

## 2020-02-15 DIAGNOSIS — N924 Excessive bleeding in the premenopausal period: Secondary | ICD-10-CM

## 2020-02-15 NOTE — Telephone Encounter (Signed)
Requested medication (s) are due for refill today: Yes  Requested medication (s) are on the active medication list: Yes  Last refill:  12/03/18  Future visit scheduled: No  Notes to clinic:  Prescription has expired.    Requested Prescriptions  Pending Prescriptions Disp Refills   medroxyPROGESTERone (PROVERA) 10 MG tablet [Pharmacy Med Name: MEDROXYPROGESTERONE 10 MG TAB] 90 tablet 3    Sig: TAKE 1 TABLET BY MOUTH EVERY DAY      OB/GYN:  Progestins Passed - 02/15/2020  1:14 AM      Passed - Valid encounter within last 12 months    Recent Outpatient Visits           2 months ago [facility], [name], FNP   4 months ago Screening for cervical cancer   [facility], [name], FNP   5 months ago Type 2 diabetes mellitus with complication, with long-term current use of insulin [facility])   [facility], [name], FNP   8 months ago Type 2 diabetes mellitus with complication, with long-term current use of insulin [facility])   [facility] Olin Hauser, DO   1 year ago Type 2 diabetes mellitus with complication, with long-term current use of insulin [facility])   [facility] Merrilyn Puma, Jerrel Ivory, NP

## 2020-02-21 LAB — HM DIABETES EYE EXAM

## 2020-02-27 ENCOUNTER — Other Ambulatory Visit: Payer: Self-pay | Admitting: Family Medicine

## 2020-02-27 DIAGNOSIS — K219 Gastro-esophageal reflux disease without esophagitis: Secondary | ICD-10-CM

## 2020-02-27 NOTE — Telephone Encounter (Signed)
Requested Prescriptions  Pending Prescriptions Disp Refills  . pantoprazole (PROTONIX) 40 MG tablet [Pharmacy Med Name: PANTOPRAZOLE SOD DR 40 MG TAB] 90 tablet 1    Sig: TAKE 1 TABLET BY MOUTH EVERY DAY     Gastroenterology: Proton Pump Inhibitors Passed - 02/27/2020  8:55 AM      Passed - Valid encounter within last 12 months    Recent Outpatient Visits          3 months ago [facility] [place], [name], FNP   5 months ago Screening for cervical cancer   [facility], [name], FNP   5 months ago Type 2 diabetes mellitus with complication, with long-term current use of insulin [facility])   [facility], [name], FNP   9 months ago Type 2 diabetes mellitus with complication, with long-term current use of insulin [facility])   [facility] Olin Hauser, DO   1 year ago Type 2 diabetes mellitus with complication, with long-term current use of insulin [facility])   [facility] Merrilyn Puma, Jerrel Ivory, NP

## 2020-03-06 ENCOUNTER — Ambulatory Visit: Payer: Medicaid Other | Admitting: Pharmacist

## 2020-03-06 DIAGNOSIS — E118 Type 2 diabetes mellitus with unspecified complications: Secondary | ICD-10-CM

## 2020-03-06 DIAGNOSIS — J418 Mixed simple and mucopurulent chronic bronchitis: Secondary | ICD-10-CM

## 2020-03-06 DIAGNOSIS — Z794 Long term (current) use of insulin: Secondary | ICD-10-CM

## 2020-03-06 NOTE — Patient Instructions (Signed)
Thank you allowing the Chronic Care Management Team to be a part of your care! It was a pleasure speaking with you today!     CCM (Chronic Care Management) Team    Noreene Larsson RN, MSN, CCM Nurse Care Coordinator  [phone]   Harlow Asa PharmD  Clinical Pharmacist  [phone]   [name] LCSW Clinical Social Worker [phone]  Visit Information  Patient Care Plan: General Pharmacy (Adult)    Problem Identified: Disease Progression     Long-Range Goal: Disease Progression Prevented or Minimized   Start Date: 03/06/2020  Expected End Date: 06/04/2020  This Visit's Progress: On track  Priority: High  Note:   Current Barriers:  . Unable to self administer medications as prescribed . Does not contact provider office for questions/concerns  Pharmacist Clinical Goal(s):  [name] Kitchen Over the next 90 days, patient will achieve adherence to monitoring guidelines and medication adherence to achieve therapeutic efficacy. through collaboration with PharmD and provider.   Interventions: . Inter-disciplinary care team collaboration (see longitudinal plan of care) . Follow up with patient regarding urinary symptoms o On 11/8 advised patient to call office to reschedule previous missed appointment with PCP for evaluation of foul-smelling urine o Today patient denies having followed up with office/provider and admits still needs evaluation.  States will call today when we hang up. Confirms has phone number to office  Type 2 Diabetes: . Uncontrolled; current treatment:  o Metformin 1000 mg twice daily o Trulicity 4.5 mg weekly on Sundays (reports increased to this dose, as directed by Endocrinology, on 02/13/20) . Recalls latest glucose readings:  o Last night before bedtime: 274 o This morning: 174 (coffee with sweetener prior to reading) o Patient denies keeping log of results, reports instead brings meter to Endocrinology appointments for download . Counsel patient to check  blood sugars fasting each morning and before bed as directed by Endocrinologist. Counsel patient to taking morning fasting reading prior to coffee with sweetener, in addition to meal.  Medication Adherence: . Again encourage patient to obtain and start using weekly pillbox o Patient reports planning to call health plan today about how to use OTC benefit to obtain pillbox . Confirms using maintenance (Trelegy) inhaler daily as directed, rinsing mouth after each use and using rescue (albuterol) inhaler as needed as directed  Patient Goals/Self-Care Activities . Over the next 90 days, patient will:  o Focus on medication adherence by obtaining and using weekly pillbox as adherence tool o Check blood glucose in morning and at bedtime and bring meter to future appointments with Endocrinologist o Attend medical appointments as scheduled - Next appointment with Endocrinology scheduled for 05/22/2020 (lab work on 05/15/2020)  Follow Up Plan: Telephone follow up appointment with care management team member scheduled for: 04/10/2020      The patient verbalized understanding of instructions, educational materials, and care plan provided today and declined offer to receive copy of patient instructions, educational materials, and care plan.    Harlow Asa, PharmD, [place] [employer] [phone]

## 2020-03-06 NOTE — Chronic Care Management (AMB) (Signed)
Chronic Care Management   Pharmacy Note  03/06/2020 Name: Brandi Calderon MRN: [id number] DOB: 1965-09-04   Subjective:  Brandi Calderon is a 54 y.o. year old female who is a primary care patient of [name], [name], FNP. The CCM team was consulted for assistance with chronic disease management and care coordination needs.    Engaged with patient by telephone for follow up visit in response to provider referral for pharmacy case management and/or care coordination services.   SDOH (Social Determinants of Health) assessments and interventions performed:    Objective:  Lab Results  Component Value Date   CREATININE 0.81 09/07/2019   CREATININE 0.74 01/04/2019   CREATININE 0.77 10/10/2017    Lab Results  Component Value Date   HGBA1C 7.7 (A) 09/07/2019   HGBA1C 7.7 09/07/2019   HGBA1C 7.7 (A) 09/07/2019  A1C from shared record with [place]: 8.9% on 12/23/2019      Component Value Date/Time   CHOL 205 (H) 01/04/2019 0929   CHOL 120 12/24/2016 1827   TRIG 87 01/04/2019 0929   HDL 40 (L) 01/04/2019 0929   HDL 29 (L) 12/24/2016 1827   CHOLHDL 5.1 (H) 01/04/2019 0929   LDLCALC 146 (H) 01/04/2019 0929    Clinical ASCVD: No  The 10-year ASCVD risk score [name] DC Jr., et al., 2013) is: 8.8%   Values used to calculate the score:     Age: 9 years     Sex: Female     Is Non-Hispanic African American: No     Diabetic: Yes     Tobacco smoker: Yes     Systolic Blood Pressure: 98 mmHg     Is BP treated: Yes     HDL Cholesterol: 40 mg/dL     Total Cholesterol: 205 mg/dL     BP Readings from Last 3 Encounters:  11/26/19 (!) 150/85  09/21/19 116/63  09/07/19 137/71    Assessment/Interventions: Review of patient past medical history, allergies, medications, health status, including review of consultants reports, laboratory and other test data, was performed as part of comprehensive evaluation and provision of chronic care management services.    Allergies  Allergen Reactions  . Penicillins Anaphylaxis    Has patient had a PCN reaction causing immediate rash, facial/tongue/throat swelling, SOB or lightheadedness with hypotension: Yes Has patient had a PCN reaction causing severe rash involving mucus membranes or skin necrosis: No Has patient had a PCN reaction that required hospitalization: No Has patient had a PCN reaction occurring within the last 10 years: No If all of the above answers are "NO", then may proceed with Cephalosporin use.   Throat swells  . Victoza [Liraglutide]     Severe nausea  . Vicodin [Hydrocodone-Acetaminophen] Rash    And hives    Allergies as of 03/06/2020      Reactions   Penicillins Anaphylaxis   Has patient had a PCN reaction causing immediate rash, facial/tongue/throat swelling, SOB or lightheadedness with hypotension: Yes Has patient had a PCN reaction causing severe rash involving mucus membranes or skin necrosis: No Has patient had a PCN reaction that required hospitalization: No Has patient had a PCN reaction occurring within the last 10 years: No If all of the above answers are "NO", then may proceed with Cephalosporin use. Throat swells   Victoza [liraglutide]    Severe nausea   Vicodin [hydrocodone-acetaminophen] Rash   And hives      Medication List       Accurate as of  March 06, 2020  1:45 PM. If you have any questions, ask your nurse or doctor.        albuterol 108 (90 Base) MCG/ACT inhaler Commonly known as: VENTOLIN HFA INHALE 1-2 PUFFS INTO THE LUNGS EVERY 6 (SIX) HOURS AS NEEDED FOR WHEEZING OR SHORTNESS OF BREATH.   atorvastatin 20 MG tablet Commonly known as: LIPITOR TAKE 1 TABLET (20 MG TOTAL) BY MOUTH DAILY AT 6 PM.   BC HEADACHE POWDER PO Take 4 packets by mouth daily as needed (pain).   buPROPion 150 MG 24 hr tablet Commonly known as: WELLBUTRIN XL   clonazePAM 0.5 MG tablet Commonly known as: KLONOPIN Take 0.5 mg by mouth 2 (two) times daily as  needed for anxiety (Sometimes takes 3 tablets a day).   DULoxetine 30 MG capsule Commonly known as: CYMBALTA 1 capsule daily.   fluticasone 50 MCG/ACT nasal spray Commonly known as: FLONASE Place 2 sprays into both nostrils daily.   furosemide 40 MG tablet Commonly known as: LASIX Take 40 mg by mouth daily.   gabapentin 300 MG capsule Commonly known as: NEURONTIN Take 300 mg capsule at bedtime   gabapentin 600 MG tablet Commonly known as: NEURONTIN TAKE 1 TABLET BY MOUTH THREE TIMES A DAY   hydrOXYzine 25 MG capsule Commonly known as: VISTARIL Take 25-50 mg by mouth at bedtime as needed.   medroxyPROGESTERone 10 MG tablet Commonly known as: PROVERA Take 1 tablet (10 mg total) by mouth daily.   metFORMIN 1000 MG tablet Commonly known as: GLUCOPHAGE TAKE ONE TABLET BY MOUTH 2 TIMES A DAY WITH A MEAL   omega-3 acid ethyl esters 1 g capsule Commonly known as: LOVAZA TAKE 1 CAPSULE (1 G TOTAL) BY MOUTH 2 (TWO) TIMES DAILY.   OneTouch Delica Lancets 80D Misc   OneTouch Verio test strip Generic drug: glucose blood   pantoprazole 40 MG tablet Commonly known as: PROTONIX TAKE 1 TABLET BY MOUTH EVERY DAY   Trelegy Ellipta 100-62.5-25 MCG/INH Aepb Generic drug: Fluticasone-Umeclidin-Vilant Inhale 1 puff into the lungs daily.   Trulicity 4.5 XI/3.3AS Sopn Generic drug: Dulaglutide Inject 0.5 mLs (4.5 mg total) subcutaneously every 7 (seven) days What changed: Another medication with the same name was removed. Continue taking this medication, and follow the directions you see here.       Patient Active Problem List   Diagnosis Date Noted  . COVID-19 11/25/2019  . Shortness of breath 11/25/2019  . Visit for gynecologic examination 09/21/2019  . Dyspnea on exertion 09/07/2019  . Skin lesion 09/07/2019  . Chronic bilateral low back pain with bilateral sciatica 07/23/2018  . Chronic SI joint pain 07/23/2018  . Anxiety and depression 07/23/2018  . Chronic pain  syndrome 07/23/2018  . Pharmacologic therapy 07/23/2018  . Lumbar degenerative disc disease 07/23/2018  . Marijuana use 07/23/2018  . Foot pain, right 04/30/2016  . Depression, major, recurrent, moderate ([place]) 03/11/2016  . Neuropathy due to type 2 diabetes mellitus ([place]) - severe 12/19/2015  . Menorrhagia 09/28/2015  . Type 2 diabetes mellitus treated with insulin ([place]) 05/24/2015  . Back pain 05/24/2015     Patient Care Plan: General Pharmacy (Adult)    Problem Identified: Disease Progression     Long-Range Goal: Disease Progression Prevented or Minimized   Start Date: 03/06/2020  Expected End Date: 06/04/2020  This Visit's Progress: On track  Priority: High  Note:   Current Barriers:  . Unable to self administer medications as prescribed . Does not contact provider office for questions/concerns  Pharmacist Clinical Goal(s):  [name] Kitchen Over the next 90 days, patient will achieve adherence to monitoring guidelines and medication adherence to achieve therapeutic efficacy. through collaboration with PharmD and provider.   Interventions: . Inter-disciplinary care team collaboration (see longitudinal plan of care) . Follow up with patient regarding urinary symptoms o On 11/8 advised patient to call office to reschedule previous missed appointment with PCP for evaluation of foul-smelling urine o Today patient denies having followed up with office/provider and admits still needs evaluation.  States will call today when we hang up. Confirms has phone number to office  Type 2 Diabetes: . Uncontrolled; current treatment:  o Metformin 1000 mg twice daily o Trulicity 4.5 mg weekly on Sundays (reports increased to this dose, as directed by Endocrinology, on 02/13/20) . Recalls latest glucose readings:  o Last night before bedtime: 274 o This morning: 174 (coffee with sweetener prior to reading) o Patient denies keeping log of results, reports instead brings meter to Endocrinology appointments  for download . Counsel patient to check blood sugars fasting each morning and before bed as directed by Endocrinologist. Counsel patient to taking morning fasting reading prior to coffee with sweetener, in addition to meal.  Medication Adherence: . Again encourage patient to obtain and start using weekly pillbox o Patient reports planning to call health plan today about how to use OTC benefit to obtain pillbox . Confirms using maintenance (Trelegy) inhaler daily as directed, rinsing mouth after each use and using rescue (albuterol) inhaler as needed as directed  Patient Goals/Self-Care Activities . Over the next 90 days, patient will:  o Focus on medication adherence by obtaining and using weekly pillbox as adherence tool o Check blood glucose in morning and at bedtime and bring meter to future appointments with Endocrinologist o Attend medical appointments as scheduled - Next appointment with Endocrinology scheduled for 05/22/2020 (lab work on 05/15/2020)  Follow Up Plan: Telephone follow up appointment with care management team member scheduled for: 04/10/2020    Harlow Asa, PharmD, [facility] [phone]

## 2020-03-07 ENCOUNTER — Other Ambulatory Visit: Payer: Self-pay | Admitting: Family Medicine

## 2020-03-07 DIAGNOSIS — E118 Type 2 diabetes mellitus with unspecified complications: Secondary | ICD-10-CM

## 2020-03-07 DIAGNOSIS — Z794 Long term (current) use of insulin: Secondary | ICD-10-CM

## 2020-03-14 ENCOUNTER — Encounter: Payer: Self-pay | Admitting: Family Medicine

## 2020-03-14 DIAGNOSIS — F321 Major depressive disorder, single episode, moderate: Secondary | ICD-10-CM | POA: Diagnosis not present

## 2020-03-16 ENCOUNTER — Telehealth: Payer: Medicaid Other

## 2020-03-16 ENCOUNTER — Telehealth: Payer: Self-pay | Admitting: General Practice

## 2020-03-16 NOTE — Telephone Encounter (Signed)
°  Chronic Care Management   Outreach Note  03/16/2020 Name: Brandi Calderon MRN: [id number] DOB: 03/26/1966  Referred by: [name], FNP Reason for referral : Appointment (RNCM Chronic Disease Management and Care Coordination Needs)   Brandi Calderon is enrolled in a Managed Medicaid Health Plan: No  An unsuccessful telephone outreach was attempted today. The patient was referred to the case management team for assistance with care management and care coordination.   Follow Up Plan: A HIPAA compliant phone message was left for the patient providing contact information and requesting a return call.   Noreene Larsson RN, MSN, [place] Mobile: [phone]

## 2020-03-21 NOTE — Telephone Encounter (Signed)
Pt has been r/s  

## 2020-03-23 ENCOUNTER — Ambulatory Visit: Payer: Medicare HMO | Admitting: Licensed Clinical Social Worker

## 2020-03-23 DIAGNOSIS — E118 Type 2 diabetes mellitus with unspecified complications: Secondary | ICD-10-CM

## 2020-03-23 DIAGNOSIS — F331 Major depressive disorder, recurrent, moderate: Secondary | ICD-10-CM

## 2020-03-23 DIAGNOSIS — F32A Depression, unspecified: Secondary | ICD-10-CM

## 2020-03-23 DIAGNOSIS — E1140 Type 2 diabetes mellitus with diabetic neuropathy, unspecified: Secondary | ICD-10-CM

## 2020-03-23 NOTE — Chronic Care Management (AMB) (Signed)
Chronic Care Management   Follow Up Note   03/23/2020 Name: Brandi Calderon MRN: [id number] DOB: 1965-12-31   Referred by: [name], FNP Reason for referral : [place] is a 54 y.o. year old female who is a primary care patient of [name], FNP. The care management team was consulted for assistance with care management and care coordination needs.    Review of patient status, including review of consultants reports, relevant laboratory and other test results, and collaboration with appropriate care team members and the patient's provider was performed as part of comprehensive patient evaluation and provision of chronic care management services.    Goals Addressed    .  SW-Manage My Emotions        Timeframe:  Long-Range Goal Priority:  Medium Start Date:  03/23/20                           Expected End Date:  06/21/20                   Follow Up Date 90 days from 03/23/20   - begin personal counseling - call and visit an old friend - check out volunteer opportunities - join a support group - laugh; watch a funny movie or comedian - learn and use visualization or guided imagery - perform a random act of kindness - practice relaxation or meditation daily - start or continue a personal journal - talk about feelings with a friend, family or spiritual advisor - practice positive thinking and self-talk    Why is this important?    When you are stressed, down or upset, your body reacts too.   For example, your blood pressure may get higher; you may have a headache or stomachache.   When your emotions get the best of you, your body's ability to fight off cold and flu gets weak.   These steps will help you manage your emotions.     Current Barriers:  . Chronic Mental Health needs related to Depression, Anxiety and Grief . Limited social support . Mental Health Concerns  . Social Isolation . Loss of child in 2016 . Suicidal  Ideation/Homicidal Ideation: No  Clinical Social Work Goal(s):  [name] Kitchen Over the next 120 days, patient will work with SW to address concerns related to care coordination needs and lack of education/support/resource connection. LCSW will assist patient in gaining community resource education and additional support and resource connection as well in order to maintain health and mental health appropriately  . Over the next 120 days, patient will demonstrate improved adherence to self care as evidenced by implementing healthy self-care into her daily routine such as: attending all medical appointments, deep breathing exercises, taking time for self-reflection, taking medications as prescribed, drinking water and daily exercise to improve mobility and mood.  . Over the next 120 days, patient will work with SW bi-monthly by telephone or in person to reduce or manage symptoms related to stress, anxiety and grief. . Over the next 120 days, patient will demonstrate improved health management independence as evidenced by implementing healthy self-care skills and positive support/resources into her daily routine to help cope with stressors and improve overall health and well-being  . Over the next 120 days, patient or caregiver will verbalize basic understanding of depression/stress process and self health management plan as evidenced by her participation in development of long term plan of care and  institution of self health management strategies  Interventions: . Patient interviewed and appropriate assessments performed: brief mental health assessment . Provided mental health counseling with regard to coping with loss. LCSW educated patient on available mental health and grief support resources within her nearby area. Patient reports that she does not wish to gain grief therapy at this time. She states that she is having a good day and her mood is "great." LCSW asked her to consider this resource implementation if she  experiences any future triggers especially around the holidays. Patient agreeable to this plan.  . Provided patient with information about healthy self-care that she can implement into her daily routine to combat COVID related symptoms as well as to improve her health/mood.  . Discussed plans with patient for ongoing care management follow up and provided patient with direct contact information for care management team . Advised patient to contact CCM LCSW for any urgent social work related issues. . Assisted patient/caregiver with obtaining information about health plan benefits . Provided education and assistance to client regarding Advanced Directives. . Provided education to patient/caregiver about Hospice and/or Palliative Care services . Encouraged patient to consider a mental health provider for long term follow up and therapy/counseling for grief support. . Brief CBT provided as patient is dealing with multiple stressors. Patient was educated on ways to combat her negative thinking patterns. . Positive reinforcement provided as patient has been actively working on her self-care by talking and connecting with her daughters, eating well, spending time outside grounding herself while looking and listening to nature. Patient reports that she enjoys listening to her birds outside on her front porch every morning.   Patient Self Care Activities:  . Self administers medications as prescribed . Attends all scheduled provider appointments . Calls provider office for new concerns or questions . Ability for insight . Independent living . Motivation for treatment . Strong family or social support  Patient Coping Strengths:  . Supportive Relationships . Spirituality . Hopefulness . Self Advocate . Able to Communicate Effectively  Patient Self Care Deficits:  . Lacks social connections  Please see past updates related to this goal by clicking on the "Past Updates" button in the selected goal       The care management team will reach out to the patient again over the next 90 days.   [name], BSW, MSW, [email or web address] Phone: [phone]

## 2020-03-28 ENCOUNTER — Telehealth: Payer: Medicaid Other

## 2020-04-04 ENCOUNTER — Other Ambulatory Visit: Payer: Self-pay | Admitting: Family Medicine

## 2020-04-04 NOTE — Telephone Encounter (Signed)
Medication Refill - Medication: fluticasone (FLONASE) 50 MCG/ACT nasal spray (Patient would like a callback if medication cannot be sent to pharmacy today)   Has the patient contacted their pharmacy? yes (Agent: If no, request that the patient contact the pharmacy for the refill.) (Agent: If yes, when and what did the pharmacy advise?)Contact PCP  Preferred Pharmacy (with phone number or street name):  [id number] - [place], [place] - [address] Phone:  [phone]  Fax:  [phone]       Agent: Please be advised that RX refills may take up to 3 business days. We ask that you follow-up with your pharmacy.

## 2020-04-04 NOTE — Telephone Encounter (Signed)
Requested medication (s) are due for refill today: -  Requested medication (s) are on the active medication list: no  Last refill:  09/21/19  Future visit scheduled: no  Notes to clinic:  historical med and provider   Requested Prescriptions  Pending Prescriptions Disp Refills   fluticasone (FLONASE) 50 MCG/ACT nasal spray      Sig: Place 2 sprays into both nostrils daily.      Ear, Nose, and Throat: Nasal Preparations - Corticosteroids Passed - 04/04/2020 10:16 AM      Passed - Valid encounter within last 12 months    Recent Outpatient Visits           4 months ago COVID-19   [facility], Jodelle Gross, FNP   6 months ago Screening for cervical cancer   [facility], Jodelle Gross, FNP   7 months ago Type 2 diabetes mellitus with complication, with long-term current use of insulin [facility])   [facility], Jodelle Gross, FNP   10 months ago Type 2 diabetes mellitus with complication, with long-term current use of insulin [facility])   [facility] Smitty Cords, DO   1 year ago Type 2 diabetes mellitus with complication, with long-term current use of insulin [facility])   [facility] Kyung Rudd, Alison Stalling, NP

## 2020-04-05 MED ORDER — FLUTICASONE PROPIONATE 50 MCG/ACT NA SUSP
2.0000 | Freq: Every day | NASAL | 1 refills | Status: DC
Start: 2020-04-05 — End: 2020-06-07

## 2020-04-07 DIAGNOSIS — Z0100 Encounter for examination of eyes and vision without abnormal findings: Secondary | ICD-10-CM | POA: Diagnosis not present

## 2020-04-10 ENCOUNTER — Telehealth: Payer: Self-pay | Admitting: Pharmacist

## 2020-04-10 ENCOUNTER — Telehealth: Payer: Medicaid Other

## 2020-04-10 NOTE — Telephone Encounter (Signed)
  Chronic Care Management   Outreach Note  04/10/2020 Name: Brandi Calderon MRN: [id number] DOB: 1966-02-18  Referred by: [name], FNP Reason for referral : No chief complaint on file.   Outreach to patient today.Patient reports that she just left the pharmacy and realized that she needs to return for another refill.   States that she will call the office today to reschedule missed appointment with PCP.  Denies further medication questions/conerns today  Follow Up Plan: CM Pharmacist will reach out to patient next by telephone for appointment on 2/23 at 11:15 am  Harlow Asa, PharmD, [facility] [phone]

## 2020-04-11 DIAGNOSIS — R69 Illness, unspecified: Secondary | ICD-10-CM | POA: Diagnosis not present

## 2020-04-11 DIAGNOSIS — F410 Panic disorder [episodic paroxysmal anxiety] without agoraphobia: Secondary | ICD-10-CM | POA: Diagnosis not present

## 2020-04-13 ENCOUNTER — Encounter: Payer: Self-pay | Admitting: Family Medicine

## 2020-04-13 ENCOUNTER — Other Ambulatory Visit: Payer: Self-pay

## 2020-04-13 ENCOUNTER — Telehealth (INDEPENDENT_AMBULATORY_CARE_PROVIDER_SITE_OTHER): Payer: Medicare HMO | Admitting: Family Medicine

## 2020-04-13 DIAGNOSIS — R350 Frequency of micturition: Secondary | ICD-10-CM | POA: Diagnosis not present

## 2020-04-13 MED ORDER — FUROSEMIDE 40 MG PO TABS
40.0000 mg | ORAL_TABLET | Freq: Every day | ORAL | 0 refills | Status: DC
Start: 2020-04-13 — End: 2020-06-21

## 2020-04-13 MED ORDER — NITROFURANTOIN MONOHYD MACRO 100 MG PO CAPS: 100.0000 mg | ORAL_CAPSULE | Freq: Two times a day (BID) | ORAL | 0 refills | Status: AC

## 2020-04-13 NOTE — Assessment & Plan Note (Signed)
Urine to be sent to the lab for culture to r/o UTI.  Will treat with Macrobid 100mg  BID x 5 days.  Encouraged to keep upcoming follow up visit with endocrinology for evaluation of T2DM.  Denies polydipsia or polyphagia.

## 2020-04-13 NOTE — Progress Notes (Signed)
Virtual Visit via Telephone  The purpose of this virtual visit is to provide medical care while limiting exposure to the novel coronavirus (COVID19) for both patient and office staff.  Consent was obtained for phone visit:  Yes.   Answered questions that patient had about telehealth interaction:  Yes.   I discussed the limitations, risks, security and privacy concerns of performing an evaluation and management service by telephone. I also discussed with the patient that there may be a patient responsible charge related to this service. The patient expressed understanding and agreed to proceed.  Patient is at home and is accessed via telephone Services are provided by Harlin Rain, FNP-C from [facility])  ---------------------------------------------------------------------- Chief Complaint  Patient presents with  . Urinary Frequency    Foul odor urine x 2-3 weeks. The pt denies any dysuria, urgency.     S: Reviewed CMA documentation. I have called patient and gathered additional HPI as follows:  Brandi Calderon presents for virtual telemedicine visit via telephone for concerns of foul urine odor x 2-3 weeks.  Denies dysuria, urinary frequency, urgency, hesitancy, hematuria, incomplete emptying, incontinence, abdominal pain, lower back pain, fever, chills/shakes.  Patient is currently home Denies any high risk travel to areas of current concern for COVID19. Denies any known or suspected exposure to person with or possibly with COVID19.  Past Medical History:  Diagnosis Date  . Anxiety   . Arthritis   . Asthma   . Back pain   . COPD (chronic obstructive pulmonary disease) ([place])   . Depression   . Diabetes mellitus   . Diabetes mellitus without complication ([place])   . Headache   . Hyperlipidemia   . Hypertension   . Tendonitis    Social History   Tobacco Use  . Smoking status: Current Every Day Smoker    Packs/day: 3.00    Years: 33.00    Pack years:  99.00    Types: Cigarettes  . Smokeless tobacco: Never Used  Vaping Use  . Vaping Use: Never used  Substance Use Topics  . Alcohol use: No  . Drug use: Yes    Types: Marijuana    Comment: States she smokes MJ when she does not have any Tramadol.    Current Outpatient Medications:  .  albuterol (VENTOLIN HFA) 108 (90 Base) MCG/ACT inhaler, INHALE 1-2 PUFFS INTO THE LUNGS EVERY 6 (SIX) HOURS AS NEEDED FOR WHEEZING OR SHORTNESS OF BREATH., Disp: 6.7 each, Rfl: 1 .  Aspirin-Salicylamide-Caffeine (BC HEADACHE POWDER PO), Take 4 packets by mouth daily as needed (pain)., Disp: , Rfl:  .  atorvastatin (LIPITOR) 20 MG tablet, TAKE 1 TABLET (20 MG TOTAL) BY MOUTH DAILY AT 6 PM., Disp: 30 tablet, Rfl: 1 .  buPROPion (WELLBUTRIN XL) 150 MG 24 hr tablet, , Disp: , Rfl:  .  clonazePAM (KLONOPIN) 0.5 MG tablet, Take 0.5 mg by mouth 2 (two) times daily as needed for anxiety (Sometimes takes 3 tablets a day). , Disp: , Rfl:  .  DULoxetine (CYMBALTA) 30 MG capsule, 1 capsule daily. , Disp: , Rfl:  .  fluticasone (FLONASE) 50 MCG/ACT nasal spray, Place 2 sprays into both nostrils daily., Disp: 15.8 mL, Rfl: 1 .  Fluticasone-Umeclidin-Vilant (TRELEGY ELLIPTA) 100-62.5-25 MCG/INH AEPB, Inhale 1 puff into the lungs daily., Disp: 30 each, Rfl: 5 .  gabapentin (NEURONTIN) 300 MG capsule, Take 300 mg capsule at bedtime, Disp: , Rfl:  .  gabapentin (NEURONTIN) 600 MG tablet, TAKE 1 TABLET BY MOUTH THREE  TIMES A DAY, Disp: 270 tablet, Rfl: 0 .  hydrOXYzine (VISTARIL) 25 MG capsule, Take 25-50 mg by mouth at bedtime as needed., Disp: , Rfl:  .  medroxyPROGESTERone (PROVERA) 10 MG tablet, Take 1 tablet (10 mg total) by mouth daily., Disp: 30 tablet, Rfl: 11 .  metFORMIN (GLUCOPHAGE) 1000 MG tablet, TAKE ONE TABLET BY MOUTH 2 TIMES A DAY WITH A MEAL, Disp: 180 tablet, Rfl: 1 .  nitrofurantoin, macrocrystal-monohydrate, (MACROBID) 100 MG capsule, Take 1 capsule (100 mg total) by mouth 2 (two) times daily for 5 days.,  Disp: 10 capsule, Rfl: 0 .  omega-3 acid ethyl esters (LOVAZA) 1 g capsule, TAKE 1 CAPSULE (1 G TOTAL) BY MOUTH 2 (TWO) TIMES DAILY., Disp: 180 capsule, Rfl: 3 .  OneTouch Delica Lancets [id number] MISC, , Disp: , Rfl:  .  ONETOUCH VERIO test strip, , Disp: , Rfl:  .  pantoprazole (PROTONIX) 40 MG tablet, TAKE 1 TABLET BY MOUTH EVERY DAY, Disp: 90 tablet, Rfl: 1 .  TRULICITY 4.5 0000000 SOPN, Inject 0.5 mLs (4.5 mg total) subcutaneously every 7 (seven) days, Disp: , Rfl:  .  cloNIDine (CATAPRES) 0.1 MG tablet, Take 0.1 mg by mouth at bedtime., Disp: , Rfl:  .  furosemide (LASIX) 40 MG tablet, Take 1 tablet (40 mg total) by mouth daily., Disp: 30 tablet, Rfl: 0  Depression screen [facility] 2/9 11/04/2019 09/07/2019 09/07/2019  Decreased Interest 1 2 0  Down, Depressed, Hopeless 1 2 0  PHQ - 2 Score 2 4 0  Altered sleeping 3 3 -  Tired, decreased energy 3 3 -  Change in appetite 2 3 -  Feeling bad or failure about yourself  2 2 -  Trouble concentrating 3 3 -  Moving slowly or fidgety/restless 3 3 -  Suicidal thoughts 0 0 -  PHQ-9 Score 18 21 -  Difficult doing work/chores Not difficult at all Not difficult at all -    GAD 7 : Generalized Anxiety Score 09/07/2019 09/07/2019 06/02/2019  Nervous, Anxious, on Edge 3 3 3   Control/stop worrying 2 2 3   Worry too much - different things 2 2 3   Trouble relaxing 3 3 3   Restless 3 3 3   Easily annoyed or irritable 0 0 3  Afraid - awful might happen 0 0 0  Total GAD 7 Score 13 13 18   Anxiety Difficulty Not difficult at all Not difficult at all Very difficult    -------------------------------------------------------------------------- O: No physical exam performed due to remote telephone encounter.  Physical Exam: Patient remotely monitored without video.  Verbal communication appropriate.  Cognition normal.  Recent Results (from the past 2160 hour(s))  HM DIABETES EYE EXAM     Status: None   Collection Time: 02/21/20 12:00 AM  Result Value Ref Range   HM  Diabetic Eye Exam No Retinopathy No Retinopathy    -------------------------------------------------------------------------- A&P:  Problem List Items Addressed This Visit      Other   Urinary frequency - Primary    Urine to be sent to the lab for culture to r/o UTI.  Will treat with Macrobid 100mg  BID x 5 days.  Encouraged to keep upcoming follow up visit with endocrinology for evaluation of T2DM.  Denies polydipsia or polyphagia.        Relevant Medications   nitrofurantoin, macrocrystal-monohydrate, (MACROBID) 100 MG capsule   Other Relevant Orders   Urine Culture      Meds ordered this encounter  Medications  . furosemide (LASIX) 40 MG tablet  Sig: Take 1 tablet (40 mg total) by mouth daily.    Dispense:  30 tablet    Refill:  0  . nitrofurantoin, macrocrystal-monohydrate, (MACROBID) 100 MG capsule    Sig: Take 1 capsule (100 mg total) by mouth 2 (two) times daily for 5 days.    Dispense:  10 capsule    Refill:  0    Follow-up: - Return if symptoms worsen or fail to improve - To stop by clinic today to have urine sent to lab for culture  Patient verbalizes understanding with the above medical recommendations including the limitation of remote medical advice.  Specific follow-up and call-back criteria were given for patient to follow-up or seek medical care more urgently if needed.  - Time spent in direct consultation with patient on phone: 6 minutes  Harlin Rain, [facility] 04/13/2020, 8:54 AM

## 2020-04-17 ENCOUNTER — Ambulatory Visit: Payer: Self-pay | Admitting: General Practice

## 2020-04-17 ENCOUNTER — Telehealth: Payer: Medicare HMO | Admitting: General Practice

## 2020-04-17 DIAGNOSIS — E118 Type 2 diabetes mellitus with unspecified complications: Secondary | ICD-10-CM

## 2020-04-17 DIAGNOSIS — Z794 Long term (current) use of insulin: Secondary | ICD-10-CM

## 2020-04-17 DIAGNOSIS — F419 Anxiety disorder, unspecified: Secondary | ICD-10-CM

## 2020-04-17 DIAGNOSIS — R0602 Shortness of breath: Secondary | ICD-10-CM

## 2020-04-17 DIAGNOSIS — F32A Depression, unspecified: Secondary | ICD-10-CM

## 2020-04-17 NOTE — Chronic Care Management (AMB) (Signed)
Chronic Care Management   CCM RN Visit Note  04/17/2020 Name: Brandi Calderon MRN: [id number] DOB: May 31, 1965  Subjective: Brandi Calderon is a 55 y.o. year old female who is a primary care patient of [name], [name], FNP. The care management team was consulted for assistance with disease management and care coordination needs.    Engaged with patient by telephone for follow up visit in response to provider referral for case management and/or care coordination services.   Consent to Services:  Engaged with the CCM team   Patient agreed to services and verbal consent obtained.   Assessment: Review of patient past medical history, allergies, medications, health status, including review of consultants reports, laboratory and other test data, was performed as part of comprehensive evaluation and provision of chronic care management services.   SDOH (Social Determinants of Health) assessments and interventions performed:    CCM Care Plan  Allergies  Allergen Reactions   Penicillins Anaphylaxis    Has patient had a PCN reaction causing immediate rash, facial/tongue/throat swelling, SOB or lightheadedness with hypotension: Yes Has patient had a PCN reaction causing severe rash involving mucus membranes or skin necrosis: No Has patient had a PCN reaction that required hospitalization: No Has patient had a PCN reaction occurring within the last 10 years: No If all of the above answers are "NO", then may proceed with Cephalosporin use.   Throat swells   Victoza [Liraglutide]     Severe nausea   Vicodin [Hydrocodone-Acetaminophen] Rash    And hives    Outpatient Encounter Medications as of 04/17/2020  Medication Sig   albuterol (VENTOLIN HFA) 108 (90 Base) MCG/ACT inhaler INHALE 1-2 PUFFS INTO THE LUNGS EVERY 6 (SIX) HOURS AS NEEDED FOR WHEEZING OR SHORTNESS OF BREATH.   Aspirin-Salicylamide-Caffeine (BC HEADACHE POWDER PO) Take 4 packets by mouth daily as needed (pain).    atorvastatin (LIPITOR) 20 MG tablet TAKE 1 TABLET (20 MG TOTAL) BY MOUTH DAILY AT 6 PM.   buPROPion (WELLBUTRIN XL) 150 MG 24 hr tablet    clonazePAM (KLONOPIN) 0.5 MG tablet Take 0.5 mg by mouth 2 (two) times daily as needed for anxiety (Sometimes takes 3 tablets a day).    cloNIDine (CATAPRES) 0.1 MG tablet Take 0.1 mg by mouth at bedtime.   DULoxetine (CYMBALTA) 30 MG capsule 1 capsule daily.    fluticasone (FLONASE) 50 MCG/ACT nasal spray Place 2 sprays into both nostrils daily.   Fluticasone-Umeclidin-Vilant (TRELEGY ELLIPTA) 100-62.5-25 MCG/INH AEPB Inhale 1 puff into the lungs daily.   furosemide (LASIX) 40 MG tablet Take 1 tablet (40 mg total) by mouth daily.   gabapentin (NEURONTIN) 300 MG capsule Take 300 mg capsule at bedtime   gabapentin (NEURONTIN) 600 MG tablet TAKE 1 TABLET BY MOUTH THREE TIMES A DAY   hydrOXYzine (VISTARIL) 25 MG capsule Take 25-50 mg by mouth at bedtime as needed.   medroxyPROGESTERone (PROVERA) 10 MG tablet Take 1 tablet (10 mg total) by mouth daily.   metFORMIN (GLUCOPHAGE) 1000 MG tablet TAKE ONE TABLET BY MOUTH 2 TIMES A DAY WITH A MEAL   nitrofurantoin, macrocrystal-monohydrate, (MACROBID) 100 MG capsule Take 1 capsule (100 mg total) by mouth 2 (two) times daily for 5 days.   omega-3 acid ethyl esters (LOVAZA) 1 g capsule TAKE 1 CAPSULE (1 G TOTAL) BY MOUTH 2 (TWO) TIMES DAILY.   OneTouch Delica Lancets 03J MISC    ONETOUCH VERIO test strip    pantoprazole (PROTONIX) 40 MG tablet TAKE 1 TABLET BY MOUTH EVERY  DAY   TRULICITY 4.5 XL/2.4MW SOPN Inject 0.5 mLs (4.5 mg total) subcutaneously every 7 (seven) days   No facility-administered encounter medications on file as of 04/17/2020.    Patient Active Problem List   Diagnosis Date Noted   Urinary frequency 04/13/2020   COVID-19 11/25/2019   Shortness of breath 11/25/2019   Visit for gynecologic examination 09/21/2019   Dyspnea on exertion 09/07/2019   Skin lesion 09/07/2019    Chronic bilateral low back pain with bilateral sciatica 07/23/2018   Chronic SI joint pain 07/23/2018   Anxiety and depression 07/23/2018   Chronic pain syndrome 07/23/2018   Pharmacologic therapy 07/23/2018   Lumbar degenerative disc disease 07/23/2018   Marijuana use 07/23/2018   Foot pain, right 04/30/2016   Depression, major, recurrent, moderate ([place]) 03/11/2016   Neuropathy due to type 2 diabetes mellitus ([place]) - severe 12/19/2015   Menorrhagia 09/28/2015   Type 2 diabetes mellitus treated with insulin ([place]) 05/24/2015   Back pain 05/24/2015    Conditions to be addressed/monitored:COPD, DMII, Anxiety and Depression  Care Plan : RNCM: COPD (Adult)  Updates made by Vanita Ingles since 04/17/2020 12:00 AM    Problem: RNCM: Psychological Adjustment to Diagnosis (COPD)   Priority: High    Goal: RNCM: COPD Adjustment to Disease Achieved   Note:   Current Barriers:   Knowledge deficits related to basic understanding of COPD disease process  Knowledge deficits related to basic COPD self care/management  Knowledge deficit related to basic understanding of how to use inhalers and how inhaled medications work  Knowledge deficit related to importance of energy conservation  Limited Social Support  Unable to independently manage COPD  Lacks social connections  Does not contact provider office for questions/concerns  Case Manager Clinical Goal(s):  Over the next 120 days patient will report using inhalers as prescribed including rinsing mouth after use  Over the next 120 days patient will report utilizing pursed lip breathing for shortness of breath  Over the next 120 days, patient will be able to verbalize understanding of COPD action plan and when to seek appropriate levels of medical care  Over the next 120 days, patient will engage in lite exercise as tolerated to build/regain stamina and strength and reduce shortness of breath through activity  tolerance  Over the next 120 days, patient will verbalize basic understanding of COPD disease process and self care activities  Over the next 120 days, patient will not be hospitalized for COPD exacerbation  Interventions:   Collaboration with Malfi, [name], FNP regarding development and update of comprehensive plan of care as evidenced by provider attestation and co-signature  Inter-disciplinary care team collaboration (see longitudinal plan of care)  Provided patient with basic written and verbal COPD education on self care/management/and exacerbation prevention   Provided patient with COPD action plan and reinforced importance of daily self assessment  Discussed Pulmonary Rehab and offered to assist with referral placement  Provided written and verbal instructions on pursed lip breathing and utilized returned demonstration as teach back  Provided instruction about proper use of medications used for management of COPD including inhalers  Advised patient to self assesses COPD action plan zone and make appointment with provider if in the yellow zone for 48 hours without improvement.  Provided patient with education about the role of exercise in the management of COPD  Advised patient to engage in light exercise as tolerated 3-5 days a week  Provided education about and advised patient to utilize infection prevention strategies  to reduce risk of respiratory infection  Patient Goals/Self-Care Activities:   - caregiver stress acknowledged  - caregiver support provided  - counseling provided  - decision-making supported  - depression screen reviewed  - emotional support provided  - problem-solving facilitated  - verbalization of feelings encouraged Follow Up Plan: Telephone follow up appointment with care management team member scheduled for: 06-12-2020 at 2:30 pm   Task: RNCM: Support Psychosocial Response to Chronic Obstructive Pulmonary Disease   Note:   Care  Management Activities:    - caregiver stress acknowledged - caregiver support provided - counseling provided - decision-making supported - depression screen reviewed - emotional support provided - problem-solving facilitated - verbalization of feelings encouraged       Care Plan : RNCM: Diabetes Type 2 (Adult)  Updates made by Vanita Ingles since 04/17/2020 12:00 AM    Problem: RNCM: Glycemic Management (Diabetes, Type 2)   Priority: Medium    Goal: RNCM: Glycemic Management Optimized   Priority: Medium  Note:   Objective:  Lab Results  Component Value Date   HGBA1C 7.7 (A) 09/07/2019   HGBA1C 7.7 09/07/2019   HGBA1C 7.7 (A) 09/07/2019   Hemoglobin A1C at endocrinology 12-23-2019 was 8.9 Lab Results  Component Value Date   CREATININE 0.81 09/07/2019   CREATININE 0.74 01/04/2019   CREATININE 0.77 10/10/2017     No results found for: EGFR Current Barriers:   Knowledge Deficits related to basic Diabetes pathophysiology and self care/management  Knowledge Deficits related to medications used for management of diabetes  Limited Social Support  Unable to independently manage DM  Unable to self administer medications as prescribed  Does not contact provider office for questions/concerns Case Manager Clinical Goal(s):   Collaboration with Malfi, [name], FNP regarding development and update of comprehensive plan of care as evidenced by provider attestation and co-signature  Inter-disciplinary care team collaboration (see longitudinal plan of care)  Over the next 120 days, patient will demonstrate improved adherence to prescribed treatment plan for diabetes self care/management as evidenced by:   daily monitoring and recording of CBG   adherence to ADA/ carb modified diet  adherence to prescribed medication regimen Interventions:   Provided education to patient about basic DM disease process  Reviewed medications with patient and discussed importance of  medication adherence  Discussed plans with patient for ongoing care management follow up and provided patient with direct contact information for care management team  Provided patient with written educational materials related to hypo and hyperglycemia and importance of correct treatment  Review of patient status, including review of consultants reports, relevant laboratory and other test results, and medications completed. Patient Goals/Self-Care Activities  Over the next 120 days, patient will:  - UNABLE to independently manage DM Self administers oral medications as prescribed Attends all scheduled provider appointments Checks blood sugars as prescribed and utilize hyper and hypoglycemia protocol as needed Adheres to prescribed ADA/carb modified - barriers to adherence to treatment plan identified - blood glucose monitoring encouraged - blood glucose readings reviewed - resources required to improve adherence to care identified - self-awareness of signs/symptoms of hypo or hyperglycemia encouraged - use of blood glucose monitoring log promoted Follow Up Plan: Telephone follow up appointment with care management team member scheduled for: 06-12-2020 at 2:30 pm   Task: RNCM: Alleviate Barriers to Glycemic Management   Note:   Care Management Activities:    - barriers to adherence to treatment plan identified - blood glucose monitoring encouraged - blood glucose  readings reviewed - resources required to improve adherence to care identified - self-awareness of signs/symptoms of hypo or hyperglycemia encouraged - use of blood glucose monitoring log promoted       Care Plan : RNCM: Depression (Adult)  Updates made by Vanita Ingles since 04/17/2020 12:00 AM    Problem: RNCM: Depression Identification (Depression)   Priority: Medium    Goal: RNCM: Depressive Symptoms Identified   Priority: Medium  Note:   Current Barriers:   Chronic Disease Management support and education  needs related to depression and anxiety  Lacks caregiver support.   Unable to independently manage depression  Lacks social connections  Does not contact provider office for questions/concerns  Nurse Case Manager Clinical Goal(s):   Over the next 120 days, patient will verbalize understanding of plan for effective management of anxiety and depression   Over the next 120 days, patient will work with [facility], CCM team and pcp to address needs related to depression and anxiety exacerbations  Over the next 120 days, the patient will demonstrate ongoing self health care management ability as evidenced by taking medications as directed, following the plan of care and working with the CCM team to optimize health and well being.   Interventions:   1:1 collaboration with Malfi, [name], FNP regarding development and update of comprehensive plan of care as evidenced by provider attestation and co-signature  Inter-disciplinary care team collaboration (see longitudinal plan of care)  Evaluation of current treatment plan related to anxiety and depression  and patient's adherence to plan as established by provider.  Advised patient to call the office for changes in mood, anxiety, or depression  Provided education to patient re: talking to [facility] when she is in a depressed state, working with CCM team during exacerbations   Discussed plans with patient for ongoing care management follow up and provided patient with direct contact information for care management team  Patient Goals/Self-Care Activities Over the next 120 days, patient will:  - Patient will attend all scheduled provider appointments Patient will call pharmacy for medication refills Patient will attend church or other social activities Patient will call provider office for new concerns or questions Patient will work with BSW to address care coordination needs and will continue to work with the clinical team to address health care and  disease management related needs.   - anxiety screen reviewed - depression screen reviewed - medication list reviewed  Follow Up Plan: Telephone follow up appointment with care management team member scheduled for: 06-13-2019  at 2:30 pm       Task: RNCM: Identify Depressive Symptoms and Facilitate Treatment   Note:   Care Management Activities:    - anxiety screen reviewed - depression screen reviewed - medication list reviewed         Plan:Telephone follow up appointment with care management team member scheduled for:  06-12-2020 at 230 pm   [place], MSN, [place] Mobile: [phone]

## 2020-04-17 NOTE — Patient Instructions (Signed)
Visit Information  Patient Care Plan: General Pharmacy (Adult)    Problem Identified: Disease Progression     Long-Range Goal: Disease Progression Prevented or Minimized   Start Date: 03/06/2020  Expected End Date: 06/04/2020  This Visit's Progress: On track  Priority: High  Note:   Current Barriers:  . Unable to self administer medications as prescribed . Does not contact provider office for questions/concerns  Pharmacist Clinical Goal(s):  [name] Kitchen Over the next 90 days, patient will achieve adherence to monitoring guidelines and medication adherence to achieve therapeutic efficacy. through collaboration with PharmD and provider.   Interventions: . Inter-disciplinary care team collaboration (see longitudinal plan of care) . Follow up with patient regarding urinary symptoms o On 11/8 advised patient to call office to reschedule previous missed appointment with PCP for evaluation of foul-smelling urine o Today patient denies having followed up with office/provider and admits still needs evaluation.  States will call today when we hang up. Confirms has phone number to office  Type 2 Diabetes: . Uncontrolled; current treatment:  o Metformin 1000 mg twice daily o Trulicity 4.5 mg weekly on Sundays (reports increased to this dose, as directed by Endocrinology, on 02/13/20) . Recalls latest glucose readings:  o Last night before bedtime: 274 o This morning: 174 (coffee with sweetener prior to reading) o Patient denies keeping log of results, reports instead brings meter to Endocrinology appointments for download . Counsel patient to check blood sugars fasting each morning and before bed as directed by Endocrinologist. Counsel patient to taking morning fasting reading prior to coffee with sweetener, in addition to meal.  Medication Adherence: . Again encourage patient to obtain and start using weekly pillbox o Patient reports planning to call health plan today about how to use OTC benefit to  obtain pillbox . Confirms using maintenance (Trelegy) inhaler daily as directed, rinsing mouth after each use and using rescue (albuterol) inhaler as needed as directed  Patient Goals/Self-Care Activities . Over the next 90 days, patient will:  o Focus on medication adherence by obtaining and using weekly pillbox as adherence tool o Check blood glucose in morning and at bedtime and bring meter to future appointments with Endocrinologist o Attend medical appointments as scheduled - Next appointment with Endocrinology scheduled for 05/22/2020 (lab work on 05/15/2020)  Follow Up Plan: Telephone follow up appointment with care management team member scheduled for: 04/10/2020   Patient Care Plan: General Social Work (Adult)    Problem Identified: Response to Treatment (Depression)     Goal: Response to Treatment Maximized   Note:   Evidence-based guidance:   Engage patient in conversation about the perceived benefits of mental health treatment and quality of therapeutic alliance with his/her mental health professional.   Assess for barriers to attending appointments, such as transportation, financial, sense of slow or little improvement and forgetfulness.   Consider patient resistance to treatment based on stigma related to mental health diagnosis.   Maintain a documented system of ongoing contacts with patient during the first 6 to 12 months of treatment, as missed appointments and disengagement may signal deteriorating condition.   Provide anticipatory guidance about the risk of increased symptoms and potential psychiatric hospitalization for those who have a pattern of nonattendance at mental health appointments.   Re-screen for depressive symptoms at mutually identified intervals.   Notes:    Task: Facilitate Engagement in Mental Health Services   Note:   Care Management Activities:    - barriers to treatment reviewed and addressed - risk  of unmanaged depression discussed     Notes:    Patient Care Plan: RNCM: COPD (Adult)    Problem Identified: RNCM: Psychological Adjustment to Diagnosis (COPD)   Priority: High    Goal: RNCM: COPD Adjustment to Disease Achieved   Note:   Current Barriers:  [name] Kitchen Knowledge deficits related to basic understanding of COPD disease process . Knowledge deficits related to basic COPD self care/management . Knowledge deficit related to basic understanding of how to use inhalers and how inhaled medications work . Knowledge deficit related to importance of energy conservation . Limited Social Support . Unable to independently manage COPD . Lacks social connections . Does not contact provider office for questions/concerns  Case Manager Clinical Goal(s):  Over the next 120 days patient will report using inhalers as prescribed including rinsing mouth after use  Over the next 120 days patient will report utilizing pursed lip breathing for shortness of breath  Over the next 120 days, patient will be able to verbalize understanding of COPD action plan and when to seek appropriate levels of medical care  Over the next 120 days, patient will engage in lite exercise as tolerated to build/regain stamina and strength and reduce shortness of breath through activity tolerance  Over the next 120 days, patient will verbalize basic understanding of COPD disease process and self care activities  Over the next 120 days, patient will not be hospitalized for COPD exacerbation  Interventions:  . Collaboration with Malfi, [name], FNP regarding development and update of comprehensive plan of care as evidenced by provider attestation and co-signature . Inter-disciplinary care team collaboration (see longitudinal plan of care)  Provided patient with basic written and verbal COPD education on self care/management/and exacerbation prevention   Provided patient with COPD action plan and reinforced importance of daily self assessment  Discussed  Pulmonary Rehab and offered to assist with referral placement  Provided written and verbal instructions on pursed lip breathing and utilized returned demonstration as teach back  Provided instruction about proper use of medications used for management of COPD including inhalers  Advised patient to self assesses COPD action plan zone and make appointment with provider if in the yellow zone for 48 hours without improvement.  Provided patient with education about the role of exercise in the management of COPD  Advised patient to engage in light exercise as tolerated 3-5 days a week  Provided education about and advised patient to utilize infection prevention strategies to reduce risk of respiratory infection  Patient Goals/Self-Care Activities:  . - caregiver stress acknowledged . - caregiver support provided . - counseling provided . - decision-making supported . - depression screen reviewed . - emotional support provided . - problem-solving facilitated . - verbalization of feelings encouraged Follow Up Plan: Telephone follow up appointment with care management team member scheduled for: 06-12-2020 at 2:30 pm   Task: RNCM: Support Psychosocial Response to Chronic Obstructive Pulmonary Disease   Note:   Care Management Activities:    - caregiver stress acknowledged - caregiver support provided - counseling provided - decision-making supported - depression screen reviewed - emotional support provided - problem-solving facilitated - verbalization of feelings encouraged       Patient Care Plan: RNCM: Diabetes Type 2 (Adult)    Problem Identified: RNCM: Glycemic Management (Diabetes, Type 2)   Priority: Medium    Goal: RNCM: Glycemic Management Optimized   Priority: Medium  Note:   Objective:  Lab Results  Component Value Date   HGBA1C 7.7 (A) 09/07/2019  HGBA1C 7.7 09/07/2019   HGBA1C 7.7 (A) 09/07/2019 .  Hemoglobin A1C at endocrinology 12-23-2019 was 8.9 Lab Results   Component Value Date   CREATININE 0.81 09/07/2019   CREATININE 0.74 01/04/2019   CREATININE 0.77 10/10/2017 .   [name] Kitchen No results found for: EGFR Current Barriers:  [name] Kitchen Knowledge Deficits related to basic Diabetes pathophysiology and self care/management . Knowledge Deficits related to medications used for management of diabetes . Limited Social Support . Unable to independently manage DM . Unable to self administer medications as prescribed . Does not contact provider office for questions/concerns Case Manager Clinical Goal(s):  [name] Kitchen Collaboration with Malfi, [name], FNP regarding development and update of comprehensive plan of care as evidenced by provider attestation and co-signature . Inter-disciplinary care team collaboration (see longitudinal plan of care) . Over the next 120 days, patient will demonstrate improved adherence to prescribed treatment plan for diabetes self care/management as evidenced by:  . daily monitoring and recording of CBG  . adherence to ADA/ carb modified diet . adherence to prescribed medication regimen Interventions:  . Provided education to patient about basic DM disease process . Reviewed medications with patient and discussed importance of medication adherence . Discussed plans with patient for ongoing care management follow up and provided patient with direct contact information for care management team . Provided patient with written educational materials related to hypo and hyperglycemia and importance of correct treatment . Review of patient status, including review of consultants reports, relevant laboratory and other test results, and medications completed. Patient Goals/Self-Care Activities . Over the next 120 days, patient will:  - UNABLE to independently manage DM Self administers oral medications as prescribed Attends all scheduled provider appointments Checks blood sugars as prescribed and utilize hyper and hypoglycemia protocol as needed Adheres  to prescribed ADA/carb modified - barriers to adherence to treatment plan identified - blood glucose monitoring encouraged - blood glucose readings reviewed - resources required to improve adherence to care identified - self-awareness of signs/symptoms of hypo or hyperglycemia encouraged - use of blood glucose monitoring log promoted Follow Up Plan: Telephone follow up appointment with care management team member scheduled for: 06-12-2020 at 2:30 pm   Task: RNCM: Alleviate Barriers to Glycemic Management   Note:   Care Management Activities:    - barriers to adherence to treatment plan identified - blood glucose monitoring encouraged - blood glucose readings reviewed - resources required to improve adherence to care identified - self-awareness of signs/symptoms of hypo or hyperglycemia encouraged - use of blood glucose monitoring log promoted       Patient Care Plan: RNCM: Depression (Adult)    Problem Identified: RNCM: Depression Identification (Depression)   Priority: Medium    Goal: RNCM: Depressive Symptoms Identified   Priority: Medium  Note:   Current Barriers:  . Chronic Disease Management support and education needs related to depression and anxiety . Lacks caregiver support.  . Unable to independently manage depression . Lacks social connections . Does not contact provider office for questions/concerns  Nurse Case Manager Clinical Goal(s):  [name] Kitchen Over the next 120 days, patient will verbalize understanding of plan for effective management of anxiety and depression  . Over the next 120 days, patient will work with [facility] [place] team and pcp to address needs related to depression and anxiety exacerbations . Over the next 120 days, the patient will demonstrate ongoing self health care management ability as evidenced by taking medications as directed, following the plan of care and working with the  CCM team to optimize health and well being.   Interventions:  . 1:1  collaboration with Malfi, [name], FNP regarding development and update of comprehensive plan of care as evidenced by provider attestation and co-signature . Inter-disciplinary care team collaboration (see longitudinal plan of care) . Evaluation of current treatment plan related to anxiety and depression  and patient's adherence to plan as established by provider. . Advised patient to call the office for changes in mood, anxiety, or depression . Provided education to patient re: talking to [facility] when she is in a depressed state, working with CCM team during exacerbations  . Discussed plans with patient for ongoing care management follow up and provided patient with direct contact information for care management team  Patient Goals/Self-Care Activities Over the next 120 days, patient will:  - Patient will attend all scheduled provider appointments Patient will call pharmacy for medication refills Patient will attend church or other social activities Patient will call provider office for new concerns or questions Patient will work with BSW to address care coordination needs and will continue to work with the clinical team to address health care and disease management related needs.   - anxiety screen reviewed - depression screen reviewed - medication list reviewed  Follow Up Plan: Telephone follow up appointment with care management team member scheduled for: 06-13-2019  at 2:30 pm       Task: RNCM: Identify Depressive Symptoms and Facilitate Treatment   Note:   Care Management Activities:    - anxiety screen reviewed - depression screen reviewed - medication list reviewed         Patient verbalizes understanding of instructions provided today.  Telephone follow up appointment with care management team member scheduled for: 06-12-2020 at 2:30 pm  [place], MSN, [place] Mobile:  [phone]

## 2020-05-22 DIAGNOSIS — E1165 Type 2 diabetes mellitus with hyperglycemia: Secondary | ICD-10-CM | POA: Diagnosis not present

## 2020-05-22 DIAGNOSIS — E1142 Type 2 diabetes mellitus with diabetic polyneuropathy: Secondary | ICD-10-CM | POA: Diagnosis not present

## 2020-05-22 DIAGNOSIS — E782 Mixed hyperlipidemia: Secondary | ICD-10-CM | POA: Diagnosis not present

## 2020-05-22 DIAGNOSIS — R69 Illness, unspecified: Secondary | ICD-10-CM | POA: Diagnosis not present

## 2020-05-23 ENCOUNTER — Ambulatory Visit (INDEPENDENT_AMBULATORY_CARE_PROVIDER_SITE_OTHER): Payer: Medicare HMO | Admitting: Licensed Clinical Social Worker

## 2020-05-23 DIAGNOSIS — E118 Type 2 diabetes mellitus with unspecified complications: Secondary | ICD-10-CM

## 2020-05-23 DIAGNOSIS — G894 Chronic pain syndrome: Secondary | ICD-10-CM

## 2020-05-23 DIAGNOSIS — F32A Depression, unspecified: Secondary | ICD-10-CM

## 2020-05-23 DIAGNOSIS — F331 Major depressive disorder, recurrent, moderate: Secondary | ICD-10-CM

## 2020-05-23 NOTE — Chronic Care Management (AMB) (Signed)
Chronic Care Management    Clinical Social Work Note  05/23/2020 Name: Brandi Calderon MRN: [id number] DOB: [birth year]  Brandi Calderon is a 55 y.o. year old female who is a primary care patient of [name], [name], FNP. The CCM team was consulted to assist the patient with chronic disease management and/or care coordination needs related to: Grief Counseling.   Engaged with patient by telephone for follow up visit in response to provider referral for social work chronic care management and care coordination services.   Consent to Services:  The patient was given the following information about Chronic Care Management services today, agreed to services, and gave verbal consent: 1. CCM service includes personalized support from designated clinical staff supervised by the primary care provider, including individualized plan of care and coordination with other care providers 2. 24/7 contact phone numbers for assistance for urgent and routine care needs. 3. Service will only be billed when office clinical staff spend 20 minutes or more in a month to coordinate care. 4. Only one practitioner may furnish and bill the service in a calendar month. 5.The patient may stop CCM services at any time (effective at the end of the month) by phone call to the office staff. 6. The patient will be responsible for cost sharing (co-pay) of up to 20% of the service fee (after annual deductible is met). Patient agreed to services and consent obtained.  Patient agreed to services and consent obtained.   Assessment: Review of patient past medical history, allergies, medications, and health status, including review of relevant consultants reports was performed today as part of a comprehensive evaluation and provision of chronic care management and care coordination services.     SDOH (Social Determinants of Health) assessments and interventions performed:    Advanced Directives Status: See Care Plan for related  entries.  CCM Care Plan  Allergies  Allergen Reactions  . Penicillins Anaphylaxis    Has patient had a PCN reaction causing immediate rash, facial/tongue/throat swelling, SOB or lightheadedness with hypotension: Yes Has patient had a PCN reaction causing severe rash involving mucus membranes or skin necrosis: No Has patient had a PCN reaction that required hospitalization: No Has patient had a PCN reaction occurring within the last 10 years: No If all of the above answers are "NO", then may proceed with Cephalosporin use.   Throat swells  . Victoza [Liraglutide]     Severe nausea  . Vicodin [Hydrocodone-Acetaminophen] Rash    And hives    Outpatient Encounter Medications as of 05/23/2020  Medication Sig  . albuterol (VENTOLIN HFA) 108 (90 Base) MCG/ACT inhaler INHALE 1-2 PUFFS INTO THE LUNGS EVERY 6 (SIX) HOURS AS NEEDED FOR WHEEZING OR SHORTNESS OF BREATH.  [name] Kitchen Aspirin-Salicylamide-Caffeine (BC HEADACHE POWDER PO) Take 4 packets by mouth daily as needed (pain).  [name] Kitchen atorvastatin (LIPITOR) 20 MG tablet TAKE 1 TABLET (20 MG TOTAL) BY MOUTH DAILY AT 6 PM.  . buPROPion (WELLBUTRIN XL) 150 MG 24 hr tablet   . clonazePAM (KLONOPIN) 0.5 MG tablet Take 0.5 mg by mouth 2 (two) times daily as needed for anxiety (Sometimes takes 3 tablets a day).   . cloNIDine (CATAPRES) 0.1 MG tablet Take 0.1 mg by mouth at bedtime.  . DULoxetine (CYMBALTA) 30 MG capsule 1 capsule daily.   . fluticasone (FLONASE) 50 MCG/ACT nasal spray Place 2 sprays into both nostrils daily.  . Fluticasone-Umeclidin-Vilant (TRELEGY ELLIPTA) 100-62.5-25 MCG/INH AEPB Inhale 1 puff into the lungs daily.  . furosemide (LASIX) 40 MG  tablet Take 1 tablet (40 mg total) by mouth daily.  [name] Kitchen gabapentin (NEURONTIN) 300 MG capsule Take 300 mg capsule at bedtime  . gabapentin (NEURONTIN) 600 MG tablet TAKE 1 TABLET BY MOUTH THREE TIMES A DAY  . hydrOXYzine (VISTARIL) 25 MG capsule Take 25-50 mg by mouth at bedtime as needed.  .  medroxyPROGESTERone (PROVERA) 10 MG tablet Take 1 tablet (10 mg total) by mouth daily.  . metFORMIN (GLUCOPHAGE) 1000 MG tablet TAKE ONE TABLET BY MOUTH 2 TIMES A DAY WITH A MEAL  . omega-3 acid ethyl esters (LOVAZA) 1 g capsule TAKE 1 CAPSULE (1 G TOTAL) BY MOUTH 2 (TWO) TIMES DAILY.  [name] Lancets 02H MISC   . ONETOUCH VERIO test strip   . pantoprazole (PROTONIX) 40 MG tablet TAKE 1 TABLET BY MOUTH EVERY DAY  . TRULICITY 4.5 EN/2.7PO SOPN Inject 0.5 mLs (4.5 mg total) subcutaneously every 7 (seven) days   No facility-administered encounter medications on file as of 05/23/2020.    Patient Active Problem List   Diagnosis Date Noted  . Urinary frequency 04/13/2020  . COVID-19 11/25/2019  . Shortness of breath 11/25/2019  . Visit for gynecologic examination 09/21/2019  . Dyspnea on exertion 09/07/2019  . Skin lesion 09/07/2019  . Chronic bilateral low back pain with bilateral sciatica 07/23/2018  . Chronic SI joint pain 07/23/2018  . Anxiety and depression 07/23/2018  . Chronic pain syndrome 07/23/2018  . Pharmacologic therapy 07/23/2018  . Lumbar degenerative disc disease 07/23/2018  . Marijuana use 07/23/2018  . Foot pain, right 04/30/2016  . Depression, major, recurrent, moderate ([place]) 03/11/2016  . Neuropathy due to type 2 diabetes mellitus ([place]) - severe 12/19/2015  . Menorrhagia 09/28/2015  . Type 2 diabetes mellitus treated with insulin ([place]) 05/24/2015  . Back pain 05/24/2015    Conditions to be addressed/monitored: Anxiety and Depression; Mental Health Concerns   Care Plan : General Social Work (Adult)  Updates made by Greg Cutter, LCSW since 05/23/2020 12:00 AM    Problem: Response to Treatment (Depression)     Long-Range Goal: Response to Treatment Maximized   Start Date: 05/23/2020  Priority: Medium  Note:   Evidence-based guidance:   Engage patient in conversation about the perceived benefits of mental health treatment and quality of therapeutic  alliance with his/her mental health professional.   Assess for barriers to attending appointments, such as transportation, financial, sense of slow or little improvement and forgetfulness.   Consider patient resistance to treatment based on stigma related to mental health diagnosis.   Maintain a documented system of ongoing contacts with patient during the first 6 to 12 months of treatment, as missed appointments and disengagement may signal deteriorating condition.   Provide anticipatory guidance about the risk of increased symptoms and potential psychiatric hospitalization for those who have a pattern of nonattendance at mental health appointments.   Re-screen for depressive symptoms at mutually identified intervals.   Notes:   Timeframe:  Long-Range Goal Priority:  Medium  Start Date:  05/23/20                         Expected End Date:  08/20/20                    Follow Up Date- 06/27/20  Current Barriers:  . Chronic Mental Health needs related to Depression, Anxiety and Grief . Limited social support . Mental Health Concerns  . Social Isolation . Loss of  child in 2016 . Suicidal Ideation/Homicidal Ideation: No  Clinical Social Work Goal(s):  [name] Kitchen Over the next 120 days, patient will work with SW to address concerns related to care coordination needs and lack of education/support/resource connection. LCSW will assist patient in gaining community resource education and additional support and resource connection as well in order to maintain health and mental health appropriately  . Over the next 120 days, patient will demonstrate improved adherence to self care as evidenced by implementing healthy self-care into her daily routine such as: attending all medical appointments, deep breathing exercises, taking time for self-reflection, taking medications as prescribed, drinking water and daily exercise to improve mobility and mood.  . Over the next 120 days, patient will work with SW  bi-monthly by telephone or in person to reduce or manage symptoms related to stress, anxiety and grief. . Over the next 120 days, patient will demonstrate improved health management independence as evidenced by implementing healthy self-care skills and positive support/resources into her daily routine to help cope with stressors and improve overall health and well-being  . Over the next 120 days, patient or caregiver will verbalize basic understanding of depression/stress process and self health management plan as evidenced by her participation in development of long term plan of care and institution of self health management strategies  Interventions: . Patient interviewed and appropriate assessments performed: brief mental health assessment . CCM LCSW spoke with patient's daughter and patient on 05/23/20. She reports that Raelyn's son's death anniversary was on 06-05-2020 (he was killed in 2016) and they went to his grave and shared happy memories of son together. Patient was able to decorate his grave with supplies from [employer]. She reports that patient is getting "stronger everyday" and is on the right path. She shares that she continues to check on her mother daily and transports her to all of her appointments. Patient did not answer phone call today as daughter had just dropped her off to get her hair done.  . Patient went to her diabetic physician yesterday and had some of her medications adjusted.  . Provided mental health counseling with regard to coping with loss. LCSW educated patient and family on available mental health and grief support resources within her nearby area. Patient reports that she does not wish to gain grief therapy at this time as she enjoys talking with CCM LCSW instead. Patient has a psychiatrist at [facility] and her psychiatrist has suggested that she consider counseling as well but she has declined as she is comfortable with her current level of support at this time. LCSW  asked her to consider this resource implementation if her grief increases. Patient agreeable to this plan.  . Provided patient with information about healthy self-care that she can implement into her daily routine to combat grief as well as to improve her health/mood.  . Discussed plans with patient for ongoing care management follow up and provided patient with direct contact information for care management team . Advised patient to contact CCM LCSW for any urgent social work related issues. . Assisted patient/caregiver with obtaining information about health plan benefits . Provided education and assistance to client regarding Advanced Directives. . Provided education to patient/caregiver about Hospice and/or Palliative Care services . Encouraged patient to consider a mental health provider for long term follow up and therapy/counseling for grief support. . Brief CBT provided as patient is dealing with multiple stressors. Patient was educated on ways to combat her negative thinking patterns. . Positive  reinforcement provided as patient has been actively working on her self-care by talking and connecting with her daughters, eating well, spending time outside grounding herself while looking and listening to nature. Patient reports that she enjoys listening to her birds outside on her front porch every morning.  . Patient has been concerned about her father who at [facility] facility as several clients and staff have [place] there at this time. Stress management education provided. . Patient ordered a blood pressure monitor and recently received new eyeglasses and a pulse oximeter.   Patient Self Care Activities:  . Self administers medications as prescribed . Attends all scheduled provider appointments . Calls provider office for new concerns or questions . Ability for insight . Independent living . Motivation for treatment . Strong family or social support  Patient Coping Strengths:  . Supportive  Relationships . Spirituality . Hopefulness . Self Advocate . Able to Communicate Effectively  Patient Self Care Deficits:  . Lacks social connections  Please see past updates related to this goal by clicking on the "Past Updates" button in the selected goal    Task: Facilitate Engagement in [place]   Note:   Care Management Activities:    - barriers to treatment reviewed and addressed - re-screen for depressive symptoms performed - perceived benefits to therapy discussed - risk of unmanaged depression discussed    Notes:       Follow Up Plan: SW will follow up with patient by phone over the next quarter      [name], [place], MSW, [email or web address] Phone: [phone]

## 2020-05-24 ENCOUNTER — Encounter: Payer: Self-pay | Admitting: Family Medicine

## 2020-05-24 ENCOUNTER — Ambulatory Visit: Payer: Medicaid Other | Admitting: Pharmacist

## 2020-05-24 ENCOUNTER — Telehealth: Payer: Medicaid Other

## 2020-05-24 DIAGNOSIS — Z794 Long term (current) use of insulin: Secondary | ICD-10-CM | POA: Diagnosis not present

## 2020-05-24 DIAGNOSIS — E118 Type 2 diabetes mellitus with unspecified complications: Secondary | ICD-10-CM | POA: Diagnosis not present

## 2020-05-24 DIAGNOSIS — E1169 Type 2 diabetes mellitus with other specified complication: Secondary | ICD-10-CM

## 2020-05-24 DIAGNOSIS — F331 Major depressive disorder, recurrent, moderate: Secondary | ICD-10-CM

## 2020-05-24 DIAGNOSIS — F419 Anxiety disorder, unspecified: Secondary | ICD-10-CM | POA: Diagnosis not present

## 2020-05-24 DIAGNOSIS — E785 Hyperlipidemia, unspecified: Secondary | ICD-10-CM

## 2020-05-24 DIAGNOSIS — R69 Illness, unspecified: Secondary | ICD-10-CM | POA: Diagnosis not present

## 2020-05-24 DIAGNOSIS — F32A Depression, unspecified: Secondary | ICD-10-CM | POA: Diagnosis not present

## 2020-05-24 NOTE — Chronic Care Management (AMB) (Signed)
Chronic Care Management Pharmacy Note  05/24/2020 Name:  Brandi Calderon MRN:  [id number] DOB:  [birth year]  Subjective: Brandi Calderon is an 55 y.o. year old female who is a primary patient of Malfi, [name], FNP.  The CCM team was consulted for assistance with disease management and care coordination needs.    Engaged with patient by telephone for follow up visit in response to provider referral for pharmacy case management and/or care coordination services.   Consent to Services:  The patient was given information about Chronic Care Management services, agreed to services, and gave verbal consent prior to initiation of services.  Please see initial visit note for detailed documentation.   Objective:  Lab Results  Component Value Date   CREATININE 0.81 09/07/2019   CREATININE 0.74 01/04/2019   CREATININE 0.77 10/10/2017    Lab Results  Component Value Date   HGBA1C 7.7 (A) 09/07/2019   HGBA1C 7.7 09/07/2019   HGBA1C 7.7 (A) 09/07/2019  A1C 8.1% on 05/22/2020 per sharred record from [place]      Component Value Date/Time   CHOL 205 (H) 01/04/2019 0929   CHOL 120 12/24/2016 1827   TRIG 87 01/04/2019 0929   HDL 40 (L) 01/04/2019 0929   HDL 29 (L) 12/24/2016 1827   CHOLHDL 5.1 (H) 01/04/2019 0929   LDLCALC 146 (H) 01/04/2019 0929   LDL 117 mg/dL on 05/22/2020 per sharred record from [facility]   BP Readings from Last 3 Encounters:  11/26/19 (!) 150/85  09/21/19 116/63  09/07/19 137/71    Assessment: Review of patient past medical history, allergies, medications, health status, including review of consultants reports, laboratory and other test data, was performed as part of comprehensive evaluation and provision of chronic care management services.   SDOH:  (Social Determinants of Health) assessments and interventions performed: none   CCM Care Plan  Allergies  Allergen Reactions  . Penicillins Anaphylaxis    Has  patient had a PCN reaction causing immediate rash, facial/tongue/throat swelling, SOB or lightheadedness with hypotension: Yes Has patient had a PCN reaction causing severe rash involving mucus membranes or skin necrosis: No Has patient had a PCN reaction that required hospitalization: No Has patient had a PCN reaction occurring within the last 10 years: No If all of the above answers are "NO", then may proceed with Cephalosporin use.   Throat swells  . Victoza [Liraglutide]     Severe nausea  . Vicodin [Hydrocodone-Acetaminophen] Rash    And hives    Medications Reviewed Today    Reviewed by [name], FNP (Family Nurse Practitioner) on 04/13/20 at [id number]  Med List Status: <None>  Medication Order Taking? Sig Documenting Provider Last Dose Status Informant  albuterol (VENTOLIN HFA) 108 (90 Base) MCG/ACT inhaler [id number] Yes INHALE 1-2 PUFFS INTO THE LUNGS EVERY 6 (SIX) HOURS AS NEEDED FOR WHEEZING OR SHORTNESS OF BREATH. [name], FNP Taking Active   Aspirin-Salicylamide-Caffeine [facility] HEADACHE POWDER PO) [id number] Yes Take 4 packets by mouth daily as needed (pain). [provider] Taking Active Self  atorvastatin (LIPITOR) 20 MG tablet [id number] Yes TAKE 1 TABLET (20 MG TOTAL) BY MOUTH DAILY AT 6 PM. Malfi, [name], FNP Taking Active   buPROPion (WELLBUTRIN XL) 150 MG 24 hr tablet [id number] Yes  [provider] Taking Active   clonazePAM (KLONOPIN) 0.5 MG tablet [id number] Yes Take 0.5 mg by mouth 2 (two) times daily as needed for anxiety (Sometimes takes 3 tablets  a day).  [provider] Taking Active Self           Med Note [name], [place]   Thu Sep 05, 2016  9:54 AM)    cloNIDine (CATAPRES) 0.1 MG tablet [id number]  Take 0.1 mg by mouth at bedtime. [provider]  Active   DULoxetine (CYMBALTA) 30 MG capsule [id number] Yes 1 capsule daily.  [provider] Taking Active   fluticasone (FLONASE) 50 MCG/ACT nasal spray [id number] Yes  Place 2 sprays into both nostrils daily. Malfi, [name], FNP Taking Active   Fluticasone-Umeclidin-Vilant (TRELEGY ELLIPTA) 100-62.5-25 MCG/INH AEPB [id number] Yes Inhale 1 puff into the lungs daily. [name], FNP Taking Active   furosemide (LASIX) 40 MG tablet [id number] Yes Take 40 mg by mouth daily.  [provider] Taking Active   gabapentin (NEURONTIN) 300 MG capsule [id number] Yes Take 300 mg capsule at bedtime [provider] Taking Active   gabapentin (NEURONTIN) 600 MG tablet [id number] Yes TAKE 1 TABLET BY MOUTH THREE TIMES A DAY Malfi, [name], FNP Taking Active   hydrOXYzine (VISTARIL) 25 MG capsule [id number] Yes Take 25-50 mg by mouth at bedtime as needed. [provider] Taking Active   medroxyPROGESTERone (PROVERA) 10 MG tablet [id number] Yes Take 1 tablet (10 mg total) by mouth daily. [name], NP Taking Active   metFORMIN (GLUCOPHAGE) 1000 MG tablet [id number] Yes TAKE ONE TABLET BY MOUTH 2 TIMES A DAY WITH A MEAL Malfi, [name], FNP Taking Active   omega-3 acid ethyl esters (LOVAZA) 1 g capsule [id number] Yes TAKE 1 CAPSULE (1 G TOTAL) BY MOUTH 2 (TWO) TIMES DAILY. [name], FNP Taking Active   OneTouch Delica Lancets 95K MISC [id number] Yes  [provider] Taking Active   [facility] VERIO test strip [id number] Yes  [provider] Taking Active   pantoprazole (PROTONIX) 40 MG tablet [id number] Yes TAKE 1 TABLET BY MOUTH EVERY DAY Malfi, [name], FNP Taking Active   TRULICITY 4.5 AL/9.3XT SOPN [id number] Yes Inject 0.5 mLs (4.5 mg total) subcutaneously every 7 (seven) days [provider] Taking Active   Med List Note [name], RN 06/25/18 2992): UDS 06-25-2018          Patient Active Problem List   Diagnosis Date Noted  . Urinary frequency 04/13/2020  . COVID-19 11/25/2019  . Shortness of breath 11/25/2019  . Visit for gynecologic examination 09/21/2019  . Dyspnea on exertion 09/07/2019  . Skin  lesion 09/07/2019  . Chronic bilateral low back pain with bilateral sciatica 07/23/2018  . Chronic SI joint pain 07/23/2018  . Anxiety and depression 07/23/2018  . Chronic pain syndrome 07/23/2018  . Pharmacologic therapy 07/23/2018  . Lumbar degenerative disc disease 07/23/2018  . Marijuana use 07/23/2018  . Foot pain, right 04/30/2016  . Depression, major, recurrent, moderate ([place]) 03/11/2016  . Neuropathy due to type 2 diabetes mellitus ([place]) - severe 12/19/2015  . Menorrhagia 09/28/2015  . Type 2 diabetes mellitus treated with insulin ([place]) 05/24/2015  . Back pain 05/24/2015    Conditions to be addressed/monitored: HLD and DMII  Care Plan : General Pharmacy (Adult)  Updates made by Vella Raring, RPH since 05/24/2020 12:00 AM    Problem: Disease Progression     Long-Range Goal: Disease Progression Prevented or Minimized   Start Date: 03/06/2020  Expected End Date: 06/04/2020  Recent Progress: On track  Priority: High  Note:   Current Barriers:  . Unable to self administer medications as  prescribed . Lack of blood sugar readings for clinical team  Pharmacist Clinical Goal(s):  [name] Kitchen Over the next 90 days, patient will achieve adherence to monitoring guidelines and medication adherence to achieve therapeutic efficacy. through collaboration with PharmD and provider.   Interventions: . Inter-disciplinary care team collaboration (see longitudinal plan of care) . 1:1 collaboration with Olin Hauser, DO regarding development and update of comprehensive plan of care as evidenced by provider attestation and co-signature . Perform chart review o Patient completed telemedicine visit with PCP on 1/13 for urinary frequency. Patient advised to take Macrobid 100mg  BID x 5 days and complete urine culture - Urine culture ordered, but not completed o Patient seen for Office Visit with Endocrinology on 2/21. Provider advised patient to:  - Continue Metformin 1000 mg twice  daily - Continue Trulicity to 4.5 mg weekly.  - Start Jardiance 25 mg daily - Monitor blood sugars twice daily, fasting each morning and again before bed - Bring blood sugar log and/or meter to every visit o Labs per sharred record from [place]: - A1C 8.1% (05/22/2020) - LDL 117 mg/dL (05/22/2020) . Today patient reports she completed 5 day course of Macrobid and urinary symptoms resolved  Type 2 Diabetes: . Uncontrolled; current treatment:  o Metformin 1000 mg twice daily o Trulicity 4.5 mg weekly on Sundays  o Jardiance 25 mg daily (reports started today) . Patient unable to recall recent blood sugar readings and denies keeping log of results, reports instead brings meter to Endocrinology appointments for download, but forgot for last appointment . Counsel patient to check blood sugars fasting each morning and before bed 2-3 nights/week as directed by Endocrinologist.  . Counsel on importance of well balanced meals and limiting carbohydrate portion sizes o Encourage patient to continue to work on hydrating with water rather than soda o Reports planning to try to eat more at home, rather than from restaurants, to have better control over what she eats . Counsel on role of exercise for controlling blood sugar o Patient reports will work on goal of increasing walking to 10 minutes/day x 5 days/week  Hyperlipidemia . Uncontrolled; current treatment:  o Atorvastatin 20 mg QHS o Lovaza 1 gram twice daily . Patient admits to missing dose of atorvastatin ~ 2 days/week . Counsel on importance of cholesterol control and adherence to atorvastatin . Counsel patient to take atorvastatin daily in the morning as reports adherence easier with morning doses . Counsel impact of exercise on cholesterol control o Patient reports will work on goal of increasing walking to 10 minutes/day x 5 days/week  Medication Adherence: . Reports obtained weekly pillbox through OTC benefit and  has started using this tool o Patient reports has also ordered upper arm blood pressure monitor through OTC benefit o Counsel on BP monitoring technique . Confirms using maintenance (Trelegy) inhaler daily as directed, rinsing mouth after each use and using rescue (albuterol) inhaler as needed as directed  Patient Goals/Self-Care Activities . Over the next 90 days, patient will:  o Focus on medication adherence by obtaining and using weekly pillbox as adherence tool o Check blood glucose in morning and at bedtime and bring meter to future appointments with Endocrinologist o Attend medical appointments as scheduled - Next appointment with Endocrinology scheduled for 05/22/2020 (lab work on 05/15/2020)  Follow Up Plan: Telephone follow up appointment with care management team member scheduled for: 07/17/2020 at 11:15 AM     Follow Up:  Patient agrees to Care Plan and  Follow-up.  Harlow Asa, PharmD, [place] [phone]

## 2020-05-24 NOTE — Patient Instructions (Signed)
Visit Information  PATIENT GOALS: Goals Addressed            This Visit's Progress   . Pharmacy Goals       Our goal A1c is less than 7%. This corresponds with fasting sugars less than 130 and 2 hour after meal sugars less than 180. Please check your blood sugar each morning and again before bed 2-3 nights per week.  Please bring blood sugar log and/or meter to every visit with Endocrinologist  Our goal bad cholesterol, or LDL, is less than 70 . This is why it is important to continue taking your atorvastatin every morning  Feel free to call me with any questions or concerns. I look forward to our next call!  Harlow Asa, PharmD, [place] [phone]       The patient verbalized understanding of instructions, educational materials, and care plan provided today and declined offer to receive copy of patient instructions, educational materials, and care plan.   Telephone follow up appointment with care management team member scheduled for:  07/17/2020 at 11:15 AM  Harlow Asa, PharmD, [place] [phone]

## 2020-06-07 ENCOUNTER — Other Ambulatory Visit: Payer: Self-pay | Admitting: Family Medicine

## 2020-06-07 MED ORDER — FLUTICASONE PROPIONATE 50 MCG/ACT NA SUSP
2.0000 | Freq: Every day | NASAL | 1 refills | Status: DC
Start: 2020-06-07 — End: 2020-08-18

## 2020-06-07 NOTE — Telephone Encounter (Signed)
Medication Refill - Medication: fluticasone (FLONASE) 50 MCG/ACT nasal spray   Has the patient contacted their pharmacy? Yes.   (Agent: If no, request that the patient contact the pharmacy for the refill.) (Agent: If yes, when and what did the pharmacy advise?) call pcp/ request sent with no response   Preferred Pharmacy (with phone number or street name): [id number] [place], [place] [address]  [address] [place] [address]  Phone:  [phone] Fax:  [phone]   Agent: Please be advised that RX refills may take up to 3 business days. We ask that you follow-up with your pharmacy.

## 2020-06-07 NOTE — Telephone Encounter (Signed)
   Notes to clinic: Last filled by Cyndia Skeeters Review for refill  Will not let us escribe under Kathrine Haddock who patient has appt with    Requested Prescriptions  Pending Prescriptions Disp Refills   fluticasone (FLONASE) 50 MCG/ACT nasal spray 15.8 mL 1    Sig: Place 2 sprays into both nostrils daily.      Ear, Nose, and Throat: Nasal Preparations - Corticosteroids Passed - 06/07/2020  1:43 PM      Passed - Valid encounter within last 12 months    Recent Outpatient Visits           1 month ago Urinary frequency   [place], FNP   6 months ago [facility], [name], FNP   8 months ago Screening for cervical cancer   [facility], [name], FNP   9 months ago Type 2 diabetes mellitus with complication, with long-term current use of insulin [facility])   [facility], [name], FNP   1 year ago Type 2 diabetes mellitus with complication, with long-term current use of insulin [facility])   [place], Devonne Doughty, DO       Future Appointments             In 6 days Kathrine Haddock, NP [facility], [facility]

## 2020-06-12 ENCOUNTER — Telehealth: Payer: Medicaid Other

## 2020-06-12 ENCOUNTER — Telehealth: Payer: Self-pay | Admitting: General Practice

## 2020-06-12 NOTE — Telephone Encounter (Signed)
  Chronic Care Management   Outreach Note  06/12/2020 Name: [name] MRN: [id number] DOB: 12-May-1965  Referred by: [name], FNP Reason for referral : Appointment (RNCM: Follow up for Chronic Disease Management and Care Coordination Needs )   An unsuccessful telephone outreach was attempted today. The patient was referred to the case management team for assistance with care management and care coordination.   Follow Up Plan: The care management team will reach out to the patient again over the next 30 days.   Noreene Larsson RN, MSN, [place] Mobile: [phone]

## 2020-06-13 ENCOUNTER — Other Ambulatory Visit: Payer: Self-pay

## 2020-06-13 ENCOUNTER — Encounter: Payer: Self-pay | Admitting: Unknown Physician Specialty

## 2020-06-13 ENCOUNTER — Ambulatory Visit (INDEPENDENT_AMBULATORY_CARE_PROVIDER_SITE_OTHER): Payer: Medicare HMO | Admitting: Unknown Physician Specialty

## 2020-06-13 VITALS — BP 117/61 | HR 71 | Temp 97.5°F | Resp 18 | Ht 61.0 in | Wt 136.8 lb

## 2020-06-13 DIAGNOSIS — R634 Abnormal weight loss: Secondary | ICD-10-CM

## 2020-06-13 DIAGNOSIS — R5383 Other fatigue: Secondary | ICD-10-CM

## 2020-06-13 NOTE — Progress Notes (Addendum)
BP 117/61 (BP Location: Left Arm, Patient Position: Sitting, Cuff Size: Normal)   Pulse 71   Temp (!) 97.5 F (36.4 C) (Temporal)   Resp 18   Ht 5\' 1"  (1.549 m)   Wt 136 lb 12.8 oz (62.1 kg)   SpO2 100%   BMI 25.85 kg/m    Subjective:    Patient ID: Brandi Calderon, female    DOB: Sep 09, 1965, 55 y.o.   MRN: [id number]  HPI: Brandi Calderon is a 55 y.o. female  Chief Complaint  Patient presents with  . Fatigue    Pt notice intermittent fatigues that requires her to have to rest more frequent   . Medication Refill    Requesting refills on flonase, also mammogram order    Depression screen [facility] 2/9 11/04/2019 09/07/2019 09/07/2019 06/02/2019 01/04/2019  Decreased Interest 1 2 0 3 0  Down, Depressed, Hopeless 1 2 0 2 0  PHQ - 2 Score 2 4 0 5 0  Altered sleeping 3 3 - 3 3  Tired, decreased energy 3 3 - 3 1  Change in appetite 2 3 - 3 1  Feeling bad or failure about yourself  2 2 - 2 0  Trouble concentrating 3 3 - 3 0  Moving slowly or fidgety/restless 3 3 - 3 0  Suicidal thoughts 0 0 - 0 0  PHQ-9 Score 18 21 - 22 5  Difficult doing work/chores Not difficult at all Not difficult at all - Very difficult Somewhat difficult   Sees a therapist.  Taking Cymbalta now.  Endorses poor sleep  Fatigue Pt states she is having trouble with being tired and fatigued.  This is ongoing for 2 months.  States this is daily.  States she does get SOB.  Uses her inhaler twice or 3 times/day.  Diabetes not to goal.  Sees endocrine with Hgb A1C 8.1 3 weeks ago.  CMP was otherwise normal.  Denies dark stool, constipation or diarrhea.  Smokes "a lot."    Does admit to having Covid a while back and getting mab.   Relevant past medical, surgical, family and social history reviewed and updated as indicated. Interim medical history since our last visit reviewed. Allergies and medications reviewed and updated.  Review of Systems  Constitutional: Positive for fatigue.  Respiratory: Positive for shortness  of breath. Negative for wheezing.   Gastrointestinal: Negative for constipation, diarrhea and nausea.  Endocrine: Negative for polydipsia, polyphagia and polyuria.  Neurological: Negative.     Per HPI unless specifically indicated above     Objective:    BP 117/61 (BP Location: Left Arm, Patient Position: Sitting, Cuff Size: Normal)   Pulse 71   Temp (!) 97.5 F (36.4 C) (Temporal)   Resp 18   Ht 5\' 1"  (1.549 m)   Wt 136 lb 12.8 oz (62.1 kg)   SpO2 100%   BMI 25.85 kg/m   Wt Readings from Last 3 Encounters:  06/13/20 136 lb 12.8 oz (62.1 kg)  09/21/19 147 lb (66.7 kg)  09/07/19 145 lb (65.8 kg)    Physical Exam Constitutional:      General: She is not in acute distress.    Appearance: She is well-developed. She is ill-appearing.  HENT:     Head: Normocephalic and atraumatic.     Right Ear: Tympanic membrane normal.     Left Ear: Tympanic membrane normal.  Eyes:     General: Lids are normal. No scleral icterus.  Right eye: No discharge.        Left eye: No discharge.     Conjunctiva/sclera: Conjunctivae normal.  Neck:     Vascular: No carotid bruit or JVD.  Cardiovascular:     Rate and Rhythm: Normal rate and regular rhythm.     Heart sounds: Normal heart sounds.  Pulmonary:     Effort: No respiratory distress.     Breath sounds: Normal breath sounds.  Abdominal:     Palpations: There is no hepatomegaly or splenomegaly.  Musculoskeletal:        General: Normal range of motion.     Cervical back: Normal range of motion and neck supple.  Skin:    General: Skin is warm and dry.     Coloration: Skin is pale. Skin is not jaundiced.     Findings: No rash.  Neurological:     Mental Status: She is oriented to person, place, and time.  Psychiatric:        Behavior: Behavior normal.        Thought Content: Thought content normal.        Judgment: Judgment normal.     Results for orders placed or performed in visit on 03/14/20  HM DIABETES EYE EXAM  Result  Value Ref Range   HM Diabetic Eye Exam No Retinopathy No Retinopathy      Assessment & Plan:   Problem List Items Addressed This Visit   None   Visit Diagnoses    Fatigue, unspecified type    -  Primary   2 months.  Labs [facility] 3 weeks ago without acute changes other than poorly controlled DM Hgb A1C 8.8.  Add chest x-ray, TSH, CBC.  Recheck 1 week   Relevant Orders   CBC with Differential/Platelet   TSH   Weight loss       Weight loss is concerning. Will get chest x-ray.  LDCT.  ? related to Trulicity   Relevant Orders   DG Chest 2 View      Note addended to reflect that weight loss is likely related to SGLT2 rather than systemic process.    Follow up plan: RTC 1 week

## 2020-06-14 ENCOUNTER — Encounter: Payer: Self-pay | Admitting: *Deleted

## 2020-06-14 ENCOUNTER — Telehealth: Payer: Self-pay | Admitting: Unknown Physician Specialty

## 2020-06-14 ENCOUNTER — Ambulatory Visit
Admission: RE | Admit: 2020-06-14 | Discharge: 2020-06-14 | Disposition: A | Discharge status: Home / Self Care | Payer: Medicare HMO | Source: Ambulatory Visit | Attending: Unknown Physician Specialty | Admitting: Unknown Physician Specialty

## 2020-06-14 DIAGNOSIS — R634 Abnormal weight loss: Secondary | ICD-10-CM | POA: Insufficient documentation

## 2020-06-14 LAB — CBC WITH DIFFERENTIAL/PLATELET
Absolute Monocytes: 643 cells/uL (ref 200–950)
Basophils Absolute: 61 cells/uL (ref 0–200)
Basophils Relative: 0.4 %
Eosinophils Absolute: 107 cells/uL (ref 15–500)
Eosinophils Relative: 0.7 %
HCT: 43.9 % (ref 35.0–45.0)
Hemoglobin: 14.6 g/dL (ref 11.7–15.5)
Lymphs Abs: 3045 cells/uL (ref 850–3900)
MCH: 29.9 pg (ref 27.0–33.0)
MCHC: 33.3 g/dL (ref 32.0–36.0)
MCV: 90 fL (ref 80.0–100.0)
MPV: 9.6 fL (ref 7.5–12.5)
Monocytes Relative: 4.2 %
Neutro Abs: 11444 cells/uL — ABNORMAL HIGH (ref 1500–7800)
Neutrophils Relative %: 74.8 %
Platelets: 317 10*3/uL (ref 140–400)
RBC: 4.88 10*6/uL (ref 3.80–5.10)
RDW: 13.7 % (ref 11.0–15.0)
Total Lymphocyte: 19.9 %
WBC: 15.3 10*3/uL — ABNORMAL HIGH (ref 3.8–10.8)

## 2020-06-14 LAB — TSH: TSH: 0.92 mIU/L

## 2020-06-14 MED ORDER — DOXYCYCLINE HYCLATE 100 MG PO TABS: 100.0000 mg | ORAL_TABLET | Freq: Two times a day (BID) | ORAL | 0 refills | Status: DC

## 2020-06-14 NOTE — Telephone Encounter (Signed)
Called pt and discussed elevated WBC.  ? Pneumonia.  Will look at results of chest x-ray and rx Doxycycline.

## 2020-06-16 ENCOUNTER — Telehealth: Payer: Self-pay

## 2020-06-16 NOTE — Telephone Encounter (Signed)
Copied from [place] [id number]. Topic: Quick Communication - Other Results (Clinic Use ONLY) >> Jun 16, 2020 10:35 AM Scherrie Gerlach wrote: Pt calling for results of chest x rayon 3/16

## 2020-06-16 NOTE — Telephone Encounter (Signed)
I called the patient and notified her of her chest xray results. I also informed her that Malachy Mood requested her to continue on her antibiotic. [facility]. They told her it will not be available until Wednesday, March 23rd. I informed the patient that we need to either switch pharmacies or have [facility] in [facility]. She said she will contact them and follow back up with [place].

## 2020-06-19 ENCOUNTER — Telehealth: Payer: Self-pay | Admitting: *Deleted

## 2020-06-19 DIAGNOSIS — Z122 Encounter for screening for malignant neoplasm of respiratory organs: Secondary | ICD-10-CM

## 2020-06-19 DIAGNOSIS — Z87891 Personal history of nicotine dependence: Secondary | ICD-10-CM

## 2020-06-19 DIAGNOSIS — F17200 Nicotine dependence, unspecified, uncomplicated: Secondary | ICD-10-CM

## 2020-06-19 NOTE — Telephone Encounter (Signed)
Received referral for initial lung cancer screening scan. Contacted patient and obtained smoking history,(current, 99 pack year) as well as answering questions related to screening process. Patient denies signs of lung cancer such as weight loss or hemoptysis. Patient denies comorbidity that would prevent curative treatment if lung cancer were found. Patient is scheduled for shared decision making visit and CT scan on 06/27/20 130pm.

## 2020-06-21 ENCOUNTER — Telehealth: Payer: Self-pay | Admitting: Family Medicine

## 2020-06-21 MED ORDER — FUROSEMIDE 40 MG PO TABS
40.0000 mg | ORAL_TABLET | Freq: Every day | ORAL | 1 refills | Status: DC
Start: 2020-06-21 — End: 2020-07-16

## 2020-06-21 NOTE — Telephone Encounter (Signed)
Pt was advised by her pharmacy to call the office due to no response to their refill request/ Pt is not sure of names of meds she is needing/ Pt only asked for refill for furosemide (LASIX) 40 MG tablet / please advise asap

## 2020-06-22 NOTE — Telephone Encounter (Signed)
Patient has been rescheduled.

## 2020-06-22 NOTE — Telephone Encounter (Signed)
Please reschedule SG RN CM

## 2020-06-26 DIAGNOSIS — F4000 Agoraphobia, unspecified: Secondary | ICD-10-CM | POA: Diagnosis not present

## 2020-06-26 DIAGNOSIS — F331 Major depressive disorder, recurrent, moderate: Secondary | ICD-10-CM | POA: Diagnosis not present

## 2020-06-26 DIAGNOSIS — F4010 Social phobia, unspecified: Secondary | ICD-10-CM | POA: Diagnosis not present

## 2020-06-26 DIAGNOSIS — F321 Major depressive disorder, single episode, moderate: Secondary | ICD-10-CM | POA: Diagnosis not present

## 2020-06-26 DIAGNOSIS — R69 Illness, unspecified: Secondary | ICD-10-CM | POA: Diagnosis not present

## 2020-06-26 DIAGNOSIS — F411 Generalized anxiety disorder: Secondary | ICD-10-CM | POA: Diagnosis not present

## 2020-06-27 ENCOUNTER — Ambulatory Visit: Payer: Medicare HMO

## 2020-06-27 ENCOUNTER — Inpatient Hospital Stay: Payer: Medicare HMO | Admitting: Nurse Practitioner

## 2020-06-27 ENCOUNTER — Ambulatory Visit (INDEPENDENT_AMBULATORY_CARE_PROVIDER_SITE_OTHER): Payer: Medicare HMO | Admitting: Licensed Clinical Social Worker

## 2020-06-27 ENCOUNTER — Encounter: Payer: Self-pay | Admitting: Nurse Practitioner

## 2020-06-27 DIAGNOSIS — F32A Depression, unspecified: Secondary | ICD-10-CM | POA: Diagnosis not present

## 2020-06-27 DIAGNOSIS — E785 Hyperlipidemia, unspecified: Secondary | ICD-10-CM

## 2020-06-27 DIAGNOSIS — F331 Major depressive disorder, recurrent, moderate: Secondary | ICD-10-CM

## 2020-06-27 DIAGNOSIS — E1169 Type 2 diabetes mellitus with other specified complication: Secondary | ICD-10-CM | POA: Diagnosis not present

## 2020-06-27 DIAGNOSIS — Z794 Long term (current) use of insulin: Secondary | ICD-10-CM | POA: Diagnosis not present

## 2020-06-27 DIAGNOSIS — R69 Illness, unspecified: Secondary | ICD-10-CM | POA: Diagnosis not present

## 2020-06-27 DIAGNOSIS — E118 Type 2 diabetes mellitus with unspecified complications: Secondary | ICD-10-CM | POA: Diagnosis not present

## 2020-06-27 DIAGNOSIS — F419 Anxiety disorder, unspecified: Secondary | ICD-10-CM

## 2020-06-27 NOTE — Progress Notes (Signed)
Received referral for initial lung cancer screening scan. Patient has 99 pack year smoking history, currently smokes. Patient denies signs of lung cancer such as weight loss or hemoptysis. Patient denies comorbidity that would prevent curative treatment if lung cancer were found. Insurance denied the LDCT scan. Let this information serve that patient meets criteria for this screening exam.

## 2020-06-27 NOTE — Chronic Care Management (AMB) (Signed)
Chronic Care Management    Clinical Social Work Note  06/27/2020 Name: Brandi Calderon MRN: [id number] DOB: [birth year]  Brandi Calderon is a 55 y.o. year old female who is a primary care patient of [name], [name], FNP. The CCM team was consulted to assist the patient with chronic disease management and/or care coordination needs related to: Mental Health Counseling and Resources and Grief Counseling.   Engaged with patient by telephone for follow up visit in response to provider referral for social work chronic care management and care coordination services.   Consent to Services:  The patient was given the following information about Chronic Care Management services today, agreed to services, and gave verbal consent: 1. CCM service includes personalized support from designated clinical staff supervised by the primary care provider, including individualized plan of care and coordination with other care providers 2. 24/7 contact phone numbers for assistance for urgent and routine care needs. 3. Service will only be billed when office clinical staff spend 20 minutes or more in a month to coordinate care. 4. Only one practitioner may furnish and bill the service in a calendar month. 5.The patient may stop CCM services at any time (effective at the end of the month) by phone call to the office staff. 6. The patient will be responsible for cost sharing (co-pay) of up to 20% of the service fee (after annual deductible is met). Patient agreed to services and consent obtained.  Patient agreed to services and consent obtained.   Assessment: Review of patient past medical history, allergies, medications, and health status, including review of relevant consultants reports was performed today as part of a comprehensive evaluation and provision of chronic care management and care coordination services.     SDOH (Social Determinants of Health) assessments and interventions performed:    Advanced  Directives Status: See Care Plan for related entries.  CCM Care Plan  Allergies  Allergen Reactions  . Penicillins Anaphylaxis    Has patient had a PCN reaction causing immediate rash, facial/tongue/throat swelling, SOB or lightheadedness with hypotension: Yes Has patient had a PCN reaction causing severe rash involving mucus membranes or skin necrosis: No Has patient had a PCN reaction that required hospitalization: No Has patient had a PCN reaction occurring within the last 10 years: No If all of the above answers are "NO", then may proceed with Cephalosporin use.   Throat swells  . Victoza [Liraglutide]     Severe nausea  . Vicodin [Hydrocodone-Acetaminophen] Rash    And hives    Outpatient Encounter Medications as of 06/27/2020  Medication Sig  . albuterol (VENTOLIN HFA) 108 (90 Base) MCG/ACT inhaler INHALE 1-2 PUFFS INTO THE LUNGS EVERY 6 (SIX) HOURS AS NEEDED FOR WHEEZING OR SHORTNESS OF BREATH.  [name] Kitchen Aspirin-Salicylamide-Caffeine (BC HEADACHE POWDER PO) Take 4 packets by mouth daily as needed (pain).  [name] Kitchen atorvastatin (LIPITOR) 20 MG tablet TAKE 1 TABLET (20 MG TOTAL) BY MOUTH DAILY AT 6 PM.  . buPROPion (WELLBUTRIN XL) 150 MG 24 hr tablet   . clonazePAM (KLONOPIN) 0.5 MG tablet Take 0.5 mg by mouth 2 (two) times daily as needed for anxiety (Sometimes takes 3 tablets a day).   . cloNIDine (CATAPRES) 0.1 MG tablet Take 0.1 mg by mouth at bedtime.  [name] Kitchen doxycycline (VIBRA-TABS) 100 MG tablet Take 1 tablet (100 mg total) by mouth 2 (two) times daily.  . DULoxetine (CYMBALTA) 30 MG capsule 1 capsule daily.   . fluticasone (FLONASE) 50 MCG/ACT nasal spray Place 2 sprays  into both nostrils daily.  . Fluticasone-Umeclidin-Vilant (TRELEGY ELLIPTA) 100-62.5-25 MCG/INH AEPB Inhale 1 puff into the lungs daily.  . furosemide (LASIX) 40 MG tablet Take 1 tablet (40 mg total) by mouth daily.  [name] Kitchen gabapentin (NEURONTIN) 300 MG capsule Take by mouth at bedtime.  . gabapentin (NEURONTIN) 600 MG tablet  TAKE 1 TABLET BY MOUTH THREE TIMES A DAY  . hydrOXYzine (VISTARIL) 25 MG capsule Take 25-50 mg by mouth at bedtime as needed.  [name] Kitchen JARDIANCE 25 MG TABS tablet Take 25 mg by mouth daily.  . medroxyPROGESTERone (PROVERA) 10 MG tablet Take 1 tablet (10 mg total) by mouth daily.  . metFORMIN (GLUCOPHAGE) 1000 MG tablet TAKE ONE TABLET BY MOUTH 2 TIMES A DAY WITH A MEAL  . omega-3 acid ethyl esters (LOVAZA) 1 g capsule TAKE 1 CAPSULE (1 G TOTAL) BY MOUTH 2 (TWO) TIMES DAILY.  [name] Lancets 15A MISC   . ONETOUCH VERIO test strip   . pantoprazole (PROTONIX) 40 MG tablet TAKE 1 TABLET BY MOUTH EVERY DAY  . TRULICITY 4.5 VW/9.7XY SOPN Inject 0.5 mLs (4.5 mg total) subcutaneously every 7 (seven) days   No facility-administered encounter medications on file as of 06/27/2020.    Patient Active Problem List   Diagnosis Date Noted  . Hyperlipidemia associated with type 2 diabetes mellitus ([place]) 05/24/2020  . Urinary frequency 04/13/2020  . COVID-19 11/25/2019  . Dyspnea on exertion 09/07/2019  . Chronic bilateral low back pain with bilateral sciatica 07/23/2018  . Chronic SI joint pain 07/23/2018  . Anxiety and depression 07/23/2018  . Chronic pain syndrome 07/23/2018  . Lumbar degenerative disc disease 07/23/2018  . Marijuana use 07/23/2018  . Depression, major, recurrent, moderate ([place]) 03/11/2016  . Neuropathy due to type 2 diabetes mellitus ([place]) - severe 12/19/2015  . Type 2 diabetes mellitus with other specified complication ([place]) 80/16/5537    Conditions to be addressed/monitored: Anxiety and Depression; Mental Health Concerns  and Social Isolation  Care Plan : General Social Work (Adult)  Updates made by Greg Cutter, LCSW since 06/27/2020 12:00 AM    Problem: Response to Treatment (Depression)     Long-Range Goal: Response to Treatment Maximized   Start Date: 05/23/2020  Priority: Medium  Note:    Timeframe:  Long-Range Goal Priority:  Medium  Start Date:  05/23/20                          Expected End Date:  08/20/20                    Follow Up Date- 08/09/20  Current Barriers:  . Chronic Mental Health needs related to Depression, Anxiety and Grief . Limited social support . Mental Health Concerns  . Social Isolation . Loss of child in 2016 . Suicidal Ideation/Homicidal Ideation: No  Clinical Social Work Goal(s):  [name] Kitchen Over the next 120 days, patient will work with SW to address concerns related to care coordination needs and lack of education/support/resource connection. LCSW will assist patient in gaining community resource education and additional support and resource connection as well in order to maintain health and mental health appropriately  . Over the next 120 days, patient will demonstrate improved adherence to self care as evidenced by implementing healthy self-care into her daily routine such as: attending all medical appointments, deep breathing exercises, taking time for self-reflection, taking medications as prescribed, drinking water and daily exercise to improve mobility and mood.  [name] Kitchen  Over the next 120 days, patient will work with SW bi-monthly by telephone or in person to reduce or manage symptoms related to stress, anxiety and grief. . Over the next 120 days, patient will demonstrate improved health management independence as evidenced by implementing healthy self-care skills and positive support/resources into her daily routine to help cope with stressors and improve overall health and well-being  . Over the next 120 days, patient or caregiver will verbalize basic understanding of depression/stress process and self health management plan as evidenced by her participation in development of long term plan of care and institution of self health management strategies  Interventions: . Patient interviewed and appropriate assessments performed: brief mental health assessment . Patient was informed that current CCM LCSW will be leaving position next  month and her next CCM Social Work follow up visit will be with another LCSW. Patient was appreciative of support provided and receptive to news . CCM LCSW spoke with patient's daughter and patient on 05/23/20. She reports that Niomi's son's death anniversary was on 06-13-2020 (he was killed in 2016) and they went to his grave and shared happy memories of son together. Patient was able to decorate his grave with supplies from [employer]. She reports that patient is getting "stronger everyday" and is on the right path. She shares that she continues to check on her mother daily and transports her to all of her appointments. Patient did not answer phone call today as daughter had just dropped her off to get her hair done. Update 06/27/20- Patient reports that she is managing her mental health symptoms "as best as I can." She reports that she went to her son's grave this week and put up new flowers which lifted her mood. Patient reports that her insomnia has increased recently and she was unable to sleep last night. She reports that she has been up since 4 am this morning and her body is aching from sleep deprivation. LCSW provided education on healthy sleep hygiene and what that looks like. LCSW encouraged patient to implement a night time routine into her schedule that works best for her and that she is able to maintain. Advised patient to implement deep breathing/grounding/meditation/self-care exercises into her nightly routine to combat racing thoughts at night. LCSW encouraged patient to wake up at the same time each day, make her sleeping environment comfortable, exercise when able, limit naps and to not eat or drink anything right before bed.  . Patient had a recent lung infection and is hopeful that it has been resolved as she is almost done with her 20 day supply of antibiotics  . Provided mental health counseling with regard to coping with loss. LCSW educated patient and family on available mental  health and grief support resources within her nearby area. Patient reports that she does not wish to gain grief therapy at this time as she enjoys talking with CCM LCSW instead. Patient has a psychiatrist at [facility] and her psychiatrist has suggested that she consider counseling as well but she has declined as she is comfortable with her current level of support at this time. LCSW asked her to consider this resource implementation if her grief increases. Patient agreeable to this plan.  . Provided patient with information about healthy self-care that she can implement into her daily routine to combat grief as well as to improve her health/mood.  . Discussed plans with patient for ongoing care management follow up and provided patient with direct contact information for care  management team . Advised patient to contact CCM LCSW for any urgent social work related issues. . Assisted patient/caregiver with obtaining information about health plan benefits . Provided education and assistance to client regarding Advanced Directives. . Provided education to patient/caregiver about Hospice and/or Palliative Care services . Encouraged patient to consider a mental health provider for long term follow up and therapy/counseling for grief support. . Brief CBT provided as patient is dealing with multiple stressors. Patient was educated on ways to combat her negative thinking patterns. . Positive reinforcement provided as patient has been actively working on her self-care by talking and connecting with her daughters, eating well, spending time outside grounding herself while looking and listening to nature. Patient reports that she enjoys listening to her birds outside on her front porch every morning.  . Patient has been concerned about her father who has advanced dementia and resides at [facility]. Patient reports that her father is not eating and she is worried about him. Emotional support provided for  this stressor. Patient visits her father weekly at [facility] facility. Stress management education provided as well. . Patient ordered a blood pressure monitor and recently received new eyeglasses and a pulse oximeter.   Patient Self Care Activities:  . Self administers medications as prescribed . Attends all scheduled provider appointments . Calls provider office for new concerns or questions . Ability for insight . Independent living . Motivation for treatment . Strong family or social support  Patient Coping Strengths:  . Supportive Relationships . Spirituality . Hopefulness . Self Advocate . Able to Communicate Effectively  Patient Self Care Deficits:  . Lacks social connections  Please see past updates related to this goal by clicking on the "Past Updates" button in the selected goal       Follow Up Plan: SW will follow up with patient by phone over the next quarter      [name], [place], MSW, [email or web address] Phone: [phone]

## 2020-06-27 NOTE — Patient Instructions (Signed)
Licensed Clinical Social Worker Visit Information  Goals we discussed today:  Goals Addressed            This Visit's Progress   . SW-Manage My Emotions        Timeframe:  Long-Range Goal Priority:  Medium  Start Date:  05/23/20                         Expected End Date:  08/20/20                    Follow Up Date- 08/09/20  Current Barriers:  . Chronic Mental Health needs related to Depression, Anxiety and Grief . Limited social support . Mental Health Concerns  . Social Isolation . Loss of child in 2016 . Suicidal Ideation/Homicidal Ideation: No  Clinical Social Work Goal(s):  [name] Kitchen Over the next 120 days, patient will work with SW to address concerns related to care coordination needs and lack of education/support/resource connection. LCSW will assist patient in gaining community resource education and additional support and resource connection as well in order to maintain health and mental health appropriately  . Over the next 120 days, patient will demonstrate improved adherence to self care as evidenced by implementing healthy self-care into her daily routine such as: attending all medical appointments, deep breathing exercises, taking time for self-reflection, taking medications as prescribed, drinking water and daily exercise to improve mobility and mood.  . Over the next 120 days, patient will work with SW bi-monthly by telephone or in person to reduce or manage symptoms related to stress, anxiety and grief. . Over the next 120 days, patient will demonstrate improved health management independence as evidenced by implementing healthy self-care skills and positive support/resources into her daily routine to help cope with stressors and improve overall health and well-being  . Over the next 120 days, patient or caregiver will verbalize basic understanding of depression/stress process and self health management plan as evidenced by her participation in development of long term plan of  care and institution of self health management strategies  Interventions: . Patient interviewed and appropriate assessments performed: brief mental health assessment . Patient was informed that current CCM LCSW will be leaving position next month and her next CCM Social Work follow up visit will be with another LCSW. Patient was appreciative of support provided and receptive to news . CCM LCSW spoke with patient's daughter and patient on 05/23/20. She reports that Rumi's son's death anniversary was on 2020/05/18 (he was killed in 2016) and they went to his grave and shared happy memories of son together. Patient was able to decorate his grave with supplies from [employer]. She reports that patient is getting "stronger everyday" and is on the right path. She shares that she continues to check on her mother daily and transports her to all of her appointments. Patient did not answer phone call today as daughter had just dropped her off to get her hair done. Update 06/27/20- Patient reports that she is managing her mental health symptoms "as best as I can." She reports that she went to her son's grave this week and put up new flowers which lifted her mood. Patient reports that her insomnia has increased recently and she was unable to sleep last night. She reports that she has been up since 4 am this morning and her body is aching from sleep deprivation. LCSW provided education on healthy sleep hygiene and what that looks  like. LCSW encouraged patient to implement a night time routine into her schedule that works best for her and that she is able to maintain. Advised patient to implement deep breathing/grounding/meditation/self-care exercises into her nightly routine to combat racing thoughts at night. LCSW encouraged patient to wake up at the same time each day, make her sleeping environment comfortable, exercise when able, limit naps and to not eat or drink anything right before bed.  . Patient had a  recent lung infection and is hopeful that it has been resolved as she is almost done with her 20 day supply of antibiotics  . Provided mental health counseling with regard to coping with loss. LCSW educated patient and family on available mental health and grief support resources within her nearby area. Patient reports that she does not wish to gain grief therapy at this time as she enjoys talking with CCM LCSW instead. Patient has a psychiatrist at [facility] and her psychiatrist has suggested that she consider counseling as well but she has declined as she is comfortable with her current level of support at this time. LCSW asked her to consider this resource implementation if her grief increases. Patient agreeable to this plan.  . Provided patient with information about healthy self-care that she can implement into her daily routine to combat grief as well as to improve her health/mood.  . Discussed plans with patient for ongoing care management follow up and provided patient with direct contact information for care management team . Advised patient to contact CCM LCSW for any urgent social work related issues. . Assisted patient/caregiver with obtaining information about health plan benefits . Provided education and assistance to client regarding Advanced Directives. . Provided education to patient/caregiver about Hospice and/or Palliative Care services . Encouraged patient to consider a mental health provider for long term follow up and therapy/counseling for grief support. . Brief CBT provided as patient is dealing with multiple stressors. Patient was educated on ways to combat her negative thinking patterns. . Positive reinforcement provided as patient has been actively working on her self-care by talking and connecting with her daughters, eating well, spending time outside grounding herself while looking and listening to nature. Patient reports that she enjoys listening to her birds outside on her front  porch every morning.  . Patient has been concerned about her father who has advanced dementia and resides at [facility]. Patient reports that her father is not eating and she is worried about him. Emotional support provided for this stressor. Patient visits her father weekly at [facility] facility. Stress management education provided as well. . Patient ordered a blood pressure monitor and recently received new eyeglasses and a pulse oximeter.   Patient Self Care Activities:  . Self administers medications as prescribed . Attends all scheduled provider appointments . Calls provider office for new concerns or questions . Ability for insight . Independent living . Motivation for treatment . Strong family or social support  Patient Coping Strengths:  . Supportive Relationships . Spirituality . Hopefulness . Self Advocate . Able to Communicate Effectively  Patient Self Care Deficits:  . Lacks social connections  Please see past updates related to this goal by clicking on the "Past Updates" button in the selected goal         [name], [place], MSW, [email or web address] Phone: [phone]

## 2020-06-30 ENCOUNTER — Other Ambulatory Visit: Payer: Self-pay | Admitting: Family Medicine

## 2020-07-03 ENCOUNTER — Telehealth: Payer: Medicare HMO | Admitting: General Practice

## 2020-07-03 ENCOUNTER — Ambulatory Visit (INDEPENDENT_AMBULATORY_CARE_PROVIDER_SITE_OTHER): Payer: Medicare HMO | Admitting: General Practice

## 2020-07-03 DIAGNOSIS — F32A Depression, unspecified: Secondary | ICD-10-CM

## 2020-07-03 DIAGNOSIS — F331 Major depressive disorder, recurrent, moderate: Secondary | ICD-10-CM

## 2020-07-03 DIAGNOSIS — Z794 Long term (current) use of insulin: Secondary | ICD-10-CM

## 2020-07-03 DIAGNOSIS — E118 Type 2 diabetes mellitus with unspecified complications: Secondary | ICD-10-CM

## 2020-07-03 DIAGNOSIS — R0602 Shortness of breath: Secondary | ICD-10-CM

## 2020-07-03 NOTE — Chronic Care Management (AMB) (Signed)
Chronic Care Management   CCM RN Visit Note  07/03/2020 Name: Brandi Calderon MRN: [id number] DOB: 05/08/1965  Subjective: Brandi Calderon is a 55 y.o. year old female who is a primary care patient of Brandi Calderon, Brandi Raider, FNP. The care management team was consulted for assistance with disease management and care coordination needs.    Engaged with patient by telephone for follow up visit in response to provider referral for case management and/or care coordination services.   Consent to Services:  The patient was given information about Chronic Care Management services, agreed to services, and gave verbal consent prior to initiation of services.  Please see initial visit note for detailed documentation.   Patient agreed to services and verbal consent obtained.   Assessment: Review of patient past medical history, allergies, medications, health status, including review of consultants reports, laboratory and other test data, was performed as part of comprehensive evaluation and provision of chronic care management services.   SDOH (Social Determinants of Health) assessments and interventions performed:    CCM Care Plan  Allergies  Allergen Reactions  . Penicillins Anaphylaxis    Has patient had a PCN reaction causing immediate rash, facial/tongue/throat swelling, SOB or lightheadedness with hypotension: Yes Has patient had a PCN reaction causing severe rash involving mucus membranes or skin necrosis: No Has patient had a PCN reaction that required hospitalization: No Has patient had a PCN reaction occurring within the last 10 years: No If all of the above answers are "NO", then may proceed with Cephalosporin use.   Throat swells  . Victoza [Liraglutide]     Severe nausea  . Vicodin [Hydrocodone-Acetaminophen] Rash    And hives    Outpatient Encounter Medications as of 07/03/2020  Medication Sig  . albuterol (VENTOLIN HFA) 108 (90 Base) MCG/ACT inhaler INHALE 1-2 PUFFS INTO THE  LUNGS EVERY 6 (SIX) HOURS AS NEEDED FOR WHEEZING OR SHORTNESS OF BREATH.  [name] Kitchen Aspirin-Salicylamide-Caffeine (BC HEADACHE POWDER PO) Take 4 packets by mouth daily as needed (pain).  [name] Kitchen atorvastatin (LIPITOR) 20 MG tablet TAKE 1 TABLET (20 MG TOTAL) BY MOUTH DAILY AT 6 PM.  . buPROPion (WELLBUTRIN XL) 150 MG 24 hr tablet   . clonazePAM (KLONOPIN) 0.5 MG tablet Take 0.5 mg by mouth 2 (two) times daily as needed for anxiety (Sometimes takes 3 tablets a day).   . cloNIDine (CATAPRES) 0.1 MG tablet Take 0.1 mg by mouth at bedtime.  [name] Kitchen doxycycline (VIBRA-TABS) 100 MG tablet Take 1 tablet (100 mg total) by mouth 2 (two) times daily.  . DULoxetine (CYMBALTA) 30 MG capsule 1 capsule daily.   . fluticasone (FLONASE) 50 MCG/ACT nasal spray Place 2 sprays into both nostrils daily.  . Fluticasone-Umeclidin-Vilant (TRELEGY ELLIPTA) 100-62.5-25 MCG/INH AEPB Inhale 1 puff into the lungs daily.  . furosemide (LASIX) 40 MG tablet Take 1 tablet (40 mg total) by mouth daily.  [name] Kitchen gabapentin (NEURONTIN) 300 MG capsule Take by mouth at bedtime.  . gabapentin (NEURONTIN) 600 MG tablet TAKE 1 TABLET BY MOUTH THREE TIMES A DAY  . hydrOXYzine (VISTARIL) 25 MG capsule Take 25-50 mg by mouth at bedtime as needed.  [name] Kitchen JARDIANCE 25 MG TABS tablet Take 25 mg by mouth daily.  . medroxyPROGESTERone (PROVERA) 10 MG tablet Take 1 tablet (10 mg total) by mouth daily.  . metFORMIN (GLUCOPHAGE) 1000 MG tablet TAKE ONE TABLET BY MOUTH 2 TIMES A DAY WITH A MEAL  . omega-3 acid ethyl esters (LOVAZA) 1 g capsule TAKE 1 CAPSULE (1 G TOTAL)  BY MOUTH 2 (TWO) TIMES DAILY.  Brandi Calderon Lancets 78E MISC   . ONETOUCH VERIO test strip   . pantoprazole (PROTONIX) 40 MG tablet TAKE 1 TABLET BY MOUTH EVERY DAY  . TRULICITY 4.5 UM/3.5TI SOPN Inject 0.5 mLs (4.5 mg total) subcutaneously every 7 (seven) days   No facility-administered encounter medications on file as of 07/03/2020.    Patient Active Problem List   Diagnosis Date Noted  .  Hyperlipidemia associated with type 2 diabetes mellitus ([place]) 05/24/2020  . Urinary frequency 04/13/2020  . COVID-19 11/25/2019  . Dyspnea on exertion 09/07/2019  . Chronic bilateral low back pain with bilateral sciatica 07/23/2018  . Chronic SI joint pain 07/23/2018  . Anxiety and depression 07/23/2018  . Chronic pain syndrome 07/23/2018  . Lumbar degenerative disc disease 07/23/2018  . Marijuana use 07/23/2018  . Depression, major, recurrent, moderate ([place]) 03/11/2016  . Neuropathy due to type 2 diabetes mellitus ([place]) - severe 12/19/2015  . Type 2 diabetes mellitus with other specified complication ([place]) 14/43/1540    Conditions to be addressed/monitored:COPD, DMII, Anxiety and Depression  Care Plan : RNCM: COPD (Adult)  Updates made by Vanita Ingles since 07/03/2020 12:00 AM    Problem: RNCM: Psychological Adjustment to Diagnosis (COPD)   Priority: High    Goal: RNCM: COPD Adjustment to Disease Achieved   Note:   Current Barriers:  [name] Kitchen Knowledge deficits related to basic understanding of COPD disease process . Knowledge deficits related to basic COPD self care/management . Knowledge deficit related to basic understanding of how to use inhalers and how inhaled medications work . Knowledge deficit related to importance of energy conservation . Limited Social Support . Unable to independently manage COPD . Lacks social connections . Does not contact provider office for questions/concerns  Case Manager Clinical Goal(s):  Over the next 120 days patient will report using inhalers as prescribed including rinsing mouth after use  Over the next 120 days patient will report utilizing pursed lip breathing for shortness of breath  Over the next 120 days, patient will be able to verbalize understanding of COPD action plan and when to seek appropriate levels of medical care  Over the next 120 days, patient will engage in lite exercise as tolerated to build/regain stamina and strength and  reduce shortness of breath through activity tolerance  Over the next 120 days, patient will verbalize basic understanding of COPD disease process and self care activities  Over the next 120 days, patient will not be hospitalized for COPD exacerbation  Interventions:  . Collaboration with Malfi, Brandi Raider, FNP regarding development and update of comprehensive plan of care as evidenced by provider attestation and co-signature . Inter-disciplinary care team collaboration (see longitudinal plan of care)  Provided patient with basic written and verbal COPD education on self care/management/and exacerbation prevention. 07-03-2020: The patient is waiting for her MRI. They cancelled it because they had to get approval from her insurance. She is calling today to see about a reschedule. The patient wants to find out because she saw pcp on 06-13-2020 and it was felt she had an infection. She states she is feeling the same and feels like the symptoms she has is related to COVID that she had >6 months ago. She states she stays tired and she has no energy and she doesn't know what is going on. Denies any COPD exacerbations at this time.   Provided patient with COPD action plan and reinforced importance of daily self assessment  Discussed Pulmonary  Rehab and offered to assist with referral placement  Provided written and verbal instructions on pursed lip breathing and utilized returned demonstration as teach back  Provided instruction about proper use of medications used for management of COPD including inhalers  Advised patient to self assesses COPD action plan zone and make appointment with provider if in the yellow zone for 48 hours without improvement.  Provided patient with education about the role of exercise in the management of COPD  Advised patient to engage in light exercise as tolerated 3-5 days a week  Provided education about and advised patient to utilize infection prevention strategies to reduce  risk of respiratory infection  Patient Goals/Self-Care Activities:  . - caregiver stress acknowledged . - caregiver support provided . - counseling provided . - decision-making supported . - depression screen reviewed . - emotional support provided . - problem-solving facilitated . - verbalization of feelings encouraged Follow Up Plan: Telephone follow up appointment with care management team member scheduled for: 09-18-2020 at 0945 am.   Care Plan : RNCM: Diabetes Type 2 (Adult)  Updates made by Vanita Ingles since 07/03/2020 12:00 AM    Problem: RNCM: Glycemic Management (Diabetes, Type 2)   Priority: Medium    Long-Range Goal: RNCM: Glycemic Management Optimized   Priority: Medium  Note:   Objective:  Lab Results  Component Value Date   HGBA1C 7.7 (A) 09/07/2019   HGBA1C 7.7 09/07/2019   HGBA1C 7.7 (A) 09/07/2019    . Hemoglobin A1C at endocrinology 12-23-2019 was 8.9, Hemoglobin A1C 05-2020: 8.8- results form [facility] Clinical Lab Results  Component Value Date   CREATININE 0.81 09/07/2019   CREATININE 0.74 01/04/2019   CREATININE 0.77 10/10/2017 .   [name] Kitchen No results found for: EGFR Current Barriers:  [name] Kitchen Knowledge Deficits related to basic Diabetes pathophysiology and self care/management . Knowledge Deficits related to medications used for management of diabetes . Limited Social Support . Unable to independently manage DM . Unable to self administer medications as prescribed . Does not contact provider office for questions/concerns Case Manager Clinical Goal(s):  [name] Kitchen Collaboration with Malfi, Brandi Raider, FNP regarding development and update of comprehensive plan of care as evidenced by provider attestation and co-signature . Inter-disciplinary care team collaboration (see longitudinal plan of care) . Over the next 120 days, patient will demonstrate improved adherence to prescribed treatment plan for diabetes self care/management as evidenced by:  . daily monitoring and recording  of CBG  . adherence to ADA/ carb modified diet . adherence to prescribed medication regimen Interventions:  . Provided education to patient about basic DM disease process . Reviewed medications with patient and discussed importance of medication adherence . Discussed plans with patient for ongoing care management follow up and provided patient with direct contact information for care management team . Provided patient with written educational materials related to hypo and hyperglycemia and importance of correct treatment. 07-03-2020: The patient states that she has not had any new hypoglycemic events. The patient denies any since the episode several months ago when she was riding with her daughter. The patient states that she thinks her provider adding the Lasix has helped her with her blood sugars also. Education and support given. The patient states that her blood sugar this am was 145.  [name] Kitchen Review of patient status, including review of consultants reports, relevant laboratory and other test results, and medications completed. Patient Goals/Self-Care Activities . Over the next 120 days, patient will:  - UNABLE to independently manage DM Self administers  oral medications as prescribed Attends all scheduled provider appointments Checks blood sugars as prescribed and utilize hyper and hypoglycemia protocol as needed Adheres to prescribed ADA/carb modified - barriers to adherence to treatment plan identified - blood glucose monitoring encouraged - blood glucose readings reviewed - resources required to improve adherence to care identified - self-awareness of signs/symptoms of hypo or hyperglycemia encouraged - use of blood glucose monitoring log promoted Follow Up Plan: Telephone follow up appointment with care management team member scheduled for: 09-18-2020 at 0945 am   Care Plan : RNCM: Depression (Adult)  Updates made by Vanita Ingles since 07/03/2020 12:00 AM    Problem: RNCM: Depression  Identification (Depression)   Priority: Medium    Long-Range Goal: RNCM: Depressive and anxiety  Symptoms Identified   Priority: Medium  Note:   Current Barriers:  . Chronic Disease Management support and education needs related to depression and anxiety . Lacks caregiver support.  . Unable to independently manage depression . Lacks social connections . Does not contact provider office for questions/concerns  Nurse Case Manager Clinical Goal(s):  [name] Kitchen Over the next 120 days, patient will verbalize understanding of plan for effective management of anxiety and depression  . Over the next 120 days, patient will work with [facility] team and pcp to address needs related to depression and anxiety exacerbations . Over the next 120 days, the patient will demonstrate ongoing self health care management ability as evidenced by taking medications as directed, following the plan of care and working with the CCM team to optimize health and well being.   Interventions:  . 1:1 collaboration with Malfi, Brandi Raider, FNP regarding development and update of comprehensive plan of care as evidenced by provider attestation and co-signature . Inter-disciplinary care team collaboration (see longitudinal plan of care) . Evaluation of current treatment plan related to anxiety and depression  and patient's adherence to plan as established by provider. 07-03-2020: The patient states she is doing okay. She has moments when she gets very anxious about things but she does what she can to calm herself down. She denies any acute distress at this time. Has a gym membership and doing well with this. She is going 2 days a week. Her daughter plans to get a gym membership so she can go with her. Will continue to monitor for changes.  . Advised patient to call the office for changes in mood, anxiety, or depression . Provided education to patient re: talking to [facility] when she is in a depressed state, working with CCM team during  exacerbations  . Discussed plans with patient for ongoing care management follow up and provided patient with direct contact information for care management team  Patient Goals/Self-Care Activities Over the next 120 days, patient will:  - Patient will attend all scheduled provider appointments Patient will call pharmacy for medication refills Patient will attend church or other social activities Patient will call provider office for new concerns or questions Patient will work with BSW to address care coordination needs and will continue to work with the clinical team to address health care and disease management related needs.   - anxiety screen reviewed - depression screen reviewed - medication list reviewed  Follow Up Plan: Telephone follow up appointment with care management team member scheduled for: 09-18-2020 at 0945 am         Plan:Telephone follow up appointment with care management team member scheduled for:  09-18-2020 at [place] am  [place], MSN, CCM Community  [place] Mobile: [phone]

## 2020-07-03 NOTE — Patient Instructions (Signed)
Visit Information  PATIENT GOALS: Patient Care Plan: General Pharmacy (Adult)    Problem Identified: Disease Progression     Long-Range Goal: Disease Progression Prevented or Minimized   Start Date: 03/06/2020  Expected End Date: 06/04/2020  Recent Progress: On track  Priority: High  Note:   Current Barriers:  . Unable to self administer medications as prescribed . Lack of blood sugar readings for clinical team  Pharmacist Clinical Goal(s):  [name] Kitchen Over the next 90 days, patient will achieve adherence to monitoring guidelines and medication adherence to achieve therapeutic efficacy. through collaboration with PharmD and provider.   Interventions: . Inter-disciplinary care team collaboration (see longitudinal plan of care) . 1:1 collaboration with Olin Hauser, DO regarding development and update of comprehensive plan of care as evidenced by provider attestation and co-signature . Perform chart review o Patient completed telemedicine visit with PCP on 1/13 for urinary frequency. Patient advised to take Macrobid 124m BID x 5 days and complete urine culture - Urine culture ordered, but not completed o Patient seen for Office Visit with Endocrinology on 2/21. Provider advised patient to:  - Continue Metformin 1000 mg twice daily - Continue Trulicity to 4.5 mg weekly.  - Start Jardiance 25 mg daily - Monitor blood sugars twice daily, fasting each morning and again before bed - Bring blood sugar log and/or meter to every visit o Labs per sharred record from [facility] - A1C 8.1% (05/22/2020) - LDL 117 mg/dL (05/22/2020) . Today patient reports she completed 5 day course of Macrobid and urinary symptoms resolved  Type 2 Diabetes: . Uncontrolled; current treatment:  o Metformin 1000 mg twice daily o Trulicity 4.5 mg weekly on Sundays  o Jardiance 25 mg daily (reports started today) . Patient unable to recall recent blood sugar readings and denies keeping log  of results, reports instead brings meter to Endocrinology appointments for download, but forgot for last appointment . Counsel patient to check blood sugars fasting each morning and before bed 2-3 nights/week as directed by Endocrinologist.  . Counsel on importance of well balanced meals and limiting carbohydrate portion sizes o Encourage patient to continue to work on hydrating with water rather than soda o Reports planning to try to eat more at home, rather than from restaurants, to have better control over what she eats . Counsel on role of exercise for controlling blood sugar o Patient reports will work on goal of increasing walking to 10 minutes/day x 5 days/week  Hyperlipidemia . Uncontrolled; current treatment:  o Atorvastatin 20 mg QHS o Lovaza 1 gram twice daily . Patient admits to missing dose of atorvastatin ~ 2 days/week . Counsel on importance of cholesterol control and adherence to atorvastatin . Counsel patient to take atorvastatin daily in the morning as reports adherence easier with morning doses . Counsel impact of exercise on cholesterol control o Patient reports will work on goal of increasing walking to 10 minutes/day x 5 days/week  Medication Adherence: . Reports obtained weekly pillbox through OTC benefit and has started using this tool o Patient reports has also ordered upper arm blood pressure monitor through OTC benefit o Counsel on BP monitoring technique . Confirms using maintenance (Trelegy) inhaler daily as directed, rinsing mouth after each use and using rescue (albuterol) inhaler as needed as directed  Patient Goals/Self-Care Activities . Over the next 90 days, patient will:  o Focus on medication adherence by obtaining and using weekly pillbox as adherence tool o Check blood glucose in morning and  at bedtime and bring meter to future appointments with Endocrinologist o Attend medical appointments as scheduled - Next appointment with Endocrinology  scheduled for 05/22/2020 (lab work on 05/15/2020)  Follow Up Plan: Telephone follow up appointment with care management team member scheduled for: 07/17/2020 at 11:15 AM   Task: Alleviate Barriers to Chronic Kidney Disease Treatment   Note:   Patient Care Plan: General Social Work (Adult)    Problem Identified: Response to Treatment (Depression)     Long-Range Goal: Response to Treatment Maximized   Start Date: 05/23/2020  Priority: Medium  Note:    Timeframe:  Long-Range Goal Priority:  Medium  Start Date:  05/23/20                         Expected End Date:  08/20/20                    Follow Up Date- 08/09/20  Current Barriers:  . Chronic Mental Health needs related to Depression, Anxiety and Grief . Limited social support . Mental Health Concerns  . Social Isolation . Loss of child in 2016 . Suicidal Ideation/Homicidal Ideation: No  Clinical Social Work Goal(s):  [name] Kitchen Over the next 120 days, patient will work with SW to address concerns related to care coordination needs and lack of education/support/resource connection. LCSW will assist patient in gaining community resource education and additional support and resource connection as well in order to maintain health and mental health appropriately  . Over the next 120 days, patient will demonstrate improved adherence to self care as evidenced by implementing healthy self-care into her daily routine such as: attending all medical appointments, deep breathing exercises, taking time for self-reflection, taking medications as prescribed, drinking water and daily exercise to improve mobility and mood.  . Over the next 120 days, patient will work with SW bi-monthly by telephone or in person to reduce or manage symptoms related to stress, anxiety and grief. . Over the next 120 days, patient will demonstrate improved health management independence as evidenced by implementing healthy self-care skills and positive support/resources into her daily  routine to help cope with stressors and improve overall health and well-being  . Over the next 120 days, patient or caregiver will verbalize basic understanding of depression/stress process and self health management plan as evidenced by her participation in development of long term plan of care and institution of self health management strategies  Interventions: . Patient interviewed and appropriate assessments performed: brief mental health assessment . Patient was informed that current CCM LCSW will be leaving position next month and her next CCM Social Work follow up visit will be with another LCSW. Patient was appreciative of support provided and receptive to news . CCM LCSW spoke with patient's daughter and patient on 05/23/20. She reports that Keyana's son's death anniversary was on 05-18-2020 (he was killed in 2016) and they went to his grave and shared happy memories of son together. Patient was able to decorate his grave with supplies from [employer]. She reports that patient is getting "stronger everyday" and is on the right path. She shares that she continues to check on her mother daily and transports her to all of her appointments. Patient did not answer phone call today as daughter had just dropped her off to get her hair done. Update 06/27/20- Patient reports that she is managing her mental health symptoms "as best as I can." She reports that she went to  her son's grave this week and put up new flowers which lifted her mood. Patient reports that her insomnia has increased recently and she was unable to sleep last night. She reports that she has been up since 4 am this morning and her body is aching from sleep deprivation. LCSW provided education on healthy sleep hygiene and what that looks like. LCSW encouraged patient to implement a night time routine into her schedule that works best for her and that she is able to maintain. Advised patient to implement deep  breathing/grounding/meditation/self-care exercises into her nightly routine to combat racing thoughts at night. LCSW encouraged patient to wake up at the same time each day, make her sleeping environment comfortable, exercise when able, limit naps and to not eat or drink anything right before bed.  . Patient had a recent lung infection and is hopeful that it has been resolved as she is almost done with her 20 day supply of antibiotics  . Provided mental health counseling with regard to coping with loss. LCSW educated patient and family on available mental health and grief support resources within her nearby area. Patient reports that she does not wish to gain grief therapy at this time as she enjoys talking with CCM LCSW instead. Patient has a psychiatrist at [facility] and her psychiatrist has suggested that she consider counseling as well but she has declined as she is comfortable with her current level of support at this time. LCSW asked her to consider this resource implementation if her grief increases. Patient agreeable to this plan.  . Provided patient with information about healthy self-care that she can implement into her daily routine to combat grief as well as to improve her health/mood.  . Discussed plans with patient for ongoing care management follow up and provided patient with direct contact information for care management team . Advised patient to contact CCM LCSW for any urgent social work related issues. . Assisted patient/caregiver with obtaining information about health plan benefits . Provided education and assistance to client regarding Advanced Directives. . Provided education to patient/caregiver about Hospice and/or Palliative Care services . Encouraged patient to consider a mental health provider for long term follow up and therapy/counseling for grief support. . Brief CBT provided as patient is dealing with multiple stressors. Patient was educated on ways to combat her negative  thinking patterns. . Positive reinforcement provided as patient has been actively working on her self-care by talking and connecting with her daughters, eating well, spending time outside grounding herself while looking and listening to nature. Patient reports that she enjoys listening to her birds outside on her front porch every morning.  . Patient has been concerned about her father who has advanced dementia and resides at [place]. Patient reports that her father is not eating and she is worried about him. Emotional support provided for this stressor. Patient visits her father weekly at [place] facility. Stress management education provided as well. . Patient ordered a blood pressure monitor and recently received new eyeglasses and a pulse oximeter.   Patient Self Care Activities:  . Self administers medications as prescribed . Attends all scheduled provider appointments . Calls provider office for new concerns or questions . Ability for insight . Independent living . Motivation for treatment . Strong family or social support  Patient Coping Strengths:  . Supportive Relationships . Spirituality . Hopefulness . Self Advocate . Able to Communicate Effectively  Patient Self Care Deficits:  . Lacks social connections  Please  see past updates related to this goal by clicking on the "Past Updates" button in the selected goal    Task: Facilitate Engagement in [place]   Note:   Care Management Activities:    - barriers to treatment reviewed and addressed - re-screen for depressive symptoms performed - perceived benefits to therapy discussed - risk of unmanaged depression discussed    Notes:    Patient Care Plan: RNCM: COPD (Adult)    Problem Identified: RNCM: Psychological Adjustment to Diagnosis (COPD)   Priority: High    Goal: RNCM: COPD Adjustment to Disease Achieved   Note:   Current Barriers:  [name] Kitchen Knowledge deficits related to basic  understanding of COPD disease process . Knowledge deficits related to basic COPD self care/management . Knowledge deficit related to basic understanding of how to use inhalers and how inhaled medications work . Knowledge deficit related to importance of energy conservation . Limited Social Support . Unable to independently manage COPD . Lacks social connections . Does not contact provider office for questions/concerns  Case Manager Clinical Goal(s):  Over the next 120 days patient will report using inhalers as prescribed including rinsing mouth after use  Over the next 120 days patient will report utilizing pursed lip breathing for shortness of breath  Over the next 120 days, patient will be able to verbalize understanding of COPD action plan and when to seek appropriate levels of medical care  Over the next 120 days, patient will engage in lite exercise as tolerated to build/regain stamina and strength and reduce shortness of breath through activity tolerance  Over the next 120 days, patient will verbalize basic understanding of COPD disease process and self care activities  Over the next 120 days, patient will not be hospitalized for COPD exacerbation  Interventions:  . Collaboration with Malfi, [name], FNP regarding development and update of comprehensive plan of care as evidenced by provider attestation and co-signature . Inter-disciplinary care team collaboration (see longitudinal plan of care)  Provided patient with basic written and verbal COPD education on self care/management/and exacerbation prevention. 07-03-2020: The patient is waiting for her MRI. They cancelled it because they had to get approval from her insurance. She is calling today to see about a reschedule. The patient wants to find out because she saw pcp on 06-13-2020 and it was felt she had an infection. She states she is feeling the same and feels like the symptoms she has is related to COVID that she had >6 months  ago. She states she stays tired and she has no energy and she doesn't know what is going on. Denies any COPD exacerbations at this time.   Provided patient with COPD action plan and reinforced importance of daily self assessment  Discussed Pulmonary Rehab and offered to assist with referral placement  Provided written and verbal instructions on pursed lip breathing and utilized returned demonstration as teach back  Provided instruction about proper use of medications used for management of COPD including inhalers  Advised patient to self assesses COPD action plan zone and make appointment with provider if in the yellow zone for 48 hours without improvement.  Provided patient with education about the role of exercise in the management of COPD  Advised patient to engage in light exercise as tolerated 3-5 days a week  Provided education about and advised patient to utilize infection prevention strategies to reduce risk of respiratory infection  Patient Goals/Self-Care Activities:  . - caregiver stress acknowledged . - caregiver support provided . -  counseling provided . - decision-making supported . - depression screen reviewed . - emotional support provided . - problem-solving facilitated . - verbalization of feelings encouraged Follow Up Plan: Telephone follow up appointment with care management team member scheduled for: 09-18-2020 at 0945 am.   Task: RNCM: Support Psychosocial Response to Chronic Obstructive Pulmonary Disease   Note:   Care Management Activities:    - caregiver stress acknowledged - caregiver support provided - counseling provided - decision-making supported - depression screen reviewed - emotional support provided - problem-solving facilitated - verbalization of feelings encouraged       Patient Care Plan: RNCM: Diabetes Type 2 (Adult)    Problem Identified: RNCM: Glycemic Management (Diabetes, Type 2)   Priority: Medium    Long-Range Goal: RNCM:  Glycemic Management Optimized   Priority: Medium  Note:   Objective:  Lab Results  Component Value Date   HGBA1C 7.7 (A) 09/07/2019   HGBA1C 7.7 09/07/2019   HGBA1C 7.7 (A) 09/07/2019    . Hemoglobin A1C at endocrinology 12-23-2019 was 8.9, Hemoglobin A1C 05-2020: 8.8- results form [facility] Clinical Lab Results  Component Value Date   CREATININE 0.81 09/07/2019   CREATININE 0.74 01/04/2019   CREATININE 0.77 10/10/2017 .   [name] Kitchen No results found for: EGFR Current Barriers:  [name] Kitchen Knowledge Deficits related to basic Diabetes pathophysiology and self care/management . Knowledge Deficits related to medications used for management of diabetes . Limited Social Support . Unable to independently manage DM . Unable to self administer medications as prescribed . Does not contact provider office for questions/concerns Case Manager Clinical Goal(s):  [name] Kitchen Collaboration with Malfi, [name], FNP regarding development and update of comprehensive plan of care as evidenced by provider attestation and co-signature . Inter-disciplinary care team collaboration (see longitudinal plan of care) . Over the next 120 days, patient will demonstrate improved adherence to prescribed treatment plan for diabetes self care/management as evidenced by:  . daily monitoring and recording of CBG  . adherence to ADA/ carb modified diet . adherence to prescribed medication regimen Interventions:  . Provided education to patient about basic DM disease process . Reviewed medications with patient and discussed importance of medication adherence . Discussed plans with patient for ongoing care management follow up and provided patient with direct contact information for care management team . Provided patient with written educational materials related to hypo and hyperglycemia and importance of correct treatment. 07-03-2020: The patient states that she has not had any new hypoglycemic events. The patient denies any since the episode  several months ago when she was riding with her daughter. The patient states that she thinks her provider adding the Lasix has helped her with her blood sugars also. Education and support given. The patient states that her blood sugar this am was 145.  [name] Kitchen Review of patient status, including review of consultants reports, relevant laboratory and other test results, and medications completed. Patient Goals/Self-Care Activities . Over the next 120 days, patient will:  - UNABLE to independently manage DM Self administers oral medications as prescribed Attends all scheduled provider appointments Checks blood sugars as prescribed and utilize hyper and hypoglycemia protocol as needed Adheres to prescribed ADA/carb modified - barriers to adherence to treatment plan identified - blood glucose monitoring encouraged - blood glucose readings reviewed - resources required to improve adherence to care identified - self-awareness of signs/symptoms of hypo or hyperglycemia encouraged - use of blood glucose monitoring log promoted Follow Up Plan: Telephone follow up appointment with care management team  member scheduled for: 09-18-2020 at 0945 am   Task: RNCM: Alleviate Barriers to Glycemic Management   Note:   Care Management Activities:    - barriers to adherence to treatment plan identified - blood glucose monitoring encouraged - blood glucose readings reviewed - resources required to improve adherence to care identified - self-awareness of signs/symptoms of hypo or hyperglycemia encouraged - use of blood glucose monitoring log promoted       Patient Care Plan: RNCM: Depression (Adult)    Problem Identified: RNCM: Depression Identification (Depression)   Priority: Medium    Long-Range Goal: RNCM: Depressive and anxiety  Symptoms Identified   Priority: Medium  Note:   Current Barriers:  . Chronic Disease Management support and education needs related to depression and anxiety . Lacks caregiver  support.  . Unable to independently manage depression . Lacks social connections . Does not contact provider office for questions/concerns  Nurse Case Manager Clinical Goal(s):  [name] Kitchen Over the next 120 days, patient will verbalize understanding of plan for effective management of anxiety and depression  . Over the next 120 days, patient will work with [facility], [facility] team and pcp to address needs related to depression and anxiety exacerbations . Over the next 120 days, the patient will demonstrate ongoing self health care management ability as evidenced by taking medications as directed, following the plan of care and working with the CCM team to optimize health and well being.   Interventions:  . 1:1 collaboration with Malfi, [name], FNP regarding development and update of comprehensive plan of care as evidenced by provider attestation and co-signature . Inter-disciplinary care team collaboration (see longitudinal plan of care) . Evaluation of current treatment plan related to anxiety and depression  and patient's adherence to plan as established by provider. 07-03-2020: The patient states she is doing okay. She has moments when she gets very anxious about things but she does what she can to calm herself down. She denies any acute distress at this time. Has a gym membership and doing well with this. She is going 2 days a week. Her daughter plans to get a gym membership so she can go with her. Will continue to monitor for changes.  . Advised patient to call the office for changes in mood, anxiety, or depression . Provided education to patient re: talking to [facility] when she is in a depressed state, working with CCM team during exacerbations  . Discussed plans with patient for ongoing care management follow up and provided patient with direct contact information for care management team  Patient Goals/Self-Care Activities Over the next 120 days, patient will:  - Patient will attend all scheduled provider  appointments Patient will call pharmacy for medication refills Patient will attend church or other social activities Patient will call provider office for new concerns or questions Patient will work with BSW to address care coordination needs and will continue to work with the clinical team to address health care and disease management related needs.   - anxiety screen reviewed - depression screen reviewed - medication list reviewed  Follow Up Plan: Telephone follow up appointment with care management team member scheduled for: 09-18-2020 at 0945 am       Task: RNCM: Identify Depressive Symptoms and Facilitate Treatment   Note:   Care Management Activities:    - anxiety screen reviewed - depression screen reviewed - medication list reviewed         Patient verbalizes understanding of instructions provided today and agrees to  view in [place].   Telephone follow up appointment with care management team member scheduled for: 09-18-2020 at [place] am  Noreene Larsson RN, MSN, [place] Mobile: [phone]

## 2020-07-07 ENCOUNTER — Other Ambulatory Visit: Payer: Self-pay | Admitting: Unknown Physician Specialty

## 2020-07-07 DIAGNOSIS — R634 Abnormal weight loss: Secondary | ICD-10-CM

## 2020-07-12 ENCOUNTER — Telehealth: Payer: Self-pay

## 2020-07-12 NOTE — Telephone Encounter (Signed)
[facility] is going to deny the CT chest WO contrast because she already had this procedure done.

## 2020-07-16 ENCOUNTER — Other Ambulatory Visit: Payer: Self-pay | Admitting: Family Medicine

## 2020-07-16 NOTE — Telephone Encounter (Signed)
Requested Prescriptions  Pending Prescriptions Disp Refills  . furosemide (LASIX) 40 MG tablet [Pharmacy Med Name: FUROSEMIDE 40 MG TABLET] 30 tablet 1    Sig: TAKE 1 TABLET BY MOUTH EVERY DAY     Cardiovascular:  Diuretics - Loop Passed - 07/16/2020 12:31 PM      Passed - K in normal range and within 360 days    Potassium  Date Value Ref Range Status  09/07/2019 4.7 3.5 - 5.3 mmol/L Final  12/07/2012 3.4 (L) 3.5 - 5.1 mmol/L Final         Passed - Ca in normal range and within 360 days    Calcium  Date Value Ref Range Status  09/07/2019 9.8 8.6 - 10.4 mg/dL Final   Calcium, Total  Date Value Ref Range Status  12/07/2012 8.6 8.5 - 10.1 mg/dL Final   Calcium, Ion  Date Value Ref Range Status  05/09/2010 1.11 (L) 1.12 - 1.32 mmol/L Final         Passed - Na in normal range and within 360 days    Sodium  Date Value Ref Range Status  09/07/2019 141 135 - 146 mmol/L Final  06/25/2017 139 134 - 144 mmol/L Final  12/07/2012 134 (L) 136 - 145 mmol/L Final         Passed - Cr in normal range and within 360 days    Creat  Date Value Ref Range Status  09/07/2019 0.81 0.50 - 1.05 mg/dL Final    Comment:    For patients >77 years of age, the reference limit for Creatinine is approximately 13% higher for people identified as African-American. .          Passed - Last BP in normal range    BP Readings from Last 1 Encounters:  06/13/20 117/61         Passed - Valid encounter within last 6 months    Recent Outpatient Visits          1 month ago Fatigue, unspecified type   [place], NP   3 months ago Urinary frequency   [place], FNP   7 months ago [facility] [place], [name], FNP   9 months ago Screening for cervical cancer   [facility], [name], FNP   10 months ago Type 2 diabetes mellitus with complication, with long-term current use of insulin [facility])    [facility], [name], [place]

## 2020-07-17 ENCOUNTER — Telehealth: Payer: Self-pay | Admitting: Pharmacist

## 2020-07-17 ENCOUNTER — Other Ambulatory Visit: Payer: Self-pay

## 2020-07-17 ENCOUNTER — Ambulatory Visit: Payer: Medicare HMO | Admitting: Pharmacist

## 2020-07-17 DIAGNOSIS — F331 Major depressive disorder, recurrent, moderate: Secondary | ICD-10-CM | POA: Diagnosis not present

## 2020-07-17 DIAGNOSIS — F419 Anxiety disorder, unspecified: Secondary | ICD-10-CM | POA: Diagnosis not present

## 2020-07-17 DIAGNOSIS — R69 Illness, unspecified: Secondary | ICD-10-CM | POA: Diagnosis not present

## 2020-07-17 DIAGNOSIS — F32A Depression, unspecified: Secondary | ICD-10-CM | POA: Diagnosis not present

## 2020-07-17 DIAGNOSIS — Z794 Long term (current) use of insulin: Secondary | ICD-10-CM

## 2020-07-17 DIAGNOSIS — U071 COVID-19: Secondary | ICD-10-CM

## 2020-07-17 DIAGNOSIS — E118 Type 2 diabetes mellitus with unspecified complications: Secondary | ICD-10-CM

## 2020-07-17 DIAGNOSIS — R0602 Shortness of breath: Secondary | ICD-10-CM

## 2020-07-17 MED ORDER — ALBUTEROL SULFATE HFA 108 (90 BASE) MCG/ACT IN AERS: 1.0000 | INHALATION_SPRAY | Freq: Four times a day (QID) | RESPIRATORY_TRACT | 1 refills | Status: DC | PRN

## 2020-07-17 NOTE — Patient Instructions (Signed)
Visit Information  PATIENT GOALS: Goals Addressed            This Visit's Progress   . Pharmacy Goals       Our goal A1c is less than 7%. This corresponds with fasting sugars less than 130 and 2 hour after meal sugars less than 180. Please check your blood sugar each morning and again before bed 2-3 nights per week.  Please bring blood sugar log and/or meter to every visit with Endocrinologist  Our goal bad cholesterol, or LDL, is less than 70 . This is why it is important to continue taking your atorvastatin every morning  Feel free to call me with any questions or concerns. I look forward to our next call!   Harlow Asa, PharmD, [place] [phone]       The patient verbalized understanding of instructions, educational materials, and care plan provided today and declined offer to receive copy of patient instructions, educational materials, and care plan.   Telephone follow up appointment with care management team member scheduled for: 5/16 at 8:30 am  Harlow Asa, PharmD, [place], [place] [phone]

## 2020-07-17 NOTE — Telephone Encounter (Signed)
Patient requests renewal of albuterol inhaler Rx.   Would you please [facility]?  Thank you! Brandi Calderon

## 2020-07-17 NOTE — Telephone Encounter (Signed)
Yes. [facility] [place]

## 2020-07-17 NOTE — Chronic Care Management (AMB) (Signed)
Chronic Care Management Pharmacy Note  07/17/2020 Name:  Brandi Calderon MRN:  [id number] DOB:  04-03-1965  Subjective: Brandi Calderon is an 55 y.o. year old female who is a primary patient of Malfi, [name], FNP.  The CCM team was consulted for assistance with disease management and care coordination needs.    Engaged with patient by telephone for follow up visit in response to provider referral for pharmacy case management and/or care coordination services.   Consent to Services:  The patient was given information about Chronic Care Management services, agreed to services, and gave verbal consent prior to initiation of services.  Please see initial visit note for detailed documentation.   Patient Care Team: Malfi, [name], FNP as PCP - General (Family Medicine) Dr. Leonides Schanz (Psychiatry) Dr. Kenton Kingfisher (Gynecology) Tawni Millers, MD (Internal Medicine) Vanita Ingles, RN as Registered Nurse ([place]) [place], Virl Diamond, [place] as Pharmacist (Pharmacist) Greg Cutter, LCSW as Social Worker (Licensed Clinical Social Worker)  Recent office visits: Office Visit with Kathrine Haddock, NP on 3/15 for fatigue  Hospital visits: None in previous 6 months  Objective:  Lab Results  Component Value Date   CREATININE 0.81 09/07/2019   CREATININE 0.74 01/04/2019   CREATININE 0.77 10/10/2017    Lab Results  Component Value Date   HGBA1C 7.7 (A) 09/07/2019   HGBA1C 7.7 09/07/2019   HGBA1C 7.7 (A) 09/07/2019  A1C 8.1% on 05/22/2020 per sharred record from [place]   Last diabetic Eye exam:  Lab Results  Component Value Date/Time   HMDIABEYEEXA No Retinopathy 02/21/2020 12:00 AM    Last diabetic Foot exam: No results found for: HMDIABFOOTEX   Social History   Tobacco Use  Smoking Status Current Every Day Smoker  . Packs/day: 3.00  . Years: 33.00  . Pack years: 99.00  . Types: Cigarettes  Smokeless Tobacco Never Used   BP Readings from  Last 3 Encounters:  06/13/20 117/61  11/26/19 (!) 150/85  09/21/19 116/63   Pulse Readings from Last 3 Encounters:  06/13/20 71  11/26/19 67  09/21/19 74   Wt Readings from Last 3 Encounters:  06/13/20 136 lb 12.8 oz (62.1 kg)  09/21/19 147 lb (66.7 kg)  09/07/19 145 lb (65.8 kg)    Assessment: Review of patient past medical history, allergies, medications, health status, including review of consultants reports, laboratory and other test data, was performed as part of comprehensive evaluation and provision of chronic care management services.   SDOH:  (Social Determinants of Health) assessments and interventions performed: yes SDOH Interventions   Flowsheet Row Most Recent Value  SDOH Interventions   SDOH Interventions for the Following Domains Tobacco  Tobacco Interventions Other (Comment)  [Counsel patient on smoking cessation - patient interested in setting quit date at future appointment]      CCM Care Plan  Allergies  Allergen Reactions  . Penicillins Anaphylaxis    Has patient had a PCN reaction causing immediate rash, facial/tongue/throat swelling, SOB or lightheadedness with hypotension: Yes Has patient had a PCN reaction causing severe rash involving mucus membranes or skin necrosis: No Has patient had a PCN reaction that required hospitalization: No Has patient had a PCN reaction occurring within the last 10 years: No If all of the above answers are "NO", then may proceed with Cephalosporin use.   Throat swells  . Victoza [Liraglutide]     Severe nausea  . Vicodin [Hydrocodone-Acetaminophen] Rash    And hives  Medications Reviewed Today    Reviewed by Vanita Ingles on 07/03/20 at [phone]  Med List Status: <None>  Medication Order Taking? Sig Documenting Provider Last Dose Status Informant  albuterol (VENTOLIN HFA) 108 (90 Base) MCG/ACT inhaler [id number] No INHALE 1-2 PUFFS INTO THE LUNGS EVERY 6 (SIX) HOURS AS NEEDED FOR WHEEZING OR SHORTNESS OF BREATH.  [name], FNP Taking Active   Aspirin-Salicylamide-Caffeine [facility] HEADACHE POWDER PO) [id number] No Take 4 packets by mouth daily as needed (pain). [provider] Taking Active Self  atorvastatin (LIPITOR) 20 MG tablet [id number] No TAKE 1 TABLET (20 MG TOTAL) BY MOUTH DAILY AT 6 PM. Malfi, [name], FNP Taking Active   buPROPion (WELLBUTRIN XL) 150 MG 24 hr tablet [id number] No  [provider] Taking Active   clonazePAM (KLONOPIN) 0.5 MG tablet [id number] No Take 0.5 mg by mouth 2 (two) times daily as needed for anxiety (Sometimes takes 3 tablets a day).  [provider] Taking Active Self           Med Note [name], [place]   Thu Sep 05, 2016  9:54 AM)    cloNIDine (CATAPRES) 0.1 MG tablet [id number] No Take 0.1 mg by mouth at bedtime. [provider] Taking Active   doxycycline (VIBRA-TABS) 100 MG tablet [id number]  Take 1 tablet (100 mg total) by mouth 2 (two) times daily. Kathrine Haddock, NP  Active   DULoxetine (CYMBALTA) 30 MG capsule [id number] No 1 capsule daily.  [provider] Taking Active   fluticasone (FLONASE) 50 MCG/ACT nasal spray [id number] No Place 2 sprays into both nostrils daily. Olin Hauser, DO Taking Active   Fluticasone-Umeclidin-Vilant (TRELEGY ELLIPTA) 100-62.5-25 MCG/INH AEPB [id number] No Inhale 1 puff into the lungs daily. Malfi, [name], FNP Taking Active   furosemide (LASIX) 40 MG tablet [id number]  Take 1 tablet (40 mg total) by mouth daily. Karamalegos, Devonne Doughty, DO  Active   gabapentin (NEURONTIN) 300 MG capsule [id number] No Take by mouth at bedtime. [provider] Taking Active   gabapentin (NEURONTIN) 600 MG tablet [id number] No TAKE 1 TABLET BY MOUTH THREE TIMES A DAY Malfi, [name], FNP Taking Active   hydrOXYzine (VISTARIL) 25 MG capsule [id number] No Take 25-50 mg by mouth at bedtime as needed. [provider] Taking Active   JARDIANCE 25 MG TABS tablet [id number] No Take 25 mg by mouth  daily. [provider] Taking Active   medroxyPROGESTERone (PROVERA) 10 MG tablet [id number] No Take 1 tablet (10 mg total) by mouth daily. [name], NP Taking Active   metFORMIN (GLUCOPHAGE) 1000 MG tablet [id number] No TAKE ONE TABLET BY MOUTH 2 TIMES A DAY WITH A MEAL Malfi, [name], FNP Taking Active   omega-3 acid ethyl esters (LOVAZA) 1 g capsule [id number] No TAKE 1 CAPSULE (1 G TOTAL) BY MOUTH 2 (TWO) TIMES DAILY. [name], FNP Taking Active   OneTouch Delica Lancets 96Q MISC [id number] No  [provider] Taking Active   [facility] VERIO test strip [id number] No  [provider] Taking Active   pantoprazole (PROTONIX) 40 MG tablet [id number] No TAKE 1 TABLET BY MOUTH EVERY DAY Malfi, [name], FNP Taking Active   TRULICITY 4.5 SH/7.53YO SOPN [id number] No Inject 0.5 mLs (4.5 mg total) subcutaneously every 7 (seven) days [provider] Taking Active   Med List Note [name], RN 06/25/18 7741): UDS 06-25-2018          Patient Active Problem List  Diagnosis Date Noted  . Hyperlipidemia associated with type 2 diabetes mellitus ([place]) 05/24/2020  . Urinary frequency 04/13/2020  . COVID-19 11/25/2019  . Dyspnea on exertion 09/07/2019  . Chronic bilateral low back pain with bilateral sciatica 07/23/2018  . Chronic SI joint pain 07/23/2018  . Anxiety and depression 07/23/2018  . Chronic pain syndrome 07/23/2018  . Lumbar degenerative disc disease 07/23/2018  . Marijuana use 07/23/2018  . Depression, major, recurrent, moderate ([place]) 03/11/2016  . Neuropathy due to type 2 diabetes mellitus ([place]) - severe 12/19/2015  . Type 2 diabetes mellitus with other specified complication ([place]) 00/86/7619    Immunization History  Administered Date(s) Administered  . Influenza Inj Mdck Quad Pf 02/07/2016  . Influenza,inj,Quad PF,6+ Mos 01/04/2019  . Influenza-Unspecified 02/18/2017  . PFIZER Comirnaty([place])Covid-19 Tri-Sucrose Vaccine  11/10/2019    Conditions to be addressed/monitored: T2DM, HLD, tobacco use  Care Plan : General Pharmacy (Adult)  Updates made by Vella Raring, RPH-CPP since 07/17/2020 12:00 AM    Problem: Disease Progression     Long-Range Goal: Disease Progression Prevented or Minimized   Start Date: 03/06/2020  Expected End Date: 06/04/2020  This Visit's Progress: On track  Recent Progress: On track  Priority: High  Note:   Current Barriers:  . Unable to self administer medications as prescribed . Lack of blood sugar readings for clinical team  Pharmacist Clinical Goal(s):  [name] Kitchen Over the next 90 days, patient will achieve adherence to monitoring guidelines and medication adherence to achieve therapeutic efficacy. through collaboration with PharmD and provider.   Interventions: . Inter-disciplinary care team collaboration (see longitudinal plan of care) . 1:1 collaboration with Olin Hauser, DO regarding development and update of comprehensive plan of care as evidenced by provider attestation and co-signature . Perform chart review. Patient seen for Office Visit with Kathrine Haddock, NP on 3/15 for fatigue o Chest CT ordered (completed on 3/16) o On 3/16 provider prescribed doxycycline 100 mg twice daily for 10 days . Today reports completed course of doxycyline. Reports that fatigue is improved but plans to make follow up appointment with PCP . Unable to perform complete medication review today as patient not currently home.  Type 2 Diabetes: . Uncontrolled based on latest A1C result from Endocrinologist; current treatment:  o Metformin 1000 mg twice daily o Trulicity 4.5 mg weekly on Mondays o Jardiance 25 mg daily (reports started on 2/23) . Patient unable to recall recent blood sugar readings and denies keeping log of results, reports instead brings meter to Endocrinology appointments for download . Reports checking blood sugars fasting each morning and before bed 2-3  nights/week as directed by Endocrinologist.  . Reports blood sugar readings improved since started on Jardiance, readings primarily ranging ~140-150 . Have counseled on importance of well balanced meals and limiting carbohydrate portion size . Have counseled on role of exercise for controlling blood sugar . Blood pressure: reports ordered an upper arm blood pressure monitor through OTC benefit, but not yet monitoring o Counsel patient on blood pressure monitoring technique o Encourage patient to check blood pressure 1-2 times/week at home, keep a log and bring record with her to medical appointments  Medication Adherence: . Reports using weekly pillbox as adherence tool . Admits to occasionally taking Trulicity dose late as forgets to administer. Counsel to use weekly alarm on phone to aid with adherence o Reports daughter will help her setup this reminder alarm  . Confirms using maintenance (Trelegy) inhaler daily as directed, rinsing mouth after each  use and using rescue (albuterol) inhaler as needed as directed o Identify patient in need of refill of albuterol Rx and current Rx expired - will send message to request Rx renewal  Tobacco use: . Reports currently smoking: 3 packs/day . Interested in quitting smoking. Not ready to set quit date today, but reports interested in talking further with PCP as plans to schedule follow up visit . Reports family is her motivation for quitting. States her daughters have told her that they would quit smoking with her . Discuss smoking cessation medication therapies, including Chantix or combination nicotine replacement therapy o Patient reports will discuss further with PCP    Patient Goals/Self-Care Activities . Over the next 90 days, patient will:  o Focus on medication adherence by using weekly pillbox as adherence tool o Check blood glucose in morning and at bedtime and bring meter to future appointments with Endocrinologist o Attend medical  appointments as scheduled  Follow Up Plan: Telephone follow up appointment with care management team member scheduled for: 5/16 at 8:30 am     Medication Assistance: None required.  Patient affirms current coverage meets needs.  Patient's preferred pharmacy [facility]:  [id number] [place], [place] [address] [address] Phone: [phone] Fax: [phone]  Uses pill box? Yes  Follow Up:  Patient agrees to Care Plan and Follow-up.  Plan: Telephone follow up appointment with care management team member scheduled for:  5/16 at 8:30 am  Harlow Asa, PharmD, [place], [place] [phone]

## 2020-07-26 ENCOUNTER — Other Ambulatory Visit: Payer: Self-pay

## 2020-07-26 ENCOUNTER — Ambulatory Visit (INDEPENDENT_AMBULATORY_CARE_PROVIDER_SITE_OTHER): Payer: Medicare HMO | Admitting: Internal Medicine

## 2020-07-26 ENCOUNTER — Encounter: Payer: Self-pay | Admitting: Internal Medicine

## 2020-07-26 VITALS — BP 107/58 | HR 68 | Temp 97.3°F | Resp 18 | Ht 61.0 in | Wt 133.0 lb

## 2020-07-26 DIAGNOSIS — Z794 Long term (current) use of insulin: Secondary | ICD-10-CM

## 2020-07-26 DIAGNOSIS — G894 Chronic pain syndrome: Secondary | ICD-10-CM

## 2020-07-26 DIAGNOSIS — E1169 Type 2 diabetes mellitus with other specified complication: Secondary | ICD-10-CM | POA: Diagnosis not present

## 2020-07-26 DIAGNOSIS — D72828 Other elevated white blood cell count: Secondary | ICD-10-CM | POA: Diagnosis not present

## 2020-07-26 DIAGNOSIS — F17200 Nicotine dependence, unspecified, uncomplicated: Secondary | ICD-10-CM | POA: Diagnosis not present

## 2020-07-26 DIAGNOSIS — E1140 Type 2 diabetes mellitus with diabetic neuropathy, unspecified: Secondary | ICD-10-CM

## 2020-07-26 DIAGNOSIS — E118 Type 2 diabetes mellitus with unspecified complications: Secondary | ICD-10-CM | POA: Diagnosis not present

## 2020-07-26 DIAGNOSIS — J418 Mixed simple and mucopurulent chronic bronchitis: Secondary | ICD-10-CM

## 2020-07-26 DIAGNOSIS — Z8616 Personal history of COVID-19: Secondary | ICD-10-CM | POA: Diagnosis not present

## 2020-07-26 DIAGNOSIS — I10 Essential (primary) hypertension: Secondary | ICD-10-CM | POA: Insufficient documentation

## 2020-07-26 DIAGNOSIS — K219 Gastro-esophageal reflux disease without esophagitis: Secondary | ICD-10-CM

## 2020-07-26 DIAGNOSIS — E785 Hyperlipidemia, unspecified: Secondary | ICD-10-CM

## 2020-07-26 DIAGNOSIS — R69 Illness, unspecified: Secondary | ICD-10-CM | POA: Diagnosis not present

## 2020-07-26 DIAGNOSIS — R5382 Chronic fatigue, unspecified: Secondary | ICD-10-CM

## 2020-07-26 DIAGNOSIS — F419 Anxiety disorder, unspecified: Secondary | ICD-10-CM

## 2020-07-26 DIAGNOSIS — F32A Depression, unspecified: Secondary | ICD-10-CM

## 2020-07-26 DIAGNOSIS — F5102 Adjustment insomnia: Secondary | ICD-10-CM

## 2020-07-26 NOTE — Assessment & Plan Note (Signed)
BP low Will d/w Dr. Parks Ranger, likely need to d/c Clonidine

## 2020-07-26 NOTE — Assessment & Plan Note (Signed)
Continue Trelegy Referral to pulmonology for further evaluation and treatment Encouraged smoking cessation, handout given

## 2020-07-26 NOTE — Assessment & Plan Note (Signed)
Persistent Continue Duloxetine, Wellbutrin, Clonazepam and Hydroxyzine She does not want to schedule an appt with a therapist She will continue to follow with psychiatry, will follow

## 2020-07-26 NOTE — Progress Notes (Signed)
Subjective:    Patient ID: Brandi Calderon, female    DOB: 09-21-1965, 55 y.o.   MRN: [id number]  HPI  Patient presents the clinic today for follow-up of chronic conditions.  She is establishing care with me today, transferring care from Cyndia Skeeters, NP.  DM2 with Peripheral Neuropathy: Her last A1c was 8.1%, 05/2020.  She is taking Metformin, Jardiance and Trulicity as prescribed.  She takes Gabapentin as prescribed for neuropathic pain.  She checks her sugars intermittently, but denies hypoglycemia.  She checks her feet routinely, sees podiatry.  Her last eye exam was within the last year, in [place]. She follows with endocrinology. Flu 12/2018.  Pneumovax never.  COVID-Pfizer x1.  HLD: Her last LDL was 146, 12/2018. She denies myalgias on Atorvastatin and Lovaza.  She tries to consume a low-fat diet.  Anxiety and Depression: Persistent.  Managed on Duloxetine, Wellbutrin, Clonazepam and Hydroxyzine.  She is not currently seeing a therapist but is supposed to be getting an appt with therapy. She follows with psychiatry She denies SI/HI.  Chronic Pain: Mainly in her spine and bilateral SI joints.  Managed with Duloxetine and Gabapentin.  GERD: Triggered by "everything I eat".  She denies breakthrough on Pantoprazole.  There is no upper GI on file.  COPD.  Chest x-ray from 05/2020 reviewed.  She is taking Trelegy as prescribed.  There are no PFTs on file.  She has a CT lung cancer screening ordered.  She is also due to follow-up on elevated white blood cell count.  Her last WBC was 15.3%, 05/2020. She does smoke. She denies s/s of infection.  She also reports chronic fatigue. This stated about 6 months ago after getting Covid. She reports she does not sleep well. She gets up about 2:30 almost every morning, up for a few hours. She averages 6 hours of sleep per night. She sometimes feels rested when she wakes up. She naps daily for about 1-2 hours. She does snore at night. She has been trying to  walk on the treadmill for 5 minutes, 2 days per week.  Review of Systems      Past Medical History:  Diagnosis Date  . Anxiety   . Arthritis   . Asthma   . Back pain   . COPD (chronic obstructive pulmonary disease) ([place])   . Depression   . Diabetes mellitus   . Headache   . Hyperlipidemia   . Hypertension   . Tendonitis     Current Outpatient Medications  Medication Sig Dispense Refill  . albuterol (VENTOLIN HFA) 108 (90 Base) MCG/ACT inhaler Inhale 1-2 puffs into the lungs every 6 (six) hours as needed for wheezing or shortness of breath. 6.7 each 1  . Aspirin-Salicylamide-Caffeine (BC HEADACHE POWDER PO) Take 4 packets by mouth daily as needed (pain).    [name] Kitchen atorvastatin (LIPITOR) 20 MG tablet TAKE 1 TABLET (20 MG TOTAL) BY MOUTH DAILY AT 6 PM. 30 tablet 1  . buPROPion (WELLBUTRIN XL) 150 MG 24 hr tablet     . clonazePAM (KLONOPIN) 0.5 MG tablet Take 0.5 mg by mouth 2 (two) times daily as needed for anxiety (Sometimes takes 3 tablets a day).     . cloNIDine (CATAPRES) 0.1 MG tablet Take 0.1 mg by mouth at bedtime.    . DULoxetine (CYMBALTA) 30 MG capsule 1 capsule daily.     . fluticasone (FLONASE) 50 MCG/ACT nasal spray Place 2 sprays into both nostrils daily. 15.8 mL 1  . Fluticasone-Umeclidin-Vilant (TRELEGY ELLIPTA)  100-62.5-25 MCG/INH AEPB Inhale 1 puff into the lungs daily. 30 each 5  . furosemide (LASIX) 40 MG tablet TAKE 1 TABLET BY MOUTH EVERY DAY 30 tablet 1  . gabapentin (NEURONTIN) 300 MG capsule Take by mouth at bedtime.    . gabapentin (NEURONTIN) 600 MG tablet TAKE 1 TABLET BY MOUTH THREE TIMES A DAY 270 tablet 0  . hydrOXYzine (VISTARIL) 25 MG capsule Take 25-50 mg by mouth at bedtime as needed.    [name] Kitchen JARDIANCE 25 MG TABS tablet Take 25 mg by mouth daily.    . medroxyPROGESTERone (PROVERA) 10 MG tablet Take 1 tablet (10 mg total) by mouth daily. 30 tablet 11  . metFORMIN (GLUCOPHAGE) 1000 MG tablet TAKE ONE TABLET BY MOUTH 2 TIMES A DAY WITH A MEAL 180 tablet 1   . omega-3 acid ethyl esters (LOVAZA) 1 g capsule TAKE 1 CAPSULE (1 G TOTAL) BY MOUTH 2 (TWO) TIMES DAILY. 180 capsule 3  . OneTouch Delica Lancets 78G MISC     . ONETOUCH VERIO test strip     . pantoprazole (PROTONIX) 40 MG tablet TAKE 1 TABLET BY MOUTH EVERY DAY 90 tablet 1  . TRULICITY 4.5 NF/6.2ZH SOPN Inject 0.5 mLs (4.5 mg total) subcutaneously every 7 (seven) days     No current facility-administered medications for this visit.    Allergies  Allergen Reactions  . Penicillins Anaphylaxis    Has patient had a PCN reaction causing immediate rash, facial/tongue/throat swelling, SOB or lightheadedness with hypotension: Yes Has patient had a PCN reaction causing severe rash involving mucus membranes or skin necrosis: No Has patient had a PCN reaction that required hospitalization: No Has patient had a PCN reaction occurring within the last 10 years: No If all of the above answers are "NO", then may proceed with Cephalosporin use.   Throat swells  . Victoza [Liraglutide]     Severe nausea  . Vicodin [Hydrocodone-Acetaminophen] Rash    And hives    Family History  Problem Relation Age of Onset  . Kidney disease Father   . Diabetes Father   . Dementia Father   . Diabetes Sister   . Diabetes Brother   . Diabetes Mother   . Diabetes Brother     Social History   Socioeconomic History  . Marital status: Divorced    Spouse name: Not on file  . Number of children: Not on file  . Years of education: Not on file  . Highest education level: Not on file  Occupational History  . Not on file  Tobacco Use  . Smoking status: Current Every Day Smoker    Packs/day: 3.00    Years: 33.00    Pack years: 99.00    Types: Cigarettes  . Smokeless tobacco: Never Used  Vaping Use  . Vaping Use: Never used  Substance and Sexual Activity  . Alcohol use: No  . Drug use: Yes    Types: Marijuana    Comment: States she smokes MJ when she does not have any Tramadol.  . Sexual activity:  Not on file  Other Topics Concern  . Not on file  Social History Narrative   ** Merged History Encounter **       In the process of getting SSD for depression, DM and neuropathy   Social Determinants of Health   Financial Resource Strain: Low Risk   . Difficulty of Paying Living Expenses: Not hard at all  Food Insecurity: No Food Insecurity  . Worried About Estate manager/land agent  of Food in the Last Year: Never true  . Ran Out of Food in the Last Year: Never true  Transportation Needs: No Transportation Needs  . Lack of Transportation (Medical): No  . Lack of Transportation (Non-Medical): No  Physical Activity: Inactive  . Days of Exercise per Week: 0 days  . Minutes of Exercise per Session: 0 min  Stress: No Stress Concern Present  . Feeling of Stress : Only a little  Social Connections: Socially Isolated  . Frequency of Communication with Friends and Family: More than three times a week  . Frequency of Social Gatherings with Friends and Family: More than three times a week  . Attends Religious Services: Never  . Active Member of Clubs or Organizations: No  . Attends Archivist Meetings: Never  . Marital Status: Divorced  Human resources officer Violence: Not At Risk  . Fear of Current or Ex-Partner: No  . Emotionally Abused: No  . Physically Abused: No  . Sexually Abused: No     Constitutional: Patient reports fatigue.  Denies fever, malaise, headache or abrupt weight changes.  HEENT: Denies eye pain, eye redness, ear pain, ringing in the ears, wax buildup, runny nose, nasal congestion, bloody nose, or sore throat. Respiratory: Pt reports snoring. Denies difficulty breathing, shortness of breath, cough or sputum production.   Cardiovascular: Denies chest pain, chest tightness, palpitations or swelling in the hands or feet.  Gastrointestinal: Denies abdominal pain, bloating, constipation, diarrhea or blood in the stool.  GU: Denies urgency, frequency, pain with urination, burning  sensation, blood in urine, odor or discharge. Musculoskeletal: Patient reports chronic joint pain.  Denies decrease in range of motion, difficulty with gait, muscle pain or joint swelling.  Skin: Denies redness, rashes, lesions or ulcercations.  Neurological: Patient reports neuropathic pain, difficulty with memory, insomnia, excessive daytime sleepiness.  Denies dizziness, difficulty with speech or problems with balance and coordination.  Psych: Patient has a history of anxiety and depression.  Denies SI/HI.  No other specific complaints in a complete review of systems (except as listed in HPI above).  Objective:   Physical Exam   BP (!) 107/58 (BP Location: Left Arm, Patient Position: Sitting, Cuff Size: Normal)   Pulse 68   Temp (!) 97.3 F (36.3 C) (Temporal)   Resp 18   Ht 5\' 1"  (1.549 m)   Wt 133 lb (60.3 kg)   SpO2 100%   BMI 25.13 kg/m    General: Appears her stated age, well developed, well nourished in NAD. Skin: Warm, dry and intact. No rashes noted. HEENT: Head: normal shape and size; Eyes: EOMs intact;  Cardiovascular: Normal rate and rhythm. S1,S2 noted.  No murmur, rubs or gallops noted.  Pulmonary/Chest: Normal effort and positive vesicular breath sounds with intermittent expiratory wheeze noted bilaterally. No respiratory distress. No rales or ronchi noted.  Abdomen:  Normal bowel sounds.  Musculoskeletal: No difficulty with gait.  Neurological: Alert and oriented.  Psychiatric: Mood and affect mildly flat. Behavior is normal. Judgment and thought content normal.     BMET    Component Value Date/Time   NA 141 09/07/2019 1019   NA 139 06/25/2017 1027   NA 134 (L) 12/07/2012 1809   K 4.7 09/07/2019 1019   K 3.4 (L) 12/07/2012 1809   CL 104 09/07/2019 1019   CL 103 12/07/2012 1809   CO2 27 09/07/2019 1019   CO2 25 12/07/2012 1809   GLUCOSE 129 (H) 09/07/2019 1019   GLUCOSE 274 (H) 12/07/2012 1809  BUN 16 09/07/2019 1019   BUN 10 06/25/2017 1027   BUN  10 12/07/2012 1809   CREATININE 0.81 09/07/2019 1019   CALCIUM 9.8 09/07/2019 1019   CALCIUM 8.6 12/07/2012 1809   GFRNONAA 84 09/07/2019 1019   GFRAA 97 09/07/2019 1019    Lipid Panel     Component Value Date/Time   CHOL 205 (H) 01/04/2019 0929   CHOL 120 12/24/2016 1827   TRIG 87 01/04/2019 0929   HDL 40 (L) 01/04/2019 0929   HDL 29 (L) 12/24/2016 1827   CHOLHDL 5.1 (H) 01/04/2019 0929   LDLCALC 146 (H) 01/04/2019 0929    CBC    Component Value Date/Time   WBC 15.3 (H) 06/13/2020 1207   RBC 4.88 06/13/2020 1207   HGB 14.6 06/13/2020 1207   HGB 12.8 06/25/2017 1027   HCT 43.9 06/13/2020 1207   HCT 38.3 06/25/2017 1027   PLT 317 06/13/2020 1207   PLT 348 06/25/2017 1027   MCV 90.0 06/13/2020 1207   MCV 90 06/25/2017 1027   MCV 80 12/07/2012 1809   MCH 29.9 06/13/2020 1207   MCHC 33.3 06/13/2020 1207   RDW 13.7 06/13/2020 1207   RDW 15.0 06/25/2017 1027   RDW 17.8 (H) 12/07/2012 1809   LYMPHSABS 3,045 06/13/2020 1207   LYMPHSABS 3.3 (H) 09/11/2016 1131   MONOABS 0.5 10/10/2017 1339   EOSABS 107 06/13/2020 1207   EOSABS 0.2 09/11/2016 1131   BASOSABS 61 06/13/2020 1207   BASOSABS 0.0 09/11/2016 1131    Hgb A1C Lab Results  Component Value Date   HGBA1C 7.7 (A) 09/07/2019   HGBA1C 7.7 09/07/2019   HGBA1C 7.7 (A) 09/07/2019           Assessment & Plan:   Snoring, Insomnia, Excessive Daytime Sleepiness, Post Covid Fatigue:  Concern for OSA Referral to pulmonology for possible sleep study Advised her to try to avoid napping during the day Encouraged routine exercise   Elevated WBC, Smoker:  Likely due to smoking Encouraged smoking cessation with use of patches/gums- handout given Repeat CBC with diff today with pathology smear Could consider referral to hematology for further workup  Will follow up after labs, return precautions discussed  [name], NP This visit occurred during the SARS-CoV-2 public health emergency.  Safety protocols  were in place, including screening questions prior to the visit, additional usage of staff PPE, and extensive cleaning of exam room while observing appropriate contact time as indicated for disinfecting solutions.

## 2020-07-26 NOTE — Assessment & Plan Note (Signed)
Severe, continue Gabapentin as prescribed

## 2020-07-26 NOTE — Assessment & Plan Note (Signed)
CMET and lipid profile reviewed Encouraged her to consume a low fat diet Continue Atorvastatin and Lovaza for now

## 2020-07-26 NOTE — Assessment & Plan Note (Signed)
Continue Duloxetine and Gabapentin Encouraged regular physical activity

## 2020-07-26 NOTE — Patient Instructions (Signed)
Managing the Challenge of Quitting Smoking Quitting smoking is a physical and mental challenge. You will face cravings, withdrawal symptoms, and temptation. Before quitting, work with your health care provider to make a plan that can help you manage quitting. Preparation can help you quit and keep you from giving in. How to manage lifestyle changes Managing stress Stress can make you want to smoke, and wanting to smoke may cause stress. It is important to find ways to manage your stress. You might try some of the following:  Practice relaxation techniques. ? Breathe slowly and deeply, in through your nose and out through your mouth. ? Listen to music. ? Soak in a bath or take a shower. ? Imagine a peaceful place or vacation.  Get some support. ? Talk with family or friends about your stress. ? Join a support group. ? Talk with a counselor or therapist.  Get some physical activity. ? Go for a walk, run, or bike ride. ? Play a favorite sport. ? Practice yoga.   Medicines Talk with your health care provider about medicines that might help you deal with cravings and make quitting easier for you. Relationships Social situations can be difficult when you are quitting smoking. To manage this, you can:  Avoid parties and other social situations where people might be smoking.  Avoid alcohol.  Leave right away if you have the urge to smoke.  Explain to your family and friends that you are quitting smoking. Ask for support and let them know you might be a bit grumpy.  Plan activities where smoking is not an option. General instructions Be aware that many people gain weight after they quit smoking. However, not everyone does. To keep from gaining weight, have a plan in place before you quit and stick to the plan after you quit. Your plan should include:  Having healthy snacks. When you have a craving, it may help to: ? Eat popcorn, carrots, celery, or other cut vegetables. ? Chew  sugar-free gum.  Changing how you eat. ? Eat small portion sizes at meals. ? Eat 4-6 small meals throughout the day instead of 1-2 large meals a day. ? Be mindful when you eat. Do not watch television or do other things that might distract you as you eat.  Exercising regularly. ? Make time to exercise each day. If you do not have time for a long workout, do short bouts of exercise for 5-10 minutes several times a day. ? Do some form of strengthening exercise, such as weight lifting. ? Do some exercise that gets your heart beating and causes you to breathe deeply, such as walking fast, running, swimming, or biking. This is very important.  Drinking plenty of water or other low-calorie or no-calorie drinks. Drink 6-8 glasses of water daily.   How to recognize withdrawal symptoms Your body and mind may experience discomfort as you try to get used to not having nicotine in your system. These effects are called withdrawal symptoms. They may include:  Feeling hungrier than normal.  Having trouble concentrating.  Feeling irritable or restless.  Having trouble sleeping.  Feeling depressed.  Craving a cigarette. To manage withdrawal symptoms:  Avoid places, people, and activities that trigger your cravings.  Remember why you want to quit.  Get plenty of sleep.  Avoid coffee and other caffeinated drinks. These may worsen some of your symptoms. These symptoms may surprise you. But be assured that they are normal to have when quitting smoking. How to manage cravings   Come up with a plan for how to deal with your cravings. The plan should include the following:  A definition of the specific situation you want to deal with.  An alternative action you will take.  A clear idea for how this action will help.  The name of someone who might help you with this. Cravings usually last for 5-10 minutes. Consider taking the following actions to help you with your plan to deal with  cravings:  Keep your mouth busy. ? Chew sugar-free gum. ? Suck on hard candies or a straw. ? Brush your teeth.  Keep your hands and body busy. ? Change to a different activity right away. ? Squeeze or play with a ball. ? Do an activity or a hobby, such as making bead jewelry, practicing needlepoint, or working with wood. ? Mix up your normal routine. ? Take a short exercise break. Go for a quick walk or run up and down stairs.  Focus on doing something kind or helpful for someone else.  Call a friend or family member to talk during a craving.  Join a support group.  Contact a quitline. Where to find support To get help or find a support group:  Call the National Cancer Institute's Smoking Quitline: 1-800-QUIT NOW (784-8669)  Visit the website of the Substance Abuse and Mental Health Services Administration: www.samhsa.gov  Text QUIT to SmokefreeTXT: 478848 Where to find more information Visit these websites to find more information on quitting smoking:  National Cancer Institute: www.smokefree.gov  American Lung Association: www.lung.org  American Cancer Society: www.cancer.org  Centers for Disease Control and Prevention: www.cdc.gov  American Heart Association: www.heart.org Contact a health care provider if:  You want to change your plan for quitting.  The medicines you are taking are not helping.  Your eating feels out of control or you cannot sleep. Get help right away if:  You feel depressed or become very anxious. Summary  Quitting smoking is a physical and mental challenge. You will face cravings, withdrawal symptoms, and temptation to smoke again. Preparation can help you as you go through these challenges.  Try different techniques to manage stress, handle social situations, and prevent weight gain.  You can deal with cravings by keeping your mouth busy (such as by chewing gum), keeping your hands and body busy, calling family or friends, or  contacting a quitline for people who want to quit smoking.  You can deal with withdrawal symptoms by avoiding places where people smoke, getting plenty of rest, and avoiding drinks with caffeine. This information is not intended to replace advice given to you by your health care provider. Make sure you discuss any questions you have with your health care provider. Document Revised: 01/05/2019 Document Reviewed: 01/05/2019 Elsevier Patient Education  2021 Elsevier Inc.  

## 2020-07-26 NOTE — Assessment & Plan Note (Signed)
A1C reviewed She will continue Metformin, Jardiance and Trulicity per endocrinology Encouraged her to consume a low carb diet Encouraged routine eye exams Encouraged routine foot exams, follows with podiatry Encouraged her to get a flu shot in the fall She declines pneumovax Encouraged her to get her covid vaccines

## 2020-07-26 NOTE — Assessment & Plan Note (Signed)
CBC and CMET today Continue Pantoprazole  Will monitor

## 2020-07-27 ENCOUNTER — Telehealth: Payer: Self-pay | Admitting: *Deleted

## 2020-07-27 LAB — CBC WITH DIFFERENTIAL/PLATELET
Absolute Monocytes: 589 cells/uL (ref 200–950)
Basophils Absolute: 64 cells/uL (ref 0–200)
Basophils Relative: 0.5 %
Eosinophils Absolute: 115 cells/uL (ref 15–500)
Eosinophils Relative: 0.9 %
HCT: 44.4 % (ref 35.0–45.0)
Hemoglobin: 14.1 g/dL (ref 11.7–15.5)
Lymphs Abs: 2675 cells/uL (ref 850–3900)
MCH: 28.5 pg (ref 27.0–33.0)
MCHC: 31.8 g/dL — ABNORMAL LOW (ref 32.0–36.0)
MCV: 89.9 fL (ref 80.0–100.0)
MPV: 9.2 fL (ref 7.5–12.5)
Monocytes Relative: 4.6 %
Neutro Abs: 9357 cells/uL — ABNORMAL HIGH (ref 1500–7800)
Neutrophils Relative %: 73.1 %
Platelets: 323 10*3/uL (ref 140–400)
RBC: 4.94 10*6/uL (ref 3.80–5.10)
RDW: 13.6 % (ref 11.0–15.0)
Total Lymphocyte: 20.9 %
WBC: 12.8 10*3/uL — ABNORMAL HIGH (ref 3.8–10.8)

## 2020-07-27 LAB — PATHOLOGIST SMEAR REVIEW

## 2020-07-27 NOTE — Telephone Encounter (Signed)
Attempted to reach patient via telephone number listed in EMR, to schedule a SDMV with one of our providers. Her insurance is denying the initial lung screening scan, and we are trying to help her get it done and covered. VM was full and would not allow me to leave a message. Will attempt to call patient later today.

## 2020-07-28 ENCOUNTER — Inpatient Hospital Stay: Payer: Medicare HMO | Attending: Hospice and Palliative Medicine | Admitting: Nurse Practitioner

## 2020-07-28 DIAGNOSIS — Z87891 Personal history of nicotine dependence: Secondary | ICD-10-CM

## 2020-07-28 NOTE — Progress Notes (Signed)
Virtual Visit via Video Enabled Telemedicine Note   I connected with Brandi Calderon on 07/28/20 at 9:30 AM EST by video enabled telemedicine visit and verified that I am speaking with the correct person using two identifiers.   I discussed the limitations, risks, security and privacy concerns of performing an evaluation and management service by telemedicine and the availability of in-person appointments. I also discussed with the patient that there may be a patient responsible charge related to this service. The patient expressed understanding and agreed to proceed.   Other persons participating in the visit and their role in the encounter: Burgess Estelle, RN- checking in patient & navigation  Patient's location: Home  Provider's location: home  Chief Complaint: Low Dose CT Screening  Patient agreed to evaluation by telemedicine to discuss shared decision making for consideration of low dose CT lung cancer screening.    In accordance with CMS guidelines, patient has met eligibility criteria including age, absence of signs or symptoms of lung cancer.  Social History   Tobacco Use  . Smoking status: Current Every Day Smoker    Packs/day: 3.00    Years: 33.00    Pack years: 99.00    Types: Cigarettes  . Smokeless tobacco: Never Used  Substance Use Topics  . Alcohol use: No     A shared decision-making session was conducted prior to the performance of CT scan. This includes one or more decision aids, includes benefits and harms of screening, follow-up diagnostic testing, over-diagnosis, false positive rate, and total radiation exposure.   Counseling on the importance of adherence to annual lung cancer LDCT screening, impact of co-morbidities, and ability or willingness to undergo diagnosis and treatment is imperative for compliance of the program.   Counseling on the importance of continued smoking cessation for former smokers; the importance of smoking cessation for current  smokers, and information about tobacco cessation interventions have been given to patient including [place] and [employer] Quit  programs.   Written order for lung cancer screening with LDCT has been given to the patient and any and all questions have been answered to the best of my abilities.    Yearly follow up will be coordinated by Burgess Estelle, Thoracic Navigator.  I discussed the assessment and treatment plan with the patient. The patient was provided an opportunity to ask questions and all were answered. The patient agreed with the plan and demonstrated an understanding of the instructions.   The patient was advised to call back or seek an in-person evaluation if the symptoms worsen or if the condition fails to improve as anticipated.   I provided 15 minutes of face-to-face video visit time dedicated to the care of this patient on the date of this encounter to include pre-visit review of smoking history, face-to-face time with the patient, and post visit ordering of testing/documentation.   Beckey Rutter, DNP, AGNP-C [place] [facility] [phone] (clinic)

## 2020-08-01 NOTE — Progress Notes (Signed)
Please call pt, I want her to stop her Clonidine. She doesn't take it every night and her BP is on the low end. Please d/c from med list. Thank you!

## 2020-08-02 ENCOUNTER — Telehealth: Payer: Self-pay

## 2020-08-02 NOTE — Telephone Encounter (Signed)
-----   Message from [name], NP sent at 08/01/2020 12:16 PM EDT -----   ----- Message ----- From: Olin Hauser, DO Sent: 07/26/2020  12:14 PM EDT To: [name], NP  Sure.  I did several searches. It looks like best answer I can find is from a [place] consultation in [facility] 09/2018 - they documented that she was having menorrhagia and bad night sweats. I am assuming based off of the 0.1 QHS dose, that it would have been for that.  So, yes you should be good to discontinue the Clonidine now if she has no vasomotor symptoms etc and if lower BP.  Good work. Thank you  Nobie Putnam, [facility] 07/26/2020, 12:14 PM    ----- Message ----- From: [name], NP Sent: 07/26/2020  10:16 AM EDT To: Olin Hauser, DO  Could you review chart and see if you see any reason she should be on Clonidine. Her BP is on the low end and she has no menopausal vasomotor symptoms. I was planning to d/c but just wanted to check with you to make sure I wasn't missing something.

## 2020-08-02 NOTE — Telephone Encounter (Signed)
The pt was notified of the recommendation. She verbalize understanding, no questions or concerns.

## 2020-08-09 ENCOUNTER — Ambulatory Visit (INDEPENDENT_AMBULATORY_CARE_PROVIDER_SITE_OTHER): Payer: Medicare HMO | Admitting: Licensed Clinical Social Worker

## 2020-08-09 ENCOUNTER — Other Ambulatory Visit: Payer: Self-pay | Admitting: Family Medicine

## 2020-08-09 DIAGNOSIS — F32A Depression, unspecified: Secondary | ICD-10-CM

## 2020-08-09 NOTE — Chronic Care Management (AMB) (Signed)
    Clinical Social Work  Chronic Care Management   Phone Outreach    08/09/2020 Name: Brandi Calderon MRN: [id number] DOB: 1965-11-05  Brandi Calderon is a 55 y.o. year old female who is a primary care patient of Garnette Gunner, [name], NP .   CCM LCSW reached out to patient today by phone to introduce self, assess needs and offer Care Management services and interventions.    Unable to keep phone appointment today and requested to reschedule.  Plan:Appointment was rescheduled with CCM LCSW for 08/23/20  Review of patient status, including review of consultants reports, relevant laboratory and other test results, and collaboration with appropriate care team members and the patient's provider was performed as part of comprehensive patient evaluation and provision of care management services.    Christa See, MSW, [place] [facility] Care Management [email or web address] Phone [phone] 11:26 AM

## 2020-08-14 ENCOUNTER — Other Ambulatory Visit: Payer: Self-pay

## 2020-08-14 ENCOUNTER — Telehealth: Payer: Self-pay | Admitting: Pharmacist

## 2020-08-14 ENCOUNTER — Ambulatory Visit: Payer: Medicaid Other | Admitting: Pharmacist

## 2020-08-14 DIAGNOSIS — F331 Major depressive disorder, recurrent, moderate: Secondary | ICD-10-CM | POA: Diagnosis not present

## 2020-08-14 DIAGNOSIS — F4000 Agoraphobia, unspecified: Secondary | ICD-10-CM | POA: Diagnosis not present

## 2020-08-14 DIAGNOSIS — E118 Type 2 diabetes mellitus with unspecified complications: Secondary | ICD-10-CM

## 2020-08-14 DIAGNOSIS — E1169 Type 2 diabetes mellitus with other specified complication: Secondary | ICD-10-CM

## 2020-08-14 DIAGNOSIS — F410 Panic disorder [episodic paroxysmal anxiety] without agoraphobia: Secondary | ICD-10-CM | POA: Diagnosis not present

## 2020-08-14 DIAGNOSIS — E785 Hyperlipidemia, unspecified: Secondary | ICD-10-CM

## 2020-08-14 DIAGNOSIS — F411 Generalized anxiety disorder: Secondary | ICD-10-CM | POA: Diagnosis not present

## 2020-08-14 DIAGNOSIS — Z794 Long term (current) use of insulin: Secondary | ICD-10-CM

## 2020-08-14 DIAGNOSIS — F4010 Social phobia, unspecified: Secondary | ICD-10-CM | POA: Diagnosis not present

## 2020-08-14 DIAGNOSIS — R69 Illness, unspecified: Secondary | ICD-10-CM | POA: Diagnosis not present

## 2020-08-14 MED ORDER — ATORVASTATIN CALCIUM 20 MG PO TABS: 20.0000 mg | ORAL_TABLET | Freq: Every day | ORAL | 0 refills | Status: DC

## 2020-08-14 MED ORDER — ATORVASTATIN CALCIUM 20 MG PO TABS: 20.0000 mg | ORAL_TABLET | Freq: Every day | ORAL | 1 refills | Status: DC

## 2020-08-14 NOTE — Telephone Encounter (Signed)
Is it possible to send for a 90, rather than 30, day supply to aid patient with adherence?  Thank you! Brandi Calderon

## 2020-08-14 NOTE — Chronic Care Management (AMB) (Signed)
Chronic Care Management Pharmacy Note  08/14/2020 Name:  Luvena Wentling MRN:  [id number] DOB:  05/01/1965  Subjective: Kalis Friese is an 55 y.o. year old female who is a primary patient of [name], NP.  The CCM team was consulted for assistance with disease management and care coordination needs.    Engaged with patient by telephone for follow up visit in response to provider referral for pharmacy case management and/or care coordination services.   Consent to Services:  The patient was given information about Chronic Care Management services, agreed to services, and gave verbal consent prior to initiation of services.  Please see initial visit note for detailed documentation.   Patient Care Team: [name], NP as PCP - General (Internal Medicine) Dr. Leonides Schanz (Psychiatry) Dr. Kenton Kingfisher (Gynecology) Tawni Millers, MD (Internal Medicine) Vanita Ingles, RN as Registered Nurse ([place]) [place], Virl [place], [employer] as Pharmacist (Pharmacist) Rebekah [place], LCSW as Social Worker (Licensed Clinical Social Worker)  Recent office visits: Office Visit with PCP, NP [name], on 4/27  Hospital visits: None in previous 6 months  Objective:  Lab Results  Component Value Date   CREATININE 0.81 09/07/2019   CREATININE 0.74 01/04/2019   CREATININE 0.77 10/10/2017    Lab Results  Component Value Date   HGBA1C 7.7 (A) 09/07/2019   HGBA1C 7.7 09/07/2019   HGBA1C 7.7 (A) 09/07/2019  A1C 8.1% on 05/22/2020 per sharred record from [facility]  Last diabetic Eye exam:  Lab Results  Component Value Date/Time   HMDIABEYEEXA No Retinopathy 02/21/2020 12:00 AM    Last diabetic Foot exam: No results found for: HMDIABFOOTEX      Component Value Date/Time   CHOL 205 (H) 01/04/2019 0929   CHOL 120 12/24/2016 1827   TRIG 87 01/04/2019 0929   HDL 40 (L) 01/04/2019 0929   HDL 29 (L) 12/24/2016 1827   CHOLHDL 5.1 (H) 01/04/2019 0929    LDLCALC 146 (H) 01/04/2019 0929    Hepatic Function Latest Ref Rng & Units 09/07/2019 01/04/2019 06/25/2017  Total Protein 6.1 - 8.1 g/dL 7.2 7.1 6.7  Albumin 3.5 - 5.5 g/dL - - 4.3  AST 10 - 35 U/L 10 11 8   ALT 6 - 29 U/L 9 8 7   Alk Phosphatase 39 - 117 IU/L - - 105  Total Bilirubin 0.2 - 1.2 mg/dL 0.4 0.3 0.3   Clinical ASCVD: No  The 10-year ASCVD risk score [name] DC Jr., et al., 2013) is: 8.8%   Values used to calculate the score:     Age: 87 years     Sex: Female     Is Non-Hispanic African American: No     Diabetic: Yes     Tobacco smoker: Yes     Systolic Blood Pressure: 580 mmHg     Is BP treated: Yes     HDL Cholesterol: 40 mg/dL     Total Cholesterol: 170 mg/dL     Social History   Tobacco Use  Smoking Status Current Every Day Smoker  . Packs/day: 3.00  . Years: 33.00  . Pack years: 99.00  . Types: Cigarettes  Smokeless Tobacco Never Used    Assessment: Review of patient past medical history, allergies, medications, health status, including review of consultants reports, laboratory and other test data, was performed as part of comprehensive evaluation and provision of chronic care management services.   SDOH:  (Social Determinants of Health) assessments and interventions performed: none  CCM Care Plan  Allergies  Allergen Reactions  . Penicillins Anaphylaxis    Has patient had a PCN reaction causing immediate rash, facial/tongue/throat swelling, SOB or lightheadedness with hypotension: Yes Has patient had a PCN reaction causing severe rash involving mucus membranes or skin necrosis: No Has patient had a PCN reaction that required hospitalization: No Has patient had a PCN reaction occurring within the last 10 years: No If all of the above answers are "NO", then may proceed with Cephalosporin use.   Throat swells  . Victoza [Liraglutide]     Severe nausea  . Vicodin [Hydrocodone-Acetaminophen] Rash    And hives    Medications Reviewed Today     Reviewed by [name], NP (Nurse Practitioner) on 07/26/20 at [phone]  Med List Status: <None>  Medication Order Taking? Sig Documenting Provider Last Dose Status Informant  albuterol (VENTOLIN HFA) 108 (90 Base) MCG/ACT inhaler [id number] Yes Inhale 1-2 puffs into the lungs every 6 (six) hours as needed for wheezing or shortness of breath. Olin Hauser, DO Taking Active   Aspirin-Salicylamide-Caffeine [facility] HEADACHE POWDER PO) [id number] Yes Take 4 packets by mouth daily as needed (pain). [provider] Taking Active Self  atorvastatin (LIPITOR) 20 MG tablet [id number] Yes TAKE 1 TABLET (20 MG TOTAL) BY MOUTH DAILY AT 6 PM. Malfi, [name], FNP Taking Active   buPROPion (WELLBUTRIN XL) 150 MG 24 hr tablet [id number] Yes  [provider] Taking Active   clonazePAM (KLONOPIN) 0.5 MG tablet [id number] Yes Take 0.5 mg by mouth 2 (two) times daily as needed for anxiety (Sometimes takes 3 tablets a day).  [provider] Taking Active Self           Med Note [name], [place]   Thu Sep 05, 2016  9:54 AM)    cloNIDine (CATAPRES) 0.1 MG tablet [id number] Yes Take 0.1 mg by mouth at bedtime. [provider] Taking Active   DULoxetine (CYMBALTA) 30 MG capsule [id number] Yes 1 capsule daily.  [provider] Taking Active   fluticasone (FLONASE) 50 MCG/ACT nasal spray [id number] Yes Place 2 sprays into both nostrils daily. Olin Hauser, DO Taking Active   Fluticasone-Umeclidin-Vilant (TRELEGY ELLIPTA) 100-62.5-25 MCG/INH AEPB [id number] Yes Inhale 1 puff into the lungs daily. [name], FNP Taking Active   furosemide (LASIX) 40 MG tablet [id number] Yes TAKE 1 TABLET BY MOUTH EVERY DAY Karamalegos, Devonne Doughty, DO Taking Active   gabapentin (NEURONTIN) 300 MG capsule [id number] Yes Take by mouth at bedtime. [provider] Taking Active   gabapentin (NEURONTIN) 600 MG tablet [id number] Yes TAKE 1 TABLET BY MOUTH THREE TIMES A DAY Malfi,  [name], FNP Taking Active   hydrOXYzine (VISTARIL) 25 MG capsule [id number] Yes Take 25-50 mg by mouth at bedtime as needed. [provider] Taking Active   JARDIANCE 25 MG TABS tablet [id number] Yes Take 25 mg by mouth daily. [provider] Taking Active   medroxyPROGESTERone (PROVERA) 10 MG tablet [id number] Yes Take 1 tablet (10 mg total) by mouth daily. [name], NP Taking Active   metFORMIN (GLUCOPHAGE) 1000 MG tablet [id number] Yes TAKE ONE TABLET BY MOUTH 2 TIMES A DAY WITH A MEAL Malfi, [name], FNP Taking Active   omega-3 acid ethyl esters (LOVAZA) 1 g capsule [id number] Yes TAKE 1 CAPSULE (1 G TOTAL) BY MOUTH 2 (TWO) TIMES DAILY. Malfi, [name], FNP Taking Active   OneTouch Delica Lancets 29N MISC [id number] Yes  [provider] Taking Active  ONETOUCH VERIO test strip [id number] Yes  [provider] Taking Active   pantoprazole (PROTONIX) 40 MG tablet [id number] Yes TAKE 1 TABLET BY MOUTH EVERY DAY Malfi, [name], FNP Taking Active   TRULICITY 4.5 KM/6.2MM SOPN [id number] Yes Inject 0.5 mLs (4.5 mg total) subcutaneously every 7 (seven) days [provider] Taking Active   Med List Note [name], RN 06/25/18 7903): UDS 06-25-2018          Patient Active Problem List   Diagnosis Date Noted  . Mixed simple and mucopurulent chronic bronchitis ([place]) 07/26/2020  . Gastroesophageal reflux disease without esophagitis 07/26/2020  . Hyperlipidemia associated with type 2 diabetes mellitus ([place]) 05/24/2020  . Anxiety and depression 07/23/2018  . Chronic pain syndrome 07/23/2018  . Neuropathy due to type 2 diabetes mellitus ([place]) - severe 12/19/2015  . Type 2 diabetes mellitus with other specified complication ([place]) 83/33/8329    Immunization History  Administered Date(s) Administered  . Influenza Inj Mdck Quad Pf 02/07/2016  . Influenza,inj,Quad PF,6+ Mos 01/04/2019  . Influenza-Unspecified 02/18/2017  . PFIZER Comirnaty([place]  Top)Covid-19 Tri-Sucrose Vaccine 11/10/2019    Conditions to be addressed/monitored: T2DM, HLD, tobacco use  Care Plan : General Pharmacy (Adult)  Updates made by Vella Raring, RPH-CPP since 08/14/2020 12:00 AM    Problem: Disease Progression     Long-Range Goal: Disease Progression Prevented or Minimized   Start Date: 03/06/2020  Expected End Date: 06/04/2020  Recent Progress: On track  Priority: High  Note:   Current Barriers:  . Unable to self administer medications as prescribed . Lack of blood sugar readings for clinical team  Pharmacist Clinical Goal(s):  [name] Kitchen Over the next 90 days, patient will achieve adherence to monitoring guidelines and medication adherence to achieve therapeutic efficacy. through collaboration with PharmD and provider.   Interventions: . Inter-disciplinary care team collaboration (see longitudinal plan of care) . 1:1 collaboration with Olin Hauser, DO regarding development and update of comprehensive plan of care as evidenced by provider attestation and co-signature . Perform chart review. Patient seen for Office Visit with PCP, NP [name], on 4/27.  o Provider placed referral to Pulmonology for possible sleep study o Following visit, patient advised to discontinue clonidine due to low BP during visit . Receive message from [place] team regarding statin adherence for patient . Today counsel patient on importance of cholesterol control and adherence to atorvastatin o Patient reviews medications and confirms that she is out of atorvastatin . Will collaborate with clinical team to request renewal for 90 day supply of atorvastatin 20 mg be sent to patient's pharmacy . Reports she is unable to keep appointment for medication review with CM Pharmacist today.  o Reschedule appointment with CM Pharmacist as requested to complete medication review and follow up regarding medication adherence.  Patient Goals/Self-Care Activities . Over  the next 90 days, patient will:  o Focus on medication adherence by using weekly pillbox as adherence tool o Check blood glucose in morning and at bedtime and bring meter to future appointments with Endocrinologist o Attend medical appointments as scheduled  Follow Up Plan: Telephone follow up appointment with care management team member scheduled for: 5/20 at 1 pm     Patient's preferred pharmacy is:  [id number] [place] [address] [id number]: [phone]: [phone] Follow Up:  Patient agrees to Care Plan and Follow-up.  EHarlow Asa PharmD, [place]  [place] [phone]

## 2020-08-14 NOTE — Telephone Encounter (Signed)
[facility] ? ?KP ?

## 2020-08-14 NOTE — Telephone Encounter (Signed)
A 90 day supply was sent in.  KP

## 2020-08-14 NOTE — Patient Instructions (Signed)
Visit Information  PATIENT GOALS: Goals Addressed            This Visit's Progress   . Pharmacy Goals       Our goal A1c is less than 7%. This corresponds with fasting sugars less than 130 and 2 hour after meal sugars less than 180. Please check your blood sugar each morning and again before bed 2-3 nights per week.  Please bring blood sugar log and/or meter to every visit with Endocrinologist  Our goal bad cholesterol, or LDL, is less than 70 . This is why it is important to continue taking your atorvastatin and omega-3 prescriptions.  Feel free to call me with any questions or concerns. I look forward to our next call!  Harlow Asa, PharmD, [place] [phone]       The patient verbalized understanding of instructions, educational materials, and care plan provided today and declined offer to receive copy of patient instructions, educational materials, and care plan.   Telephone follow up appointment with care management team member scheduled for: 5/20 at 1 pm

## 2020-08-14 NOTE — Telephone Encounter (Signed)
Patient requesting renewal of atorvastatin 20 mg Rx for 90 day supply.   Would [facility]?  Thank you!  Harlow Asa, PharmD, [place], CPP Clinical Pharmacist [facility] [phone]

## 2020-08-14 NOTE — Telephone Encounter (Signed)
Called pt let her know that her [facility]. Pt verbalized understanding.  KP

## 2020-08-14 NOTE — Addendum Note (Signed)
Addended by: [name] on: 08/14/2020 03:12 PM   Modules accepted: Orders

## 2020-08-18 ENCOUNTER — Telehealth: Payer: Self-pay | Admitting: Pharmacist

## 2020-08-18 ENCOUNTER — Encounter: Payer: Self-pay | Admitting: Pharmacist

## 2020-08-18 ENCOUNTER — Ambulatory Visit: Payer: Medicaid Other | Admitting: Pharmacist

## 2020-08-18 DIAGNOSIS — E785 Hyperlipidemia, unspecified: Secondary | ICD-10-CM

## 2020-08-18 DIAGNOSIS — E118 Type 2 diabetes mellitus with unspecified complications: Secondary | ICD-10-CM

## 2020-08-18 DIAGNOSIS — Z794 Long term (current) use of insulin: Secondary | ICD-10-CM

## 2020-08-18 DIAGNOSIS — E1169 Type 2 diabetes mellitus with other specified complication: Secondary | ICD-10-CM

## 2020-08-18 DIAGNOSIS — F419 Anxiety disorder, unspecified: Secondary | ICD-10-CM | POA: Diagnosis not present

## 2020-08-18 DIAGNOSIS — R69 Illness, unspecified: Secondary | ICD-10-CM | POA: Diagnosis not present

## 2020-08-18 DIAGNOSIS — F32A Depression, unspecified: Secondary | ICD-10-CM

## 2020-08-18 DIAGNOSIS — F17200 Nicotine dependence, unspecified, uncomplicated: Secondary | ICD-10-CM

## 2020-08-18 MED ORDER — FLUTICASONE PROPIONATE 50 MCG/ACT NA SUSP: 2.0000 | Freq: Every day | NASAL | 1 refills | Status: DC

## 2020-08-18 NOTE — Chronic Care Management (AMB) (Signed)
Chronic Care Management Pharmacy Note  08/18/2020 Name:  Brandi Calderon MRN:  [id number] DOB:  05/04/1965  Subjective: Brandi Calderon is an 55 y.o. year old female who is a primary patient of [name], NP.  The CCM team was consulted for assistance with disease management and care coordination needs.    Engaged with patient by telephone for follow up visit in response to provider referral for pharmacy case management and/or care coordination services.   Consent to Services:  The patient was given information about Chronic Care Management services, agreed to services, and gave verbal consent prior to initiation of services.  Please see initial visit note for detailed documentation.   Patient Care Team: [name], NP as PCP - General (Internal Medicine) Dr. Leonides Schanz (Psychiatry) Dr. Kenton Kingfisher (Gynecology) Tawni Millers, MD (Internal Medicine) Vanita Ingles, RN as Registered Nurse ([employer]) [place], Virl Diamond, [place] as Pharmacist (Pharmacist) Rebekah Chesterfield, LCSW as Social Worker (Licensed Clinical Social Worker)  Recent office visits: Office Visit with PCP, NP [name], on 4/27  Hospital visits: None in previous 6 months  Objective:  Lab Results  Component Value Date   CREATININE 0.81 09/07/2019   CREATININE 0.74 01/04/2019   CREATININE 0.77 10/10/2017    Lab Results  Component Value Date   HGBA1C 7.7 (A) 09/07/2019   HGBA1C 7.7 09/07/2019   HGBA1C 7.7 (A) 09/07/2019  A1C 8.1% on 05/22/2020 per [facility] record from [facility]  Last diabetic Eye exam:  Lab Results  Component Value Date/Time   HMDIABEYEEXA No Retinopathy 02/21/2020 12:00 AM    Last diabetic Foot exam: No results found for: HMDIABFOOTEX      Component Value Date/Time   CHOL 205 (H) 01/04/2019 0929   CHOL 120 12/24/2016 1827   TRIG 87 01/04/2019 0929   HDL 40 (L) 01/04/2019 0929   HDL 29 (L) 12/24/2016 1827   CHOLHDL 5.1 (H) 01/04/2019 0929    LDLCALC 146 (H) 01/04/2019 0929    Hepatic Function Latest Ref Rng & Units 09/07/2019 01/04/2019 06/25/2017  Total Protein 6.1 - 8.1 g/dL 7.2 7.1 6.7  Albumin 3.5 - 5.5 g/dL - - 4.3  AST 10 - 35 U/L 10 11 8   ALT 6 - 29 U/L 9 8 7   Alk Phosphatase 39 - 117 IU/L - - 105  Total Bilirubin 0.2 - 1.2 mg/dL 0.4 0.3 0.3    Clinical ASCVD: No  The 10-year ASCVD risk score [name] DC Jr., et al., 2013) is: 8.8%   Values used to calculate the score:     Age: 14 years     Sex: Female     Is Non-Hispanic African American: No     Diabetic: Yes     Tobacco smoker: Yes     Systolic Blood Pressure: 594 mmHg     Is BP treated: Yes     HDL Cholesterol: 40 mg/dL     Total Cholesterol: 170 mg/dL     Social History   Tobacco Use  Smoking Status Current Every Day Smoker  . Packs/day: 1.00  . Years: 33.00  . Pack years: 33.00  . Types: Cigarettes  Smokeless Tobacco Never Used   BP Readings from Last 3 Encounters:  07/26/20 (!) 107/58  06/13/20 117/61  11/26/19 (!) 150/85   Pulse Readings from Last 3 Encounters:  07/26/20 68  06/13/20 71  11/26/19 67   Wt Readings from Last 3 Encounters:  07/26/20 133 lb (60.3 kg)  06/13/20 136 lb 12.8 oz (62.1 kg)  09/21/19 147 lb (66.7 kg)    Assessment: Review of patient past medical history, allergies, medications, health status, including review of consultants reports, laboratory and other test data, was performed as part of comprehensive evaluation and provision of chronic care management services.   SDOH:  (Social Determinants of Health) assessments and interventions performed: none   CCM Care Plan  Allergies  Allergen Reactions  . Penicillins Anaphylaxis    Has patient had a PCN reaction causing immediate rash, facial/tongue/throat swelling, SOB or lightheadedness with hypotension: Yes Has patient had a PCN reaction causing severe rash involving mucus membranes or skin necrosis: No Has patient had a PCN reaction that required hospitalization:  No Has patient had a PCN reaction occurring within the last 10 years: No If all of the above answers are "NO", then may proceed with Cephalosporin use.   Throat swells  . Victoza [Liraglutide]     Severe nausea  . Vicodin [Hydrocodone-Acetaminophen] Rash    And hives    Medications Reviewed Today    Reviewed by Vella Raring, RPH-CPP (Pharmacist) on 08/18/20 at 1522  Med List Status: <None>  Medication Order Taking? Sig Documenting Provider Last Dose Status Informant  albuterol (VENTOLIN HFA) 108 (90 Base) MCG/ACT inhaler [id number] Yes Inhale 1-2 puffs into the lungs every 6 (six) hours as needed for wheezing or shortness of breath. Olin Hauser, DO Taking Active   Aspirin-Salicylamide-Caffeine [facility] HEADACHE POWDER PO) [id number] No Take 4 packets by mouth daily as needed (pain).  Patient not taking: Reported on 08/18/2020   [provider] Not Taking Active Self  atorvastatin (LIPITOR) 20 MG tablet [id number] Yes Take 1 tablet (20 mg total) by mouth daily at 6 PM. [name], NP Taking Active   buPROPion (WELLBUTRIN XL) 150 MG 24 hr tablet [id number] Yes Take 150 mg by mouth daily. [provider] Taking Active   clonazePAM (KLONOPIN) 0.5 MG tablet [id number] Yes Take 0.5 mg by mouth 2 (two) times daily as needed for anxiety (Sometimes takes 3 tablets a day).  [provider] Taking Active Self           Med Note [name], [place]   Thu Sep 05, 2016  9:54 AM)    DULoxetine (CYMBALTA) 60 MG capsule [id number] Yes Take 60 mg by mouth daily. [provider] Taking Active   fluticasone (FLONASE) 50 MCG/ACT nasal spray [id number] Yes Place 2 sprays into both nostrils daily. Olin Hauser, DO Taking Active   Fluticasone-Umeclidin-Vilant (TRELEGY ELLIPTA) 100-62.5-25 MCG/INH AEPB [id number] Yes Inhale 1 puff into the lungs daily. [name], FNP Taking Active   furosemide (LASIX) 40 MG tablet [id number] Yes TAKE 1 TABLET BY MOUTH  EVERY DAY Karamalegos, Devonne Doughty, DO Taking Active   gabapentin (NEURONTIN) 300 MG capsule [id number] Yes Take by mouth 3 (three) times daily as needed. [provider] Taking Active   JARDIANCE 25 MG TABS tablet [id number] Yes Take 25 mg by mouth daily. [provider] Taking Active   medroxyPROGESTERone (PROVERA) 10 MG tablet [id number]  Take 1 tablet (10 mg total) by mouth daily. [name], NP  Active   metFORMIN (GLUCOPHAGE) 1000 MG tablet [id number] Yes TAKE ONE TABLET BY MOUTH 2 TIMES A DAY WITH A MEAL Malfi, [name], FNP Taking Active   omega-3 acid ethyl esters (LOVAZA) 1 g capsule [id number] Yes TAKE 1 CAPSULE (1 G TOTAL) BY MOUTH 2 (TWO) TIMES DAILY. Malfi, [name], FNP  Taking Active   OneTouch Delica Lancets 24M MISC [id number]   [provider]  Active   Roma Schanz test strip [id number]   [provider]  Active   pantoprazole (PROTONIX) 40 MG tablet [id number] Yes TAKE 1 TABLET BY MOUTH EVERY DAY Malfi, [name], FNP Taking Active   TRULICITY 4.5 IW/5.8KD SOPN [id number] Yes Inject 0.5 mLs (4.5 mg total) subcutaneously every 7 (seven) days [provider] Taking Active   Med List Note [name], RN 06/25/18 3976): UDS 06-25-2018          Patient Active Problem List   Diagnosis Date Noted  . Mixed simple and mucopurulent chronic bronchitis ([place]) 07/26/2020  . Gastroesophageal reflux disease without esophagitis 07/26/2020  . Hyperlipidemia associated with type 2 diabetes mellitus ([place]) 05/24/2020  . Anxiety and depression 07/23/2018  . Chronic pain syndrome 07/23/2018  . Neuropathy due to type 2 diabetes mellitus ([place]) - severe 12/19/2015  . Type 2 diabetes mellitus with other specified complication ([place]) 73/41/9379    Immunization History  Administered Date(s) Administered  . Influenza Inj Mdck Quad Pf 02/07/2016  . Influenza,inj,Quad PF,6+ Mos 01/04/2019  . Influenza-Unspecified 02/18/2017  . PFIZER Comirnaty(Gray  Top)Covid-19 Tri-Sucrose Vaccine 11/10/2019    Conditions to be addressed/monitored: T2DM, HLD, tobacco use  Care Plan : General Pharmacy (Adult)  Updates made by Vella Raring, RPH-CPP since 08/18/2020 12:00 AM    Problem: Disease Progression     Long-Range Goal: Disease Progression Prevented or Minimized   Start Date: 03/06/2020  Expected End Date: 06/04/2020  This Visit's Progress: On track  Recent Progress: On track  Priority: High  Note:   Current Barriers:  . Unable to self administer medications as prescribed . Lack of blood sugar readings for clinical team  Pharmacist Clinical Goal(s):  [name] Kitchen Over the next 90 days, patient will achieve adherence to monitoring guidelines and medication adherence to achieve therapeutic efficacy. through collaboration with PharmD and provider.   Interventions: . Inter-disciplinary care team collaboration (see longitudinal plan of care) . 1:1 collaboration with [name], NP regarding development and update of comprehensive plan of care as evidenced by provider attestation and co-signature . Perform chart review. Patient seen for Office Visit with PCP, NP [name], on 4/27.  o Provider placed referral to Pulmonology for possible sleep study - Note appointment with Pulmonology has been scheduled for 6/1 o Following visit, patient advised to discontinue clonidine due to low BP during visit . Comprehensive medication review performed; medication list updated in electronic medical record . Today confirms stopped taking clonidine as directed by PCP . Will collaborate with clinical team to request renewal for 90 day supply of fluticasone nasal spray be sent to patient's pharmacy  Hyperlipidemia: . Uncontrolled; current treatment: o Atorvastatin 20 mg daily (restarted this week) o Omega-3 1 gram twice daily . Today patient confirms picked up and restarted taking atorvastatin this week . Counsel on importance of adherence to atorvastatin/LDL  lowering . Identify patient has been taking omega-3 only once daily, rather than twice daily. Confirms understanding of direction and states will fill correctly into weekly pillbox  Type 2 Diabetes: . Uncontrolled based on latest A1C result from Endocrinologist; current treatment:  ? Metformin 1000 mg twice daily ? Trulicity 4.5 mg weekly on Mondays ? Jardiance 25 mg daily (reports started on 2/23) . Patient unable to recall recent blood sugar readings and denies keeping log of results, reports instead brings meter to Endocrinology appointments for download . Reports checking  blood sugars fasting each morning and before bed 2-3 nights/week as directed by Endocrinologist.  . Reports morning blood sugar readings primarily ranging ~140-150 . Have counseled on importance of well balanced meals and limiting carbohydrate portion size . Have counseled on role of exercise for controlling blood sugar . Blood pressure: reports received an upper arm blood pressure monitor through OTC benefit and started monitoring, but has not recorded readings ? Counsel patient on blood pressure monitoring technique ? Encourage patient to check blood pressure 1-2 times/week at home, keep a log and bring record with her to medical appointments   Medication Adherence: . Reports using weekly pillbox as adherence tool . Confirms daughter setup a weekly reminder alarm for patient's Trulicity dose on Tuesdays  . Confirms using maintenance (Trelegy) inhaler daily as directed, rinsing mouth after each use and using rescue (albuterol) inhaler as needed as directed   Tobacco use:  Reports currently working on reducing smoking to 1 pack/day  Interested in quitting smoking. Not ready to set quit date today  Reports family is her motivation for quitting. States her daughters have told her that they would quit smoking with her  Have discussed smoking cessation medication therapies, including Chantix or combination nicotine  replacement therapy   Patient Goals/Self-Care Activities . Over the next 90 days, patient will:  o Focus on medication adherence by using weekly pillbox as adherence tool o Check blood glucose in morning and at bedtime and bring meter to future appointments with Endocrinologist o Attend medical appointments as scheduled - Appointments with Endocrinology on 5/26 & 6/9 - Appointment with Pulmonology on 6/1  Follow Up Plan: Telephone follow up appointment with care management team member scheduled for: 6/29 at 9:15 am     Patient's preferred pharmacy is:  [id number] [place] [id number]: [phone]: [phone] Uses pill box? Yes  Follow Up:  Patient agrees to Care Plan and Follow-up.  EHarlow Asa PharmD, BPara March CPP Clinical Pharmacist [phone]

## 2020-08-18 NOTE — Patient Instructions (Signed)
Visit Information  PATIENT GOALS: Goals Addressed            This Visit's Progress   . Pharmacy Goals       Our goal A1c is less than 7%. This corresponds with fasting sugars less than 130 and 2 hour after meal sugars less than 180. Please check your blood sugar each morning and again before bed 2-3 nights per week.  Please bring blood sugar log and/or meter to every visit with Endocrinologist  Our goal bad cholesterol, or LDL, is less than 70 . This is why it is important to continue taking your atorvastatin and omega-3 prescriptions.  Feel free to call me with any questions or concerns. I look forward to our next call!   Harlow Asa, PharmD, [place] [phone]       The patient verbalized understanding of instructions, educational materials, and care plan provided today and declined offer to receive copy of patient instructions, educational materials, and care plan.   Telephone follow up appointment with care management team member scheduled for: 6/29 at 9:15 am

## 2020-08-18 NOTE — Telephone Encounter (Signed)
Patient requesting 90 day supply renewal of fluticasone nasal spray Rx? Would you please send to [facility]'s [facility]?  Thank you,  Harlow Asa, PharmD, [place], CPP Clinical Pharmacist [facility] [phone]

## 2020-08-23 ENCOUNTER — Ambulatory Visit: Payer: Medicare HMO | Admitting: Licensed Clinical Social Worker

## 2020-08-23 DIAGNOSIS — F419 Anxiety disorder, unspecified: Secondary | ICD-10-CM

## 2020-08-23 NOTE — Chronic Care Management (AMB) (Signed)
    Clinical Social Work  Chronic Care Management   Phone Outreach    08/23/2020 Name: Brandi Calderon MRN: [id number] DOB: 1965/08/31  Brandi Calderon is a 55 y.o. year old female who is a primary care patient of Garnette Gunner, [name], NP .   CCM LCSW reached out to patient today by phone to introduce self, assess needs and offer Care Management services and interventions.    Unable to keep phone appointment today due to patient watching her sick grandchild. Patient requested to reschedule.  Plan:Appointment was rescheduled with CCM LCSW for 08/30/20  Review of patient status, including review of consultants reports, relevant laboratory and other test results, and collaboration with appropriate care team members and the patient's provider was performed as part of comprehensive patient evaluation and provision of care management services.    Christa See, MSW, [place] [facility] Care Management [name] Phone [phone] 11:38 AM

## 2020-08-29 NOTE — Progress Notes (Signed)
@Patient  ID: [name], female    DOB: [birth year], 55 y.o.   MRN: [id number]  Chief Complaint  Patient presents with  . Consult    Referring provider: [name], NP  HPI: 55 year old female,.  Past medical history significant for type 2 diabetes, GERD, chronic bronchitis.  Patient referred to [facility] for sleep consult due to chronic fatigue and possible OSA.  08/30/2020 Presents today for sleep consult. Accompanied by her daughter. She reports loud snoring, restless sleep, waking up gasping at night and chronic fatigue. She goes to bed around 8-9pm at night, it takes her 15 mins to fall asleep. She wakes up on average 2-3 times at night and gets out of bed at 4am d/t nausea. She uses Trelegy 165mcg once daily has prescribed, she gets tired easily. She will use Albuterol rescue inhaler 2-3 times a day. She has a chronic cough. Continues to smoke more than 1 ppd. She is interested in cutting back/quitting smoking. She had a virtual visit with Beckey Rutter NP with oncology for lung cancer screening and ordered LDCT which has not yet been scheduled. She was also referred to hematology for elevated WBC which does not appear to have been completed.    Sleep questionnaire: Prior sleep study- None  Symptoms- Loud snoring, gasping for air, restless sleep, daytime fatigue  Typical bedtime- 8-9pm  Time to fall asleep- 15 mins Nocturnal awakenings- 2-3 times  Out of bed in morning- 4am  Weight changes- 10 lbs in last 2 years  Epworth- 17/24    Allergies  Allergen Reactions  . Penicillins Anaphylaxis    Has patient had a PCN reaction causing immediate rash, facial/tongue/throat swelling, SOB or lightheadedness with hypotension: Yes Has patient had a PCN reaction causing severe rash involving mucus membranes or skin necrosis: No Has patient had a PCN reaction that required hospitalization: No Has patient had a PCN reaction occurring within the last 10 years: No If all of the  above answers are "NO", then may proceed with Cephalosporin use.   Throat swells  . Victoza [Liraglutide]     Severe nausea  . Vicodin [Hydrocodone-Acetaminophen] Rash    And hives    Immunization History  Administered Date(s) Administered  . Influenza Inj Mdck Quad Pf 02/07/2016  . Influenza,inj,Quad PF,6+ Mos 01/04/2019  . Influenza-Unspecified 02/18/2017  . PFIZER Comirnaty(Gray Top)Covid-19 Tri-Sucrose Vaccine 11/10/2019    Past Medical History:  Diagnosis Date  . Anxiety   . Arthritis   . Asthma   . Back pain   . COPD (chronic obstructive pulmonary disease) ([place])   . Depression   . Diabetes mellitus   . Headache   . Hyperlipidemia   . Hypertension   . Tendonitis     Tobacco History: Social History   Tobacco Use  Smoking Status Current Every Day Smoker  . Packs/day: 3.00  . Years: 33.00  . Pack years: 99.00  . Types: Cigarettes  Smokeless Tobacco Never Used   Ready to quit: Not Answered Counseling given: Not Answered   Outpatient Medications Prior to Visit  Medication Sig Dispense Refill  . albuterol (VENTOLIN HFA) 108 (90 Base) MCG/ACT inhaler Inhale 1-2 puffs into the lungs every 6 (six) hours as needed for wheezing or shortness of breath. 6.7 each 1  . Aspirin-Salicylamide-Caffeine (BC HEADACHE POWDER PO) Take 4 packets by mouth daily as needed (pain).    [name] Kitchen atorvastatin (LIPITOR) 20 MG tablet Take 1 tablet (20 mg total) by mouth daily at 6 PM. 90  tablet 0  . buPROPion (WELLBUTRIN XL) 150 MG 24 hr tablet Take 150 mg by mouth daily.    . clonazePAM (KLONOPIN) 0.5 MG tablet Take 0.5 mg by mouth 2 (two) times daily as needed for anxiety (Sometimes takes 3 tablets a day).     . DULoxetine (CYMBALTA) 60 MG capsule Take 60 mg by mouth daily.    . fluticasone (FLONASE) 50 MCG/ACT nasal spray Place 2 sprays into both nostrils daily. 15.8 mL 1  . Fluticasone-Umeclidin-Vilant (TRELEGY ELLIPTA) 100-62.5-25 MCG/INH AEPB Inhale 1 puff into the lungs daily. 30 each 5   . furosemide (LASIX) 40 MG tablet TAKE 1 TABLET BY MOUTH EVERY DAY 30 tablet 1  . gabapentin (NEURONTIN) 300 MG capsule Take by mouth 3 (three) times daily as needed.    [name] Kitchen JARDIANCE 25 MG TABS tablet Take 25 mg by mouth daily.    . medroxyPROGESTERone (PROVERA) 10 MG tablet Take 1 tablet (10 mg total) by mouth daily. 30 tablet 11  . metFORMIN (GLUCOPHAGE) 1000 MG tablet TAKE ONE TABLET BY MOUTH 2 TIMES A DAY WITH A MEAL 180 tablet 1  . omega-3 acid ethyl esters (LOVAZA) 1 g capsule TAKE 1 CAPSULE (1 G TOTAL) BY MOUTH 2 (TWO) TIMES DAILY. 180 capsule 3  . OneTouch Delica Lancets 12A MISC     . ONETOUCH VERIO test strip     . pantoprazole (PROTONIX) 40 MG tablet TAKE 1 TABLET BY MOUTH EVERY DAY 90 tablet 1  . TRULICITY 4.5 ES/9.7NP SOPN Inject 0.5 mLs (4.5 mg total) subcutaneously every 7 (seven) days     No facility-administered medications prior to visit.   Review of Systems  Review of Systems  Constitutional: Positive for fatigue and unexpected weight change.  HENT: Positive for congestion.   Respiratory: Positive for cough and shortness of breath. Negative for chest tightness and wheezing.   Cardiovascular: Negative.   Psychiatric/Behavioral: Positive for sleep disturbance.   Physical Exam  BP 124/64 (BP Location: Left Arm, Patient Position: Sitting, Cuff Size: Normal)   Pulse 76   Temp (!) 97.1 F (36.2 C) (Temporal)   Ht 5\' 1"  (1.549 m)   Wt 130 lb 9.6 oz (59.2 kg)   SpO2 98%   BMI 24.68 kg/m  Physical Exam Constitutional:      Appearance: Normal appearance.  HENT:     Head: Normocephalic and atraumatic.     Mouth/Throat:     Mouth: Mucous membranes are moist.     Pharynx: Oropharynx is clear.  Cardiovascular:     Rate and Rhythm: Normal rate and regular rhythm.  Pulmonary:     Effort: Pulmonary effort is normal.     Breath sounds: Normal breath sounds. No wheezing.     Comments: Congested cough, np Skin:    General: Skin is warm and dry.  Neurological:      Mental Status: She is alert.  Psychiatric:        Mood and Affect: Mood normal.        Behavior: Behavior normal.        Thought Content: Thought content normal.        Judgment: Judgment normal.      Lab Results:  CBC    Component Value Date/Time   WBC 12.8 (H) 07/26/2020 0915   RBC 4.94 07/26/2020 0915   HGB 14.1 07/26/2020 0915   HGB 12.8 06/25/2017 1027   HCT 44.4 07/26/2020 0915   HCT 38.3 06/25/2017 1027   PLT 323 07/26/2020 0915  PLT 348 06/25/2017 1027   MCV 89.9 07/26/2020 0915   MCV 90 06/25/2017 1027   MCV 80 12/07/2012 1809   MCH 28.5 07/26/2020 0915   MCHC 31.8 (L) 07/26/2020 0915   RDW 13.6 07/26/2020 0915   RDW 15.0 06/25/2017 1027   RDW 17.8 (H) 12/07/2012 1809   LYMPHSABS 2,675 07/26/2020 0915   LYMPHSABS 3.3 (H) 09/11/2016 1131   MONOABS 0.5 10/10/2017 1339   EOSABS 115 07/26/2020 0915   EOSABS 0.2 09/11/2016 1131   BASOSABS 64 07/26/2020 0915   BASOSABS 0.0 09/11/2016 1131    BMET    Component Value Date/Time   NA 141 09/07/2019 1019   NA 139 06/25/2017 1027   NA 134 (L) 12/07/2012 1809   K 4.7 09/07/2019 1019   K 3.4 (L) 12/07/2012 1809   CL 104 09/07/2019 1019   CL 103 12/07/2012 1809   CO2 27 09/07/2019 1019   CO2 25 12/07/2012 1809   GLUCOSE 129 (H) 09/07/2019 1019   GLUCOSE 274 (H) 12/07/2012 1809   BUN 16 09/07/2019 1019   BUN 10 06/25/2017 1027   BUN 10 12/07/2012 1809   CREATININE 0.81 09/07/2019 1019   CALCIUM 9.8 09/07/2019 1019   CALCIUM 8.6 12/07/2012 1809   GFRNONAA 84 09/07/2019 1019   GFRAA 97 09/07/2019 1019    BNP No results found for: BNP  ProBNP No results found for: PROBNP  Imaging: No results found.   Assessment & Plan:   Snoring - Patient reports symptoms of loud snoring, restless sleep, waking up gasping for air day and daytime fatigue. Epworth 17/24. Recommend checking HST to evaluation for possible underlying sleep apnea. Discussed risk of untreated sleep apnea including cardiac arrhthymias,  stroke, DM, pulm HTN. We briefly discuss treatment options. If sleep study does not show significant OSA she will need to follow back up with PCP regarding her chronic fatigue.   Leukocytosis - PCP has noted constantly elevated WBC levels, felt to be from smoking. She was referred to hematology for evaluation but it does not appear that she has been set up for consult. We will re-refer.   Mixed simple and mucopurulent chronic bronchitis (HCC) - Chronic cough. Compliant with Trelegy 100. Using SABA 2-3 times a day. Needs formal PFTs. Adding flutter valve. Strongly encourage smoking cessation. Needs to return to see Dr. Mortimer Fries or Patsey Berthold for formal pulmonary consult   Smoker - Patient is heavy smoker, not ready to quit but is considering cutting back. Recommend tapering amount she smokes and use NRT. - She saw oncology for virtual lung cancer screening visit and was ordered for LDCT which has not been completed. We will get in contact with coordinator for LDCT to see if we can get this scheduled    > 40 mins spent   Martyn Ehrich, NP 08/30/2020

## 2020-08-30 ENCOUNTER — Other Ambulatory Visit: Payer: Self-pay

## 2020-08-30 ENCOUNTER — Ambulatory Visit (INDEPENDENT_AMBULATORY_CARE_PROVIDER_SITE_OTHER): Payer: Medicare HMO | Admitting: Primary Care

## 2020-08-30 ENCOUNTER — Ambulatory Visit (INDEPENDENT_AMBULATORY_CARE_PROVIDER_SITE_OTHER): Payer: Medicare HMO | Admitting: Licensed Clinical Social Worker

## 2020-08-30 ENCOUNTER — Encounter: Payer: Self-pay | Admitting: Primary Care

## 2020-08-30 VITALS — BP 124/64 | HR 76 | Temp 97.1°F | Ht 61.0 in | Wt 130.6 lb

## 2020-08-30 DIAGNOSIS — F17210 Nicotine dependence, cigarettes, uncomplicated: Secondary | ICD-10-CM | POA: Insufficient documentation

## 2020-08-30 DIAGNOSIS — F419 Anxiety disorder, unspecified: Secondary | ICD-10-CM | POA: Diagnosis not present

## 2020-08-30 DIAGNOSIS — D72828 Other elevated white blood cell count: Secondary | ICD-10-CM | POA: Insufficient documentation

## 2020-08-30 DIAGNOSIS — D72829 Elevated white blood cell count, unspecified: Secondary | ICD-10-CM

## 2020-08-30 DIAGNOSIS — R0683 Snoring: Secondary | ICD-10-CM | POA: Insufficient documentation

## 2020-08-30 DIAGNOSIS — J418 Mixed simple and mucopurulent chronic bronchitis: Secondary | ICD-10-CM | POA: Diagnosis not present

## 2020-08-30 DIAGNOSIS — J449 Chronic obstructive pulmonary disease, unspecified: Secondary | ICD-10-CM | POA: Diagnosis not present

## 2020-08-30 DIAGNOSIS — Z794 Long term (current) use of insulin: Secondary | ICD-10-CM

## 2020-08-30 DIAGNOSIS — E1169 Type 2 diabetes mellitus with other specified complication: Secondary | ICD-10-CM

## 2020-08-30 DIAGNOSIS — R69 Illness, unspecified: Secondary | ICD-10-CM | POA: Diagnosis not present

## 2020-08-30 DIAGNOSIS — F32A Depression, unspecified: Secondary | ICD-10-CM

## 2020-08-30 DIAGNOSIS — F17200 Nicotine dependence, unspecified, uncomplicated: Secondary | ICD-10-CM | POA: Insufficient documentation

## 2020-08-30 DIAGNOSIS — D729 Disorder of white blood cells, unspecified: Secondary | ICD-10-CM | POA: Insufficient documentation

## 2020-08-30 DIAGNOSIS — G894 Chronic pain syndrome: Secondary | ICD-10-CM

## 2020-08-30 NOTE — Patient Instructions (Addendum)
  Recommendations: - Continue Trelegy 1 puff daily in am (rinse mouth after use) - Use Albuterol rescue inhaler 2 puffs every 6 hours as needed for breakthrough shortness of breath - Take Mucinex 600mg  twice a day with glass of water to loosen phlegm  - Use Flutter valve three times a day for chest congestion   Orders: - PFTs RE: COPD - Flutter valve re: COPD - Home sleep test re: Snoring   Referral: - Hematology re: leukocytosis   Follow-up - First available with Dr. Mortimer Fries or Dr. Patsey Berthold for pulmonary consult

## 2020-08-30 NOTE — Chronic Care Management (AMB) (Signed)
Chronic Care Management    Clinical Social Work Note  08/30/2020 Name: Brandi Calderon MRN: [id number] DOB: [birth year]  Brandi Calderon is a 55 y.o. year old female who is a primary care patient of Brandi Fenton, NP. The CCM team was consulted to assist the patient with chronic disease management and/or care coordination needs related to: Mental Health Counseling and Resources.   Engaged with patient by telephone for follow up visit in response to provider referral for social work chronic care management and care coordination services.   Consent to Services:  The patient was given information about Chronic Care Management services, agreed to services, and gave verbal consent prior to initiation of services.  Please see initial visit note for detailed documentation.   Patient agreed to services and consent obtained.   Assessment: Patient is engaged in conversation, continues to maintain positive progress with care plan goals. Patient continues to participate in med management through [facility] and receives strong support from family. Strategies discussed to assist with stress management and promotion of self-care to combat stress of chronic medical conditions. See Care Plan below for interventions and patient self-care actives. Recent life changes Brandi Calderon: Father's declining health and management of pt's health conditions Recommendation: Patient may benefit from, and is in agreement to work with LCSW to address care coordination needs and will continue to work with the clinical team to address health care and disease management related needs. .  Follow up Plan: Patient would like continued follow-up.  CCM LCSW will follow up with patient on 11/14/20. Patient will call office if needed prior to next encounter.  SDOH (Social Determinants of Health) assessments and interventions performed:    Advanced Directives Status: Not addressed in this encounter.  CCM Care Plan  Allergies  Allergen  Reactions  . Penicillins Anaphylaxis    Has patient had a PCN reaction causing immediate rash, facial/tongue/throat swelling, SOB or lightheadedness with hypotension: Yes Has patient had a PCN reaction causing severe rash involving mucus membranes or skin necrosis: No Has patient had a PCN reaction that required hospitalization: No Has patient had a PCN reaction occurring within the last 10 years: No If all of the above answers are "NO", then may proceed with Cephalosporin use.   Throat swells  . Victoza [Liraglutide]     Severe nausea  . Vicodin [Hydrocodone-Acetaminophen] Rash    And hives    Outpatient Encounter Medications as of 08/30/2020  Medication Sig  . albuterol (VENTOLIN HFA) 108 (90 Base) MCG/ACT inhaler Inhale 1-2 puffs into the lungs every 6 (six) hours as needed for wheezing or shortness of breath.  . Aspirin-Salicylamide-Caffeine (BC HEADACHE POWDER PO) Take 4 packets by mouth daily as needed (pain).  [name] Kitchen atorvastatin (LIPITOR) 20 MG tablet Take 1 tablet (20 mg total) by mouth daily at 6 PM.  . buPROPion (WELLBUTRIN XL) 150 MG 24 hr tablet Take 150 mg by mouth daily.  . clonazePAM (KLONOPIN) 0.5 MG tablet Take 0.5 mg by mouth 2 (two) times daily as needed for anxiety (Sometimes takes 3 tablets a day).   . DULoxetine (CYMBALTA) 60 MG capsule Take 60 mg by mouth daily.  . fluticasone (FLONASE) 50 MCG/ACT nasal spray Place 2 sprays into both nostrils daily.  . Fluticasone-Umeclidin-Vilant (TRELEGY ELLIPTA) 100-62.5-25 MCG/INH AEPB Inhale 1 puff into the lungs daily.  . furosemide (LASIX) 40 MG tablet TAKE 1 TABLET BY MOUTH EVERY DAY  . gabapentin (NEURONTIN) 300 MG capsule Take by mouth 3 (three) times daily as needed.  [name] Kitchen  JARDIANCE 25 MG TABS tablet Take 25 mg by mouth daily.  . medroxyPROGESTERone (PROVERA) 10 MG tablet Take 1 tablet (10 mg total) by mouth daily.  . metFORMIN (GLUCOPHAGE) 1000 MG tablet TAKE ONE TABLET BY MOUTH 2 TIMES A DAY WITH A MEAL  . omega-3 acid ethyl  esters (LOVAZA) 1 g capsule TAKE 1 CAPSULE (1 G TOTAL) BY MOUTH 2 (TWO) TIMES DAILY.  Brandi Calderon Lancets 67E MISC   . ONETOUCH VERIO test strip   . pantoprazole (PROTONIX) 40 MG tablet TAKE 1 TABLET BY MOUTH EVERY DAY  . TRULICITY 4.5 LF/8.1OF SOPN Inject 0.5 mLs (4.5 mg total) subcutaneously every 7 (seven) days   No facility-administered encounter medications on file as of 08/30/2020.    Patient Active Problem List   Diagnosis Date Noted  . Snoring 08/30/2020  . Leukocytosis 08/30/2020  . Smoker 08/30/2020  . Mixed simple and mucopurulent chronic bronchitis ([place]) 07/26/2020  . Gastroesophageal reflux disease without esophagitis 07/26/2020  . Hyperlipidemia associated with type 2 diabetes mellitus ([place]) 05/24/2020  . Anxiety and depression 07/23/2018  . Chronic pain syndrome 07/23/2018  . Neuropathy due to type 2 diabetes mellitus ([place]) - severe 12/19/2015  . Type 2 diabetes mellitus with other specified complication ([place]) 75/12/2583    Conditions to be addressed/monitored: Anxiety and Depression; Mental Health Concerns   Care Plan : General Social Work (Adult)  Updates made by Rebekah Chesterfield, LCSW since 08/30/2020 12:00 AM    Problem: Response to Treatment (Depression)     Long-Range Goal: Response to Treatment Maximized   Start Date: 05/23/2020  This Visit's Progress: On track  Priority: Medium  Note:   Timeframe:  Long-Range Goal Priority:  Medium  Start Date:  05/23/20                         Expected End Date:  08/20/20                    Follow Up Date- 11/14/20  Current Barriers:  . Chronic Mental Health needs related to Depression, Anxiety and Grief . Limited social support . Mental Health Concerns  . Social Isolation . Loss of child in 2016 . Suicidal Ideation/Homicidal Ideation: No  Clinical Social Work Goal(s):  [name] Kitchen Over the next 120 days, patient will work with SW to address concerns related to care coordination needs and lack of  education/support/resource connection. LCSW will assist patient in gaining community resource education and additional support and resource connection as well in order to maintain health and mental health appropriately  . Over the next 120 days, patient will demonstrate improved adherence to self care as evidenced by implementing healthy self-care into her daily routine such as: attending all medical appointments, deep breathing exercises, taking time for self-reflection, taking medications as prescribed, drinking water and daily exercise to improve mobility and mood.  . Over the next 120 days, patient will work with SW bi-monthly by telephone or in person to reduce or manage symptoms related to stress, anxiety and grief. . Over the next 120 days, patient will demonstrate improved health management independence as evidenced by implementing healthy self-care skills and positive support/resources into her daily routine to help cope with stressors and improve overall health and well-being  . Over the next 120 days, patient or caregiver will verbalize basic understanding of depression/stress process and self health management plan as evidenced by her participation in development of long term plan of care and  institution of self health management strategies  Interventions: . Patient interviewed and appropriate assessments performed: brief mental health assessment . Patient reports that she is managing depression and anxiety symptoms well under current regiment, despite triggers, including chronic medical conditions and father being placed in a nursing home. Patient continues to visit with psychiatrist through [facility] and is compliant with medications. She is not interested in initiating counseling at this time. . Patient is in the process of having a lung screening scheduled, in addition, to sleep study that will be conducted at home. Patient is awaiting a call to have an imaging screen conducted  . Patient reports  that use of Gabapentin is effective in pain management . Provided patient with strategies to promote self-care and stress management to assist in management of physical and mental health conditions. Emotional support was provided . Patient reports strong support from two adult daughters who reside locally . Discussed plans with patient for ongoing care management follow up and provided patient with direct contact information for care management team . Advised patient to contact CCM LCSW for any urgent social work related issues.  Patient Self Care Activities:  . Continue compliance with medication management . Attend all scheduled provider appointments . Call provider office for new concerns or questions . Utilize strategies discussed to assist in management of symptoms     Christa See, MSW, [facility] Management [email or web address] Phone [phone] 4:09 PM

## 2020-08-30 NOTE — Assessment & Plan Note (Addendum)
-   Chronic cough. Compliant with Trelegy 100. Using SABA 2-3 times a day. Needs formal PFTs. Adding flutter valve. Strongly encourage smoking cessation. Needs to return to see Dr. Mortimer Fries or Patsey Berthold for formal pulmonary consult

## 2020-08-30 NOTE — Assessment & Plan Note (Signed)
-   Patient is heavy smoker, not ready to quit but is considering cutting back. Recommend tapering amount she smokes and use NRT. - She saw oncology for virtual lung cancer screening visit and was ordered for LDCT which has not been completed. We will get in contact with coordinator for LDCT to see if we can get this scheduled

## 2020-08-30 NOTE — Assessment & Plan Note (Addendum)
-   Patient reports symptoms of loud snoring, restless sleep, waking up gasping for air day and daytime fatigue. Epworth 17/24. Recommend checking HST to evaluation for possible underlying sleep apnea. Discussed risk of untreated sleep apnea including cardiac arrhthymias, stroke, DM, pulm HTN. We briefly discuss treatment options. If sleep study does not show significant OSA she will need to follow back up with PCP regarding her chronic fatigue.

## 2020-08-30 NOTE — Assessment & Plan Note (Addendum)
-   PCP has noted constantly elevated WBC levels, felt to be from smoking. She was referred to hematology for evaluation but it does not appear that she has been set up for consult. We will re-refer.

## 2020-08-30 NOTE — Patient Instructions (Signed)
Visit Information  Goals Addressed              This Visit's Progress     Patient Stated   .  SW- "I'm doing a lot better now overall." (pt-stated)   On track     Patient Self Care Activities:  . Continue compliance with medication management . Attend all scheduled provider appointments . Call provider office for new concerns or questions . Utilize strategies discussed to assist in management of symptoms      Other   .  SW-Manage My Emotions   On track     Patient Self Care Activities:  . Continue compliance with medication management . Attend all scheduled provider appointments . Call provider office for new concerns or questions . Utilize strategies discussed to assist in management of symptoms       Patient verbalizes understanding of instructions provided today and agrees to view in [place].   Telephone follow up appointment with care management team member scheduled for:11/14/20  Christa See, MSW, [email or web address] Phone [phone] 4:11 PM

## 2020-08-31 NOTE — Progress Notes (Signed)
Reviewed and agree with assessment/plan.   Chesley Mires, MD [facility] Pulmonary/Critical Care 08/31/2020, 8:58 AM Pager:  [phone]

## 2020-09-05 ENCOUNTER — Other Ambulatory Visit: Payer: Self-pay | Admitting: Family Medicine

## 2020-09-07 DIAGNOSIS — E1142 Type 2 diabetes mellitus with diabetic polyneuropathy: Secondary | ICD-10-CM | POA: Diagnosis not present

## 2020-09-07 DIAGNOSIS — E119 Type 2 diabetes mellitus without complications: Secondary | ICD-10-CM | POA: Diagnosis not present

## 2020-09-07 DIAGNOSIS — R69 Illness, unspecified: Secondary | ICD-10-CM | POA: Diagnosis not present

## 2020-09-07 DIAGNOSIS — E782 Mixed hyperlipidemia: Secondary | ICD-10-CM | POA: Diagnosis not present

## 2020-09-08 ENCOUNTER — Telehealth: Payer: Self-pay

## 2020-09-08 ENCOUNTER — Ambulatory Visit: Admission: RE | Admit: 2020-09-08 | Payer: Medicare HMO | Source: Ambulatory Visit

## 2020-09-08 NOTE — Telephone Encounter (Signed)
Lm for reminder of covid test prior to PFT.  09/11/2020 8:15 [facility].

## 2020-09-11 ENCOUNTER — Other Ambulatory Visit
Admission: RE | Admit: 2020-09-11 | Discharge: 2020-09-11 | Disposition: A | Discharge status: Home / Self Care | Payer: Medicare HMO | Source: Ambulatory Visit | Attending: Primary Care | Admitting: Primary Care

## 2020-09-11 ENCOUNTER — Encounter: Payer: Self-pay | Admitting: Internal Medicine

## 2020-09-11 ENCOUNTER — Inpatient Hospital Stay: Payer: Medicare HMO | Attending: Hospice and Palliative Medicine | Admitting: Internal Medicine

## 2020-09-11 ENCOUNTER — Inpatient Hospital Stay: Payer: Medicare HMO

## 2020-09-11 ENCOUNTER — Other Ambulatory Visit: Payer: Self-pay

## 2020-09-11 VITALS — BP 107/68 | HR 72 | Temp 98.2°F | Resp 20

## 2020-09-11 DIAGNOSIS — Z01812 Encounter for preprocedural laboratory examination: Secondary | ICD-10-CM | POA: Diagnosis not present

## 2020-09-11 DIAGNOSIS — Z20822 Contact with and (suspected) exposure to covid-19: Secondary | ICD-10-CM | POA: Insufficient documentation

## 2020-09-11 DIAGNOSIS — D729 Disorder of white blood cells, unspecified: Secondary | ICD-10-CM

## 2020-09-11 DIAGNOSIS — F410 Panic disorder [episodic paroxysmal anxiety] without agoraphobia: Secondary | ICD-10-CM | POA: Diagnosis not present

## 2020-09-11 DIAGNOSIS — F331 Major depressive disorder, recurrent, moderate: Secondary | ICD-10-CM | POA: Diagnosis not present

## 2020-09-11 DIAGNOSIS — F411 Generalized anxiety disorder: Secondary | ICD-10-CM | POA: Diagnosis not present

## 2020-09-11 DIAGNOSIS — D72829 Elevated white blood cell count, unspecified: Secondary | ICD-10-CM | POA: Diagnosis not present

## 2020-09-11 DIAGNOSIS — R69 Illness, unspecified: Secondary | ICD-10-CM | POA: Diagnosis not present

## 2020-09-11 DIAGNOSIS — F17210 Nicotine dependence, cigarettes, uncomplicated: Secondary | ICD-10-CM | POA: Diagnosis not present

## 2020-09-11 DIAGNOSIS — F4000 Agoraphobia, unspecified: Secondary | ICD-10-CM | POA: Diagnosis not present

## 2020-09-11 DIAGNOSIS — F4010 Social phobia, unspecified: Secondary | ICD-10-CM | POA: Diagnosis not present

## 2020-09-11 LAB — CBC WITH DIFFERENTIAL/PLATELET
Abs Immature Granulocytes: 0.02 10*3/uL (ref 0.00–0.07)
Basophils Absolute: 0.1 10*3/uL (ref 0.0–0.1)
Basophils Relative: 1 %
Eosinophils Absolute: 0.3 10*3/uL (ref 0.0–0.5)
Eosinophils Relative: 3 %
HCT: 40.4 % (ref 36.0–46.0)
Hemoglobin: 13.5 g/dL (ref 12.0–15.0)
Immature Granulocytes: 0 %
Lymphocytes Relative: 32 %
Lymphs Abs: 3.3 10*3/uL (ref 0.7–4.0)
MCH: 29.1 pg (ref 26.0–34.0)
MCHC: 33.4 g/dL (ref 30.0–36.0)
MCV: 87.1 fL (ref 80.0–100.0)
Monocytes Absolute: 0.5 10*3/uL (ref 0.1–1.0)
Monocytes Relative: 5 %
Neutro Abs: 6.2 10*3/uL (ref 1.7–7.7)
Neutrophils Relative %: 59 %
Platelets: 285 10*3/uL (ref 150–400)
RBC: 4.64 MIL/uL (ref 3.87–5.11)
RDW: 15.1 % (ref 11.5–15.5)
WBC: 10.4 10*3/uL (ref 4.0–10.5)
nRBC: 0 % (ref 0.0–0.2)

## 2020-09-11 LAB — COMPREHENSIVE METABOLIC PANEL
ALT: 11 U/L (ref 0–44)
AST: 13 U/L — ABNORMAL LOW (ref 15–41)
Albumin: 4.3 g/dL (ref 3.5–5.0)
Alkaline Phosphatase: 87 U/L (ref 38–126)
Anion gap: 10 (ref 5–15)
BUN: 14 mg/dL (ref 6–20)
CO2: 33 mmol/L — ABNORMAL HIGH (ref 22–32)
Calcium: 9.5 mg/dL (ref 8.9–10.3)
Chloride: 96 mmol/L — ABNORMAL LOW (ref 98–111)
Creatinine, Ser: 0.83 mg/dL (ref 0.44–1.00)
GFR, Estimated: >60 mL/min (ref >60)
Glucose, Bld: 124 mg/dL — ABNORMAL HIGH (ref 70–99)
Potassium: 3.9 mmol/L (ref 3.5–5.1)
Sodium: 139 mmol/L (ref 135–145)
Total Bilirubin: 0.3 mg/dL (ref 0.3–1.2)
Total Protein: 7.3 g/dL (ref 6.5–8.1)

## 2020-09-11 LAB — SARS CORONAVIRUS 2 (TAT 6-24 HRS): SARS Coronavirus 2: NEGATIVE

## 2020-09-11 LAB — LACTATE DEHYDROGENASE: LDH: 96 U/L — ABNORMAL LOW (ref 98–192)

## 2020-09-11 NOTE — Progress Notes (Signed)
[facility] OFFICE PROGRESS NOTE  Patient Care Team: [name], NP as PCP - General (Internal Medicine) Dr. Leonides Schanz (Psychiatry) Dr. Kenton Kingfisher (Gynecology) Tawni Millers, MD (Internal Medicine) Vanita Ingles, RN as Registered Nurse ([place]) [place], Virl Diamond, [place] as Pharmacist (Pharmacist) Rebekah Chesterfield, LCSW as Social Worker (Licensed Clinical Social Worker)   # HEMATOLOGY HISTORY:  # LEUCOCYTOSIS- WBC-; N; L; Hb- platelets   Oncology History   No history exists.      INTERVAL HISTORY:  Brandi Calderon 55 y.o.  female pleasant patient above history of smoking has been referred to [place] for further evaluation recommendations for elevated white blood count.  Patient has chronic lung disease/smoking complains of chronic cough chronic shortness of breath.  Patient is awaiting to get her lung cancer screening CT scan.  And in the process noted to have elevated white count.   Night sweats: None Weight loss: None Early satiety: None  Infections:NONE Splenectomy: NONE Steroids: NONE Smoke: active smoker Allergies: None Skin rash: None   Review of Systems  Constitutional:  Positive for malaise/fatigue. Negative for chills, diaphoresis, fever and weight loss.  HENT:  Negative for nosebleeds and sore throat.   Eyes:  Negative for double vision.  Respiratory:  Positive for cough and shortness of breath. Negative for hemoptysis, sputum production and wheezing.   Cardiovascular:  Negative for chest pain, palpitations, orthopnea and leg swelling.  Gastrointestinal:  Negative for abdominal pain, blood in stool, constipation, diarrhea, heartburn, melena, nausea and vomiting.  Genitourinary:  Negative for dysuria, frequency and urgency.  Musculoskeletal:  Positive for back pain. Negative for joint pain.  Skin: Negative.  Negative for itching and rash.  Neurological:  Negative for dizziness, tingling, focal weakness, weakness and headaches.   Endo/Heme/Allergies:  Does not bruise/bleed easily.  Psychiatric/Behavioral:  Negative for depression. The patient is not nervous/anxious and does not have insomnia.      PAST MEDICAL HISTORY :  Past Medical History:  Diagnosis Date   Anxiety    Arthritis    Asthma    Back pain    COPD (chronic obstructive pulmonary disease) (HCC)    Depression    Diabetes mellitus    Headache    History of COVID-19    Hyperlipidemia    Hypertension    Tendonitis     PAST SURGICAL HISTORY :   Past Surgical History:  Procedure Laterality Date   CESAREAN SECTION     CESAREAN SECTION     3 times   cyst on neck     as a baby/right side    FAMILY HISTORY :   Family History  Problem Relation Age of Onset   Kidney disease Father    Diabetes Father    Dementia Father    Diabetes Sister    Diabetes Brother    Diabetes Mother    Diabetes Brother     SOCIAL HISTORY:   Social History   Tobacco Use   Smoking status: Every Day    Packs/day: 3.00    Years: 33.00    Pack years: 99.00    Types: Cigarettes   Smokeless tobacco: Never  Vaping Use   Vaping Use: Never used  Substance Use Topics   Alcohol use: No   Drug use: Yes    Types: Marijuana    Comment: States she smokes MJ when she does not have any Tramadol.    ALLERGIES:  is allergic to penicillins, victoza [liraglutide], and vicodin [hydrocodone-acetaminophen].  MEDICATIONS:  Current Outpatient Medications  Medication Sig Dispense Refill   albuterol (VENTOLIN HFA) 108 (90 Base) MCG/ACT inhaler Inhale 1-2 puffs into the lungs every 6 (six) hours as needed for wheezing or shortness of breath. 6.7 each 1   Aspirin-Salicylamide-Caffeine (BC HEADACHE POWDER PO) Take 4 packets by mouth daily as needed (pain).     atorvastatin (LIPITOR) 20 MG tablet Take 1 tablet (20 mg total) by mouth daily at 6 PM. 90 tablet 0   buPROPion (WELLBUTRIN XL) 150 MG 24 hr tablet Take 150 mg by mouth daily.     clonazePAM (KLONOPIN) 0.5 MG tablet  Take 0.5 mg by mouth 2 (two) times daily as needed for anxiety (Sometimes takes 3 tablets a day).      DULoxetine (CYMBALTA) 60 MG capsule Take 60 mg by mouth daily.     Fluticasone-Umeclidin-Vilant (TRELEGY ELLIPTA) 100-62.5-25 MCG/INH AEPB Inhale 1 puff into the lungs daily. 30 each 5   furosemide (LASIX) 40 MG tablet TAKE 1 TABLET BY MOUTH EVERY DAY 30 tablet 1   gabapentin (NEURONTIN) 300 MG capsule Take by mouth 3 (three) times daily as needed.     JARDIANCE 25 MG TABS tablet Take 25 mg by mouth daily.     medroxyPROGESTERone (PROVERA) 10 MG tablet Take 1 tablet (10 mg total) by mouth daily. 30 tablet 11   metFORMIN (GLUCOPHAGE) 1000 MG tablet TAKE ONE TABLET BY MOUTH 2 TIMES A DAY WITH A MEAL 180 tablet 1   omega-3 acid ethyl esters (LOVAZA) 1 g capsule TAKE 1 CAPSULE (1 G TOTAL) BY MOUTH 2 (TWO) TIMES DAILY. 180 capsule 3   OneTouch Delica Lancets 16X MISC      ONETOUCH VERIO test strip      pantoprazole (PROTONIX) 40 MG tablet TAKE 1 TABLET BY MOUTH EVERY DAY 90 tablet 1   TRULICITY 4.5 WR/6.0AV SOPN Inject 0.5 mLs (4.5 mg total) subcutaneously every 7 (seven) days     fluticasone (FLONASE) 50 MCG/ACT nasal spray SPRAY 2 SPRAYS INTO EACH NOSTRIL EVERY DAY 16 mL 1   No current facility-administered medications for this visit.    PHYSICAL EXAMINATION:  BP 107/68   Pulse 72   Temp 98.2 F (36.8 C) (Tympanic)   Resp 20   There were no vitals filed for this visit.  Physical Exam Constitutional:      Comments: Ambulating independently/assistive devices.  Alone/family  HENT:     Head: Normocephalic and atraumatic.     Mouth/Throat:     Pharynx: No oropharyngeal exudate.  Eyes:     Pupils: Pupils are equal, round, and reactive to light.  Cardiovascular:     Rate and Rhythm: Normal rate and regular rhythm.  Pulmonary:     Effort: No respiratory distress.     Breath sounds: No wheezing.     Comments: Decreased breath sounds bilaterally at bases.  No wheeze or  crackles Abdominal:     General: Bowel sounds are normal. There is no distension.     Palpations: Abdomen is soft. There is no mass.     Tenderness: no abdominal tenderness There is no guarding or rebound.  Musculoskeletal:        General: No tenderness. Normal range of motion.     Cervical back: Normal range of motion and neck supple.  Skin:    General: Skin is warm.  Neurological:     Mental Status: She is alert and oriented to person, place, and time.  Psychiatric:  Mood and Affect: Affect normal.       LABORATORY DATA:  I have reviewed the data as listed    Component Value Date/Time   NA 139 09/11/2020 1448   NA 139 06/25/2017 1027   NA 134 (L) 12/07/2012 1809   K 3.9 09/11/2020 1448   K 3.4 (L) 12/07/2012 1809   CL 96 (L) 09/11/2020 1448   CL 103 12/07/2012 1809   CO2 33 (H) 09/11/2020 1448   CO2 25 12/07/2012 1809   GLUCOSE 124 (H) 09/11/2020 1448   GLUCOSE 274 (H) 12/07/2012 1809   BUN 14 09/11/2020 1448   BUN 10 06/25/2017 1027   BUN 10 12/07/2012 1809   CREATININE 0.83 09/11/2020 1448   CREATININE 0.81 09/07/2019 1019   CALCIUM 9.5 09/11/2020 1448   CALCIUM 8.6 12/07/2012 1809   PROT 7.3 09/11/2020 1448   PROT 6.7 06/25/2017 1027   PROT 7.3 12/07/2012 1809   ALBUMIN 4.3 09/11/2020 1448   ALBUMIN 4.3 06/25/2017 1027   ALBUMIN 3.5 12/07/2012 1809   AST 13 (L) 09/11/2020 1448   AST 13 (L) 12/07/2012 1809   ALT 11 09/11/2020 1448   ALT 25 12/07/2012 1809   ALKPHOS 87 09/11/2020 1448   ALKPHOS 157 (H) 12/07/2012 1809   BILITOT 0.3 09/11/2020 1448   BILITOT 0.3 06/25/2017 1027   BILITOT 0.2 12/07/2012 1809   GFRNONAA >60 09/11/2020 1448   GFRNONAA 84 09/07/2019 1019   GFRAA 97 09/07/2019 1019    No results found for: SPEP, UPEP  Lab Results  Component Value Date   WBC 10.4 09/11/2020   NEUTROABS 6.2 09/11/2020   HGB 13.5 09/11/2020   HCT 40.4 09/11/2020   MCV 87.1 09/11/2020   PLT 285 09/11/2020      Chemistry      Component Value  Date/Time   NA 139 09/11/2020 1448   NA 139 06/25/2017 1027   NA 134 (L) 12/07/2012 1809   K 3.9 09/11/2020 1448   K 3.4 (L) 12/07/2012 1809   CL 96 (L) 09/11/2020 1448   CL 103 12/07/2012 1809   CO2 33 (H) 09/11/2020 1448   CO2 25 12/07/2012 1809   BUN 14 09/11/2020 1448   BUN 10 06/25/2017 1027   BUN 10 12/07/2012 1809   CREATININE 0.83 09/11/2020 1448   CREATININE 0.81 09/07/2019 1019   GLU 288 04/05/2015 0000      Component Value Date/Time   CALCIUM 9.5 09/11/2020 1448   CALCIUM 8.6 12/07/2012 1809   ALKPHOS 87 09/11/2020 1448   ALKPHOS 157 (H) 12/07/2012 1809   AST 13 (L) 09/11/2020 1448   AST 13 (L) 12/07/2012 1809   ALT 11 09/11/2020 1448   ALT 25 12/07/2012 1809   BILITOT 0.3 09/11/2020 1448   BILITOT 0.3 06/25/2017 1027   BILITOT 0.2 12/07/2012 1809       RADIOGRAPHIC STUDIES: I have personally reviewed the radiological images as listed and agreed with the findings in the report. No results found.   ASSESSMENT & PLAN:  Neutrophilia #Neutrophilia/leukocytosis [mild 12- 15,000-TLC]-suspect secondary/reactive.   I had a Long discussion with the patient/daughter regarding multiple etiologies of elevated white count including but not limited to infection; inflammation; malignancy/leukemia etc.  Clinically suspicion for malignancy is small.  Recommend checking CBC CMP CRP LDH; BCR ABL FISH.  Review of peripheral smear shows mature neutrophils/no concerns for any blasts or immature cells.  #Active smoker: Discussed with the patient regarding the ill effects of smoking- including but not limited to  cardiac lung and vascular diseases and malignancies. Counseled against smoking at length. Awaiting lung cancer screening CT scan.   # Thank you Ms. [name] NP for allowing me to participate in the care of your pleasant patient. Please do not hesitate to contact me with questions or concerns in the interim.  # DISPOSITION: # blood work today # Follow up TBD-Dr.B   Orders  Placed This Encounter  Procedures   CBC with Differential    Standing Status:   Future    Number of Occurrences:   1    Standing Expiration Date:   09/11/2021   Comprehensive metabolic panel    Standing Status:   Future    Number of Occurrences:   1    Standing Expiration Date:   09/11/2021   Lactate dehydrogenase    Standing Status:   Future    Number of Occurrences:   1    Standing Expiration Date:   09/11/2021   BCR-ABL1 FISH    Standing Status:   Future    Number of Occurrences:   1    Standing Expiration Date:   09/11/2021   All questions were answered. The patient knows to call the clinic with any problems, questions or concerns.      Cammie Sickle, MD 09/12/2020 9:35 AM

## 2020-09-11 NOTE — Assessment & Plan Note (Addendum)
#  Neutrophilia/leukocytosis [mild 12- 15,000-TLC]-suspect secondary/reactive.   I had a Long discussion with the patient/daughter regarding multiple etiologies of elevated white count including but not limited to infection; inflammation; malignancy/leukemia etc.  Clinically suspicion for malignancy is small.  Recommend checking CBC CMP CRP LDH; BCR ABL FISH.  Review of peripheral smear shows mature neutrophils/no concerns for any blasts or immature cells.  #Active smoker: Discussed with the patient regarding the ill effects of smoking- including but not limited to cardiac lung and vascular diseases and malignancies. Counseled against smoking at length. Awaiting lung cancer screening CT scan.   # Thank you Ms. [name] NP for allowing me to participate in the care of your pleasant patient. Please do not hesitate to contact me with questions or concerns in the interim.  # DISPOSITION: # blood work today # Follow up TBD-Dr.B

## 2020-09-12 ENCOUNTER — Other Ambulatory Visit: Payer: Self-pay | Admitting: Internal Medicine

## 2020-09-12 ENCOUNTER — Ambulatory Visit: Payer: Medicare HMO | Attending: Primary Care

## 2020-09-14 LAB — BCR-ABL1 FISH
Cells Analyzed: 200
Cells Counted: 200

## 2020-09-18 ENCOUNTER — Telehealth: Payer: Medicaid Other

## 2020-09-18 ENCOUNTER — Telehealth: Payer: Self-pay | Admitting: General Practice

## 2020-09-18 NOTE — Telephone Encounter (Signed)
  Care Management   Follow Up Note   09/18/2020 Name: Brandi Calderon MRN: [id number] DOB: March 29, 1966   Referred by: [name], NP Reason for referral : Chronic Care Management (RNCM: Follow up for Chronic Disease Management and Care Coordination Needs )   An unsuccessful telephone outreach was attempted today. The patient was referred to the case management team for assistance with care management and care coordination. The patient actually answered 2 times today. She was getting her TV fixed early this am and ask for a call in 2 hours. When returned the call the patient states that her air conditioner had been out since Friday and the repair person was there. Ask for rescheduled appointment.   Follow Up Plan: Telephone follow up appointment with care management team member scheduled for: 10-09-2020 at 11:45 am  Noreene Larsson RN, MSN, [place] Mobile: [phone]

## 2020-09-19 ENCOUNTER — Telehealth: Payer: Self-pay | Admitting: Internal Medicine

## 2020-09-19 NOTE — Telephone Encounter (Signed)
On 6/21-unable to reach the patient; left a voicemail for the patient that elevated white count is from her smoking/reactive no concerns for cancer.  Recommend quitting smoking follow-up with PCP.  Follow-up with [place] only as needed  GB

## 2020-09-20 LAB — MICROALBUMIN, URINE: Microalb, Ur: 24.8

## 2020-09-27 ENCOUNTER — Telehealth: Payer: Medicaid Other

## 2020-09-28 ENCOUNTER — Other Ambulatory Visit: Payer: Self-pay | Admitting: Internal Medicine

## 2020-09-28 ENCOUNTER — Telehealth: Payer: Self-pay | Admitting: Internal Medicine

## 2020-09-28 NOTE — Telephone Encounter (Signed)
Patient is aware of date/time of covid test prior to PFT.  

## 2020-09-29 ENCOUNTER — Other Ambulatory Visit
Admission: RE | Admit: 2020-09-29 | Discharge: 2020-09-29 | Disposition: A | Discharge status: Home / Self Care | Payer: Medicare HMO | Source: Ambulatory Visit | Attending: Primary Care | Admitting: Primary Care

## 2020-09-29 ENCOUNTER — Other Ambulatory Visit: Payer: Self-pay

## 2020-09-29 DIAGNOSIS — Z20822 Contact with and (suspected) exposure to covid-19: Secondary | ICD-10-CM | POA: Insufficient documentation

## 2020-09-29 DIAGNOSIS — Z01812 Encounter for preprocedural laboratory examination: Secondary | ICD-10-CM | POA: Diagnosis not present

## 2020-09-29 LAB — SARS CORONAVIRUS 2 (TAT 6-24 HRS): SARS Coronavirus 2: NEGATIVE

## 2020-10-03 ENCOUNTER — Other Ambulatory Visit: Payer: Self-pay

## 2020-10-03 ENCOUNTER — Ambulatory Visit: Payer: Medicare HMO

## 2020-10-04 ENCOUNTER — Ambulatory Visit: Payer: Medicare HMO | Attending: Primary Care

## 2020-10-04 DIAGNOSIS — F17210 Nicotine dependence, cigarettes, uncomplicated: Secondary | ICD-10-CM | POA: Diagnosis not present

## 2020-10-04 DIAGNOSIS — J449 Chronic obstructive pulmonary disease, unspecified: Secondary | ICD-10-CM | POA: Diagnosis not present

## 2020-10-04 DIAGNOSIS — R69 Illness, unspecified: Secondary | ICD-10-CM | POA: Diagnosis not present

## 2020-10-06 ENCOUNTER — Other Ambulatory Visit: Payer: Self-pay | Admitting: Internal Medicine

## 2020-10-06 NOTE — Progress Notes (Signed)
PFTs showed severe obstructive airway disease (COPD). She has an apt with Dr. Mortimer Fries 7/12 and will review in more detail at that time

## 2020-10-09 ENCOUNTER — Telehealth: Payer: Medicaid Other | Admitting: General Practice

## 2020-10-09 ENCOUNTER — Ambulatory Visit (INDEPENDENT_AMBULATORY_CARE_PROVIDER_SITE_OTHER): Payer: Medicare HMO | Admitting: General Practice

## 2020-10-09 DIAGNOSIS — Z794 Long term (current) use of insulin: Secondary | ICD-10-CM

## 2020-10-09 DIAGNOSIS — J449 Chronic obstructive pulmonary disease, unspecified: Secondary | ICD-10-CM

## 2020-10-09 DIAGNOSIS — E1169 Type 2 diabetes mellitus with other specified complication: Secondary | ICD-10-CM | POA: Diagnosis not present

## 2020-10-09 DIAGNOSIS — F419 Anxiety disorder, unspecified: Secondary | ICD-10-CM

## 2020-10-09 DIAGNOSIS — R69 Illness, unspecified: Secondary | ICD-10-CM | POA: Diagnosis not present

## 2020-10-09 DIAGNOSIS — F32A Depression, unspecified: Secondary | ICD-10-CM

## 2020-10-09 DIAGNOSIS — R0602 Shortness of breath: Secondary | ICD-10-CM

## 2020-10-09 MED ORDER — GLUCOSE BLOOD VI STRP: ORAL_STRIP | 0 refills | Status: AC

## 2020-10-09 MED ORDER — ONETOUCH ULTRASOFT LANCETS MISC: 12 refills | Status: AC

## 2020-10-09 MED ORDER — ONETOUCH ULTRALINK W/DEVICE KIT: 1.0000 | PACK | Freq: Every day | 0 refills | Status: DC

## 2020-10-09 NOTE — Addendum Note (Signed)
Addended by: [name] on: 10/09/2020 02:30 PM   Modules accepted: Orders

## 2020-10-09 NOTE — Chronic Care Management (AMB) (Signed)
Chronic Care Management   CCM RN Visit Note  10/09/2020 Name: Brandi Calderon MRN: [id number] DOB: 01/02/1966  Subjective: Brandi Calderon is a 55 y.o. year old female who is a primary care patient of [name], NP. The care management team was consulted for assistance with disease management and care coordination needs.    Engaged with patient by telephone for follow up visit in response to provider referral for case management and/or care coordination services.   Consent to Services:  The patient was given information about Chronic Care Management services, agreed to services, and gave verbal consent prior to initiation of services.  Please see initial visit note for detailed documentation.   Patient agreed to services and verbal consent obtained.   Assessment: Review of patient past medical history, allergies, medications, health status, including review of consultants reports, laboratory and other test data, was performed as part of comprehensive evaluation and provision of chronic care management services.   SDOH (Social Determinants of Health) assessments and interventions performed:    CCM Care Plan  Allergies  Allergen Reactions   Penicillins Anaphylaxis    Has patient had a PCN reaction causing immediate rash, facial/tongue/throat swelling, SOB or lightheadedness with hypotension: Yes Has patient had a PCN reaction causing severe rash involving mucus membranes or skin necrosis: No Has patient had a PCN reaction that required hospitalization: No Has patient had a PCN reaction occurring within the last 10 years: No If all of the above answers are "NO", then may proceed with Cephalosporin use.   Throat swells   Victoza [Liraglutide]     Severe nausea   Vicodin [Hydrocodone-Acetaminophen] Rash    And hives    Outpatient Encounter Medications as of 10/09/2020  Medication Sig   albuterol (VENTOLIN HFA) 108 (90 Base) MCG/ACT inhaler Inhale 1-2 puffs into the lungs  every 6 (six) hours as needed for wheezing or shortness of breath.   Aspirin-Salicylamide-Caffeine (BC HEADACHE POWDER PO) Take 4 packets by mouth daily as needed (pain).   atorvastatin (LIPITOR) 20 MG tablet Take 1 tablet (20 mg total) by mouth daily at 6 PM.   buPROPion (WELLBUTRIN XL) 150 MG 24 hr tablet Take 150 mg by mouth daily.   clonazePAM (KLONOPIN) 0.5 MG tablet Take 0.5 mg by mouth 2 (two) times daily as needed for anxiety (Sometimes takes 3 tablets a day).    DULoxetine (CYMBALTA) 60 MG capsule Take 60 mg by mouth daily.   fluticasone (FLONASE) 50 MCG/ACT nasal spray SPRAY 2 SPRAYS INTO EACH NOSTRIL EVERY DAY   Fluticasone-Umeclidin-Vilant (TRELEGY ELLIPTA) 100-62.5-25 MCG/INH AEPB Inhale 1 puff into the lungs daily.   furosemide (LASIX) 40 MG tablet TAKE 1 TABLET BY MOUTH EVERY DAY   gabapentin (NEURONTIN) 300 MG capsule Take by mouth 3 (three) times daily as needed.   JARDIANCE 25 MG TABS tablet Take 25 mg by mouth daily.   medroxyPROGESTERone (PROVERA) 10 MG tablet Take 1 tablet (10 mg total) by mouth daily.   metFORMIN (GLUCOPHAGE) 1000 MG tablet TAKE ONE TABLET BY MOUTH 2 TIMES A DAY WITH A MEAL   omega-3 acid ethyl esters (LOVAZA) 1 g capsule TAKE 1 CAPSULE (1 G TOTAL) BY MOUTH 2 (TWO) TIMES DAILY.   OneTouch Delica Lancets 10G MISC    ONETOUCH VERIO test strip    pantoprazole (PROTONIX) 40 MG tablet TAKE 1 TABLET BY MOUTH EVERY DAY   TRULICITY 4.5 YI/9.4WN SOPN Inject 0.5 mLs (4.5 mg total) subcutaneously every 7 (seven) days   No  facility-administered encounter medications on file as of 10/09/2020.    Patient Active Problem List   Diagnosis Date Noted   Snoring 08/30/2020   Neutrophilia 08/30/2020   Smoker 08/30/2020   Mixed simple and mucopurulent chronic bronchitis ([place]) 07/26/2020   Gastroesophageal reflux disease without esophagitis 07/26/2020   Hyperlipidemia associated with type 2 diabetes mellitus ([place]) 05/24/2020   Anxiety and depression 07/23/2018   Chronic  pain syndrome 07/23/2018   Neuropathy due to type 2 diabetes mellitus ([place]) - severe 12/19/2015   Type 2 diabetes mellitus with other specified complication ([place]) 09/38/1829    Conditions to be addressed/monitored:COPD, DMII, Anxiety, and Depression  Care Plan : RNCM: COPD (Adult)  Updates made by Vanita Ingles since 10/09/2020 12:00 AM     Problem: RNCM: Psychological Adjustment to Diagnosis (COPD)   Priority: High     Long-Range Goal: RNCM: COPD Adjustment to Disease Achieved   Start Date: 04/17/2020  Expected End Date: 08/05/2021  This Visit's Progress: On track  Priority: High  Note:   Current Barriers:  Knowledge deficits related to basic understanding of COPD disease process Knowledge deficits related to basic COPD self care/management Knowledge deficit related to basic understanding of how to use inhalers and how inhaled medications work Knowledge deficit related to importance of energy conservation Limited Social Support Unable to independently manage COPD Lacks social connections Unable to perform IADLs independently Does not maintain contact with provider office Does not contact provider office for questions/concerns  Case Manager Clinical Goal(s): patient will report using inhalers as prescribed including rinsing mouth after use patient will report utilizing pursed lip breathing for shortness of breath patient will verbalize understanding of COPD action plan and when to seek appropriate levels of medical care patient will engage in lite exercise as tolerated to build/regain stamina and strength and reduce shortness of breath through activity tolerance patient will verbalize basic understanding of COPD disease process and self care activities patient will not be hospitalized for COPD exacerbation as evidenced  Interventions:  Collaboration with [name], NP regarding development and update of comprehensive plan of care as evidenced by provider attestation and  co-signature Inter-disciplinary care team collaboration (see longitudinal plan of care) Provided patient with basic written and verbal COPD education on self care/management/and exacerbation prevention  Provided patient with COPD action plan and reinforced importance of daily self assessment Provided written and verbal instructions on pursed lip breathing and utilized returned demonstration as teach back Provided instruction about proper use of medications used for management of COPD including inhalers Advised patient to self assesses COPD action plan zone and make appointment with provider if in the yellow zone for 48 hours without improvement. Provided patient with education about the role of exercise in the management of COPD Advised patient to engage in light exercise as tolerated 3-5 days a week Provided education about and advised patient to utilize infection prevention strategies to reduce risk of respiratory infection  Self-Care Activities:  Patient verbalizes understanding of plan to effectively manage COPD Self administers medications as prescribed Attends all scheduled provider appointments Calls pharmacy for medication refills Attends church or other social activities Performs ADL's independently Performs IADL's independently Calls provider office for new concerns or questions Patient Goals: - do breathing exercises at least 2 times each day - do exercises in a comfortable position that makes breathing as easy as possible - develop a new routine to improve sleep - don't eat or exercise right before bedtime - eat healthy - get at least  7 to 8 hours of sleep at night - get outdoors every day (weather permitting) - keep room cool and dark - limit daytime naps - practice relaxation or meditation daily - use a fan or white noise in bedroom - use devices that will help like a cane, sock-puller or reacher - develop a rescue plan - eliminate symptom triggers at home - follow  rescue plan if symptoms flare-up - keep follow-up appointments - use an extra pillow to sleep - avoid second hand smoke - eliminate smoking in my home - identify and avoid work-related triggers - identify and remove indoor air pollutants - limit outdoor activity during cold weather - listen for public air quality announcements every day Follow Up Plan: Telephone follow up appointment with care management team member scheduled for: 11-27-2020 at 1145 am    Task: RNCM: Support Psychosocial Response to Chronic Obstructive Pulmonary Disease Completed 10/09/2020  Outcome: Positive  Note:   Care Management Activities:    - caregiver stress acknowledged - caregiver support provided - counseling provided - decision-making supported - depression screen reviewed - emotional support provided - problem-solving facilitated - verbalization of feelings encouraged        Care Plan : RNCM: Diabetes Type 2 (Adult)  Updates made by Vanita Ingles since 10/09/2020 12:00 AM     Problem: RNCM: Glycemic Management (Diabetes, Type 2)   Priority: High     Long-Range Goal: RNCM: Glycemic Management Optimized   Start Date: 04/13/2020  Expected End Date: 08/05/2021  This Visit's Progress: Not on track  Priority: High  Note:   Objective:  Lab Results  Component Value Date   HGBA1C 7.7 (A) 09/07/2019   HGBA1C 7.7 09/07/2019   HGBA1C 7.7 (A) 09/07/2019     Hemoglobin A1C at endocrinology 12-23-2019 was 8.9, Hemoglobin A1C 05-2020: 8.8- results form [facility]. 10-09-2020" Last record shows hemoglobin A1C on 09-07-2020 was 7.6 Lab Results  Component Value Date   CREATININE 0.81 09/07/2019   CREATININE 0.74 01/04/2019   CREATININE 0.77 10/10/2017   No results found for: EGFR Current Barriers:  Knowledge Deficits related to basic Diabetes pathophysiology and self care/management Knowledge Deficits related to medications used for management of diabetes Limited Social Support Unable to  independently manage DM Unable to self administer medications as prescribed Does not contact provider office for questions/concerns DOES NOT have a blood glucose meter to check CBG's  Case Manager Clinical Goal(s):  Collaboration with [name], NP regarding development and update of comprehensive plan of care as evidenced by provider attestation and co-signature Inter-disciplinary care team collaboration (see longitudinal plan of care) Over the next 120 days, patient will demonstrate improved adherence to prescribed treatment plan for diabetes self care/management as evidenced by:  daily monitoring and recording of CBG  adherence to ADA/ carb modified diet adherence to prescribed medication regimen Interventions:  Provided education to patient about basic DM disease process Reviewed medications with patient and discussed importance of medication adherence. 10-09-2020: States she is compliant with her medications but is not checking her blood sugars due to not being able to find her meter.  Discussed plans with patient for ongoing care management follow up and provided patient with direct contact information for care management team Provided patient with written educational materials related to hypo and hyperglycemia and importance of correct treatment. 07-03-2020: The patient states that she has not had any new hypoglycemic events. The patient denies any since the episode several months ago when she was riding with her  daughter. The patient states that she thinks her provider adding the Lasix has helped her with her blood sugars also. Education and support given. The patient states that her blood sugar this am was 145. 10-09-2020:The patient states she cannot find her meter and cannot take her blood sugars at this time. She also states she needs a new endocrinologist and ask for a consult. Will send and inbasket message to the pcp and pharm D for assistance. Expressed the importance of follow up  for DM and effective control.  Review of patient status, including review of consultants reports, relevant laboratory and other test results, and medications completed. Patient Goals/Self-Care Activities Over the next 120 days, patient will:  - UNABLE to independently manage DM Self administers oral medications as prescribed Attends all scheduled provider appointments Checks blood sugars as prescribed and utilize hyper and hypoglycemia protocol as needed Adheres to prescribed ADA/carb modified - barriers to adherence to treatment plan identified - blood glucose monitoring encouraged - blood glucose readings reviewed - resources required to improve adherence to care identified - self-awareness of signs/symptoms of hypo or hyperglycemia encouraged - use of blood glucose monitoring log promoted Follow Up Plan: Telephone follow up appointment with care management team member scheduled for: 11-27-2020 at 1145 am    Task: RNCM: Alleviate Barriers to Glycemic Management Completed 10/09/2020  Outcome: Positive  Note:   Care Management Activities:    - barriers to adherence to treatment plan identified - blood glucose monitoring encouraged - blood glucose readings reviewed - resources required to improve adherence to care identified - self-awareness of signs/symptoms of hypo or hyperglycemia encouraged - use of blood glucose monitoring log promoted        Care Plan : RNCM: Depression (Adult)  Updates made by Vanita Ingles since 10/09/2020 12:00 AM     Problem: RNCM: Depression Identification (Depression)   Priority: High     Long-Range Goal: RNCM: Depressive and anxiety  Symptoms Identified   Start Date: 04/17/2020  Expected End Date: 08/05/2021  This Visit's Progress: Not on track  Priority: High  Note:   Current Barriers:  Chronic Disease Management support and education needs related to depression and anxiety Lacks caregiver support.  Unable to independently manage  depression Lacks social connections Does not contact provider office for questions/concerns  Nurse Case Manager Clinical Goal(s):  Over the next 120 days, patient will verbalize understanding of plan for effective management of anxiety and depression  Over the next 120 days, patient will work with [facility], CCM team and pcp to address needs related to depression and anxiety exacerbations Over the next 120 days, the patient will demonstrate ongoing self health care management ability as evidenced by taking medications as directed, following the plan of care and working with the CCM team to optimize health and well being.   Interventions:  1:1 collaboration with [name], NP regarding development and update of comprehensive plan of care as evidenced by provider attestation and co-signature Inter-disciplinary care team collaboration (see longitudinal plan of care) Evaluation of current treatment plan related to anxiety and depression  and patient's adherence to plan as established by provider. 07-03-2020: The patient states she is doing okay. She has moments when she gets very anxious about things but she does what she can to calm herself down. She denies any acute distress at this time. Has a gym membership and doing well with this. She is going 2 days a week. Her daughter plans to get a gym membership so  she can go with her. Will continue to monitor for changes. 10-09-2020: The patient had a recent break up with her boyfriend and this is causing a lot of stress and her nerves are bad. Education on trying to not focus on the break up but on her health and well being and making sure she is there for her family. Her elderly father is also sick and this is impacting her depression and anxiety. Will collaborate with the LCSW for any new ideas or recommendations. She currently works with the [employer] and has follow up coming up. Will continue to monitor for changes.  Advised patient to call the office for changes  in mood, anxiety, or depression Provided education to patient re: talking to [facility] when she is in a depressed state, working with CCM team during exacerbations  Discussed plans with patient for ongoing care management follow up and provided patient with direct contact information for care management team  Patient Goals/Self-Care Activities Over the next 120 days, patient will:  - Patient will attend all scheduled provider appointments Patient will call pharmacy for medication refills Patient will attend church or other social activities Patient will call provider office for new concerns or questions Patient will work with BSW to address care coordination needs and will continue to work with the clinical team to address health care and disease management related needs.   - anxiety screen reviewed - depression screen reviewed - medication list reviewed  Follow Up Plan: Telephone follow up appointment with care management team member scheduled for: 11-27-2020 at 1145 am        Task: RNCM: Identify Depressive Symptoms and Facilitate Treatment Completed 10/09/2020  Outcome: Positive  Note:   Care Management Activities:    - anxiety screen reviewed - depression screen reviewed - medication list reviewed         Plan:Telephone follow up appointment with care management team member scheduled for:  11-27-2020 at 1145 am  [place], MSN, [place] Mobile: [phone]

## 2020-10-09 NOTE — Patient Instructions (Signed)
Visit Information  PATIENT GOALS:  Goals Addressed             This Visit's Progress    RNCM: Manage Fatigue (Tiredness-COPD)       Follow Up Date 11/27/2020    - develop a new routine to improve sleep - don't eat or exercise right before bedtime - eat healthy - get at least 7 to 8 hours of sleep at night - get outdoors every day (weather permitting) - keep room cool and dark - limit daytime naps - practice relaxation or meditation daily - use a fan or white noise in bedroom    Why is this important?   Feeling tired or worn out is a common symptom of COPD (chronic obstructive pulmonary disease).  Learning when you feel your best and when you need rest is important.  Managing the tiredness (fatigue) will help you be active and enjoy life.     Notes: The patient states since [place] she is having a hard time with her COPD and overall health.[facility] specialist on 10-10-2020      RNCM: Monitor and Manage My Blood Sugar-Diabetes Type 2       Timeframe:  Long-Range Goal Priority:  High Start Date:     10-09-2020                        Expected End Date:   10-09-2021                    Follow Up Date 11/27/2020    - check blood sugar at prescribed times - check blood sugar before and after exercise - check blood sugar if I feel it is too high or too low - enter blood sugar readings and medication or insulin into daily log - take the blood sugar log to all doctor visits - take the blood sugar meter to all doctor visits    Why is this important?   Checking your blood sugar at home helps to keep it from getting very high or very low.  Writing the results in a diary or log helps the doctor know how to care for you.  Your blood sugar log should have the time, date and the results.  Also, write down the amount of insulin or other medicine that you take.  Other information, like what you ate, exercise done and how you were feeling, will also be helpful.     Notes: 10-09-2020: The  patient currently does not have a meter to check her blood sugars. Will request a meter today from pcp and ask pharmacist for assistance.  Patient also needs an endocrinologist consult.       RNCM: Track and Manage My Symptoms-Depression       Timeframe:  Long-Range Goal Priority:  High Start Date:   10-09-2020                          Expected End Date:           10-09-2021            Follow Up Date 11/27/2020    - avoid negative self-talk - develop a personal safety plan - develop a plan to deal with triggers like holidays, anniversaries - exercise at least 2 to 3 times per week - have a plan for how to handle bad days - journal feelings and what helps to feel better or worse -  spend time or talk with others at least 2 to 3 times per week - spend time or talk with others every day - watch for early signs of feeling worse - write in journal every day    Why is this important?   Keeping track of your progress will help your treatment team find the right mix of medicine and therapy for you.  Write in your journal every day.  Day-to-day changes in depression symptoms are normal. It may be more helpful to check your progress at the end of each week instead of every day.     Notes: Patient states that her and her boyfriend recently broke up and it is causing her a lot of stress and making her nerves bad. Empathetic listening and support. Also her elderly father is sick and she is concerned about him. Education provided.       RNCM: Track and Manage My Triggers-COPD       Timeframe:  Long-Range Goal Priority:  High Start Date:   10-09-2020                          Expected End Date:          10-09-2021             Follow Up Date 11/27/2020    - avoid second hand smoke - eliminate smoking in my home - identify and avoid work-related triggers - identify and remove indoor air pollutants - limit outdoor activity during cold weather - listen for public air quality announcements every day     Why is this important?   Triggers are activities or things, like tobacco smoke or cold weather, that make your COPD (chronic obstructive pulmonary disease) flare-up.  Knowing these triggers helps you plan how to stay away from them.  When you cannot remove them, you can learn how to manage them.     Notes: The patient continues to smoke. Talked about smoking cessation. The patient had a breathing test and is seeing a specialist on 10-10-2020. States she believes she has sleep apnea. Will have a test for sleep apnea coming up. Her daughter will go with her to her appointment tomorrow.          Patient verbalizes understanding of instructions provided today and agrees to view in [place].   Telephone follow up appointment with care management team member scheduled for:11-27-2020 at 1145 am  [place], MSN, [place] Mobile: [phone]

## 2020-10-10 ENCOUNTER — Encounter: Payer: Self-pay | Admitting: Internal Medicine

## 2020-10-10 ENCOUNTER — Ambulatory Visit (INDEPENDENT_AMBULATORY_CARE_PROVIDER_SITE_OTHER): Payer: Medicare HMO | Admitting: Internal Medicine

## 2020-10-10 ENCOUNTER — Other Ambulatory Visit: Payer: Self-pay

## 2020-10-10 VITALS — BP 120/66 | HR 89 | Temp 98.0°F | Ht 61.0 in | Wt 131.0 lb

## 2020-10-10 DIAGNOSIS — F17210 Nicotine dependence, cigarettes, uncomplicated: Secondary | ICD-10-CM | POA: Diagnosis not present

## 2020-10-10 DIAGNOSIS — Z720 Tobacco use: Secondary | ICD-10-CM

## 2020-10-10 DIAGNOSIS — G4719 Other hypersomnia: Secondary | ICD-10-CM | POA: Diagnosis not present

## 2020-10-10 DIAGNOSIS — R69 Illness, unspecified: Secondary | ICD-10-CM | POA: Diagnosis not present

## 2020-10-10 DIAGNOSIS — J449 Chronic obstructive pulmonary disease, unspecified: Secondary | ICD-10-CM

## 2020-10-10 NOTE — Patient Instructions (Addendum)
Obtain home sleep test already ordered Obtain 6-minute walk test to assess for exertional hypoxia Obtain overnight pulse oximetry to assess for nocturnal hypoxia  Lung cancer screening referral program Continue inhalers as prescribed with Trelegy and Ventolin   Please stop smoking

## 2020-10-10 NOTE — Progress Notes (Addendum)
Name: Brandi Calderon MRN: [id number] DOB: 11-08-1965     CONSULTATION DATE: 10/10/2020  REFERRING MD : Garnette Gunner  CHIEF COMPLAINT: SOB    HPI: Patient is seen for follow-up COPD and assessment for OSA Continues to smoke Takes Trelegy inhaler therapy  Continues to have daytime somnolence  PFTs reviewed with patient Patient is 50% of lung function FEV1 predicted  Moderate to severe COPD is a diagnosis Chronic fatigue Progressive shortness of breath and dyspnea exertion I explained to patient she will need to be assessed for oxygen levels with exertion and at nighttime   Sleep questionnaire: Prior sleep study- None  Symptoms- Loud snoring, gasping for air, restless sleep, daytime fatigue  Typical bedtime- 8-9pm  Time to fall asleep- 15 mins Nocturnal awakenings- 2-3 times  Out of bed in morning- 4am  Weight changes- 10 lbs in last 2 years  Epworth- 17/24    Patient is seen today for problems and issues with sleep related to excessive daytime sleepiness Patient  has been having sleep problems for many years Patient has been having excessive daytime sleepiness for a long time Patient has been having extreme fatigue and tiredness, lack of energy +  very Loud snoring every night + struggling breathe at night and gasps for air   Discussed sleep data and reviewed with patient.  Encouraged proper weight management.  Discussed driving precautions and its relationship with hypersomnolence.  Discussed operating dangerous equipment and its relationship with hypersomnolence.  Discussed sleep hygiene, and benefits of a fixed sleep waked time.  The importance of getting eight or more hours of sleep discussed with patient.  Discussed limiting the use of the computer and television before bedtime.  Decrease naps during the day, so night time sleep will become enhanced.  Limit caffeine, and sleep deprivation.  HTN, stroke, and heart failure are potential risk factors.    EPWORTH  SLEEP SCORE-17    PAST MEDICAL HISTORY :   has a past medical history of Anxiety, Arthritis, Asthma, Back pain, COPD (chronic obstructive pulmonary disease) ([place]), Depression, Diabetes mellitus, Headache, History of COVID-19, Hyperlipidemia, Hypertension, and Tendonitis.  has a past surgical history that includes Cesarean section; Cesarean section; and cyst on neck. Prior to Admission medications   Medication Sig Start Date End Date Taking? Authorizing Provider  albuterol (VENTOLIN HFA) 108 (90 Base) MCG/ACT inhaler Inhale 1-2 puffs into the lungs every 6 (six) hours as needed for wheezing or shortness of breath. 07/17/20   Karamalegos, Devonne Doughty, DO  Aspirin-Salicylamide-Caffeine (BC HEADACHE POWDER PO) Take 4 packets by mouth daily as needed (pain).    [provider]  atorvastatin (LIPITOR) 20 MG tablet Take 1 tablet (20 mg total) by mouth daily at 6 PM. 08/14/20   [name], [name], NP  Blood Glucose Monitoring Suppl (ONETOUCH ULTRALINK) w/Device KIT 1 Device by Does not apply route daily. 10/09/20   [name], NP  buPROPion (WELLBUTRIN XL) 150 MG 24 hr tablet Take 150 mg by mouth daily. 06/22/18   [provider]  clonazePAM (KLONOPIN) 0.5 MG tablet Take 0.5 mg by mouth 2 (two) times daily as needed for anxiety (Sometimes takes 3 tablets a day).     [provider]  DULoxetine (CYMBALTA) 60 MG capsule Take 60 mg by mouth daily. 08/14/20   [provider]  fluticasone (FLONASE) 50 MCG/ACT nasal spray SPRAY 2 SPRAYS INTO EACH NOSTRIL EVERY DAY 09/12/20   [name], NP  Fluticasone-Umeclidin-Vilant (TRELEGY ELLIPTA) 100-62.5-25 MCG/INH AEPB Inhale 1  puff into the lungs daily. 01/24/20   Malfi, [name], FNP  furosemide (LASIX) 40 MG tablet TAKE 1 TABLET BY MOUTH EVERY DAY 09/28/20   [name], NP  gabapentin (NEURONTIN) 300 MG capsule Take by mouth 3 (three) times daily as needed. 11/08/19   [provider]  glucose blood test strip Use as  instructed 10/09/20   [name], NP  JARDIANCE 25 MG TABS tablet Take 25 mg by mouth daily. 05/22/20   [provider]  Lancets Glory Rosebush ULTRASOFT) lancets Use as instructed 10/09/20   [name], NP  medroxyPROGESTERone (PROVERA) 10 MG tablet Take 1 tablet (10 mg total) by mouth daily. 12/03/18   [name], NP  metFORMIN (GLUCOPHAGE) 1000 MG tablet TAKE ONE TABLET BY MOUTH 2 TIMES A DAY WITH A MEAL 01/27/20   Malfi, [name], FNP  omega-3 acid ethyl esters (LOVAZA) 1 g capsule TAKE 1 CAPSULE (1 G TOTAL) BY MOUTH 2 (TWO) TIMES DAILY. 11/18/19   Malfi, [name], FNP  pantoprazole (PROTONIX) 40 MG tablet TAKE 1 TABLET BY MOUTH EVERY DAY 02/27/20   Malfi, [name], FNP  TRULICITY 4.5 DX/4.1OI SOPN Inject 0.5 mLs (4.5 mg total) subcutaneously every 7 (seven) days 02/03/20   [provider]   Allergies  Allergen Reactions   Penicillins Anaphylaxis    Has patient had a PCN reaction causing immediate rash, facial/tongue/throat swelling, SOB or lightheadedness with hypotension: Yes Has patient had a PCN reaction causing severe rash involving mucus membranes or skin necrosis: No Has patient had a PCN reaction that required hospitalization: No Has patient had a PCN reaction occurring within the last 10 years: No If all of the above answers are "NO", then may proceed with Cephalosporin use.   Throat swells   Victoza [Liraglutide]     Severe nausea   Vicodin [Hydrocodone-Acetaminophen] Rash    And hives    FAMILY HISTORY:  family history includes Dementia in her father; Diabetes in her brother, brother, father, mother, and sister; Kidney disease in her father. SOCIAL HISTORY:  reports that she has been smoking cigarettes. She has a 99.00 pack-year smoking history. She has never used smokeless tobacco. She reports current drug use. Drug: Marijuana. She reports that she does not drink alcohol.   Review of Systems:  Gen:  Denies  fever, sweats, chills weight loss   HEENT: Denies blurred vision, double vision, ear pain, eye pain, hearing loss, nose bleeds, sore throat Cardiac:  No dizziness, chest pain or heaviness, chest tightness,edema, No JVD Resp:  +cough, +sputum production, +shortness of breath,+wheezing, -hemoptysis,  Gi: Denies swallowing difficulty, stomach pain, nausea or vomiting, diarrhea, constipation, bowel incontinence Gu:  Denies bladder incontinence, burning urine Ext:   Denies Joint pain, stiffness or swelling Skin: Denies  skin rash, easy bruising or bleeding or hives Endoc:  Denies polyuria, polydipsia , polyphagia or weight change Psych:   Denies depression, insomnia or hallucinations  Other:  All other systems negative   BP 120/66 (BP Location: Left Arm, Cuff Size: Normal)   Pulse 89   Temp 98 F (36.7 C) (Temporal)   Ht 5' 1"  (1.549 m)   Wt 131 lb (59.4 kg)   SpO2 100%   BMI 24.75 kg/m    Physical Examination:   General Appearance: No distress  Neuro:without focal findings,  speech normal,  HEENT: PERRLA, EOM intact.   Pulmonary: normal breath sounds, No wheezing.  CardiovascularNormal S1,S2.  No m/r/g.   Abdomen: Benign, Soft, non-tender.  Renal:  No costovertebral tenderness  GU:  Not performed at this time. Endoc: No evident thyromegaly, no signs of acromegaly. Skin:   warm, no rashes, no ecchymosis  Extremities: normal, no cyanosis, clubbing. NEUROLOGIC: Cranial nerves II through XII are intact. No gross focal neurological deficits.  PSYCHIATRIC: Mood, affect within normal limits.      ASSESSMENT AND PLAN SYNOPSIS  55 year old white female with active tobacco abuse with moderate to severe obstructive lung disease consistent with COPD based on pulmonary function testing with FEV1 of 50% with signs symptoms of probable underlying sleep apnea in the setting of progressive shortness of breath and dyspnea on exertion with a previous diagnosis of COVID-19 infection and pneumonia  Assessing shortness of breath  and COPD Patient has moderate to severe disease Trelegy and triple inhaler therapy seems to be helping Albuterol as needed No signs of exacerbation at this time   Underlying sleep apnea Home sleep test is pending  Progressive shortness of breath and dyspnea exertion Check 6-minute walk test to assess for exertional hypoxia Check overnight pulse oximetry to assess for nocturnal hypoxia  Smoking cessation strongly advised  Lung cancer screening referral program initiated  Patient states she may have a hernia however I do not see any evidence of hernia at this time Recommend referral from PCP to general surgery for further assessment    MEDICATION ADJUSTMENTS/LABS AND TESTS ORDERED: Obtain home sleep test already ordered Obtain 6-minute walk test to assess for exertional hypoxia Obtain overnight pulse oximetry to assess for nocturnal hypoxia  Lung cancer screening referral program Continue inhalers as prescribed with Trelegy and Ventolin   Please stop smoking   CURRENT MEDICATIONS REVIEWED AT LENGTH WITH PATIENT TODAY   Patient  satisfied with Plan of action and management. All questions answered  Follow up 6 months  Total Time Spent 38 minutes   Corrin Parker, M.D.  [facility]  Medical Director [place] Director [facility] Cardio-Pulmonary Department

## 2020-10-11 NOTE — Addendum Note (Signed)
Addended by: Carlisle Cater on: 10/11/2020 08:55 AM   Modules accepted: Orders

## 2020-10-13 ENCOUNTER — Ambulatory Visit: Payer: Medicaid Other | Admitting: Pharmacist

## 2020-10-13 ENCOUNTER — Telehealth: Payer: Self-pay | Admitting: Internal Medicine

## 2020-10-13 DIAGNOSIS — Z794 Long term (current) use of insulin: Secondary | ICD-10-CM

## 2020-10-13 DIAGNOSIS — E1169 Type 2 diabetes mellitus with other specified complication: Secondary | ICD-10-CM

## 2020-10-13 DIAGNOSIS — J449 Chronic obstructive pulmonary disease, unspecified: Secondary | ICD-10-CM

## 2020-10-13 NOTE — Telephone Encounter (Signed)
Patient is returning Anita's call.

## 2020-10-13 NOTE — Patient Instructions (Signed)
Visit Information  PATIENT GOALS:  Goals Addressed             This Visit's Progress    Pharmacy Goals       Our goal A1c is less than 7%. This corresponds with fasting sugars less than 130 and 2 hour after meal sugars less than 180. Please check your blood sugar each morning and again before bed 2-3 nights per week.  Please bring blood sugar log and/or meter to every visit with Endocrinologist  Our goal bad cholesterol, or LDL, is less than 70 . This is why it is important to continue taking your atorvastatin and omega-3 prescriptions.  Feel free to call me with any questions or concerns. I look forward to our next call!    Harlow Asa, PharmD, [place] [phone]        The patient verbalized understanding of instructions, educational materials, and care plan provided today and declined offer to receive copy of patient instructions, educational materials, and care plan.   Telephone follow up appointment with care management team member scheduled for: 8/16 at 10:30 am

## 2020-10-13 NOTE — Telephone Encounter (Signed)
I have spoke with Brandi Calderon and she is scheduled to pick up HST on 7/27/222 @ 2:00pm

## 2020-10-13 NOTE — Chronic Care Management (AMB) (Signed)
Chronic Care Management Pharmacy Note  10/13/2020 Name:  Brandi Calderon MRN:  [id number] DOB:  06-15-1965   Subjective: Brandi Calderon is an 55 y.o. year old female who is a primary patient of [name], NP.  The CCM team was consulted for assistance with disease management and care coordination needs.    Engaged with patient by telephone for follow up visit in response to provider referral for pharmacy case management and/or care coordination services.   Consent to Services:  The patient was given information about Chronic Care Management services, agreed to services, and gave verbal consent prior to initiation of services.  Please see initial visit note for detailed documentation.   Patient Care Team: [name], NP as PCP - General (Internal Medicine) Dr. Leonides Schanz (Psychiatry) Dr. Kenton Kingfisher (Gynecology) Tawni Millers, MD (Internal Medicine) Vanita Ingles, RN as Registered Nurse ([place]) [place], Virl Diamond, [place] as Pharmacist (Pharmacist) Rebekah Chesterfield, LCSW as Social Worker (Licensed Clinical Social Worker)  Recent consult visits: Office Visit with Pulmonologist on 7/12. Office Visit with [facility] on 6/13 for neutrophilia [facility] Endocr[facility]ology on 6/9.  Office Visit with [employer] [facility] on 6/1.   Hospital visits: None in previous 6 months  Objective:  Lab Results  Component Value Date   CREATININE 0.83 09/11/2020   CREATININE 0.81 09/07/2019   CREATININE 0.74 01/04/2019    Lab Results  Component Value Date   HGBA1C 7.7 (A) 09/07/2019   HGBA1C 7.7 09/07/2019   HGBA1C 7.7 (A) 09/07/2019  Latest A1C value: 7.6% on 09/07/2020 per shared record with [place]  Last diabetic Eye exam:  Lab Results  Component Value Date/Time   HMDIABEYEEXA No Retinopathy 02/21/2020 12:00 AM    Last diabetic Foot exam: No results found for: HMDIABFOOTEX    Social History    Tobacco Use  Smoking Status Every Day   Packs/day: 3.00   Years: 33.00   Pack years: 99.00   Types: Cigarettes  Smokeless Tobacco Never  Tobacco Comments   2PPD 10/10/2020   BP Readings from Last 3 Encounters:  10/10/20 120/66  09/11/20 107/68  08/30/20 124/64   Pulse Readings from Last 3 Encounters:  10/10/20 89  09/11/20 72  08/30/20 76   Wt Readings from Last 3 Encounters:  10/10/20 131 lb (59.4 kg)  08/30/20 130 lb 9.6 oz (59.2 kg)  07/26/20 133 lb (60.3 kg)    Assessment: Review of patient past medical history, allergies, medications, health status, including review of consultants reports, laboratory and other test data, was performed as part of comprehensive evaluation and provision of chronic care management services.   SDOH:  (Social Determinants of Health) assessments and interventions performed: none   CCM Care Plan  Allergies  Allergen Reactions   Penicillins Anaphylaxis    Has patient had a PCN reaction causing immediate rash, facial/tongue/throat swelling, SOB or lightheadedness with hypotension: Yes Has patient had a PCN reaction causing severe rash involving mucus membranes or skin necrosis: No Has patient had a PCN reaction that required hospitalization: No Has patient had a PCN reaction occurring within the last 10 years: No If all of the above answers are "NO", then may proceed with Cephalosporin use.   Throat swells   Victoza [Liraglutide]     Severe nausea   Vicodin [Hydrocodone-Acetaminophen] Rash    And hives    Medications Reviewed Today     Reviewed by Flora Lipps, MD (Physician)  on 10/10/20 at 1503  Med List Status: <None>   Medication Order Taking? Sig Documenting Provider Last Dose Status Informant  albuterol (VENTOLIN HFA) 108 (90 Base) MCG/ACT inhaler [id number] Yes Inhale 1-2 puffs into the lungs every 6 (six) hours as needed for wheezing or shortness of breath. Olin Hauser, DO Taking Active    Aspirin-Salicylamide-Caffeine [facility] HEADACHE POWDER PO) [id number] Yes Take 4 packets by mouth daily as needed (pain). [provider] Taking Active   atorvastatin (LIPITOR) 20 MG tablet [id number] Yes Take 1 tablet (20 mg total) by mouth daily at 6 PM. [name], NP Taking Active   Blood Glucose Monitoring Suppl [facility]) w/Device KIT [id number] Yes 1 Device by Does not apply route daily. [name], NP Taking Active   buPROPion (WELLBUTRIN XL) 150 MG 24 hr tablet [id number] Yes Take 150 mg by mouth daily. [provider] Taking Active   clonazePAM (KLONOPIN) 0.5 MG tablet [id number] Yes Take 0.5 mg by mouth 2 (two) times daily as needed for anxiety (Sometimes takes 3 tablets a day).  [provider] Taking Active Self           Med Note [name], [place]   Thu Sep 05, 2016  9:54 AM)    DULoxetine (CYMBALTA) 60 MG capsule [id number] Yes Take 60 mg by mouth daily. [provider] Taking Active   fluticasone (FLONASE) 50 MCG/ACT nasal spray [id number] Yes SPRAY 2 SPRAYS INTO EACH NOSTRIL EVERY DAY [name], [name], NP Taking Active   Fluticasone-Umeclidin-Vilant (TRELEGY ELLIPTA) 100-62.5-25 MCG/INH AEPB [id number] Yes Inhale 1 puff into the lungs daily. [name], FNP Taking Active   furosemide (LASIX) 40 MG tablet [id number] Yes TAKE 1 TABLET BY MOUTH EVERY DAY [name], [name], NP Taking Active   gabapentin (NEURONTIN) 300 MG capsule [id number] Yes Take by mouth 3 (three) times daily as needed. [provider] Taking Active   glucose blood test strip [id number] Yes Use as instructed [name], NP Taking Active   hydrOXYzine (VISTARIL) 25 MG capsule [id number]  Take 25 mg by mouth 2 (two) times daily. [provider]  Active   JARDIANCE 25 MG TABS tablet [id number] Yes Take 25 mg by mouth daily. [provider] Taking Active   Lancets Glory Rosebush ULTRASOFT) lancets [id number] Yes Use as instructed [name], NP Taking  Active   medroxyPROGESTERone (PROVERA) 10 MG tablet [id number] Yes Take 1 tablet (10 mg total) by mouth daily. [name], NP Taking Active   metFORMIN (GLUCOPHAGE) 1000 MG tablet [id number] Yes TAKE ONE TABLET BY MOUTH 2 TIMES A DAY WITH A MEAL Malfi, [name], FNP Taking Active   omega-3 acid ethyl esters (LOVAZA) 1 g capsule [id number] Yes TAKE 1 CAPSULE (1 G TOTAL) BY MOUTH 2 (TWO) TIMES DAILY. [name], FNP Taking Active   pantoprazole (PROTONIX) 40 MG tablet [id number] Yes TAKE 1 TABLET BY MOUTH EVERY DAY Malfi, [name], FNP Taking Active   TRULICITY 4.5 YK/9.9IP SOPN [id number] Yes Inject 0.5 mLs (4.5 mg total) subcutaneously every 7 (seven) days [provider] Taking Active   Med List Note [name], RN 06/25/18 6734): UDS 06-25-2018            Patient Active Problem List   Diagnosis Date Noted   Snoring 08/30/2020   Neutrophilia 08/30/2020   Smoker 08/30/2020   Mixed simple and mucopurulent chronic bronchitis ([place]) 07/26/2020   Gastroesophageal reflux disease without esophagitis 07/26/2020   Hyperlipidemia associated with  type 2 diabetes mellitus ([place]) 05/24/2020   Anxiety and depression 07/23/2018   Chronic pain syndrome 07/23/2018   Neuropathy due to type 2 diabetes mellitus ([place]) - severe 12/19/2015   Type 2 diabetes mellitus with other specified complication ([place]) 96/28/3662    Immunization History  Administered Date(s) Administered   Influenza Inj Mdck Quad Pf 02/07/2016   Influenza,inj,Quad PF,6+ Mos 01/04/2019   Influenza-Unspecified 02/18/2017   PFIZER Comirnaty([place])Covid-19 Tri-Sucrose Vaccine 11/10/2019    Conditions to be addressed/monitored: T2DM, HLD, COPD  Care Plan : [place] (Adult)  Updates made by Vella Raring, RPH-CPP since 10/13/2020 12:00 AM     Problem: Disease Progression      Long-Range Goal: Disease Progression Prevented or Minimized   Start Date: 03/06/2020  Expected End Date: 06/04/2020   This Visit's Progress: On track  Recent Progress: On track  Priority: High  Note:   Current Barriers:  Unable to self administer medications as prescribed Lack of blood sugar readings for clinical team  Pharmacist Clinical Goal(s):  Over the next 90 days, patient will achieve adherence to monitoring guidelines and medication adherence to achieve therapeutic efficacy. through collaboration with PharmD and provider.   Interventions: Inter-disciplinary care team collaboration (see longitudinal plan of care) 1:1 collaboration with [name], NP regarding development and update of comprehensive plan of care as evidenced by provider attestation and co-signature Perform chart review Patient seen for Office Visit with Pulmonologist on 7/12. Provider advised patient: Continue Trelegy and albuterol inhalers Obtain home sleep test already ordered Obtain 6-minute walk test to assess for exertional hypoxia Obtain overnight pulse oximetry to assess for nocturnal hypoxia Follow up with lung cancer screening program Office Visit with [facility] on 6/13 for neutrophilia [facility] on 6/9. Provider advised patient to: Continue Metformin 1000 mg twice daily Continue Trulicity 4.5 mg weekly Continue Jardiance 25 mg daily Check blood sugars fasting each morning and before bed  Bring meter to next clinic visit Continue gabapentin 600 mg in the morning and at bedtime plus 300 mg capsule (with the 600 mg capsule) at night - total 900 mg at night Office Visit with [place] on 6/1. Provider Placed order for home sleep test Placed order for PFT Re-referred patient to hematology for evaluation of elevated WBC levels Advised patient to return for formal pulmonary consult Received coordination of care message from CCM Nurse Case Manager on 7/11 that she has recently moved and has misplaced her glucometer and is interested in  referral to a new endocrinologist. Today patient reports she does not wish to change Endocrinologist, planning to continue to follow up with [facility] Unable to complete medication review today as patient not home  Type 2 Diabetes: Improved control based on latest A1C result from Endocrinologist; current treatment:  Metformin 1000 mg twice daily Trulicity 4.5 mg weekly Jardiance 25 mg daily Reports she has been unable to locate glucometer or obtain new one from her pharmacy Collaborate with [facility] Endocrinology and request Rxs for One Touch Verio meter and supplies be sent to [place] for patient today - Rxs sent Follow up with [facility] and confirm Rx for glucometer and supplies received, filled for patient and pharmacy will call patient to pick up Follow up with patient to let her know. Encourage patient to pick up and restart blood sugar monitoring Denies s/s of hypoglycemia Have counseled on importance of well balanced meals and limiting carbohydrate portion size Have counseled on  role of exercise for controlling blood sugar  COPD: Current treatment: Trelegy - 1 puff daily Albuterol - 1-2 puffs every 6 hours as needed Reports she is currently out of her Trelegy inhaler and having trouble obtaining from Pharmacy Follow up with [facility]- speak with Levada Dy who refills Trelegy Rx for patient today (no copayment) Advise patient to pick up and restart using Trelegy inhaler daily as directed, rinsing mouth after each use and using rescue (albuterol) inhaler as needed as directed Denies having received call to setup sleep test. States will follow up with Pulmonologist office today regarding this as well as contact information for lung cancer screening program and other testing.   Medication Adherence: Reports using weekly pillbox as adherence tool Confirms using weekly reminder alarm for patient's Trulicity dose on Tuesdays     Patient  Goals/Self-Care Activities Over the next 90 days, patient will:  Focus on medication adherence by using weekly pillbox as adherence tool Check blood glucose in morning and at bedtime and bring meter to future appointments with Endocrinologist Attend medical appointments as scheduled Next appointment with Endocrinology on 9/14  Follow Up Plan: Telephone follow up appointment with care management team member scheduled for: 8/16 at 10:30 am     Patient's preferred pharmacy is:  [id number] [place] [id number]: [phone]: [phone] Uses pill box? Yes  Follow Up:  Patient agrees to Care Plan and Follow-up.  EHarlow Asa PharmD, BPara March CPP Clinical Pharmacist [phone]

## 2020-10-24 ENCOUNTER — Encounter: Payer: Self-pay | Admitting: Internal Medicine

## 2020-10-24 DIAGNOSIS — J449 Chronic obstructive pulmonary disease, unspecified: Secondary | ICD-10-CM | POA: Diagnosis not present

## 2020-10-24 DIAGNOSIS — R400 Somnolence: Secondary | ICD-10-CM | POA: Diagnosis not present

## 2020-10-25 ENCOUNTER — Ambulatory Visit: Payer: Medicare HMO

## 2020-10-25 ENCOUNTER — Other Ambulatory Visit: Payer: Self-pay

## 2020-10-25 DIAGNOSIS — R0683 Snoring: Secondary | ICD-10-CM

## 2020-11-06 DIAGNOSIS — R69 Illness, unspecified: Secondary | ICD-10-CM | POA: Diagnosis not present

## 2020-11-06 DIAGNOSIS — F4010 Social phobia, unspecified: Secondary | ICD-10-CM | POA: Diagnosis not present

## 2020-11-06 DIAGNOSIS — F410 Panic disorder [episodic paroxysmal anxiety] without agoraphobia: Secondary | ICD-10-CM | POA: Diagnosis not present

## 2020-11-06 DIAGNOSIS — F331 Major depressive disorder, recurrent, moderate: Secondary | ICD-10-CM | POA: Diagnosis not present

## 2020-11-07 ENCOUNTER — Other Ambulatory Visit: Payer: Self-pay

## 2020-11-07 DIAGNOSIS — G43909 Migraine, unspecified, not intractable, without status migrainosus: Secondary | ICD-10-CM | POA: Diagnosis not present

## 2020-11-07 DIAGNOSIS — E1142 Type 2 diabetes mellitus with diabetic polyneuropathy: Secondary | ICD-10-CM | POA: Diagnosis not present

## 2020-11-07 DIAGNOSIS — G4733 Obstructive sleep apnea (adult) (pediatric): Secondary | ICD-10-CM | POA: Diagnosis not present

## 2020-11-07 DIAGNOSIS — E1165 Type 2 diabetes mellitus with hyperglycemia: Secondary | ICD-10-CM | POA: Diagnosis not present

## 2020-11-07 DIAGNOSIS — E785 Hyperlipidemia, unspecified: Secondary | ICD-10-CM | POA: Diagnosis not present

## 2020-11-07 DIAGNOSIS — R69 Illness, unspecified: Secondary | ICD-10-CM | POA: Diagnosis not present

## 2020-11-07 DIAGNOSIS — J449 Chronic obstructive pulmonary disease, unspecified: Secondary | ICD-10-CM | POA: Diagnosis not present

## 2020-11-07 DIAGNOSIS — G8929 Other chronic pain: Secondary | ICD-10-CM | POA: Diagnosis not present

## 2020-11-10 ENCOUNTER — Other Ambulatory Visit: Payer: Self-pay

## 2020-11-10 ENCOUNTER — Telehealth: Payer: Self-pay | Admitting: Pharmacist

## 2020-11-10 ENCOUNTER — Ambulatory Visit (INDEPENDENT_AMBULATORY_CARE_PROVIDER_SITE_OTHER): Payer: Medicare HMO | Admitting: Pharmacist

## 2020-11-10 DIAGNOSIS — K219 Gastro-esophageal reflux disease without esophagitis: Secondary | ICD-10-CM

## 2020-11-10 DIAGNOSIS — E785 Hyperlipidemia, unspecified: Secondary | ICD-10-CM

## 2020-11-10 DIAGNOSIS — J449 Chronic obstructive pulmonary disease, unspecified: Secondary | ICD-10-CM

## 2020-11-10 DIAGNOSIS — E118 Type 2 diabetes mellitus with unspecified complications: Secondary | ICD-10-CM

## 2020-11-10 DIAGNOSIS — E1169 Type 2 diabetes mellitus with other specified complication: Secondary | ICD-10-CM

## 2020-11-10 MED ORDER — PANTOPRAZOLE SODIUM 40 MG PO TBEC: 40.0000 mg | DELAYED_RELEASE_TABLET | Freq: Every day | ORAL | 1 refills | Status: DC

## 2020-11-10 MED ORDER — OMEGA-3-ACID ETHYL ESTERS 1 G PO CAPS: 1.0000 g | ORAL_CAPSULE | Freq: Two times a day (BID) | ORAL | 3 refills | Status: DC

## 2020-11-10 MED ORDER — ATORVASTATIN CALCIUM 20 MG PO TABS: 20.0000 mg | ORAL_TABLET | Freq: Every day | ORAL | 0 refills | Status: DC

## 2020-11-10 NOTE — Chronic Care Management (AMB) (Signed)
Chronic Care Management Pharmacy Note  11/10/2020 Name:  Brandi Calderon MRN:  [id number] DOB:  11-Apr-1965   Subjective: Brandi Calderon is an 55 y.o. year old female who is a primary patient of [name], NP.  The CCM team was consulted for assistance with disease management and care coordination needs.    Engaged with patient by telephone for follow up visit in response to provider referral for pharmacy case management and/or care coordination services.   Consent to Services:  The patient was given information about Chronic Care Management services, agreed to services, and gave verbal consent prior to initiation of services.  Please see initial visit note for detailed documentation.   Patient Care Team: [name], NP as PCP - General (Internal Medicine) Dr. Leonides Schanz (Psychiatry) Dr. Kenton Kingfisher (Gynecology) Tawni Millers, MD (Internal Medicine) Vanita Ingles, RN as Registered Nurse ([place]) Curley Spice, Virl Diamond, [employer] as Pharmacist (Pharmacist) Rebekah Chesterfield, LCSW as Social Worker (Licensed Clinical Social Worker)  Recent office visits: None  Hospital visits: None in previous 6 months  Objective:  Lab Results  Component Value Date   CREATININE 0.83 09/11/2020   CREATININE 0.81 09/07/2019   CREATININE 0.74 01/04/2019    Lab Results  Component Value Date   HGBA1C 7.7 (A) 09/07/2019   HGBA1C 7.7 09/07/2019   HGBA1C 7.7 (A) 09/07/2019  A1C value: 7.6% on 09/07/2020 per shared record with [place]  Last diabetic Eye exam:  Lab Results  Component Value Date/Time   HMDIABEYEEXA No Retinopathy 02/21/2020 12:00 AM    Last diabetic Foot exam: No results found for: HMDIABFOOTEX      Component Value Date/Time   CHOL 205 (H) 01/04/2019 0929   CHOL 120 12/24/2016 1827   TRIG 87 01/04/2019 0929   HDL 40 (L) 01/04/2019 0929   HDL 29 (L) 12/24/2016 1827   CHOLHDL 5.1 (H) 01/04/2019 0929   LDLCALC 146 (H) 01/04/2019 0929     Hepatic Function Latest Ref Rng & Units 09/11/2020 09/07/2019 01/04/2019  Total Protein 6.5 - 8.1 g/dL 7.3 7.2 7.1  Albumin 3.5 - 5.0 g/dL 4.3 - -  AST 15 - 41 U/L 13(L) 10 11  ALT 0 - 44 U/L 11 9 8   Alk Phosphatase 38 - 126 U/L 87 - -  Total Bilirubin 0.3 - 1.2 mg/dL 0.3 0.4 0.3    Social History   Tobacco Use  Smoking Status Every Day   Packs/day: 3.00   Years: 33.00   Pack years: 99.00   Types: Cigarettes  Smokeless Tobacco Never  Tobacco Comments   2PPD 10/10/2020   BP Readings from Last 3 Encounters:  10/10/20 120/66  09/11/20 107/68  08/30/20 124/64   Pulse Readings from Last 3 Encounters:  10/10/20 89  09/11/20 72  08/30/20 76   Wt Readings from Last 3 Encounters:  10/10/20 131 lb (59.4 kg)  08/30/20 130 lb 9.6 oz (59.2 kg)  07/26/20 133 lb (60.3 kg)    Assessment: Review of patient past medical history, allergies, medications, health status, including review of consultants reports, laboratory and other test data, was performed as part of comprehensive evaluation and provision of chronic care management services.   SDOH:  (Social Determinants of Health) assessments and interventions performed: none   CCM Care Plan  Allergies  Allergen Reactions   Penicillins Anaphylaxis    Has patient had a PCN reaction causing immediate rash, facial/tongue/throat swelling, SOB or lightheadedness with hypotension: Yes Has patient  had a PCN reaction causing severe rash involving mucus membranes or skin necrosis: No Has patient had a PCN reaction that required hospitalization: No Has patient had a PCN reaction occurring within the last 10 years: No If all of the above answers are "NO", then may proceed with Cephalosporin use.   Throat swells   Victoza [Liraglutide]     Severe nausea   Vicodin [Hydrocodone-Acetaminophen] Rash    And hives    Medications Reviewed Today     Reviewed by [name], RPH-CPP (Pharmacist) on 11/10/20 at 0927  Med List Status:  <None>   Medication Order Taking? Sig Documenting Provider Last Dose Status Informant  albuterol (VENTOLIN HFA) 108 (90 Base) MCG/ACT inhaler [id number] Yes Inhale 1-2 puffs into the lungs every 6 (six) hours as needed for wheezing or shortness of breath. Olin Hauser, DO Taking Active   Aspirin-Salicylamide-Caffeine [facility] HEADACHE POWDER PO) [id number]  Take 4 packets by mouth daily as needed (pain). [provider]  Active   atorvastatin (LIPITOR) 20 MG tablet [id number]  Take 1 tablet (20 mg total) by mouth daily at 6 PM. [name], NP  Active   Blood Glucose Monitoring Suppl [facility]) w/Device KIT [id number]  1 Device by Does not apply route daily. [name], NP  Active   buPROPion (WELLBUTRIN XL) 150 MG 24 hr tablet [id number]  Take 150 mg by mouth daily. [provider]  Active   clonazePAM (KLONOPIN) 0.5 MG tablet [id number]  Take 0.5 mg by mouth 2 (two) times daily as needed for anxiety (Sometimes takes 3 tablets a day).  [provider]  Active Self           Med Note [name], [place]   Thu Sep 05, 2016  9:54 AM)    DULoxetine (CYMBALTA) 60 MG capsule [id number]  Take 60 mg by mouth daily. [provider]  Active   fluticasone (FLONASE) 50 MCG/ACT nasal spray [id number]  SPRAY 2 SPRAYS INTO EACH NOSTRIL EVERY DAY [name], [name], NP  Active   Fluticasone-Umeclidin-Vilant (TRELEGY ELLIPTA) 100-62.5-25 MCG/INH AEPB [id number] Yes Inhale 1 puff into the lungs daily. [name], FNP Taking Active   furosemide (LASIX) 40 MG tablet [id number]  TAKE 1 TABLET BY MOUTH EVERY DAY [name], [name], NP  Active   gabapentin (NEURONTIN) 300 MG capsule [id number]  Take by mouth 3 (three) times daily as needed. [provider]  Active   glucose blood test strip [id number]  Use as instructed [name], NP  Active   hydrOXYzine (VISTARIL) 25 MG capsule [id number]  Take 25 mg by mouth 2 (two) times daily. [provider]  Active    JARDIANCE 25 MG TABS tablet [id number] Yes Take 25 mg by mouth daily. [provider] Taking Active   Lancets Glory Rosebush ULTRASOFT) lancets [id number]  Use as instructed [name], NP  Active   medroxyPROGESTERone (PROVERA) 10 MG tablet [id number]  Take 1 tablet (10 mg total) by mouth daily. [name], NP  Active   metFORMIN (GLUCOPHAGE) 1000 MG tablet [id number] Yes TAKE ONE TABLET BY MOUTH 2 TIMES A DAY WITH A MEAL Malfi, [name], FNP Taking Active   omega-3 acid ethyl esters (LOVAZA) 1 g capsule [id number]  TAKE 1 CAPSULE (1 G TOTAL) BY MOUTH 2 (TWO) TIMES DAILY. [name], FNP  Active   pantoprazole (PROTONIX) 40 MG tablet [id number]  TAKE 1 TABLET BY MOUTH EVERY DAY Malfi, [name], FNP  Active  TRULICITY 4.5 AS/5.0NL SOPN [id number] Yes Inject 0.5 mLs (4.5 mg total) subcutaneously every 7 (seven) days [provider] Taking Active   Med List Note [name], RN 06/25/18 7902): UDS 06-25-2018            Patient Active Problem List   Diagnosis Date Noted   Snoring 08/30/2020   Neutrophilia 08/30/2020   Smoker 08/30/2020   Mixed simple and mucopurulent chronic bronchitis ([place]) 07/26/2020   Gastroesophageal reflux disease without esophagitis 07/26/2020   Hyperlipidemia associated with type 2 diabetes mellitus ([place]) 05/24/2020   Anxiety and depression 07/23/2018   Chronic pain syndrome 07/23/2018   Neuropathy due to type 2 diabetes mellitus ([place]) - severe 12/19/2015   Type 2 diabetes mellitus with other specified complication ([place]) 40/97/3532    Immunization History  Administered Date(s) Administered   Influenza Inj Mdck Quad Pf 02/07/2016   Influenza,inj,Quad PF,6+ Mos 01/04/2019   Influenza-Unspecified 02/18/2017   PFIZER Comirnaty([place])Covid-19 Tri-Sucrose Vaccine 11/10/2019    Conditions to be addressed/monitored: T2DM, HLD, COPD  Care Plan : General Pharmacy (Adult)  Updates made by [name], RPH-CPP since  11/10/2020 12:00 AM     Problem: Disease Progression      Long-Range Goal: Disease Progression Prevented or Minimized   Start Date: 03/06/2020  Expected End Date: 06/04/2020  This Visit's Progress: On track  Recent Progress: On track  Priority: High  Note:   Current Barriers:  Unable to self administer medications as prescribed Lack of blood sugar readings for clinical team  Pharmacist Clinical Goal(s):  Over the next 90 days, patient will achieve adherence to monitoring guidelines and medication adherence to achieve therapeutic efficacy. through collaboration with PharmD and provider.   Interventions: Inter-disciplinary care team collaboration (see longitudinal plan of care) 1:1 collaboration with [name], NP regarding development and update of comprehensive plan of care as evidenced by provider attestation and co-signature Comprehensive medication review performed; medication list updated in electronic medical record Note patient followed by [place] provider for mental health medications Reports no longer taking hydroxyzine (update local medicaiton record accordingly)  Type 2 Diabetes: Improved control based on latest A1C result from Endocrinologist; current treatment:  Metformin 1000 mg twice daily Trulicity 4.5 mg weekly on Tuesdays Jardiance 25 mg daily Confirms restarted monitoring blood sugar with new One Touch Verio meter Reports recent blood sugar readings ranging: morning: 140-160; bedtime: 140s Counsel patient to check morning blood sugar prior to coffee sweetened with creamer Denies s/s of hypoglycemia Reports doing well with having well balanced meals and limiting carbohydrate portion size Exercise: reports has been using treadmill at the gym, currently trying to go every other day  COPD: Current treatment: Trelegy - 1 puff daily Albuterol - 1-2 puffs every 6 hours as needed Reports using Trelegy inhaler daily as directed, rinsing mouth after each use and using  rescue (albuterol) inhaler as needed as directed Reports completed both overnight pulse oximetry and home sleep tests, but has not yet done 6-minute walk test as recommended by Pulmonologist on 7/12 States will follow up with Pulmonologist office today regarding this as well as contact information for lung cancer screening program and other testing.   Medication Adherence: Identify patient out of atorvastatin, omega-3 and pantoprazole and prescriptions out of refills Will collaborate with clinical team for renewal of [place] patient on importance of medication adherence and on process for navigating Rx refills to ensure have medication prior to running out Reports currently taking medication from pill bottles as has  misplaced weekly pillbox Encourage patient to restart using weekly pillbox or consider using pill packaging to aid with medication adherence Patient reports interested in switching presriptions to [place] for pill packaging, as pharmacy is close to her home. States that she will go by pharmacy to discuss getting setup for pill packaging and let our office/CM Pharmacist know if we can help with pill packaging setup Has reported using weekly reminder alarm for patient's Trulicity dose on Tuesdays     Patient Goals/Self-Care Activities Over the next 90 days, patient will:  Focus on medication adherence by using weekly pillbox as adherence tool Check blood glucose in morning and at bedtime and bring meter to future appointments with Endocrinologist Attend medical appointments as scheduled  Follow Up Plan: Telephone follow up appointment with care management team member scheduled for: 9/7 at 10 am      Patient's preferred pharmacy is:  [id number] [place] [address]Phone: [phone]: [phone]  Follow Up:  Patient agrees to Care Plan and Follow-up.  EWallace Cullens PharmD, BPara [place]  CPP Clinical Pharmacist [phone]

## 2020-11-10 NOTE — Telephone Encounter (Signed)
Patient requesting renewal of atorvastatin, omega-3 and pantoprazole Rxs. [facility]?  Thank you!  Brandi Calderon

## 2020-11-10 NOTE — Patient Instructions (Signed)
Visit Information  PATIENT GOALS:  Goals Addressed             This Visit's Progress    Pharmacy Goals       Our goal A1c is less than 7%. This corresponds with fasting sugars less than 130 and 2 hour after meal sugars less than 180. Please check your blood sugar each morning and again before bed 2-3 nights per week.  Please bring blood sugar log and/or meter to every visit with Endocrinologist  Our goal bad cholesterol, or LDL, is less than 70 . This is why it is important to continue taking your atorvastatin and omega-3 prescriptions.  Feel free to call me with any questions or concerns. I look forward to our next call!    Wallace Cullens, PharmD, [place] [phone]        The patient verbalized understanding of instructions, educational materials, and care plan provided today and declined offer to receive copy of patient instructions, educational materials, and care plan.   Telephone follow up appointment with care management team member scheduled for: 9/7 at 10 am

## 2020-11-13 DIAGNOSIS — G4733 Obstructive sleep apnea (adult) (pediatric): Secondary | ICD-10-CM | POA: Diagnosis not present

## 2020-11-14 ENCOUNTER — Ambulatory Visit: Payer: Medicare HMO | Admitting: Licensed Clinical Social Worker

## 2020-11-14 DIAGNOSIS — G894 Chronic pain syndrome: Secondary | ICD-10-CM

## 2020-11-14 DIAGNOSIS — E1169 Type 2 diabetes mellitus with other specified complication: Secondary | ICD-10-CM

## 2020-11-14 DIAGNOSIS — Z794 Long term (current) use of insulin: Secondary | ICD-10-CM

## 2020-11-14 DIAGNOSIS — F32A Depression, unspecified: Secondary | ICD-10-CM

## 2020-11-14 DIAGNOSIS — E1140 Type 2 diabetes mellitus with diabetic neuropathy, unspecified: Secondary | ICD-10-CM

## 2020-11-14 NOTE — Patient Instructions (Signed)
Visit Information   Goals Addressed               This Visit's Progress     Patient Stated     SW- "I'm doing a lot better now overall." (pt-stated)   On track     Patient Self Care Activities:  Continue compliance with medication management Attend all scheduled provider appointments Call provider office for new concerns or questions Utilize strategies discussed to assist in management of symptoms      Other     SW-Manage My Emotions   On track     Patient Self Care Activities:  Continue compliance with medication management Attend all scheduled provider appointments Call provider office for new concerns or questions Utilize strategies discussed to assist in management of symptoms        Patient verbalizes understanding of instructions provided today and agrees to view in [place].   Telephone follow up appointment with care management team member scheduled for:02/06/21  Christa See, MSW, [email or web address] Phone [phone] 12:15 PM

## 2020-11-14 NOTE — Chronic Care Management (AMB) (Signed)
Chronic Care Management    Clinical Social Work Note  11/14/2020 Name: Brandi Calderon MRN: [id number] DOB: 1966-03-16  Brandi Calderon is a 55 y.o. year old female who is a primary care patient of [name], NP. The CCM team was consulted to assist the patient with chronic disease management and/or care coordination needs related to: Mental Health Counseling and Resources and Caregiver Stress.   Engaged with patient by telephone for follow up visit in response to provider referral for social work chronic care management and care coordination services.   Consent to Services:  The patient was given information about Chronic Care Management services, agreed to services, and gave verbal consent prior to initiation of services.  Please see initial visit note for detailed documentation.   Patient agreed to services and consent obtained.   Consent to Services:  The patient was given information about Care Management services, agreed to services, and gave verbal consent prior to initiation of services.  Please see initial visit note for detailed documentation.   Patient agreed to services today and consent obtained.   Assessment: Engaged with patient by phone in response to provider referral for social work care coordination services: [place] and Resources.    Patient continues to maintain positive progress with care plan goals. She continues to participate in med management through [place] and is initiating counseling to assist with management of depression and anxiety symptoms. See Care Plan below for interventions and patient self-care actives.  Recent life changes or stressors: Management of health conditions  Recommendation: Patient may benefit from, and is in agreement work with LCSW to address care coordination needs and will continue to work with the clinical team to address health care and disease management related needs.   Follow up Plan: Patient would like  continued follow-up from CCM LCSW .  per patient's request will follow up in 02/06/21. Patient will call office if needed prior to next encounter.     SDOH (Social Determinants of Health) assessments and interventions performed:    Advanced Directives Status: Not addressed in this encounter.  CCM Care Plan  Allergies  Allergen Reactions   Penicillins Anaphylaxis    Has patient had a PCN reaction causing immediate rash, facial/tongue/throat swelling, SOB or lightheadedness with hypotension: Yes Has patient had a PCN reaction causing severe rash involving mucus membranes or skin necrosis: No Has patient had a PCN reaction that required hospitalization: No Has patient had a PCN reaction occurring within the last 10 years: No If all of the above answers are "NO", then may proceed with Cephalosporin use.   Throat swells   Victoza [Liraglutide]     Severe nausea   Vicodin [Hydrocodone-Acetaminophen] Rash    And hives    Outpatient Encounter Medications as of 11/14/2020  Medication Sig   albuterol (VENTOLIN HFA) 108 (90 Base) MCG/ACT inhaler Inhale 1-2 puffs into the lungs every 6 (six) hours as needed for wheezing or shortness of breath.   Aspirin-Salicylamide-Caffeine (BC HEADACHE POWDER PO) Take 4 packets by mouth daily as needed (pain). (Patient not taking: Reported on 11/10/2020)   atorvastatin (LIPITOR) 20 MG tablet Take 1 tablet (20 mg total) by mouth daily at 6 PM.   Blood Glucose Monitoring Suppl (ONETOUCH ULTRALINK) w/Device KIT 1 Device by Does not apply route daily.   buPROPion (WELLBUTRIN XL) 150 MG 24 hr tablet Take 150 mg by mouth daily.   clonazePAM (KLONOPIN) 0.5 MG tablet Take 0.5 mg by mouth 2 (two) times  daily as needed for anxiety (Sometimes takes 3 tablets a day).    DULoxetine (CYMBALTA) 60 MG capsule Take 60 mg by mouth daily.   fluticasone (FLONASE) 50 MCG/ACT nasal spray SPRAY 2 SPRAYS INTO EACH NOSTRIL EVERY DAY   Fluticasone-Umeclidin-Vilant (TRELEGY ELLIPTA)  100-62.5-25 MCG/INH AEPB Inhale 1 puff into the lungs daily.   furosemide (LASIX) 40 MG tablet TAKE 1 TABLET BY MOUTH EVERY DAY   gabapentin (NEURONTIN) 300 MG capsule Take by mouth 3 (three) times daily as needed.   glucose blood test strip Use as instructed   JARDIANCE 25 MG TABS tablet Take 25 mg by mouth daily.   Lancets (ONETOUCH ULTRASOFT) lancets Use as instructed   medroxyPROGESTERone (PROVERA) 10 MG tablet Take 1 tablet (10 mg total) by mouth daily.   metFORMIN (GLUCOPHAGE) 1000 MG tablet TAKE ONE TABLET BY MOUTH 2 TIMES A DAY WITH A MEAL   omega-3 acid ethyl esters (LOVAZA) 1 g capsule Take 1 capsule (1 g total) by mouth 2 (two) times daily.   pantoprazole (PROTONIX) 40 MG tablet Take 1 tablet (40 mg total) by mouth daily.   TRULICITY 4.5 WU/9.8JX SOPN Inject 0.5 mLs (4.5 mg total) subcutaneously every 7 (seven) days   No facility-administered encounter medications on file as of 11/14/2020.    Patient Active Problem List   Diagnosis Date Noted   Snoring 08/30/2020   Neutrophilia 08/30/2020   Smoker 08/30/2020   Mixed simple and mucopurulent chronic bronchitis ([place]) 07/26/2020   Gastroesophageal reflux disease without esophagitis 07/26/2020   Hyperlipidemia associated with type 2 diabetes mellitus ([place]) 05/24/2020   Anxiety and depression 07/23/2018   Chronic pain syndrome 07/23/2018   Neuropathy due to type 2 diabetes mellitus ([place]) - severe 12/19/2015   Type 2 diabetes mellitus with other specified complication ([place]) 91/47/8295    Conditions to be addressed/monitored: Anxiety and Depression; Mental Health Concerns   Care Plan : General Social Work (Adult)  Updates made by Christa See D, LCSW since 11/14/2020 12:00 AM     Problem: Response to Treatment (Depression)      Long-Range Goal: Response to Treatment Maximized   Start Date: 05/23/2020  This Visit's Progress: On track  Recent Progress: On track  Priority: Medium  Note:   Timeframe:  Long-Range  Goal Priority:  Medium  Start Date:  05/23/20                         Expected End Date:  02/28/21                    Follow Up Date- 02/06/21  Current Barriers:  Chronic Mental Health needs related to Depression, Anxiety and Grief Limited social support Mental Health Concerns  Social Isolation Loss of child in 2016 Suicidal Ideation/Homicidal Ideation: No Clinical Social Work Goal(s):  Over the next 120 days, patient will work with SW to address concerns related to care coordination needs and lack of education/support/resource connection. LCSW will assist patient in gaining community resource education and additional support and resource connection as well in order to maintain health and mental health appropriately  Over the next 120 days, patient will demonstrate improved adherence to self care as evidenced by implementing healthy self-care into her daily routine such as: attending all medical appointments, deep breathing exercises, taking time for self-reflection, taking medications as prescribed, drinking water and daily exercise to improve mobility and mood.  Over the next 120 days, patient will work with SW bi-monthly  by telephone or in person to reduce or manage symptoms related to stress, anxiety and grief. Over the next 120 days, patient will demonstrate improved health management independence as evidenced by implementing healthy self-care skills and positive support/resources into her daily routine to help cope with stressors and improve overall health and well-being  Over the next 120 days, patient or caregiver will verbalize basic understanding of depression/stress process and self health management plan as evidenced by her participation in development of long term plan of care and institution of self health management strategies Interventions: Patient interviewed and appropriate assessments performed: brief mental health assessment Patient reports symptoms of depression and anxiety  are managed well currently. Triggers include chronic medical conditions and declining health of father (just initiated hospice) Patient continues participate in medication management with psychiatrist, Dr. List, through [facility] every two months. Patient states medications play a large role in management of symptoms. She was recently referred to therapy by Dr. List within [facility] and is expecting a call to schedule initial appointment Per patient, her lungs work at 50% capacity. She plans to schedule lung screening in the near future. Patient utilizes two inhalers prescribed by specialist to assist with SOB. Patient shared this occurs when she over exerts self  Patient reports that use of Gabapentin is effective in pain management. She reports [facility] CCM LCSW provided validation and encouragement. LCSW strongly encouraged patient to contact provider [facility]. Patient agreed to do so and has the provider's contact information Strategies to promote self-care and stress management to assist in management of physical and mental health conditions discussed. Emotional support was provided Patient reports strong support from two adult daughters who reside locally. Daughter will accompany patient to medical appointments Solution-Focused Strategies, Mindfulness or Relaxation Training, Active listening / Reflection utilized , Reviewed mental health medications with patient and discussed compliance: , and Verbalization of feelings encouraged  Discussed plans with patient for ongoing care management follow up and provided patient with direct contact information for care management team Advised patient to contact CCM LCSW for any urgent social work related issues 1:1 collaboration with primary care provider regarding development and update of comprehensive plan of care as evidenced by provider attestation and co-signature Patient Self Care  Activities:  Continue compliance with medication management Attend all scheduled provider appointments Call provider office for new concerns or questions Utilize strategies discussed to assist in management of symptoms        Christa See, MSW, [email or web address] Phone [phone] 12:13 PM

## 2020-11-21 ENCOUNTER — Telehealth: Payer: Self-pay

## 2020-11-21 DIAGNOSIS — J449 Chronic obstructive pulmonary disease, unspecified: Secondary | ICD-10-CM

## 2020-11-21 NOTE — Telephone Encounter (Signed)
Fyi: Called and spoke to patient in regards to ONO results, Per Dr Mortimer Fries patient qualifies for 1L of O2 at night, patient was in agreement with this information.   Will place order for 1L at night.

## 2020-11-22 DIAGNOSIS — J449 Chronic obstructive pulmonary disease, unspecified: Secondary | ICD-10-CM | POA: Diagnosis not present

## 2020-11-27 ENCOUNTER — Ambulatory Visit: Payer: Self-pay

## 2020-11-27 ENCOUNTER — Telehealth: Payer: Medicare HMO | Admitting: General Practice

## 2020-11-27 DIAGNOSIS — E1140 Type 2 diabetes mellitus with diabetic neuropathy, unspecified: Secondary | ICD-10-CM

## 2020-11-27 DIAGNOSIS — E1169 Type 2 diabetes mellitus with other specified complication: Secondary | ICD-10-CM

## 2020-11-27 DIAGNOSIS — F32A Depression, unspecified: Secondary | ICD-10-CM

## 2020-11-27 DIAGNOSIS — J449 Chronic obstructive pulmonary disease, unspecified: Secondary | ICD-10-CM | POA: Diagnosis not present

## 2020-11-27 DIAGNOSIS — F419 Anxiety disorder, unspecified: Secondary | ICD-10-CM

## 2020-11-27 DIAGNOSIS — Z794 Long term (current) use of insulin: Secondary | ICD-10-CM | POA: Diagnosis not present

## 2020-11-27 DIAGNOSIS — E785 Hyperlipidemia, unspecified: Secondary | ICD-10-CM | POA: Diagnosis not present

## 2020-11-27 DIAGNOSIS — R69 Illness, unspecified: Secondary | ICD-10-CM | POA: Diagnosis not present

## 2020-11-27 NOTE — Patient Instructions (Signed)
Visit Information  PATIENT GOALS:  Goals Addressed             This Visit's Progress    RNCM: Manage Fatigue (Tiredness-COPD)       Follow Up Date 01/15/2021    - develop a new routine to improve sleep - don't eat or exercise right before bedtime - eat healthy - get at least 7 to 8 hours of sleep at night - get outdoors every day (weather permitting) - keep room cool and dark - limit daytime naps - practice relaxation or meditation daily - use a fan or white noise in bedroom    Why is this important?   Feeling tired or worn out is a common symptom of COPD (chronic obstructive pulmonary disease).  Learning when you feel your best and when you need rest is important.  Managing the tiredness (fatigue) will help you be active and enjoy life.     Notes: The patient states since [place] she is having a hard time with her COPD and overall health.[facility] specialist on 10-10-2020. 11-27-2020: The patient states her breathing is much better. The cooler temperatures  are helping and also the patient is using oxygen at night. Denies any recent exacerbations.      RNCM: Monitor and Manage My Blood Sugar-Diabetes Type 2       Timeframe:  Long-Range Goal Priority:  High Start Date:     10-09-2020                        Expected End Date:   10-09-2021                    Follow Up Date 01/15/2021     - check blood sugar at prescribed times - check blood sugar before and after exercise - check blood sugar if I feel it is too high or too low - enter blood sugar readings and medication or insulin into daily log - take the blood sugar log to all doctor visits - take the blood sugar meter to all doctor visits    Why is this important?   Checking your blood sugar at home helps to keep it from getting very high or very low.  Writing the results in a diary or log helps the doctor know how to care for you.  Your blood sugar log should have the time, date and the results.  Also, write down the amount  of insulin or other medicine that you take.  Other information, like what you ate, exercise done and how you were feeling, will also be helpful.     Notes: 10-09-2020: The patient currently does not have a meter to check her blood sugars. Will request a meter today from pcp and ask pharmacist for assistance.  Patient also needs an endocrinologist consult. 11-27-2020: The patient is checking her blood sugars now. Usually BID. States fasting is usually around 140-160. Sometimes she checks it in the afternoon and at night. Review of fasting <130 and post prandial of <180. Education on last hemoglobin A1C of 7.6 and the goal of A1C of 7.0.     RNCM: Track and Manage My Symptoms-Depression       Timeframe:  Long-Range Goal Priority:  High Start Date:   10-09-2020                          Expected End Date:  10-09-2021            Follow Up Date 01/15/2021    - avoid negative self-talk - develop a personal safety plan - develop a plan to deal with triggers like holidays, anniversaries - exercise at least 2 to 3 times per week - have a plan for how to handle bad days - journal feelings and what helps to feel better or worse - spend time or talk with others at least 2 to 3 times per week - spend time or talk with others every day - watch for early signs of feeling worse - write in journal every day    Why is this important?   Keeping track of your progress will help your treatment team find the right mix of medicine and therapy for you.  Write in your journal every day.  Day-to-day changes in depression symptoms are normal. It may be more helpful to check your progress at the end of each week instead of every day.     Notes: Patient states that her and her boyfriend recently broke up and it is causing her a lot of stress and making her nerves bad. Empathetic listening and support. Also her elderly father is sick and she is concerned about him. Education provided. 11-27-2020: The patient  states she has good days and bad days. She says she goes to see her father at the nursing home several times a week and today she was there earlier feeding him his lunch. He does not know who she is but she goes and feeds him and spends time with him.      RNCM: Track and Manage My Triggers-COPD       Timeframe:  Long-Range Goal Priority:  High Start Date:   10-09-2020                          Expected End Date:          10-09-2021             Follow Up Date 01/15/2021    - avoid second hand smoke - eliminate smoking in my home - identify and avoid work-related triggers - identify and remove indoor air pollutants - limit outdoor activity during cold weather - listen for public air quality announcements every day    Why is this important?   Triggers are activities or things, like tobacco smoke or cold weather, that make your COPD (chronic obstructive pulmonary disease) flare-up.  Knowing these triggers helps you plan how to stay away from them.  When you cannot remove them, you can learn how to manage them.     Notes: The patient continues to smoke. Talked about smoking cessation. The patient had a breathing test and is seeing a specialist on 10-10-2020. States she believes she has sleep apnea. Will have a test for sleep apnea coming up. Her daughter will go with her to her appointment tomorrow. 11-27-2020: The patient is using oxygen at night. She can tell that this is helping her a lot. She is feeling more rested than she was. The patient denies any new concerns with her COPD.        Patient verbalizes understanding of instructions provided today and agrees to view in [place].   Telephone follow up appointment with care management team member scheduled for: 01-15-2021 at 145 pm  [place], MSN, [facility]  Mobile: [phone]

## 2020-11-27 NOTE — Chronic Care Management (AMB) (Signed)
Chronic Care Management   CCM RN Visit Note  11/27/2020 Name: Brandi Calderon MRN: [id number] DOB: [birth year]  Subjective: Brandi Calderon is a 55 y.o. year old female who is a primary care patient of [name], NP. The care management team was consulted for assistance with disease management and care coordination needs.    Engaged with patient by telephone for follow up visit in response to provider referral for case management and/or care coordination services.   Consent to Services:  The patient was given information about Chronic Care Management services, agreed to services, and gave verbal consent prior to initiation of services.  Please see initial visit note for detailed documentation.   Patient agreed to services and verbal consent obtained.   Assessment: Review of patient past medical history, allergies, medications, health status, including review of consultants reports, laboratory and other test data, was performed as part of comprehensive evaluation and provision of chronic care management services.   SDOH (Social Determinants of Health) assessments and interventions performed:    CCM Care Plan  Allergies  Allergen Reactions   Penicillins Anaphylaxis    Has patient had a PCN reaction causing immediate rash, facial/tongue/throat swelling, SOB or lightheadedness with hypotension: Yes Has patient had a PCN reaction causing severe rash involving mucus membranes or skin necrosis: No Has patient had a PCN reaction that required hospitalization: No Has patient had a PCN reaction occurring within the last 10 years: No If all of the above answers are "NO", then may proceed with Cephalosporin use.   Throat swells   Victoza [Liraglutide]     Severe nausea   Vicodin [Hydrocodone-Acetaminophen] Rash    And hives    Outpatient Encounter Medications as of 11/27/2020  Medication Sig   albuterol (VENTOLIN HFA) 108 (90 Base) MCG/ACT inhaler Inhale 1-2 puffs into the lungs  every 6 (six) hours as needed for wheezing or shortness of breath.   Aspirin-Salicylamide-Caffeine (BC HEADACHE POWDER PO) Take 4 packets by mouth daily as needed (pain). (Patient not taking: Reported on 11/10/2020)   atorvastatin (LIPITOR) 20 MG tablet Take 1 tablet (20 mg total) by mouth daily at 6 PM.   Blood Glucose Monitoring Suppl (ONETOUCH ULTRALINK) w/Device KIT 1 Device by Does not apply route daily.   buPROPion (WELLBUTRIN XL) 150 MG 24 hr tablet Take 150 mg by mouth daily.   clonazePAM (KLONOPIN) 0.5 MG tablet Take 0.5 mg by mouth 2 (two) times daily as needed for anxiety (Sometimes takes 3 tablets a day).    DULoxetine (CYMBALTA) 60 MG capsule Take 60 mg by mouth daily.   fluticasone (FLONASE) 50 MCG/ACT nasal spray SPRAY 2 SPRAYS INTO EACH NOSTRIL EVERY DAY   Fluticasone-Umeclidin-Vilant (TRELEGY ELLIPTA) 100-62.5-25 MCG/INH AEPB Inhale 1 puff into the lungs daily.   furosemide (LASIX) 40 MG tablet TAKE 1 TABLET BY MOUTH EVERY DAY   gabapentin (NEURONTIN) 300 MG capsule Take by mouth 3 (three) times daily as needed.   glucose blood test strip Use as instructed   JARDIANCE 25 MG TABS tablet Take 25 mg by mouth daily.   Lancets (ONETOUCH ULTRASOFT) lancets Use as instructed   medroxyPROGESTERone (PROVERA) 10 MG tablet Take 1 tablet (10 mg total) by mouth daily.   metFORMIN (GLUCOPHAGE) 1000 MG tablet TAKE ONE TABLET BY MOUTH 2 TIMES A DAY WITH A MEAL   omega-3 acid ethyl esters (LOVAZA) 1 g capsule Take 1 capsule (1 g total) by mouth 2 (two) times daily.   pantoprazole (PROTONIX) 40 MG tablet  Take 1 tablet (40 mg total) by mouth daily.   TRULICITY 4.5 VF/6.4PP SOPN Inject 0.5 mLs (4.5 mg total) subcutaneously every 7 (seven) days   No facility-administered encounter medications on file as of 11/27/2020.    Patient Active Problem List   Diagnosis Date Noted   Snoring 08/30/2020   Neutrophilia 08/30/2020   Smoker 08/30/2020   Mixed simple and mucopurulent chronic bronchitis ([place])  07/26/2020   Gastroesophageal reflux disease without esophagitis 07/26/2020   Hyperlipidemia associated with type 2 diabetes mellitus ([place]) 05/24/2020   Anxiety and depression 07/23/2018   Chronic pain syndrome 07/23/2018   Neuropathy due to type 2 diabetes mellitus ([place]) - severe 12/19/2015   Type 2 diabetes mellitus with other specified complication ([place]) 29/51/8841    Conditions to be addressed/monitored:HLD, COPD, DMII, and Depression  Care Plan : RNCM: COPD (Adult)  Updates made by Vanita Ingles, RN since 11/27/2020 12:00 AM     Problem: RNCM: Psychological Adjustment to Diagnosis (COPD)   Priority: High     Long-Range Goal: RNCM: COPD Adjustment to Disease Achieved   Start Date: 04/17/2020  Expected End Date: 08/05/2021  This Visit's Progress: On track  Recent Progress: On track  Priority: High  Note:   Current Barriers:  Knowledge deficits related to basic understanding of COPD disease process Knowledge deficits related to basic COPD self care/management Knowledge deficit related to basic understanding of how to use inhalers and how inhaled medications work Knowledge deficit related to importance of energy conservation Limited Social Support Unable to independently manage COPD Lacks social connections Unable to perform IADLs independently Does not maintain contact with provider office Does not contact provider office for questions/concerns  Case Manager Clinical Goal(s): patient will report using inhalers as prescribed including rinsing mouth after use patient will report utilizing pursed lip breathing for shortness of breath patient will verbalize understanding of COPD action plan and when to seek appropriate levels of medical care patient will engage in lite exercise as tolerated to build/regain stamina and strength and reduce shortness of breath through activity tolerance patient will verbalize basic understanding of COPD disease process and self care  activities patient will not be hospitalized for COPD exacerbation as evidenced  Interventions:  Collaboration with [name], NP regarding development and update of comprehensive plan of care as evidenced by provider attestation and co-signature Inter-disciplinary care team collaboration (see longitudinal plan of care) Provided patient with basic written and verbal COPD education on self care/management/and exacerbation prevention  Provided patient with COPD action plan and reinforced importance of daily self assessment Provided written and verbal instructions on pursed lip breathing and utilized returned demonstration as teach back Provided instruction about proper use of medications used for management of COPD including inhalers Advised patient to self assesses COPD action plan zone and make appointment with provider if in the yellow zone for 48 hours without improvement. 11-27-2020: The patient is using oxygen at night now and she can tell a big difference in the way she feels. Denies any acute exacerbations. States she is feeling better.  Provided patient with education about the role of exercise in the management of COPD Advised patient to engage in light exercise as tolerated 3-5 days a week Provided education about and advised patient to utilize infection prevention strategies to reduce risk of respiratory infection. 11-27-2020: Review of high risk of infection. The patient denies any new issues at this time.  Self-Care Activities:  Patient verbalizes understanding of plan to effectively manage COPD Self administers  medications as prescribed Attends all scheduled provider appointments Calls pharmacy for medication refills Attends church or other social activities Performs ADL's independently Performs IADL's independently Calls provider office for new concerns or questions Patient Goals: - do breathing exercises at least 2 times each day - do exercises in a comfortable position that  makes breathing as easy as possible - develop a new routine to improve sleep - don't eat or exercise right before bedtime - eat healthy - get at least 7 to 8 hours of sleep at night - get outdoors every day (weather permitting) - keep room cool and dark - limit daytime naps - practice relaxation or meditation daily - use a fan or white noise in bedroom - use devices that will help like a cane, sock-puller or reacher - develop a rescue plan - eliminate symptom triggers at home - follow rescue plan if symptoms flare-up - keep follow-up appointments - use an extra pillow to sleep - avoid second hand smoke - eliminate smoking in my home - identify and avoid work-related triggers - identify and remove indoor air pollutants - limit outdoor activity during cold weather - listen for public air quality announcements every day Follow Up Plan: Telephone follow up appointment with care management team member scheduled for: 01-15-2021 at 145 pm    Care Plan : RNCM: Diabetes Type 2 (Adult)  Updates made by Vanita Ingles, RN since 11/27/2020 12:00 AM     Problem: RNCM: Glycemic Management (Diabetes, Type 2)   Priority: High     Long-Range Goal: RNCM: Glycemic Management Optimized   Start Date: 04/13/2020  Expected End Date: 08/05/2021  This Visit's Progress: On track  Recent Progress: Not on track  Priority: High  Note:   Objective:  Lab Results  Component Value Date   HGBA1C 7.7 (A) 09/07/2019   HGBA1C 7.7 09/07/2019   HGBA1C 7.7 (A) 09/07/2019     Hemoglobin A1C at endocrinology 12-23-2019 was 8.9, Hemoglobin A1C 05-2020: 8.8- results form [facility]. 10-09-2020" Last record shows hemoglobin A1C on 09-07-2020 was 7.6 Lab Results  Component Value Date   CREATININE 0.81 09/07/2019   CREATININE 0.74 01/04/2019   CREATININE 0.77 10/10/2017   No results found for: EGFR Current Barriers:  Knowledge Deficits related to basic Diabetes pathophysiology and self  care/management Knowledge Deficits related to medications used for management of diabetes Limited Social Support Unable to independently manage DM Unable to self administer medications as prescribed Does not contact provider office for questions/concerns DOES NOT have a blood glucose meter to check CBG's. 11-27-2020: Has a meter and is checking blood sugars now.  Case Manager Clinical Goal(s):  Collaboration with [name], NP regarding development and update of comprehensive plan of care as evidenced by provider attestation and co-signature Inter-disciplinary care team collaboration (see longitudinal plan of care)  patient will demonstrate improved adherence to prescribed treatment plan for diabetes self care/management as evidenced by:  daily monitoring and recording of CBG  adherence to ADA/ carb modified diet adherence to prescribed medication regimen Interventions:  Provided education to patient about basic DM disease process Reviewed medications with patient and discussed importance of medication adherence. 10-09-2020: States she is compliant with her medications but is not checking her blood sugars due to not being able to find her meter. 11-27-2020: Is compliant with medications at this time.  Discussed plans with patient for ongoing care management follow up and provided patient with direct contact information for care management team Provided patient with written educational  materials related to hypo and hyperglycemia and importance of correct treatment. 07-03-2020: The patient states that she has not had any new hypoglycemic events. The patient denies any since the episode several months ago when she was riding with her daughter. The patient states that she thinks her provider adding the Lasix has helped her with her blood sugars also. Education and support given. The patient states that her blood sugar this am was 145. 10-09-2020:The patient states she cannot find her meter and cannot  take her blood sugars at this time. She also states she needs a new endocrinologist and ask for a consult. Will send and inbasket message to the pcp and pharm D for assistance. Expressed the importance of follow up for DM and effective control. 11-27-2020: Has a meter and is checking her blood sugars up to 3 times a day. States most of the time fasting is 140-160. Education on goal of fasting ,130 and post prandial of <180. Review of goal of hemoglobin A1C of ,7.0 Review of patient status, including review of consultants reports, relevant laboratory and other test results, and medications completed. Patient Goals/Self-Care Activities patient will:  - UNABLE to independently manage DM Self administers oral medications as prescribed Attends all scheduled provider appointments Checks blood sugars as prescribed and utilize hyper and hypoglycemia protocol as needed Adheres to prescribed ADA/carb modified - barriers to adherence to treatment plan identified - blood glucose monitoring encouraged - blood glucose readings reviewed - resources required to improve adherence to care identified - self-awareness of signs/symptoms of hypo or hyperglycemia encouraged - use of blood glucose monitoring log promoted Follow Up Plan: Telephone follow up appointment with care management team member scheduled for: 01-15-2021 at 145 pm    Care Plan : RNCM: Depression (Adult)  Updates made by Vanita Ingles, RN since 11/27/2020 12:00 AM     Problem: RNCM: Depression Identification (Depression)   Priority: High     Long-Range Goal: RNCM: Depressive and anxiety  Symptoms Identified   Start Date: 04/17/2020  Expected End Date: 08/05/2021  This Visit's Progress: On track  Recent Progress: Not on track  Priority: High  Note:   Current Barriers:  Chronic Disease Management support and education needs related to depression and anxiety Lacks caregiver support.  Unable to independently manage depression Lacks social  connections Does not contact provider office for questions/concerns  Nurse Case Manager Clinical Goal(s):  patient will verbalize understanding of plan for effective management of anxiety and depression  patient will work with [place], CCM team and pcp to address needs related to depression and anxiety exacerbations , the patient will demonstrate ongoing self health care management ability as evidenced by taking medications as directed, following the plan of care and working with the CCM team to optimize health and well being.   Interventions:  1:1 collaboration with [name], NP regarding development and update of comprehensive plan of care as evidenced by provider attestation and co-signature Inter-disciplinary care team collaboration (see longitudinal plan of care) Evaluation of current treatment plan related to anxiety and depression  and patient's adherence to plan as established by provider. 07-03-2020: The patient states she is doing okay. She has moments when she gets very anxious about things but she does what she can to calm herself down. She denies any acute distress at this time. Has a gym membership and doing well with this. She is going 2 days a week. Her daughter plans to get a gym membership so she can go with  her. Will continue to monitor for changes. 10-09-2020: The patient had a recent break up with her boyfriend and this is causing a lot of stress and her nerves are bad. Education on trying to not focus on the break up but on her health and well being and making sure she is there for her family. Her elderly father is also sick and this is impacting her depression and anxiety. Will collaborate with the LCSW for any new ideas or recommendations. She currently works with the [employer] and has follow up coming up. Will continue to monitor for changes. 11-27-2020: The patient states she is doing okay. She fed her father lunch today. She states he does not know her but she goes anyway and helps  him with lunch. Has good days and bad days. Denies any acute exacerbations at this time. Will continue to monitor.  Advised patient to call the office for changes in mood, anxiety, or depression Provided education to patient re: talking to [facility] when she is in a depressed state, working with CCM team during exacerbations  Discussed plans with patient for ongoing care management follow up and provided patient with direct contact information for care management team  Patient Goals/Self-Care Activities patient will:  - Patient will attend all scheduled provider appointments Patient will call pharmacy for medication refills Patient will attend church or other social activities Patient will call provider office for new concerns or questions Patient will work with BSW to address care coordination needs and will continue to work with the clinical team to address health care and disease management related needs.   - anxiety screen reviewed - depression screen reviewed - medication list reviewed  Follow Up Plan: Telephone follow up appointment with care management team member scheduled for: 01-15-2021 at 145 pm        Care Plan : RNCM: HLD  Updates made by Vanita Ingles, RN since 11/27/2020 12:00 AM     Problem: RNCM: Management of HLD   Priority: High     Long-Range Goal: RNCM: Management of HLD   Start Date: 11/27/2020  Expected End Date: 11/27/2021  This Visit's Progress: Not on track  Priority: High  Note:   Current Barriers:  Poorly controlled hyperlipidemia, complicated by smoker, copd, DM Current antihyperlipidemic regimen: was taking Lovaza 1 gram BID but her insurance stopped paying for it. Will consult with pharm D. For recommendations, states she has not taken for "a while"  No current lipid panel for review  Most recent lipid panel:     Component Value Date/Time   CHOL 205 (H) 01/04/2019 0929   CHOL 120 12/24/2016 1827   TRIG 87 01/04/2019 0929   HDL 40 (L) 01/04/2019  0929   HDL 29 (L) 12/24/2016 1827   CHOLHDL 5.1 (H) 01/04/2019 0929   LDLCALC 146 (H) 01/04/2019 0929   ASCVD risk enhancing conditions: age 27, DM, HTN, current smoker Unable to independently manage HLD Lacks social connections Does not contact provider office for questions/concerns RN Care Manager Clinical Goal(s):  patient will work with Consulting civil engineer, providers, and care team towards execution of optimized self-health management plan patient will verbalize understanding of plan for effective management of HLD patient will work with [place], CCM team and pcp to address needs related to insurance not paying for Lovaza and alternative methods of HLD control patient will take all medications exactly as prescribed and will call provider for medication related questions patient will demonstrate improved adherence to prescribed treatment plan for HLD  patient will demonstrate improved health management independence patient will work with CM team pharmacist to insurance stopped paying for Lovaza 1 gram Bid and the patient can not afford the 145.00 a month. She has not been taking for "awhile"  the patient will demonstrate ongoing self health care management ability Interventions: Collaboration with [name], NP regarding development and update of comprehensive plan of care as evidenced by provider attestation and co-signature Inter-disciplinary care team collaboration (see longitudinal plan of care) Medication review performed; medication list updated in electronic medical record.  Inter-disciplinary care team collaboration (see longitudinal plan of care) Referred to pharmacy team for assistance with HLD medication management. 8-29-222: The patient states the insurance has stopped paying for Lovaza and she has not been taking it for "awhile".  Evaluation of current treatment plan related to HLd and patient's adherence to plan as established by provider. Advised patient to call the office  for questions or concerns Pharmacy referral for help with Lovaza cost constraints or recommendations for HLD management.  Discussed plans with patient for ongoing care management follow up and provided patient with direct contact information for care management team Patient Goals/Self-Care Activities: - call for medicine refill 2 or 3 days before it runs out - call if I am sick and can't take my medicine - keep a list of all the medicines I take; vitamins and herbals too - learn to read medicine labels - use a pillbox to sort medicine - use an alarm clock or phone to remind me to take my medicine - change to whole grain breads, cereal, pasta - drink 6 to 8 glasses of water each day - eat 3 to 5 servings of fruits and vegetables each day - eat 5 or 6 small meals each day - eat fish at least once per week - fill half the plate with nonstarchy vegetables - join a weight loss program - keep a food diary - limit fast food meals to no more than 1 per week - be open to making changes - I can manage, know and watch for signs of a heart attack - if I have chest pain, call for help - learn about small changes that will make a big difference - learn my personal risk factors  Follow Up Plan: Telephone follow up appointment with care management team member scheduled for: 01-15-2021 at 145 pm       Plan:Telephone follow up appointment with care management team member scheduled for:  01-15-2021 at 145 pm  Noreene Larsson RN, MSN, [place] Mobile: [phone]

## 2020-12-06 ENCOUNTER — Telehealth: Payer: Medicare HMO

## 2020-12-06 ENCOUNTER — Telehealth: Payer: Self-pay | Admitting: Pharmacist

## 2020-12-06 ENCOUNTER — Other Ambulatory Visit: Payer: Self-pay | Admitting: Family Medicine

## 2020-12-06 DIAGNOSIS — U071 COVID-19: Secondary | ICD-10-CM

## 2020-12-06 DIAGNOSIS — R0602 Shortness of breath: Secondary | ICD-10-CM

## 2020-12-06 NOTE — Telephone Encounter (Signed)
Requested Prescriptions  Pending Prescriptions Disp Refills  . albuterol (VENTOLIN HFA) 108 (90 Base) MCG/ACT inhaler [Pharmacy Med Name: ALBUTEROL HFA (PROVENTIL) INH] 6.7 each 1    Sig: INHALE 1-2 PUFFS BY MOUTH EVERY 6 HOURS AS NEEDED FOR WHEEZE OR SHORTNESS OF BREATH     Pulmonology:  Beta Agonists Failed - 12/06/2020  9:36 AM      Failed - One inhaler should last at least one month. If the patient is requesting refills earlier, contact the patient to check for uncontrolled symptoms.      Passed - Valid encounter within last 12 months    Recent Outpatient Visits          4 months ago Type 2 diabetes mellitus with complication, with long-term current use of insulin [facility])   [facility], [name], NP   5 months ago Fatigue, unspecified type   [place], NP   7 months ago Urinary frequency   [facility], [name], FNP   1 year ago [place], FNP   1 year ago Screening for cervical cancer   [facility], [name], FNP

## 2020-12-06 NOTE — Telephone Encounter (Signed)
  Chronic Care Management   Outreach Note  12/06/2020 Name: Rhya Hackworth MRN: [id number] DOB: May 29, 1965  Referred by: [name], NP Reason for referral : No chief complaint on file.   Reach patient by phone. She reports that she has had a death in her family and asks that we reschedule to another day  Follow Up Plan: CM Pharmacist will outreach by telephone again in the next 14 days  Wallace Cullens, PharmD, [place] [facility] [phone]

## 2020-12-15 ENCOUNTER — Telehealth: Payer: Medicare HMO

## 2020-12-15 ENCOUNTER — Telehealth: Payer: Self-pay | Admitting: Pharmacist

## 2020-12-15 NOTE — Telephone Encounter (Signed)
  Chronic Care Management   Outreach Note  12/15/2020 Name: Brandi Calderon MRN: [id number] DOB: Oct 31, 1965  Referred by: [name], NP Reason for referral : No chief complaint on file.   Was unable to reach patient via telephone today and have left HIPAA compliant voicemail asking patient to return my call.    Follow Up Plan: Will collaborate with Care Guide to outreach to schedule follow up with me  Wallace Cullens, PharmD, [place] [facility] [phone]

## 2020-12-20 ENCOUNTER — Other Ambulatory Visit: Payer: Self-pay

## 2020-12-20 DIAGNOSIS — E118 Type 2 diabetes mellitus with unspecified complications: Secondary | ICD-10-CM

## 2020-12-20 MED ORDER — METFORMIN HCL 1000 MG PO TABS: 1000.0000 mg | ORAL_TABLET | Freq: Two times a day (BID) | ORAL | 0 refills | Status: DC

## 2020-12-21 NOTE — Telephone Encounter (Signed)
Patient has been rescheduled.

## 2020-12-22 ENCOUNTER — Other Ambulatory Visit: Payer: Self-pay

## 2020-12-30 DIAGNOSIS — J449 Chronic obstructive pulmonary disease, unspecified: Secondary | ICD-10-CM | POA: Diagnosis not present

## 2021-01-01 ENCOUNTER — Ambulatory Visit (INDEPENDENT_AMBULATORY_CARE_PROVIDER_SITE_OTHER): Payer: Medicare HMO | Admitting: Pharmacist

## 2021-01-01 DIAGNOSIS — E1169 Type 2 diabetes mellitus with other specified complication: Secondary | ICD-10-CM

## 2021-01-01 NOTE — Chronic Care Management (AMB) (Signed)
Chronic Care Management Pharmacy Note  01/01/2021 Name:  Brandi Calderon MRN:  [id number] DOB:  11-Sep-1965   Subjective: Brandi Calderon is an 55 y.o. year old female who is a primary patient of [name], NP.  The CCM team was consulted for assistance with disease management and care coordination needs.    Engaged with patient by telephone for follow up visit in response to provider referral for pharmacy case management and/or care coordination services.   Consent to Services:  The patient was given information about Chronic Care Management services, agreed to services, and gave verbal consent prior to initiation of services.  Please see initial visit note for detailed documentation.   Patient Care Team: [name], NP as PCP - General (Internal Medicine) Dr. Leonides Schanz (Psychiatry) Dr. Kenton Kingfisher (Gynecology) Tawni Millers, MD (Internal Medicine) Vanita Ingles, RN as Registered Nurse ([place]) Curley Spice, Virl Diamond, [place] as Pharmacist (Pharmacist) Rebekah Chesterfield, LCSW as Social Worker (Licensed Clinical Social Worker)  Hospital visits: None in previous 6 months  Objective:  Lab Results  Component Value Date   CREATININE 0.83 09/11/2020   CREATININE 0.81 09/07/2019   CREATININE 0.74 01/04/2019    Lab Results  Component Value Date   HGBA1C 7.7 (A) 09/07/2019   HGBA1C 7.7 09/07/2019   HGBA1C 7.7 (A) 09/07/2019   Last diabetic Eye exam:  Lab Results  Component Value Date/Time   HMDIABEYEEXA No Retinopathy 02/21/2020 12:00 AM    Last diabetic Foot exam: No results found for: HMDIABFOOTEX      Component Value Date/Time   CHOL 205 (H) 01/04/2019 0929   CHOL 120 12/24/2016 1827   TRIG 87 01/04/2019 0929   HDL 40 (L) 01/04/2019 0929   HDL 29 (L) 12/24/2016 1827   CHOLHDL 5.1 (H) 01/04/2019 0929   LDLCALC 146 (H) 01/04/2019 0929    Hepatic Function Latest Ref Rng & Units 09/11/2020 09/07/2019 01/04/2019  Total Protein 6.5 - 8.1 g/dL 7.3 7.2 7.1   Albumin 3.5 - 5.0 g/dL 4.3 - -  AST 15 - 41 U/L 13(L) 10 11  ALT 0 - 44 U/L _0 Alk Phosphatase 38 - 126 U/L 87 - -  Total Bilirubin 0.3 - 1.2 mg/dL 0.3 0.4 0.3    Social History   Tobacco Use  Smoking Status Every Day   Packs/day: 3.00   Years: 33.00   Pack years: 99.00   Types: Cigarettes  Smokeless Tobacco Never  Tobacco Comments   2PPD 10/10/2020   BP Readings from Last 3 Encounters:  10/10/20 120/66  09/11/20 107/68  08/30/20 124/64   Pulse Readings from Last 3 Encounters:  10/10/20 89  09/11/20 72  08/30/20 76   Wt Readings from Last 3 Encounters:  10/10/20 131 lb (59.4 kg)  08/30/20 130 lb 9.6 oz (59.2 kg)  07/26/20 133 lb (60.3 kg)    Assessment: Review of patient past medical history, allergies, medications, health status, including review of consultants reports, laboratory and other test data, was performed as part of comprehensive evaluation and provision of chronic care management services.   SDOH:  (Social Determinants of Health) assessments and interventions performed: yes SDOH Interventions    Flowsheet Row Most Recent Value  SDOH Interventions   SDOH Interventions for the Following Domains Physical Activity  Physical Activity Interventions --  [Encourage patient to increase physical activity]       CCM Care Plan  Allergies  Allergen Reactions   Penicillins Anaphylaxis  Has patient had a PCN reaction causing immediate rash, facial/tongue/throat swelling, SOB or lightheadedness with hypotension: Yes Has patient had a PCN reaction causing severe rash involving mucus membranes or skin necrosis: No Has patient had a PCN reaction that required hospitalization: No Has patient had a PCN reaction occurring within the last 10 years: No If all of the above answers are "NO", then may proceed with Cephalosporin use.   Throat swells   Victoza [Liraglutide]     Severe nausea   Vicodin [Hydrocodone-Acetaminophen] Rash    And hives     Medications Reviewed Today     Reviewed by [name], RPH-CPP (Pharmacist) on 01/01/21 at [phone]  Med List Status: <None>   Medication Order Taking? Sig Documenting Provider Last Dose Status Informant  albuterol (VENTOLIN HFA) 108 (90 Base) MCG/ACT inhaler [id number] Yes INHALE 1-2 PUFFS BY MOUTH EVERY 6 HOURS AS NEEDED FOR WHEEZE OR SHORTNESS OF BREATH [name], [name], NP Taking Active   Aspirin-Salicylamide-Caffeine (BC HEADACHE POWDER PO) [id number]  Take 4 packets by mouth daily as needed (pain).  Patient not taking: Reported on 11/10/2020   [provider]  Active   atorvastatin (LIPITOR) 20 MG tablet [id number] Yes Take 1 tablet (20 mg total) by mouth daily at 6 PM. [name], NP Taking Active   Blood Glucose Monitoring Suppl [facility]) w/Device KIT [id number]  1 Device by Does not apply route daily. [name], NP  Active   buPROPion (WELLBUTRIN XL) 150 MG 24 hr tablet [id number]  Take 150 mg by mouth daily. [provider]  Active   clonazePAM (KLONOPIN) 0.5 MG tablet [id number]  Take 0.5 mg by mouth 2 (two) times daily as needed for anxiety (Sometimes takes 3 tablets a day).  [provider]  Active Self           Med Note [name], [place]   Thu Sep 05, 2016  9:54 AM)    DULoxetine (CYMBALTA) 60 MG capsule [id number]  Take 60 mg by mouth daily. [provider]  Active   fluticasone (FLONASE) 50 MCG/ACT nasal spray [id number]  SPRAY 2 SPRAYS INTO EACH NOSTRIL EVERY DAY [name], [name], NP  Active   Fluticasone-Umeclidin-Vilant (TRELEGY ELLIPTA) 100-62.5-25 MCG/INH AEPB [id number] Yes Inhale 1 puff into the lungs daily. [name], FNP Taking Active   furosemide (LASIX) 40 MG tablet [id number]  TAKE 1 TABLET BY MOUTH EVERY DAY [name], [name], NP  Active   gabapentin (NEURONTIN) 300 MG capsule [id number]  Take by mouth 3 (three) times daily as needed. [provider]  Active   glucose blood test strip [id number]  Use as  instructed [name], NP  Active   JARDIANCE 25 MG TABS tablet [id number] Yes Take 25 mg by mouth daily. [provider] Taking Active   Lancets Glory Rosebush ULTRASOFT) lancets [id number]  Use as instructed [name], NP  Active   medroxyPROGESTERone (PROVERA) 10 MG tablet [id number]  Take 1 tablet (10 mg total) by mouth daily. [name], NP  Active   metFORMIN (GLUCOPHAGE) 1000 MG tablet [id number] Yes Take 1 tablet (1,000 mg total) by mouth 2 (two) times daily with a meal. [name], NP Taking Active   pantoprazole (PROTONIX) 40 MG tablet [id number]  Take 1 tablet (40 mg total) by mouth daily. [name], NP  Active   TRULICITY 4.5 BU/0.3JQ [place] Aid [id number] Yes Inject 0.5 mLs (4.5 mg total) subcutaneously every 7 (seven) days [provider]  Taking Active   Med List Note [name], RN 06/25/18 1062): UDS 06-25-2018            Patient Active Problem List   Diagnosis Date Noted   Snoring 08/30/2020   Neutrophilia 08/30/2020   Smoker 08/30/2020   Mixed simple and mucopurulent chronic bronchitis ([place]) 07/26/2020   Gastroesophageal reflux disease without esophagitis 07/26/2020   Hyperlipidemia associated with type 2 diabetes mellitus ([place]) 05/24/2020   Anxiety and depression 07/23/2018   Chronic pain syndrome 07/23/2018   Neuropathy due to type 2 diabetes mellitus ([place]) - severe 12/19/2015   Type 2 diabetes mellitus with other specified complication ([place]) 69/48/5462    Immunization History  Administered Date(s) Administered   Influenza Inj Mdck Quad Pf 02/07/2016   Influenza,inj,Quad PF,6+ Mos 01/04/2019   Influenza-Unspecified 02/18/2017   PFIZER Comirnaty([place])Covid-19 Tri-Sucrose Vaccine 11/10/2019    Conditions to be addressed/monitored: T2DM, HLD, COPD  Care Plan : General Pharmacy (Adult)  Updates made by [name], RPH-CPP since 01/01/2021 12:00 AM     Problem: Disease Progression      Long-Range Goal:  Disease Progression Prevented or Minimized   Start Date: 03/06/2020  Expected End Date: 06/04/2020  This Visit's Progress: On track  Recent Progress: On track  Priority: High  Note:   Current Barriers:  Unable to self administer medications as prescribed Lack of blood sugar readings for clinical team  Pharmacist Clinical Goal(s):  Over the next 90 days, patient will achieve adherence to monitoring guidelines and medication adherence to achieve therapeutic efficacy. through collaboration with PharmD and provider.   Interventions: Inter-disciplinary care team collaboration (see longitudinal plan of care) 1:1 collaboration with [name], NP regarding development and update of comprehensive plan of care as evidenced by provider attestation and co-signature Encourage patient to follow up with office today to schedule Medicare Wellness Exam ~ end of October  Type 2 Diabetes: Improved control based on latest A1C result from Endocrinologist; current treatment:  Metformin 1000 mg twice daily Trulicity 4.5 mg weekly on Tuesdays Jardiance 25 mg daily Reports recent blood sugar readings ranging: morning: 145-160 Encourage patient to checking blood sugars fasting each morning and before bed 2-3 nights/week as directed by Endocrinologist Denies s/s of hypoglycemia Reports working on changing her eating habits and limiting carbohydrate portion size. Reports has been eating more vegetables Counsel patient on impact of starchy versus non-starchy vegetables on blood sugar Exercise: reports has been using treadmill at the gym or step machine at home for ~5 minutes/day, currently trying to go every other day Encourage patient to work on increasing the time that she exercises to ~10 minutes/day  COPD: Current treatment: Trelegy - 1 puff daily Albuterol - 1-2 puffs every 6 hours as needed Overnight oxygen Reports using Trelegy inhaler daily as directed, rinsing mouth after each use and using rescue  (albuterol) inhaler as needed as directed Encourage patient to follow up with lung cancer screening program to reschedule appointment  Hyperlipidemia: Current treatment: atorvastatin 20 mg daily Previous therapies tried: omega 3 (Lovaza) - unaffordable  Will follow up with PCP to request provider order lipid panel for next Office Visit  Medication Adherence: Reports currently taking medication from pill bottles Reports interested in switching presriptions to [place] for pill packaging, as pharmacy is close to her home, but has not followed up with pharmacy Patient reports interested in making this change after upcoming appointment with PCP that she will schedule for end of October Encourage patient to restart  using weekly pillbox until starts pill packaging to aid with medication adherence     Patient Goals/Self-Care Activities Over the next 90 days, patient will:  Focus on medication adherence by using weekly pillbox as adherence tool Check blood glucose in morning and at bedtime and bring meter to future appointments with Endocrinologist Attend medical appointments as scheduled  Follow Up Plan: Telephone follow up appointment with care management team member scheduled for: 02/05/2021 at 11:15 AM     Patient's preferred pharmacy is:  [id number] [place] [id number]: [phone]: [phone]  Follow Up:  Patient agrees to Care Plan and Follow-up.  EWallace Cullens PharmD, BPara March CPP Clinical Pharmacist [phone]

## 2021-01-01 NOTE — Patient Instructions (Signed)
Visit Information  PATIENT GOALS:  Goals Addressed             This Visit's Progress    Pharmacy Goals       Our goal A1c is less than 7%. This corresponds with fasting sugars less than 130 and 2 hour after meal sugars less than 180. Please check your blood sugar each morning and again before bed 2-3 nights per week.  Please bring blood sugar log and/or meter to every visit with Endocrinologist  Our goal bad cholesterol, or LDL, is less than 70 . This is why it is important to continue taking your atorvastatin  Feel free to call me with any questions or concerns. I look forward to our next call!   Wallace Cullens, PharmD, [place] [phone]        The patient verbalized understanding of instructions, educational materials, and care plan provided today and declined offer to receive copy of patient instructions, educational materials, and care plan.   Telephone follow up appointment with care management team member scheduled for: 02/05/2021 at 11:15 AM

## 2021-01-02 ENCOUNTER — Other Ambulatory Visit: Payer: Self-pay | Admitting: *Deleted

## 2021-01-02 DIAGNOSIS — Z87891 Personal history of nicotine dependence: Secondary | ICD-10-CM

## 2021-01-02 DIAGNOSIS — F17210 Nicotine dependence, cigarettes, uncomplicated: Secondary | ICD-10-CM

## 2021-01-05 ENCOUNTER — Other Ambulatory Visit: Payer: Self-pay | Admitting: Internal Medicine

## 2021-01-05 NOTE — Telephone Encounter (Signed)
Requested Prescriptions  Pending Prescriptions Disp Refills  . fluticasone (FLONASE) 50 MCG/ACT nasal spray [Pharmacy Med Name: FLUTICASONE PROP 50 MCG SPRAY] 16 mL 1    Sig: SPRAY 2 SPRAYS INTO EACH NOSTRIL EVERY DAY     Ear, Nose, and Throat: Nasal Preparations - Corticosteroids Passed - 01/05/2021  8:47 AM      Passed - Valid encounter within last 12 months    Recent Outpatient Visits          5 months ago Type 2 diabetes mellitus with complication, with long-term current use of insulin [facility])   [facility], [name], NP   6 months ago Fatigue, unspecified type   [place], NP   8 months ago Urinary frequency   [place], FNP   1 year ago [place], [name], [place]   1 year ago Screening for cervical cancer   [facility], [name], FNP      Future Appointments            In 1 week Rico Ala, NP [facility]

## 2021-01-10 NOTE — Progress Notes (Signed)
Virtual Visit via Video Note  I connected with Brandi Calderon on 01/10/21 at 12:00 PM EDT by a video enabled telemedicine application and verified that I am speaking with the correct person using two identifiers.  Location: Patient: Home Provider: Working from home    I discussed the limitations of evaluation and management by telemedicine and the availability of in person appointments. The patient expressed understanding and agreed to proceed.  Shared Decision Making Visit Lung Cancer Screening Program [phone])   Eligibility: Age 55 y.o. Pack Years Smoking History Calculation 60 (# packs/per year x # years smoked) Recent History of coughing up blood  no Unexplained weight loss? no ( >Than 15 pounds within the last 6 months ) Prior History Lung / other cancer no (Diagnosis within the last 5 years already requiring surveillance chest CT Scans). Smoking Status Current Smoker Former Smokers: Years since quit: NA  Quit Date: NA  Visit Components: Discussion included one or more decision making aids. yes Discussion included risk/benefits of screening. yes Discussion included potential follow up diagnostic testing for abnormal scans. yes Discussion included meaning and risk of over diagnosis. yes Discussion included meaning and risk of False Positives. yes Discussion included meaning of total radiation exposure. yes  Counseling Included: Importance of adherence to annual lung cancer LDCT screening. yes Impact of comorbidities on ability to participate in the program. yes Ability and willingness to under diagnostic treatment. yes  Smoking Cessation Counseling: Current Smokers:  Discussed importance of smoking cessation. yes Information about tobacco cessation classes and interventions provided to patient. yes Patient provided with "ticket" for LDCT Scan. yes Symptomatic Patient. no  Counseling(Intermediate counseling: > three minutes) 99406 Diagnosis Code: Tobacco Use  Z72.0 Asymptomatic Patient no  Counseling (Intermediate counseling: > three minutes counseling) [id number] Former Smokers:  Discussed the importance of maintaining cigarette abstinence. yes Diagnosis Code: Personal History of Nicotine Dependence. T51.761 Information about tobacco cessation classes and interventions provided to patient. Yes Patient provided with "ticket" for LDCT Scan. yes Written Order for Lung Cancer Screening with LDCT placed in Epic. Yes (CT Chest Lung Cancer Screening Low Dose W/O CM) [id number] Z12.2-Screening of respiratory organs Z87.891-Personal history of nicotine dependence    I spent 25 minutes of face to face time with her discussing the risks and benefits of lung cancer screening. We viewed a power point together that explained in detail the above noted topics. We took the time to pause the power point at intervals to allow for questions to be asked and answered to ensure understanding. We discussed that she had taken the single most powerful action possible to decrease her risk of developing lung cancer when she quit smoking. I counseled her to remain smoke free, and to contact me if she ever had the desire to smoke again so that I can provide resources and tools to help support the effort to remain smoke free. We discussed the time and location of the scan, and that either  Doroteo Glassman RN or I will call with the results within  24-48 hours of receiving them. She has my card and contact information in the event she needs to speak with me, in addition to a copy of the power point we reviewed as a resource. She verbalized understanding of all of the above and had no further questions upon leaving the office.     I explained to the patient that there has been a high incidence of coronary artery disease noted on these exams. I explained that this is  a non-gated exam therefore degree or severity cannot be determined. This patient is on statin therapy. I have asked the patient  to follow-up with their PCP regarding any incidental finding of coronary artery disease and management with diet or medication as they feel is clinically indicated. The patient verbalized understanding of the above and had no further questions.   Justine Dines D. Kenton Kingfisher, NP-C [facility] Personal contact information can be found on Amion  01/10/2021, 6:14 PM

## 2021-01-12 ENCOUNTER — Telehealth (INDEPENDENT_AMBULATORY_CARE_PROVIDER_SITE_OTHER): Payer: Medicare HMO | Admitting: Acute Care

## 2021-01-12 ENCOUNTER — Other Ambulatory Visit: Payer: Self-pay | Admitting: Internal Medicine

## 2021-01-12 ENCOUNTER — Encounter: Payer: Self-pay | Admitting: Acute Care

## 2021-01-12 DIAGNOSIS — E782 Mixed hyperlipidemia: Secondary | ICD-10-CM | POA: Diagnosis not present

## 2021-01-12 DIAGNOSIS — R69 Illness, unspecified: Secondary | ICD-10-CM | POA: Diagnosis not present

## 2021-01-12 DIAGNOSIS — F17210 Nicotine dependence, cigarettes, uncomplicated: Secondary | ICD-10-CM | POA: Diagnosis not present

## 2021-01-12 DIAGNOSIS — E119 Type 2 diabetes mellitus without complications: Secondary | ICD-10-CM | POA: Diagnosis not present

## 2021-01-12 DIAGNOSIS — E118 Type 2 diabetes mellitus with unspecified complications: Secondary | ICD-10-CM

## 2021-01-12 NOTE — Telephone Encounter (Signed)
Requested Prescriptions  Pending Prescriptions Disp Refills  . metFORMIN (GLUCOPHAGE) 1000 MG tablet [Pharmacy Med Name: METFORMIN HCL 1,000 MG TABLET] 60 tablet 0    Sig: TAKE 1 TABLET (1,000 MG TOTAL) BY MOUTH 2 (TWO) TIMES DAILY WITH A MEAL.     Endocrinology:  Diabetes - Biguanides Failed - 01/12/2021  8:33 AM      Failed - HBA1C is between 0 and 7.9 and within 180 days    Hemoglobin A1C  Date Value Ref Range Status  09/07/2019 7.7 (A) 4.0 - 5.6 % Final  04/05/2015 11.6  Final   HbA1c, POC (controlled diabetic range)  Date Value Ref Range Status  09/07/2019 7.7 (A) 0.0 - 7.0 % Final   HbA1c POC (<> result, manual entry)  Date Value Ref Range Status  09/07/2019 7.7 4.0 - 5.6 % Final         Passed - Cr in normal range and within 360 days    Creat  Date Value Ref Range Status  09/07/2019 0.81 0.50 - 1.05 mg/dL Final    Comment:    For patients >63 years of age, the reference limit for Creatinine is approximately 13% higher for people identified as African-American. .    Creatinine, Ser  Date Value Ref Range Status  09/11/2020 0.83 0.44 - 1.00 mg/dL Final         Passed - eGFR in normal range and within 360 days    GFR, Est African American  Date Value Ref Range Status  09/07/2019 97 > OR = 60 mL/min/1.22m Final   GFR, Est Non African American  Date Value Ref Range Status  09/07/2019 84 > OR = 60 mL/min/1.719mFinal   GFR, Estimated  Date Value Ref Range Status  09/11/2020 >60 >60 mL/min Final    Comment:    (NOTE) Calculated using the CKD-EPI Creatinine Equation (2021)          Passed - Valid encounter within last 6 months    Recent Outpatient Visits          5 months ago Type 2 diabetes mellitus with complication, with long-term [facility] use [facility]  [facility]   7 months ago Fatigue, unspecified type   [place]   9 months ago Urinary frequency   [facility]   1 year ago [place]   1 year ago [facility] for cervical cancer   [facility]      Future Appointments            In 4 days [name], ReCoralie KeensNP [facility]

## 2021-01-15 ENCOUNTER — Telehealth: Payer: Medicare HMO | Admitting: General Practice

## 2021-01-15 ENCOUNTER — Other Ambulatory Visit: Payer: Self-pay

## 2021-01-15 ENCOUNTER — Ambulatory Visit
Admission: RE | Admit: 2021-01-15 | Discharge: 2021-01-15 | Disposition: A | Discharge status: Home / Self Care | Payer: Medicare HMO | Source: Ambulatory Visit | Attending: Acute Care | Admitting: Acute Care

## 2021-01-15 ENCOUNTER — Ambulatory Visit: Payer: Self-pay

## 2021-01-15 DIAGNOSIS — R0602 Shortness of breath: Secondary | ICD-10-CM

## 2021-01-15 DIAGNOSIS — F17200 Nicotine dependence, unspecified, uncomplicated: Secondary | ICD-10-CM

## 2021-01-15 DIAGNOSIS — F17210 Nicotine dependence, cigarettes, uncomplicated: Secondary | ICD-10-CM | POA: Diagnosis not present

## 2021-01-15 DIAGNOSIS — Z794 Long term (current) use of insulin: Secondary | ICD-10-CM

## 2021-01-15 DIAGNOSIS — Z87891 Personal history of nicotine dependence: Secondary | ICD-10-CM | POA: Insufficient documentation

## 2021-01-15 DIAGNOSIS — E1169 Type 2 diabetes mellitus with other specified complication: Secondary | ICD-10-CM

## 2021-01-15 DIAGNOSIS — E785 Hyperlipidemia, unspecified: Secondary | ICD-10-CM

## 2021-01-15 DIAGNOSIS — F32A Depression, unspecified: Secondary | ICD-10-CM

## 2021-01-15 DIAGNOSIS — F331 Major depressive disorder, recurrent, moderate: Secondary | ICD-10-CM

## 2021-01-15 DIAGNOSIS — I10 Essential (primary) hypertension: Secondary | ICD-10-CM

## 2021-01-15 DIAGNOSIS — J449 Chronic obstructive pulmonary disease, unspecified: Secondary | ICD-10-CM

## 2021-01-15 DIAGNOSIS — R69 Illness, unspecified: Secondary | ICD-10-CM | POA: Diagnosis not present

## 2021-01-15 NOTE — Chronic Care Management (AMB) (Signed)
Chronic Care Management   CCM RN Visit Note  01/15/2021 Name: Brandi Calderon MRN: [id number] DOB: 1965-06-23  Subjective: Brandi Calderon is a 55 y.o. year old female who is a primary care patient of [name], NP. The care management team was consulted for assistance with disease management and care coordination needs.    Engaged with patient by telephone for follow up visit in response to provider referral for case management and/or care coordination services.   Consent to Services:  The patient was given information about Chronic Care Management services, agreed to services, and gave verbal consent prior to initiation of services.  Please see initial visit note for detailed documentation.   Patient agreed to services and verbal consent obtained.   Assessment: Review of patient past medical history, allergies, medications, health status, including review of consultants reports, laboratory and other test data, was performed as part of comprehensive evaluation and provision of chronic care management services.   SDOH (Social Determinants of Health) assessments and interventions performed:  SDOH Interventions    Flowsheet Row Most Recent Value  SDOH Interventions   Food Insecurity Interventions Intervention Not Indicated  Financial Strain Interventions Intervention Not Indicated  Housing Interventions Intervention Not Indicated  Intimate Partner Violence Interventions Intervention Not Indicated  Physical Activity Interventions Other (Comments)  [no structured activity]  Stress Interventions Other (Comment)  [wants to work on smoking cessation]  Social Connections Interventions Other (Comment)  [good family support]  Transportation Interventions Intervention Not Indicated  Depression Interventions/Treatment  --  [the patient lost her son in 2016. The patient smokes 2 ppd, working with LCSW, trying to take ownership of her health and well being]        CCM Care  Plan  Allergies  Allergen Reactions   Penicillins Anaphylaxis    Has patient had a PCN reaction causing immediate rash, facial/tongue/throat swelling, SOB or lightheadedness with hypotension: Yes Has patient had a PCN reaction causing severe rash involving mucus membranes or skin necrosis: No Has patient had a PCN reaction that required hospitalization: No Has patient had a PCN reaction occurring within the last 10 years: No If all of the above answers are "NO", then may proceed with Cephalosporin use.   Throat swells   Victoza [Liraglutide]     Severe nausea   Vicodin [Hydrocodone-Acetaminophen] Rash    And hives    Outpatient Encounter Medications as of 01/15/2021  Medication Sig   atorvastatin (LIPITOR) 20 MG tablet Take 1 tablet (20 mg total) by mouth daily at 6 PM.   JARDIANCE 25 MG TABS tablet Take 25 mg by mouth daily.   metFORMIN (GLUCOPHAGE) 1000 MG tablet TAKE 1 TABLET (1,000 MG TOTAL) BY MOUTH 2 (TWO) TIMES DAILY WITH A MEAL.   TRULICITY 4.5 WU/9.8JX SOPN Inject 0.5 mLs (4.5 mg total) subcutaneously every 7 (seven) days   albuterol (VENTOLIN HFA) 108 (90 Base) MCG/ACT inhaler INHALE 1-2 PUFFS BY MOUTH EVERY 6 HOURS AS NEEDED FOR WHEEZE OR SHORTNESS OF BREATH   Aspirin-Salicylamide-Caffeine (BC HEADACHE POWDER PO) Take 4 packets by mouth daily as needed (pain). (Patient not taking: Reported on 11/10/2020)   Blood Glucose Monitoring Suppl (ONETOUCH ULTRALINK) w/Device KIT 1 Device by Does not apply route daily.   buPROPion (WELLBUTRIN XL) 150 MG 24 hr tablet Take 150 mg by mouth daily.   clonazePAM (KLONOPIN) 0.5 MG tablet Take 0.5 mg by mouth 2 (two) times daily as needed for anxiety (Sometimes takes 3 tablets a day).    DULoxetine (  CYMBALTA) 60 MG capsule Take 60 mg by mouth daily.   fluticasone (FLONASE) 50 MCG/ACT nasal spray SPRAY 2 SPRAYS INTO EACH NOSTRIL EVERY DAY   Fluticasone-Umeclidin-Vilant (TRELEGY ELLIPTA) 100-62.5-25 MCG/INH AEPB Inhale 1 puff into the lungs  daily.   furosemide (LASIX) 40 MG tablet TAKE 1 TABLET BY MOUTH EVERY DAY   gabapentin (NEURONTIN) 300 MG capsule Take by mouth 3 (three) times daily as needed.   glucose blood test strip Use as instructed   Lancets (ONETOUCH ULTRASOFT) lancets Use as instructed   medroxyPROGESTERone (PROVERA) 10 MG tablet Take 1 tablet (10 mg total) by mouth daily.   pantoprazole (PROTONIX) 40 MG tablet Take 1 tablet (40 mg total) by mouth daily.   No facility-administered encounter medications on file as of 01/15/2021.    Patient Active Problem List   Diagnosis Date Noted   Snoring 08/30/2020   Neutrophilia 08/30/2020   Smoker 08/30/2020   Mixed simple and mucopurulent chronic bronchitis ([place]) 07/26/2020   Gastroesophageal reflux disease without esophagitis 07/26/2020   Hyperlipidemia associated with type 2 diabetes mellitus ([place]) 05/24/2020   Anxiety and depression 07/23/2018   Chronic pain syndrome 07/23/2018   Neuropathy due to type 2 diabetes mellitus ([place]) - severe 12/19/2015   Type 2 diabetes mellitus with other specified complication ([place]) 96/28/3662    Conditions to be addressed/monitored:HTN, HLD, COPD, DMII, Anxiety, Depression, and Tobacco Use  Care Plan : RNCM: COPD (Adult)  Updates made by Vanita Ingles, RN since 01/15/2021 12:00 AM  Completed 01/15/2021   Problem: RNCM: Psychological Adjustment to Diagnosis (COPD) Resolved 01/15/2021  Priority: High     Long-Range Goal: RNCM: COPD Adjustment to Disease Achieved Completed 01/15/2021  Start Date: 04/17/2020  Expected End Date: 08/05/2021  Recent Progress: On track  Priority: High  Note:   Current Barriers: Resolving due to duplicate goal Knowledge deficits related to basic understanding of COPD disease process Knowledge deficits related to basic COPD self care/management Knowledge deficit related to basic understanding of how to use inhalers and how inhaled medications work Knowledge deficit related to importance of energy  conservation Limited Social Support Unable to independently manage COPD Lacks social connections Unable to perform IADLs independently Does not maintain contact with provider office Does not contact provider office for questions/concerns  Case Manager Clinical Goal(s): patient will report using inhalers as prescribed including rinsing mouth after use patient will report utilizing pursed lip breathing for shortness of breath patient will verbalize understanding of COPD action plan and when to seek appropriate levels of medical care patient will engage in lite exercise as tolerated to build/regain stamina and strength and reduce shortness of breath through activity tolerance patient will verbalize basic understanding of COPD disease process and self care activities patient will not be hospitalized for COPD exacerbation as evidenced  Interventions:  Collaboration with [name], NP regarding development and update of comprehensive plan of care as evidenced by provider attestation and co-signature Inter-disciplinary care team collaboration (see longitudinal plan of care) Provided patient with basic written and verbal COPD education on self care/management/and exacerbation prevention  Provided patient with COPD action plan and reinforced importance of daily self assessment Provided written and verbal instructions on pursed lip breathing and utilized returned demonstration as teach back Provided instruction about proper use of medications used for management of COPD including inhalers Advised patient to self assesses COPD action plan zone and make appointment with provider if in the yellow zone for 48 hours without improvement. 11-27-2020: The patient is using oxygen  at night now and she can tell a big difference in the way she feels. Denies any acute exacerbations. States she is feeling better.  Provided patient with education about the role of exercise in the management of COPD Advised  patient to engage in light exercise as tolerated 3-5 days a week Provided education about and advised patient to utilize infection prevention strategies to reduce risk of respiratory infection. 11-27-2020: Review of high risk of infection. The patient denies any new issues at this time.  Self-Care Activities:  Patient verbalizes understanding of plan to effectively manage COPD Self administers medications as prescribed Attends all scheduled provider appointments Calls pharmacy for medication refills Attends church or other social activities Performs ADL's independently Performs IADL's independently Calls provider office for new concerns or questions Patient Goals: - do breathing exercises at least 2 times each day - do exercises in a comfortable position that makes breathing as easy as possible - develop a new routine to improve sleep - don't eat or exercise right before bedtime - eat healthy - get at least 7 to 8 hours of sleep at night - get outdoors every day (weather permitting) - keep room cool and dark - limit daytime naps - practice relaxation or meditation daily - use a fan or white noise in bedroom - use devices that will help like a cane, sock-puller or reacher - develop a rescue plan - eliminate symptom triggers at home - follow rescue plan if symptoms flare-up - keep follow-up appointments - use an extra pillow to sleep - avoid second hand smoke - eliminate smoking in my home - identify and avoid work-related triggers - identify and remove indoor air pollutants - limit outdoor activity during cold weather - listen for public air quality announcements every day Follow Up Plan: Telephone follow up appointment with care management team member scheduled for: 01-15-2021 at 145 pm    Care Plan : RNCM: Diabetes Type 2 (Adult)  Updates made by Vanita Ingles, RN since 01/15/2021 12:00 AM  Completed 01/15/2021   Problem: RNCM: Glycemic Management (Diabetes, Type 2)  Resolved 01/15/2021  Priority: High     Long-Range Goal: RNCM: Glycemic Management Optimized Completed 01/15/2021  Start Date: 04/13/2020  Expected End Date: 08/05/2021  Recent Progress: On track  Priority: High  Note:   Objective: Resolving due to duplicate goal Lab Results  Component Value Date   HGBA1C 7.7 (A) 09/07/2019   HGBA1C 7.7 09/07/2019   HGBA1C 7.7 (A) 09/07/2019     Hemoglobin A1C at endocrinology 12-23-2019 was 8.9, Hemoglobin A1C 05-2020: 8.8- results form [facility]. 10-09-2020" Last record shows hemoglobin A1C on 09-07-2020 was 7.6 Lab Results  Component Value Date   CREATININE 0.81 09/07/2019   CREATININE 0.74 01/04/2019   CREATININE 0.77 10/10/2017   No results found for: EGFR Current Barriers:  Knowledge Deficits related to basic Diabetes pathophysiology and self care/management Knowledge Deficits related to medications used for management of diabetes Limited Social Support Unable to independently manage DM Unable to self administer medications as prescribed Does not contact provider office for questions/concerns DOES NOT have a blood glucose meter to check CBG's. 11-27-2020: Has a meter and is checking blood sugars now.  Case Manager Clinical Goal(s):  Collaboration with [name], NP regarding development and update of comprehensive plan of care as evidenced by provider attestation and co-signature Inter-disciplinary care team collaboration (see longitudinal plan of care)  patient will demonstrate improved adherence to prescribed treatment plan for diabetes self care/management as evidenced  by:  daily monitoring and recording of CBG  adherence to ADA/ carb modified diet adherence to prescribed medication regimen Interventions:  Provided education to patient about basic DM disease process Reviewed medications with patient and discussed importance of medication adherence. 10-09-2020: States she is compliant with her medications but is not checking her  blood sugars due to not being able to find her meter. 11-27-2020: Is compliant with medications at this time.  Discussed plans with patient for ongoing care management follow up and provided patient with direct contact information for care management team Provided patient with written educational materials related to hypo and hyperglycemia and importance of correct treatment. 07-03-2020: The patient states that she has not had any new hypoglycemic events. The patient denies any since the episode several months ago when she was riding with her daughter. The patient states that she thinks her provider adding the Lasix has helped her with her blood sugars also. Education and support given. The patient states that her blood sugar this am was 145. 10-09-2020:The patient states she cannot find her meter and cannot take her blood sugars at this time. She also states she needs a new endocrinologist and ask for a consult. Will send and inbasket message to the pcp and pharm D for assistance. Expressed the importance of follow up for DM and effective control. 11-27-2020: Has a meter and is checking her blood sugars up to 3 times a day. States most of the time fasting is 140-160. Education on goal of fasting ,130 and post prandial of <180. Review of goal of hemoglobin A1C of ,7.0 Review of patient status, including review of consultants reports, relevant laboratory and other test results, and medications completed. Patient Goals/Self-Care Activities patient will:  - UNABLE to independently manage DM Self administers oral medications as prescribed Attends all scheduled provider appointments Checks blood sugars as prescribed and utilize hyper and hypoglycemia protocol as needed Adheres to prescribed ADA/carb modified - barriers to adherence to treatment plan identified - blood glucose monitoring encouraged - blood glucose readings reviewed - resources required to improve adherence to care identified - self-awareness of  signs/symptoms of hypo or hyperglycemia encouraged - use of blood glucose monitoring log promoted Follow Up Plan: Telephone follow up appointment with care management team member scheduled for: 01-15-2021 at 145 pm    Care Plan : RNCM: Depression (Adult)  Updates made by Vanita Ingles, RN since 01/15/2021 12:00 AM  Completed 01/15/2021   Problem: RNCM: Depression Identification (Depression) Resolved 01/15/2021  Priority: High     Long-Range Goal: RNCM: Depressive and anxiety  Symptoms Identified Completed 01/15/2021  Start Date: 04/17/2020  Expected End Date: 08/05/2021  Recent Progress: On track  Priority: High  Note:   Current Barriers: Resolving due to duplicate goal Chronic Disease Management support and education needs related to depression and anxiety Lacks caregiver support.  Unable to independently manage depression Lacks social connections Does not contact provider office for questions/concerns  Nurse Case Manager Clinical Goal(s):  patient will verbalize understanding of plan for effective management of anxiety and depression  patient will work with [place], CCM team and pcp to address needs related to depression and anxiety exacerbations , the patient will demonstrate ongoing self health care management ability as evidenced by taking medications as directed, following the plan of care and working with the CCM team to optimize health and well being.   Interventions:  1:1 collaboration with [name], NP regarding development and update of comprehensive plan of care as  evidenced by provider attestation and co-signature Inter-disciplinary care team collaboration (see longitudinal plan of care) Evaluation of current treatment plan related to anxiety and depression  and patient's adherence to plan as established by provider. 07-03-2020: The patient states she is doing okay. She has moments when she gets very anxious about things but she does what she can to calm herself down.  She denies any acute distress at this time. Has a gym membership and doing well with this. She is going 2 days a week. Her daughter plans to get a gym membership so she can go with her. Will continue to monitor for changes. 10-09-2020: The patient had a recent break up with her boyfriend and this is causing a lot of stress and her nerves are bad. Education on trying to not focus on the break up but on her health and well being and making sure she is there for her family. Her elderly father is also sick and this is impacting her depression and anxiety. Will collaborate with the LCSW for any new ideas or recommendations. She currently works with the [employer] and has follow up coming up. Will continue to monitor for changes. 11-27-2020: The patient states she is doing okay. She fed her father lunch today. She states he does not know her but she goes anyway and helps him with lunch. Has good days and bad days. Denies any acute exacerbations at this time. Will continue to monitor.  Advised patient to call the office for changes in mood, anxiety, or depression Provided education to patient re: talking to [facility] when she is in a depressed state, working with CCM team during exacerbations  Discussed plans with patient for ongoing care management follow up and provided patient with direct contact information for care management team  Patient Goals/Self-Care Activities patient will:  - Patient will attend all scheduled provider appointments Patient will call pharmacy for medication refills Patient will attend church or other social activities Patient will call provider office for new concerns or questions Patient will work with BSW to address care coordination needs and will continue to work with the clinical team to address health care and disease management related needs.   - anxiety screen reviewed - depression screen reviewed - medication list reviewed  Follow Up Plan: Telephone follow up appointment with care  management team member scheduled for: 01-15-2021 at 145 pm        Care Plan : RNCM: HLD  Updates made by Vanita Ingles, RN since 01/15/2021 12:00 AM  Completed 01/15/2021   Problem: RNCM: Management of HLD Resolved 01/15/2021  Priority: High     Long-Range Goal: RNCM: Management of HLD Completed 01/15/2021  Start Date: 11/27/2020  Expected End Date: 11/27/2021  Recent Progress: Not on track  Priority: High  Note:   Current Barriers: Resolving due to duplicate goal Poorly controlled hyperlipidemia, complicated by smoker, copd, DM Current antihyperlipidemic regimen: was taking Lovaza 1 gram BID but her insurance stopped paying for it. Will consult with pharm D. For recommendations, states she has not taken for "a while"  No current lipid panel for review  Most recent lipid panel:     Component Value Date/Time   CHOL 205 (H) 01/04/2019 0929   CHOL 120 12/24/2016 1827   TRIG 87 01/04/2019 0929   HDL 40 (L) 01/04/2019 0929   HDL 29 (L) 12/24/2016 1827   CHOLHDL 5.1 (H) 01/04/2019 0929   LDLCALC 146 (H) 01/04/2019 0929   ASCVD risk enhancing conditions: age 4,  DM, HTN, current smoker Unable to independently manage HLD Lacks social connections Does not contact provider office for questions/concerns RN Care Manager Clinical Goal(s):  patient will work with Consulting civil engineer, providers, and care team towards execution of optimized self-health management plan patient will verbalize understanding of plan for effective management of HLD patient will work with [place], CCM team and pcp to address needs related to insurance not paying for Lovaza and alternative methods of HLD control patient will take all medications exactly as prescribed and will call provider for medication related questions patient will demonstrate improved adherence to prescribed treatment plan for HLD patient will demonstrate improved health management independence patient will work with CM team pharmacist to  insurance stopped paying for Lovaza 1 gram Bid and the patient can not afford the 145.00 a month. She has not been taking for "awhile"  the patient will demonstrate ongoing self health care management ability Interventions: Collaboration with [name], NP regarding development and update of comprehensive plan of care as evidenced by provider attestation and co-signature Inter-disciplinary care team collaboration (see longitudinal plan of care) Medication review performed; medication list updated in electronic medical record.  Inter-disciplinary care team collaboration (see longitudinal plan of care) Referred to pharmacy team for assistance with HLD medication management. [id number]: The patient states the insurance has stopped paying for Lovaza and she has not been taking it for "awhile".  Evaluation of current treatment plan related to HLd and patient's adherence to plan as established by provider. Advised patient to call the office for questions or concerns Pharmacy referral for help with Lovaza cost constraints or recommendations for HLD management.  Discussed plans with patient for ongoing care management follow up and provided patient with direct contact information for care management team Patient Goals/Self-Care Activities: - call for medicine refill 2 or 3 days before it runs out - call if I am sick and can't take my medicine - keep a list of all the medicines I take; vitamins and herbals too - learn to read medicine labels - use a pillbox to sort medicine - use an alarm clock or phone to remind me to take my medicine - change to whole grain breads, cereal, pasta - drink 6 to 8 glasses of water each day - eat 3 to 5 servings of fruits and vegetables each day - eat 5 or 6 small meals each day - eat fish at least once per week - fill half the plate with nonstarchy vegetables - join a weight loss program - keep a food diary - limit fast food meals to no more than 1 per week - be  open to making changes - I can manage, know and watch for signs of a heart attack - if I have chest pain, call for help - learn about small changes that will make a big difference - learn my personal risk factors  Follow Up Plan: Telephone follow up appointment with care management team member scheduled for: 01-15-2021 at 145 pm      Care Plan : RNCM: General Plan of Care (Adult) for Chronic Disease Managment and Care Coordination needs  Updates made by Vanita Ingles, RN since 01/15/2021 12:00 AM     Problem: RNCM: Development of Care Plan for Chronic Disease Management for Care Coordination Needs (COPD/HTN/HLD/DM/ Anxiety/Depression/Smoker)   Priority: High     Long-Range Goal: RNCM: Development of Care Plan for Chronic Disease Management for Care Coordination Needs (COPD/HTN/HLD/DM/ Anxiety/Depression/Smoker)   Start Date: 01/15/2021  Expected  End Date: 01/15/2022  Priority: High  Note:   Current Barriers:  Knowledge Deficits related to plan of care for management of HTN, HLD, COPD, DMII, and Anxiety with Excessive Worry, Panic Symptoms, Social Anxiety,, Depression: depressed mood anxiety disturbed sleep, and Grief  Care Coordination needs related to Substance abuse issues -  2 ppd smoker wanting to quit   Chronic Disease Management support and education needs related to HTN, HLD, COPD, DMII, and Anxiety with Excessive Worry, Social Anxiety,, Depression: depressed mood anxiety disturbed sleep, and Grief Lacks caregiver support.  Non-adherence to scheduled provider appointments Non-adherence to prescribed medication regimen  RNCM Clinical Goal(s):  Patient will verbalize understanding of plan for management of HTN, HLD, COPD, DMII, Anxiety, Depression, and Tobacco Use  verbalize basic understanding of HTN, HLD, COPD, DMII, Anxiety, Depression, and Tobacco Use disease process and self health management plan   take all medications exactly as prescribed and will call provider  for medication related questions demonstrate understanding of rationale for each prescribed medication and take as prescribed  attend all scheduled medical appointments: 01-16-2021 at 240 pm with the pcp  demonstrate improved and ongoing adherence to prescribed treatment plan for HTN, HLD, COPD, DMII, Anxiety, Depression, and Tobacco Use as evidenced by daily monitoring and recording of CBG  adherence to ADA/ carb modified diet adherence to prescribed medication regimen contacting provider for new or worsened symptoms or questions and working with the CCM team to optimize health and well being demonstrate improved and ongoing health management independence   continue to work with Consulting civil engineer and/or Social Worker to address care management and care coordination needs related to HTN, HLD, COPD, DMII, Anxiety, Depression, and Tobacco Use  demonstrate a decrease in HTN, HLD, COPD, DMII, Anxiety, Depression, and Tobacco Use exacerbations   demonstrate ongoing self health care management ability and effective management of chronic diseases  through collaboration with RN Care manager, provider, and care team.   Interventions: 1:1 collaboration with primary care provider regarding development and update of comprehensive plan of care as evidenced by provider attestation and co-signature Inter-disciplinary care team collaboration (see longitudinal plan of care) Evaluation of current treatment plan related to  self management and patient's adherence to plan as established by provider   SDOH Barriers (Status: Goal on track: YES.)  Patient interviewed and SDOH assessment performed        SDOH Interventions    Flowsheet Row Most Recent Value  SDOH Interventions   Food Insecurity Interventions Intervention Not Indicated  Financial Strain Interventions Intervention Not Indicated  Housing Interventions Intervention Not Indicated  Intimate Partner Violence Interventions Intervention Not Indicated   Physical Activity Interventions Other (Comments)  [no structured activity]  Stress Interventions Other (Comment)  [wants to work on smoking cessation]  Social Connections Interventions Other (Comment)  [good family support]  Transportation Interventions Intervention Not Indicated  Depression Interventions/Treatment  --  [the patient lost her son in 2016. The patient smokes 2 ppd, working with LCSW, trying to take ownership of her health and well being]     Patient interviewed and appropriate assessments performed Discussed plans with patient for ongoing care management follow up and provided patient with direct contact information for care management team Advised patient to call the office for changed in [place] or needs that the patient may need assistance with  Assisted patient/caregiver with obtaining information about health plan benefits- 01-15-2021: Review of OTC benefits and if the patient has an OTC benefit plan    COPD: (Status: Goal  on track: YES.) Reviewed medications with patient, including use of prescribed maintenance and rescue inhalers, and provided instruction on medication management and the importance of adherence Provided patient with basic written and verbal COPD education on self care/management/and exacerbation prevention. 01-15-2021: The patient had a CT screening today to evaluate for lung cancer or other issues; Advised patient to track and manage COPD triggers. 01-15-2021: Knows that smoking cessation will help her with her COPD exacerbation. Has Chantix and wants to set a start date for smoking cessation;  Provided written and verbal instructions on pursed lip breathing and utilized returned demonstration as teach back; Provided instruction about proper use of medications used for management of COPD including inhalers; Advised patient to self assesses COPD action plan zone and make appointment with provider if in the yellow zone for 48 hours without improvement.  01-15-2021: The patient is using supplemental oxygen at night and states this is very helpful for her and she feels more rested when she gets up each morning.  Advised patient to engage in light exercise as tolerated 3-5 days a week to aid in the the management of COPD; Provided education about and advised patient to utilize infection prevention strategies to reduce risk of respiratory infection; Discussed the importance of adequate rest and management of fatigue with COPD;  Diabetes:  (Status: Goal on track: NO.) Lab Results  Component Value Date   HGBA1C 7.7 (A) 09/07/2019   HGBA1C 7.7 09/07/2019   HGBA1C 7.7 (A) 09/07/2019  At endocrinologist office on 01-12-2021: A1C was 7.8 Assessed patient's understanding of A1c goal: <7% Provided education to patient about basic DM disease process; Reviewed medications with patient and discussed importance of medication adherence;        Reviewed prescribed diet with patient heart healthy/ADA diet; Counseled on importance of regular laboratory monitoring as prescribed. 01-15-2021: Recent lab work with endocrinologist on 01-12-2021 with A1C of 7.8;        Discussed plans with patient for ongoing care management follow up and provided patient with direct contact information for care management team;      Provided patient with written educational materials related to hypo and hyperglycemia and importance of correct treatment;       Reviewed scheduled/upcoming provider appointments including: 01-16-2021 with pcp at 240 pm;         Advised patient, providing education and rationale, to check cbg as directed  and record        call provider for findings outside established parameters;       Review of patient status, including review of consultants reports, relevant laboratory and other test results, and medications completed;        Depression and Anxiety  (Status: Goal on track: YES.) Evaluation of current treatment plan related to Anxiety and Depression,  Mental Health Concerns  self-management and patient's adherence to plan as established by provider. Discussed plans with patient for ongoing care management follow up and provided patient with direct contact information for care management team Advised patient to call the office for changes in mood, depression or anxiety; Provided education to patient re: triggers that could cause exacerbation of depression and anxiety, also about the griefshare program since she lost her son in 2016. She states this has negatively impacted her helath and she is really trying to do much better; Reviewed medications with patient and discussed compliance; Provided patient with anxiety and depression educational materials related to effective measures in managing anxiety and depression. Has MyChart and is receptive to eductional  information; Reviewed scheduled/upcoming provider appointments including 01-16-2021 at 240 pm; Social Work referral for ongoing support and education of depression and anxiety, currently established and working with the [facility]; Pharmacy referral for ongoing support and education for medication management and medications needs, the patient is currenlty established with the pharm D and working with the pharm D for needs; Discussed plans with patient for ongoing care management follow up and provided patient with direct contact information for care management team; Advised patient to discuss insomnia and to write down questions to ask the provider and discuss these with provider; Screening for signs and symptoms of depression related to chronic disease state;  Assessed social determinant of health barriers;   Hyperlipidemia:  (Status: Goal on track: NO.) Lab Results  Component Value Date   CHOL 205 (H) 01/04/2019   HDL 40 (L) 01/04/2019   LDLCALC 146 (H) 01/04/2019   TRIG 87 01/04/2019   CHOLHDL 5.1 (H) 01/04/2019     Medication review performed; medication list updated in electronic medical  record.  Provider established cholesterol goals reviewed; Counseled on importance of regular laboratory monitoring as prescribed; Provided HLD educational materials; Reviewed role and benefits of statin for ASCVD risk reduction; Discussed strategies to manage statin-induced myalgias; Reviewed importance of limiting foods high in cholesterol; Screening for signs and symptoms of depression related to chronic disease state;  Assessed social determinant of health barriers;   Hypertension: (Status: Goal on track: YES.) Last practice recorded BP readings:  BP Readings from Last 3 Encounters:  10/10/20 120/66  09/11/20 107/68  08/30/20 124/64  Most recent eGFR/CrCl: No results found for: EGFR  No components found for: CRCL  Evaluation of current treatment plan related to hypertension self management and patient's adherence to plan as established by provider;   Provided education to patient re: stroke prevention, s/s of heart attack and stroke; Reviewed prescribed diet heart healthy/ADA diet Reviewed medications with patient and discussed importance of compliance;  Counseled on adverse effects of illicit drug and excessive alcohol use in patients with high blood pressure;  Discussed plans with patient for ongoing care management follow up and provided patient with direct contact information for care management team; Advised patient, providing education and rationale, to monitor blood pressure daily and record, calling PCP for findings outside established parameters;  Reviewed scheduled/upcoming provider appointments including:  Provided education on prescribed diet Heart healthy/ADA;  Discussed complications of poorly controlled blood pressure such as heart disease, stroke, circulatory complications, vision complications, kidney impairment, sexual dysfunction;   Smoking Cessation: (Status: Goal on track: NO.) Reviewed smoking history:  tobacco abuse of 38 plus years; currently smoking 2 ppd (  down from 4 ppd) Previous quit attempts, unsuccessful 0 successful using 0  Reports smoking within 30 minutes of waking up Reports triggers to smoke include: grief process and death of her son in Jun 10, 2014. Stressors in life- elderly father sick and in SNF, family stressors, relationship stressors, worried about her health Reports motivation to quit smoking includes: knows it will be beneficial for her health and she will be able to breath better  On a scale of 1-10, reports MOTIVATION to quit is 7 On a scale of 1-10, reports CONFIDENCE in quitting is 5  Evaluation of current treatment plan reviewed; Advised patient to discuss smoking cessation options with provider; Provided contact information for [place] [facility] (1-800-QUIT-NOW); Reviewed smoking cessation techniques: removing cigarettes and smoking materials from environment, stress management, substitution of other forms of reinforcement, support of family/friends, written materials, and pharmacotherapy (  has Chantix but has not started taking yet, wants to set a start date, likely after Christmas 2022) Provided patient with printed smoking cessation educational materials; Reviewed scheduled/upcoming provider appointments including: 01-16-2021 at 240 pm; Discussed plans with patient for ongoing care management follow up and provided patient with direct contact information for care management team;  Patient Goals/Self-Care Activities: Patient will self administer medications as prescribed as evidenced by self report/primary caregiver report  Patient will attend all scheduled provider appointments as evidenced by clinician review of documented attendance to scheduled appointments and patient/caregiver report Patient will call pharmacy for medication refills as evidenced by patient report and review of pharmacy fill history as appropriate Patient will continue to perform ADL's independently as evidenced by patient/caregiver report Patient will continue  to perform IADL's independently as evidenced by patient/caregiver report Patient will call provider office for new concerns or questions as evidenced by review of documented incoming telephone call notes and patient report Patient will work with BSW to address care coordination needs and will continue to work with the clinical team to address health care and disease management related needs as evidenced by documented adherence to scheduled care management/care coordination appointments - schedule appointment with eye doctor, - check blood sugar at prescribed times: before meals and at bedtime and when you have symptoms of low or high blood sugar, - check feet daily for cuts, sores or redness, - enter blood sugar readings and medication or insulin into daily log, - take the blood sugar log to all doctor visits, - trim toenails straight across, - drink 6 to 8 glasses of water each day, - eat fish at least once per week, - fill half of plate with vegetables, - limit fast food meals to no more than 1 per week, - manage portion size, - prepare main meal at home 3 to 5 days each week, - read food labels for fat, fiber, carbohydrates and portion size, - reduce red meat to 2 to 3 times a week, - switch to sugar-free drinks, - keep feet up while sitting, - wash and dry feet carefully every day, - wear comfortable, cotton socks, and - wear comfortable, well-fitting shoes - eliminate smoking in my home, - identify and avoid work-related triggers, - identify and remove indoor air pollutants, - limit outdoor activity during cold weather, - listen for public air quality announcements every day, - do breathing exercises every day, - eliminate symptom triggers at home, - follow rescue plan if symptoms flare-up, - keep follow-up appointments: with pcp and specialist, - use an extra pillow to sleep, - develop a new routine to improve sleep, - eat healthy/prescribed diet: heart healthy/ADA, - get at least 7 to 8 hours of sleep at  night, - use devices that will help like a cane, sock-puller or reacher, - practice relaxation or meditation daily, - do breathing exercises at least 2 times each day, and - do exercises in a comfortable position that makes breathing as easy as possible - check blood pressure 3 times per week, - choose a place to take my blood pressure (home, clinic or office, retail store), - write blood pressure results in a log or diary, - learn about high blood pressure, - keep a blood pressure log, - take blood pressure log to all doctor appointments, - call doctor for signs and symptoms of high blood pressure, - develop an action plan for high blood pressure, - keep all doctor appointments, - take medications for blood pressure exactly as prescribed, -  report new symptoms to your doctor, and - eat more whole grains, fruits and vegetables, lean meats and healthy fats - call for medicine refill 2 or 3 days before it runs out, - take all medications exactly as prescribed, - call doctor with any symptoms you believe are related to your medicine, - call doctor when you experience any new symptoms, - go to all doctor appointments as scheduled, and - adhere to prescribed diet: heart healthy/ADA diet        Plan:Telephone follow up appointment with care management team member scheduled for:  03-05-2021 at 145 pm  Noreene Larsson RN, MSN, [place] Mobile: [phone]

## 2021-01-15 NOTE — Patient Instructions (Signed)
Visit Information  PATIENT GOALS:  Goals Addressed             This Visit's Progress    RNCM: Manage Fatigue (Tiredness-COPD)       Follow Up Date 03/05/2021    - develop a new routine to improve sleep - don't eat or exercise right before bedtime - eat healthy - get at least 7 to 8 hours of sleep at night - get outdoors every day (weather permitting) - keep room cool and dark - limit daytime naps - practice relaxation or meditation daily - use a fan or white noise in bedroom    Why is this important?   Feeling tired or worn out is a common symptom of COPD (chronic obstructive pulmonary disease).  Learning when you feel your best and when you need rest is important.  Managing the tiredness (fatigue) will help you be active and enjoy life.     Notes: The patient states since [place] she is having a hard time with her COPD and overall health.[facility] specialist on 10-10-2020. 11-27-2020: The patient states her breathing is much better. The cooler temperatures  are helping and also the patient is using oxygen at night. Denies any recent exacerbations. 01-15-2021: The patient is using oxygen at night. The patient states she is feeling more rested. She does want to stop smoking. Has picked up chantix but has not set a start date. Likely she will after Christmas. States she is down to 2 ppd but was smoking 4 after her son died in 2014-07-08. The patient had her CT scan today. Education and support given. Will continue to monitor.      RNCM: Monitor and Manage My Blood Sugar-Diabetes Type 2       Timeframe:  Long-Range Goal Priority:  High Start Date:     10-09-2020                        Expected End Date:   10-09-2021                    Follow Up Date 03/05/2021     - check blood sugar at prescribed times - check blood sugar before and after exercise - check blood sugar if I feel it is too high or too low - enter blood sugar readings and medication or insulin into daily log - take the blood  sugar log to all doctor visits - take the blood sugar meter to all doctor visits    Why is this important?   Checking your blood sugar at home helps to keep it from getting very high or very low.  Writing the results in a diary or log helps the doctor know how to care for you.  Your blood sugar log should have the time, date and the results.  Also, write down the amount of insulin or other medicine that you take.  Other information, like what you ate, exercise done and how you were feeling, will also be helpful.     Notes: 10-09-2020: The patient currently does not have a meter to check her blood sugars. Will request a meter today from pcp and ask pharmacist for assistance.  Patient also needs an endocrinologist consult. 11-27-2020: The patient is checking her blood sugars now. Usually BID. States fasting is usually around 140-160. Sometimes she checks it in the afternoon and at night. Review of fasting <130 and post prandial of <180. Education on last hemoglobin  A1C of 7.6 and the goal of A1C of 7.0. 01-12-2021: The patient with most recent A1C of 7.8 at endocrinologist office on 01-12-2021. Review of goal of A1C of 7.0. The patient states the endocrinologist started a new medication and [facility] has it ready. Will continue to monitor.      RNCM: Track and Manage My Symptoms-Depression       Timeframe:  Long-Range Goal Priority:  High Start Date:   10-09-2020                          Expected End Date:           10-09-2021            Follow Up Date 03/05/2021    - avoid negative self-talk - develop a personal safety plan - develop a plan to deal with triggers like holidays, anniversaries - exercise at least 2 to 3 times per week - have a plan for how to handle bad days - journal feelings and what helps to feel better or worse - spend time or talk with others at least 2 to 3 times per week - spend time or talk with others every day - watch for early signs of feeling worse - write in journal every  day    Why is this important?   Keeping track of your progress will help your treatment team find the right mix of medicine and therapy for you.  Write in your journal every day.  Day-to-day changes in depression symptoms are normal. It may be more helpful to check your progress at the end of each week instead of every day.     Notes: Patient states that her and her boyfriend recently broke up and it is causing her a lot of stress and making her nerves bad. Empathetic listening and support. Also her elderly father is sick and she is concerned about him. Education provided. 11-27-2020: The patient states she has good days and bad days. She says she goes to see her father at the nursing home several times a week and today she was there earlier feeding him his lunch. He does not know who she is but she goes and feeds him and spends time with him. 01-15-2021: The patient states she is doing okay. She was not able to talk a lot about her anxiety and depression today due to others being around. She does have goals of wanting to stop smoking and has Chantix to start she just has to do a start date. She denies any acute distress. Feels like she has made good progress since losing her son in 2016. Empathetic listening and support given. Will continue to monitor.      RNCM: Track and Manage My Triggers-COPD       Timeframe:  Long-Range Goal Priority:  High Start Date:   10-09-2020                          Expected End Date:          10-09-2021             Follow Up Date 03/05/2021    - avoid second hand smoke - eliminate smoking in my home - identify and avoid work-related triggers - identify and remove indoor air pollutants - limit outdoor activity during cold weather - listen for public air quality announcements every day    Why is this important?  Triggers are activities or things, like tobacco smoke or cold weather, that make your COPD (chronic obstructive pulmonary disease) flare-up.  Knowing these  triggers helps you plan how to stay away from them.  When you cannot remove them, you can learn how to manage them.     Notes: The patient continues to smoke. Talked about smoking cessation. The patient had a breathing test and is seeing a specialist on 10-10-2020. States she believes she has sleep apnea. Will have a test for sleep apnea coming up. Her daughter will go with her to her appointment tomorrow. 11-27-2020: The patient is using oxygen at night. She can tell that this is helping her a lot. She is feeling more rested than she was. The patient denies any new concerns with her COPD. 01-15-2021: The patient is continuing to use oxygen and this is doing well for her. She had a lung CT today. Her next goal is to quit smoking and she is thinking about a start date for that. Education and support given. Will continue to monitor.         Patient verbalizes understanding of instructions provided today and agrees to view in [place].   Telephone follow up appointment with care management team member scheduled for: 03-05-2021 at 145 pm  Noreene Larsson RN, MSN, [place] Mobile: [phone]

## 2021-01-16 ENCOUNTER — Ambulatory Visit (INDEPENDENT_AMBULATORY_CARE_PROVIDER_SITE_OTHER): Payer: Medicare HMO | Admitting: Internal Medicine

## 2021-01-16 ENCOUNTER — Encounter: Payer: Self-pay | Admitting: Internal Medicine

## 2021-01-16 ENCOUNTER — Other Ambulatory Visit: Payer: Self-pay

## 2021-01-16 VITALS — BP 115/71 | HR 73 | Temp 97.5°F | Resp 17 | Ht 61.0 in | Wt 131.2 lb

## 2021-01-16 DIAGNOSIS — Z1231 Encounter for screening mammogram for malignant neoplasm of breast: Secondary | ICD-10-CM

## 2021-01-16 DIAGNOSIS — Z0000 Encounter for general adult medical examination without abnormal findings: Secondary | ICD-10-CM | POA: Diagnosis not present

## 2021-01-16 NOTE — Progress Notes (Signed)
HPI:  Pt presents to the clinic today for her annual subsequent Medicare Wellness Exam.  Past Medical History:  Diagnosis Date   Anxiety    Arthritis    Asthma    Back pain    COPD (chronic obstructive pulmonary disease) (HCC)    Depression    Diabetes mellitus    Headache    History of COVID-19    Hyperlipidemia    Hypertension    Tendonitis     Current Outpatient Medications  Medication Sig Dispense Refill   albuterol (VENTOLIN HFA) 108 (90 Base) MCG/ACT inhaler INHALE 1-2 PUFFS BY MOUTH EVERY 6 HOURS AS NEEDED FOR WHEEZE OR SHORTNESS OF BREATH 6.7 each 1   Aspirin-Salicylamide-Caffeine (BC HEADACHE POWDER PO) Take 4 packets by mouth daily as needed (pain). (Patient not taking: Reported on 11/10/2020)     atorvastatin (LIPITOR) 20 MG tablet Take 1 tablet (20 mg total) by mouth daily at 6 PM. 90 tablet 0   Blood Glucose Monitoring Suppl (ONETOUCH ULTRALINK) w/Device KIT 1 Device by Does not apply route daily. 1 kit 0   buPROPion (WELLBUTRIN XL) 150 MG 24 hr tablet Take 150 mg by mouth daily.     clonazePAM (KLONOPIN) 0.5 MG tablet Take 0.5 mg by mouth 2 (two) times daily as needed for anxiety (Sometimes takes 3 tablets a day).      DULoxetine (CYMBALTA) 60 MG capsule Take 60 mg by mouth daily.     fluticasone (FLONASE) 50 MCG/ACT nasal spray SPRAY 2 SPRAYS INTO EACH NOSTRIL EVERY DAY 16 mL 1   Fluticasone-Umeclidin-Vilant (TRELEGY ELLIPTA) 100-62.5-25 MCG/INH AEPB Inhale 1 puff into the lungs daily. 30 each 5   furosemide (LASIX) 40 MG tablet TAKE 1 TABLET BY MOUTH EVERY DAY 30 tablet 1   gabapentin (NEURONTIN) 300 MG capsule Take by mouth 3 (three) times daily as needed.     glucose blood test strip Use as instructed 100 each 0   JARDIANCE 25 MG TABS tablet Take 25 mg by mouth daily.     Lancets (ONETOUCH ULTRASOFT) lancets Use as instructed 100 each 12   medroxyPROGESTERone (PROVERA) 10 MG tablet Take 1 tablet (10 mg total) by mouth daily. 30 tablet 11   metFORMIN (GLUCOPHAGE)  1000 MG tablet TAKE 1 TABLET (1,000 MG TOTAL) BY MOUTH 2 (TWO) TIMES DAILY WITH A MEAL. 60 tablet 0   pantoprazole (PROTONIX) 40 MG tablet Take 1 tablet (40 mg total) by mouth daily. 90 tablet 1   TRULICITY 4.5 JJ/9.4RD SOPN Inject 0.5 mLs (4.5 mg total) subcutaneously every 7 (seven) days     No current facility-administered medications for this visit.    Allergies  Allergen Reactions   Penicillins Anaphylaxis    Has patient had a PCN reaction causing immediate rash, facial/tongue/throat swelling, SOB or lightheadedness with hypotension: Yes Has patient had a PCN reaction causing severe rash involving mucus membranes or skin necrosis: No Has patient had a PCN reaction that required hospitalization: No Has patient had a PCN reaction occurring within the last 10 years: No If all of the above answers are "NO", then may proceed with Cephalosporin use.   Throat swells   Victoza [Liraglutide]     Severe nausea   Vicodin [Hydrocodone-Acetaminophen] Rash    And hives    Family History  Problem Relation Age of Onset   Kidney disease Father    Diabetes Father    Dementia Father    Diabetes Sister    Diabetes Brother    Diabetes Mother  Diabetes Brother     Social History   Socioeconomic History   Marital status: Divorced    Spouse name: Not on file   Number of children: Not on file   Years of education: Not on file   Highest education level: Not on file  Occupational History   Not on file  Tobacco Use   Smoking status: Every Day    Packs/day: 1.50    Years: 39.00    Pack years: 58.50    Types: Cigarettes   Smokeless tobacco: Never   Tobacco comments:    2PPD 10/10/2020  Vaping Use   Vaping Use: Never used  Substance and Sexual Activity   Alcohol use: No   Drug use: Yes    Types: Marijuana    Comment: States she smokes MJ when she does not have any Tramadol.   Sexual activity: Not on file  Other Topics Concern   Not on file  Social History Narrative   ** Merged  History Encounter ** In the process of getting SSD for depression, DM and neuropathy      Active smoker [~ 2p/day; cutting down]; no alcohol; lives in [place]; self. Used to work in [employer]    Social Determinants of Health   Financial Resource Strain: Low Risk    Difficulty of Paying Living Expenses: Not hard at all  Food Insecurity: No Food Insecurity   Worried About Charity fundraiser in the Last Year: Never true   [place] in the Last Year: Never true  Transportation Needs: No Transportation Needs   Lack of Transportation (Medical): No   Lack of Transportation (Non-Medical): No  Physical Activity: Inactive   Days of Exercise per Week: 0 days   Minutes of Exercise per Session: 0 min  Stress: No Stress Concern Present   Feeling of Stress : Only a little  Social Connections: Socially Isolated   Frequency of Communication with Friends and Family: More than three times a week   Frequency of Social Gatherings with Friends and Family: More than three times a week   Attends Religious Services: Never   Marine scientist or Organizations: No   Attends Archivist Meetings: Never   Marital Status: Divorced  Human resources officer Violence: Not At Risk   Fear of Current or Ex-Partner: No   Emotionally Abused: No   Physically Abused: No   Sexually Abused: No    Hospitiliaztions: None  Health Maintenance:    Flu: 12/2018  Tetanus: unsure  Pneumovax: never  Shingrix: never  Mammogram: more than 2 years ago  Pap Smear: 08/2019  Colon Screening: 05/2018, every 3 years   Eye Doctor: annually, 01/2020  Dental Exam: as needed, dentures   Providers:   PCP: [name], NP Endocrinologist: Dr. Gabriel Carina  Ophthalmologist: Dr. Gershon Crane  Pulmonologist: Dr. Mortimer Fries  Hematology: Dr. Janeth Rase   I have personally reviewed and have noted:  1. The patient's medical and social history 2. Their use of alcohol, tobacco or illicit drugs 3. Their current medications and  supplements 4. The patient's functional ability including ADL's, fall risks, home safety risks and hearing or visual impairment. 5. Diet and physical activities 6. Evidence for depression or mood disorder  Subjective:   Review of Systems:   Constitutional: Denies fever, malaise, fatigue, headache or abrupt weight changes.  HEENT: Denies eye pain, eye redness, ear pain, ringing in the ears, wax buildup, runny nose, nasal congestion, bloody nose, or sore throat. Respiratory: Denies difficulty breathing,  shortness of breath, cough or sputum production.   Cardiovascular: Denies chest pain, chest tightness, palpitations or swelling in the hands or feet.  Gastrointestinal: Denies abdominal pain, bloating, constipation, diarrhea or blood in the stool.  GU: Denies urgency, frequency, pain with urination, burning sensation, blood in urine, odor or discharge. Musculoskeletal: Pt reports chronic joint pain. Denies decrease in range of motion, difficulty with gait, muscle pain or joint swelling.  Skin: Denies redness, rashes, lesions or ulcercations.  Neurological: Pt reports neuropathy. Denies dizziness, difficulty with memory, difficulty with speech or problems with balance and coordination.  Psych: Pt has a history of anxiety and depression. Denies SI/HI.  No other specific complaints in a complete review of systems (except as listed in HPI above).  Objective:  PE:  BP 115/71 (BP Location: Left Arm, Patient Position: Sitting, Cuff Size: Normal)   Pulse 73   Temp (!) 97.5 F (36.4 C) (Temporal)   Resp 17   Ht 5' 1"  (1.549 m)   Wt 131 lb 3.2 oz (59.5 kg)   SpO2 100%   BMI 24.79 kg/m   Wt Readings from Last 3 Encounters:  01/15/21 131 lb (59.4 kg)  10/10/20 131 lb (59.4 kg)  08/30/20 130 lb 9.6 oz (59.2 kg)    General: Appears her stated age, well developed, well nourished in NAD. Skin: Warm, dry and intact. No ulcerations noted. HEENT: Head: normal shape and size; Eyes:  EOMs intact;   Neck: Neck supple, trachea midline. No masses, lumps or thyromegaly present.  Cardiovascular: Normal rate and rhythm. S1,S2 noted.  No murmur, rubs or gallops noted. No JVD or BLE edema. No carotid bruits noted. Pulmonary/Chest: Normal effort and positive vesicular breath sounds. No respiratory distress. No wheezes, rales or ronchi noted.  Abdomen: Soft and nontender. Normal bowel sounds. No distention or masses noted. Liver, spleen and kidneys non palpable. Musculoskeletal: Strength 5/5 BUE/BLE. No difficulty with gait.  Neurological: Alert and oriented. Cranial nerves II-XII grossly intact. Coordination normal.  Psychiatric: Mood and affect normal. Behavior is normal. Judgment and thought content normal.    BMET    Component Value Date/Time   NA 139 09/11/2020 1448   NA 139 06/25/2017 1027   NA 134 (L) 12/07/2012 1809   K 3.9 09/11/2020 1448   K 3.4 (L) 12/07/2012 1809   CL 96 (L) 09/11/2020 1448   CL 103 12/07/2012 1809   CO2 33 (H) 09/11/2020 1448   CO2 25 12/07/2012 1809   GLUCOSE 124 (H) 09/11/2020 1448   GLUCOSE 274 (H) 12/07/2012 1809   BUN 14 09/11/2020 1448   BUN 10 06/25/2017 1027   BUN 10 12/07/2012 1809   CREATININE 0.83 09/11/2020 1448   CREATININE 0.81 09/07/2019 1019   CALCIUM 9.5 09/11/2020 1448   CALCIUM 8.6 12/07/2012 1809   GFRNONAA >60 09/11/2020 1448   GFRNONAA 84 09/07/2019 1019   GFRAA 97 09/07/2019 1019    Lipid Panel     Component Value Date/Time   CHOL 205 (H) 01/04/2019 0929   CHOL 120 12/24/2016 1827   TRIG 87 01/04/2019 0929   HDL 40 (L) 01/04/2019 0929   HDL 29 (L) 12/24/2016 1827   CHOLHDL 5.1 (H) 01/04/2019 0929   LDLCALC 146 (H) 01/04/2019 0929    CBC    Component Value Date/Time   WBC 10.4 09/11/2020 1448   RBC 4.64 09/11/2020 1448   HGB 13.5 09/11/2020 1448   HGB 12.8 06/25/2017 1027   HCT 40.4 09/11/2020 1448   HCT 38.3 06/25/2017  1027   PLT 285 09/11/2020 1448   PLT 348 06/25/2017 1027   MCV 87.1 09/11/2020 1448   MCV  90 06/25/2017 1027   MCV 80 12/07/2012 1809   MCH 29.1 09/11/2020 1448   MCHC 33.4 09/11/2020 1448   RDW 15.1 09/11/2020 1448   RDW 15.0 06/25/2017 1027   RDW 17.8 (H) 12/07/2012 1809   LYMPHSABS 3.3 09/11/2020 1448   LYMPHSABS 3.3 (H) 09/11/2016 1131   MONOABS 0.5 09/11/2020 1448   EOSABS 0.3 09/11/2020 1448   EOSABS 0.2 09/11/2016 1131   BASOSABS 0.1 09/11/2020 1448   BASOSABS 0.0 09/11/2016 1131    Hgb A1C Lab Results  Component Value Date   HGBA1C 7.7 (A) 09/07/2019   HGBA1C 7.7 09/07/2019   HGBA1C 7.7 (A) 09/07/2019      Assessment and Plan:   Medicare Annual Wellness Visit:  Diet: She does eat meat. She consumes fruits and veggies. She does eat some fried foods. She drinks mostly soda. Physical activity: Silver Sneakers Depression/mood screen: Chronic, PHQ 9 score of 15 Hearing: Intact to whispered voice Visual acuity: Grossly normal, performs annual eye exam  ADLs: Capable Fall risk: None Home safety: Good Cognitive evaluation: Intact to orientation, naming, recall and repetition EOL planning: No adv directives, full code/ I agree  Preventative Medicine: She declines flu shot today. Tetanus UTD. She declines pneumovax. Encouraged her to go to the pharmacy to get her Shingrix vaccine. Pap smear UTD. Mammogram ordered- she will call to schedule. Colon screening UTD. Encouraged her to consume a balanced diet and exercise regimen. Advised her to see an eye doctor and dentist annually. La work from 10/14 reviewed. Due dates for screening exam given to patient as part of her AVS.   Next appointment: 6 months, follow up chronic conditions   [name], NP This visit occurred during the SARS-CoV-2 public health emergency.  Safety protocols were in place, including screening questions prior to the visit, additional usage of staff PPE, and extensive cleaning of exam room while observing appropriate contact time as indicated for disinfecting solutions.

## 2021-01-16 NOTE — Patient Instructions (Signed)

## 2021-01-18 ENCOUNTER — Other Ambulatory Visit: Payer: Self-pay | Admitting: Acute Care

## 2021-01-18 DIAGNOSIS — F17210 Nicotine dependence, cigarettes, uncomplicated: Secondary | ICD-10-CM

## 2021-01-29 DIAGNOSIS — I10 Essential (primary) hypertension: Secondary | ICD-10-CM | POA: Diagnosis not present

## 2021-01-29 DIAGNOSIS — R69 Illness, unspecified: Secondary | ICD-10-CM | POA: Diagnosis not present

## 2021-01-29 DIAGNOSIS — E1169 Type 2 diabetes mellitus with other specified complication: Secondary | ICD-10-CM

## 2021-01-29 DIAGNOSIS — Z794 Long term (current) use of insulin: Secondary | ICD-10-CM

## 2021-01-29 DIAGNOSIS — J449 Chronic obstructive pulmonary disease, unspecified: Secondary | ICD-10-CM | POA: Diagnosis not present

## 2021-01-29 DIAGNOSIS — E785 Hyperlipidemia, unspecified: Secondary | ICD-10-CM | POA: Diagnosis not present

## 2021-01-29 DIAGNOSIS — F32A Depression, unspecified: Secondary | ICD-10-CM

## 2021-01-29 DIAGNOSIS — F419 Anxiety disorder, unspecified: Secondary | ICD-10-CM

## 2021-01-29 DIAGNOSIS — F331 Major depressive disorder, recurrent, moderate: Secondary | ICD-10-CM

## 2021-01-30 ENCOUNTER — Other Ambulatory Visit: Payer: Self-pay | Admitting: Internal Medicine

## 2021-02-05 ENCOUNTER — Telehealth: Payer: Medicare HMO

## 2021-02-05 ENCOUNTER — Telehealth: Payer: Self-pay | Admitting: Pharmacist

## 2021-02-05 NOTE — Telephone Encounter (Signed)
  Chronic Care Management   Outreach Note  02/05/2021 Name: Zadaya Cuadra MRN: [id number] DOB: Jan 05, 1966  Referred by: [name], NP Reason for referral : No chief complaint on file.   Was unable to reach patient via telephone today and have left HIPAA compliant voicemail asking patient to return my call.    Follow Up Plan: Will collaborate with Care Guide to outreach to schedule follow up with me  Wallace Cullens, PharmD, [place] [facility] [phone]

## 2021-02-06 ENCOUNTER — Ambulatory Visit (INDEPENDENT_AMBULATORY_CARE_PROVIDER_SITE_OTHER): Payer: Medicare HMO | Admitting: Licensed Clinical Social Worker

## 2021-02-06 DIAGNOSIS — F32A Depression, unspecified: Secondary | ICD-10-CM

## 2021-02-06 DIAGNOSIS — E1169 Type 2 diabetes mellitus with other specified complication: Secondary | ICD-10-CM

## 2021-02-06 DIAGNOSIS — G894 Chronic pain syndrome: Secondary | ICD-10-CM

## 2021-02-06 DIAGNOSIS — F419 Anxiety disorder, unspecified: Secondary | ICD-10-CM

## 2021-02-06 DIAGNOSIS — Z794 Long term (current) use of insulin: Secondary | ICD-10-CM

## 2021-02-08 NOTE — Patient Instructions (Signed)
Visit Information  Patient Self Care Activities:  Continue compliance with medication management Attend all scheduled provider appointments Call provider office for new concerns or questions Utilize strategies discussed to assist in management of symptoms  Patient verbalizes understanding of instructions provided today and agrees to view in [place].   Telephone follow up appointment with care management team member scheduled for:02/13/21  Christa See, MSW, [email or web address] Phone [phone] 10:11 AM

## 2021-02-08 NOTE — Chronic Care Management (AMB) (Signed)
Chronic Care Management    Clinical Social Work Note  02/08/2021 Name: Brandi Calderon MRN: [id number] DOB: [birth year]  Brandi Calderon is a 55 y.o. year old female who is a primary care patient of [name], NP. The CCM team was consulted to assist the patient with chronic disease management and/or care coordination needs related to: Mental Health Counseling and Resources.   Engaged with patient by telephone for follow up visit in response to provider referral for social work chronic care management and care coordination services.   Consent to Services:  The patient was given information about Chronic Care Management services, agreed to services, and gave verbal consent prior to initiation of services.  Please see initial visit note for detailed documentation.   Patient agreed to services and consent obtained.   Assessment: Review of patient past medical history, allergies, medications, and health status, including review of relevant consultants reports was performed today as part of a comprehensive evaluation and provision of chronic care management and care coordination services.     Consent to Services:  The patient was given information about Care Management services, agreed to services, and gave verbal consent prior to initiation of services.  Please see initial visit note for detailed documentation.   Patient agreed to services today and consent obtained.  Engaged with patient by phone in response to provider referral for social work care coordination services. During visit, patient's phone disconnected. CCM LCSW attempted to reach patient and left messages for a return call. CCM LCSW will follow up next week.  Follow up Plan: Patient would like continued follow-up from CCM LCSW .  per patient's request will follow up in 02/13/21.  Will call office if needed prior to next encounter.  SDOH (Social Determinants of Health) assessments and interventions performed:     Advanced Directives Status: Not addressed in this encounter.  CCM Care Plan  Allergies  Allergen Reactions   Penicillins Anaphylaxis    Has patient had a PCN reaction causing immediate rash, facial/tongue/throat swelling, SOB or lightheadedness with hypotension: Yes Has patient had a PCN reaction causing severe rash involving mucus membranes or skin necrosis: No Has patient had a PCN reaction that required hospitalization: No Has patient had a PCN reaction occurring within the last 10 years: No If all of the above answers are "NO", then may proceed with Cephalosporin use.   Throat swells   Victoza [Liraglutide]     Severe nausea   Vicodin [Hydrocodone-Acetaminophen] Rash    And hives    Outpatient Encounter Medications as of 02/06/2021  Medication Sig   albuterol (VENTOLIN HFA) 108 (90 Base) MCG/ACT inhaler INHALE 1-2 PUFFS BY MOUTH EVERY 6 HOURS AS NEEDED FOR WHEEZE OR SHORTNESS OF BREATH   Aspirin-Salicylamide-Caffeine (BC HEADACHE POWDER PO) Take 4 packets by mouth daily as needed (pain).   atorvastatin (LIPITOR) 20 MG tablet Take 1 tablet (20 mg total) by mouth daily at 6 PM.   Blood Glucose Monitoring Suppl (ONETOUCH ULTRALINK) w/Device KIT 1 Device by Does not apply route daily.   buPROPion (WELLBUTRIN XL) 150 MG 24 hr tablet Take 150 mg by mouth daily.   clonazePAM (KLONOPIN) 0.5 MG tablet Take 0.5 mg by mouth 2 (two) times daily as needed for anxiety (Sometimes takes 3 tablets a day).    DULoxetine (CYMBALTA) 60 MG capsule Take 60 mg by mouth daily.   fluticasone (FLONASE) 50 MCG/ACT nasal spray SPRAY 2 SPRAYS INTO EACH NOSTRIL EVERY DAY   Fluticasone-Umeclidin-Vilant (TRELEGY ELLIPTA) 100-62.5-25  MCG/INH AEPB Inhale 1 puff into the lungs daily.   furosemide (LASIX) 40 MG tablet TAKE 1 TABLET BY MOUTH EVERY DAY   gabapentin (NEURONTIN) 300 MG capsule Take by mouth 3 (three) times daily as needed.   glucose blood test strip Use as instructed   JARDIANCE 25 MG TABS tablet  Take 25 mg by mouth daily.   Lancets (ONETOUCH ULTRASOFT) lancets Use as instructed   medroxyPROGESTERone (PROVERA) 10 MG tablet Take 1 tablet (10 mg total) by mouth daily.   metFORMIN (GLUCOPHAGE) 1000 MG tablet TAKE 1 TABLET (1,000 MG TOTAL) BY MOUTH 2 (TWO) TIMES DAILY WITH A MEAL.   OXYGEN Inhale 1 L into the lungs at bedtime.   pantoprazole (PROTONIX) 40 MG tablet Take 1 tablet (40 mg total) by mouth daily.   TRULICITY 4.5 W[place]/6.3ZC SOPN Inject 0.5 mLs (4.5 mg total) subcutaneously every 7 (seven) days   No facility-administered encounter medications on file as of 02/06/2021.    Patient Active Problem List   Diagnosis Date Noted   Snoring 08/30/2020   Neutrophilia 08/30/2020   Smoker 08/30/2020   Mixed simple and mucopurulent chronic bronchitis ([place]) 07/26/2020   Gastroesophageal reflux disease without esophagitis 07/26/2020   Hyperlipidemia associated with type 2 diabetes mellitus ([place]) 05/24/2020   Anxiety and depression 07/23/2018   Chronic pain syndrome 07/23/2018   Neuropathy due to type 2 diabetes mellitus ([place]) - severe 12/19/2015   Type 2 diabetes mellitus with other specified complication ([place]) 58/85/0277    Conditions to be addressed/monitored: Anxiety and Depression; Mental Health Concerns   Care Plan : General Social Work (Adult)  Updates made by Rebekah [place], LCSW since 02/08/2021 12:00 AM     Problem: Response to Treatment (Depression)      Long-Range Goal: Response to Treatment Maximized   Start Date: 05/23/2020  This Visit's Progress: On track  Recent Progress: On track  Priority: Medium  Note:   Timeframe:  Long-Range Goal Priority:  Medium  Start Date:  05/23/20                         Expected End Date:  02/28/21                    Follow Up Date- 02/13/21  Current Barriers:  Chronic Mental Health needs related to Depression, Anxiety and Grief Limited social support Mental Health Concerns  Social Isolation Loss of child in 2016 Suicidal  Ideation/Homicidal Ideation: No Clinical Social Work Goal(s):  Over the next 120 days, patient will work with SW to address concerns related to care coordination needs and lack of education/support/resource connection. LCSW will assist patient in gaining community resource education and additional support and resource connection as well in order to maintain health and mental health appropriately  Over the next 120 days, patient will demonstrate improved adherence to self care as evidenced by implementing healthy self-care into her daily routine such as: attending all medical appointments, deep breathing exercises, taking time for self-reflection, taking medications as prescribed, drinking water and daily exercise to improve mobility and mood.  Over the next 120 days, patient will work with SW bi-monthly by telephone or in person to reduce or manage symptoms related to stress, anxiety and grief. Over the next 120 days, patient will demonstrate improved health management independence as evidenced by implementing healthy self-care skills and positive support/resources into her daily routine to help cope with stressors and improve overall health and well-being  Over  the next 120 days, patient or caregiver will verbalize basic understanding of depression/stress process and self health management plan as evidenced by her participation in development of long term plan of care and institution of self health management strategies Interventions: Patient interviewed and appropriate assessments performed: brief mental health assessment Patient reports symptoms of depression and anxiety are managed well currently. Triggers include chronic medical conditions and declining health of father (just initiated hospice) 11/08: Patient recently went to court due to a 50B against ex boyfriend. It was continued Patient continues participate in medication management with psychiatrist, Dr. List, through [facility] every two months.  Patient states medications play a large role in management of symptoms. She was recently referred to therapy by Dr. List [facility] and is expecting a call to schedule initial appointment Per patient, her lungs work at 50% capacity. She plans to schedule lung screening in the near future. Patient utilizes two inhalers prescribed by specialist to assist with SOB. Patient shared this occurs when she over exerts self  Patient reports that use of Gabapentin is effective in pain management. She reports frustration with obtaining her prescription for [facility] mg with [facility]. CCM LCSW provided validation and encouragement. LCSW strongly encouraged patient to contact provider to re-send script to [facility] to fill. Patient agreed to do so and has the provider's contact information Strategies to promote self-care and stress management to assist in management of physical and mental health conditions discussed. Emotional support was provided Patient reports strong support from two adult daughters who reside locally. Daughter will accompany patient to medical appointments Solution-Focused Strategies, Mindfulness or Relaxation Training, Active listening / Reflection utilized , Reviewed mental health medications with patient and discussed compliance: , and Verbalization of feelings encouraged  Discussed plans with patient for ongoing care management follow up and provided patient with direct contact information for care management team Advised patient to contact CCM LCSW for any urgent social work related issues 1:1 collaboration with primary care provider regarding development and update of comprehensive plan of care as evidenced by provider attestation and co-signature Patient Self Care Activities:  Continue compliance with medication management Attend all scheduled provider appointments Call provider office for new concerns or questions Utilize strategies discussed to assist in management of symptoms         Christa See, MSW, [email or web address] Phone [phone] 10:08 AM

## 2021-02-12 ENCOUNTER — Ambulatory Visit: Payer: Medicare HMO | Admitting: Pharmacist

## 2021-02-12 DIAGNOSIS — E1169 Type 2 diabetes mellitus with other specified complication: Secondary | ICD-10-CM

## 2021-02-12 DIAGNOSIS — E785 Hyperlipidemia, unspecified: Secondary | ICD-10-CM

## 2021-02-12 DIAGNOSIS — Z794 Long term (current) use of insulin: Secondary | ICD-10-CM

## 2021-02-12 DIAGNOSIS — J449 Chronic obstructive pulmonary disease, unspecified: Secondary | ICD-10-CM

## 2021-02-12 DIAGNOSIS — F17200 Nicotine dependence, unspecified, uncomplicated: Secondary | ICD-10-CM

## 2021-02-12 NOTE — Patient Instructions (Signed)
Visit Information  Our goal A1c is less than 7%. This corresponds with fasting sugars less than 130 and 2 hour after meal sugars less than 180. Please check your blood sugar each morning and again before bed 2-3 nights per week.  Please bring blood sugar log and/or meter to every visit with Endocrinologist  Our goal bad cholesterol, or LDL, is less than 70 . This is why it is important to continue taking your atorvastatin  Feel free to call me with any questions or concerns. I look forward to our next call!   Wallace Cullens, PharmD, [place] [phone]  The patient verbalized understanding of instructions, educational materials, and care plan provided today and declined offer to receive copy of patient instructions, educational materials, and care plan.   Telephone follow up appointment with care management team member scheduled for: 02/28/2021 at 10:00 AM

## 2021-02-12 NOTE — Chronic Care Management (AMB) (Signed)
Chronic Care Management Pharmacy Note  02/12/2021 Name:  Brandi Calderon MRN:  [id number] DOB:  08-Jan-1966   Subjective: Brandi Calderon is an 55 y.o. year old female who is a primary patient of [name], NP.  The CCM team was consulted for assistance with disease management and care coordination needs.    Engaged with patient by telephone for follow up visit in response to provider referral for pharmacy case management and/or care coordination services.   Consent to Services:  The patient was given information about Chronic Care Management services, agreed to services, and gave verbal consent prior to initiation of services.  Please see initial visit note for detailed documentation.   Patient Care Team: [name], NP as PCP - General (Internal Medicine) Dr. Leonides Schanz (Psychiatry) Dr. Kenton Kingfisher (Gynecology) Tawni Millers, MD (Internal Medicine) Vanita Ingles, RN as Registered Nurse ([place]) Curley Spice, Virl Diamond, [place] as Pharmacist (Pharmacist) Rebekah Chesterfield, LCSW as Social Worker (Licensed Clinical Social Worker)   Objective:  Lab Results  Component Value Date   CREATININE 0.83 09/11/2020   CREATININE 0.81 09/07/2019   CREATININE 0.74 01/04/2019    Lab Results  Component Value Date   HGBA1C 7.7 (A) 09/07/2019   HGBA1C 7.7 09/07/2019   HGBA1C 7.7 (A) 09/07/2019   Last diabetic Eye exam:  Lab Results  Component Value Date/Time   HMDIABEYEEXA No Retinopathy 02/21/2020 12:00 AM    Last diabetic Foot exam: No results found for: HMDIABFOOTEX      Component Value Date/Time   CHOL 205 (H) 01/04/2019 0929   CHOL 120 12/24/2016 1827   TRIG 87 01/04/2019 0929   HDL 40 (L) 01/04/2019 0929   HDL 29 (L) 12/24/2016 1827   CHOLHDL 5.1 (H) 01/04/2019 0929   Rives 146 (H) 01/04/2019 0929    Hepatic Function Latest Ref Rng & Units 09/11/2020 09/07/2019 01/04/2019  Total Protein 6.5 - 8.1 g/dL 7.3 7.2 7.1  Albumin 3.5 - 5.0 g/dL 4.3 - -  AST 15 -  41 U/L 13(L) 10 11  ALT 0 - 44 U/L 11 9 8   Alk Phosphatase 38 - 126 U/L 87 - -  Total Bilirubin 0.3 - 1.2 mg/dL 0.3 0.4 0.3     Social History   Tobacco Use  Smoking Status Every Day   Packs/day: 1.50   Years: 39.00   Pack years: 58.50   Types: Cigarettes  Smokeless Tobacco Never  Tobacco Comments   2PPD 10/10/2020   BP Readings from Last 3 Encounters:  01/16/21 115/71  10/10/20 120/66  09/11/20 107/68   Pulse Readings from Last 3 Encounters:  01/16/21 73  10/10/20 89  09/11/20 72   Wt Readings from Last 3 Encounters:  01/16/21 131 lb 3.2 oz (59.5 kg)  01/15/21 131 lb (59.4 kg)  10/10/20 131 lb (59.4 kg)    Assessment: Review of patient past medical history, allergies, medications, health status, including review of consultants reports, laboratory and other test data, was performed as part of comprehensive evaluation and provision of chronic care management services.   SDOH:  (Social Determinants of Health) assessments and interventions performed: yes SDOH Interventions    Flowsheet Row Most Recent Value  SDOH Interventions   SDOH Interventions for the Following Domains Tobacco  Tobacco Interventions Other (Comment)  [Encourage smoking cessation]       CCM Care Plan  Allergies  Allergen Reactions   Penicillins Anaphylaxis    Has patient had a PCN reaction causing immediate  rash, facial/tongue/throat swelling, SOB or lightheadedness with hypotension: Yes Has patient had a PCN reaction causing severe rash involving mucus membranes or skin necrosis: No Has patient had a PCN reaction that required hospitalization: No Has patient had a PCN reaction occurring within the last 10 years: No If all of the above answers are "NO", then may proceed with Cephalosporin use.   Throat swells   Victoza [Liraglutide]     Severe nausea   Vicodin [Hydrocodone-Acetaminophen] Rash    And hives    Medications Reviewed Today     Reviewed by [name], RPH-CPP  (Pharmacist) on 02/12/21 at 1054  Med List Status: <None>   Medication Order Taking? Sig Documenting Provider Last Dose Status Informant  albuterol (VENTOLIN HFA) 108 (90 Base) MCG/ACT inhaler [id number] Yes INHALE 1-2 PUFFS BY MOUTH EVERY 6 HOURS AS NEEDED FOR WHEEZE OR SHORTNESS OF BREATH [name], [name], NP Taking Active   Aspirin-Salicylamide-Caffeine (BC HEADACHE POWDER PO) [id number]  Take 4 packets by mouth daily as needed (pain). [provider]  Active   atorvastatin (LIPITOR) 20 MG tablet [id number] Yes Take 1 tablet (20 mg total) by mouth daily at 6 PM. [name], NP Taking Active   Blood Glucose Monitoring Suppl [facility]) w/Device KIT [id number]  1 Device by Does not apply route daily. [name], NP  Active   buPROPion (WELLBUTRIN XL) 150 MG 24 hr tablet [id number]  Take 150 mg by mouth daily. [provider]  Active   clonazePAM (KLONOPIN) 0.5 MG tablet [id number]  Take 0.5 mg by mouth 2 (two) times daily as needed for anxiety (Sometimes takes 3 tablets a day).  [provider]  Active Self           Med Note [name], [place]   Thu Sep 05, 2016  9:54 AM)    DULoxetine (CYMBALTA) 60 MG capsule [id number]  Take 60 mg by mouth daily. [provider]  Active   fluticasone (FLONASE) 50 MCG/ACT nasal spray [id number]  SPRAY 2 SPRAYS INTO EACH NOSTRIL EVERY DAY [name], [name], NP  Active   Fluticasone-Umeclidin-Vilant (TRELEGY ELLIPTA) 100-62.5-25 MCG/INH AEPB [id number] Yes Inhale 1 puff into the lungs daily. [name], FNP Taking Active   furosemide (LASIX) 40 MG tablet [id number]  TAKE 1 TABLET BY MOUTH EVERY DAY [name], [name], NP  Active   gabapentin (NEURONTIN) 300 MG capsule [id number]  Take by mouth 3 (three) times daily as needed. [provider]  Active   glucose blood test strip [id number]  Use as instructed [name], NP  Active   JARDIANCE 25 MG TABS tablet [id number] Yes Take 25 mg by mouth daily. [provider] Taking Active   Lancets Glory Rosebush ULTRASOFT) lancets [id number]  Use as instructed [name], NP  Active   medroxyPROGESTERone (PROVERA) 10 MG tablet [id number]  Take 1 tablet (10 mg total) by mouth daily. [name], NP  Active   metFORMIN (GLUCOPHAGE) 1000 MG tablet [id number] Yes TAKE 1 TABLET (1,000 MG TOTAL) BY MOUTH 2 (TWO) TIMES DAILY WITH A MEAL. [name], NP Taking Active   OXYGEN [id number]  Inhale 1 L into the lungs at bedtime. [provider]  Active   pantoprazole (PROTONIX) 40 MG tablet [id number]  Take 1 tablet (40 mg total) by mouth daily. [name], NP  Active   TRULICITY 4.5 XT/0.2IO [place] [facility] [id number] Yes Inject 0.5 mLs (4.5 mg total) subcutaneously every 7 (seven) days [provider] Taking Active   Med List Note [name], RN 06/25/18 1610): UDS 06-25-2018            Patient Active Problem List   Diagnosis Date Noted   Snoring 08/30/2020   Neutrophilia 08/30/2020   Smoker 08/30/2020   Mixed simple and mucopurulent chronic bronchitis ([place]) 07/26/2020   Gastroesophageal reflux disease without esophagitis 07/26/2020   Hyperlipidemia associated with type 2 diabetes mellitus ([place]) 05/24/2020   Anxiety and depression 07/23/2018   Chronic pain syndrome 07/23/2018   Neuropathy due to type 2 diabetes mellitus ([place]) - severe 12/19/2015   Type 2 diabetes mellitus with other specified complication ([place]) 96/07/5407    Immunization History  Administered Date(s) Administered   Influenza Inj Mdck Quad Pf 02/07/2016   Influenza,inj,Quad PF,6+ Mos 01/04/2019   Influenza-Unspecified 02/18/2017   PFIZER Comirnaty([place])Covid-19 Tri-Sucrose Vaccine 11/10/2019    Conditions to be addressed/monitored: T2DM, Tobacco use, HLD  Care Plan : General Pharmacy (Adult)  Updates made by [name], RPH-CPP since 02/12/2021 12:00 AM     Problem: Disease Progression      Long-Range Goal: Disease Progression  Prevented or Minimized   Start Date: 03/06/2020  Expected End Date: 06/04/2020  This Visit's Progress: On track  Recent Progress: On track  Priority: High  Note:   Current Barriers:  Unable to self administer medications as prescribed Lack of blood sugar readings for clinical team  Pharmacist Clinical Goal(s):  Over the next 90 days, patient will achieve adherence to monitoring guidelines and medication adherence to achieve therapeutic efficacy. through collaboration with PharmD and provider.   Interventions: Inter-disciplinary care team collaboration (see longitudinal plan of care) 1:1 collaboration with [name], NP regarding development and update of comprehensive plan of care as evidenced by provider attestation and co-signature Perform chart review Patient seen for Office Visit with [facility] on 10/14  A1C: 7.8% Office Visit on 10/18 with PCP for Annual Wellness Visit  Type 2 Diabetes: Improved control based on latest A1C result from Endocrinologist; current treatment:  Metformin 1000 mg twice daily Trulicity 4.5 mg weekly on Tuesdays Jardiance 25 mg daily Denies checking home blood sugar in the past week Advise patient to restart checking blood sugars fasting each morning and before bed 2-3 nights/week as directed by Endocrinologist Remind patient to bring blood sugar monitor with her to medical appointments Reports working on changing her eating habits and limiting carbohydrate portion size. Reports has been eating more vegetables Exercise: reports has been using treadmill at the gym or step machine at home for ~5 minutes/day and also walking outside Again encourage patient to work on increasing the time that she exercises, starting with increasing to ~10 minutes/day Reports annual eye exam scheduled for 11/28  Tobacco use: Currently smoking: 2 packs/day Interested in quitting smoking and has set a quit date. Quit date: 04/01/2021 Reports family is her  motivation for quitting. States her daughters have told her that they would quit smoking with her Triggers: boredom, others smoking around her Strategy: playing games online to help with boredom Reports picked up Chantix starter pack as prescribed by Endocrinologist, but has not yet started the medication  Coordination of Care: Reports recently advised by mental health provider at [facility] that their clinic is no longer in-network with her insurance plan Reports provider willing to maintain refills of her mental health medications until can find a new provider States she will follow up with [facility] today to obtain a list of local providers in-network  Will collaborate with CCM team and PCP  Medication Adherence: Reports currently using weekly pillbox and denies missed doses Reports interested in switching presriptions to [place] for pill packaging, as pharmacy is close to her home, but has not followed up with pharmacy Patient reports interested in discussing further during out next visit Encourage patient to continue using weekly pillbox until starts pill packaging to aid with medication adherence   COPD: Current treatment: Trelegy - 1 puff daily Albuterol - 1-2 puffs every 6 hours as needed Overnight oxygen Have counseled on using Trelegy inhaler daily as directed, rinsing mouth after each use and using rescue (albuterol) inhaler as needed as directed Patient followed up with lung cancer screening program and completed Low Dose CT on 10/17  Hyperlipidemia: Current treatment: atorvastatin 20 mg daily Previous therapies tried: omega 3 (Lovaza) - unaffordable   Patient Goals/Self-Care Activities Over the next 90 days, patient will:  Focus on medication adherence by using weekly pillbox as adherence tool Check blood glucose in morning and at bedtime and bring meter to future appointments with Endocrinologist Attend medical appointments as scheduled  Follow Up Plan:  Telephone follow up appointment with care management team member scheduled for: 02/28/2021 at 10:00 AM     Patient's preferred pharmacy is:  [id number] [place] [place] [id number]: [phone]: [phone]  Follow Up:  Patient agrees to Care Plan and Follow-up.  EWallace Cullens PharmD, BPara March CPP Clinical Pharmacist [phone]

## 2021-02-13 ENCOUNTER — Ambulatory Visit: Payer: Medicare HMO | Admitting: Licensed Clinical Social Worker

## 2021-02-13 DIAGNOSIS — F419 Anxiety disorder, unspecified: Secondary | ICD-10-CM

## 2021-02-13 DIAGNOSIS — E1140 Type 2 diabetes mellitus with diabetic neuropathy, unspecified: Secondary | ICD-10-CM

## 2021-02-13 DIAGNOSIS — E785 Hyperlipidemia, unspecified: Secondary | ICD-10-CM

## 2021-02-13 DIAGNOSIS — Z794 Long term (current) use of insulin: Secondary | ICD-10-CM

## 2021-02-13 DIAGNOSIS — G894 Chronic pain syndrome: Secondary | ICD-10-CM

## 2021-02-15 ENCOUNTER — Other Ambulatory Visit: Payer: Self-pay | Admitting: Internal Medicine

## 2021-02-15 MED ORDER — TRULICITY 4.5 MG/0.5ML ~~LOC~~ SOAJ: SUBCUTANEOUS | 1 refills | Status: DC

## 2021-02-15 NOTE — Chronic Care Management (AMB) (Signed)
Chronic Care Management    Clinical Social Work Note  02/15/2021 Name: Brandi Calderon MRN: [id number] DOB: July 25, 1965  Brandi Calderon is a 55 y.o. year old female who is a primary care patient of [name], NP. The CCM team was consulted to assist the patient with chronic disease management and/or care coordination needs related to: Mental Health Counseling and Resources.   Engaged with patient by telephone for follow up visit in response to provider referral for social work chronic care management and care coordination services.   Consent to Services:  The patient was given information about Chronic Care Management services, agreed to services, and gave verbal consent prior to initiation of services.  Please see initial visit note for detailed documentation.   Patient agreed to services and consent obtained.   Consent to Services:  The patient was given information about Care Management services, agreed to services, and gave verbal consent prior to initiation of services.  Please see initial visit note for detailed documentation.   Patient agreed to services today and consent obtained.  Engaged with patient by phone in response to provider referral for social work care coordination services:  Assessment/Interventions:  Patient continues to maintain positive progress with care plan goals. She is grieving the loss of father and the ending of an relationship resulting in current 36B. Strategies to assist with coping during holidays discussed, in addition, to stress management. Patient will contact insurance to obtain in-network psychiatrists. Patient requests re-fill for Trelegy noting the initial prescription expired 01/23/2021. See Care Plan below for interventions and patient self-care activities.  Recent life changes or stressors: Grief, medication management  Recommendation: Patient may benefit from, and is in agreement work with LCSW to address care coordination needs and  will continue to work with the clinical team to address health care and disease management related needs.   Follow up Plan: Patient would like continued follow-up from CCM LCSW .  per patient's request will follow up in 03/13/21.  Will call office if needed prior to next encounter.    SDOH (Social Determinants of Health) assessments and interventions performed:    Advanced Directives Status: Not addressed in this encounter.  CCM Care Plan  Allergies  Allergen Reactions   Penicillins Anaphylaxis    Has patient had a PCN reaction causing immediate rash, facial/tongue/throat swelling, SOB or lightheadedness with hypotension: Yes Has patient had a PCN reaction causing severe rash involving mucus membranes or skin necrosis: No Has patient had a PCN reaction that required hospitalization: No Has patient had a PCN reaction occurring within the last 10 years: No If all of the above answers are "NO", then may proceed with Cephalosporin use.   Throat swells   Victoza [Liraglutide]     Severe nausea   Vicodin [Hydrocodone-Acetaminophen] Rash    And hives    Outpatient Encounter Medications as of 02/13/2021  Medication Sig   albuterol (VENTOLIN HFA) 108 (90 Base) MCG/ACT inhaler INHALE 1-2 PUFFS BY MOUTH EVERY 6 HOURS AS NEEDED FOR WHEEZE OR SHORTNESS OF BREATH   Aspirin-Salicylamide-Caffeine (BC HEADACHE POWDER PO) Take 4 packets by mouth daily as needed (pain).   atorvastatin (LIPITOR) 20 MG tablet Take 1 tablet (20 mg total) by mouth daily at 6 PM.   Blood Glucose Monitoring Suppl (ONETOUCH ULTRALINK) w/Device KIT 1 Device by Does not apply route daily.   buPROPion (WELLBUTRIN XL) 150 MG 24 hr tablet Take 150 mg by mouth daily.   clonazePAM (KLONOPIN) 0.5 MG tablet Take  0.5 mg by mouth 2 (two) times daily as needed for anxiety (Sometimes takes 3 tablets a day).    DULoxetine (CYMBALTA) 60 MG capsule Take 60 mg by mouth daily.   fluticasone (FLONASE) 50 MCG/ACT nasal spray SPRAY 2 SPRAYS  INTO EACH NOSTRIL EVERY DAY   Fluticasone-Umeclidin-Vilant (TRELEGY ELLIPTA) 100-62.5-25 MCG/INH AEPB Inhale 1 puff into the lungs daily.   furosemide (LASIX) 40 MG tablet TAKE 1 TABLET BY MOUTH EVERY DAY   gabapentin (NEURONTIN) 300 MG capsule Take by mouth 3 (three) times daily as needed.   glucose blood test strip Use as instructed   JARDIANCE 25 MG TABS tablet Take 25 mg by mouth daily.   Lancets (ONETOUCH ULTRASOFT) lancets Use as instructed   medroxyPROGESTERone (PROVERA) 10 MG tablet Take 1 tablet (10 mg total) by mouth daily.   metFORMIN (GLUCOPHAGE) 1000 MG tablet TAKE 1 TABLET (1,000 MG TOTAL) BY MOUTH 2 (TWO) TIMES DAILY WITH A MEAL.   OXYGEN Inhale 1 L into the lungs at bedtime.   pantoprazole (PROTONIX) 40 MG tablet Take 1 tablet (40 mg total) by mouth daily.   TRULICITY 4.5 RA/0.7MA SOPN Inject 0.5 mLs (4.5 mg total) subcutaneously every 7 (seven) days   No facility-administered encounter medications on file as of 02/13/2021.    Patient Active Problem List   Diagnosis Date Noted   Snoring 08/30/2020   Neutrophilia 08/30/2020   Smoker 08/30/2020   Mixed simple and mucopurulent chronic bronchitis ([place]) 07/26/2020   Gastroesophageal reflux disease without esophagitis 07/26/2020   Hyperlipidemia associated with type 2 diabetes mellitus ([place]) 05/24/2020   Anxiety and depression 07/23/2018   Chronic pain syndrome 07/23/2018   Neuropathy due to type 2 diabetes mellitus ([place]) - severe 12/19/2015   Type 2 diabetes mellitus with other specified complication ([place]) 26/33/3545    Conditions to be addressed/monitored: Anxiety, Depression, and Chronic Pain Disorder  Care Plan : General Social Work (Adult)  Updates made by Rebekah Chesterfield, LCSW since 02/15/2021 12:00 AM     Problem: Response to Treatment (Depression)      Long-Range Goal: Response to Treatment Maximized   Start Date: 05/23/2020  This Visit's Progress: On track  Recent Progress: On track  Priority: Medium   Note:   Timeframe:  Long-Range Goal Priority:  Medium  Start Date:  05/23/20                         Expected End Date:  03/31/21                  Follow Up Date- 03/13/21  Current Barriers:  Chronic Mental Health needs related to Depression, Anxiety and Grief Limited social support Mental Health Concerns  Social Isolation Loss of child in 2016 Suicidal Ideation/Homicidal Ideation: No Clinical Social Work Goal(s):  Over the next 120 days, patient will work with SW to address concerns related to care coordination needs and lack of education/support/resource connection. LCSW will assist patient in gaining community resource education and additional support and resource connection as well in order to maintain health and mental health appropriately  Over the next 120 days, patient will demonstrate improved adherence to self care as evidenced by implementing healthy self-care into her daily routine such as: attending all medical appointments, deep breathing exercises, taking time for self-reflection, taking medications as prescribed, drinking water and daily exercise to improve mobility and mood.  Over the next 120 days, patient will work with SW bi-monthly by telephone or in  person to reduce or manage symptoms related to stress, anxiety and grief. Over the next 120 days, patient will demonstrate improved health management independence as evidenced by implementing healthy self-care skills and positive support/resources into her daily routine to help cope with stressors and improve overall health and well-being  Over the next 120 days, patient or caregiver will verbalize basic understanding of depression/stress process and self health management plan as evidenced by her participation in development of long term plan of care and institution of self health management strategies Interventions: Patient interviewed and appropriate assessments performed: brief mental health assessment Patient reports  symptoms of depression and anxiety are managed well currently. Triggers include chronic medical conditions and declining health of father (just initiated hospice) 11/08: Patient recently went to court due to a 50B against ex boyfriend. It was continued 11/15: Patient reports increase in stress triggered by father's death a couple of months ago and current case where ex is suing her from $2,000  Patient continues participate in medication management with psychiatrist, Dr. List, through [facility] every two months. Patient states medications play a large role in management of symptoms. She was recently referred to therapy by Dr. List [facility] and is expecting a call to schedule initial appointment 11/15: Patient does not receive services through [facility], as they no longer accept her insurance. CCM LCSW informed patient of local agencies that provide medication management. Patient agreed to contact her insurance and obtain psychiatrists in-network Per patient, her lungs work at 50% capacity. She plans to schedule lung screening in the near future. Patient utilizes two inhalers prescribed by specialist to assist with SOB. Patient shared this occurs when she over exerts self  Patient reports that use of Gabapentin is effective in pain management. She reports [facility] with [facility] CCM LCSW provided validation and encouragement. LCSW strongly encouraged patient to contact provider [facility]. Patient agreed to do so and has the provider's contact information 11/15: Patient requests re-fill for Trelegy noting the initial prescription expired 01/23/2021. CCM LCSW will collaborate with PCP and CCM Pharmacy Pt identified difficulty coping with change. CCM LCSW provided validation and encouragement. Strategies to assist with coping identified Patient has been walking at the gym and utilizes a stepper at home. She is trying to quit smoking; however, identified greater  odds of success by decreasing use a little at a time Strategies to promote self-care and stress management to assist in management of physical and mental health conditions discussed. Emotional support was provided Patient reports strong support from two adult daughters who reside locally. Daughter will accompany patient to medical appointments Solution-Focused Strategies, Mindfulness or Relaxation Training, Active listening / Reflection utilized , Reviewed mental health medications with patient and discussed compliance: , and Verbalization of feelings encouraged  Discussed plans with patient for ongoing care management follow up and provided patient with direct contact information for care management team Advised patient to contact CCM LCSW for any urgent social work related issues 1:1 collaboration with primary care provider regarding development and update of comprehensive plan of care as evidenced by provider attestation and co-signature Patient Self Care Activities:  Continue compliance with medication management Attend all scheduled provider appointments Call provider office for new concerns or questions Utilize strategies discussed to assist in management of symptoms         Christa See, MSW, [email or web address] Phone [phone]  9:51 AM

## 2021-02-15 NOTE — Patient Instructions (Signed)
Visit Information  Patient Self Care Activities:  Continue compliance with medication management Attend all scheduled provider appointments Call provider office for new concerns or questions Utilize strategies discussed to assist in management of symptoms  Patient verbalizes understanding of instructions provided today and agrees to view in [place].   Telephone follow up appointment with care management team member scheduled for:03/13/21  Christa See, MSW, [email or web address] Phone [phone] 9:53 AM

## 2021-02-19 ENCOUNTER — Telehealth: Payer: Self-pay | Admitting: Pharmacist

## 2021-02-19 DIAGNOSIS — J449 Chronic obstructive pulmonary disease, unspecified: Secondary | ICD-10-CM

## 2021-02-19 NOTE — Telephone Encounter (Signed)
Received message from [facility] Education officer, museum. Patient requesting renewal of Rx for Trelegy inhaler as current Rx expired. Would you please sent to pharmacy for patient?  Thank you,,  Wallace Cullens, PharmD, Para [place], CPP Clinical Pharmacist [facility] [phone]

## 2021-02-20 MED ORDER — TRELEGY ELLIPTA 100-62.5-25 MCG/ACT IN AEPB: 1.0000 | INHALATION_SPRAY | Freq: Every day | RESPIRATORY_TRACT | 11 refills | Status: DC

## 2021-02-20 NOTE — Telephone Encounter (Signed)
It looks like this was filled by Dr. Parks Ranger earlier today.

## 2021-02-22 ENCOUNTER — Other Ambulatory Visit: Payer: Self-pay | Admitting: Internal Medicine

## 2021-02-22 DIAGNOSIS — J449 Chronic obstructive pulmonary disease, unspecified: Secondary | ICD-10-CM | POA: Diagnosis not present

## 2021-02-23 NOTE — Telephone Encounter (Signed)
Requested Prescriptions  Pending Prescriptions Disp Refills  . fluticasone (FLONASE) 50 MCG/ACT nasal spray [Pharmacy Med Name: FLUTICASONE PROP 50 MCG SPRAY] 16 mL 0    Sig: SPRAY 2 SPRAYS INTO EACH NOSTRIL EVERY DAY     Ear, Nose, and Throat: Nasal Preparations - Corticosteroids Passed - 02/22/2021 10:30 AM      Passed - Valid encounter within last 12 months    Recent Outpatient Visits          1 month ago Medicare annual wellness visit, subsequent   [facility] [place], [place] W, NP   7 months ago Type 2 diabetes mellitus with complication, with long-term current use of insulin [facility])   [facility] [place], [name], NP   8 months ago Fatigue, unspecified type   [place], NP   10 months ago Urinary frequency   [facility], [name], FNP   1 year ago [facility] [place], [name], FNP      Future Appointments            In 4 months [name], [name], NP [facility], [facility]

## 2021-02-26 DIAGNOSIS — H524 Presbyopia: Secondary | ICD-10-CM | POA: Diagnosis not present

## 2021-02-26 DIAGNOSIS — H5213 Myopia, bilateral: Secondary | ICD-10-CM | POA: Diagnosis not present

## 2021-02-26 DIAGNOSIS — Z7985 Long-term (current) use of injectable non-insulin antidiabetic drugs: Secondary | ICD-10-CM | POA: Diagnosis not present

## 2021-02-26 DIAGNOSIS — H52203 Unspecified astigmatism, bilateral: Secondary | ICD-10-CM | POA: Diagnosis not present

## 2021-02-26 DIAGNOSIS — Z7984 Long term (current) use of oral hypoglycemic drugs: Secondary | ICD-10-CM | POA: Diagnosis not present

## 2021-02-26 DIAGNOSIS — H2513 Age-related nuclear cataract, bilateral: Secondary | ICD-10-CM | POA: Diagnosis not present

## 2021-02-26 DIAGNOSIS — E1136 Type 2 diabetes mellitus with diabetic cataract: Secondary | ICD-10-CM | POA: Diagnosis not present

## 2021-02-26 LAB — HM DIABETES EYE EXAM

## 2021-02-28 ENCOUNTER — Telehealth: Payer: Medicare HMO

## 2021-02-28 ENCOUNTER — Telehealth: Payer: Self-pay | Admitting: Pharmacist

## 2021-02-28 DIAGNOSIS — F17210 Nicotine dependence, cigarettes, uncomplicated: Secondary | ICD-10-CM

## 2021-02-28 DIAGNOSIS — E1169 Type 2 diabetes mellitus with other specified complication: Secondary | ICD-10-CM | POA: Diagnosis not present

## 2021-02-28 DIAGNOSIS — Z794 Long term (current) use of insulin: Secondary | ICD-10-CM | POA: Diagnosis not present

## 2021-02-28 DIAGNOSIS — E785 Hyperlipidemia, unspecified: Secondary | ICD-10-CM | POA: Diagnosis not present

## 2021-02-28 DIAGNOSIS — F419 Anxiety disorder, unspecified: Secondary | ICD-10-CM

## 2021-02-28 DIAGNOSIS — E1140 Type 2 diabetes mellitus with diabetic neuropathy, unspecified: Secondary | ICD-10-CM | POA: Diagnosis not present

## 2021-02-28 DIAGNOSIS — F32A Depression, unspecified: Secondary | ICD-10-CM | POA: Diagnosis not present

## 2021-02-28 DIAGNOSIS — J449 Chronic obstructive pulmonary disease, unspecified: Secondary | ICD-10-CM

## 2021-02-28 DIAGNOSIS — Z7984 Long term (current) use of oral hypoglycemic drugs: Secondary | ICD-10-CM

## 2021-02-28 DIAGNOSIS — R69 Illness, unspecified: Secondary | ICD-10-CM | POA: Diagnosis not present

## 2021-02-28 NOTE — Telephone Encounter (Signed)
  Chronic Care Management   Outreach Note  02/28/2021 Name: Brandi Calderon MRN: [id number] DOB: [birth year]  Referred by: [name], NP Reason for referral : No chief complaint on file.   Was unable to reach patient via telephone today x 2 and have left HIPAA compliant voicemail asking patient to return my call.    Follow Up Plan: Will collaborate with Care Guide to outreach to schedule follow up with me  Wallace Cullens, PharmD, [place] Management [phone]

## 2021-03-02 ENCOUNTER — Telehealth: Payer: Self-pay

## 2021-03-02 NOTE — Chronic Care Management (AMB) (Signed)
  Care Management   Note  03/02/2021 Name: Brandi Calderon MRN: [id number] DOB: 10/28/1965  Brandi Calderon is a 55 y.o. year old female who is a primary care patient of [name], NP and is actively engaged with the care management team. I reached out to [name] by phone today to assist with re-scheduling a follow up visit with the Pharmacist  Follow up plan: Unsuccessful telephone outreach attempt made. A HIPAA compliant phone message was left for the patient providing contact information and requesting a return call.  The care management team will reach out to the patient again over the next 7 days.  If patient returns call to provider office, please advise to call [place]  at [place], [place], [place] Management  [place], [place] [address] Direct Dial: [phone] [email or web address] Website: [email or web address]

## 2021-03-05 ENCOUNTER — Telehealth: Payer: Self-pay

## 2021-03-05 ENCOUNTER — Telehealth: Payer: Medicare HMO

## 2021-03-05 NOTE — Chronic Care Management (AMB) (Signed)
  Care Management   Note  03/05/2021 Name: Stephania Macfarlane MRN: [id number] DOB: Sep 19, 1965  Brandi Calderon is a 55 y.o. year old female who is a primary care patient of Garnette Gunner, [name], NP and is actively engaged with the care management team. I reached out to [name] by phone today to assist with re-scheduling a follow up visit with the Pharmacist  Follow up plan: Telephone appointment with care management team member scheduled for:04/18/2020  Noreene Larsson, [place], [place], [place] [address] Direct Dial: [phone] [email or web address] Website: [email or web address]

## 2021-03-05 NOTE — Telephone Encounter (Signed)
  Care Management   Follow Up Note   03/05/2021 Name: Norvella Loscalzo MRN: [id number] DOB: [birth year]   Referred by: [name], NP Reason for referral : Chronic Care Management (RNCM: Follow up for Chronic Disease Management and Care Coordination Needs )   An unsuccessful telephone outreach was attempted today. The patient was referred to the case management team for assistance with care management and care coordination.   Follow Up Plan: A HIPPA compliant phone message was left for the patient providing contact information and requesting a return call.   Noreene Larsson RN, MSN, [facility] Mobile: [phone]

## 2021-03-13 ENCOUNTER — Telehealth: Payer: Medicare HMO

## 2021-03-14 ENCOUNTER — Telehealth: Payer: Self-pay | Admitting: Licensed Clinical Social Worker

## 2021-03-14 ENCOUNTER — Other Ambulatory Visit: Payer: Self-pay | Admitting: Internal Medicine

## 2021-03-14 NOTE — Telephone Encounter (Signed)
° °   Clinical Social Work  Care Management   Phone Outreach    03/14/2021 Name: [name] MRN: [id number] DOB: [birth year]  Brandi Calderon is a 55 y.o. year old female who is a primary care patient of [name], NP .   Reason for referral: Mental Health Counseling and Resources.    F/U phone call today to assess needs, progress and barriers with care plan goals.   Telephone outreach was unsuccessful. A HIPPA compliant phone message was left for the patient providing contact information and requesting a return call.   Plan:CCM LCSW will wait for return call. If no return call is received, CCM LCSW will make another attempt within 30 days.  Review of patient status, including review of consultants reports, relevant laboratory and other test results, and collaboration with appropriate care team members and the patient's provider was performed as part of comprehensive patient evaluation and provision of care management services.    Christa See, MSW, [place] Care Management [email or web address] Phone [phone] 10:36 AM

## 2021-03-14 NOTE — Telephone Encounter (Signed)
Requested Prescriptions  Pending Prescriptions Disp Refills   fluticasone (FLONASE) 50 MCG/ACT nasal spray [Pharmacy Med Name: FLUTICASONE PROP 50 MCG SPRAY] 16 mL 0    Sig: SPRAY 2 SPRAYS INTO EACH NOSTRIL EVERY DAY     Ear, Nose, and Throat: Nasal Preparations - Corticosteroids Passed - 03/14/2021  1:36 AM      Passed - Valid encounter within last 12 months    Recent Outpatient Visits          1 month ago Medicare annual wellness visit, subsequent   [facility] [place], [place], NP   7 months ago Type 2 diabetes mellitus with complication, with long-term current use of insulin [facility])   [facility] [place], [name], NP   9 months ago Fatigue, unspecified type   [place], NP   11 months ago Urinary frequency   [facility], [name], FNP   1 year ago [facility] [place], [name], FNP      Future Appointments            In 4 months [name], [name], NP [facility], [facility]

## 2021-03-24 DIAGNOSIS — J449 Chronic obstructive pulmonary disease, unspecified: Secondary | ICD-10-CM | POA: Diagnosis not present

## 2021-04-02 DIAGNOSIS — K219 Gastro-esophageal reflux disease without esophagitis: Secondary | ICD-10-CM | POA: Diagnosis not present

## 2021-04-02 DIAGNOSIS — E663 Overweight: Secondary | ICD-10-CM | POA: Diagnosis not present

## 2021-04-02 DIAGNOSIS — J449 Chronic obstructive pulmonary disease, unspecified: Secondary | ICD-10-CM | POA: Diagnosis not present

## 2021-04-02 DIAGNOSIS — R609 Edema, unspecified: Secondary | ICD-10-CM | POA: Diagnosis not present

## 2021-04-02 DIAGNOSIS — E1142 Type 2 diabetes mellitus with diabetic polyneuropathy: Secondary | ICD-10-CM | POA: Diagnosis not present

## 2021-04-02 DIAGNOSIS — R69 Illness, unspecified: Secondary | ICD-10-CM | POA: Diagnosis not present

## 2021-04-02 DIAGNOSIS — E785 Hyperlipidemia, unspecified: Secondary | ICD-10-CM | POA: Diagnosis not present

## 2021-04-02 DIAGNOSIS — J302 Other seasonal allergic rhinitis: Secondary | ICD-10-CM | POA: Diagnosis not present

## 2021-04-02 DIAGNOSIS — R030 Elevated blood-pressure reading, without diagnosis of hypertension: Secondary | ICD-10-CM | POA: Diagnosis not present

## 2021-04-03 ENCOUNTER — Ambulatory Visit (INDEPENDENT_AMBULATORY_CARE_PROVIDER_SITE_OTHER): Payer: Medicare HMO | Admitting: Licensed Clinical Social Worker

## 2021-04-03 DIAGNOSIS — E1169 Type 2 diabetes mellitus with other specified complication: Secondary | ICD-10-CM

## 2021-04-03 DIAGNOSIS — F32A Depression, unspecified: Secondary | ICD-10-CM

## 2021-04-03 DIAGNOSIS — Z794 Long term (current) use of insulin: Secondary | ICD-10-CM

## 2021-04-03 DIAGNOSIS — G894 Chronic pain syndrome: Secondary | ICD-10-CM

## 2021-04-04 NOTE — Chronic Care Management (AMB) (Signed)
Chronic Care Management    Clinical Social Work Note  04/04/2021 Name: Adrien Dietzman MRN: [id number] DOB: 1966/01/18  Sheronda Parran is a 56 y.o. year old female who is a primary care patient of [name], NP. The CCM team was consulted to assist the patient with chronic disease management and/or care coordination needs related to: Mental Health Counseling and Resources.   Engaged with patient by telephone for follow up visit in response to provider referral for social work chronic care management and care coordination services.   Consent to Services:  The patient was given information about Chronic Care Management services, agreed to services, and gave verbal consent prior to initiation of services.  Please see initial visit note for detailed documentation.   Patient agreed to services and consent obtained.   Consent to Services:  The patient was given information about Care Management services, agreed to services, and gave verbal consent prior to initiation of services.  Please see initial visit note for detailed documentation.   Patient agreed to services today and consent obtained.  Engaged with patient by phone in response to provider referral for social work care coordination services:  Assessment/Interventions:  Patient continues to maintain positive progress with care plan goals. She reports management of symptoms and stressors. Patient reports that she would like to meet with PCP prior to a referral to psychiatrist. Her behavioral health medications have refills currently. CCM LCSW collaborated with [place] to assist pt with scheduling f/up appt with PCP.  See Care Plan below for interventions and patient self-care activities.  Recent life changes or stressors: Management of health conditions  Recommendation: Patient may benefit from, and is in agreement work with LCSW to address care coordination needs and will continue to work with the clinical team to address  health care and disease management related needs.  Follow up Plan: Patient would like continued follow-up from CCM LCSW.  per patient's request will follow up in 07/03/21.  Will call office if needed prior to next encounter.   SDOH (Social Determinants of Health) assessments and interventions performed:    Advanced Directives Status: Not addressed in this encounter.  CCM Care Plan  Allergies  Allergen Reactions   Penicillins Anaphylaxis    Has patient had a PCN reaction causing immediate rash, facial/tongue/throat swelling, SOB or lightheadedness with hypotension: Yes Has patient had a PCN reaction causing severe rash involving mucus membranes or skin necrosis: No Has patient had a PCN reaction that required hospitalization: No Has patient had a PCN reaction occurring within the last 10 years: No If all of the above answers are "NO", then may proceed with Cephalosporin use.   Throat swells   Victoza [Liraglutide]     Severe nausea   Vicodin [Hydrocodone-Acetaminophen] Rash    And hives    Outpatient Encounter Medications as of 04/03/2021  Medication Sig   albuterol (VENTOLIN HFA) 108 (90 Base) MCG/ACT inhaler INHALE 1-2 PUFFS BY MOUTH EVERY 6 HOURS AS NEEDED FOR WHEEZE OR SHORTNESS OF BREATH   Aspirin-Salicylamide-Caffeine (BC HEADACHE POWDER PO) Take 4 packets by mouth daily as needed (pain).   atorvastatin (LIPITOR) 20 MG tablet Take 1 tablet (20 mg total) by mouth daily at 6 PM.   Blood Glucose Monitoring Suppl (ONETOUCH ULTRALINK) w/Device KIT 1 Device by Does not apply route daily.   buPROPion (WELLBUTRIN XL) 150 MG 24 hr tablet Take 150 mg by mouth daily.   clonazePAM (KLONOPIN) 0.5 MG tablet Take 0.5 mg by mouth 2 (  two) times daily as needed for anxiety (Sometimes takes 3 tablets a day).    DULoxetine (CYMBALTA) 60 MG capsule Take 60 mg by mouth daily.   fluticasone (FLONASE) 50 MCG/ACT nasal spray SPRAY 2 SPRAYS INTO EACH NOSTRIL EVERY DAY   furosemide (LASIX) 40 MG tablet  TAKE 1 TABLET BY MOUTH EVERY DAY   gabapentin (NEURONTIN) 300 MG capsule Take by mouth 3 (three) times daily as needed.   glucose blood test strip Use as instructed   JARDIANCE 25 MG TABS tablet Take 25 mg by mouth daily.   Lancets (ONETOUCH ULTRASOFT) lancets Use as instructed   medroxyPROGESTERone (PROVERA) 10 MG tablet Take 1 tablet (10 mg total) by mouth daily.   metFORMIN (GLUCOPHAGE) 1000 MG tablet TAKE 1 TABLET (1,000 MG TOTAL) BY MOUTH 2 (TWO) TIMES DAILY WITH A MEAL.   OXYGEN Inhale 1 L into the lungs at bedtime.   pantoprazole (PROTONIX) 40 MG tablet Take 1 tablet (40 mg total) by mouth daily.   TRELEGY ELLIPTA 100-62.5-25 MCG/ACT AEPB Inhale 1 puff into the lungs daily.   TRULICITY 4.5 ES/9.2ZR SOPN Inject 0.5 mLs (4.5 mg total) subcutaneously every 7 (seven) daysInject 0.5 mLs (4.5 mg total) subcutaneously every 7 (seven) days   No facility-administered encounter medications on file as of 04/03/2021.    Patient Active Problem List   Diagnosis Date Noted   Snoring 08/30/2020   Neutrophilia 08/30/2020   Smoker 08/30/2020   Mixed simple and mucopurulent chronic bronchitis ([place]) 07/26/2020   Gastroesophageal reflux disease without esophagitis 07/26/2020   Hyperlipidemia associated with type 2 diabetes mellitus ([place]) 05/24/2020   Anxiety and depression 07/23/2018   Chronic pain syndrome 07/23/2018   Neuropathy due to type 2 diabetes mellitus ([place]) - severe 12/19/2015   Type 2 diabetes mellitus with other specified complication ([place]) 00/76/2263    Conditions to be addressed/monitored: DMII, Anxiety, Depression, and Chronic Pain Syndrome  Care Plan : General Social Work (Adult)  Updates made by Christa See D, LCSW since 04/04/2021 12:00 AM     Problem: Response to Treatment (Depression)      Long-Range Goal: Response to Treatment Maximized   Start Date: 05/23/2020  This Visit's Progress: On track  Recent Progress: On track  Priority: Medium  Note:   Current Barriers:   Chronic Mental Health needs related to Depression, Anxiety and Grief Limited social support Mental Health Concerns  Social Isolation Loss of child in 2016 Suicidal Ideation/Homicidal Ideation: No Clinical Social Work Goal(s):  Over the next 120 days, patient will work with SW to address concerns related to care coordination needs and lack of education/support/resource connection. LCSW will assist patient in gaining community resource education and additional support and resource connection as well in order to maintain health and mental health appropriately  Over the next 120 days, patient will demonstrate improved adherence to self care as evidenced by implementing healthy self-care into her daily routine such as: attending all medical appointments, deep breathing exercises, taking time for self-reflection, taking medications as prescribed, drinking water and daily exercise to improve mobility and mood.  Over the next 120 days, patient will work with SW bi-monthly by telephone or in person to reduce or manage symptoms related to stress, anxiety and grief. Over the next 120 days, patient will demonstrate improved health management independence as evidenced by implementing healthy self-care skills and positive support/resources into her daily routine to help cope with stressors and improve overall health and well-being  Over the next 120 days, patient or caregiver  will verbalize basic understanding of depression/stress process and self health management plan as evidenced by her participation in development of long term plan of care and institution of self health management strategies Interventions: Patient interviewed and appropriate assessments performed: brief mental health assessment Patient reports symptoms of depression and anxiety are managed well currently. Triggers include chronic medical conditions and declining health of father (just initiated hospice) 11/08: Patient recently went to court  due to a 50B against ex boyfriend. It was continued 11/15: Patient reports increase in stress triggered by father's death a couple of months ago and current case where ex is suing her from $2,000  Patient continues participate in medication management with psychiatrist, Dr. List, through [facility] every two months. Patient states medications play a large role in management of symptoms. She was recently referred to therapy by Dr. List within [facility] and is expecting a call to schedule initial appointment 11/15: Patient does not receive services through [facility], as they no longer accept her insurance. CCM LCSW informed patient of local agencies that provide medication management. Patient agreed to contact her insurance and obtain psychiatrists in-network 1/3: Patient reports that she would like to meet with PCP prior to a referral to psychiatrist. Her behavioral health medications have refills currently Per patient, her lungs work at 50% capacity. She plans to schedule lung screening in the near future. Patient utilizes two inhalers prescribed by specialist to assist with SOB. Patient shared this occurs when she over exerts self  Patient reports that use of Gabapentin is effective in pain management. She reports frustration with obtaining her prescription for [facility] mg with [facility]. CCM LCSW provided validation and encouragement. LCSW strongly encouraged patient to contact provider to re-send [facility] to fill. Patient agreed to do so and has the provider's contact information 11/15: Patient requests re-fill for Trelegy noting the initial prescription expired 01/23/2021. CCM LCSW will collaborate with PCP and CCM Pharmacy Patient reports a nurse completed a home visit yesterday to check her weight, blood pressure, and oxygen. Reports no concerns CCM LCSW will collaborate with [facility] to assist with scheduling an appt with PCP Pt identified difficulty coping with change. CCM LCSW provided validation and encouragement.  Strategies to assist with coping identified Patient has been walking at the gym and utilizes a stepper at home. She is trying to quit smoking; however, identified greater odds of success by decreasing use a little at a time Strategies to promote self-care and stress management to assist in management of physical and mental health conditions discussed. Emotional support was provided Patient reports strong support from two adult daughters who reside locally. Daughter will accompany patient to medical appointments Solution-Focused Strategies, Mindfulness or Relaxation Training, Active listening / Reflection utilized , Reviewed mental health medications with patient and discussed compliance: , and Verbalization of feelings encouraged  Discussed plans with patient for ongoing care management follow up and provided patient with direct contact information for care management team Advised patient to contact CCM LCSW for any urgent social work related issues 1:1 collaboration with primary care provider regarding development and update of comprehensive plan of care as evidenced by provider attestation and co-signature Patient Self Care Activities:  Continue compliance with medication management Attend all scheduled provider appointments Call provider office for new concerns or questions Utilize strategies discussed to assist in management of symptoms       Christa See, MSW, [email or web address] Phone [phone]  9:30 AM

## 2021-04-04 NOTE — Patient Instructions (Signed)
Visit Information  Thank you for taking time to visit with me today. Please don't hesitate to contact me if I can be of assistance to you before our next scheduled telephone appointment.  Following are the goals we discussed today:  Patient Self Care Activities:  Continue compliance with medication management Attend all scheduled provider appointments Call provider office for new concerns or questions Utilize strategies discussed to assist in management of symptoms  Our next appointment is by telephone on 07/03/21 at 10:00 AM  Please call the care guide team at [phone] if you need to cancel or reschedule your appointment.   If you are experiencing a Mental Health or [place] or need someone to talk to, please call the Suicide and Crisis Lifeline: 988 call 911   Patient verbalizes understanding of instructions provided today and agrees to view in [place].   Christa See, MSW, [place] [facility] Care Management [email or web address] Phone [phone] 9:33 AM

## 2021-04-06 ENCOUNTER — Ambulatory Visit: Payer: Medicare HMO | Admitting: Internal Medicine

## 2021-04-06 NOTE — Progress Notes (Deleted)
Subjective:    Patient ID: Brandi Calderon, female    DOB: Apr 27, 1965, 56 y.o.   MRN: [id number]  HPI  Pt presents to the clinic today for follow up of anxiety and depression. This is currently managed on Duloxetine and Clonazepam.  Review of Systems     Past Medical History:  Diagnosis Date   Anxiety    Arthritis    Asthma    Back pain    COPD (chronic obstructive pulmonary disease) (HCC)    Depression    Diabetes mellitus    Headache    History of COVID-19    Hyperlipidemia    Hypertension    Tendonitis     Current Outpatient Medications  Medication Sig Dispense Refill   albuterol (VENTOLIN HFA) 108 (90 Base) MCG/ACT inhaler INHALE 1-2 PUFFS BY MOUTH EVERY 6 HOURS AS NEEDED FOR WHEEZE OR SHORTNESS OF BREATH 6.7 each 1   Aspirin-Salicylamide-Caffeine (BC HEADACHE POWDER PO) Take 4 packets by mouth daily as needed (pain).     atorvastatin (LIPITOR) 20 MG tablet Take 1 tablet (20 mg total) by mouth daily at 6 PM. 90 tablet 0   Blood Glucose Monitoring Suppl (ONETOUCH ULTRALINK) w/Device KIT 1 Device by Does not apply route daily. 1 kit 0   buPROPion (WELLBUTRIN XL) 150 MG 24 hr tablet Take 150 mg by mouth daily.     clonazePAM (KLONOPIN) 0.5 MG tablet Take 0.5 mg by mouth 2 (two) times daily as needed for anxiety (Sometimes takes 3 tablets a day).      DULoxetine (CYMBALTA) 60 MG capsule Take 60 mg by mouth daily.     fluticasone (FLONASE) 50 MCG/ACT nasal spray SPRAY 2 SPRAYS INTO EACH NOSTRIL EVERY DAY 16 mL 0   furosemide (LASIX) 40 MG tablet TAKE 1 TABLET BY MOUTH EVERY DAY 30 tablet 1   gabapentin (NEURONTIN) 300 MG capsule Take by mouth 3 (three) times daily as needed.     glucose blood test strip Use as instructed 100 each 0   JARDIANCE 25 MG TABS tablet Take 25 mg by mouth daily.     Lancets (ONETOUCH ULTRASOFT) lancets Use as instructed 100 each 12   medroxyPROGESTERone (PROVERA) 10 MG tablet Take 1 tablet (10 mg total) by mouth daily. 30 tablet 11   metFORMIN  (GLUCOPHAGE) 1000 MG tablet TAKE 1 TABLET (1,000 MG TOTAL) BY MOUTH 2 (TWO) TIMES DAILY WITH A MEAL. 60 tablet 0   OXYGEN Inhale 1 L into the lungs at bedtime.     pantoprazole (PROTONIX) 40 MG tablet Take 1 tablet (40 mg total) by mouth daily. 90 tablet 1   TRELEGY ELLIPTA 100-62.5-25 MCG/ACT AEPB Inhale 1 puff into the lungs daily. 1 each 11   TRULICITY 4.5 EL/9.5VU SOPN Inject 0.5 mLs (4.5 mg total) subcutaneously every 7 (seven) daysInject 0.5 mLs (4.5 mg total) subcutaneously every 7 (seven) days 15 mL 1   No current facility-administered medications for this visit.    Allergies  Allergen Reactions   Penicillins Anaphylaxis    Has patient had a PCN reaction causing immediate rash, facial/tongue/throat swelling, SOB or lightheadedness with hypotension: Yes Has patient had a PCN reaction causing severe rash involving mucus membranes or skin necrosis: No Has patient had a PCN reaction that required hospitalization: No Has patient had a PCN reaction occurring within the last 10 years: No If all of the above answers are "NO", then may proceed with Cephalosporin use.   Throat swells   Victoza [Liraglutide]  Severe nausea   Vicodin [Hydrocodone-Acetaminophen] Rash    And hives    Family History  Problem Relation Age of Onset   Kidney disease Father    Diabetes Father    Dementia Father    Diabetes Sister    Diabetes Brother    Diabetes Mother    Diabetes Brother     Social History   Socioeconomic History   Marital status: Divorced    Spouse name: Not on file   Number of children: Not on file   Years of education: Not on file   Highest education level: Not on file  Occupational History   Not on file  Tobacco Use   Smoking status: Every Day    Packs/day: 1.50    Years: 39.00    Pack years: 58.50    Types: Cigarettes   Smokeless tobacco: Never   Tobacco comments:    2PPD 10/10/2020  Vaping Use   Vaping Use: Never used  Substance and Sexual Activity   Alcohol  use: No   Drug use: Yes    Types: Marijuana    Comment: States she smokes MJ when she does not have any Tramadol.   Sexual activity: Not on file  Other Topics Concern   Not on file  Social History Narrative   ** Merged History Encounter ** In the process of getting SSD for depression, DM and neuropathy      Active smoker [~ 2p/day; cutting down]; no alcohol; lives in [place]; self. Used to work in [employer]    Social Determinants of Health   Financial Resource Strain: Low Risk    Difficulty of Paying Living Expenses: Not hard at all  Food Insecurity: No Food Insecurity   Worried About Charity fundraiser in the Last Year: Never true   [place] in the Last Year: Never true  Transportation Needs: No Transportation Needs   Lack of Transportation (Medical): No   Lack of Transportation (Non-Medical): No  Physical Activity: Inactive   Days of Exercise per Week: 0 days   Minutes of Exercise per Session: 0 min  Stress: No Stress Concern Present   Feeling of Stress : Only a little  Social Connections: Socially Isolated   Frequency of Communication with Friends and Family: More than three times a week   Frequency of Social Gatherings with Friends and Family: More than three times a week   Attends Religious Services: Never   Marine scientist or Organizations: No   Attends Music therapist: Never   Marital Status: Divorced  Human resources officer Violence: Not At Risk   Fear of Current or Ex-Partner: No   Emotionally Abused: No   Physically Abused: No   Sexually Abused: No     Constitutional: Denies fever, malaise, fatigue, headache or abrupt weight changes.  HEENT: Denies eye pain, eye redness, ear pain, ringing in the ears, wax buildup, runny nose, nasal congestion, bloody nose, or sore throat. Respiratory: Denies difficulty breathing, shortness of breath, cough or sputum production.   Cardiovascular: Denies chest pain, chest tightness, palpitations or swelling  in the hands or feet.  Gastrointestinal: Denies abdominal pain, bloating, constipation, diarrhea or blood in the stool.  GU: Denies urgency, frequency, pain with urination, burning sensation, blood in urine, odor or discharge. Musculoskeletal: Denies decrease in range of motion, difficulty with gait, muscle pain or joint pain and swelling.  Skin: Denies redness, rashes, lesions or ulcercations.  Neurological: Denies dizziness, difficulty with memory, difficulty with  speech or problems with balance and coordination.  Psych: Pt has a history of anxiety and depression. Denies SI/HI.  No other specific complaints in a complete review of systems (except as listed in HPI above).  Objective:   Physical Exam   There were no vitals taken for this visit. Wt Readings from Last 3 Encounters:  01/16/21 131 lb 3.2 oz (59.5 kg)  01/15/21 131 lb (59.4 kg)  10/10/20 131 lb (59.4 kg)    General: Appears their stated age, well developed, well nourished in NAD. Skin: Warm, dry and intact. No rashes, lesions or ulcerations noted. HEENT: Head: normal shape and size; Eyes: sclera white, no icterus, conjunctiva pink, PERRLA and EOMs intact; Ears: Tm's gray and intact, normal light reflex; Nose: mucosa pink and moist, septum midline; Throat/Mouth: Teeth present, mucosa pink and moist, no exudate, lesions or ulcerations noted.  Neck:  Neck supple, trachea midline. No masses, lumps or thyromegaly present.  Cardiovascular: Normal rate and rhythm. S1,S2 noted.  No murmur, rubs or gallops noted. No JVD or BLE edema. No carotid bruits noted. Pulmonary/Chest: Normal effort and positive vesicular breath sounds. No respiratory distress. No wheezes, rales or ronchi noted.  Abdomen: Soft and nontender. Normal bowel sounds. No distention or masses noted. Liver, spleen and kidneys non palpable. Musculoskeletal: Normal range of motion. No signs of joint swelling. No difficulty with gait.  Neurological: Alert and oriented.  Cranial nerves II-XII grossly intact. Coordination normal.  Psychiatric: Mood and affect normal. Behavior is normal. Judgment and thought content normal.    BMET    Component Value Date/Time   NA 139 09/11/2020 1448   NA 139 06/25/2017 1027   NA 134 (L) 12/07/2012 1809   K 3.9 09/11/2020 1448   K 3.4 (L) 12/07/2012 1809   CL 96 (L) 09/11/2020 1448   CL 103 12/07/2012 1809   CO2 33 (H) 09/11/2020 1448   CO2 25 12/07/2012 1809   GLUCOSE 124 (H) 09/11/2020 1448   GLUCOSE 274 (H) 12/07/2012 1809   BUN 14 09/11/2020 1448   BUN 10 06/25/2017 1027   BUN 10 12/07/2012 1809   CREATININE 0.83 09/11/2020 1448   CREATININE 0.81 09/07/2019 1019   CALCIUM 9.5 09/11/2020 1448   CALCIUM 8.6 12/07/2012 1809   GFRNONAA >60 09/11/2020 1448   GFRNONAA 84 09/07/2019 1019   GFRAA 97 09/07/2019 1019    Lipid Panel     Component Value Date/Time   CHOL 205 (H) 01/04/2019 0929   CHOL 120 12/24/2016 1827   TRIG 87 01/04/2019 0929   HDL 40 (L) 01/04/2019 0929   HDL 29 (L) 12/24/2016 1827   CHOLHDL 5.1 (H) 01/04/2019 0929   LDLCALC 146 (H) 01/04/2019 0929    CBC    Component Value Date/Time   WBC 10.4 09/11/2020 1448   RBC 4.64 09/11/2020 1448   HGB 13.5 09/11/2020 1448   HGB 12.8 06/25/2017 1027   HCT 40.4 09/11/2020 1448   HCT 38.3 06/25/2017 1027   PLT 285 09/11/2020 1448   PLT 348 06/25/2017 1027   MCV 87.1 09/11/2020 1448   MCV 90 06/25/2017 1027   MCV 80 12/07/2012 1809   MCH 29.1 09/11/2020 1448   MCHC 33.4 09/11/2020 1448   RDW 15.1 09/11/2020 1448   RDW 15.0 06/25/2017 1027   RDW 17.8 (H) 12/07/2012 1809   LYMPHSABS 3.3 09/11/2020 1448   LYMPHSABS 3.3 (H) 09/11/2016 1131   MONOABS 0.5 09/11/2020 1448   EOSABS 0.3 09/11/2020 1448   EOSABS 0.2 09/11/2016  1131   BASOSABS 0.1 09/11/2020 1448   BASOSABS 0.0 09/11/2016 1131    Hgb A1C Lab Results  Component Value Date   HGBA1C 7.7 (A) 09/07/2019   HGBA1C 7.7 09/07/2019   HGBA1C 7.7 (A) 09/07/2019            Assessment & Plan:    [name], NP This visit occurred during the SARS-CoV-2 public health emergency.  Safety protocols were in place, including screening questions prior to the visit, additional usage of staff PPE, and extensive cleaning of exam room while observing appropriate contact time as indicated for disinfecting solutions.

## 2021-04-10 ENCOUNTER — Ambulatory Visit: Payer: Medicare HMO | Admitting: Internal Medicine

## 2021-04-17 ENCOUNTER — Other Ambulatory Visit: Payer: Self-pay | Admitting: Internal Medicine

## 2021-04-17 ENCOUNTER — Ambulatory Visit: Payer: Medicare HMO | Admitting: Internal Medicine

## 2021-04-17 NOTE — Telephone Encounter (Signed)
Requested Prescriptions  Pending Prescriptions Disp Refills   fluticasone (FLONASE) 50 MCG/ACT nasal spray [Pharmacy Med Name: FLUTICASONE PROP 50 MCG SPRAY] 16 mL 0    Sig: SPRAY 2 SPRAYS INTO EACH NOSTRIL EVERY DAY     Ear, Nose, and Throat: Nasal Preparations - Corticosteroids Passed - 04/17/2021 12:31 PM      Passed - Valid encounter within last 12 months    Recent Outpatient Visits          3 months ago Medicare annual wellness visit, subsequent   [facility], [place], NP   8 months ago Type 2 diabetes mellitus with complication, with long-term current use of insulin [facility] [place])   [facility] [place], [name], NP   10 months ago Fatigue, unspecified type   [place], NP   1 year ago Urinary frequency   [facility], [name], FNP   1 year ago [place], FNP      Future Appointments            In 2 days [name], [name], NP [facility], [place]   In 3 months [place], [name], NP [facility], [facility]

## 2021-04-18 ENCOUNTER — Telehealth: Payer: Medicare HMO

## 2021-04-18 ENCOUNTER — Telehealth: Payer: Self-pay | Admitting: Pharmacist

## 2021-04-18 NOTE — Telephone Encounter (Signed)
°  Chronic Care Management   Outreach Note  04/18/2021 Name: Brandi Calderon MRN: [id number] DOB: [birth year]  Referred by: [name], NP Reason for referral : No chief complaint on file.   Outreach to patient today by telephone. Reach patient who states it is not a good time to talk and asks to call me back in the afternoon. No call back received from patient today.  Follow Up Plan: Will collaborate with Care Guide to outreach to schedule follow up with me  Wallace Cullens, PharmD, [place] [facility] [phone]

## 2021-04-19 ENCOUNTER — Encounter: Payer: Self-pay | Admitting: Internal Medicine

## 2021-04-19 ENCOUNTER — Other Ambulatory Visit: Payer: Self-pay

## 2021-04-19 ENCOUNTER — Ambulatory Visit (INDEPENDENT_AMBULATORY_CARE_PROVIDER_SITE_OTHER): Payer: Medicare HMO | Admitting: Internal Medicine

## 2021-04-19 VITALS — BP 108/63 | HR 77 | Temp 97.3°F | Resp 18 | Ht 61.0 in | Wt 131.6 lb

## 2021-04-19 DIAGNOSIS — Z23 Encounter for immunization: Secondary | ICD-10-CM | POA: Diagnosis not present

## 2021-04-19 DIAGNOSIS — F32A Depression, unspecified: Secondary | ICD-10-CM

## 2021-04-19 DIAGNOSIS — F419 Anxiety disorder, unspecified: Secondary | ICD-10-CM

## 2021-04-19 DIAGNOSIS — R69 Illness, unspecified: Secondary | ICD-10-CM | POA: Diagnosis not present

## 2021-04-19 MED ORDER — CLONAZEPAM 0.5 MG PO TABS: 0.5000 mg | ORAL_TABLET | Freq: Every day | ORAL | 0 refills | Status: DC | PRN

## 2021-04-19 NOTE — Patient Instructions (Signed)
Major Depressive Disorder, Adult Major depressive disorder is a mental health condition. This disorder affects feelings. It can also affect the body. Symptoms of this condition last most of the day, almost every day, for 2 weeks. This disorder can affect: Relationships. Daily activities, such as work and school. Activities that you normally like to do. What are the causes? The cause of this condition is not known. The disorder is likely caused by a mix of things, including: Your personality, such as being a shy person. Your behavior, or how you act toward others. Your thoughts and feelings. Too much alcohol or drugs. How you react to stress. Health and mental problems that you have had for a long time. Things that hurt you in the past (trauma). Big changes in your life, such as divorce. What increases the risk? The following factors may make you more likely to develop this condition: Having family members with depression. Being a woman. Problems in the family. Low levels of some brain chemicals. Things that caused you pain as a child, especially if you lost a parent or were abused. A lot of stress in your life, such as from: Living without basic needs of life, such as food and shelter. Being treated poorly because of race, sex, or religion (discrimination). Health and mental problems that you have had for a long time. What are the signs or symptoms? The main symptoms of this condition are: Being sad all the time. Being grouchy all the time. Loss of interest in things and activities. Other symptoms include: Sleeping too much or too little. Eating too much or too little. Gaining or losing weight, without knowing why. Feeling tired or having low energy. Being restless and weak. Feeling hopeless, worthless, or guilty. Trouble thinking clearly or making decisions. Thoughts of hurting yourself or others, or thoughts of ending your life. Spending a lot of time alone. Inability to  complete common tasks of daily life. If you have very bad MDD, you may: Believe things that are not true. Hear, see, taste, or feel things that are not there. Have mild depression that lasts for at least 2 years. Feel very sad and hopeless. Have trouble speaking or moving. How is this treated? This condition may be treated with: Talk therapy. This teaches you to know bad thoughts, feelings, and actions and how to change them. This can also help you to communicate with others. This can be done with members of your family. Medicines. These can be used to treat worry (anxiety), depression, or low levels of chemicals in the brain. Lifestyle changes. You may need to: Limit alcohol use. Limit drug use. Get regular exercise. Get plenty of sleep. Make healthy eating choices. Spend more time outdoors. Brain stimulation. This treatment excites the brain. This is done when symptoms are very bad or have not gotten better with other treatments. Follow these instructions at home: Activity Get regular exercise as told. Spend time outdoors as told. Make time to do the things you enjoy. Find ways to deal with stress. Try to: Meditate. Do deep breathing. Spend time in nature. Keep a journal. Return to your normal activities as told by your doctor. Ask your doctor what activities are safe for you. Alcohol and drug use If you drink alcohol: Limit how much you use to: 0-1 drink a day for women. 0-2 drinks a day for men. Be aware of how much alcohol is in your drink. In the [place] one drink equals one 12 oz bottle of beer (355 mL),  one 5 oz glass of wine (148 mL), or one 1 oz glass of hard liquor (44 mL). Talk to your doctor about: Alcohol use. Alcohol can affect some medicines. Any drug use. General instructions  Take over-the-counter and prescription medicines and herbal preparations only as told by your doctor. Eat a healthy diet. Get a lot of sleep. Think about joining a support group.  Your doctor may be able to suggest one. Keep all follow-up visits as told by your doctor. This is important. Where to find more information: [employer] on Mental Illness: www.nami.[place]: https://carter.com/ American Psychiatric Association: www.psychiatry.org/patients-families/ Contact a doctor if: Your symptoms get worse. You get new symptoms. Get help right away if: You hurt yourself. You have serious thoughts about hurting yourself or others. You see, hear, taste, smell, or feel things that are not there. If you ever feel like you may hurt yourself or others, or have thoughts about taking your own life, get help right away. Go to your nearest emergency department or: Call your local emergency services (911 in the [place] Call a suicide crisis helpline, such as the [facility] at 701-338-9647 or 988 in the [place]. This is open 24 hours a day in the [place] Text the Crisis Text Line at 610-048-4870 (in the [place].). Summary Major depressive disorder is a mental health condition. This disorder affects feelings. Symptoms of this condition last most of the day, almost every day, for 2 weeks. The symptoms of this disorder can cause problems with relationships and with daily activities. There are treatments and support for people who get this disorder. You may need more than one type of treatment. Get help right away if you have serious thoughts about hurting yourself or others. This information is not intended to replace advice given to you by your health care provider. Make sure you discuss any questions you have with your health care provider. Document Revised: 10/11/2020 Document Reviewed: 02/27/2019 Elsevier Patient Education  2022 [employer].

## 2021-04-19 NOTE — Assessment & Plan Note (Signed)
Start taking Wellbutrin every other day x 1 week, then every 2 days x 1 week, then stop Continue Duloxetine Clonazepam refilled today Referral to psychiatry for medication management

## 2021-04-19 NOTE — Progress Notes (Signed)
Subjective:    Patient ID: Brandi Calderon, female    DOB: [birth year], 56 y.o.   MRN: [id number]  HPI  Patient presents to clinic today for follow up of anxiety and depression. This is currently managed on Duloxetine, Wellbutrin and Clonazepam. She was seeing [facility] but reports they no longer accept her insurance. She would like to come off the Wellbutrin, and does not think she needs this at this time. She is not currently seeing a therapist and would not like a referral at this time. She denies SI/HI.  Review of Systems     Past Medical History:  Diagnosis Date   Anxiety    Arthritis    Asthma    Back pain    COPD (chronic obstructive pulmonary disease) (HCC)    Depression    Diabetes mellitus    Headache    History of COVID-19    Hyperlipidemia    Hypertension    Tendonitis     Current Outpatient Medications  Medication Sig Dispense Refill   albuterol (VENTOLIN HFA) 108 (90 Base) MCG/ACT inhaler INHALE 1-2 PUFFS BY MOUTH EVERY 6 HOURS AS NEEDED FOR WHEEZE OR SHORTNESS OF BREATH 6.7 each 1   Aspirin-Salicylamide-Caffeine (BC HEADACHE POWDER PO) Take 4 packets by mouth daily as needed (pain).     atorvastatin (LIPITOR) 20 MG tablet Take 1 tablet (20 mg total) by mouth daily at 6 PM. 90 tablet 0   Blood Glucose Monitoring Suppl (ONETOUCH ULTRALINK) w/Device KIT 1 Device by Does not apply route daily. 1 kit 0   buPROPion (WELLBUTRIN XL) 150 MG 24 hr tablet Take 150 mg by mouth daily.     clonazePAM (KLONOPIN) 0.5 MG tablet Take 0.5 mg by mouth 2 (two) times daily as needed for anxiety (Sometimes takes 3 tablets a day).      DULoxetine (CYMBALTA) 60 MG capsule Take 60 mg by mouth daily.     fluticasone (FLONASE) 50 MCG/ACT nasal spray SPRAY 2 SPRAYS INTO EACH NOSTRIL EVERY DAY 16 mL 0   furosemide (LASIX) 40 MG tablet TAKE 1 TABLET BY MOUTH EVERY DAY 30 tablet 1   gabapentin (NEURONTIN) 300 MG capsule Take by mouth 3 (three) times daily as needed.     glucose blood test strip  Use as instructed 100 each 0   JARDIANCE 25 MG TABS tablet Take 25 mg by mouth daily.     Lancets (ONETOUCH ULTRASOFT) lancets Use as instructed 100 each 12   medroxyPROGESTERone (PROVERA) 10 MG tablet Take 1 tablet (10 mg total) by mouth daily. 30 tablet 11   metFORMIN (GLUCOPHAGE) 1000 MG tablet TAKE 1 TABLET (1,000 MG TOTAL) BY MOUTH 2 (TWO) TIMES DAILY WITH A MEAL. 60 tablet 0   OXYGEN Inhale 1 L into the lungs at bedtime.     pantoprazole (PROTONIX) 40 MG tablet Take 1 tablet (40 mg total) by mouth daily. 90 tablet 1   TRELEGY ELLIPTA 100-62.5-25 MCG/ACT AEPB Inhale 1 puff into the lungs daily. 1 each 11   TRULICITY 4.5 XJ/8.8TG SOPN Inject 0.5 mLs (4.5 mg total) subcutaneously every 7 (seven) daysInject 0.5 mLs (4.5 mg total) subcutaneously every 7 (seven) days 15 mL 1   No current facility-administered medications for this visit.    Allergies  Allergen Reactions   Penicillins Anaphylaxis    Has patient had a PCN reaction causing immediate rash, facial/tongue/throat swelling, SOB or lightheadedness with hypotension: Yes Has patient had a PCN reaction causing severe rash involving mucus membranes or skin  necrosis: No Has patient had a PCN reaction that required hospitalization: No Has patient had a PCN reaction occurring within the last 10 years: No If all of the above answers are "NO", then may proceed with Cephalosporin use.   Throat swells   Victoza [Liraglutide]     Severe nausea   Vicodin [Hydrocodone-Acetaminophen] Rash    And hives    Family History  Problem Relation Age of Onset   Kidney disease Father    Diabetes Father    Dementia Father    Diabetes Sister    Diabetes Brother    Diabetes Mother    Diabetes Brother     Social History   Socioeconomic History   Marital status: Divorced    Spouse name: Not on file   Number of children: Not on file   Years of education: Not on file   Highest education level: Not on file  Occupational History   Not on file   Tobacco Use   Smoking status: Every Day    Packs/day: 1.50    Years: 39.00    Pack years: 58.50    Types: Cigarettes   Smokeless tobacco: Never   Tobacco comments:    2PPD 10/10/2020  Vaping Use   Vaping Use: Never used  Substance and Sexual Activity   Alcohol use: No   Drug use: Yes    Types: Marijuana    Comment: States she smokes MJ when she does not have any Tramadol.   Sexual activity: Not on file  Other Topics Concern   Not on file  Social History Narrative   ** Merged History Encounter ** In the process of getting SSD for depression, DM and neuropathy      Active smoker [~ 2p/day; cutting down]; no alcohol; lives in [place]; self. Used to work in [employer]    Social Determinants of Health   Financial Resource Strain: Low Risk    Difficulty of Paying Living Expenses: Not hard at all  Food Insecurity: No Food Insecurity   Worried About Charity fundraiser in the Last Year: Never true   [place] in the Last Year: Never true  Transportation Needs: No Transportation Needs   Lack of Transportation (Medical): No   Lack of Transportation (Non-Medical): No  Physical Activity: Inactive   Days of Exercise per Week: 0 days   Minutes of Exercise per Session: 0 min  Stress: No Stress Concern Present   Feeling of Stress : Only a little  Social Connections: Socially Isolated   Frequency of Communication with Friends and Family: More than three times a week   Frequency of Social Gatherings with Friends and Family: More than three times a week   Attends Religious Services: Never   Marine scientist or Organizations: No   Attends Music therapist: Never   Marital Status: Divorced  Human resources officer Violence: Not At Risk   Fear of Current or Ex-Partner: No   Emotionally Abused: No   Physically Abused: No   Sexually Abused: No     Constitutional: Denies fever, malaise, fatigue, headache or abrupt weight changes.  Respiratory: Denies difficulty  breathing, shortness of breath, cough or sputum production.   Cardiovascular: Denies chest pain, chest tightness, palpitations or swelling in the hands or feet.  Neurological: Denies dizziness, difficulty with memory, difficulty with speech or problems with balance and coordination.  Psych: Pt has a history of anxiety and depression. Denies SI/HI.  No other specific complaints in a  complete review of systems (except as listed in HPI above).  Objective:   Physical Exam  BP 108/63 (BP Location: Left Arm, Patient Position: Sitting, Cuff Size: Normal)    Pulse 77    Temp (!) 97.3 F (36.3 C) (Temporal)    Resp 18    Ht 5' 1"  (1.549 m)    Wt 131 lb 9.6 oz (59.7 kg)    SpO2 100%    BMI 24.87 kg/m   Wt Readings from Last 3 Encounters:  01/16/21 131 lb 3.2 oz (59.5 kg)  01/15/21 131 lb (59.4 kg)  10/10/20 131 lb (59.4 kg)    General: Appears her stated age, well developed, well nourished in NAD. Cardiovascular: Normal rate. Pulmonary/Chest: Normal effort. Neurological: Alert and oriented.  Psychiatric: Mood and affect mildly flat. Behavior is normal. Judgment and thought content normal.     BMET    Component Value Date/Time   NA 139 09/11/2020 1448   NA 139 06/25/2017 1027   NA 134 (L) 12/07/2012 1809   K 3.9 09/11/2020 1448   K 3.4 (L) 12/07/2012 1809   CL 96 (L) 09/11/2020 1448   CL 103 12/07/2012 1809   CO2 33 (H) 09/11/2020 1448   CO2 25 12/07/2012 1809   GLUCOSE 124 (H) 09/11/2020 1448   GLUCOSE 274 (H) 12/07/2012 1809   BUN 14 09/11/2020 1448   BUN 10 06/25/2017 1027   BUN 10 12/07/2012 1809   CREATININE 0.83 09/11/2020 1448   CREATININE 0.81 09/07/2019 1019   CALCIUM 9.5 09/11/2020 1448   CALCIUM 8.6 12/07/2012 1809   GFRNONAA >60 09/11/2020 1448   GFRNONAA 84 09/07/2019 1019   GFRAA 97 09/07/2019 1019    Lipid Panel     Component Value Date/Time   CHOL 205 (H) 01/04/2019 0929   CHOL 120 12/24/2016 1827   TRIG 87 01/04/2019 0929   HDL 40 (L) 01/04/2019 0929    HDL 29 (L) 12/24/2016 1827   CHOLHDL 5.1 (H) 01/04/2019 0929   LDLCALC 146 (H) 01/04/2019 0929    CBC    Component Value Date/Time   WBC 10.4 09/11/2020 1448   RBC 4.64 09/11/2020 1448   HGB 13.5 09/11/2020 1448   HGB 12.8 06/25/2017 1027   HCT 40.4 09/11/2020 1448   HCT 38.3 06/25/2017 1027   PLT 285 09/11/2020 1448   PLT 348 06/25/2017 1027   MCV 87.1 09/11/2020 1448   MCV 90 06/25/2017 1027   MCV 80 12/07/2012 1809   MCH 29.1 09/11/2020 1448   MCHC 33.4 09/11/2020 1448   RDW 15.1 09/11/2020 1448   RDW 15.0 06/25/2017 1027   RDW 17.8 (H) 12/07/2012 1809   LYMPHSABS 3.3 09/11/2020 1448   LYMPHSABS 3.3 (H) 09/11/2016 1131   MONOABS 0.5 09/11/2020 1448   EOSABS 0.3 09/11/2020 1448   EOSABS 0.2 09/11/2016 1131   BASOSABS 0.1 09/11/2020 1448   BASOSABS 0.0 09/11/2016 1131    Hgb A1C Lab Results  Component Value Date   HGBA1C 7.7 (A) 09/07/2019   HGBA1C 7.7 09/07/2019   HGBA1C 7.7 (A) 09/07/2019           Assessment & Plan:   [name], NP This visit occurred during the SARS-CoV-2 public health emergency.  Safety protocols were in place, including screening questions prior to the visit, additional usage of staff PPE, and extensive cleaning of exam room while observing appropriate contact time as indicated for disinfecting solutions.

## 2021-04-24 DIAGNOSIS — J449 Chronic obstructive pulmonary disease, unspecified: Secondary | ICD-10-CM | POA: Diagnosis not present

## 2021-04-27 DIAGNOSIS — R69 Illness, unspecified: Secondary | ICD-10-CM | POA: Diagnosis not present

## 2021-04-27 DIAGNOSIS — E1142 Type 2 diabetes mellitus with diabetic polyneuropathy: Secondary | ICD-10-CM | POA: Diagnosis not present

## 2021-04-27 DIAGNOSIS — F17200 Nicotine dependence, unspecified, uncomplicated: Secondary | ICD-10-CM | POA: Diagnosis not present

## 2021-05-01 DIAGNOSIS — Z7984 Long term (current) use of oral hypoglycemic drugs: Secondary | ICD-10-CM

## 2021-05-01 DIAGNOSIS — E785 Hyperlipidemia, unspecified: Secondary | ICD-10-CM

## 2021-05-01 DIAGNOSIS — F419 Anxiety disorder, unspecified: Secondary | ICD-10-CM

## 2021-05-01 DIAGNOSIS — E1169 Type 2 diabetes mellitus with other specified complication: Secondary | ICD-10-CM

## 2021-05-01 DIAGNOSIS — F17210 Nicotine dependence, cigarettes, uncomplicated: Secondary | ICD-10-CM

## 2021-05-01 DIAGNOSIS — Z794 Long term (current) use of insulin: Secondary | ICD-10-CM

## 2021-05-01 DIAGNOSIS — F32A Depression, unspecified: Secondary | ICD-10-CM

## 2021-05-04 ENCOUNTER — Telehealth: Payer: Self-pay

## 2021-05-04 ENCOUNTER — Other Ambulatory Visit: Payer: Self-pay | Admitting: Internal Medicine

## 2021-05-04 DIAGNOSIS — K219 Gastro-esophageal reflux disease without esophagitis: Secondary | ICD-10-CM

## 2021-05-04 NOTE — Chronic Care Management (AMB) (Signed)
°  Care Management   Note  05/04/2021 Name: Brandi Calderon MRN: [id number] DOB: [birth year]  Brandi Calderon is a 56 y.o. year old female who is a primary care patient of [name], NP and is actively engaged with the care management team. I reached out to [name] by phone today to assist with re-scheduling a follow up visit with the Pharmacist  Follow up plan: Unsuccessful telephone outreach attempt made. A HIPAA compliant phone message was left for the patient providing contact information and requesting a return call.  The care management team will reach out to the patient again over the next 7 days.  If patient returns call to provider office, please advise to call [place]  at [place], [place], [place], [place] [address] Direct Dial: [phone] [email or web address]

## 2021-05-04 NOTE — Telephone Encounter (Signed)
Requested Prescriptions  Pending Prescriptions Disp Refills   pantoprazole (PROTONIX) 40 MG tablet [Pharmacy Med Name: PANTOPRAZOLE SOD DR 40 MG TAB] 90 tablet 1    Sig: TAKE 1 TABLET BY MOUTH EVERY DAY     Gastroenterology: Proton Pump Inhibitors Passed - 05/04/2021  1:55 AM      Passed - Valid encounter within last 12 months    Recent Outpatient Visits          2 weeks ago Need for immunization against influenza   [facility] [place], [name], NP   3 months ago Medicare annual wellness visit, subsequent   [facility] [place], [name], NP   9 months ago Type 2 diabetes mellitus with complication, with long-term current use of insulin [facility])   [facility], [name], NP   10 months ago Fatigue, unspecified type   [facility] Kathrine Haddock, NP   1 year ago Urinary frequency   [facility], [name], FNP      Future Appointments            In 2 months [name], [name], NP [facility], [place]   In 2 months [place], [name], NP [facility], [facility]

## 2021-05-10 ENCOUNTER — Other Ambulatory Visit: Payer: Self-pay | Admitting: Internal Medicine

## 2021-05-10 NOTE — Telephone Encounter (Signed)
Requested medications are due for refill today.  yes  Requested medications are on the active medications list.  yes  Last refill. 04/17/2021 16/0 refills  Future visit scheduled.   yes  Notes to clinic.  Medication not delegated.    Requested Prescriptions  Pending Prescriptions Disp Refills   fluticasone (FLONASE) 50 MCG/ACT nasal spray [Pharmacy Med Name: FLUTICASONE PROP 50 MCG SPRAY] 16 mL 0    Sig: SPRAY 2 SPRAYS INTO EACH NOSTRIL EVERY DAY     Not Delegated - Ear, Nose, and Throat: Nasal Preparations - Corticosteroids Failed - 05/10/2021  2:36 PM      Failed - This refill cannot be delegated      Passed - Valid encounter within last 12 months    Recent Outpatient Visits           3 weeks ago Need for immunization against influenza   [place], [name], NP   3 months ago Medicare annual wellness visit, subsequent   [facility] [place], [place] W, NP   9 months ago Type 2 diabetes mellitus with complication, with long-term current use of insulin [facility])   [facility] [place], [name], NP   11 months ago Fatigue, unspecified type   [facility] Kathrine Haddock, NP   1 year ago Urinary frequency   [facility], [name], FNP       Future Appointments             In 2 months [name], [name], NP [facility], [place]   In 2 months [place], [name], NP [facility], [facility]

## 2021-05-14 ENCOUNTER — Telehealth: Payer: Self-pay

## 2021-05-14 NOTE — Telephone Encounter (Signed)
°  Care Management   Follow Up Note   05/14/2021 Name: Brandi Calderon MRN: [id number] DOB: [birth year]   Referred by: [name], NP Reason for referral : Chronic Care Management (RNCM: Follow up for Chronic Disease Management and Care Coordination Needs)   An unsuccessful telephone outreach was attempted today. The patient was referred to the case management team for assistance with care management and care coordination.   Follow Up Plan: The care management team will reach out to the patient again over the next 30 to 60 days.   Noreene Larsson RN, MSN, [place]: [phone]

## 2021-05-21 ENCOUNTER — Ambulatory Visit (INDEPENDENT_AMBULATORY_CARE_PROVIDER_SITE_OTHER): Payer: Medicare HMO

## 2021-05-21 ENCOUNTER — Telehealth: Payer: Self-pay

## 2021-05-21 ENCOUNTER — Telehealth: Payer: Medicare HMO

## 2021-05-21 DIAGNOSIS — F331 Major depressive disorder, recurrent, moderate: Secondary | ICD-10-CM

## 2021-05-21 DIAGNOSIS — F32A Depression, unspecified: Secondary | ICD-10-CM

## 2021-05-21 DIAGNOSIS — F17200 Nicotine dependence, unspecified, uncomplicated: Secondary | ICD-10-CM

## 2021-05-21 DIAGNOSIS — Z794 Long term (current) use of insulin: Secondary | ICD-10-CM

## 2021-05-21 DIAGNOSIS — J418 Mixed simple and mucopurulent chronic bronchitis: Secondary | ICD-10-CM

## 2021-05-21 DIAGNOSIS — I10 Essential (primary) hypertension: Secondary | ICD-10-CM

## 2021-05-21 DIAGNOSIS — E1169 Type 2 diabetes mellitus with other specified complication: Secondary | ICD-10-CM

## 2021-05-21 DIAGNOSIS — J449 Chronic obstructive pulmonary disease, unspecified: Secondary | ICD-10-CM

## 2021-05-21 DIAGNOSIS — E785 Hyperlipidemia, unspecified: Secondary | ICD-10-CM

## 2021-05-21 DIAGNOSIS — F419 Anxiety disorder, unspecified: Secondary | ICD-10-CM

## 2021-05-21 DIAGNOSIS — E118 Type 2 diabetes mellitus with unspecified complications: Secondary | ICD-10-CM

## 2021-05-21 NOTE — Telephone Encounter (Signed)
°  Care Management   Follow Up Note   05/21/2021 Name: Brandi Calderon MRN: [id number] DOB: Oct 12, 1965   Referred by: [name], NP Reason for referral : Chronic Care Management (RNCM: Follow up for Chronic Disease Management and Care Coordination Needs )   An unsuccessful telephone outreach was attempted today. The patient was referred to the case management team for assistance with care management and care coordination.   Follow Up Plan: Telephone follow up appointment with care management team member scheduled for: 07-30-2021 at 230 pm  Noreene Larsson RN, MSN, [place] Mobile: [phone]

## 2021-05-21 NOTE — Patient Instructions (Signed)
Visit Information  Thank you for taking time to visit with me today. Please don't hesitate to contact me if I can be of assistance to you before our next scheduled telephone appointment.  Following are the goals we discussed today:  RNCM Clinical Goal(s):  Patient will verbalize understanding of plan for management of HTN, HLD, COPD, DMII, Anxiety, Depression, and Tobacco Use  verbalize basic understanding of HTN, HLD, COPD, DMII, Anxiety, Depression, and Tobacco Use disease process and self health management plan  take all medications exactly as prescribed and will call provider for medication related questions demonstrate understanding of rationale for each prescribed medication and take as prescribed  attend all scheduled medical appointments: 07-17-2021 at 240 pm with the pcp  demonstrate improved and ongoing adherence to prescribed treatment plan for HTN, HLD, COPD, DMII, Anxiety, Depression, and Tobacco Use as evidenced by daily monitoring and recording of CBG  adherence to ADA/ carb modified diet adherence to prescribed medication regimen contacting provider for new or worsened symptoms or questions and working with the CCM team to optimize health and well being demonstrate improved and ongoing health management independence  continue to work with Consulting civil engineer and/or Social Worker to address care management and care coordination needs related to HTN, HLD, COPD, DMII, Anxiety, Depression, and Tobacco Use  demonstrate a decrease in HTN, HLD, COPD, DMII, Anxiety, Depression, and Tobacco Use exacerbations  demonstrate ongoing self health care management ability and effective management of chronic diseases through collaboration with RN Care manager, provider, and care team.    Interventions: 1:1 collaboration with primary care provider regarding development and update of comprehensive plan of care as evidenced by provider attestation and co-signature Inter-disciplinary care team collaboration  (see longitudinal plan of care) Evaluation of current treatment plan related to  self management and patient's adherence to plan as established by provider     SDOH Barriers (Status: Goal on track: YES.)  Patient interviewed and SDOH assessment performed        SDOH Interventions     Flowsheet Row Most Recent Value  SDOH Interventions    Food Insecurity Interventions Intervention Not Indicated  Financial Strain Interventions Intervention Not Indicated  Housing Interventions Intervention Not Indicated  Intimate Partner Violence Interventions Intervention Not Indicated  Physical Activity Interventions Other (Comments)  [no structured activity]  Stress Interventions Other (Comment)  [wants to work on smoking cessation]  Social Connections Interventions Other (Comment)  [good family support]  Transportation Interventions Intervention Not Indicated  Depression Interventions/Treatment  --  [the patient lost her son in 2016. The patient smokes 2 ppd, working with LCSW, trying to take ownership of her health and well being]       Patient interviewed and appropriate assessments performed Discussed plans with patient for ongoing care management follow up and provided patient with direct contact information for care management team Advised patient to call the office for changed in [place] or needs that the patient may need assistance with  Assisted patient/caregiver with obtaining information about health plan benefits- 01-15-2021: Review of OTC benefits and if the patient has an OTC benefit plan       COPD: (Status: Goal on track: YES.) Reviewed medications with patient, including use of prescribed maintenance and rescue inhalers, and provided instruction on medication management and the importance of adherence. 05-21-2021: States compliance with medications. Has her medications.  Provided patient with basic written and verbal COPD education on self care/management/and exacerbation prevention.  01-15-2021: The patient had a CT screening today to  evaluate for lung cancer or other issues; Advised patient to track and manage COPD triggers. 01-15-2021: Knows that smoking cessation will help her with her COPD exacerbation. Has Chantix and wants to set a start date for smoking cessation. 05-21-2021: The patient has cut back on her smoking to 1 ppd. She states that she had a time last week with her allergies but she is better today. She states that she does have changes in her breathing sometimes when the weather changes. Review of possible triggers she could be experiencing or could have;  Provided written and verbal instructions on pursed lip breathing and utilized returned demonstration as teach back; Provided instruction about proper use of medications used for management of COPD including inhalers; Advised patient to self assesses COPD action plan zone and make appointment with provider if in the yellow zone for 48 hours without improvement. 05-21-2021: The patient is using supplemental oxygen at night and states this is very helpful for her and she feels more rested when she gets up each morning.  Advised patient to engage in light exercise as tolerated 3-5 days a week to aid in the the management of COPD; Provided education about and advised patient to utilize infection prevention strategies to reduce risk of respiratory infection; Discussed the importance of adequate rest and management of fatigue with COPD;   Diabetes:  (Status: Goal on Track (progressing): YES.)      Lab Results  Component Value Date    HGBA1C 7.7 (A) 09/07/2019    HGBA1C 7.7 09/07/2019    HGBA1C 7.7 (A) 09/07/2019  At endocrinologist office on 01-12-2021: A1C was 7.8 At endocrinologist office on 04-27-2021: A1C was 7.6 Assessed patient's understanding of A1c goal: <7% Provided education to patient about basic DM disease process; Reviewed medications with patient and discussed importance of medication adherence.  05-21-2021: The patient states compliance with her medications;        Reviewed prescribed diet with patient heart healthy/ADA diet. 05-21-2021: The patient is compliant with heart healthy diet, review of healthy eating habits; Counseled on importance of regular laboratory monitoring as prescribed. 01-15-2021: Recent lab work with endocrinologist on 01-12-2021 with A1C of 7.8. 05-21-2021: Lab work on 04-27-2021 at the endocrinology office A1C was 7.6        Discussed plans with patient for ongoing care management follow up and provided patient with direct contact information for care management team;      Provided patient with written educational materials related to hypo and hyperglycemia and importance of correct treatment. 05-21-2021: States the lowest she has seen is 90 and the highest she has seen is 160. She is trying to check it more frequently.        Reviewed scheduled/upcoming provider appointments including: 07-17-2021 with pcp at 240 pm;         Advised patient, providing education and rationale, to check cbg as directed  and record. 05-21-2021: Review of checking glucose on a regular basis. Review of fasting goal of <130 and post prandial of <180. Patient states she has upcoming appointments to see the endocrinologist.       call provider for findings outside established parameters;       Review of patient status, including review of consultants reports, relevant laboratory and other test results, and medications completed;         Depression and Anxiety  (Status: Goal on track: YES.) Evaluation of current treatment plan related to Anxiety and Depression, Mental Health Concerns  self-management and patient's adherence to  plan as established by provider. 05-21-2021: The patient feels she is doing good. She is excited about a trip she and her boyfriend are going to in March to the beach.  She is still trying to find a new clinic to go to that her insurance with cover. She states that she is working on  it but ask if the pcp could refill her klonopin one more time because she was going to run out before securing and appointment. In basket message sent to the pcp and will ask the pcp for recommendations. Will follow up accordingly after collaboration with the pcp.  Discussed plans with patient for ongoing care management follow up and provided patient with direct contact information for care management team Advised patient to call the office for changes in mood, depression or anxiety; Provided education to patient re: triggers that could cause exacerbation of depression and anxiety, also about the griefshare program since she lost her son in 2016. She states this has negatively impacted her health and she is really trying to do much better. 05-21-2021: The patient is in good spirits today and denies any acute findings. The patient states that she is cutting back on her smoking and feels she is doing well overall.  Reviewed medications with patient and discussed compliance/ 05-21-2021: States compliance with medications. Did ask for help with additional Klonopin refill since she has not secured a new clinic yet. In basket message sent to the pcp for recommendations; Provided patient with anxiety and depression educational materials related to effective measures in managing anxiety and depression. Has MyChart and is receptive to eductional information; Reviewed scheduled/upcoming provider appointments including 07-17-2021 at 240 pm; Social Work referral for ongoing support and education of depression and anxiety, currently established and working with the [facility]; Pharmacy referral for ongoing support and education for medication management and medications needs, the patient is currenlty established with the pharm D and working with the pharm D for needs; Discussed plans with patient for ongoing care management follow up and provided patient with direct contact information for care management team; Advised patient  to discuss insomnia and to write down questions to ask the provider and discuss these with provider; Screening for signs and symptoms of depression related to chronic disease state;  Assessed social determinant of health barriers;    Hyperlipidemia:  (Status: Goal on track: NO.)      Lab Results  Component Value Date    CHOL 205 (H) 01/04/2019    HDL 40 (L) 01/04/2019    LDLCALC 146 (H) 01/04/2019    TRIG 87 01/04/2019    CHOLHDL 5.1 (H) 01/04/2019      Medication review performed; medication list updated in electronic medical record. 05-21-2021 takes Lipitor 20 mg QD Provider established cholesterol goals reviewed; Counseled on importance of regular laboratory monitoring as prescribed. 05-21-2021: Needs updated lipid panel. Education and support given.  Provided HLD educational materials; Reviewed role and benefits of statin for ASCVD risk reduction; Discussed strategies to manage statin-induced myalgias; Reviewed importance of limiting foods high in cholesterol: 05-21-2021: The patient states compliance with heart healthy/ADA diet  Screening for signs and symptoms of depression related to chronic disease state;  Assessed social determinant of health barriers;    Hypertension: (Status: Goal on track: YES.) Last practice recorded BP readings:     BP Readings from Last 3 Encounters:  04/19/21 108/63  01/16/21 115/71  10/10/20 120/66  Most recent eGFR/CrCl: No results found for: EGFR  No components found for: CRCL  Evaluation of current treatment plan related to hypertension self management and patient's adherence to plan as established by provider. 05-21-2021: The patient is doing well with blood pressures and HTN;   Provided education to patient re: stroke prevention, s/s of heart attack and stroke; Reviewed prescribed diet heart healthy/ADA diet. 05-21-2021: The patient is compliant with heart healthy/ADA diet Reviewed medications with patient and discussed importance of compliance.  05-21-2021: The patient is compliant with medications;  Counseled on adverse effects of illicit drug and excessive alcohol use in patients with high blood pressure;  Discussed plans with patient for ongoing care management follow up and provided patient with direct contact information for care management team; Advised patient, providing education and rationale, to monitor blood pressure daily and record, calling PCP for findings outside established parameters;  Reviewed scheduled/upcoming provider appointments including:  Provided education on prescribed diet Heart healthy/ADA;  Discussed complications of poorly controlled blood pressure such as heart disease, stroke, circulatory complications, vision complications, kidney impairment, sexual dysfunction;    Smoking Cessation: (Status: Goal on track: NO.) Reviewed smoking history:  tobacco abuse of 38 plus years; currently smoking 2 ppd ( down from 4 ppd). 05-21-2021: The patient is down to 1 ppd Previous quit attempts, unsuccessful 0 successful using 0  Reports smoking within 30 minutes of waking up Reports triggers to smoke include: grief process and death of her son in 06-01-2014. Stressors in life- elderly father sick and in SNF, family stressors, relationship stressors, worried about her health Reports motivation to quit smoking includes: knows it will be beneficial for her health and she will be able to breath better  On a scale of 1-10, reports MOTIVATION to quit is 7 On a scale of 1-10, reports CONFIDENCE in quitting is 5   Evaluation of current treatment plan reviewed; Advised patient to discuss smoking cessation options with provider; Provided contact information for [place] [facility] (1-800-QUIT-NOW); Reviewed smoking cessation techniques: removing cigarettes and smoking materials from environment, stress management, substitution of other forms of reinforcement, support of family/friends, written materials, and pharmacotherapy (has Chantix but has  not started taking yet, wants to set a start date, likely after Christmas 2022) Provided patient with printed smoking cessation educational materials; Reviewed scheduled/upcoming provider appointments including: 07-17-2021 at 240 pm; Discussed plans with patient for ongoing care management follow up and provided patient with direct contact information for care management team;   Patient Goals/Self-Care Activities: Patient will self administer medications as prescribed as evidenced by self report/primary caregiver report  Patient will attend all scheduled provider appointments as evidenced by clinician review of documented attendance to scheduled appointments and patient/caregiver report Patient will call pharmacy for medication refills as evidenced by patient report and review of pharmacy fill history as appropriate Patient will continue to perform ADL's independently as evidenced by patient/caregiver report Patient will continue to perform IADL's independently as evidenced by patient/caregiver report Patient will call provider office for new concerns or questions as evidenced by review of documented incoming telephone call notes and patient report Patient will work with BSW to address care coordination needs and will continue to work with the clinical team to address health care and disease management related needs as evidenced by documented adherence to scheduled care management/care coordination appointments - schedule appointment with eye doctor, - check blood sugar at prescribed times: before meals and at bedtime and when you have symptoms of low or high blood sugar, - check feet daily for cuts, sores or redness, - enter blood sugar readings  and medication or insulin into daily log, - take the blood sugar log to all doctor visits, - trim toenails straight across, - drink 6 to 8 glasses of water each day, - eat fish at least once per week, - fill half of plate with vegetables, - limit fast food meals  to no more than 1 per week, - manage portion size, - prepare main meal at home 3 to 5 days each week, - read food labels for fat, fiber, carbohydrates and portion size, - reduce red meat to 2 to 3 times a week, - switch to sugar-free drinks, - keep feet up while sitting, - wash and dry feet carefully every day, - wear comfortable, cotton socks, and - wear comfortable, well-fitting shoes - eliminate smoking in my home, - identify and avoid work-related triggers, - identify and remove indoor air pollutants, - limit outdoor activity during cold weather, - listen for public air quality announcements every day, - do breathing exercises every day, - eliminate symptom triggers at home, - follow rescue plan if symptoms flare-up, - keep follow-up appointments: with pcp and specialist, - use an extra pillow to sleep, - develop a new routine to improve sleep, - eat healthy/prescribed diet: heart healthy/ADA, - get at least 7 to 8 hours of sleep at night, - use devices that will help like a cane, sock-puller or reacher, - practice relaxation or meditation daily, - do breathing exercises at least 2 times each day, and - do exercises in a comfortable position that makes breathing as easy as possible - check blood pressure 3 times per week, - choose a place to take my blood pressure (home, clinic or office, retail store), - write blood pressure results in a log or diary, - learn about high blood pressure, - keep a blood pressure log, - take blood pressure log to all doctor appointments, - call doctor for signs and symptoms of high blood pressure, - develop an action plan for high blood pressure, - keep all doctor appointments, - take medications for blood pressure exactly as prescribed, - report new symptoms to your doctor, and - eat more whole grains, fruits and vegetables, lean meats and healthy fats - call for medicine refill 2 or 3 days before it runs out, - take all medications exactly as prescribed, - call doctor with any  symptoms you believe are related to your medicine, - call doctor when you experience any new symptoms, - go to all doctor appointments as scheduled, and - adhere to prescribed diet: heart healthy/ADA diet       Our next appointment is by telephone on 07-30-2021 at 230 pm  Please call the care guide team at [phone] if you need to cancel or reschedule your appointment.   If you are experiencing a Mental Health or [place] or need someone to talk to, please call the Suicide and Crisis Lifeline: 988 call the [place] National Suicide Prevention Lifeline: 973-003-0034 or TTY: 581-867-5357 TTY 757 486 8221) to talk to a trained counselor call 1-800-273-TALK (toll free, 24 hour hotline)   Patient verbalizes understanding of instructions and care plan provided today and agrees to view in [place]. Active MyChart status confirmed with patient.    Noreene Larsson RN, MSN, [place] Mobile: [phone]

## 2021-05-21 NOTE — Chronic Care Management (AMB) (Signed)
Chronic Care Management   CCM RN Visit Note  05/21/2021 Name: Brandi Calderon MRN: [id number] DOB: [birth year]  Subjective: Brandi Calderon is a 56 y.o. year old female who is a primary care patient of [name], NP. The care management team was consulted for assistance with disease management and care coordination needs.    Engaged with patient by telephone for follow up visit in response to provider referral for case management and/or care coordination services.   Consent to Services:  The patient was given information about Chronic Care Management services, agreed to services, and gave verbal consent prior to initiation of services.  Please see initial visit note for detailed documentation.   Patient agreed to services and verbal consent obtained.   Assessment: Review of patient past medical history, allergies, medications, health status, including review of consultants reports, laboratory and other test data, was performed as part of comprehensive evaluation and provision of chronic care management services.   SDOH (Social Determinants of Health) assessments and interventions performed:    CCM Care Plan  Allergies  Allergen Reactions   Penicillins Anaphylaxis    Has patient had a PCN reaction causing immediate rash, facial/tongue/throat swelling, SOB or lightheadedness with hypotension: Yes Has patient had a PCN reaction causing severe rash involving mucus membranes or skin necrosis: No Has patient had a PCN reaction that required hospitalization: No Has patient had a PCN reaction occurring within the last 10 years: No If all of the above answers are "NO", then may proceed with Cephalosporin use.   Throat swells   Victoza [Liraglutide]     Severe nausea   Vicodin [Hydrocodone-Acetaminophen] Rash    And hives    Outpatient Encounter Medications as of 05/21/2021  Medication Sig   albuterol (VENTOLIN HFA) 108 (90 Base) MCG/ACT inhaler INHALE 1-2 PUFFS BY MOUTH EVERY  6 HOURS AS NEEDED FOR WHEEZE OR SHORTNESS OF BREATH   Aspirin-Salicylamide-Caffeine (BC HEADACHE POWDER PO) Take 4 packets by mouth daily as needed (pain).   atorvastatin (LIPITOR) 20 MG tablet Take 1 tablet (20 mg total) by mouth daily at 6 PM.   Blood Glucose Monitoring Suppl (ONETOUCH ULTRALINK) w/Device KIT 1 Device by Does not apply route daily.   clonazePAM (KLONOPIN) 0.5 MG tablet Take 1 tablet (0.5 mg total) by mouth daily as needed for anxiety.   DULoxetine (CYMBALTA) 60 MG capsule Take 60 mg by mouth daily.   fluticasone (FLONASE) 50 MCG/ACT nasal spray SPRAY 2 SPRAYS INTO EACH NOSTRIL EVERY DAY   furosemide (LASIX) 40 MG tablet TAKE 1 TABLET BY MOUTH EVERY DAY   gabapentin (NEURONTIN) 300 MG capsule Take by mouth 3 (three) times daily as needed.   glipiZIDE (GLUCOTROL XL) 5 MG 24 hr tablet Take 5 mg by mouth daily.   glucose blood test strip Use as instructed   JARDIANCE 25 MG TABS tablet Take 25 mg by mouth daily.   Lancets (ONETOUCH ULTRASOFT) lancets Use as instructed   medroxyPROGESTERone (PROVERA) 10 MG tablet Take 1 tablet (10 mg total) by mouth daily.   metFORMIN (GLUCOPHAGE) 1000 MG tablet TAKE 1 TABLET (1,000 MG TOTAL) BY MOUTH 2 (TWO) TIMES DAILY WITH A MEAL.   OXYGEN Inhale 1 L into the lungs at bedtime.   pantoprazole (PROTONIX) 40 MG tablet TAKE 1 TABLET BY MOUTH EVERY DAY   TRELEGY ELLIPTA 100-62.5-25 MCG/ACT AEPB Inhale 1 puff into the lungs daily.   TRULICITY 4.5 VV/7.4MO SOPN Inject 0.5 mLs (4.5 mg total) subcutaneously every 7 (seven) daysInject  0.5 mLs (4.5 mg total) subcutaneously every 7 (seven) days   No facility-administered encounter medications on file as of 05/21/2021.    Patient Active Problem List   Diagnosis Date Noted   Snoring 08/30/2020   Neutrophilia 08/30/2020   Smoker 08/30/2020   Mixed simple and mucopurulent chronic bronchitis ([place]) 07/26/2020   Gastroesophageal reflux disease without esophagitis 07/26/2020   Hyperlipidemia associated with  type 2 diabetes mellitus ([place]) 05/24/2020   Anxiety and depression 07/23/2018   Chronic pain syndrome 07/23/2018   Neuropathy due to type 2 diabetes mellitus ([place]) - severe 12/19/2015   Type 2 diabetes mellitus with other specified complication ([place]) 29/92/4268    Conditions to be addressed/monitored:HTN, HLD, COPD, DMII, Anxiety, Depression, and Tobacco Use  Care Plan : RNCM: General Plan of Care (Adult) for Chronic Disease Managment and Care Coordination needs  Updates made by Vanita Ingles, RN since 05/21/2021 12:00 AM     Problem: RNCM: Development of Care Plan for Chronic Disease Management for Care Coordination Needs (COPD/HTN/HLD/DM/ Anxiety/Depression/Smoker)   Priority: High     Long-Range Goal: RNCM: Development of Care Plan for Chronic Disease Management for Care Coordination Needs (COPD/HTN/HLD/DM/ Anxiety/Depression/Smoker)   Start Date: 01/15/2021  Expected End Date: 01/15/2022  Priority: High  Note:   Current Barriers:  Knowledge Deficits related to plan of care for management of HTN, HLD, COPD, DMII, and Anxiety with Excessive Worry, Panic Symptoms, Social Anxiety,, Depression: depressed mood anxiety disturbed sleep, and Grief  Care Coordination needs related to Substance abuse issues -  2 ppd smoker wanting to quit. 05-21-2021: States that she is now a 1 ppd smoker so she has cut back.  Chronic Disease Management support and education needs related to HTN, HLD, COPD, DMII, and Anxiety with Excessive Worry, Social Anxiety,, Depression: depressed mood anxiety disturbed sleep, and Grief Lacks caregiver support.  Non-adherence to scheduled provider appointments Non-adherence to prescribed medication regimen  RNCM Clinical Goal(s):  Patient will verbalize understanding of plan for management of HTN, HLD, COPD, DMII, Anxiety, Depression, and Tobacco Use  verbalize basic understanding of HTN, HLD, COPD, DMII, Anxiety, Depression, and Tobacco Use disease process and  self health management plan  take all medications exactly as prescribed and will call provider for medication related questions demonstrate understanding of rationale for each prescribed medication and take as prescribed  attend all scheduled medical appointments: 07-17-2021 at 240 pm with the pcp  demonstrate improved and ongoing adherence to prescribed treatment plan for HTN, HLD, COPD, DMII, Anxiety, Depression, and Tobacco Use as evidenced by daily monitoring and recording of CBG  adherence to ADA/ carb modified diet adherence to prescribed medication regimen contacting provider for new or worsened symptoms or questions and working with the CCM team to optimize health and well being demonstrate improved and ongoing health management independence  continue to work with Consulting civil engineer and/or Social Worker to address care management and care coordination needs related to HTN, HLD, COPD, DMII, Anxiety, Depression, and Tobacco Use  demonstrate a decrease in HTN, HLD, COPD, DMII, Anxiety, Depression, and Tobacco Use exacerbations  demonstrate ongoing self health care management ability and effective management of chronic diseases through collaboration with RN Care manager, provider, and care team.   Interventions: 1:1 collaboration with primary care provider regarding development and update of comprehensive plan of care as evidenced by provider attestation and co-signature Inter-disciplinary care team collaboration (see longitudinal plan of care) Evaluation of current treatment plan related to  self management and patient's adherence to plan  as established by provider   SDOH Barriers (Status: Goal on track: YES.)  Patient interviewed and SDOH assessment performed        SDOH Interventions    Flowsheet Row Most Recent Value  SDOH Interventions   Food Insecurity Interventions Intervention Not Indicated  Financial Strain Interventions Intervention Not Indicated  Housing Interventions Intervention  Not Indicated  Intimate Partner Violence Interventions Intervention Not Indicated  Physical Activity Interventions Other (Comments)  [no structured activity]  Stress Interventions Other (Comment)  [wants to work on smoking cessation]  Social Connections Interventions Other (Comment)  [good family support]  Transportation Interventions Intervention Not Indicated  Depression Interventions/Treatment  --  [the patient lost her son in 2016. The patient smokes 2 ppd, working with LCSW, trying to take ownership of her health and well being]     Patient interviewed and appropriate assessments performed Discussed plans with patient for ongoing care management follow up and provided patient with direct contact information for care management team Advised patient to call the office for changed in [place] or needs that the patient may need assistance with  Assisted patient/caregiver with obtaining information about health plan benefits- 01-15-2021: Review of OTC benefits and if the patient has an OTC benefit plan    COPD: (Status: Goal on track: YES.) Reviewed medications with patient, including use of prescribed maintenance and rescue inhalers, and provided instruction on medication management and the importance of adherence. 05-21-2021: States compliance with medications. Has her medications.  Provided patient with basic written and verbal COPD education on self care/management/and exacerbation prevention. 01-15-2021: The patient had a CT screening today to evaluate for lung cancer or other issues; Advised patient to track and manage COPD triggers. 01-15-2021: Knows that smoking cessation will help her with her COPD exacerbation. Has Chantix and wants to set a start date for smoking cessation. 05-21-2021: The patient has cut back on her smoking to 1 ppd. She states that she had a time last week with her allergies but she is better today. She states that she does have changes in her breathing sometimes when the  weather changes. Review of possible triggers she could be experiencing or could have;  Provided written and verbal instructions on pursed lip breathing and utilized returned demonstration as teach back; Provided instruction about proper use of medications used for management of COPD including inhalers; Advised patient to self assesses COPD action plan zone and make appointment with provider if in the yellow zone for 48 hours without improvement. 05-21-2021: The patient is using supplemental oxygen at night and states this is very helpful for her and she feels more rested when she gets up each morning.  Advised patient to engage in light exercise as tolerated 3-5 days a week to aid in the the management of COPD; Provided education about and advised patient to utilize infection prevention strategies to reduce risk of respiratory infection; Discussed the importance of adequate rest and management of fatigue with COPD;  Diabetes:  (Status: Goal on Track (progressing): YES.) Lab Results  Component Value Date   HGBA1C 7.7 (A) 09/07/2019   HGBA1C 7.7 09/07/2019   HGBA1C 7.7 (A) 09/07/2019  At endocrinologist office on 01-12-2021: A1C was 7.8 At endocrinologist office on 04-27-2021: A1C was 7.6 Assessed patient's understanding of A1c goal: <7% Provided education to patient about basic DM disease process; Reviewed medications with patient and discussed importance of medication adherence. 05-21-2021: The patient states compliance with her medications;        Reviewed prescribed diet with  patient heart healthy/ADA diet. 05-21-2021: The patient is compliant with heart healthy diet, review of healthy eating habits; Counseled on importance of regular laboratory monitoring as prescribed. 01-15-2021: Recent lab work with endocrinologist on 01-12-2021 with A1C of 7.8. 05-21-2021: Lab work on 04-27-2021 at the endocrinology office A1C was 7.6        Discussed plans with patient for ongoing care management follow up and  provided patient with direct contact information for care management team;      Provided patient with written educational materials related to hypo and hyperglycemia and importance of correct treatment. 05-21-2021: States the lowest she has seen is 90 and the highest she has seen is 160. She is trying to check it more frequently.        Reviewed scheduled/upcoming provider appointments including: 07-17-2021 with pcp at 240 pm;         Advised patient, providing education and rationale, to check cbg as directed  and record. 05-21-2021: Review of checking glucose on a regular basis. Review of fasting goal of <130 and post prandial of <180. Patient states she has upcoming appointments to see the endocrinologist.       call provider for findings outside established parameters;       Review of patient status, including review of consultants reports, relevant laboratory and other test results, and medications completed;        Depression and Anxiety  (Status: Goal on track: YES.) Evaluation of current treatment plan related to Anxiety and Depression, Mental Health Concerns  self-management and patient's adherence to plan as established by provider. 05-21-2021: The patient feels she is doing good. She is excited about a trip she and her boyfriend are going to in March to the beach.  She is still trying to find a new clinic to go to that her insurance with cover. She states that she is working on it but ask if the pcp could refill her klonopin one more time because she was going to run out before securing and appointment. In basket message sent to the pcp and will ask the pcp for recommendations. Will follow up accordingly after collaboration with the pcp.  Discussed plans with patient for ongoing care management follow up and provided patient with direct contact information for care management team Advised patient to call the office for changes in mood, depression or anxiety; Provided education to patient re:  triggers that could cause exacerbation of depression and anxiety, also about the griefshare program since she lost her son in 2016. She states this has negatively impacted her health and she is really trying to do much better. 05-21-2021: The patient is in good spirits today and denies any acute findings. The patient states that she is cutting back on her smoking and feels she is doing well overall.  Reviewed medications with patient and discussed compliance/ 05-21-2021: States compliance with medications. Did ask for help with additional Klonopin refill since she has not secured a new clinic yet. In basket message sent to the pcp for recommendations; Provided patient with anxiety and depression educational materials related to effective measures in managing anxiety and depression. Has MyChart and is receptive to eductional information; Reviewed scheduled/upcoming provider appointments including 07-17-2021 at 240 pm; Social Work referral for ongoing support and education of depression and anxiety, currently established and working with the [employer]; Pharmacy referral for ongoing support and education for medication management and medications needs, the patient is currenlty established with the pharm D and working with the [employer]  D for needs; Discussed plans with patient for ongoing care management follow up and provided patient with direct contact information for care management team; Advised patient to discuss insomnia and to write down questions to ask the provider and discuss these with provider; Screening for signs and symptoms of depression related to chronic disease state;  Assessed social determinant of health barriers;   Hyperlipidemia:  (Status: Goal on track: NO.) Lab Results  Component Value Date   CHOL 205 (H) 01/04/2019   HDL 40 (L) 01/04/2019   LDLCALC 146 (H) 01/04/2019   TRIG 87 01/04/2019   CHOLHDL 5.1 (H) 01/04/2019     Medication review performed; medication list updated in electronic  medical record. 05-21-2021 takes Lipitor 20 mg QD Provider established cholesterol goals reviewed; Counseled on importance of regular laboratory monitoring as prescribed. 05-21-2021: Needs updated lipid panel. Education and support given.  Provided HLD educational materials; Reviewed role and benefits of statin for ASCVD risk reduction; Discussed strategies to manage statin-induced myalgias; Reviewed importance of limiting foods high in cholesterol: 05-21-2021: The patient states compliance with heart healthy/ADA diet  Screening for signs and symptoms of depression related to chronic disease state;  Assessed social determinant of health barriers;   Hypertension: (Status: Goal on track: YES.) Last practice recorded BP readings:  BP Readings from Last 3 Encounters:  04/19/21 108/63  01/16/21 115/71  10/10/20 120/66  Most recent eGFR/CrCl: No results found for: EGFR  No components found for: CRCL  Evaluation of current treatment plan related to hypertension self management and patient's adherence to plan as established by provider. 05-21-2021: The patient is doing well with blood pressures and HTN;   Provided education to patient re: stroke prevention, s/s of heart attack and stroke; Reviewed prescribed diet heart healthy/ADA diet. 05-21-2021: The patient is compliant with heart healthy/ADA diet Reviewed medications with patient and discussed importance of compliance. 05-21-2021: The patient is compliant with medications;  Counseled on adverse effects of illicit drug and excessive alcohol use in patients with high blood pressure;  Discussed plans with patient for ongoing care management follow up and provided patient with direct contact information for care management team; Advised patient, providing education and rationale, to monitor blood pressure daily and record, calling PCP for findings outside established parameters;  Reviewed scheduled/upcoming provider appointments including:  Provided  education on prescribed diet Heart healthy/ADA;  Discussed complications of poorly controlled blood pressure such as heart disease, stroke, circulatory complications, vision complications, kidney impairment, sexual dysfunction;   Smoking Cessation: (Status: Goal on track: NO.) Reviewed smoking history:  tobacco abuse of 38 plus years; currently smoking 2 ppd ( down from 4 ppd). 05-21-2021: The patient is down to 1 ppd Previous quit attempts, unsuccessful 0 successful using 0  Reports smoking within 30 minutes of waking up Reports triggers to smoke include: grief process and death of her son in June 10, 2014. Stressors in life- elderly father sick and in SNF, family stressors, relationship stressors, worried about her health Reports motivation to quit smoking includes: knows it will be beneficial for her health and she will be able to breath better  On a scale of 1-10, reports MOTIVATION to quit is 7 On a scale of 1-10, reports CONFIDENCE in quitting is 5  Evaluation of current treatment plan reviewed; Advised patient to discuss smoking cessation options with provider; Provided contact information for [place] [facility]uit [facility] (1-800-QUIT-NOW); Reviewed smoking cessation techniques: removing cigarettes and smoking materials from environment, stress management, substitution of other forms of reinforcement, support of family/friends,  written materials, and pharmacotherapy (has Chantix but has not started taking yet, wants to set a start date, likely after Christmas 2022) Provided patient with printed smoking cessation educational materials; Reviewed scheduled/upcoming provider appointments including: 07-17-2021 at 240 pm; Discussed plans with patient for ongoing care management follow up and provided patient with direct contact information for care management team;  Patient Goals/Self-Care Activities: Patient will self administer medications as prescribed as evidenced by self report/primary caregiver report  Patient  will attend all scheduled provider appointments as evidenced by clinician review of documented attendance to scheduled appointments and patient/caregiver report Patient will call pharmacy for medication refills as evidenced by patient report and review of pharmacy fill history as appropriate Patient will continue to perform ADL's independently as evidenced by patient/caregiver report Patient will continue to perform IADL's independently as evidenced by patient/caregiver report Patient will call provider office for new concerns or questions as evidenced by review of documented incoming telephone call notes and patient report Patient will work with BSW to address care coordination needs and will continue to work with the clinical team to address health care and disease management related needs as evidenced by documented adherence to scheduled care management/care coordination appointments - schedule appointment with eye doctor, - check blood sugar at prescribed times: before meals and at bedtime and when you have symptoms of low or high blood sugar, - check feet daily for cuts, sores or redness, - enter blood sugar readings and medication or insulin into daily log, - take the blood sugar log to all doctor visits, - trim toenails straight across, - drink 6 to 8 glasses of water each day, - eat fish at least once per week, - fill half of plate with vegetables, - limit fast food meals to no more than 1 per week, - manage portion size, - prepare main meal at home 3 to 5 days each week, - read food labels for fat, fiber, carbohydrates and portion size, - reduce red meat to 2 to 3 times a week, - switch to sugar-free drinks, - keep feet up while sitting, - wash and dry feet carefully every day, - wear comfortable, cotton socks, and - wear comfortable, well-fitting shoes - eliminate smoking in my home, - identify and avoid work-related triggers, - identify and remove indoor air pollutants, - limit outdoor activity  during cold weather, - listen for public air quality announcements every day, - do breathing exercises every day, - eliminate symptom triggers at home, - follow rescue plan if symptoms flare-up, - keep follow-up appointments: with pcp and specialist, - use an extra pillow to sleep, - develop a new routine to improve sleep, - eat healthy/prescribed diet: heart healthy/ADA, - get at least 7 to 8 hours of sleep at night, - use devices that will help like a cane, sock-puller or reacher, - practice relaxation or meditation daily, - do breathing exercises at least 2 times each day, and - do exercises in a comfortable position that makes breathing as easy as possible - check blood pressure 3 times per week, - choose a place to take my blood pressure (home, clinic or office, retail store), - write blood pressure results in a log or diary, - learn about high blood pressure, - keep a blood pressure log, - take blood pressure log to all doctor appointments, - call doctor for signs and symptoms of high blood pressure, - develop an action plan for high blood pressure, - keep all doctor appointments, - take medications for blood  pressure exactly as prescribed, - report new symptoms to your doctor, and - eat more whole grains, fruits and vegetables, lean meats and healthy fats - call for medicine refill 2 or 3 days before it runs out, - take all medications exactly as prescribed, - call doctor with any symptoms you believe are related to your medicine, - call doctor when you experience any new symptoms, - go to all doctor appointments as scheduled, and - adhere to prescribed diet: heart healthy/ADA diet        Plan:Telephone follow up appointment with care management team member scheduled for:  07-30-2021 at 230 pm  [place], MSN, [place] Mobile: [phone]

## 2021-05-22 ENCOUNTER — Other Ambulatory Visit: Payer: Self-pay | Admitting: Internal Medicine

## 2021-05-22 MED ORDER — CLONAZEPAM 0.5 MG PO TABS: 0.5000 mg | ORAL_TABLET | Freq: Every day | ORAL | 0 refills | Status: DC | PRN

## 2021-05-23 ENCOUNTER — Ambulatory Visit: Payer: Medicare HMO | Admitting: Pharmacist

## 2021-05-23 DIAGNOSIS — Z794 Long term (current) use of insulin: Secondary | ICD-10-CM

## 2021-05-23 DIAGNOSIS — E1169 Type 2 diabetes mellitus with other specified complication: Secondary | ICD-10-CM

## 2021-05-23 NOTE — Patient Instructions (Signed)
Visit Information  Thank you for taking time to visit with me today. Please don't hesitate to contact me if I can be of assistance to you before our next scheduled telephone appointment.  Following are the goals we discussed today:   Goals Addressed             This Visit's Progress    Pharmacy Goals       Our goal A1c is less than 7%. This corresponds with fasting sugars less than 130 and 2 hour after meal sugars less than 180. Please check your blood sugar each morning  Our goal bad cholesterol, or LDL, is less than 70 . This is why it is important to continue taking your atorvastatin  Feel free to call me with any questions or concerns. I look forward to our next call!   Wallace Cullens, PharmD, [place] [phone]         Our next appointment is by telephone on 05/28/2021 at 9:15 AM  Please call the care guide team at [phone] if you need to cancel or reschedule your appointment.    Patient verbalizes understanding of instructions and care plan provided today and agrees to view in [place]. Active MyChart status confirmed with patient.

## 2021-05-23 NOTE — Chronic Care Management (AMB) (Signed)
Chronic Care Management CCM Pharmacy Note  05/23/2021 Name:  Brandi Calderon MRN:  [id number] DOB:  11-14-1965  Subjective: Brandi Calderon is an 56 y.o. year old female who is a primary patient of [name], NP.  The CCM team was consulted for assistance with disease management and care coordination needs.    Engaged with patient by telephone for follow up visit for pharmacy case management and/or care coordination services.   Objective:  Medications Reviewed Today     Reviewed by Vanita Ingles, RN (Case Manager) on 05/21/21 at 1614  Med List Status: <None>   Medication Order Taking? Sig Documenting Provider Last Dose Status Informant  albuterol (VENTOLIN HFA) 108 (90 Base) MCG/ACT inhaler [id number] No INHALE 1-2 PUFFS BY MOUTH EVERY 6 HOURS AS NEEDED FOR WHEEZE OR SHORTNESS OF BREATH [name], [name], NP Taking Active   Aspirin-Salicylamide-Caffeine (BC HEADACHE POWDER PO) [id number] No Take 4 packets by mouth daily as needed (pain). [provider] Taking Active   atorvastatin (LIPITOR) 20 MG tablet [id number] No Take 1 tablet (20 mg total) by mouth daily at 6 PM. [name], NP Taking Active   Blood Glucose Monitoring Suppl [facility]) w/Device KIT [id number] No 1 Device by Does not apply route daily. [name], NP Taking Active   clonazePAM (KLONOPIN) 0.5 MG tablet [id number]  Take 1 tablet (0.5 mg total) by mouth daily as needed for anxiety. [name], NP  Active   DULoxetine (CYMBALTA) 60 MG capsule [id number] No Take 60 mg by mouth daily. [provider] Taking Active   fluticasone (FLONASE) 50 MCG/ACT nasal spray [id number]  SPRAY 2 SPRAYS INTO EACH NOSTRIL EVERY DAY [name], NP  Active   furosemide (LASIX) 40 MG tablet [id number] No TAKE 1 TABLET BY MOUTH EVERY DAY [name], [name], NP Taking Active   gabapentin (NEURONTIN) 300 MG capsule [id number] No Take by mouth 3 (three) times daily as needed. [provider]  Taking Active   glipiZIDE (GLUCOTROL XL) 5 MG 24 hr tablet [id number]  Take 5 mg by mouth daily. [provider]  Active   glucose blood test strip [id number] No Use as instructed [name], NP Taking Active   JARDIANCE 25 MG TABS tablet [id number] No Take 25 mg by mouth daily. [provider] Taking Active   Lancets [facility] ULTRASOFT) lancets [id number] No Use as instructed [name], NP Taking Active   medroxyPROGESTERone (PROVERA) 10 MG tablet [id number] No Take 1 tablet (10 mg total) by mouth daily. [name], NP Taking Active   metFORMIN (GLUCOPHAGE) 1000 MG tablet [id number] No TAKE 1 TABLET (1,000 MG TOTAL) BY MOUTH 2 (TWO) TIMES DAILY WITH A MEAL. [name], NP Taking Active   OXYGEN [id number] No Inhale 1 L into the lungs at bedtime. [provider] Taking Active   pantoprazole (PROTONIX) 40 MG tablet [id number]  TAKE 1 TABLET BY MOUTH EVERY DAY [name], NP  Active   TRELEGY ELLIPTA 100-62.5-25 MCG/ACT AEPB [id number] No Inhale 1 puff into the lungs daily. Olin Hauser, DO Taking Active   TRULICITY 4.5 FE/0.7HQ SOPN [id number] No Inject 0.5 mLs (4.5 mg total) subcutaneously every 7 (seven) daysInject 0.5 mLs (4.5 mg total) subcutaneously every 7 (seven) days [name], [name], NP Taking Active   Med List Note [name], RN 06/25/18 4982): UDS 06-25-2018            Pertinent Labs:  Lab  Results  Component Value Date   HGBA1C 7.7 (A) 09/07/2019   HGBA1C 7.7 09/07/2019   HGBA1C 7.7 (A) 09/07/2019  Latest A1C per shared record from [place]: 7.6% on 04/27/2021  Lab Results  Component Value Date   CREATININE 0.83 09/11/2020   BUN 14 09/11/2020   NA 139 09/11/2020   K 3.9 09/11/2020   CL 96 (L) 09/11/2020   CO2 33 (H) 09/11/2020    SDOH:  (Social Determinants of Health) assessments and interventions performed:    [place]  Review of patient past medical history, allergies,  medications, health status, including review of consultants reports, laboratory and other test data, was performed as part of comprehensive evaluation and provision of chronic care management services.   Care Plan : General Pharmacy (Adult)  Updates made by [name], RPH-CPP since 05/23/2021 12:00 AM     Problem: Disease Progression      Long-Range Goal: Disease Progression Prevented or Minimized   Start Date: 03/06/2020  Expected End Date: 06/04/2020  Recent Progress: On track  Priority: High  Note:   Current Barriers:  Unable to self administer medications as prescribed Lack of blood sugar readings for clinical team  Pharmacist Clinical Goal(s):  Over the next 90 days, patient will achieve adherence to monitoring guidelines and medication adherence to achieve therapeutic efficacy. through collaboration with PharmD and provider.   Interventions: Inter-disciplinary care team collaboration (see longitudinal plan of care) 1:1 collaboration with [name], NP regarding development and update of comprehensive plan of care as evidenced by provider attestation and co-signature Perform chart review Patient seen for Office Visit with [facility] Endocrinology on 04/27/2021 A1C 7.6% Provider advised patient: Instead of Trulicity, you will take VICTOZA 1.2 mg daily for one week and then adjust to 1.8 mg daily. Note patient had reported difficulty with obtaining Trulicity from local pharmacy due to national back order Office Visit with PCP on 1/19. Provider advised: Start taking Wellbutrin every other day x 1 week, then every 2 days x 1 week, then stop Continue Duloxetine Clonazepam refilled Referral to psychiatry for medication management Reach patient by telephone, but patient is not home. Schedule time to talk this afternoon, but unable to reach patient x2. Reach patient again, but unable to perform comprehensive medication review today. Schedule time to complete this next  week  Type 2 Diabetes: Current treatment:  Metformin 1000 mg twice daily Trulicity 4.5 mg weekly Jardiance 25 mg daily Glipizide ER 5 mg daily with breakfast From review of chart, note glipizide ER 5 mg - Take 1 tablet (5 mg total) by mouth daily with breakfast ordered by Endocrinologist on 01/15/2021 Patient reports received sample of Victoza from Endocrinologist at last appointment with instructions for use until able to pick up and restart Trulicity. However, reports never started Victoza as picked up Trulicity from pharmacy the next day Reports recent home fasting blood sugar readings ranging: 90-110 Denies recent hypoglycemia Encourage patient to continue monitoring home blood sugar readings, keep log of results and have this to review at medical appointments   Patient Goals/Self-Care Activities Over the next 90 days, patient will:  Focus on medication adherence by using weekly pillbox as adherence tool Check blood glucose in morning and at bedtime and bring meter to future appointments with Endocrinologist Attend medical appointments as scheduled  Follow Up Plan: Telephone follow up appointment with care management team member scheduled for: 05/28/2021 at [place], PharmD, BCACP,  [place] [phone]

## 2021-05-25 DIAGNOSIS — J449 Chronic obstructive pulmonary disease, unspecified: Secondary | ICD-10-CM | POA: Diagnosis not present

## 2021-05-28 ENCOUNTER — Ambulatory Visit: Payer: Medicare HMO | Admitting: Pharmacist

## 2021-05-28 DIAGNOSIS — Z794 Long term (current) use of insulin: Secondary | ICD-10-CM

## 2021-05-28 DIAGNOSIS — E1169 Type 2 diabetes mellitus with other specified complication: Secondary | ICD-10-CM

## 2021-05-28 NOTE — Chronic Care Management (AMB) (Signed)
Chronic Care Management CCM Pharmacy Note  05/28/2021 Name:  Brandi Calderon MRN:  [id number] DOB:  [birth year]   Subjective: Brandi Calderon is an 56 y.o. year old female who is a primary patient of Brandi Fenton, NP.  The CCM team was consulted for assistance with disease management and care coordination needs.    Engaged with patient by telephone for follow up visit for pharmacy case management and/or care coordination services.   Objective:  Medications Reviewed Today     Reviewed by [name], RPH-CPP (Pharmacist) on 05/28/21 at 1634  Med List Status: <None>   Medication Order Taking? Sig Documenting Provider Last Dose Status Informant  albuterol (VENTOLIN HFA) 108 (90 Base) MCG/ACT inhaler [id number] Yes INHALE 1-2 PUFFS BY MOUTH EVERY 6 HOURS AS NEEDED FOR WHEEZE OR SHORTNESS OF BREATH [name], [name], NP Taking Active   Aspirin-Salicylamide-Caffeine (BC HEADACHE POWDER PO) [id number] No Take 4 packets by mouth daily as needed (pain).  Patient not taking: Reported on 05/28/2021   [provider] Not Taking Active   atorvastatin (LIPITOR) 20 MG tablet [id number] Yes Take 1 tablet (20 mg total) by mouth daily at 6 PM. Brandi Fenton, NP Taking Active   Blood Glucose Monitoring Suppl [facility]) w/Device KIT [id number]  1 Device by Does not apply route daily. Brandi Fenton, NP  Active   clonazePAM (KLONOPIN) 0.5 MG tablet [id number] Yes Take 1 tablet (0.5 mg total) by mouth daily as needed for anxiety. Brandi Fenton, NP Taking Active   DULoxetine (CYMBALTA) 60 MG capsule [id number] Yes Take 60 mg by mouth daily. [provider] Taking Active   fluticasone (FLONASE) 50 MCG/ACT nasal spray [id number] Yes SPRAY 2 SPRAYS INTO EACH NOSTRIL EVERY DAY Brandi Fenton, NP Taking Active   furosemide (LASIX) 40 MG tablet [id number] Yes TAKE 1 TABLET BY MOUTH EVERY DAY [name], [name], NP Taking Active   gabapentin (NEURONTIN) 300 MG capsule [id number] Yes  Take by mouth 3 (three) times daily as needed. [provider] Taking Active   glipiZIDE (GLUCOTROL XL) 5 MG 24 hr tablet [id number] Yes Take 5 mg by mouth daily. [provider] Taking Active   glucose blood test strip [id number]  Use as instructed Brandi Fenton, NP  Active   JARDIANCE 25 MG TABS tablet [id number] Yes Take 25 mg by mouth daily. [provider] Taking Active   Lancets [facility] ULTRASOFT) lancets [id number]  Use as instructed Brandi Fenton, NP  Active   metFORMIN (GLUCOPHAGE) 1000 MG tablet [id number] Yes TAKE 1 TABLET (1,000 MG TOTAL) BY MOUTH 2 (TWO) TIMES DAILY WITH A MEAL. Brandi Fenton, NP Taking Active   OXYGEN [id number]  Inhale 1 L into the lungs at bedtime. [provider]  Active   pantoprazole (PROTONIX) 40 MG tablet [id number] Yes TAKE 1 TABLET BY MOUTH EVERY DAY Brandi Fenton, NP Taking Active   TRELEGY ELLIPTA 100-62.5-25 MCG/ACT AEPB [id number] Yes Inhale 1 puff into the lungs daily. Olin Hauser, DO Taking Active   TRULICITY 4.5 JI/9.6VE SOPN [id number] Yes Inject 0.5 mLs (4.5 mg total) subcutaneously every 7 (seven) daysInject 0.5 mLs (4.5 mg total) subcutaneously every 7 (seven) days [name], [name], NP Taking Active   Med List Note [name], RN 06/25/18 0258): UDS 06-25-2018            Pertinent Labs:  Lab Results  Component Value Date   HGBA1C 7.7 (A) 09/07/2019   HGBA1C  7.7 09/07/2019   HGBA1C 7.7 (A) 09/07/2019  Latest A1C per shared record from [name]: 7.6% on 04/27/2021  Lab Results  Component Value Date   CHOL 205 (H) 01/04/2019   HDL 40 (L) 01/04/2019   LDLCALC 146 (H) 01/04/2019   TRIG 87 01/04/2019   CHOLHDL 5.1 (H) 01/04/2019  Latest LDL per shared record from [name]: 80 mg/dL on 01/12/2021  Lab Results  Component Value Date   CREATININE 0.83 09/11/2020   BUN 14 09/11/2020   NA 139 09/11/2020   K 3.9 09/11/2020   CL 96 (L) 09/11/2020    CO2 33 (H) 09/11/2020    SDOH:  (Social Determinants of Health) assessments and interventions performed:    [place]  Review of patient past medical history, allergies, medications, health status, including review of consultants reports, laboratory and other test data, was performed as part of comprehensive evaluation and provision of chronic care management services.   Care Plan : General Pharmacy (Adult)  Updates made by [name], RPH-CPP since 05/28/2021 12:00 AM     Problem: Disease Progression      Long-Range Goal: Disease Progression Prevented or Minimized   Start Date: 03/06/2020  Expected End Date: 06/04/2020  Recent Progress: On track  Priority: High  Note:   Current Barriers:  Unable to self administer medications as prescribed Lack of blood sugar readings for clinical team  Pharmacist Clinical Goal(s):  Over the next 90 days, patient will achieve adherence to monitoring guidelines and medication adherence to achieve therapeutic efficacy. through collaboration with PharmD and provider.   Interventions: Inter-disciplinary care team collaboration (see longitudinal plan of care) 1:1 collaboration with [name], NP regarding development and update of comprehensive plan of care as evidenced by provider attestation and co-signature Comprehensive medication review performed; medication list updated in electronic medical record Type 2 Diabetes: Current treatment:  Metformin 1000 mg twice daily Trulicity 4.5 mg weekly Jardiance 25 mg daily Glipizide ER 5 mg daily with breakfast Reports recent home fasting blood sugar readings ranging: 90-110; today after lunch: 188 Denies recent s/s of hypoglycemia Counsel on strategies to improve comfort with blood sugar testing Encourage patient to continue monitoring home blood sugar readings, keep log of results and have this to review at medical appointments  Tobacco use: Reports has reduced smoking to ~ 1  pack/day Interested in quitting smoking, but not ready to set quit date today Reports family is her motivation for quitting. States her daughters have told her that they would quit smoking with her Triggers: boredom, others smoking around her Strategy: playing games online to help with boredom Reports picked up and going to try nicotine patches Step 1 (21 mg/day) and is going to start these tomorrow     Medication Adherence: Patient previously expressed interest in using pill packaging to manage medications, but reports now doing well with using weekly pillbox Reports also uses daily phone alarm as medication adherence tool (as setup by her daughter)   COPD: Current treatment: Trelegy - 1 puff daily Albuterol - 1-2 puffs every 6 hours as needed Overnight oxygen Reports using Trelegy inhaler daily as directed, rinsing mouth after each use and using rescue (albuterol) inhaler as needed as directed    Patient Goals/Self-Care Activities Over the next 90 days, patient will:  Focus on medication adherence by using weekly pillbox as adherence tool Check blood glucose in morning and at bedtime and bring meter to future appointments with Endocrinologist Attend medical  appointments as scheduled  Follow Up Plan: Telephone follow up appointment with care management team member scheduled for: 06/25/2021 at 8:30 AM      Wallace Cullens, PharmD, [place], [place] [phone]

## 2021-05-28 NOTE — Patient Instructions (Signed)
Visit Information  Thank you for taking time to visit with me today. Please don't hesitate to contact me if I can be of assistance to you before our next scheduled telephone appointment.  Following are the goals we discussed today:   Goals Addressed             This Visit's Progress    Pharmacy Goals       Our goal A1c is less than 7%. This corresponds with fasting sugars less than 130 and 2 hour after meal sugars less than 180. Please check your blood sugar each morning  Our goal bad cholesterol, or LDL, is less than 70 . This is why it is important to continue taking your atorvastatin  Feel free to call me with any questions or concerns. I look forward to our next call!  Wallace Cullens, PharmD, [place] [phone]         Our next appointment is by telephone on 06/25/2021 at 8:30 AM  Please call the care guide team at [phone] if you need to cancel or reschedule your appointment.    Patient verbalizes understanding of instructions and care plan provided today and agrees to view in [place]. Active MyChart status confirmed with patient.

## 2021-05-29 DIAGNOSIS — F419 Anxiety disorder, unspecified: Secondary | ICD-10-CM | POA: Diagnosis not present

## 2021-05-29 DIAGNOSIS — E1169 Type 2 diabetes mellitus with other specified complication: Secondary | ICD-10-CM

## 2021-05-29 DIAGNOSIS — Z794 Long term (current) use of insulin: Secondary | ICD-10-CM | POA: Diagnosis not present

## 2021-05-29 DIAGNOSIS — E118 Type 2 diabetes mellitus with unspecified complications: Secondary | ICD-10-CM | POA: Diagnosis not present

## 2021-05-29 DIAGNOSIS — F331 Major depressive disorder, recurrent, moderate: Secondary | ICD-10-CM

## 2021-05-29 DIAGNOSIS — F32A Depression, unspecified: Secondary | ICD-10-CM

## 2021-05-29 DIAGNOSIS — E785 Hyperlipidemia, unspecified: Secondary | ICD-10-CM | POA: Diagnosis not present

## 2021-05-29 DIAGNOSIS — J449 Chronic obstructive pulmonary disease, unspecified: Secondary | ICD-10-CM | POA: Diagnosis not present

## 2021-05-29 DIAGNOSIS — R69 Illness, unspecified: Secondary | ICD-10-CM | POA: Diagnosis not present

## 2021-05-29 DIAGNOSIS — I10 Essential (primary) hypertension: Secondary | ICD-10-CM | POA: Diagnosis not present

## 2021-05-29 DIAGNOSIS — J418 Mixed simple and mucopurulent chronic bronchitis: Secondary | ICD-10-CM

## 2021-05-30 ENCOUNTER — Ambulatory Visit: Payer: Self-pay | Admitting: *Deleted

## 2021-05-30 DIAGNOSIS — S86911A Strain of unspecified muscle(s) and tendon(s) at lower leg level, right leg, initial encounter: Secondary | ICD-10-CM | POA: Diagnosis not present

## 2021-05-30 DIAGNOSIS — M25562 Pain in left knee: Secondary | ICD-10-CM | POA: Diagnosis not present

## 2021-05-30 DIAGNOSIS — S86912A Strain of unspecified muscle(s) and tendon(s) at lower leg level, left leg, initial encounter: Secondary | ICD-10-CM | POA: Diagnosis not present

## 2021-05-30 NOTE — Telephone Encounter (Signed)
Will review UC/ED note ?

## 2021-05-30 NOTE — Telephone Encounter (Signed)
Reason for Disposition ?? Unable to walk ? ?Answer Assessment - Initial Assessment Questions ?1. MECHANISM: "How did the injury happen?" (e.g., twisting injury, direct blow)  ?    Does not remember injuring leg ?2. ONSET: "When did the injury happen?" (Minutes or hours ago)  ?    Saturday ?3. LOCATION: "Where is the injury located?"  ?    R leg- hip down to knee and calve ?4. APPEARANCE of INJURY: "What does the injury look like?"  (e.g., deformity of leg) ?    No swelling ?5. SEVERITY: "Can you put weight on that leg?" "Can you walk?"  ?    no ?6. SIZE: For cuts, bruises, or swelling, ask: "How large is it?" (e.g., inches or centimeters)  ?    none ?7. PAIN: "Is there pain?" If Yes, ask: "How bad is the pain?"   "What does it keep you from doing?" (e.g., Scale 1-10; or mild, moderate, severe) ?  -  NONE: (0): no pain ?  -  MILD (1-3): doesn't interfere with normal activities  ?  -  MODERATE (4-7): interferes with normal activities (e.g., work or school) or awakens from sleep, limping  ?  -  SEVERE (8-10): excruciating pain, unable to do any normal activities, unable to walk ?    severe ?8. TETANUS: For any breaks in the skin, ask: "When was the last tetanus booster?" ?    N/a ?9. OTHER SYMPTOMS: "Do you have any other symptoms?"  ?    no ?10. PREGNANCY: "Is there any chance you are pregnant?" "When was your last menstrual period?" ?      *No Answer* ? ?Protocols used: Leg Pain-A-AH, Leg Injury-A-AH ? ?

## 2021-05-30 NOTE — Telephone Encounter (Signed)
?  Chief Complaint: R leg pain ?Symptoms: pain from hip to knee and calve ?Frequency: started Saturday ?Pertinent Negatives: Patient denies knowledge of injury ?Disposition: [x] ED /[] Urgent Care (no appt availability in office) / [] Appointment(In office/virtual)/ []  [place] [facility]/ [] Home Care/ [] Refused Recommended Disposition /[] [place] [facility]/ []  Follow-up with PCP ?Additional Notes: Refusing ED if disposition- called office for work in appointment. No open appointment- patient advised ED- stressed importance of going- patient states she will go- advised 911 for chest pain, breathing difficulty ?

## 2021-06-05 ENCOUNTER — Encounter: Payer: Self-pay | Admitting: *Deleted

## 2021-06-05 DIAGNOSIS — Z9981 Dependence on supplemental oxygen: Secondary | ICD-10-CM | POA: Insufficient documentation

## 2021-06-07 ENCOUNTER — Other Ambulatory Visit: Payer: Self-pay | Admitting: Internal Medicine

## 2021-06-07 NOTE — Telephone Encounter (Signed)
Requested medication (s) are due for refill today: yes ? ?Requested medication (s) are on the active medication list: yes ? ?Last refill:  05/11/21 ? ?Future visit scheduled: yes ? ?Notes to clinic:  Unable to refill per protocol, cannot delegate. ? ? ? ?  ?Requested Prescriptions  ?Pending Prescriptions Disp Refills  ? fluticasone (FLONASE) 50 MCG/ACT nasal spray [Pharmacy Med Name: FLUTICASONE PROP 50 MCG SPRAY] 16 mL 0  ?  Sig: SPRAY 2 SPRAYS INTO EACH NOSTRIL EVERY DAY  ?  ? Not Delegated - Ear, Nose, and Throat: Nasal Preparations - Corticosteroids Failed - 06/07/2021  8:34 AM  ?  ?  Failed - This refill cannot be delegated  ?  ?  Passed - Valid encounter within last 12 months  ?  Recent Outpatient Visits   ? ?      ? 1 month ago Need for immunization against influenza  ? [facility] [place], [name], NP  ? 4 months ago Medicare annual wellness visit, subsequent  ? [facility] [place], [place] W, NP  ? 10 months ago Type 2 diabetes mellitus with complication, with long-term current use of insulin ([place])  ? [facility] [place], [place] W, NP  ? 11 months ago Fatigue, unspecified type  ? [facility] Kathrine Haddock, NP  ? 1 year ago Urinary frequency  ? [facility], [name], FNP  ? ?  ?  ?Future Appointments   ? ?        ? In 1 month [name], [name], NP [facility], [place]  ? In 1 month [name], [name], NP [facility], [place]  ? ?  ? ?  ?  ?  ? ?

## 2021-06-13 ENCOUNTER — Other Ambulatory Visit: Payer: Self-pay | Admitting: Internal Medicine

## 2021-06-13 DIAGNOSIS — U071 COVID-19: Secondary | ICD-10-CM

## 2021-06-13 DIAGNOSIS — R0602 Shortness of breath: Secondary | ICD-10-CM

## 2021-06-14 ENCOUNTER — Ambulatory Visit (INDEPENDENT_AMBULATORY_CARE_PROVIDER_SITE_OTHER): Payer: Medicare HMO | Admitting: Gastroenterology

## 2021-06-14 ENCOUNTER — Encounter: Payer: Self-pay | Admitting: Gastroenterology

## 2021-06-14 VITALS — BP 118/70 | HR 73 | Ht 61.0 in | Wt 128.0 lb

## 2021-06-14 DIAGNOSIS — Z9981 Dependence on supplemental oxygen: Secondary | ICD-10-CM

## 2021-06-14 DIAGNOSIS — Z86010 Personal history of colonic polyps: Secondary | ICD-10-CM | POA: Diagnosis not present

## 2021-06-14 MED ORDER — NA SULFATE-K SULFATE-MG SULF 17.5-3.13-1.6 GM/177ML PO SOLN: 1.0000 | Freq: Once | ORAL | 0 refills | Status: AC

## 2021-06-14 NOTE — Patient Instructions (Signed)
If you are age 54 or older, your body mass index should be between 23-30. Your Body mass index is 24.19 kg/m. If this is out of the aforementioned range listed, please consider follow up with your Primary Care Provider.  If you are age 64 or younger, your body mass index should be between 19-25. Your Body mass index is 24.19 kg/m. If this is out of the aformentioned range listed, please consider follow up with your Primary Care Provider.   ________________________________________________________  The [facility] providers would like to encourage you to use MYCHART to communicate with providers for non-urgent requests or questions.  Due to long hold times on the telephone, sending your provider a message by MYCHART may be a faster and more efficient way to get a response.  Please allow 48 business hours for a response.  Please remember that this is for non-urgent requests.  _______________________________________________________  You have been scheduled for a colonoscopy. Please follow written instructions given to you at your visit today.  Please pick up your prep supplies at the pharmacy within the next 1-3 days. If you use inhalers (even only as needed), please bring them with you on the day of your procedure.  

## 2021-06-14 NOTE — Progress Notes (Signed)
? ? ? ?06/14/2021 ?Maretta Los Franzoni ?[id number] ?1965/12/03 ? ? ?HISTORY OF PRESENT ILLNESS: This is a 56 year old female who is a patient of Dr. Tarri Glenn.  She is here today to discuss and schedule surveillance colonoscopy.  Last colonoscopy 05/2018: ? ?- One 11 mm polyp in the descending colon, removed with a hot snare. Resected and retrieved. ?- One 4 mm polyp in the sigmoid colon, removed with a cold snare. Resected and retrieved. ?- One 3 mm polyp in the rectum, removed with a cold snare. Resected and retrieved. ?- The examination was otherwise normal on direct and retroflexion views. ? ?Surgical [P], rectum, sigmoid and descending, polyp (3) ?- TUBULAR ADENOMA(S). ?- HYPERPLASTIC POLYP. ?- HIGH GRADE DYSPLASIA IS NOT IDENTIFIED. ? ?She is not having any GI complaints.  Says she moves her bowels regularly.  No rectal bleeding.  No abdominal pain.  She is now on a liter of oxygen just at nighttime since she had COVID back in the fall. ? ?Past Medical History:  ?Diagnosis Date  ? Anxiety   ? Arthritis   ? Asthma   ? Back pain   ? COPD (chronic obstructive pulmonary disease) ([place])   ? Depression   ? Diabetes mellitus   ? Headache   ? History of COVID-19   ? Hyperlipidemia   ? Hypertension   ? Tendonitis   ? ?Past Surgical History:  ?Procedure Laterality Date  ? CESAREAN SECTION    ? CESAREAN SECTION    ? 3 times  ? cyst on neck    ? as a baby/right side  ? ? reports that she has been smoking cigarettes. She has a 58.50 pack-year smoking history. She has never used smokeless tobacco. She reports current drug use. Drug: Marijuana. She reports that she does not drink alcohol. ?family history includes Dementia in her father; Diabetes in her brother, brother, father, mother, and sister; Kidney disease in her father. ?Allergies  ?Allergen Reactions  ? Penicillins Anaphylaxis  ?  Has patient had a PCN reaction causing immediate rash, facial/tongue/throat swelling, SOB or lightheadedness with hypotension: Yes ?Has patient  had a PCN reaction causing severe rash involving mucus membranes or skin necrosis: No ?Has patient had a PCN reaction that required hospitalization: No ?Has patient had a PCN reaction occurring within the last 10 years: No ?If all of the above answers are "NO", then may proceed with Cephalosporin use. ? ? ?Throat swells  ? Victoza [Liraglutide]   ?  Severe nausea  ? Vicodin [Hydrocodone-Acetaminophen] Rash  ?  And hives  ? ? ?  ?Outpatient Encounter Medications as of 06/14/2021  ?Medication Sig  ? albuterol (VENTOLIN HFA) 108 (90 Base) MCG/ACT inhaler INHALE 1-2 PUFFS BY MOUTH EVERY 6 HOURS AS NEEDED FOR WHEEZE OR SHORTNESS OF BREATH  ? Aspirin-Salicylamide-Caffeine (BC HEADACHE POWDER PO) Take 4 packets by mouth daily as needed (pain).  ? atorvastatin (LIPITOR) 20 MG tablet Take 1 tablet (20 mg total) by mouth daily at 6 PM.  ? Blood Glucose Monitoring Suppl (ONETOUCH ULTRALINK) w/Device KIT 1 Device by Does not apply route daily.  ? clonazePAM (KLONOPIN) 0.5 MG tablet Take 1 tablet (0.5 mg total) by mouth daily as needed for anxiety.  ? fluticasone (FLONASE) 50 MCG/ACT nasal spray SPRAY 2 SPRAYS INTO EACH NOSTRIL EVERY DAY  ? furosemide (LASIX) 40 MG tablet TAKE 1 TABLET BY MOUTH EVERY DAY  ? gabapentin (NEURONTIN) 300 MG capsule Take by mouth 3 (three) times daily as needed.  ? glipiZIDE (GLUCOTROL XL)  5 MG 24 hr tablet Take 5 mg by mouth daily.  ? glucose blood test strip Use as instructed  ? JARDIANCE 25 MG TABS tablet Take 25 mg by mouth daily.  ? Lancets (ONETOUCH ULTRASOFT) lancets Use as instructed  ? metFORMIN (GLUCOPHAGE) 1000 MG tablet TAKE 1 TABLET (1,000 MG TOTAL) BY MOUTH 2 (TWO) TIMES DAILY WITH A MEAL.  ? OXYGEN Inhale 1 L into the lungs at bedtime.  ? pantoprazole (PROTONIX) 40 MG tablet TAKE 1 TABLET BY MOUTH EVERY DAY  ? TRELEGY ELLIPTA 100-62.5-25 MCG/ACT AEPB Inhale 1 puff into the lungs daily.  ? TRULICITY 4.5 DZ/3.2DJ SOPN Inject 0.5 mLs (4.5 mg total) subcutaneously every 7 (seven) daysInject  0.5 mLs (4.5 mg total) subcutaneously every 7 (seven) days  ? [DISCONTINUED] DULoxetine (CYMBALTA) 60 MG capsule Take 60 mg by mouth daily.  ? ?No facility-administered encounter medications on file as of 06/14/2021.  ? ? ? ?REVIEW OF SYSTEMS  : All other systems reviewed and negative except where noted in the History of Present Illness. ? ? ?PHYSICAL EXAM: ?BP 118/70   Pulse 73   Ht 5' 1"  (1.549 m)   Wt 128 lb (58.1 kg)   BMI 24.19 kg/m?  ?General: Well developed white female in no acute distress ?Head: Normocephalic and atraumatic ?Eyes:  Sclerae anicteric, conjunctiva pink. ?Ears: Normal auditory acuity ?Lungs: Clear throughout to auscultation; no W/R/R. ?Heart: Regular rate and rhythm; no M/R/G. ?Abdomen: Soft, non-distended.  BS present.  Non-tender. ?Rectal:  Will be done at the time of colonoscopy. ?Musculoskeletal: Symmetrical with no gross deformities  ?Skin: No lesions on visible extremities ?Extremities: No edema  ?Neurological: Alert oriented x 4, grossly non-focal ?Psychological:  Alert and cooperative. Normal mood and affect ? ?ASSESSMENT AND PLAN: ?*Personal history of colon polyps: Had an 11 cm tubular adenoma removed in February 2020.  Repeat recommended at 3-year interval.  We will schedule with Dr. Tarri Glenn at [facility] due to nighttime oxygen use.  The risks, benefits, and alternatives to colonoscopy were discussed with the patient and she consents to proceed.  ? ? ? ?CC:  [name], NP ? ?  ?

## 2021-06-14 NOTE — Telephone Encounter (Signed)
Requested Prescriptions  ?Pending Prescriptions Disp Refills  ?? albuterol (VENTOLIN HFA) 108 (90 Base) MCG/ACT inhaler [Pharmacy Med Name: ALBUTEROL HFA (PROVENTIL) INH] 6.7 each 1  ?  Sig: INHALE 1-2 PUFFS BY MOUTH EVERY 6 HOURS AS NEEDED FOR WHEEZE OR SHORTNESS OF BREATH  ?  ? Pulmonology:  Beta Agonists 2 Passed - 06/13/2021  6:16 PM  ?  ?  Passed - Last BP in normal range  ?  BP Readings from Last 1 Encounters:  ?04/19/21 108/63  ?   ?  ?  Passed - Last Heart Rate in normal range  ?  Pulse Readings from Last 1 Encounters:  ?04/19/21 77  ?   ?  ?  Passed - Valid encounter within last 12 months  ?  Recent Outpatient Visits   ?      ? 1 month ago Need for immunization against influenza  ? [facility] [place], [name], NP  ? 4 months ago Medicare annual wellness visit, subsequent  ? [facility] [place], [place] W, NP  ? 10 months ago Type 2 diabetes mellitus with complication, with long-term current use of insulin ([place])  ? [facility] [place], [place] W, NP  ? 1 year ago Fatigue, unspecified type  ? [facility] Kathrine Haddock, NP  ? 1 year ago Urinary frequency  ? [facility], [name], FNP  ?  ?  ?Future Appointments   ?        ? In 1 month [name], [name], NP [facility], [place]  ? In 1 month [name], [name], NP [facility] [place]  ?  ? ?  ?  ?  ? ?

## 2021-06-14 NOTE — Progress Notes (Signed)
Reviewed and agree with management plans. ? ?Bernal Luhman L. Jasimine Simms, MD, MPH  ?

## 2021-06-22 DIAGNOSIS — J449 Chronic obstructive pulmonary disease, unspecified: Secondary | ICD-10-CM | POA: Diagnosis not present

## 2021-06-24 ENCOUNTER — Other Ambulatory Visit: Payer: Self-pay | Admitting: Internal Medicine

## 2021-06-25 ENCOUNTER — Ambulatory Visit (INDEPENDENT_AMBULATORY_CARE_PROVIDER_SITE_OTHER): Payer: Medicare HMO | Admitting: Pharmacist

## 2021-06-25 DIAGNOSIS — J449 Chronic obstructive pulmonary disease, unspecified: Secondary | ICD-10-CM

## 2021-06-25 DIAGNOSIS — F17200 Nicotine dependence, unspecified, uncomplicated: Secondary | ICD-10-CM

## 2021-06-25 DIAGNOSIS — E1169 Type 2 diabetes mellitus with other specified complication: Secondary | ICD-10-CM

## 2021-06-25 NOTE — Chronic Care Management (AMB) (Signed)
? ?Chronic Care Management ?CCM Pharmacy Note ? ?06/25/2021 ?Name:  Brandi Calderon MRN:  [id number] DOB:  1965-04-21 ? ? ?Subjective: ?Brandi Calderon is an 56 y.o. year old female who is a primary patient of [name], NP.  The CCM team was consulted for assistance with disease management and care coordination needs.   ? ?Engaged with patient by telephone for follow up visit for pharmacy case management and/or care coordination services.  ? ?Objective: ? ?Medications Reviewed Today   ? ? Reviewed by [name], RPH-CPP (Pharmacist) on 06/25/21 at 0910  Med List Status: <None>  ? ?Medication Order Taking? Sig Documenting Provider Last Dose Status Informant  ?albuterol (VENTOLIN HFA) 108 (90 Base) MCG/ACT inhaler [id number] Yes INHALE 1-2 PUFFS BY MOUTH EVERY 6 HOURS AS NEEDED FOR WHEEZE OR SHORTNESS OF BREATH [name], [name], NP Taking Active   ?Aspirin-Salicylamide-Caffeine (BC HEADACHE POWDER PO) [id number]  Take 4 packets by mouth daily as needed (pain). [provider]  Active   ?atorvastatin (LIPITOR) 20 MG tablet [id number]  Take 1 tablet (20 mg total) by mouth daily at 6 PM. [name], NP  Active   ?Blood Glucose Monitoring Suppl [facility] ULTRALINK) w/Device KIT [id number]  1 Device by Does not apply route daily. [name], NP  Active   ?clonazePAM (KLONOPIN) 0.5 MG tablet [id number]  Take 1 tablet (0.5 mg total) by mouth daily as needed for anxiety. [name], NP  Active   ?fluticasone [facility]) 50 MCG/ACT nasal spray [id number]  SPRAY 2 SPRAYS INTO EACH NOSTRIL EVERY DAY [name], NP  Active   ?furosemide (LASIX) 40 MG tablet [id number]  TAKE 1 TABLET BY MOUTH EVERY DAY [name], [name], NP  Active   ?gabapentin (NEURONTIN) 300 MG capsule [id number]  Take by mouth 3 (three) times daily as needed. [provider]  Active   ?glipiZIDE (GLUCOTROL XL) 5 MG 24 hr tablet [id number] Yes Take 5 mg by mouth daily. [provider] Taking Active   ?glucose  blood test strip [id number]  Use as instructed [name], NP  Active   ?JARDIANCE 25 MG TABS tablet [id number] Yes Take 25 mg by mouth daily. [provider] Taking Active   ?Lancets [facility] ULTRASOFT) lancets [id number]  Use as instructed [name], NP  Active   ?metFORMIN (GLUCOPHAGE) 1000 MG tablet [id number] Yes TAKE 1 TABLET (1,000 MG TOTAL) BY MOUTH 2 (TWO) TIMES DAILY WITH A MEAL. [name], NP Taking Active   ?OXYGEN [id number]  Inhale 1 L into the lungs at bedtime. [provider]  Active   ?pantoprazole (PROTONIX) 40 MG tablet [id number]  TAKE 1 TABLET BY MOUTH EVERY DAY [name], [name], NP  Active   ?TRELEGY ELLIPTA 100-62.5-25 MCG/ACT AEPB [id number] Yes Inhale 1 puff into the lungs daily. Olin Hauser, DO Taking Active   ?TRULICITY 4.5 BO/1.7PZ SOPN [id number] Yes Inject 0.5 mLs (4.5 mg total) subcutaneously every 7 (seven) daysInject 0.5 mLs (4.5 mg total) subcutaneously every 7 (seven) days [name], NP Taking Active   ?Med List Note [name], RN 06/25/18 2423): UDS 06-25-2018  ? ?  ?  ? ?  ? ? ?Pertinent Labs:  ?Lab Results  ?Component Value Date  ? HGBA1C 7.7 (A) 09/07/2019  ? HGBA1C 7.7 09/07/2019  ? HGBA1C 7.7 (A) 09/07/2019  ?Per shared record from [place] A1C 7.6% on 04/27/2021 ? ?Lab Results  ?Component Value Date  ?  CREATININE 0.83 09/11/2020  ? BUN 14 09/11/2020  ? NA 139 09/11/2020  ? K 3.9 09/11/2020  ? CL 96 (L) 09/11/2020  ? CO2 33 (H) 09/11/2020  ? ? ?SDOH:  (Social Determinants of Health) assessments and interventions performed:  ? ? ?CCM Care Plan ? ?Review of patient past medical history, allergies, medications, health status, including review of consultants reports, laboratory and other test data, was performed as part of comprehensive evaluation and provision of chronic care management services.  ? ?Care Plan : General Pharmacy (Adult)  ?Updates made by [name], RPH-CPP since 06/25/2021 12:00 AM  ?   ? ?Problem: Disease Progression   ?  ? ?Long-Range Goal: Disease Progression Prevented or Minimized   ?Start Date: 03/06/2020  ?Expected End Date: 06/04/2020  ?Recent Progress: On track  ?Priority: High  ?Note:   ?Current Barriers:  ?Unable to self administer medications as prescribed ?Lack of blood sugar readings for clinical team ? ?Pharmacist Clinical Goal(s):  ?Over the next 90 days, patient will achieve adherence to monitoring guidelines and medication adherence to achieve therapeutic efficacy. through collaboration with PharmD and provider.  ? ?Interventions: ?Inter-disciplinary care team collaboration (see longitudinal plan of care) ?1:1 collaboration with [name], NP regarding development and update of comprehensive plan of care as evidenced by provider attestation and co-signature ?Perform chart review. Patient seen for Office Visit with [facility] on 06/14/2021 ?Patient scheduled for colonoscopy on 08/16/2021 ? ?Type 2 Diabetes: ?Patient followed by [facility] Endocrinology for management of T2DM ?Current treatment:  ?Metformin 1000 mg twice daily ?Trulicity 4.5 mg weekly on Mondays ?Jardiance 25 mg daily ?Glipizide ER 5 mg daily with breakfast ?Recalls recent home fasting blood sugar readings ranging: 70s-110 ?Denies s/s of hypoglycemia ?Counsel patient on symptoms of hypoglycemia and management of lows ?Reports has glucose tablets. Encourage patient to carry these with her ?Discuss importance of having well-balanced meals throughout the day, rather than skipping meals ?Exercise: going to gym to walk on treadmill for ~5 minutes x 2-3 times/week ?Trying to get exercise bike for house ?Encourage patient to continue monitoring home blood sugar readings, keep log of results and have this to review at medical appointments and to call Endocrinologist sooner for reading outside of established parameters, particularly for any episodes of hypoglycemia ? ?Tobacco use: ?Reports currently smoking  to ~ 1.5 pack/day ?Interested in quitting smoking, but not ready to set quit date today ?Reports family is her motivation for quitting. States her daughters have told her that they would quit smoking with her ?Triggers: boredom, others smoking around her ?Strategy: playing games online to help with boredom ?Denies having started nicotine patches, but has Step 1 (21 mg/day) patches ?Have counseled on use of patches ?Encourage patient to reach out to [facility] Quitline for support  ?  ?Medication Adherence: ?Patient previously expressed interest in using pill packaging to manage medications, but reports now doing well with using weekly pillbox ?Reports also uses daily phone alarm as medication adherence tool (as setup by her daughter) ?  ?COPD: ?Current treatment: ?Trelegy - 1 puff daily ?Albuterol - 1-2 puffs every 6 hours as needed ?Overnight oxygen ?Reports using Trelegy inhaler daily as directed, rinsing mouth after each use and using rescue (albuterol) inhaler as needed as directed ?Identify patient in need of refill of albuterol inhaler. Encourage patient to contact pharmacy for refill ?  ? ?Patient Goals/Self-Care Activities ?Over the next 90 days, patient will:  ?Focus on medication adherence by using weekly pillbox as adherence tool ?  Check blood glucose in morning and at bedtime and bring meter to future appointments with Endocrinologist ?Attend medical appointments as scheduled ? ?Follow Up Plan: Telephone follow up appointment with care management team member scheduled for: 09/05/2021 at 10:00 AM ?  ?  ? ?Wallace Cullens, PharmD, BCACP, CPP ?Clinical Pharmacist ?[facility] ?[place] ?[phone] ? ? ? ? ? ?

## 2021-06-25 NOTE — Patient Instructions (Signed)
Visit Information ? ?Thank you for taking time to visit with me today. Please don't hesitate to contact me if I can be of assistance to you before our next scheduled telephone appointment. ? ?Following are the goals we discussed today:  ? Goals Addressed   ? ?  ?  ?  ?  ? This Visit's Progress  ?  Pharmacy Goals     ?  Our goal A1c is less than 7%. This corresponds with fasting sugars less than 130 and 2 hour after meal sugars less than 180. Please check your blood sugar each morning ? ?Our goal bad cholesterol, or LDL, is less than 70 . This is why it is important to continue taking your atorvastatin ? ?Please follow up with the [place] Quitline for their help with quitting smoking.  ?The [place] Quitline phone number is: [phone] ? ?Feel free to call me with any questions or concerns. I look forward to our next call! ? ? ?Wallace Cullens, PharmD, BCACP ?Clinical Pharmacist ?[facility] ?[place] ?[phone] ?  ? ?  ? ? ? ?Our next appointment is by telephone on 09/05/2021 at 10:00 AM ? ?Please call the care guide team at [phone] if you need to cancel or reschedule your appointment.  ? ? ?Patient verbalizes understanding of instructions and care plan provided today and agrees to view in [place]. Active MyChart status confirmed with patient.   ? ?

## 2021-06-26 NOTE — Telephone Encounter (Signed)
Requested medication (s) are due for refill today: Yes ? ?Requested medication (s) are on the active medication list: Yes ? ?Last refill:  09/28/20 ? ?Future visit scheduled: Yes ? ?Notes to clinic:  Protocol indicates pt. Needs lab work. ? ? ? ?Requested Prescriptions  ?Pending Prescriptions Disp Refills  ? furosemide (LASIX) 40 MG tablet [Pharmacy Med Name: FUROSEMIDE 40 MG TABLET] 30 tablet 1  ?  Sig: TAKE 1 TABLET BY MOUTH EVERY DAY  ?  ? Cardiovascular:  Diuretics - Loop Failed - 06/24/2021  1:32 PM  ?  ?  Failed - K in normal range and within 180 days  ?  Potassium  ?Date Value Ref Range Status  ?09/11/2020 3.9 3.5 - 5.1 mmol/L Final  ?12/07/2012 3.4 (L) 3.5 - 5.1 mmol/L Final  ?  ?  ?  ?  Failed - Ca in normal range and within 180 days  ?  Calcium  ?Date Value Ref Range Status  ?09/11/2020 9.5 8.9 - 10.3 mg/dL Final  ? ?Calcium, Total  ?Date Value Ref Range Status  ?12/07/2012 8.6 8.5 - 10.1 mg/dL Final  ? ?Calcium, Ion  ?Date Value Ref Range Status  ?05/09/2010 1.11 (L) 1.12 - 1.32 mmol/L Final  ?  ?  ?  ?  Failed - Na in normal range and within 180 days  ?  Sodium  ?Date Value Ref Range Status  ?09/11/2020 139 135 - 145 mmol/L Final  ?06/25/2017 139 134 - 144 mmol/L Final  ?12/07/2012 134 (L) 136 - 145 mmol/L Final  ?  ?  ?  ?  Failed - Cr in normal range and within 180 days  ?  Creat  ?Date Value Ref Range Status  ?09/07/2019 0.81 0.50 - 1.05 mg/dL Final  ?  Comment:  ?  For patients >35 years of age, the reference limit ?for Creatinine is approximately 13% higher for people ?identified as African-American. ?. ?  ? ?Creatinine, Ser  ?Date Value Ref Range Status  ?09/11/2020 0.83 0.44 - 1.00 mg/dL Final  ?  ?  ?  ?  Failed - Cl in normal range and within 180 days  ?  Chloride  ?Date Value Ref Range Status  ?09/11/2020 96 (L) 98 - 111 mmol/L Final  ?12/07/2012 103 98 - 107 mmol/L Final  ?  ?  ?  ?  Failed - Mg Level in normal range and within 180 days  ?  Magnesium  ?Date Value Ref Range Status  ?09/05/2016  1.4 (L) 1.7 - 2.4 mg/dL Final  ?  ?  ?  ?  Passed - Last BP in normal range  ?  BP Readings from Last 1 Encounters:  ?06/14/21 118/70  ?  ?  ?  ?  Passed - Valid encounter within last 6 months  ?  Recent Outpatient Visits   ? ?      ? 2 months ago Need for immunization against influenza  ? [facility] [place], [name], NP  ? 5 months ago Medicare annual wellness visit, subsequent  ? [facility] [place], [place] W, NP  ? 11 months ago Type 2 diabetes mellitus with complication, with long-term current use of insulin ([place])  ? [facility] [place], [place] W, NP  ? 1 year ago Fatigue, unspecified type  ? [facility] Kathrine Haddock, NP  ? 1 year ago Urinary frequency  ? [facility], [name], FNP  ? ?  ?  ?  Future Appointments   ? ?        ? In 3 weeks [name], [name], NP [facility], [place]  ? In 3 weeks [name], [name], NP [facility], [place]  ? ?  ? ?  ?  ?  ? ?

## 2021-06-29 DIAGNOSIS — J449 Chronic obstructive pulmonary disease, unspecified: Secondary | ICD-10-CM

## 2021-06-29 DIAGNOSIS — Z794 Long term (current) use of insulin: Secondary | ICD-10-CM

## 2021-06-29 DIAGNOSIS — F17210 Nicotine dependence, cigarettes, uncomplicated: Secondary | ICD-10-CM

## 2021-06-29 DIAGNOSIS — E1169 Type 2 diabetes mellitus with other specified complication: Secondary | ICD-10-CM

## 2021-07-03 ENCOUNTER — Ambulatory Visit (INDEPENDENT_AMBULATORY_CARE_PROVIDER_SITE_OTHER): Payer: Medicare HMO | Admitting: *Deleted

## 2021-07-03 ENCOUNTER — Other Ambulatory Visit: Payer: Self-pay | Admitting: Internal Medicine

## 2021-07-03 DIAGNOSIS — J449 Chronic obstructive pulmonary disease, unspecified: Secondary | ICD-10-CM

## 2021-07-03 DIAGNOSIS — E1169 Type 2 diabetes mellitus with other specified complication: Secondary | ICD-10-CM

## 2021-07-03 DIAGNOSIS — F32A Depression, unspecified: Secondary | ICD-10-CM

## 2021-07-03 DIAGNOSIS — R5382 Chronic fatigue, unspecified: Secondary | ICD-10-CM

## 2021-07-03 DIAGNOSIS — E1140 Type 2 diabetes mellitus with diabetic neuropathy, unspecified: Secondary | ICD-10-CM

## 2021-07-03 DIAGNOSIS — G894 Chronic pain syndrome: Secondary | ICD-10-CM

## 2021-07-03 MED ORDER — CLONAZEPAM 0.5 MG PO TABS: 0.5000 mg | ORAL_TABLET | Freq: Every day | ORAL | 0 refills | Status: DC | PRN

## 2021-07-03 NOTE — Patient Instructions (Signed)
Visit Information ? ?Thank you for taking time to visit with me today. Please don't hesitate to contact me if I can be of assistance to you before our next scheduled telephone appointment. ? ?Following are the goals we discussed today:  ?Discuss meds, Psychiatry consult and Pain Clinic consult with PCP at visit on 07/17/21 ? ?Our next appointment is by telephone   ? ?Please call the care guide team at [phone] if you need to cancel or reschedule your appointment.  ? ?If you are experiencing a Mental Health or [place] or need someone to talk to, please call 1-800-273-TALK (toll free, 24 hour hotline) ?call 911 ?988   ? ?The patient verbalized understanding of instructions, educational materials, and care plan provided today and declined offer to receive copy of patient instructions, educational materials, and care plan.  ? ?Eduard Clos MSW, LCSW ?Licensed Clinical Social Worker ?[employer] Coverage    ?[phone]  ?

## 2021-07-03 NOTE — Chronic Care Management (AMB) (Signed)
?Chronic Care Management  ? ? Clinical Social Work Note ? ?07/03/2021 ?Name: Brandi Calderon MRN: [id number] DOB: 07/11/1965 ? ?Brandi Calderon is a 56 y.o. year old female who is a primary care patient of [name], NP. The CCM team was consulted to assist the patient with chronic disease management and/or care coordination needs related to: Mental Health Counseling and Resources.  ? ?Engaged with patient by telephone for  coverage follow up  in response to provider referral for social work chronic care management and care coordination services.  ? ?Consent to Services:  ?The patient was given information about Chronic Care Management services, agreed to services, and gave verbal consent prior to initiation of services.  Please see initial visit note for detailed documentation.  ? ?Patient agreed to services and consent obtained.  ? ?Assessment: Review of patient past medical history, allergies, medications, and health status, including review of relevant consultants reports was performed today as part of a comprehensive evaluation and provision of chronic care management and care coordination services.    ? ?SDOH (Social Determinants of Health) assessments and interventions performed:   ? ?Advanced Directives Status: Not addressed in this encounter. ? ?CCM Care Plan ? ?Allergies  ?Allergen Reactions  ? Penicillins Anaphylaxis  ?  Has patient had a PCN reaction causing immediate rash, facial/tongue/throat swelling, SOB or lightheadedness with hypotension: Yes ?Has patient had a PCN reaction causing severe rash involving mucus membranes or skin necrosis: No ?Has patient had a PCN reaction that required hospitalization: No ?Has patient had a PCN reaction occurring within the last 10 years: No ?If all of the above answers are "NO", then may proceed with Cephalosporin use. ? ? ?Throat swells  ? Victoza [Liraglutide]   ?  Severe nausea  ? Vicodin [Hydrocodone-Acetaminophen] Rash  ?  And hives  ? ? ?Outpatient  Encounter Medications as of 07/03/2021  ?Medication Sig  ? albuterol (VENTOLIN HFA) 108 (90 Base) MCG/ACT inhaler INHALE 1-2 PUFFS BY MOUTH EVERY 6 HOURS AS NEEDED FOR WHEEZE OR SHORTNESS OF BREATH  ? Aspirin-Salicylamide-Caffeine (BC HEADACHE POWDER PO) Take 4 packets by mouth daily as needed (pain).  ? atorvastatin (LIPITOR) 20 MG tablet Take 1 tablet (20 mg total) by mouth daily at 6 PM.  ? Blood Glucose Monitoring Suppl (ONETOUCH ULTRALINK) w/Device KIT 1 Device by Does not apply route daily.  ? clonazePAM (KLONOPIN) 0.5 MG tablet Take 1 tablet (0.5 mg total) by mouth daily as needed for anxiety.  ? fluticasone (FLONASE) 50 MCG/ACT nasal spray SPRAY 2 SPRAYS INTO EACH NOSTRIL EVERY DAY  ? furosemide (LASIX) 40 MG tablet TAKE 1 TABLET BY MOUTH EVERY DAY  ? gabapentin (NEURONTIN) 300 MG capsule Take by mouth 3 (three) times daily as needed.  ? glipiZIDE (GLUCOTROL XL) 5 MG 24 hr tablet Take 5 mg by mouth daily.  ? glucose blood test strip Use as instructed  ? JARDIANCE 25 MG TABS tablet Take 25 mg by mouth daily.  ? Lancets (ONETOUCH ULTRASOFT) lancets Use as instructed  ? metFORMIN (GLUCOPHAGE) 1000 MG tablet TAKE 1 TABLET (1,000 MG TOTAL) BY MOUTH 2 (TWO) TIMES DAILY WITH A MEAL.  ? OXYGEN Inhale 1 L into the lungs at bedtime.  ? pantoprazole (PROTONIX) 40 MG tablet TAKE 1 TABLET BY MOUTH EVERY DAY  ? TRELEGY ELLIPTA 100-62.5-25 MCG/ACT AEPB Inhale 1 puff into the lungs daily.  ? TRULICITY 4.5 TK/2.4OX SOPN Inject 0.5 mLs (4.5 mg total) subcutaneously every 7 (seven) daysInject 0.5 mLs (4.5 mg  total) subcutaneously every 7 (seven) days  ? ?No facility-administered encounter medications on file as of 07/03/2021.  ? ? ?Patient Active Problem List  ? Diagnosis Date Noted  ? History of colonic polyps 06/14/2021  ? Oxygen dependent 06/05/2021  ? Snoring 08/30/2020  ? Neutrophilia 08/30/2020  ? Smoker 08/30/2020  ? Mixed simple and mucopurulent chronic bronchitis ([place]) 07/26/2020  ? Gastroesophageal reflux disease without  esophagitis 07/26/2020  ? Hyperlipidemia associated with type 2 diabetes mellitus ([place]) 05/24/2020  ? Anxiety and depression 07/23/2018  ? Chronic pain syndrome 07/23/2018  ? Neuropathy due to type 2 diabetes mellitus ([place]) - severe 12/19/2015  ? Type 2 diabetes mellitus with other specified complication ([place]) 82/42/3536  ? ? ?Conditions to be addressed/monitored: Anxiety and Depression; Mental Health Concerns  ? ?Care Plan : General Social Work (Adult)  ?Updates made by Deirdre Peer, LCSW since 07/03/2021 12:00 AM  ?  ? ?Problem: Response to Treatment (Depression)   ?  ? ?Long-Range Goal: Response to Treatment Maximized   ?Start Date: 05/23/2020  ?This Visit's Progress: On track  ?Recent Progress: On track  ?Priority: Medium  ?Note:   ?Current Barriers:  ?Chronic Mental Health needs related to Depression, Anxiety and Grief ?Limited social support ?Mental Health Concerns  ?Social Isolation ?Loss of child in 2016 ?Suicidal Ideation/Homicidal Ideation: No ?Clinical Social Work Goal(s):  ?Over the next 120 days, patient will work with SW to address concerns related to care coordination needs and lack of education/support/resource connection. LCSW will assist patient in gaining community resource education and additional support and resource connection as well in order to maintain health and mental health appropriately  ?Over the next 120 days, patient will demonstrate improved adherence to self care as evidenced by implementing healthy self-care into her daily routine such as: attending all medical appointments, deep breathing exercises, taking time for self-reflection, taking medications as prescribed, drinking water and daily exercise to improve mobility and mood.  ?Over the next 120 days, patient will work with SW bi-monthly by telephone or in person to reduce or manage symptoms related to stress, anxiety and grief. ?Over the next 120 days, patient will demonstrate improved health management independence as  evidenced by implementing healthy self-care skills and positive support/resources into her daily routine to help cope with stressors and improve overall health and well-being  ?Over the next 120 days, patient or caregiver will verbalize basic understanding of depression/stress process and self health management plan as evidenced by her participation in development of long term plan of care and institution of self health management strategies ?Interventions: ? ?07/03/21- This CSW covering spoke with pt who report she has run out of her Klonopin- some months I don't take them all and some times I do. CSW will alert PCP to pt request for refill. Pt is scheduled to see her PCP on 07/17/21 and will discuss the plan for a referral to psychiatrist.  She also plans to ask about a referral to Pain Clinic.  ?Pt denies any concerns related to depression and anxiety; "it comes and goes" and is reminded of the "988" hotline number to call if needed.  ? ?Patient interviewed and appropriate assessments performed: brief mental health assessment ?Patient reports symptoms of depression and anxiety are managed well currently. Triggers include chronic medical conditions and declining health of father (just initiated hospice) 11/08: Patient recently went to court due to a 50B against ex boyfriend. It was continued 11/15: Patient reports increase in stress triggered by father's death a couple  of months ago and current case where ex is suing her from $2,000  ?Patient continues participate in medication management with psychiatrist, Dr. List, through [facility] every two months. Patient states medications play a large role in management of symptoms. She was recently referred to therapy by Dr. List within [facility] and is expecting a call to schedule initial appointment 11/15: Patient does not receive services through [facility], as they no longer accept her insurance. CCM LCSW informed patient of local agencies that provide medication management. Patient agreed  to contact her insurance and obtain psychiatrists in-network 1/3: Patient reports that she would like to meet with PCP prior to a referral to psychiatrist. Her behavioral health medications have refills curr

## 2021-07-05 ENCOUNTER — Telehealth: Payer: Medicare HMO

## 2021-07-17 ENCOUNTER — Ambulatory Visit (INDEPENDENT_AMBULATORY_CARE_PROVIDER_SITE_OTHER): Payer: Medicare HMO | Admitting: Internal Medicine

## 2021-07-17 ENCOUNTER — Encounter: Payer: Self-pay | Admitting: Internal Medicine

## 2021-07-17 DIAGNOSIS — K219 Gastro-esophageal reflux disease without esophagitis: Secondary | ICD-10-CM | POA: Diagnosis not present

## 2021-07-17 DIAGNOSIS — G894 Chronic pain syndrome: Secondary | ICD-10-CM | POA: Diagnosis not present

## 2021-07-17 DIAGNOSIS — R69 Illness, unspecified: Secondary | ICD-10-CM | POA: Diagnosis not present

## 2021-07-17 DIAGNOSIS — E1140 Type 2 diabetes mellitus with diabetic neuropathy, unspecified: Secondary | ICD-10-CM

## 2021-07-17 DIAGNOSIS — J418 Mixed simple and mucopurulent chronic bronchitis: Secondary | ICD-10-CM | POA: Diagnosis not present

## 2021-07-17 DIAGNOSIS — Z794 Long term (current) use of insulin: Secondary | ICD-10-CM

## 2021-07-17 DIAGNOSIS — D729 Disorder of white blood cells, unspecified: Secondary | ICD-10-CM | POA: Diagnosis not present

## 2021-07-17 DIAGNOSIS — E785 Hyperlipidemia, unspecified: Secondary | ICD-10-CM

## 2021-07-17 DIAGNOSIS — E1169 Type 2 diabetes mellitus with other specified complication: Secondary | ICD-10-CM | POA: Diagnosis not present

## 2021-07-17 DIAGNOSIS — F32A Depression, unspecified: Secondary | ICD-10-CM

## 2021-07-17 DIAGNOSIS — F419 Anxiety disorder, unspecified: Secondary | ICD-10-CM

## 2021-07-17 NOTE — Assessment & Plan Note (Signed)
Continue Duloxetine and Gabapentin ?Encouraged daily stretching ?

## 2021-07-17 NOTE — Assessment & Plan Note (Signed)
Continue Gabapentin

## 2021-07-17 NOTE — Assessment & Plan Note (Signed)
Encourage smoking cessation ?Continue Trelegy ?

## 2021-07-17 NOTE — Assessment & Plan Note (Signed)
Stable on Duloxetine, Wellbutrin, Clonazepam and Hydroxyzine ?Support offered ?

## 2021-07-17 NOTE — Progress Notes (Signed)
? ?Subjective:  ? ? Patient ID: Brandi Calderon, female    DOB: 11-Nov-1965, 56 y.o.   MRN: [id number] ? ?HPI ? ?Patient presents to clinic today for 61-monthfollow-up of chronic conditions. ? ?DM 2 with Peripheral Neuropathy: Her last A1C was 7.6%, 04/2021. She is taking Metformin, Glipizide, Jardience and Trulicity as prescribed. She takes Gabapentin as prescribed for neuropathic pain. Her sugars range 60-110. She sees podiatry. Her last eye exam was 09/2020. Flu 04/2021. Pneumovax never. Covid Pfizer x 1. ? ?HLD: Her last LDL was 80, triglycerides 203, 12/2020. She denies myalgias on Atorvastatin and Lovaza. She tries to consume a low fat diet. ? ?Anxiety and Depression: Persistent, managed on Duloxetine, Wellbutrin, Clonazepam and Hydroxyzine. She is not currently seeing a therapist or psychiatrist. She denies SI/HI. ? ?Chronic Pain: Mainly in her spine and bilateral SI joints. Managed with Duloxetine and Gabapentin. She does not follow with pain management. ? ?GERD: Triggered by everything she eats. She denies breakthrough on Pantoprazole. There is no upper GI on file. ? ?COPD: She denies chronic cough or SOB. She is taking Trelegy as prescribed. There are no PFT's on file. She follows with pulmonology. She does smoke. ? ?Neutrophilia: Her last WBC counts was 10.4, 08/2020. She does smoke. She follows with hematology. ? ?Review of Systems ? ?   ?Past Medical History:  ?Diagnosis Date  ? Anxiety   ? Arthritis   ? Asthma   ? Back pain   ? COPD (chronic obstructive pulmonary disease) ([place]   ? Depression   ? Diabetes mellitus   ? Headache   ? History of COVID-19   ? Hyperlipidemia   ? Hypertension   ? Tendonitis   ? ? ?Current Outpatient Medications  ?Medication Sig Dispense Refill  ? albuterol (VENTOLIN HFA) 108 (90 Base) MCG/ACT inhaler INHALE 1-2 PUFFS BY MOUTH EVERY 6 HOURS AS NEEDED FOR WHEEZE OR SHORTNESS OF BREATH 6.7 each 1  ? Aspirin-Salicylamide-Caffeine (BC HEADACHE POWDER PO) Take 4 packets by mouth daily  as needed (pain).    ? atorvastatin (LIPITOR) 20 MG tablet Take 1 tablet (20 mg total) by mouth daily at 6 PM. 90 tablet 0  ? Blood Glucose Monitoring Suppl (ONETOUCH ULTRALINK) w/Device KIT 1 Device by Does not apply route daily. 1 kit 0  ? clonazePAM (KLONOPIN) 0.5 MG tablet Take 1 tablet (0.5 mg total) by mouth daily as needed for anxiety. 30 tablet 0  ? fluticasone (FLONASE) 50 MCG/ACT nasal spray SPRAY 2 SPRAYS INTO EACH NOSTRIL EVERY DAY 16 mL 5  ? furosemide (LASIX) 40 MG tablet TAKE 1 TABLET BY MOUTH EVERY DAY 30 tablet 0  ? gabapentin (NEURONTIN) 300 MG capsule Take by mouth 3 (three) times daily as needed.    ? glipiZIDE (GLUCOTROL XL) 5 MG 24 hr tablet Take 5 mg by mouth daily.    ? glucose blood test strip Use as instructed 100 each 0  ? JARDIANCE 25 MG TABS tablet Take 25 mg by mouth daily.    ? Lancets (ONETOUCH ULTRASOFT) lancets Use as instructed 100 each 12  ? metFORMIN (GLUCOPHAGE) 1000 MG tablet TAKE 1 TABLET (1,000 MG TOTAL) BY MOUTH 2 (TWO) TIMES DAILY WITH A MEAL. 60 tablet 0  ? OXYGEN Inhale 1 L into the lungs at bedtime.    ? pantoprazole (PROTONIX) 40 MG tablet TAKE 1 TABLET BY MOUTH EVERY DAY 90 tablet 1  ? TRELEGY ELLIPTA 100-62.5-25 MCG/ACT AEPB Inhale 1 puff into the lungs daily. 1 each 11  ?  TRULICITY 4.5 NO/0.3BC SOPN Inject 0.5 mLs (4.5 mg total) subcutaneously every 7 (seven) daysInject 0.5 mLs (4.5 mg total) subcutaneously every 7 (seven) days 15 mL 1  ? ?No current facility-administered medications for this visit.  ? ? ?Allergies  ?Allergen Reactions  ? Penicillins Anaphylaxis  ?  Has patient had a PCN reaction causing immediate rash, facial/tongue/throat swelling, SOB or lightheadedness with hypotension: Yes ?Has patient had a PCN reaction causing severe rash involving mucus membranes or skin necrosis: No ?Has patient had a PCN reaction that required hospitalization: No ?Has patient had a PCN reaction occurring within the last 10 years: No ?If all of the above answers are "NO",  then may proceed with Cephalosporin use. ? ? ?Throat swells  ? Victoza [Liraglutide]   ?  Severe nausea  ? Vicodin [Hydrocodone-Acetaminophen] Rash  ?  And hives  ? ? ?Family History  ?Problem Relation Age of Onset  ? Kidney disease Father   ? Diabetes Father   ? Dementia Father   ? Diabetes Sister   ? Diabetes Brother   ? Diabetes Mother   ? Diabetes Brother   ? ? ?Social History  ? ?Socioeconomic History  ? Marital status: Divorced  ?  Spouse name: Not on file  ? Number of children: Not on file  ? Years of education: Not on file  ? Highest education level: Not on file  ?Occupational History  ? Not on file  ?Tobacco Use  ? Smoking status: Every Day  ?  Packs/day: 1.50  ?  Years: 39.00  ?  Pack years: 58.50  ?  Types: Cigarettes  ? Smokeless tobacco: Never  ? Tobacco comments:  ?  2PPD 10/10/2020  ?Vaping Use  ? Vaping Use: Never used  ?Substance and Sexual Activity  ? Alcohol use: No  ? Drug use: Yes  ?  Types: Marijuana  ?  Comment: States she smokes MJ when she does not have any Tramadol.  ? Sexual activity: Not on file  ?Other Topics Concern  ? Not on file  ?Social History Narrative  ? ** Merged History Encounter ** In the process of getting SSD for depression, DM and neuropathy  ?   ? Active smoker [~ 2p/day; cutting down]; no alcohol; lives in [place]; self. Used to work in [employer]   ? ?Social Determinants of Health  ? ?Financial Resource Strain: Low Risk   ? Difficulty of Paying Living Expenses: Not hard at all  ?Food Insecurity: No Food Insecurity  ? Worried About Charity fundraiser in the Last Year: Never true  ? Ran Out of Food in the Last Year: Never true  ?Transportation Needs: No Transportation Needs  ? Lack of Transportation (Medical): No  ? Lack of Transportation (Non-Medical): No  ?Physical Activity: Inactive  ? Days of Exercise per Week: 0 days  ? Minutes of Exercise per Session: 0 min  ?Stress: No Stress Concern Present  ? Feeling of Stress : Only a little  ?Social Connections: Socially Isolated   ? Frequency of Communication with Friends and Family: More than three times a week  ? Frequency of Social Gatherings with Friends and Family: More than three times a week  ? Attends Religious Services: Never  ? Active Member of Clubs or Organizations: No  ? Attends Archivist Meetings: Never  ? Marital Status: Divorced  ?Intimate Partner Violence: Not At Risk  ? Fear of Current or Ex-Partner: No  ? Emotionally Abused: No  ? Physically Abused: No  ?  Sexually Abused: No  ? ? ? ?Constitutional: Denies fever, malaise, fatigue, headache or abrupt weight changes.  ?HEENT: Denies eye pain, eye redness, ear pain, ringing in the ears, wax buildup, runny nose, nasal congestion, bloody nose, or sore throat. ?Respiratory: Denies difficulty breathing, shortness of breath, cough or sputum production.   ?Cardiovascular: Denies chest pain, chest tightness, palpitations or swelling in the hands or feet.  ?Gastrointestinal: Denies abdominal pain, bloating, constipation, diarrhea or blood in the stool.  ?GU: Denies urgency, frequency, pain with urination, burning sensation, blood in urine, odor or discharge. ?Musculoskeletal: Pt reports chronic pain. Denies decrease in range of motion, difficulty with gait, or joint swelling.  ?Skin: Denies redness, rashes, lesions or ulcercations.  ?Neurological: Pt reports neuropathic pain. Denies dizziness, difficulty with memory, difficulty with speech or problems with balance and coordination.  ?Psych: Pt has a history of anxiety and depression. Denies SI/HI. ? ?No other specific complaints in a complete review of systems (except as listed in HPI above). ? ?Objective:  ? Physical Exam ? ?BP 126/80 (BP Location: Left Arm, Patient Position: Sitting, Cuff Size: Normal)   Pulse 79   Temp (!) 97.1 ?F (36.2 ?C) (Temporal)   Wt 132 lb (59.9 kg)   SpO2 100%   BMI 24.94 kg/m?  ? ?Wt Readings from Last 3 Encounters:  ?06/14/21 128 lb (58.1 kg)  ?04/19/21 131 lb 9.6 oz (59.7 kg)  ?01/16/21  131 lb 3.2 oz (59.5 kg)  ? ? ?General: Appears her stated age, well developed, well nourished in NAD. ?Skin: Warm, dry and intact. No ulcerations noted. ?HEENT: Head: normal shape and size; Eyes: sclera white

## 2021-07-17 NOTE — Patient Instructions (Signed)

## 2021-07-17 NOTE — Assessment & Plan Note (Signed)
She is about to have labs checked by endo ?Encouraged her to consume a low fat diet ?Continue Atorvastatin and Lovaza ?

## 2021-07-17 NOTE — Assessment & Plan Note (Signed)
Too soon for A1C ?Urine microalbumin checked by Endo ?Continue Metformin, Glipizide, Jardiance, Trulicity and Gabapentin ?We will request copy of her Eye exam ?Encourage routine foot exam ?Encourage low-carb diet ?Encouraged her to get a flu shot in the fall ?She declines Pneumovax ?Encouraged her to get her COVID booster ?

## 2021-07-17 NOTE — Assessment & Plan Note (Signed)
Resolved ?We will check CBC at next visit ?

## 2021-07-17 NOTE — Assessment & Plan Note (Signed)
Continue Pantoprazole.

## 2021-07-20 ENCOUNTER — Other Ambulatory Visit: Payer: Self-pay | Admitting: Internal Medicine

## 2021-07-20 NOTE — Telephone Encounter (Signed)
Can you call patient and ask her what she takes for her?  Swelling in her legs, blood pressure, heart failure ?

## 2021-07-20 NOTE — Telephone Encounter (Signed)
Requested medication (s) are due for refill today:   Yes ? ?Requested medication (s) are on the active medication list:   Yes ? ?Future visit scheduled:   Yes for Monday 4/24 ? ? ?Last ordered: 06/27/2021 #30, 0 refills ? ?Returned because labs are due per protocol.  ? ?Requested Prescriptions  ?Pending Prescriptions Disp Refills  ? furosemide (LASIX) 40 MG tablet [Pharmacy Med Name: FUROSEMIDE 40 MG TABLET] 30 tablet 0  ?  Sig: TAKE 1 TABLET BY MOUTH EVERY DAY  ?  ? Cardiovascular:  Diuretics - Loop Failed - 07/20/2021 11:30 AM  ?  ?  Failed - K in normal range and within 180 days  ?  Potassium  ?Date Value Ref Range Status  ?09/11/2020 3.9 3.5 - 5.1 mmol/L Final  ?12/07/2012 3.4 (L) 3.5 - 5.1 mmol/L Final  ?  ?  ?  ?  Failed - Ca in normal range and within 180 days  ?  Calcium  ?Date Value Ref Range Status  ?09/11/2020 9.5 8.9 - 10.3 mg/dL Final  ? ?Calcium, Total  ?Date Value Ref Range Status  ?12/07/2012 8.6 8.5 - 10.1 mg/dL Final  ? ?Calcium, Ion  ?Date Value Ref Range Status  ?05/09/2010 1.11 (L) 1.12 - 1.32 mmol/L Final  ?  ?  ?  ?  Failed - Na in normal range and within 180 days  ?  Sodium  ?Date Value Ref Range Status  ?09/11/2020 139 135 - 145 mmol/L Final  ?06/25/2017 139 134 - 144 mmol/L Final  ?12/07/2012 134 (L) 136 - 145 mmol/L Final  ?  ?  ?  ?  Failed - Cr in normal range and within 180 days  ?  Creat  ?Date Value Ref Range Status  ?09/07/2019 0.81 0.50 - 1.05 mg/dL Final  ?  Comment:  ?  For patients >35 years of age, the reference limit ?for Creatinine is approximately 13% higher for people ?identified as African-American. ?. ?  ? ?Creatinine, Ser  ?Date Value Ref Range Status  ?09/11/2020 0.83 0.44 - 1.00 mg/dL Final  ?  ?  ?  ?  Failed - Cl in normal range and within 180 days  ?  Chloride  ?Date Value Ref Range Status  ?09/11/2020 96 (L) 98 - 111 mmol/L Final  ?12/07/2012 103 98 - 107 mmol/L Final  ?  ?  ?  ?  Failed - Mg Level in normal range and within 180 days  ?  Magnesium  ?Date Value Ref  Range Status  ?09/05/2016 1.4 (L) 1.7 - 2.4 mg/dL Final  ?  ?  ?  ?  Passed - Last BP in normal range  ?  BP Readings from Last 1 Encounters:  ?07/17/21 126/80  ?  ?  ?  ?  Passed - Valid encounter within last 6 months  ?  Recent Outpatient Visits   ? ?      ? 3 days ago Mixed simple and mucopurulent chronic bronchitis ([place])  ? [facility] [place], [place] W, NP  ? 3 months ago Need for immunization against influenza  ? [facility] [place], [name], NP  ? 6 months ago Medicare annual wellness visit, subsequent  ? [facility] [place], [place] W, NP  ? 11 months ago Type 2 diabetes mellitus with complication, with long-term current use of insulin ([place])  ? [facility] [place], [place] W, NP  ? 1 year ago Fatigue, unspecified type  ?  [facility] Kathrine Haddock, NP  ? ?  ?  ?Future Appointments   ? ?        ? In 3 days [name], [name], NP [facility], [place]  ? In 6 months [name], [name], NP [facility], [place]  ? ?  ? ? ?  ?  ?  ? ?

## 2021-07-23 ENCOUNTER — Ambulatory Visit: Payer: Medicare HMO | Admitting: Internal Medicine

## 2021-07-29 DIAGNOSIS — F32A Depression, unspecified: Secondary | ICD-10-CM

## 2021-07-30 ENCOUNTER — Telehealth: Payer: Self-pay

## 2021-07-30 ENCOUNTER — Ambulatory Visit (INDEPENDENT_AMBULATORY_CARE_PROVIDER_SITE_OTHER): Payer: Medicaid Other

## 2021-07-30 ENCOUNTER — Telehealth: Payer: Medicare HMO

## 2021-07-30 DIAGNOSIS — F419 Anxiety disorder, unspecified: Secondary | ICD-10-CM

## 2021-07-30 DIAGNOSIS — Z794 Long term (current) use of insulin: Secondary | ICD-10-CM

## 2021-07-30 DIAGNOSIS — G894 Chronic pain syndrome: Secondary | ICD-10-CM

## 2021-07-30 DIAGNOSIS — J449 Chronic obstructive pulmonary disease, unspecified: Secondary | ICD-10-CM

## 2021-07-30 DIAGNOSIS — E1169 Type 2 diabetes mellitus with other specified complication: Secondary | ICD-10-CM

## 2021-07-30 NOTE — Telephone Encounter (Signed)
?  Care Management  ? ?Follow Up Note ? ? ?07/30/2021 ?Name: Preslie Depasquale MRN: [id number] DOB: [birth year] ? ? ?Referred by: [name], NP ?Reason for referral : Chronic Care Management (RNCM: Follow up for Chronic Disease Management and Care Coordination Needs) ? ? ?Call made back to the patient and the call was completed. See new encounter ? ?Follow Up Plan: Telephone follow up appointment with care management team member scheduled for: 10-08-2021 at 230 pm ? ?Noreene Larsson RN, MSN, CCM ?Community Care Coordinator ?[facility] ?[facility] ?Mobile: [phone]  ?

## 2021-07-30 NOTE — Patient Instructions (Signed)
Visit Information ? ?Thank you for taking time to visit with me today. Please don't hesitate to contact me if I can be of assistance to you before our next scheduled telephone appointment. ? ?Following are the goals we discussed today:  ?RNCM Clinical Goal(s):  ?Patient will verbalize understanding of plan for management of HTN, HLD, COPD, DMII, Anxiety, Depression, and Tobacco Use  ?verbalize basic understanding of HTN, HLD, COPD, DMII, Anxiety, Depression, and Tobacco Use disease process and self health management plan  ?take all medications exactly as prescribed and will call provider for medication related questions ?demonstrate understanding of rationale for each prescribed medication and take as prescribed  ?attend all scheduled medical appointments:  01-16-2022  with the pcp, upcoming colonoscopy on 08-16-2021 ?demonstrate improved and ongoing adherence to prescribed treatment plan for HTN, HLD, COPD, DMII, Anxiety, Depression, and Tobacco Use as evidenced by daily monitoring and recording of CBG  adherence to ADA/ carb modified diet adherence to prescribed medication regimen contacting provider for new or worsened symptoms or questions and working with the CCM team to optimize health and well being ?demonstrate improved and ongoing health management independence  ?continue to work with Consulting civil engineer and/or Social Worker to address care management and care coordination needs related to HTN, HLD, COPD, DMII, Anxiety, Depression, and Tobacco Use  ?demonstrate a decrease in HTN, HLD, COPD, DMII, Anxiety, Depression, and Tobacco Use exacerbations  ?demonstrate ongoing self health care management ability and effective management of chronic diseases through collaboration with RN Care manager, provider, and care team.  ?  ?Interventions: ?1:1 collaboration with primary care provider regarding development and update of comprehensive plan of care as evidenced by provider attestation and  co-signature ?Inter-disciplinary care team collaboration (see longitudinal plan of care) ?Evaluation of current treatment plan related to  self management and patient's adherence to plan as established by provider ?  ?  ?SDOH Barriers (Status: Goal on track: YES.)  ?Patient interviewed and SDOH assessment performed ?       ?SDOH Interventions   ?  ?Flowsheet Row Most Recent Value  ?SDOH Interventions    ?Food Insecurity Interventions Intervention Not Indicated  ?Financial Strain Interventions Intervention Not Indicated  ?Housing Interventions Intervention Not Indicated  ?Intimate Partner Violence Interventions Intervention Not Indicated  ?Physical Activity Interventions Other (Comments)  [no structured activity]  ?Stress Interventions Other (Comment)  [wants to work on smoking cessation]  ?Social Connections Interventions Other (Comment)  [good family support]  ?Transportation Interventions Intervention Not Indicated  ?Depression Interventions/Treatment  --  [the patient lost her son in 2016. The patient smokes 2 ppd, working with LCSW, trying to take ownership of her health and well being]  ?  ?   ?Patient interviewed and appropriate assessments performed ?Discussed plans with patient for ongoing care management follow up and provided patient with direct contact information for care management team ?Advised patient to call the office for changed in [place] or needs that the patient may need assistance with  ?Assisted patient/caregiver with obtaining information about health plan benefits- 01-15-2021: Review of OTC benefits and if the patient has an OTC benefit plan ?  ?  ?  ?COPD: (Status: Goal on track: YES.) ?Reviewed medications with patient, including use of prescribed maintenance and rescue inhalers, and provided instruction on medication management and the importance of adherence. 07-30-2021: States compliance with medications. Has her medications.  ?Provided patient with basic written and verbal COPD education on  self care/management/and exacerbation prevention. 01-15-2021: The patient had a CT screening  today to evaluate for lung cancer or other issues; ?Advised patient to track and manage COPD triggers. 01-15-2021: Knows that smoking cessation will help her with her COPD exacerbation. Has Chantix and wants to set a start date for smoking cessation. 05-21-2021: The patient has cut back on her smoking to 1 ppd. She states that she had a time last week with her allergies but she is better today. She states that she does have changes in her breathing sometimes when the weather changes. Review of possible triggers she could be experiencing or could have. 07-30-2021: The patient states that she is doing well with management of her COPD at this time. Denies any acute distress related to COPD.  ?Provided written and verbal instructions on pursed lip breathing and utilized returned demonstration as teach back; ?Provided instruction about proper use of medications used for management of COPD including inhalers; ?Advised patient to self assesses COPD action plan zone and make appointment with provider if in the yellow zone for 48 hours without improvement. 07-30-2021: The patient is using supplemental oxygen at night and states this is very helpful for her and she feels more rested when she gets up each morning.  ?Advised patient to engage in light exercise as tolerated 3-5 days a week to aid in the the management of COPD; ?Provided education about and advised patient to utilize infection prevention strategies to reduce risk of respiratory infection; ?Discussed the importance of adequate rest and management of fatigue with COPD; ?  ?Diabetes:  (Status: Goal on Track (progressing): YES.) ?     ?Lab Results  ?Component Value Date  ?  HGBA1C 7.7 (A) 09/07/2019  ?  HGBA1C 7.7 09/07/2019  ?  HGBA1C 7.7 (A) 09/07/2019  ?At endocrinologist office on 01-12-2021: A1C was 7.8 ?At endocrinologist office on 04-27-2021: A1C was 7.6 ?Assessed patient's  understanding of A1c goal: <7% ?Provided education to patient about basic DM disease process; ?Reviewed medications with patient and discussed importance of medication adherence. 07-30-2021: The patient states compliance with her medications;        ?Reviewed prescribed diet with patient heart healthy/ADA diet. 07-30-2021: The patient is compliant with heart healthy diet, review of healthy eating habits; ?Counseled on importance of regular laboratory monitoring as prescribed. 01-15-2021: Recent lab work with endocrinologist on 01-12-2021 with A1C of 7.8. 05-21-2021: Lab work on 04-27-2021 at the endocrinology office A1C was 7.6. 07-30-2021: The patient has lab work done on a regular basis. States she will call and see when she needs to follow up with endocrinologist again. Last visit was in January.        ?Discussed plans with patient for ongoing care management follow up and provided patient with direct contact information for care management team;      ?Provided patient with written educational materials related to hypo and hyperglycemia and importance of correct treatment. 05-21-2021: States the lowest she has seen is 90 and the highest she has seen is 160. She is trying to check it more frequently.        ?Reviewed scheduled/upcoming provider appointments including: 01-16-2022 with pcp;        ?Advised patient, providing education and rationale, to check cbg as directed  and record. 07-30-2021: Review of checking glucose on a regular basis. Review of fasting goal of <130 and post prandial of <180. Patient states she has upcoming appointments to see the endocrinologist.       ?call provider for findings outside established parameters;       ?Review  of patient status, including review of consultants reports, relevant laboratory and other test results, and medications completed;       ?  ?Depression and Anxiety  (Status: Goal on track: YES.) ?Evaluation of current treatment plan related to Anxiety and Depression, Mental  Health Concerns  self-management and patient's adherence to plan as established by provider. 05-21-2021: The patient feels she is doing good. She is excited about a trip she and her boyfriend are going to in March to the beach.  She i

## 2021-07-30 NOTE — Chronic Care Management (AMB) (Signed)
?Chronic Care Management  ? ?CCM RN Visit Note ? ?07/30/2021 ?Name: Brandi Calderon MRN: [id number] DOB: July 26, 1965 ? ?Subjective: ?Brandi Calderon is a 56 y.o. year old female who is a primary care patient of [name], NP. The care management team was consulted for assistance with disease management and care coordination needs.   ? ?Engaged with patient by telephone for follow up visit in response to provider referral for case management and/or care coordination services.  ? ?Consent to Services:  ?The patient was given information about Chronic Care Management services, agreed to services, and gave verbal consent prior to initiation of services.  Please see initial visit note for detailed documentation.  ? ?Patient agreed to services and verbal consent obtained.  ? ?Assessment: Review of patient past medical history, allergies, medications, health status, including review of consultants reports, laboratory and other test data, was performed as part of comprehensive evaluation and provision of chronic care management services.  ? ?SDOH (Social Determinants of Health) assessments and interventions performed:   ? ?CCM Care Plan ? ?Allergies  ?Allergen Reactions  ? Penicillins Anaphylaxis  ?  Has patient had a PCN reaction causing immediate rash, facial/tongue/throat swelling, SOB or lightheadedness with hypotension: Yes ?Has patient had a PCN reaction causing severe rash involving mucus membranes or skin necrosis: No ?Has patient had a PCN reaction that required hospitalization: No ?Has patient had a PCN reaction occurring within the last 10 years: No ?If all of the above answers are "NO", then may proceed with Cephalosporin use. ? ? ?Throat swells  ? Victoza [Liraglutide]   ?  Severe nausea  ? Vicodin [Hydrocodone-Acetaminophen] Rash  ?  And hives  ? ? ?Outpatient Encounter Medications as of 07/30/2021  ?Medication Sig  ? albuterol (VENTOLIN HFA) 108 (90 Base) MCG/ACT inhaler INHALE 1-2 PUFFS BY MOUTH EVERY 6  HOURS AS NEEDED FOR WHEEZE OR SHORTNESS OF BREATH  ? Aspirin-Salicylamide-Caffeine (BC HEADACHE POWDER PO) Take 4 packets by mouth daily as needed (pain).  ? atorvastatin (LIPITOR) 20 MG tablet Take 1 tablet (20 mg total) by mouth daily at 6 PM.  ? Blood Glucose Monitoring Suppl (ONETOUCH ULTRALINK) w/Device KIT 1 Device by Does not apply route daily.  ? clonazePAM (KLONOPIN) 0.5 MG tablet Take 1 tablet (0.5 mg total) by mouth daily as needed for anxiety.  ? fluticasone (FLONASE) 50 MCG/ACT nasal spray SPRAY 2 SPRAYS INTO EACH NOSTRIL EVERY DAY  ? furosemide (LASIX) 40 MG tablet TAKE 1 TABLET BY MOUTH EVERY DAY  ? gabapentin (NEURONTIN) 300 MG capsule Take by mouth 3 (three) times daily as needed.  ? glipiZIDE (GLUCOTROL XL) 5 MG 24 hr tablet Take 5 mg by mouth daily.  ? glucose blood test strip Use as instructed  ? JARDIANCE 25 MG TABS tablet Take 25 mg by mouth daily.  ? Lancets (ONETOUCH ULTRASOFT) lancets Use as instructed  ? metFORMIN (GLUCOPHAGE) 1000 MG tablet TAKE 1 TABLET (1,000 MG TOTAL) BY MOUTH 2 (TWO) TIMES DAILY WITH A MEAL.  ? OXYGEN Inhale 1 L into the lungs at bedtime.  ? pantoprazole (PROTONIX) 40 MG tablet TAKE 1 TABLET BY MOUTH EVERY DAY  ? TRELEGY ELLIPTA 100-62.5-25 MCG/ACT AEPB Inhale 1 puff into the lungs daily.  ? TRULICITY 4.5 NF/6.2ZH SOPN Inject 0.5 mLs (4.5 mg total) subcutaneously every 7 (seven) daysInject 0.5 mLs (4.5 mg total) subcutaneously every 7 (seven) days  ? ?No facility-administered encounter medications on file as of 07/30/2021.  ? ? ?Patient Active Problem List  ?  Diagnosis Date Noted  ? Neutrophilia 08/30/2020  ? Mixed simple and mucopurulent chronic bronchitis ([place]) 07/26/2020  ? Gastroesophageal reflux disease without esophagitis 07/26/2020  ? Hyperlipidemia associated with type 2 diabetes mellitus ([place]) 05/24/2020  ? Anxiety and depression 07/23/2018  ? Chronic pain syndrome 07/23/2018  ? Neuropathy due to type 2 diabetes mellitus ([place]) - severe 12/19/2015  ? Type 2  diabetes mellitus with other specified complication ([place]) 57/26/2035  ? ? ?Conditions to be addressed/monitored:HTN, HLD, COPD, DMII, Anxiety, Depression, and Smoker, chronic pain ? ?Care Plan : RNCM: General Plan of Care (Adult) for Chronic Disease Managment and Care Coordination needs  ?Updates made by Vanita Ingles, RN since 07/30/2021 12:00 AM  ?  ? ?Problem: RNCM: Development of Care Plan for Chronic Disease Management for Care Coordination Needs (COPD/HTN/HLD/DM/ Anxiety/Depression/Smoker)   ?Priority: High  ?  ? ?Long-Range Goal: RNCM: Development of Care Plan for Chronic Disease Management for Care Coordination Needs (COPD/HTN/HLD/DM/ Anxiety/Depression/Smoker)   ?Start Date: 01/15/2021  ?Expected End Date: 01/15/2022  ?Priority: High  ?Note:   ?Current Barriers:  ?Knowledge Deficits related to plan of care for management of HTN, HLD, COPD, DMII, and Anxiety with Excessive Worry, ?Panic Symptoms, ?Social Anxiety,, Depression: depressed mood ?anxiety ?disturbed sleep, and Grief  ?Care Coordination needs related to Substance abuse issues -  2 ppd smoker wanting to quit. 05-21-2021: States that she is now a 1 ppd smoker so she has cut back.  ?Chronic Disease Management support and education needs related to HTN, HLD, COPD, DMII, and Anxiety with Excessive Worry, ?Social Anxiety,, Depression: depressed mood ?anxiety ?disturbed sleep, and Grief ?Lacks caregiver support.  ?Non-adherence to scheduled provider appointments ?Non-adherence to prescribed medication regimen ? ?RNCM Clinical Goal(s):  ?Patient will verbalize understanding of plan for management of HTN, HLD, COPD, DMII, Anxiety, Depression, and Tobacco Use  ?verbalize basic understanding of HTN, HLD, COPD, DMII, Anxiety, Depression, and Tobacco Use disease process and self health management plan  ?take all medications exactly as prescribed and will call provider for medication related questions ?demonstrate understanding of rationale for each prescribed  medication and take as prescribed  ?attend all scheduled medical appointments:  01-16-2022  with the pcp, upcoming colonoscopy on 08-16-2021 ?demonstrate improved and ongoing adherence to prescribed treatment plan for HTN, HLD, COPD, DMII, Anxiety, Depression, and Tobacco Use as evidenced by daily monitoring and recording of CBG  adherence to ADA/ carb modified diet adherence to prescribed medication regimen contacting provider for new or worsened symptoms or questions and working with the CCM team to optimize health and well being ?demonstrate improved and ongoing health management independence  ?continue to work with Consulting civil engineer and/or Social Worker to address care management and care coordination needs related to HTN, HLD, COPD, DMII, Anxiety, Depression, and Tobacco Use  ?demonstrate a decrease in HTN, HLD, COPD, DMII, Anxiety, Depression, and Tobacco Use exacerbations  ?demonstrate ongoing self health care management ability and effective management of chronic diseases through collaboration with RN Care manager, provider, and care team.  ? ?Interventions: ?1:1 collaboration with primary care provider regarding development and update of comprehensive plan of care as evidenced by provider attestation and co-signature ?Inter-disciplinary care team collaboration (see longitudinal plan of care) ?Evaluation of current treatment plan related to  self management and patient's adherence to plan as established by provider ? ? ?SDOH Barriers (Status: Goal on track: YES.)  ?Patient interviewed and SDOH assessment performed ?       ?SDOH Interventions   ? ?Flowsheet Row Most  Recent Value  ?SDOH Interventions   ?Food Insecurity Interventions Intervention Not Indicated  ?Financial Strain Interventions Intervention Not Indicated  ?Housing Interventions Intervention Not Indicated  ?Intimate Partner Violence Interventions Intervention Not Indicated  ?Physical Activity Interventions Other (Comments)  [no structured activity]   ?Stress Interventions Other (Comment)  [wants to work on smoking cessation]  ?Social Connections Interventions Other (Comment)  [good family support]  ?Transportation Interventions Intervention Not Indicated

## 2021-07-31 ENCOUNTER — Other Ambulatory Visit: Payer: Self-pay | Admitting: Internal Medicine

## 2021-07-31 DIAGNOSIS — Z794 Long term (current) use of insulin: Secondary | ICD-10-CM

## 2021-07-31 DIAGNOSIS — E1169 Type 2 diabetes mellitus with other specified complication: Secondary | ICD-10-CM

## 2021-07-31 NOTE — Telephone Encounter (Signed)
Requested medication (s) are due for refill today - yes ? ?Requested medication (s) are on the active medication list -yes ? ?Future visit scheduled -yes ? ?Last refill: 11/10/20 #90 ? ?Notes to clinic: Request RF: fails lab protocol- over 1 year-2018 ? ?Requested Prescriptions  ?Pending Prescriptions Disp Refills  ? atorvastatin (LIPITOR) 20 MG tablet [Pharmacy Med Name: ATORVASTATIN 20 MG TABLET] 90 tablet 0  ?  Sig: TAKE 1 TABLET BY MOUTH DAILY AT 6 PM.  ?  ? Cardiovascular:  Antilipid - Statins Failed - 07/31/2021  2:23 AM  ?  ?  Failed - Lipid Panel in normal range within the last 12 months  ?  Cholesterol, Total  ?Date Value Ref Range Status  ?12/24/2016 120 100 - 199 mg/dL Final  ? ?Cholesterol  ?Date Value Ref Range Status  ?01/04/2019 205 (H) <200 mg/dL Final  ? ?LDL Cholesterol (Calc)  ?Date Value Ref Range Status  ?01/04/2019 146 (H) mg/dL (calc) Final  ?  Comment:  ?  Reference range: <100 ?[name] Kitchen ?Desirable range <100 mg/dL for primary prevention;   ?<70 mg/dL for patients with CHD or diabetic patients  ?with > or = 2 CHD risk factors. ?. ?LDL-C is now calculated using the Martin-Hopkins  ?calculation, which is a validated novel method providing  ?better accuracy than the Friedewald equation in the  ?estimation of LDL-C.  ?Cresenciano Genre et al. Annamaria Helling. 8338;250(53): 2061-2068  ?(http://education.[facility].com/faq/FAQ164) ?  ? ?HDL  ?Date Value Ref Range Status  ?01/04/2019 40 (L) > OR = 50 mg/dL Final  ?12/24/2016 29 (L) >39 mg/dL Final  ? ?Triglycerides  ?Date Value Ref Range Status  ?01/04/2019 87 <150 mg/dL Final  ? ?  ?  ?  Passed - Patient is not pregnant  ?  ?  Passed - Valid encounter within last 12 months  ?  Recent Outpatient Visits   ? ?      ? 2 weeks ago Mixed simple and mucopurulent chronic bronchitis ([place])  ? [facility] [place], [place] W, NP  ? 3 months ago Need for immunization against influenza  ? [facility] [place], [name], NP  ? 6 months ago Medicare annual  wellness visit, subsequent  ? [facility] [place], [place] W, NP  ? 1 year ago Type 2 diabetes mellitus with complication, with long-term current use of insulin ([place])  ? [facility] [place], [place] W, NP  ? 1 year ago Fatigue, unspecified type  ? [facility] Kathrine Haddock, NP  ? ?  ?  ?Future Appointments   ? ?        ? In 5 months [name], [name], NP [facility], [place]  ? ?  ? ? ?  ?  ?  ? ? ? ?Requested Prescriptions  ?Pending Prescriptions Disp Refills  ? atorvastatin (LIPITOR) 20 MG tablet [Pharmacy Med Name: ATORVASTATIN 20 MG TABLET] 90 tablet 0  ?  Sig: TAKE 1 TABLET BY MOUTH DAILY AT 6 PM.  ?  ? Cardiovascular:  Antilipid - Statins Failed - 07/31/2021  2:23 AM  ?  ?  Failed - Lipid Panel in normal range within the last 12 months  ?  Cholesterol, Total  ?Date Value Ref Range Status  ?12/24/2016 120 100 - 199 mg/dL Final  ? ?Cholesterol  ?Date Value Ref Range Status  ?01/04/2019 205 (H) <200 mg/dL Final  ? ?LDL Cholesterol (Calc)  ?Date Value Ref Range Status  ?01/04/2019 146 (  H) mg/dL (calc) Final  ?  Comment:  ?  Reference range: <100 ?[name] Kitchen ?Desirable range <100 mg/dL for primary prevention;   ?<70 mg/dL for patients with CHD or diabetic patients  ?with > or = 2 CHD risk factors. ?. ?LDL-C is now calculated using the Martin-Hopkins  ?calculation, which is a validated novel method providing  ?better accuracy than the Friedewald equation in the  ?estimation of LDL-C.  ?Cresenciano Genre et al. Annamaria Helling. 8101;751(02): 2061-2068  ?(http://education.[facility].com/faq/FAQ164) ?  ? ?HDL  ?Date Value Ref Range Status  ?01/04/2019 40 (L) > OR = 50 mg/dL Final  ?12/24/2016 29 (L) >39 mg/dL Final  ? ?Triglycerides  ?Date Value Ref Range Status  ?01/04/2019 87 <150 mg/dL Final  ? ?  ?  ?  Passed - Patient is not pregnant  ?  ?  Passed - Valid encounter within last 12 months  ?  Recent Outpatient Visits   ? ?      ? 2 weeks ago Mixed simple and mucopurulent chronic  bronchitis ([place])  ? [facility] [place], [place] W, NP  ? 3 months ago Need for immunization against influenza  ? [facility] [place], [name], NP  ? 6 months ago Medicare annual wellness visit, subsequent  ? [facility] [place], [place] W, NP  ? 1 year ago Type 2 diabetes mellitus with complication, with long-term current use of insulin ([place])  ? [facility] [place], [place] W, NP  ? 1 year ago Fatigue, unspecified type  ? [facility] Kathrine Haddock, NP  ? ?  ?  ?Future Appointments   ? ?        ? In 5 months [name], [name], NP [facility], [place]  ? ?  ? ? ?  ?  ?  ? ? ? ?

## 2021-08-01 ENCOUNTER — Other Ambulatory Visit: Payer: Self-pay | Admitting: Internal Medicine

## 2021-08-01 DIAGNOSIS — U071 COVID-19: Secondary | ICD-10-CM

## 2021-08-01 DIAGNOSIS — R0602 Shortness of breath: Secondary | ICD-10-CM

## 2021-08-01 NOTE — Telephone Encounter (Signed)
Requested Prescriptions  ?Pending Prescriptions Disp Refills  ?? albuterol (VENTOLIN HFA) 108 (90 Base) MCG/ACT inhaler [Pharmacy Med Name: ALBUTEROL HFA (PROVENTIL) INH] 6.7 each 1  ?  Sig: INHALE 1-2 PUFFS BY MOUTH EVERY 6 HOURS AS NEEDED FOR WHEEZE OR SHORTNESS OF BREATH  ?  ? Pulmonology:  Beta Agonists 2 Passed - 08/01/2021  2:24 AM  ?  ?  Passed - Last BP in normal range  ?  BP Readings from Last 1 Encounters:  ?07/17/21 126/80  ?   ?  ?  Passed - Last Heart Rate in normal range  ?  Pulse Readings from Last 1 Encounters:  ?07/17/21 79  ?   ?  ?  Passed - Valid encounter within last 12 months  ?  Recent Outpatient Visits   ?      ? 2 weeks ago Mixed simple and mucopurulent chronic bronchitis ([place])  ? [facility] [place], [place] W, NP  ? 3 months ago Need for immunization against influenza  ? [facility] [place], [name], NP  ? 6 months ago Medicare annual wellness visit, subsequent  ? [facility] [place], [place] W, NP  ? 1 year ago Type 2 diabetes mellitus with complication, with long-term current use of insulin ([place])  ? [facility], [place] W, NP  ? 1 year ago Fatigue, unspecified type  ? [facility] Kathrine Haddock, NP  ?  ?  ?Future Appointments   ?        ? In 5 months [name], [name], NP [facility], [place]  ?  ? ?  ?  ?  ? ?

## 2021-08-09 ENCOUNTER — Encounter (HOSPITAL_COMMUNITY): Payer: Self-pay | Admitting: Gastroenterology

## 2021-08-09 NOTE — Progress Notes (Signed)
Attempted to obtain medical history via telephone, unable to reach at this time. Unable to leave voicemail to return pre surgical testing department's phone call,due to mailbox full.  

## 2021-08-13 ENCOUNTER — Other Ambulatory Visit: Payer: Self-pay | Admitting: Internal Medicine

## 2021-08-14 NOTE — Telephone Encounter (Signed)
Requested medication (s) are due for refill today:   Provider to review ? ?Requested medication (s) are on the active medication list:   Yes ? ?Future visit scheduled:   Yes in 5 mo. ? ? ?Last ordered: 06/07/2021 #30, 0 refills ? ?Non delegated refill reason returned  ? ?Requested Prescriptions  ?Pending Prescriptions Disp Refills  ? clonazePAM (KLONOPIN) 0.5 MG tablet [Pharmacy Med Name: CLONAZEPAM 0.5 MG TABLET] 30 tablet 0  ?  Sig: TAKE 1 TABLET BY MOUTH EVERY DAY AS NEEDED FOR ANXIETY  ?  ? Not Delegated - Psychiatry: Anxiolytics/Hypnotics 2 Failed - 08/13/2021 12:55 PM  ?  ?  Failed - This refill cannot be delegated  ?  ?  Failed - Urine Drug Screen completed in last 360 days  ?  ?  Passed - Patient is not pregnant  ?  ?  Passed - Valid encounter within last 6 months  ?  Recent Outpatient Visits   ? ?      ? 4 weeks ago Mixed simple and mucopurulent chronic bronchitis ([place])  ? [facility] [place], [place] W, NP  ? 3 months ago Need for immunization against influenza  ? [facility] [place], [name], NP  ? 7 months ago Medicare annual wellness visit, subsequent  ? [facility] [place], [place] W, NP  ? 1 year ago Type 2 diabetes mellitus with complication, with long-term current use of insulin ([place])  ? [facility] [place], [place] W, NP  ? 1 year ago Fatigue, unspecified type  ? [facility] Kathrine Haddock, NP  ? ?  ?  ?Future Appointments   ? ?        ? In 5 months [name], [name], NP [facility] [place]  ? ?  ? ? ?  ?  ?  ? ?

## 2021-08-16 ENCOUNTER — Encounter (HOSPITAL_COMMUNITY): Payer: Self-pay | Admitting: Certified Registered Nurse Anesthetist

## 2021-08-16 ENCOUNTER — Telehealth: Payer: Self-pay | Admitting: Gastroenterology

## 2021-08-16 ENCOUNTER — Ambulatory Visit (HOSPITAL_COMMUNITY): Admission: RE | Admit: 2021-08-16 | Payer: Medicare HMO | Source: Home / Self Care | Admitting: Gastroenterology

## 2021-08-16 ENCOUNTER — Encounter (HOSPITAL_COMMUNITY): Payer: Self-pay | Admitting: Gastroenterology

## 2021-08-16 SURGERY — COLONOSCOPY WITH PROPOFOL
Anesthesia: Monitor Anesthesia Care

## 2021-08-16 NOTE — Anesthesia Preprocedure Evaluation (Deleted)
Anesthesia Evaluation    Reviewed: Allergy & Precautions, Patient's Chart, lab work & pertinent test results  Airway        Dental   Pulmonary asthma , COPD, Current Smoker,           Cardiovascular hypertension,      Neuro/Psych  Headaches, PSYCHIATRIC DISORDERS Anxiety Depression    GI/Hepatic Neg liver ROS, GERD  ,  Endo/Other  diabetes  Renal/GU negative Renal ROS     Musculoskeletal  (+) Arthritis ,   Abdominal   Peds  Hematology negative hematology ROS (+)   Anesthesia Other Findings   Reproductive/Obstetrics                             Anesthesia Physical Anesthesia Plan  ASA: 3  Anesthesia Plan: MAC   Post-op Pain Management:    Induction: Intravenous  PONV Risk Score and Plan: 0 and Propofol infusion  Airway Management Planned: Natural Airway and Simple Face Mask  Additional Equipment: None  Intra-op Plan:   Post-operative Plan:   Informed Consent:   Plan Discussed with: CRNA  Anesthesia Plan Comments:         Anesthesia Quick Evaluation

## 2021-08-16 NOTE — Telephone Encounter (Signed)
We received a call from patient wanting to reschedule admission at [facility] for 08/16/21. Per patient, took prep solution, never had a BM. Patient also states her care partner has pneumonia and cannot take her. Please advise.

## 2021-08-16 NOTE — Telephone Encounter (Signed)
Pt has been added to Dr. Tarri Glenn hospital wait list as follows:  [place] colon polyps; O2 dependent; claims she did not have a BM with Suprep. Will need 2 day bowel prep; DM; no BTs; will need to be scheduled for PV d/t last OV being longer than 90 days.  Pt will be notified once provider schedule is available for scheduling.

## 2021-08-29 DIAGNOSIS — E785 Hyperlipidemia, unspecified: Secondary | ICD-10-CM

## 2021-08-29 DIAGNOSIS — Z794 Long term (current) use of insulin: Secondary | ICD-10-CM

## 2021-08-29 DIAGNOSIS — J449 Chronic obstructive pulmonary disease, unspecified: Secondary | ICD-10-CM

## 2021-08-29 DIAGNOSIS — F32A Depression, unspecified: Secondary | ICD-10-CM

## 2021-08-29 DIAGNOSIS — E1169 Type 2 diabetes mellitus with other specified complication: Secondary | ICD-10-CM

## 2021-08-29 DIAGNOSIS — F419 Anxiety disorder, unspecified: Secondary | ICD-10-CM

## 2021-08-30 DIAGNOSIS — J449 Chronic obstructive pulmonary disease, unspecified: Secondary | ICD-10-CM | POA: Diagnosis not present

## 2021-09-03 ENCOUNTER — Telehealth: Payer: Medicare HMO

## 2021-09-03 DIAGNOSIS — E1142 Type 2 diabetes mellitus with diabetic polyneuropathy: Secondary | ICD-10-CM | POA: Diagnosis not present

## 2021-09-03 DIAGNOSIS — F17200 Nicotine dependence, unspecified, uncomplicated: Secondary | ICD-10-CM | POA: Diagnosis not present

## 2021-09-03 DIAGNOSIS — E119 Type 2 diabetes mellitus without complications: Secondary | ICD-10-CM | POA: Diagnosis not present

## 2021-09-03 DIAGNOSIS — R69 Illness, unspecified: Secondary | ICD-10-CM | POA: Diagnosis not present

## 2021-09-03 LAB — COMPREHENSIVE METABOLIC PANEL: eGFR: 65

## 2021-09-03 LAB — BASIC METABOLIC PANEL: Creatinine: 0.9 (ref <=1.1)

## 2021-09-05 ENCOUNTER — Ambulatory Visit (INDEPENDENT_AMBULATORY_CARE_PROVIDER_SITE_OTHER): Payer: Medicare HMO | Admitting: Pharmacist

## 2021-09-05 DIAGNOSIS — Z794 Long term (current) use of insulin: Secondary | ICD-10-CM

## 2021-09-05 NOTE — Patient Instructions (Signed)
Visit Information  Thank you for taking time to visit with me today. Please don't hesitate to contact me if I can be of assistance to you before our next scheduled telephone appointment.  Following are the goals we discussed today:   Goals Addressed             This Visit's Progress    Pharmacy Goals       Our goal A1c is less than 7%. This corresponds with fasting sugars less than 130 and 2 hour after meal sugars less than 180. Please check your blood sugar each morning  Our goal bad cholesterol, or LDL, is less than 70 . This is why it is important to continue taking your atorvastatin  Please follow up with the [place] [facility] for their help with quitting smoking.  The [place] [facility] phone number is: [phone]  Feel free to call me with any questions or concerns. I look forward to our next call!  Wallace Cullens, PharmD, [place] [phone]         Our next appointment is by telephone on 7/17 at 10 am  Please call the care guide team at [phone] if you need to cancel or reschedule your appointment.    Patient verbalizes understanding of instructions and care plan provided today and agrees to view in [place]. Active MyChart status and patient understanding of how to access instructions and care plan via MyChart confirmed with patient.

## 2021-09-05 NOTE — Chronic Care Management (AMB) (Signed)
Chronic Care Management CCM Pharmacy Note  09/05/2021 Name:  Brandi Calderon MRN:  [id number] DOB:  1965-12-04   Subjective: Brandi Calderon is an 56 y.o. year old female who is a primary patient of [name], NP.  The CCM team was consulted for assistance with disease management and care coordination needs.    Engaged with patient by telephone for follow up visit for pharmacy case management and/or care coordination services.   Objective:  Medications Reviewed Today     Reviewed by Effie Berkshire, MD (Physician) on 08/16/21 at [phone]  Med List Status: Complete   Medication Order Taking? Sig Documenting Provider Last Dose Status Informant  albuterol (VENTOLIN HFA) 108 (90 Base) MCG/ACT inhaler [id number] Yes INHALE 1-2 PUFFS BY MOUTH EVERY 6 HOURS AS NEEDED FOR WHEEZE OR SHORTNESS OF BREATH [name], [name], NP  Active Self  Aspirin-Salicylamide-Caffeine (BC HEADACHE POWDER PO) [id number] Yes Take 4 packets by mouth daily as needed (pain). [provider]  Active Self  atorvastatin (LIPITOR) 20 MG tablet [id number] Yes TAKE 1 TABLET BY MOUTH DAILY AT 6 PM. [name], NP  Active Self  Blood Glucose Monitoring Suppl [facility]) w/Device KIT [id number]  1 Device by Does not apply route daily. [name], NP  Active Self  clonazePAM (KLONOPIN) 0.5 MG tablet [id number]  TAKE 1 TABLET BY MOUTH EVERY DAY AS NEEDED FOR ANXIETY [name], NP  Active   fluticasone [facility]) 50 MCG/ACT nasal spray [id number] Yes SPRAY 2 SPRAYS INTO EACH NOSTRIL EVERY DAY [name], NP  Active Self  furosemide (LASIX) 40 MG tablet [id number] Yes TAKE 1 TABLET BY MOUTH EVERY DAY [name], [name], NP  Active Self  gabapentin (NEURONTIN) 300 MG capsule [id number] Yes Take 300 mg by mouth 3 (three) times daily as needed (pain). [provider]  Active Self  glipiZIDE (GLUCOTROL XL) 5 MG 24 hr tablet [id number] Yes Take 5 mg by mouth daily. [provider]  Active  Self  glucose blood test strip [id number]  Use as instructed [name], NP  Active Self  JARDIANCE 25 MG TABS tablet [id number] Yes Take 25 mg by mouth daily. [provider]  Active Self  Lancets Glory Rosebush ULTRASOFT) lancets [id number]  Use as instructed [name], NP  Active Self  metFORMIN (GLUCOPHAGE) 1000 MG tablet [id number] Yes TAKE 1 TABLET (1,000 MG TOTAL) BY MOUTH 2 (TWO) TIMES DAILY WITH A MEAL. [name], NP  Active Self  OXYGEN [id number] Yes Inhale 1 L into the lungs at bedtime. [provider]  Active Self  pantoprazole (PROTONIX) 40 MG tablet [id number] Yes TAKE 1 TABLET BY MOUTH EVERY DAY [name], NP  Active Self  TRELEGY ELLIPTA 100-62.5-25 MCG/ACT AEPB [id number] Yes Inhale 1 puff into the lungs daily. Olin Hauser, DO  Active Self  TRULICITY 4.5 FX/9.0WI Bonney Aid [id number] Yes Inject 0.5 mLs (4.5 mg total) subcutaneously every 7 (seven) daysInject 0.5 mLs (4.5 mg total) subcutaneously every 7 (seven) days  Patient taking differently: Inject 4.5 mg into the skin every Sunday. Inject 0.5 mLs (4.5 mg total) subcutaneously every 7 (seven) days   [name], [name], NP  Active Self  Med List Note [name], RN 06/25/18 2426): UDS 06-25-2018            Pertinent Labs:  Lab Results  Component Value Date   HGBA1C 7.7 (A) 09/07/2019   HGBA1C 7.7 09/07/2019   HGBA1C 7.7 (A)  09/07/2019  Per shared record from [place] A1C 8.1% on 09/03/2021   Lab Results  Component Value Date   CREATININE 0.83 09/11/2020   BUN 14 09/11/2020   NA 139 09/11/2020   K 3.9 09/11/2020   CL 96 (L) 09/11/2020   CO2 33 (H) 09/11/2020    SDOH:  (Social Determinants of Health) assessments and interventions performed:    [place]  Review of patient past medical history, allergies, medications, health status, including review of consultants reports, laboratory and other test data, was performed as part of comprehensive  evaluation and provision of chronic care management services.   Care Plan : General Pharmacy (Adult)  Updates made by [name], RPH-CPP since 09/05/2021 12:00 AM     Problem: Disease Progression      Long-Range Goal: Disease Progression Prevented or Minimized   Start Date: 03/06/2020  Expected End Date: 06/04/2020  Recent Progress: On track  Priority: High  Note:   Current Barriers:  Unable to self administer medications as prescribed Lack of blood sugar readings for clinical team  Pharmacist Clinical Goal(s):  Over the next 90 days, patient will achieve adherence to monitoring guidelines and medication adherence to achieve therapeutic efficacy. through collaboration with PharmD and provider.   Interventions: Inter-disciplinary care team collaboration (see longitudinal plan of care) 1:1 collaboration with [name], NP regarding development and update of comprehensive plan of care as evidenced by provider attestation and co-signature Perform chart review.  Patient seen for Office Visit with [facility] Endocrinology on 6/5. Per review of provider's note: Patient missed lab appointment prior to office appointment, so labs completed on 6/5 Patient advised office would contact her with results and any necessary changes to her regimen Patient advised to check blood sugar more frequently, at least once a day fasting Today patient reports she is awaiting a call back from Endocrinology office regarding results of lab work/any medication changes Our telephone appointment is cut short today as patient reports that she is heading into another appointment Encourage patient to contact provider office as needed to follow up regarding medical concerns  Type 2 Diabetes: Patient followed by [facility] Endocrinology for management of T2DM Uncontrolled based on latest A1C reading from 09/03/2021; current treatment:  Metformin 1000 mg twice daily Trulicity 4.5 mg weekly on  Mondays Jardiance 25 mg daily Glipizide ER 5 mg daily with breakfast Denies monitoring home blood sugar recently Have counseled patient on symptoms of hypoglycemia and management of lows Reported has glucose tablets. Have encouraged patient to carry these with her Encourage patient to start monitoring home blood sugar as directed, keep log of results and have this to review at medical appointments and to call Endocrinologist sooner for reading outside of established parameters, particularly for any episodes of hypoglycemia    Medication Adherence: Patient previously expressed interest in using pill packaging to manage medications, but since has reported doing well with using weekly pillbox Uses daily phone alarm as medication adherence tool     Patient Goals/Self-Care Activities Over the next 90 days, patient will:  Focus on medication adherence by using weekly pillbox as adherence tool Check blood glucose in morning and at bedtime and bring meter to future appointments with Endocrinologist Attend medical appointments as scheduled  Follow Up Plan: Telephone follow up appointment with care management team member scheduled for: 7/17 at 10 am      Wallace Cullens, PharmD, [place], [facility] [place] [phone]

## 2021-09-18 ENCOUNTER — Other Ambulatory Visit: Payer: Self-pay | Admitting: Internal Medicine

## 2021-09-18 DIAGNOSIS — K219 Gastro-esophageal reflux disease without esophagitis: Secondary | ICD-10-CM

## 2021-09-18 DIAGNOSIS — U071 COVID-19: Secondary | ICD-10-CM

## 2021-09-18 DIAGNOSIS — R0602 Shortness of breath: Secondary | ICD-10-CM

## 2021-09-19 ENCOUNTER — Other Ambulatory Visit: Payer: Self-pay | Admitting: Internal Medicine

## 2021-09-19 NOTE — Telephone Encounter (Signed)
Requested medication (s) are due for refill today: yes  Requested medication (s) are on the active medication list: yes  Last refill:  06/27/21 #30 with 0 RF  Future visit scheduled: 01/16/22, seen 07/17/21  Notes to clinic:  Failed protocol of labs within 360 days, has upcoming appt, please assess.       Requested Prescriptions  Pending Prescriptions Disp Refills   furosemide (LASIX) 40 MG tablet [Pharmacy Med Name: FUROSEMIDE 40 MG TABLET] 30 tablet 0    Sig: TAKE 1 TABLET BY MOUTH EVERY DAY     Cardiovascular:  Diuretics - Loop Failed - 09/18/2021  6:31 PM      Failed - K in normal range and within 180 days    Potassium  Date Value Ref Range Status  09/11/2020 3.9 3.5 - 5.1 mmol/L Final  12/07/2012 3.4 (L) 3.5 - 5.1 mmol/L Final         Failed - Ca in normal range and within 180 days    Calcium  Date Value Ref Range Status  09/11/2020 9.5 8.9 - 10.3 mg/dL Final   Calcium, Total  Date Value Ref Range Status  12/07/2012 8.6 8.5 - 10.1 mg/dL Final   Calcium, Ion  Date Value Ref Range Status  05/09/2010 1.11 (L) 1.12 - 1.32 mmol/L Final         Failed - Na in normal range and within 180 days    Sodium  Date Value Ref Range Status  09/11/2020 139 135 - 145 mmol/L Final  06/25/2017 139 134 - 144 mmol/L Final  12/07/2012 134 (L) 136 - 145 mmol/L Final         Failed - Cr in normal range and within 180 days    Creat  Date Value Ref Range Status  09/07/2019 0.81 0.50 - 1.05 mg/dL Final    Comment:    For patients >72 years of age, the reference limit for Creatinine is approximately 13% higher for people identified as African-American. .    Creatinine, Ser  Date Value Ref Range Status  09/11/2020 0.83 0.44 - 1.00 mg/dL Final         Failed - Cl in normal range and within 180 days    Chloride  Date Value Ref Range Status  09/11/2020 96 (L) 98 - 111 mmol/L Final  12/07/2012 103 98 - 107 mmol/L Final         Failed - Mg Level in normal range and within 180  days    Magnesium  Date Value Ref Range Status  09/05/2016 1.4 (L) 1.7 - 2.4 mg/dL Final         Passed - Last BP in normal range    BP Readings from Last 1 Encounters:  07/17/21 126/80         Passed - Valid encounter within last 6 months    Recent Outpatient Visits           2 months ago Mixed simple and mucopurulent chronic bronchitis ([place])   [facility], [name], NP   5 months ago Need for immunization against influenza   [place], [name], NP   8 months ago Medicare annual wellness visit, subsequent   [facility] [place], [place] W, NP   1 year ago Type 2 diabetes mellitus with complication, with long-term current use of insulin [facility])   [facility] [place], [name], NP   1 year ago Fatigue, unspecified type  [facility] Kathrine Haddock, NP       Future Appointments             In 3 months [name], [name], NP [facility]            Refused Prescriptions Disp Refills   fluticasone (FLONASE) 50 MCG/ACT nasal spray [Pharmacy Med Name: FLUTICASONE PROP 50 MCG SPRAY] 48 mL 1    Sig: SPRAY 2 SPRAYS INTO EACH NOSTRIL EVERY DAY     Ear, Nose, and Throat: Nasal Preparations - Corticosteroids Passed - 09/18/2021  6:31 PM      Passed - Valid encounter within last 12 months    Recent Outpatient Visits           2 months ago Mixed simple and mucopurulent chronic bronchitis ([place])   [facility], [name], NP   5 months ago Need for immunization against influenza   [facility] [place], [name], NP   8 months ago Medicare annual wellness visit, subsequent   [facility] [place], [place] W, NP   1 year ago Type 2 diabetes mellitus with complication, with long-term current use of insulin ([place])   [facility], [name], NP   1 year ago Fatigue, unspecified type   [facility]  Kathrine Haddock, NP       Future Appointments             In 3 months [name], [name], NP [facility]             pantoprazole ([place]) 40 MG tablet [Pharmacy Med Name: PANTOPRAZOLE SOD DR 40 MG TAB] 90 tablet 1    Sig: TAKE 1 [place]     Gastroenterology: Proton Pump Inhibitors Passed - 09/18/2021  6:31 PM      Passed - Valid encounter within last 12 months    Recent Outpatient Visits           2 months ago Mixed simple and mucopurulent chronic bronchitis ([place])   [facility], [name], NP   5 months ago Need for immunization against influenza   [facility] [place], [name], NP   8 months ago Medicare annual wellness visit, subsequent   [facility] [place], [place] W, NP   1 year ago Type 2 diabetes mellitus with complication, with long-term current use of insulin ([place])   [facility] [place], [name], NP   1 year ago Fatigue, unspecified type   [facility] Kathrine Haddock, NP       Future Appointments             In 3 months [place], [name], NP [facility]             albuterol (VENTOLIN HFA) 108 (90 Base) MCG/ACT inhaler [Pharmacy Med Name: ALBUTEROL HFA (PROVENTIL) INH] 6.7 each 1    Sig: INHALE 1-2 PUFFS BY MOUTH EVERY 6 HOURS AS NEEDED FOR WHEEZE OR SHORTNESS OF BREATH     Pulmonology:  Beta Agonists 2 Passed - 09/18/2021  6:31 PM      Passed - Last BP in normal range    BP Readings from Last 1 Encounters:  07/17/21 126/80         Passed - Last Heart Rate in normal range    Pulse Readings  from Last 1 Encounters:  07/17/21 79         Passed - Valid encounter within last 12 months    Recent Outpatient Visits           2 months ago Mixed simple and mucopurulent chronic bronchitis ([place])   [facility] [place], [name], NP   5 months ago Need for immunization against influenza   [place], [name], NP   8 months ago Medicare annual wellness visit, subsequent   [facility] [place], [place], NP   1 year ago Type 2 diabetes mellitus with complication, with long-term current use of insulin [facility])   [facility] [place], [name], NP   1 year ago Fatigue, unspecified type   [place], NP       Future Appointments             In 3 months [name], [name], NP [facility], [facility]

## 2021-09-19 NOTE — Telephone Encounter (Signed)
Lasix sent to provider. Requested Prescriptions  Pending Prescriptions Disp Refills  . furosemide (LASIX) 40 MG tablet [Pharmacy Med Name: FUROSEMIDE 40 MG TABLET] 30 tablet 0    Sig: TAKE 1 TABLET BY MOUTH EVERY DAY     Cardiovascular:  Diuretics - Loop Failed - 09/18/2021  6:31 PM      Failed - K in normal range and within 180 days    Potassium  Date Value Ref Range Status  09/11/2020 3.9 3.5 - 5.1 mmol/L Final  12/07/2012 3.4 (L) 3.5 - 5.1 mmol/L Final         Failed - Ca in normal range and within 180 days    Calcium  Date Value Ref Range Status  09/11/2020 9.5 8.9 - 10.3 mg/dL Final   Calcium, Total  Date Value Ref Range Status  12/07/2012 8.6 8.5 - 10.1 mg/dL Final   Calcium, Ion  Date Value Ref Range Status  05/09/2010 1.11 (L) 1.12 - 1.32 mmol/L Final         Failed - Na in normal range and within 180 days    Sodium  Date Value Ref Range Status  09/11/2020 139 135 - 145 mmol/L Final  06/25/2017 139 134 - 144 mmol/L Final  12/07/2012 134 (L) 136 - 145 mmol/L Final         Failed - Cr in normal range and within 180 days    Creat  Date Value Ref Range Status  09/07/2019 0.81 0.50 - 1.05 mg/dL Final    Comment:    For patients >4 years of age, the reference limit for Creatinine is approximately 13% higher for people identified as African-American. .    Creatinine, Ser  Date Value Ref Range Status  09/11/2020 0.83 0.44 - 1.00 mg/dL Final         Failed - Cl in normal range and within 180 days    Chloride  Date Value Ref Range Status  09/11/2020 96 (L) 98 - 111 mmol/L Final  12/07/2012 103 98 - 107 mmol/L Final         Failed - Mg Level in normal range and within 180 days    Magnesium  Date Value Ref Range Status  09/05/2016 1.4 (L) 1.7 - 2.4 mg/dL Final         Passed - Last BP in normal range    BP Readings from Last 1 Encounters:  07/17/21 126/80         Passed - Valid encounter within last 6 months    Recent Outpatient Visits          2  months ago Mixed simple and mucopurulent chronic bronchitis ([place])   [facility], [name], NP   5 months ago Need for immunization against influenza   [place], [name], NP   8 months ago Medicare annual wellness visit, subsequent   [facility] [place], [name], NP   1 year ago Type 2 diabetes mellitus with complication, with long-term current use of insulin [facility])   [facility], [name], NP   1 year ago Fatigue, unspecified type   [place], NP      Future Appointments            In 3 months [name], [name], NP [facility], [place]  . fluticasone (FLONASE)  50 MCG/ACT nasal spray [Pharmacy Med Name: FLUTICASONE PROP 50 MCG SPRAY] 48 mL 1    Sig: SPRAY 2 SPRAYS INTO EACH NOSTRIL EVERY DAY     Ear, Nose, and Throat: Nasal Preparations - Corticosteroids Passed - 09/18/2021  6:31 PM      Passed - Valid encounter within last 12 months    Recent Outpatient Visits          2 months ago Mixed simple and mucopurulent chronic bronchitis ([place])   [facility], [name], NP   5 months ago Need for immunization against influenza   [place], [name], NP   8 months ago Medicare annual wellness visit, subsequent   [facility] [place], [name], NP   1 year ago Type 2 diabetes mellitus with complication, with long-term current use of insulin [facility])   [facility], [name], NP   1 year ago Fatigue, unspecified type   [place], NP      Future Appointments            In 3 months [name], [name], NP [facility], [place]           . pantoprazole (PROTONIX) 40 MG tablet [Pharmacy Med Name: PANTOPRAZOLE SOD DR 40 MG TAB] 90 tablet 1    Sig: TAKE 1 TABLET BY MOUTH EVERY DAY      Gastroenterology: Proton Pump Inhibitors Passed - 09/18/2021  6:31 PM      Passed - Valid encounter within last 12 months    Recent Outpatient Visits          2 months ago Mixed simple and mucopurulent chronic bronchitis ([place])   [facility], [name], NP   5 months ago Need for immunization against influenza   [place], [name], NP   8 months ago Medicare annual wellness visit, subsequent   [facility] [place], [name], NP   1 year ago Type 2 diabetes mellitus with complication, with long-term current use of insulin [facility])   [facility], [name], NP   1 year ago Fatigue, unspecified type   [place], NP      Future Appointments            In 3 months [name], [name], NP [facility], [place]           . albuterol (VENTOLIN HFA) 108 (90 Base) MCG/ACT inhaler [Pharmacy Med Name: ALBUTEROL HFA (PROVENTIL) INH] 6.7 each 1    Sig: INHALE 1-2 PUFFS BY MOUTH EVERY 6 HOURS AS NEEDED FOR WHEEZE OR SHORTNESS OF BREATH     Pulmonology:  Beta Agonists 2 Passed - 09/18/2021  6:31 PM      Passed - Last BP in normal range    BP Readings from Last 1 Encounters:  07/17/21 126/80         Passed - Last Heart Rate in normal range    Pulse Readings from Last 1 Encounters:  07/17/21 79         Passed - Valid encounter within last 12 months    Recent Outpatient Visits          2 months ago Mixed simple and mucopurulent chronic bronchitis ([place])   [facility] [place], [name], NP   5 months ago Need for immunization  against influenza   [place], NP   8 months ago Medicare annual wellness visit, subsequent   [facility] [place], [place], NP   1 year ago Type 2 diabetes mellitus with complication, with long-term current use of insulin [facility])   [facility] [place], [name], NP   1 year ago  Fatigue, unspecified type   [place], NP      Future Appointments            In 3 months [name], [name], NP [facility], [facility]

## 2021-09-19 NOTE — Telephone Encounter (Signed)
Requested medication (s) are due for refill today: yes  Requested medication (s) are on the active medication list: yes  Last refill:  08/14/21  Future visit scheduled: yes  Notes to clinic:  Unable to refill per protocol, cannot delegate.      Requested Prescriptions  Pending Prescriptions Disp Refills   clonazePAM (KLONOPIN) 0.5 MG tablet [Pharmacy Med Name: CLONAZEPAM 0.5 MG TABLET] 30 tablet 0    Sig: TAKE 1 TABLET BY MOUTH EVERY DAY AS NEEDED FOR ANXIETY     Not Delegated - Psychiatry: Anxiolytics/Hypnotics 2 Failed - 09/19/2021  9:52 AM      Failed - This refill cannot be delegated      Failed - Urine Drug Screen completed in last 360 days      Passed - Patient is not pregnant      Passed - Valid encounter within last 6 months    Recent Outpatient Visits           2 months ago Mixed simple and mucopurulent chronic bronchitis ([place])   [facility], [name], NP   5 months ago Need for immunization against influenza   [place], [name], NP   8 months ago Medicare annual wellness visit, subsequent   [facility] [place], [place], NP   1 year ago Type 2 diabetes mellitus with complication, with long-term current use of insulin [facility])   [facility], [name], NP   1 year ago Fatigue, unspecified type   [place], NP       Future Appointments             In 3 months [name], [name], NP [facility], [facility]

## 2021-09-22 ENCOUNTER — Other Ambulatory Visit: Payer: Self-pay | Admitting: Internal Medicine

## 2021-09-22 DIAGNOSIS — J449 Chronic obstructive pulmonary disease, unspecified: Secondary | ICD-10-CM | POA: Diagnosis not present

## 2021-09-22 DIAGNOSIS — R0602 Shortness of breath: Secondary | ICD-10-CM

## 2021-09-22 DIAGNOSIS — U071 COVID-19: Secondary | ICD-10-CM

## 2021-09-28 DIAGNOSIS — E1169 Type 2 diabetes mellitus with other specified complication: Secondary | ICD-10-CM

## 2021-09-28 DIAGNOSIS — Z794 Long term (current) use of insulin: Secondary | ICD-10-CM

## 2021-10-08 ENCOUNTER — Telehealth: Payer: Medicaid Other

## 2021-10-08 ENCOUNTER — Telehealth: Payer: Self-pay

## 2021-10-08 NOTE — Telephone Encounter (Signed)
  Care Management   Follow Up Note   10/08/2021 Name: Brandi Calderon MRN: [id number] DOB: [birth year]   Referred by: [name], NP Reason for referral : Chronic Care Management (RNCM; Follow up/Case Closure for Chronic Disease Management and Care Coordination Needs)   An unsuccessful telephone outreach was attempted today. The patient was referred to the case management team for assistance with care management and care coordination. The patient did not have voice mail available to leave message.   Follow Up Plan: No further follow up required: the patient has met the goals of care. Will follow if pcp as for new referral.  Noreene Larsson RN, MSN, [place] Mobile: [phone]

## 2021-10-15 ENCOUNTER — Ambulatory Visit (INDEPENDENT_AMBULATORY_CARE_PROVIDER_SITE_OTHER): Payer: Medicare HMO | Admitting: Pharmacist

## 2021-10-15 DIAGNOSIS — E1169 Type 2 diabetes mellitus with other specified complication: Secondary | ICD-10-CM

## 2021-10-15 DIAGNOSIS — J418 Mixed simple and mucopurulent chronic bronchitis: Secondary | ICD-10-CM

## 2021-10-15 NOTE — Chronic Care Management (AMB) (Unsigned)
Chronic Care Management CCM Pharmacy Note  10/15/2021 Name:  Brandi Calderon MRN:  [id number] DOB:  1965/10/24   Subjective: Brandi Calderon is an 56 y.o. year old female who is a primary patient of [name], NP.  The CCM team was consulted for assistance with disease management and care coordination needs.    Engaged with patient by telephone for follow up visit for pharmacy case management and/or care coordination services.   Objective:  Medications Reviewed Today     Reviewed by [name], RPH-CPP (Pharmacist) on 10/15/21 at 2203  Med List Status: <None>   Medication Order Taking? Sig Documenting Provider Last Dose Status Informant  albuterol (VENTOLIN HFA) 108 (90 Base) MCG/ACT inhaler [id number] Yes INHALE 1-2 PUFFS BY MOUTH EVERY 6 HOURS AS NEEDED FOR WHEEZE OR SHORTNESS OF BREATH [name], [name], NP Taking Active   Aspirin-Salicylamide-Caffeine (BC HEADACHE POWDER PO) [id number]  Take 4 packets by mouth daily as needed (pain). [provider]  Active Self  atorvastatin (LIPITOR) 20 MG tablet [id number]  TAKE 1 TABLET BY MOUTH DAILY AT 6 PM. [name], NP  Active Self  Blood Glucose Monitoring Suppl [facility]) w/Device KIT [id number]  1 Device by Does not apply route daily. [name], NP  Active Self  clonazePAM (KLONOPIN) 0.5 MG tablet [id number]  TAKE 1 TABLET BY MOUTH EVERY DAY AS NEEDED FOR ANXIETY [name], NP  Active   fluticasone [facility]) 50 MCG/ACT nasal spray [id number]  SPRAY 2 SPRAYS INTO EACH NOSTRIL EVERY DAY [name], NP  Active Self  furosemide (LASIX) 40 MG tablet [id number]  TAKE 1 TABLET BY MOUTH EVERY DAY [name], [name], NP  Active   gabapentin (NEURONTIN) 300 MG capsule [id number]  Take 300 mg by mouth 3 (three) times daily as needed (pain). [provider]  Active Self  glipiZIDE (GLUCOTROL XL) 5 MG 24 hr tablet [id number] No Take 5 mg by mouth daily.  Patient not taking: Reported on 10/15/2021    [provider] Not Taking Active Self  glucose blood test strip [id number]  Use as instructed [name], NP  Active Self  JARDIANCE 25 MG TABS tablet [id number] Yes Take 25 mg by mouth daily. [provider] Taking Active Self  Lancets Glory Rosebush ULTRASOFT) lancets [id number]  Use as instructed [name], NP  Active Self  metFORMIN (GLUCOPHAGE) 1000 MG tablet [id number] Yes TAKE 1 TABLET (1,000 MG TOTAL) BY MOUTH 2 (TWO) TIMES DAILY WITH A MEAL. [name], NP Taking Active Self  OXYGEN [id number]  Inhale 1 L into the lungs at bedtime. [provider]  Active Self  pantoprazole (PROTONIX) 40 MG tablet [id number]  TAKE 1 TABLET BY MOUTH EVERY DAY [name], [name], NP  Active Self  Semaglutide, 2 MG/DOSE, 8 MG/3ML SOPN [id number] Yes Inject 1 mg into the skin once a week. [provider] Taking Active   TRELEGY ELLIPTA 100-62.5-25 MCG/ACT AEPB [id number] Yes Inhale 1 puff into the lungs daily. Olin Hauser, DO Taking Active Self  Med List Note [name], RN 06/25/18 8016): UDS 06-25-2018            Pertinent Labs:  Lab Results  Component Value Date   HGBA1C 7.7 (A) 09/07/2019   HGBA1C 7.7 09/07/2019   HGBA1C 7.7 (A) 09/07/2019  Per shared record from [place] A1C 8.1% on 09/03/2021  Lab Results  Component Value Date   CREATININE 0.83 09/11/2020  BUN 14 09/11/2020   NA 139 09/11/2020   K 3.9 09/11/2020   CL 96 (L) 09/11/2020   CO2 33 (H) 09/11/2020    SDOH:  (Social Determinants of Health) assessments and interventions performed:    [place]  Review of patient past medical history, allergies, medications, health status, including review of consultants reports, laboratory and other test data, was performed as part of comprehensive evaluation and provision of chronic care management services.   Care Plan : General Pharmacy (Adult)  Updates made by [name], RPH-CPP since 10/15/2021  12:00 AM     Problem: Disease Progression      Long-Range Goal: Disease Progression Prevented or Minimized   Start Date: 03/06/2020  Expected End Date: 06/04/2020  Recent Progress: On track  Priority: High  Note:   Current Barriers:  Unable to self administer medications as prescribed Lack of blood sugar readings for clinical team  Pharmacist Clinical Goal(s):  Over the next 90 days, patient will achieve adherence to monitoring guidelines and medication adherence to achieve therapeutic efficacy. through collaboration with PharmD and provider.   Interventions: Inter-disciplinary care team collaboration (see longitudinal plan of care) 1:1 collaboration with [name], NP regarding development and update of comprehensive plan of care as evidenced by provider attestation and co-signature Perform chart review.  Per Encounter from [facility] Endocrinology on 6/7, patient's Rx for Trulicity was discontinued and patient prescribed Ozempic 2 mg weekly Per Endocrinology telephone encounter from 6/14, patient contacted the clinic regarding side effects (vomiting, diarrhea, etc) with first dose of Ozempic 2 mg. Provider advised patient to "decrease her Ozempic to 1 mg (36 clicks of the 2 mg pens)" Today patient reports that she has an appointment to establish care with a counselor and psychiatrist, but unable to recall name of practice/providers today  Type 2 Diabetes: Patient followed by [facility] Endocrinology for management of T2DM Uncontrolled based on latest A1C reading from 09/03/2021; current treatment:  Metformin 1000 mg twice daily Ozempic 1 mg weekly on Mondays (reports started ~1 month ago) Jardiance 25 mg daily Reports stopped both Trulicity and glipizide ER when started on Ozempic Previous therapies tried: Trulicity (stopped when changed to Ozempic), Ozempic 2 mg (side effects) Denies monitoring home blood sugar recently. Reports last checked ~2-3 weeks ago Counsel  patient on the rational for/importance of blood sugar monitoring Advise patient to RESTART monitoring home blood sugar as directed, keep log of results and have this to review at medical appointments and to call Endocrinologist sooner for reading outside of established parameters or symptoms Counsel patient on symptoms of hypoglycemia and management of lows Reported has glucose tablets. Have encouraged patient to carry these with her Counsel patient on importance of having regular well-balanced meals, while controlling carbohydrate portion sizes Reports drinking water, but also ~20 oz of coca cola/day Encourage patient to work on reducing soda consumption Place coordination of care call to [facility] Endocrinology for clarification regarding patient's glipizide Rx. Leave message with RN Kieth Brightly Per Encounter from [facility] Endocrinology on 6/7, patient's Rx for Trulicity was discontinued and patient prescribed Ozempic 2 mg weekly. Today patient reports that when she started taking Ozempic, she stopped both Trulicity and glipizide ER, as she believes that she was instructed to stop both by Endocrinology Per shared record, glipizide ER 5 mg daily Rx remains active on patient's medication list from [facility] Endocrinology Request phone call back to patient  Tobacco use: Reports currently smoking ~2 packs/day Interested in quitting smoking,  but not ready to set quit date today Reports family is her motivation for quitting. States her daughters have told her that they would quit smoking with her Triggers: boredom, others smoking around her Strategy: playing games online to help with boredom Denies having started nicotine patches, but has Step 1 (21 mg/day) patches Have counseled on use of patches Encourage patient to reach out to [facility] Quitline for support    COPD: Current treatment: Trelegy - 1 puff daily Albuterol - 1-2 puffs every 6 hours as needed Overnight oxygen Reports using  Trelegy inhaler daily as directed, rinsing mouth after each use and using rescue (albuterol) inhaler as needed as directed   Medication Adherence: Patient previously expressed interest in using pill packaging to manage medications, but since has reported doing well with using weekly pillbox Uses daily phone alarm as medication adherence tool     Patient Goals/Self-Care Activities Over the next 90 days, patient will:  Focus on medication adherence by using weekly pillbox as adherence tool Check blood glucose in morning and at bedtime and bring meter to future appointments with Endocrinologist Attend medical appointments as scheduled Next appointment with Endocrinology on 9/12 (lab work on 9/7)  Follow Up Plan: Telephone follow up appointment with care management team member scheduled for: 11/14/2021 at 10:45 am      Wallace Cullens, PharmD, [place] [phone]

## 2021-10-16 NOTE — Patient Instructions (Signed)
Visit Information  Thank you for taking time to visit with me today. Please don't hesitate to contact me if I can be of assistance to you before our next scheduled telephone appointment.  Following are the goals we discussed today:   Goals Addressed             This Visit's Progress    Pharmacy Goals       Our goal A1c is less than 7%. This corresponds with fasting sugars less than 130 and 2 hour after meal sugars less than 180. Please check your blood sugar each morning  Our goal bad cholesterol, or LDL, is less than 70 . This is why it is important to continue taking your atorvastatin  Please follow up with the [place] Quitline for their help with quitting smoking.  The [place] Quitline phone number is: [phone]  Feel free to call me with any questions or concerns. I look forward to our next call!  Wallace Cullens, PharmD, [place] [phone]         Our next appointment is by telephone on 11/14/2021 at 10:45 am  Please call the care guide team at [phone] if you need to cancel or reschedule your appointment.    Patient verbalizes understanding of instructions and care plan provided today and agrees to view in [place]. Active MyChart status and patient understanding of how to access instructions and care plan via MyChart confirmed with patient.

## 2021-10-17 ENCOUNTER — Other Ambulatory Visit: Payer: Self-pay | Admitting: Internal Medicine

## 2021-10-18 NOTE — Telephone Encounter (Signed)
Requested medication (s) are due for refill today:   Provider to review  Requested medication (s) are on the active medication list:   Yes  Future visit scheduled:   Yes   Last ordered: 09/20/2021 #30, 0 refills  Non delegated refill    Requested Prescriptions  Pending Prescriptions Disp Refills   clonazePAM (KLONOPIN) 0.5 MG tablet [Pharmacy Med Name: CLONAZEPAM 0.5 MG TABLET] 30 tablet 0    Sig: TAKE 1 TABLET BY MOUTH EVERY DAY AS NEEDED FOR ANXIETY     Not Delegated - Psychiatry: Anxiolytics/Hypnotics 2 Failed - 10/17/2021  1:09 PM      Failed - This refill cannot be delegated      Failed - Urine Drug Screen completed in last 360 days      Passed - Patient is not pregnant      Passed - Valid encounter within last 6 months    Recent Outpatient Visits           3 months ago Mixed simple and mucopurulent chronic bronchitis ([place])   [facility] [place], [name], NP   6 months ago Need for immunization against influenza   [facility] [place], [name], NP   9 months ago Medicare annual wellness visit, subsequent   [facility] [place], [place] W, NP   1 year ago Type 2 diabetes mellitus with complication, with long-term current use of insulin [facility])   [facility] [place], [name], NP   1 year ago Fatigue, unspecified type   [place], NP       Future Appointments             In 3 months [name], [name], NP [facility]

## 2021-10-22 DIAGNOSIS — J449 Chronic obstructive pulmonary disease, unspecified: Secondary | ICD-10-CM | POA: Diagnosis not present

## 2021-10-29 DIAGNOSIS — J418 Mixed simple and mucopurulent chronic bronchitis: Secondary | ICD-10-CM

## 2021-10-29 DIAGNOSIS — E1169 Type 2 diabetes mellitus with other specified complication: Secondary | ICD-10-CM

## 2021-10-30 ENCOUNTER — Telehealth: Payer: Self-pay

## 2021-10-30 ENCOUNTER — Other Ambulatory Visit: Payer: Self-pay

## 2021-10-30 DIAGNOSIS — Z9981 Dependence on supplemental oxygen: Secondary | ICD-10-CM

## 2021-10-30 DIAGNOSIS — Z86010 Personal history of colonic polyps: Secondary | ICD-10-CM

## 2021-10-30 NOTE — Telephone Encounter (Signed)
Patient has been rescheduled for colon at [facility] on 12/24/21 at 10:15 am with Dr. Tarri Glenn. Amb ref & hospital orders placed. PV scheduled for 11/28/21 at 10:30 am, pt will require 2 day bowel prep. She is not on any blood thinners, however she is diabetic. Pt is aware & advised to call back with any questions.

## 2021-11-14 ENCOUNTER — Ambulatory Visit: Payer: Medicaid Other | Admitting: Pharmacist

## 2021-11-14 ENCOUNTER — Other Ambulatory Visit: Payer: Self-pay | Admitting: Internal Medicine

## 2021-11-14 DIAGNOSIS — R0602 Shortness of breath: Secondary | ICD-10-CM

## 2021-11-14 DIAGNOSIS — F17200 Nicotine dependence, unspecified, uncomplicated: Secondary | ICD-10-CM

## 2021-11-14 DIAGNOSIS — U071 COVID-19: Secondary | ICD-10-CM

## 2021-11-14 DIAGNOSIS — E1169 Type 2 diabetes mellitus with other specified complication: Secondary | ICD-10-CM

## 2021-11-14 DIAGNOSIS — J418 Mixed simple and mucopurulent chronic bronchitis: Secondary | ICD-10-CM

## 2021-11-14 NOTE — Patient Instructions (Signed)
Visit Information  Thank you for taking time to visit with me today. Please don't hesitate to contact me if I can be of assistance to you before our next scheduled telephone appointment.  Following are the goals we discussed today:   Goals Addressed             This Visit's Progress    Pharmacy Goals       Our goal A1c is less than 7%. This corresponds with fasting sugars less than 130 and 2 hour after meal sugars less than 180. Please check your blood sugar each morning  Our goal bad cholesterol, or LDL, is less than 70 . This is why it is important to continue taking your atorvastatin  Please follow up with the [place] Quitline for their help with quitting smoking.  The [place] Quitline phone number is: [phone]  Feel free to call me with any questions or concerns. I look forward to our next call!   Wallace Cullens, PharmD, [place] [phone]         Our next appointment is by telephone on 12/31/2021 at 10:00 AM  Please call the care guide team at [phone] if you need to cancel or reschedule your appointment.    Patient verbalizes understanding of instructions and care plan provided today and agrees to view in [place]. Active MyChart status and patient understanding of how to access instructions and care plan via MyChart confirmed with patient.

## 2021-11-14 NOTE — Telephone Encounter (Signed)
Requested Prescriptions  Pending Prescriptions Disp Refills  . albuterol (VENTOLIN HFA) 108 (90 Base) MCG/ACT inhaler [Pharmacy Med Name: ALBUTEROL HFA (PROVENTIL) INH] 6.7 each 1    Sig: INHALE 1-2 PUFFS BY MOUTH EVERY 6 HOURS AS NEEDED FOR WHEEZE OR SHORTNESS OF BREATH     Pulmonology:  Beta Agonists 2 Passed - 11/14/2021  1:52 AM      Passed - Last BP in normal range    BP Readings from Last 1 Encounters:  07/17/21 126/80         Passed - Last Heart Rate in normal range    Pulse Readings from Last 1 Encounters:  07/17/21 79         Passed - Valid encounter within last 12 months    Recent Outpatient Visits          4 months ago Mixed simple and mucopurulent chronic bronchitis ([place])   [facility] [place], [name], NP   6 months ago Need for immunization against influenza   [place], [name], NP   10 months ago Medicare annual wellness visit, subsequent   [facility] [place], [name], NP   1 year ago Type 2 diabetes mellitus with complication, with long-term current use of insulin [facility])   [facility] [place], [name], NP   1 year ago Fatigue, unspecified type   [place], NP      Future Appointments            In 2 months [name], [name], NP [facility], [facility]

## 2021-11-14 NOTE — Chronic Care Management (AMB) (Signed)
Chronic Care Management CCM Pharmacy Note  11/14/2021 Name:  Brandi Calderon MRN:  [id number] DOB:  May 10, 1965   Subjective: Brandi Calderon is an 56 y.o. year old female who is a primary patient of Brandi Fenton, NP.  The CCM team was consulted for assistance with disease management and care coordination needs.    Engaged with patient by telephone for follow up visit for pharmacy case management and/or care coordination services.   Objective:  Medications Reviewed Today     Reviewed by [name], RPH-CPP (Pharmacist) on 10/15/21 at 2203  Med List Status: <None>   Medication Order Taking? Sig Documenting Provider Last Dose Status Informant  albuterol (VENTOLIN HFA) 108 (90 Base) MCG/ACT inhaler [id number] Yes INHALE 1-2 PUFFS BY MOUTH EVERY 6 HOURS AS NEEDED FOR WHEEZE OR SHORTNESS OF BREATH [name], [name], NP Taking Active   Aspirin-Salicylamide-Caffeine (BC HEADACHE POWDER PO) [id number]  Take 4 packets by mouth daily as needed (pain). [provider]  Active Self  atorvastatin (LIPITOR) 20 MG tablet [id number]  TAKE 1 TABLET BY MOUTH DAILY AT 6 PM. Brandi Fenton, NP  Active Self  Blood Glucose Monitoring Suppl [facility]) w/Device KIT [id number]  1 Device by Does not apply route daily. Brandi Fenton, NP  Active Self  clonazePAM (KLONOPIN) 0.5 MG tablet [id number]  TAKE 1 TABLET BY MOUTH EVERY DAY AS NEEDED FOR ANXIETY Brandi Fenton, NP  Active   fluticasone [facility]) 50 MCG/ACT nasal spray [id number]  SPRAY 2 SPRAYS INTO EACH NOSTRIL EVERY DAY Brandi Fenton, NP  Active Self  furosemide (LASIX) 40 MG tablet [id number]  TAKE 1 TABLET BY MOUTH EVERY DAY [name], [name], NP  Active   gabapentin (NEURONTIN) 300 MG capsule [id number]  Take 300 mg by mouth 3 (three) times daily as needed (pain). [provider]  Active Self  glipiZIDE (GLUCOTROL XL) 5 MG 24 hr tablet [id number] No Take 5 mg by mouth daily.  Patient not taking: Reported on 10/15/2021    [provider] Not Taking Active Self  glucose blood test strip [id number]  Use as instructed Brandi Fenton, NP  Active Self  JARDIANCE 25 MG TABS tablet [id number] Yes Take 25 mg by mouth daily. [provider] Taking Active Self  Lancets Glory Rosebush ULTRASOFT) lancets [id number]  Use as instructed Brandi Fenton, NP  Active Self  metFORMIN (GLUCOPHAGE) 1000 MG tablet [id number] Yes TAKE 1 TABLET (1,000 MG TOTAL) BY MOUTH 2 (TWO) TIMES DAILY WITH A MEAL. Brandi Fenton, NP Taking Active Self  OXYGEN [id number]  Inhale 1 L into the lungs at bedtime. [provider]  Active Self  pantoprazole (PROTONIX) 40 MG tablet [id number]  TAKE 1 TABLET BY MOUTH EVERY DAY [name], [name], NP  Active Self  Semaglutide, 2 MG/DOSE, 8 MG/3ML SOPN [id number] Yes Inject 1 mg into the skin once a week. [provider] Taking Active   TRELEGY ELLIPTA 100-62.5-25 MCG/ACT AEPB [id number] Yes Inhale 1 puff into the lungs daily. Olin Hauser, DO Taking Active Self  Med List Note Brandi Shorter, RN 06/25/18 1583): UDS 06-25-2018            Pertinent Labs:  Lab Results  Component Value Date   HGBA1C 7.7 (A) 09/07/2019   HGBA1C 7.7 09/07/2019   HGBA1C 7.7 (A) 09/07/2019  Per shared record from [place] A1C 8.1% on 09/03/2021   Lab Results  Component Value Date   CREATININE 0.83  09/11/2020   BUN 14 09/11/2020   NA 139 09/11/2020   K 3.9 09/11/2020   CL 96 (L) 09/11/2020   CO2 33 (H) 09/11/2020    SDOH:  (Social Determinants of Health) assessments and interventions performed:    [place]  Review of patient past medical history, allergies, medications, health status, including review of consultants reports, laboratory and other test data, was performed as part of comprehensive evaluation and provision of chronic care management services.   Care Plan : General Pharmacy (Adult)  Updates made by [name], RPH-CPP since 11/14/2021  12:00 AM     Problem: Disease Progression      Long-Range Goal: Disease Progression Prevented or Minimized   Start Date: 03/06/2020  Expected End Date: 06/04/2020  Recent Progress: On track  Priority: High  Note:   Current Barriers:  Unable to self administer medications as prescribed Lack of blood sugar readings for clinical team  Pharmacist Clinical Goal(s):  Over the next 90 days, patient will achieve adherence to monitoring guidelines and medication adherence to achieve therapeutic efficacy. through collaboration with PharmD and provider.   Interventions: Inter-disciplinary care team collaboration (see longitudinal plan of care) 1:1 collaboration with [name], NP regarding development and update of comprehensive plan of care as evidenced by provider attestation and co-signature Today patient reports that she would like to talk to her PCP about her hormones and possible menopausal symptoms.  Advise patient to contact office today to schedule appointment with PCP  Type 2 Diabetes: Patient followed by [facility] for management of T2DM Uncontrolled based on latest A1C reading from 09/03/2021; current treatment:  Metformin 1000 mg twice daily Ozempic 1 mg weekly on Mondays Jardiance 25 mg daily glipizide ER 5 mg daily (reports restarted after received a call back from Endocrinology ~7/17) Previous therapies tried: Trulicity (stopped when changed to [employer]) Again denies monitoring home blood sugar recently Again counsel patient on the rational for/importance of blood sugar monitoring Advise patient to RESTART monitoring home blood sugar as directed, keep log of results and have this to review at medical appointments and to call Endocrinologist sooner for reading outside of established parameters or symptoms Denies recent symptoms of hypoglycemia Have counseled on symptoms of hypoglycemia and how to manage lows Statin: atorvastatin 20 mg daily Counsel patient  on importance of having regular well-balanced meals, while controlling carbohydrate portion sizes Reports drinking water, but still drinking ~20 oz of coca cola/day Encourage patient to work on reducing soda consumption  Tobacco use: Reports has reduced smoking to ~1.5 packs/day Interested in quitting smoking, but not ready to set quit date today Reports her goal now is to cut back to 1 pack/day Reports family is her motivation for quitting. States her daughters have told her that they would quit smoking with her Triggers: boredom, others smoking around her, stress Denies having started nicotine patches, but has Step 1 (21 mg/day) patches Have counseled on use of patches Encourage patient to reach out to [facility] Quitline for support    COPD: Current treatment: Trelegy - 1 puff daily Albuterol - 1-2 puffs every 6 hours as needed Overnight oxygen Reports using Trelegy inhaler daily as directed, rinsing mouth after each use and using rescue (albuterol) inhaler as needed as directed  Medication Adherence: Today patient reports she is again interested in using pill packaging to manage medications States will contact [place] to discuss having all of her prescriptions transferred from [place] and start pill packaging Uses daily  phone alarm as medication adherence tool     Patient Goals/Self-Care Activities Over the next 90 days, patient will:  Focus on medication adherence by using weekly pillbox as adherence tool Check blood glucose in morning and at bedtime and bring meter to future appointments with Endocrinologist Attend medical appointments as scheduled Next appointment with Endocrinology on 9/12 (lab work on 9/7)  Follow Up Plan: Telephone follow up appointment with care management team member scheduled for: 12/31/2021 at 10:00 AM      Wallace Cullens, PharmD, [place] [phone]

## 2021-11-16 ENCOUNTER — Other Ambulatory Visit: Payer: Self-pay | Admitting: Internal Medicine

## 2021-11-16 NOTE — Telephone Encounter (Signed)
Requested medication (s) are due for refill today: yes  Requested medication (s) are on the active medication list: yes  Last refill:  10/18/21 #30/0  Future visit scheduled: yes  Notes to clinic:  Unable to refill per protocol, cannot delegate.    Requested Prescriptions  Pending Prescriptions Disp Refills   clonazePAM (KLONOPIN) 0.5 MG tablet [Pharmacy Med Name: CLONAZEPAM 0.5 MG TABLET] 30 tablet 0    Sig: TAKE 1 TABLET BY MOUTH EVERY DAY AS NEEDED FOR ANXIETY     Not Delegated - Psychiatry: Anxiolytics/Hypnotics 2 Failed - 11/16/2021  9:02 AM      Failed - This refill cannot be delegated      Failed - Urine Drug Screen completed in last 360 days      Passed - Patient is not pregnant      Passed - Valid encounter within last 6 months    Recent Outpatient Visits           4 months ago Mixed simple and mucopurulent chronic bronchitis ([place])   [facility], [name], NP   7 months ago Need for immunization against influenza   [facility], [name], NP   10 months ago Medicare annual wellness visit, subsequent   [facility] [place], [place], NP   1 year ago Type 2 diabetes mellitus with complication, with long-term current use of insulin [facility])   [facility], [name], NP   1 year ago Fatigue, unspecified type   [place], NP       Future Appointments             In 1 week [facility] Gunner, [name], NP [facility], [place]   In 2 months [place], [name], NP [facility], [facility]

## 2021-11-22 DIAGNOSIS — J449 Chronic obstructive pulmonary disease, unspecified: Secondary | ICD-10-CM | POA: Diagnosis not present

## 2021-11-27 ENCOUNTER — Ambulatory Visit (INDEPENDENT_AMBULATORY_CARE_PROVIDER_SITE_OTHER): Payer: Medicare HMO | Admitting: Internal Medicine

## 2021-11-27 ENCOUNTER — Encounter: Payer: Self-pay | Admitting: Internal Medicine

## 2021-11-27 VITALS — BP 106/72 | HR 77 | Temp 96.8°F | Wt 132.0 lb

## 2021-11-27 DIAGNOSIS — Z23 Encounter for immunization: Secondary | ICD-10-CM

## 2021-11-27 DIAGNOSIS — R69 Illness, unspecified: Secondary | ICD-10-CM | POA: Diagnosis not present

## 2021-11-27 DIAGNOSIS — N951 Menopausal and female climacteric states: Secondary | ICD-10-CM | POA: Diagnosis not present

## 2021-11-27 DIAGNOSIS — F32A Depression, unspecified: Secondary | ICD-10-CM | POA: Diagnosis not present

## 2021-11-27 DIAGNOSIS — R6882 Decreased libido: Secondary | ICD-10-CM

## 2021-11-27 DIAGNOSIS — F419 Anxiety disorder, unspecified: Secondary | ICD-10-CM | POA: Diagnosis not present

## 2021-11-27 MED ORDER — PAROXETINE HCL 10 MG PO TABS: 10.0000 mg | ORAL_TABLET | Freq: Every day | ORAL | 1 refills | Status: DC

## 2021-11-27 NOTE — Patient Instructions (Signed)
Menopause Menopause is the normal time of a woman's life when menstrual periods stop completely. It marks the natural end to a woman's ability to become pregnant. It can be defined as the absence of a menstrual period for 12 months without another medical cause. The transition to menopause (perimenopause) most often happens between the ages of 45 and 55, and can last for many years. During perimenopause, hormone levels change in your body, which can cause symptoms and affect your health. Menopause may increase your risk for: Weakened bones (osteoporosis), which causes fractures. Depression. Hardening and narrowing of the arteries (atherosclerosis), which can cause heart attacks and strokes. What are the causes? This condition is usually caused by a natural change in hormone levels that happens as you get older. The condition may also be caused by changes that are not natural, including: Surgery to remove both ovaries (surgical menopause). Side effects from some medicines, such as chemotherapy used to treat cancer (chemical menopause). What increases the risk? This condition is more likely to start at an earlier age if you have certain medical conditions or have undergone treatments, including: A tumor of the pituitary gland in the brain. A disease that affects the ovaries and hormones. Certain cancer treatments, such as chemotherapy or hormone therapy, or radiation therapy on the pelvis. Heavy smoking and excessive alcohol use. Family history of early menopause. This condition is also more likely to develop earlier in women who are very thin. What are the signs or symptoms? Symptoms of this condition include: Hot flashes. Irregular menstrual periods. Night sweats. Changes in feelings about sex. This could be a decrease in sex drive or an increased discomfort around your sexuality. Vaginal dryness and thinning of the vaginal walls. This may cause painful sex. Dryness of the skin and  development of wrinkles. Headaches. Problems sleeping (insomnia). Mood swings or irritability. Memory problems. Weight gain. Hair growth on the face and chest. Bladder infections or problems with urinating. How is this diagnosed? This condition is diagnosed based on your medical history, a physical exam, your age, your menstrual history, and your symptoms. Hormone tests may also be done. How is this treated? In some cases, no treatment is needed. You and your health care provider should make a decision together about whether treatment is necessary. Treatment will be based on your individual condition and preferences. Treatment for this condition focuses on managing symptoms. Treatment may include: Menopausal hormone therapy (MHT). Medicines to treat specific symptoms or complications. Acupuncture. Vitamin or herbal supplements. Before starting treatment, make sure to let your health care provider know if you have a personal or family history of these conditions: Heart disease. Breast cancer. Blood clots. Diabetes. Osteoporosis. Follow these instructions at home: Lifestyle Do not use any products that contain nicotine or tobacco, such as cigarettes, e-cigarettes, and chewing tobacco. If you need help quitting, ask your health care provider. Get at least 30 minutes of physical activity on 5 or more days each week. Avoid alcoholic and caffeinated beverages, as well as spicy foods. This may help prevent hot flashes. Get 7-8 hours of sleep each night. If you have hot flashes, try: Dressing in layers. Avoiding things that may trigger hot flashes, such as spicy food, warm places, or stress. Taking slow, deep breaths when a hot flash starts. Keeping a fan in your home and office. Find ways to manage stress, such as deep breathing, meditation, or journaling. Consider going to group therapy with other women who are having menopause symptoms. Ask your health care   provider about recommended  group therapy meetings. Eating and drinking  Eat a healthy, balanced diet that contains whole grains, lean protein, low-fat dairy, and plenty of fruits and vegetables. Your health care provider may recommend adding more soy to your diet. Foods that contain soy include tofu, tempeh, and soy milk. Eat plenty of foods that contain calcium and vitamin D for bone health. Items that are rich in calcium include low-fat milk, yogurt, beans, almonds, sardines, broccoli, and kale. Medicines Take over-the-counter and prescription medicines only as told by your health care provider. Talk with your health care provider before starting any herbal supplements. If prescribed, take vitamins and supplements as told by your health care provider. General instructions  Keep track of your menstrual periods, including: When they occur. How heavy they are and how long they last. How much time passes between periods. Keep track of your symptoms, noting when they start, how often you have them, and how long they last. Use vaginal lubricants or moisturizers to help with vaginal dryness and improve comfort during sex. Keep all follow-up visits. This is important. This includes any group therapy or counseling. Contact a health care provider if: You are still having menstrual periods after age 55. You have pain during sex. You have not had a period for 12 months and you develop vaginal bleeding. Get help right away if you have: Severe depression. Excessive vaginal bleeding. Pain when you urinate. A fast or irregular heartbeat (palpitations). Severe headaches. Abdominal pain or severe indigestion. Summary Menopause is a normal time of life when menstrual periods stop completely. It is usually defined as the absence of a menstrual period for 12 months without another medical cause. The transition to menopause (perimenopause) most often happens between the ages of 45 and 55 and can last for several years. Symptoms  can be managed through medicines, lifestyle changes, and complementary therapies such as acupuncture. Eat a balanced diet that is rich in nutrients to promote bone health and heart health and to manage symptoms during menopause. This information is not intended to replace advice given to you by your health care provider. Make sure you discuss any questions you have with your health care provider. Document Revised: 12/17/2019 Document Reviewed: 09/02/2019 Elsevier Patient Education  2023 [employer]  

## 2021-11-27 NOTE — Assessment & Plan Note (Signed)
We will trial paroxetine 10 mg daily Continue clonazepam as needed Support offered

## 2021-11-27 NOTE — Assessment & Plan Note (Signed)
She is a current smoker so would like to avoid hormones We will trial paroxetine 10 mg daily

## 2021-11-27 NOTE — Progress Notes (Signed)
 Subjective:    Patient ID: Brandi Calderon, female    DOB: 02/23/1966, 56 y.o.   MRN: [id number]  HPI  Patient presents to clinic today with complaint of decreased sex drive. She has a history of anxiety and depression, treated with Clonazepam as needed. She feels like she has some days that are better than others. She is having hot flashes, night sweats, mood swings and vaginal dryness. She denies painful intercourse. She has not had a menstrual cycle in 3 years.  Review of Systems     Past Medical History:  Diagnosis Date   Anxiety    Arthritis    Asthma    Back pain    COPD (chronic obstructive pulmonary disease) (HCC)    Depression    Diabetes mellitus    Headache    History of COVID-19    Hyperlipidemia    Hypertension    Tendonitis     Current Outpatient Medications  Medication Sig Dispense Refill   albuterol (VENTOLIN HFA) 108 (90 Base) MCG/ACT inhaler INHALE 1-2 PUFFS BY MOUTH EVERY 6 HOURS AS NEEDED FOR WHEEZE OR SHORTNESS OF BREATH 6.7 each 1   Aspirin-Salicylamide-Caffeine (BC HEADACHE POWDER PO) Take 4 packets by mouth daily as needed (pain).     atorvastatin (LIPITOR) 20 MG tablet TAKE 1 TABLET BY MOUTH DAILY AT 6 PM. 90 tablet 1   Blood Glucose Monitoring Suppl (ONETOUCH ULTRALINK) w/Device KIT 1 Device by Does not apply route daily. 1 kit 0   clonazePAM (KLONOPIN) 0.5 MG tablet TAKE 1 TABLET BY MOUTH EVERY DAY AS NEEDED FOR ANXIETY 30 tablet 0   fluticasone (FLONASE) 50 MCG/ACT nasal spray SPRAY 2 SPRAYS INTO EACH NOSTRIL EVERY DAY 16 mL 5   furosemide (LASIX) 40 MG tablet TAKE 1 TABLET BY MOUTH EVERY DAY 90 tablet 0   gabapentin (NEURONTIN) 300 MG capsule Take 300 mg by mouth 3 (three) times daily as needed (pain).     glipiZIDE (GLUCOTROL XL) 5 MG 24 hr tablet Take 5 mg by mouth daily.     glucose blood test strip Use as instructed 100 each 0   JARDIANCE 25 MG TABS tablet Take 25 mg by mouth daily.     Lancets (ONETOUCH ULTRASOFT) lancets Use as instructed  100 each 12   metFORMIN (GLUCOPHAGE) 1000 MG tablet TAKE 1 TABLET (1,000 MG TOTAL) BY MOUTH 2 (TWO) TIMES DAILY WITH A MEAL. 60 tablet 0   OXYGEN Inhale 1 L into the lungs at bedtime.     pantoprazole (PROTONIX) 40 MG tablet TAKE 1 TABLET BY MOUTH EVERY DAY 90 tablet 1   Semaglutide, 2 MG/DOSE, 8 MG/3ML SOPN Inject 1 mg into the skin once a week.     TRELEGY ELLIPTA 100-62.5-25 MCG/ACT AEPB Inhale 1 puff into the lungs daily. 1 each 11   No current facility-administered medications for this visit.    Allergies  Allergen Reactions   Penicillins Anaphylaxis    Has patient had a PCN reaction causing immediate rash, facial/tongue/throat swelling, SOB or lightheadedness with hypotension: Yes Has patient had a PCN reaction causing severe rash involving mucus membranes or skin necrosis: No Has patient had a PCN reaction that required hospitalization: No Has patient had a PCN reaction occurring within the last 10 years: No If all of the above answers are "NO", then may proceed with Cephalosporin use.   Throat swells   Victoza [Liraglutide]     Severe nausea   Vicodin [Hydrocodone-Acetaminophen] Rash    And hives      Family History  Problem Relation Age of Onset   Kidney disease Father    Diabetes Father    Dementia Father    Diabetes Sister    Diabetes Brother    Diabetes Mother    Diabetes Brother     Social History   Socioeconomic History   Marital status: Divorced    Spouse name: Not on file   Number of children: Not on file   Years of education: Not on file   Highest education level: Not on file  Occupational History   Not on file  Tobacco Use   Smoking status: Every Day    Packs/day: 1.50    Years: 39.00    Total pack years: 58.50    Types: Cigarettes   Smokeless tobacco: Never   Tobacco comments:    2PPD 10/10/2020  Vaping Use   Vaping Use: Never used  Substance and Sexual Activity   Alcohol use: No   Drug use: Yes    Types: Marijuana    Comment: States she  smokes MJ when she does not have any Tramadol.   Sexual activity: Not on file  Other Topics Concern   Not on file  Social History Narrative   ** Merged History Encounter ** In the process of getting SSD for depression, DM and neuropathy      Active smoker [~ 2p/day; cutting down]; no alcohol; lives in [place]; self. Used to work in mill.    Social Determinants of Health   Financial Resource Strain: Low Risk  (01/15/2021)   Overall Financial Resource Strain (CARDIA)    Difficulty of Paying Living Expenses: Not hard at all  Food Insecurity: No Food Insecurity (01/15/2021)   Hunger Vital Sign    Worried About Running Out of Food in the Last Year: Never true    Ran Out of Food in the Last Year: Never true  Transportation Needs: No Transportation Needs (01/15/2021)   PRAPARE - Transportation    Lack of Transportation (Medical): No    Lack of Transportation (Non-Medical): No  Physical Activity: Inactive (01/15/2021)   Exercise Vital Sign    Days of Exercise per Week: 0 days    Minutes of Exercise per Session: 0 min  Stress: No Stress Concern Present (01/15/2021)   [employer] - Occupational Stress Questionnaire    Feeling of Stress : Only a little  Social Connections: Socially Isolated (01/15/2021)   Social Connection and Isolation Panel [NHANES]    Frequency of Communication with Friends and Family: More than three times a week    Frequency of Social Gatherings with Friends and Family: More than three times a week    Attends Religious Services: Never    Active Member of Clubs or Organizations: No    Attends Club or Organization Meetings: Never    Marital Status: Divorced  Intimate Partner Violence: Not At Risk (01/15/2021)   Humiliation, Afraid, Rape, and Kick questionnaire    Fear of Current or Ex-Partner: No    Emotionally Abused: No    Physically Abused: No    Sexually Abused: No     Constitutional: Denies fever, malaise, fatigue, headache  or abrupt weight changes.  Respiratory: Denies difficulty breathing, shortness of breath, cough or sputum production.   Cardiovascular: Denies chest pain, chest tightness, palpitations or swelling in the hands or feet.  Gastrointestinal: Denies abdominal pain, bloating, constipation, diarrhea or blood in the stool.  GU: Pt reports decrease libido, vaginal dryness. Denies urgency, frequency, pain with   urination, burning sensation, blood in urine, odor or discharge. Neurological: Pt reports hot flashes, night sweats. Denies dizziness, difficulty with memory, difficulty with speech or problems with balance and coordination.  Psych: Pt has a history of anxiety and depression. Denies SI/HI.  No other specific complaints in a complete review of systems (except as listed in HPI above).  Objective:   Physical Exam  BP 106/72 (BP Location: Left Arm, Patient Position: Sitting, Cuff Size: Normal)   Pulse 77   Temp (!) 96.8 F (36 C) (Temporal)   Wt 132 lb (59.9 kg)   SpO2 99%   BMI 24.94 kg/m   Wt Readings from Last 3 Encounters:  07/17/21 132 lb (59.9 kg)  06/14/21 128 lb (58.1 kg)  04/19/21 131 lb 9.6 oz (59.7 kg)    General: Appears her stated age, well developed, well nourished in NAD. Skin: Warm, dry and intact.  Cardiovascular: Normal rate and rhythm. S1,S2 noted.  No murmur, rubs or gallops noted Pulmonary/Chest: Normal effort and positive vesicular breath sounds. No respiratory distress. No wheezes, rales or ronchi noted.  Neurological: Alert and oriented.  Psychiatric: Mood and affect normal. Behavior is normal. Judgment and thought content normal.    BMET    Component Value Date/Time   NA 139 09/11/2020 1448   NA 139 06/25/2017 1027   NA 134 (L) 12/07/2012 1809   K 3.9 09/11/2020 1448   K 3.4 (L) 12/07/2012 1809   CL 96 (L) 09/11/2020 1448   CL 103 12/07/2012 1809   CO2 33 (H) 09/11/2020 1448   CO2 25 12/07/2012 1809   GLUCOSE 124 (H) 09/11/2020 1448   GLUCOSE 274 (H)  12/07/2012 1809   BUN 14 09/11/2020 1448   BUN 10 06/25/2017 1027   BUN 10 12/07/2012 1809   CREATININE 0.83 09/11/2020 1448   CREATININE 0.81 09/07/2019 1019   CALCIUM 9.5 09/11/2020 1448   CALCIUM 8.6 12/07/2012 1809   GFRNONAA >60 09/11/2020 1448   GFRNONAA 84 09/07/2019 1019   GFRAA 97 09/07/2019 1019    Lipid Panel     Component Value Date/Time   CHOL 205 (H) 01/04/2019 0929   CHOL 120 12/24/2016 1827   TRIG 87 01/04/2019 0929   HDL 40 (L) 01/04/2019 0929   HDL 29 (L) 12/24/2016 1827   CHOLHDL 5.1 (H) 01/04/2019 0929   LDLCALC 146 (H) 01/04/2019 0929    CBC    Component Value Date/Time   WBC 10.4 09/11/2020 1448   RBC 4.64 09/11/2020 1448   HGB 13.5 09/11/2020 1448   HGB 12.8 06/25/2017 1027   HCT 40.4 09/11/2020 1448   HCT 38.3 06/25/2017 1027   PLT 285 09/11/2020 1448   PLT 348 06/25/2017 1027   MCV 87.1 09/11/2020 1448   MCV 90 06/25/2017 1027   MCV 80 12/07/2012 1809   MCH 29.1 09/11/2020 1448   MCHC 33.4 09/11/2020 1448   RDW 15.1 09/11/2020 1448   RDW 15.0 06/25/2017 1027   RDW 17.8 (H) 12/07/2012 1809   LYMPHSABS 3.3 09/11/2020 1448   LYMPHSABS 3.3 (H) 09/11/2016 1131   MONOABS 0.5 09/11/2020 1448   EOSABS 0.3 09/11/2020 1448   EOSABS 0.2 09/11/2016 1131   BASOSABS 0.1 09/11/2020 1448   BASOSABS 0.0 09/11/2016 1131    Hgb A1C Lab Results  Component Value Date   HGBA1C 7.7 (A) 09/07/2019   HGBA1C 7.7 09/07/2019   HGBA1C 7.7 (A) 09/07/2019           Assessment & Plan:     RTC in 6 weeks for your annual exam  , NP  

## 2021-11-28 ENCOUNTER — Ambulatory Visit (AMBULATORY_SURGERY_CENTER): Payer: Medicare HMO | Admitting: *Deleted

## 2021-11-28 ENCOUNTER — Other Ambulatory Visit: Payer: Self-pay

## 2021-11-28 VITALS — Ht 61.0 in | Wt 130.0 lb

## 2021-11-28 DIAGNOSIS — Z86010 Personal history of colonic polyps: Secondary | ICD-10-CM

## 2021-11-28 MED ORDER — PEG 3350-KCL-NA BICARB-NACL 420 G PO SOLR: 4000.0000 mL | Freq: Once | ORAL | 0 refills | Status: AC

## 2021-11-28 NOTE — Progress Notes (Signed)
Pre visit completed in person Patient denies any constipation. Has a BM daily, just a light eater. Did not prescribe a 2 day prep.   No egg or soy allergy known to patient  No issues known to pt with past sedation with any surgeries or procedures Patient denies ever being told they had issues or difficulty with intubation  No FH of Malignant Hyperthermia Pt is not on diet pills Pt is not on  home 02  Pt is not on blood thinners  Pt denies issues with constipation  No A fib or A flutter Pt instructed to use [email or web address] or GoodRx for a price reduction on prep

## 2021-12-06 DIAGNOSIS — Z794 Long term (current) use of insulin: Secondary | ICD-10-CM | POA: Diagnosis not present

## 2021-12-06 DIAGNOSIS — E119 Type 2 diabetes mellitus without complications: Secondary | ICD-10-CM | POA: Diagnosis not present

## 2021-12-06 LAB — LIPID PANEL
Cholesterol: 183 (ref 0–200)
HDL: 36 (ref 35–70)
LDL Cholesterol: 36
LDl/HDL Ratio: 5.1
Triglycerides: 161 — AB (ref 40–160)

## 2021-12-06 LAB — MICROALBUMIN / CREATININE URINE RATIO: Microalb Creat Ratio: 248.4

## 2021-12-06 LAB — HEMOGLOBIN A1C: Hemoglobin A1C: 7.9

## 2021-12-16 ENCOUNTER — Other Ambulatory Visit: Payer: Self-pay | Admitting: Internal Medicine

## 2021-12-17 ENCOUNTER — Other Ambulatory Visit: Payer: Self-pay | Admitting: Internal Medicine

## 2021-12-17 DIAGNOSIS — E1169 Type 2 diabetes mellitus with other specified complication: Secondary | ICD-10-CM

## 2021-12-17 DIAGNOSIS — E118 Type 2 diabetes mellitus with unspecified complications: Secondary | ICD-10-CM

## 2021-12-17 DIAGNOSIS — K219 Gastro-esophageal reflux disease without esophagitis: Secondary | ICD-10-CM

## 2021-12-17 NOTE — Telephone Encounter (Signed)
Requested Prescriptions  Pending Prescriptions Disp Refills  . furosemide (LASIX) 40 MG tablet [Pharmacy Med Name: FUROSEMIDE 40 MG TABLET] 90 tablet 0    Sig: TAKE 1 TABLET BY MOUTH EVERY DAY     Cardiovascular:  Diuretics - Loop Failed - 12/16/2021  9:20 AM      Failed - K in normal range and within 180 days    Potassium  Date Value Ref Range Status  09/11/2020 3.9 3.5 - 5.1 mmol/L Final  12/07/2012 3.4 (L) 3.5 - 5.1 mmol/L Final         Failed - Ca in normal range and within 180 days    Calcium  Date Value Ref Range Status  09/11/2020 9.5 8.9 - 10.3 mg/dL Final   Calcium, Total  Date Value Ref Range Status  12/07/2012 8.6 8.5 - 10.1 mg/dL Final   Calcium, Ion  Date Value Ref Range Status  05/09/2010 1.11 (L) 1.12 - 1.32 mmol/L Final         Failed - Na in normal range and within 180 days    Sodium  Date Value Ref Range Status  09/11/2020 139 135 - 145 mmol/L Final  06/25/2017 139 134 - 144 mmol/L Final  12/07/2012 134 (L) 136 - 145 mmol/L Final         Failed - Cr in normal range and within 180 days    Creat  Date Value Ref Range Status  09/07/2019 0.81 0.50 - 1.05 mg/dL Final    Comment:    For patients >61 years of age, the reference limit for Creatinine is approximately 13% higher for people identified as African-American. .    Creatinine, Ser  Date Value Ref Range Status  09/11/2020 0.83 0.44 - 1.00 mg/dL Final         Failed - Cl in normal range and within 180 days    Chloride  Date Value Ref Range Status  09/11/2020 96 (L) 98 - 111 mmol/L Final  12/07/2012 103 98 - 107 mmol/L Final         Failed - Mg Level in normal range and within 180 days    Magnesium  Date Value Ref Range Status  09/05/2016 1.4 (L) 1.7 - 2.4 mg/dL Final         Passed - Last BP in normal range    BP Readings from Last 1 Encounters:  11/27/21 106/72         Passed - Valid encounter within last 6 months    Recent Outpatient Visits          2 weeks ago Menopausal  symptoms   [facility] [place], [place], [place]   5 months ago Mixed simple and mucopurulent chronic bronchitis [facility])   [facility], [name], NP   8 months ago Need for immunization against influenza   [place], [name], NP   11 months ago Medicare annual wellness visit, subsequent   [facility] [place], [place], NP   1 year ago Type 2 diabetes mellitus with complication, with long-term current use of insulin [facility])   [facility], [name], NP      Future Appointments            In 3 weeks Garnette Gunner, [name], NP [facility], [facility]

## 2021-12-18 ENCOUNTER — Encounter (HOSPITAL_COMMUNITY): Payer: Self-pay | Admitting: Gastroenterology

## 2021-12-18 NOTE — Telephone Encounter (Signed)
Requested medication (s) are due for refill today - yes  Requested medication (s) are on the active medication list -yes  Future visit scheduled -yes  Last refill: 11/19/21 #30  Notes to clinic: non delegated Rx  Requested Prescriptions  Pending Prescriptions Disp Refills   clonazePAM (KLONOPIN) 0.5 MG tablet [Pharmacy Med Name: CLONAZEPAM 0.5 MG TABLET] 30 tablet 0    Sig: TAKE 1 TABLET BY MOUTH EVERY DAY AS NEEDED FOR ANXIETY     Not Delegated - Psychiatry: Anxiolytics/Hypnotics 2 Failed - 12/17/2021  2:45 PM      Failed - This refill cannot be delegated      Failed - Urine Drug Screen completed in last 360 days      Passed - Patient is not pregnant      Passed - Valid encounter within last 6 months    Recent Outpatient Visits           3 weeks ago Menopausal symptoms   [facility] [place], [place] W, NP   5 months ago Mixed simple and mucopurulent chronic bronchitis ([place])   [facility], [name], NP   8 months ago Need for immunization against influenza   [facility] [place], [name], NP   11 months ago Medicare annual wellness visit, subsequent   [facility] [place], [place] W, NP   1 year ago Type 2 diabetes mellitus with complication, with long-term current use of insulin ([place])   [facility], [name], NP       Future Appointments             In 3 weeks Garnette Gunner, [name], NP [facility], [facility]            Signed Prescriptions Disp Refills   pantoprazole (PROTONIX) 40 MG tablet 90 tablet 1    Sig: TAKE 1 [place]     Gastroenterology: Proton Pump Inhibitors Passed - 12/17/2021  2:45 PM      Passed - Valid encounter within last 12 months    Recent Outpatient Visits           3 weeks ago Menopausal symptoms   [facility] [place], [place] W, NP   5 months ago Mixed simple and mucopurulent chronic bronchitis ([place])   [facility], [name], NP   8 months ago Need for immunization against influenza   [facility] [place], [name], NP   11 months ago Medicare annual wellness visit, subsequent   [facility] [place], [place] W, NP   1 year ago Type 2 diabetes mellitus with complication, with long-term current use of insulin ([place])   [facility], [name], NP       Future Appointments             In 3 weeks Garnette Gunner, [name], NP [facility], [facility]             fluticasone (FLONASE) 50 MCG/ACT nasal spray 48 mL 1    Sig: SPRAY 2 SPRAYS INTO EACH NOSTRIL EVERY DAY     Ear, Nose, and Throat: Nasal Preparations - Corticosteroids Passed - 12/17/2021  2:45 PM      Passed - Valid encounter within last 12 months    Recent Outpatient Visits           3 weeks ago Menopausal symptoms  [facility] [place], [place] W, NP   5 months ago Mixed simple and mucopurulent chronic bronchitis [facility])   [facility], [name], NP   8 months ago Need for immunization against influenza   [facility] [place], [name], NP   11 months ago [employer] annual wellness visit, subsequent   [facility] [place], [place] W, NP   1 year ago Type 2 diabetes mellitus with complication, with long-term current use of insulin ([place])   [facility], [name], NP       Future Appointments             In 3 weeks [place], [name], NP [facility], [facility]            Refused Prescriptions Disp Refills   atorvastatin (LIPITOR) 20 MG tablet [Pharmacy Med Name: ATORVASTATIN 20 MG TABLET] 90 tablet 1    Sig: TAKE 1 TABLET BY MOUTH DAILY AT 6 PM.     Cardiovascular:  Antilipid - Statins Failed - 12/17/2021  2:45 PM      Failed - Lipid Panel in normal range within the last 12 months    Cholesterol, Total  Date Value Ref Range Status  12/24/2016 120 100 - 199 mg/dL Final    Cholesterol  Date Value Ref Range Status  01/04/2019 205 (H) <200 mg/dL Final   LDL Cholesterol (Calc)  Date Value Ref Range Status  01/04/2019 146 (H) mg/dL (calc) Final    Comment:    Reference range: <100 . Desirable range <100 mg/dL for primary prevention;   <70 mg/dL for patients with CHD or diabetic patients  with > or = 2 CHD risk factors. [name] Kitchen LDL-C is now calculated using the Martin-Hopkins  calculation, which is a validated novel method providing  better accuracy than the Friedewald equation in the  estimation of LDL-C.  Cresenciano Genre et al. Annamaria Helling. 4665;993(57): 2061-2068  (http://education.Quest[facility].com/faq/FAQ164)    HDL  Date Value Ref Range Status  01/04/2019 40 (L) > OR = 50 mg/dL Final  12/24/2016 29 (L) >39 mg/dL Final   Triglycerides  Date Value Ref Range Status  01/04/2019 87 <150 mg/dL Final         Passed - Patient is not pregnant      Passed - Valid encounter within last 12 months    Recent Outpatient Visits           3 weeks ago Menopausal symptoms   [facility] [place], [place] W, NP   5 months ago Mixed simple and mucopurulent chronic bronchitis [facility])   [facility], [name], NP   8 months ago Need for immunization against influenza   [facility] [place], [name], NP   11 months ago Medicare annual wellness visit, subsequent   [facility] [place], [place] W, NP   1 year ago Type 2 diabetes mellitus with complication, with long-term current use of insulin [facility])   [facility] [place], [name], NP       Future Appointments             In 3 weeks Garnette Gunner, [name], NP [facility], [facility]               Requested Prescriptions  Pending Prescriptions Disp Refills   clonazePAM (KLONOPIN) 0.5 MG tablet [Pharmacy Med Name: CLONAZEPAM 0.5 MG TABLET] 30 tablet 0  Sig: TAKE 1 TABLET BY MOUTH EVERY DAY AS NEEDED FOR ANXIETY     Not Delegated -  Psychiatry: Anxiolytics/Hypnotics 2 Failed - 12/17/2021  2:45 PM      Failed - This refill cannot be delegated      Failed - Urine Drug Screen completed in last 360 days      Passed - Patient is not pregnant      Passed - Valid encounter within last 6 months    Recent Outpatient Visits           3 weeks ago Menopausal symptoms   [facility] [place], [place] W, NP   5 months ago Mixed simple and mucopurulent chronic bronchitis ([place])   [facility], [name], NP   8 months ago Need for immunization against influenza   [facility] [place], [name], NP   11 months ago Medicare annual wellness visit, subsequent   [facility] [place], [place] W, NP   1 year ago Type 2 diabetes mellitus with complication, with long-term current use of insulin ([place])   [facility], [name], NP       Future Appointments             In 3 weeks Garnette Gunner, [name], NP [facility], [facility]            Signed Prescriptions Disp Refills   pantoprazole (PROTONIX) 40 MG tablet 90 tablet 1    Sig: TAKE 1 [place]     Gastroenterology: Proton Pump Inhibitors Passed - 12/17/2021  2:45 PM      Passed - Valid encounter within last 12 months    Recent Outpatient Visits           3 weeks ago Menopausal symptoms   [facility] [place], [place] W, NP   5 months ago Mixed simple and mucopurulent chronic bronchitis ([place])   [facility], [name], NP   8 months ago Need for immunization against influenza   [facility] [place], [name], NP   11 months ago Medicare annual wellness visit, subsequent   [facility] [place], [place] W, NP   1 year ago Type 2 diabetes mellitus with complication, with long-term current use of insulin ([place])   [facility], [name], NP       Future Appointments             In 3 weeks Garnette Gunner,  [name], NP [facility], [facility]             fluticasone (FLONASE) 50 MCG/ACT nasal spray 48 mL 1    Sig: SPRAY 2 SPRAYS INTO EACH NOSTRIL EVERY DAY     Ear, Nose, and Throat: Nasal Preparations - Corticosteroids Passed - 12/17/2021  2:45 PM      Passed - Valid encounter within last 12 months    Recent Outpatient Visits           3 weeks ago Menopausal symptoms   [facility] [place], [place] W, NP   5 months ago Mixed simple and mucopurulent chronic bronchitis [facility])   [facility] [place], [name], NP   8 months ago Need for immunization against influenza   [facility] [place], [name], NP   11 months ago Medicare annual wellness visit, subsequent   [place]  [facility] [place], [place] W, NP   1 year ago Type 2 diabetes mellitus with complication, with long-term current use of insulin ([place])   [facility] [place], [name], NP       Future Appointments             In 3 weeks [place], [name], NP [facility]            Refused Prescriptions Disp Refills   atorvastatin (LIPITOR) 20 MG tablet [Pharmacy Med Name: ATORVASTATIN 20 MG TABLET] 90 tablet 1    Sig: TAKE 1 TABLET BY MOUTH DAILY AT 6 PM.     Cardiovascular:  Antilipid - Statins Failed - 12/17/2021  2:45 PM      Failed - Lipid Panel in normal range within the last 12 months    Cholesterol, Total  Date Value Ref Range Status  12/24/2016 120 100 - 199 mg/dL Final   Cholesterol  Date Value Ref Range Status  01/04/2019 205 (H) <200 mg/dL Final   LDL Cholesterol (Calc)  Date Value Ref Range Status  01/04/2019 146 (H) mg/dL (calc) Final    Comment:    Reference range: <100 . Desirable range <100 mg/dL for primary prevention;   <70 mg/dL for patients with CHD or diabetic patients  with > or = 2 CHD risk factors. [name] Kitchen LDL-C is now calculated using the Martin-Hopkins  calculation, which is a validated novel method  providing  better accuracy than the Friedewald equation in the  estimation of LDL-C.  Cresenciano Genre et al. Annamaria Helling. 3007;622(63): 2061-2068  (http://education.[facility].com/faq/FAQ164)    HDL  Date Value Ref Range Status  01/04/2019 40 (L) > OR = 50 mg/dL Final  12/24/2016 29 (L) >39 mg/dL Final   Triglycerides  Date Value Ref Range Status  01/04/2019 87 <150 mg/dL Final         Passed - Patient is not pregnant      Passed - Valid encounter within last 12 months    Recent Outpatient Visits           3 weeks ago Menopausal symptoms   [facility] [place], [place] W, NP   5 months ago Mixed simple and mucopurulent chronic bronchitis [facility])   [facility], [name], NP   8 months ago Need for immunization against influenza   [facility] [place], [name], NP   11 months ago Medicare annual wellness visit, subsequent   [facility] [place], [place] W, NP   1 year ago Type 2 diabetes mellitus with complication, with long-term current use of insulin [facility])   [facility] [place], [name], NP       Future Appointments             In 3 weeks Garnette Gunner, [name], NP [facility], [facility]

## 2021-12-18 NOTE — Telephone Encounter (Signed)
Atorvastatin-07/31/21 #90 1RF- fails lab protocol- already filled by office Requested Prescriptions  Pending Prescriptions Disp Refills  . atorvastatin (LIPITOR) 20 MG tablet [Pharmacy Med Name: ATORVASTATIN 20 MG TABLET] 90 tablet 1    Sig: TAKE 1 TABLET BY MOUTH DAILY AT 6 PM.     Cardiovascular:  Antilipid - Statins Failed - 12/17/2021  2:45 PM      Failed - Lipid Panel in normal range within the last 12 months    Cholesterol, Total  Date Value Ref Range Status  12/24/2016 120 100 - 199 mg/dL Final   Cholesterol  Date Value Ref Range Status  01/04/2019 205 (H) <200 mg/dL Final   LDL Cholesterol (Calc)  Date Value Ref Range Status  01/04/2019 146 (H) mg/dL (calc) Final    Comment:    Reference range: <100 . Desirable range <100 mg/dL for primary prevention;   <70 mg/dL for patients with CHD or diabetic patients  with > or = 2 CHD risk factors. [name] Kitchen LDL-C is now calculated using the Martin-Hopkins  calculation, which is a validated novel method providing  better accuracy than the Friedewald equation in the  estimation of LDL-C.  Cresenciano Genre et al. Annamaria Helling. 7096;283(66): 2061-2068  (http://education.[facility].com/faq/FAQ164)    HDL  Date Value Ref Range Status  01/04/2019 40 (L) > OR = 50 mg/dL Final  12/24/2016 29 (L) >39 mg/dL Final   Triglycerides  Date Value Ref Range Status  01/04/2019 87 <150 mg/dL Final         Passed - Patient is not pregnant      Passed - Valid encounter within last 12 months    Recent Outpatient Visits          3 weeks ago Menopausal symptoms   [facility] [place], [place] W, NP   5 months ago Mixed simple and mucopurulent chronic bronchitis [facility])   [facility], [name], NP   8 months ago Need for immunization against influenza   [facility] [place], [name], NP   11 months ago Medicare annual wellness visit, subsequent   [facility] [place], [place] W, NP   1 year ago  Type 2 diabetes mellitus with complication, with long-term current use of insulin [facility])   [facility] [place], [name], NP      Future Appointments            In 3 weeks Garnette Gunner, [name], NP [facility], [place]           . clonazePAM (KLONOPIN) 0.5 MG tablet [Pharmacy Med Name: CLONAZEPAM 0.5 MG TABLET] 30 tablet 0    Sig: TAKE 1 TABLET BY MOUTH EVERY DAY AS NEEDED FOR ANXIETY     Not Delegated - Psychiatry: Anxiolytics/Hypnotics 2 Failed - 12/17/2021  2:45 PM      Failed - This refill cannot be delegated      Failed - Urine Drug Screen completed in last 360 days      Passed - Patient is not pregnant      Passed - Valid encounter within last 6 months    Recent Outpatient Visits          3 weeks ago Menopausal symptoms   [facility] [place], [place] W, NP   5 months ago Mixed simple and mucopurulent chronic bronchitis [facility])   [facility] [place], [name], NP   8 months ago Need for immunization against influenza  [facility] [place], [name], NP   11 months ago [employer] annual wellness visit, subsequent   [facility] [place], [place] W, NP   1 year ago Type 2 diabetes mellitus with complication, with long-term current use of insulin ([place])   [facility], [name], NP      Future Appointments            In 3 weeks Garnette Gunner, [name], NP [facility], [facility]           Signed Prescriptions Disp Refills   pantoprazole (PROTONIX) 40 MG tablet 90 tablet 1    Sig: TAKE 1 [place]     Gastroenterology: Proton Pump Inhibitors Passed - 12/17/2021  2:45 PM      Passed - Valid encounter within last 12 months    Recent Outpatient Visits          3 weeks ago Menopausal symptoms   [facility] [place], [place] W, NP   5 months ago Mixed simple and mucopurulent chronic bronchitis ([place])   [facility], [name], NP    8 months ago Need for immunization against influenza   [facility] [place], [name], NP   11 months ago Medicare annual wellness visit, subsequent   [facility] [place], [place] W, NP   1 year ago Type 2 diabetes mellitus with complication, with long-term current use of insulin ([place])   [facility], [name], NP      Future Appointments            In 3 weeks Garnette Gunner, [name], NP [facility], [facility]            fluticasone (FLONASE) 50 MCG/ACT nasal spray 48 mL 1    Sig: SPRAY 2 SPRAYS INTO EACH NOSTRIL EVERY DAY     Ear, Nose, and Throat: Nasal Preparations - Corticosteroids Passed - 12/17/2021  2:45 PM      Passed - Valid encounter within last 12 months    Recent Outpatient Visits          3 weeks ago Menopausal symptoms   [facility] [place], [place] W, NP   5 months ago Mixed simple and mucopurulent chronic bronchitis [facility])   [facility], [name], NP   8 months ago Need for immunization against influenza   [facility] [place], [name], NP   11 months ago Medicare annual wellness visit, subsequent   [facility] [place], [place] W, NP   1 year ago Type 2 diabetes mellitus with complication, with long-term current use of insulin [facility])   [facility] [place], [name], NP      Future Appointments            In 3 weeks Garnette Gunner, [name], NP [facility], [facility]

## 2021-12-18 NOTE — Telephone Encounter (Signed)
Requested Prescriptions  Pending Prescriptions Disp Refills  . pantoprazole (PROTONIX) 40 MG tablet [Pharmacy Med Name: PANTOPRAZOLE SOD DR 40 MG TAB] 90 tablet 1    Sig: TAKE 1 TABLET BY MOUTH EVERY DAY     Gastroenterology: Proton Pump Inhibitors Passed - 12/17/2021  2:45 PM      Passed - Valid encounter within last 12 months    Recent Outpatient Visits          3 weeks ago Menopausal symptoms   [facility] [place], [place] W, NP   5 months ago Mixed simple and mucopurulent chronic bronchitis [facility])   [facility], [name], NP   8 months ago Need for immunization against influenza   [facility] [place], [name], NP   11 months ago Medicare annual wellness visit, subsequent   [facility] [place], [place] W, NP   1 year ago Type 2 diabetes mellitus with complication, with long-term current use of insulin [facility])   [facility] [place], [name], NP      Future Appointments            In 3 weeks Garnette Gunner, [name], NP [facility] [place]           . fluticasone (FLONASE) 50 MCG/ACT nasal spray [Pharmacy Med Name: FLUTICASONE PROP 50 MCG SPRAY] 48 mL 1    Sig: SPRAY 2 SPRAYS INTO EACH NOSTRIL EVERY DAY     Ear, Nose, and Throat: Nasal Preparations - Corticosteroids Passed - 12/17/2021  2:45 PM      Passed - Valid encounter within last 12 months    Recent Outpatient Visits          3 weeks ago Menopausal symptoms   [facility] [place], [place] W, NP   5 months ago Mixed simple and mucopurulent chronic bronchitis [facility])   [facility], [name], NP   8 months ago Need for immunization against influenza   [facility] [place], [name], NP   11 months ago Medicare annual wellness visit, subsequent   [facility] [place], [place] W, NP   1 year ago Type 2 diabetes mellitus with complication, with long-term current use of insulin [facility])    [facility] [place], [name], NP      Future Appointments            In 3 weeks Garnette Gunner, [name], NP [facility], [place]           . atorvastatin (LIPITOR) 20 MG tablet [Pharmacy Med Name: ATORVASTATIN 20 MG TABLET] 90 tablet 1    Sig: TAKE 1 TABLET BY MOUTH DAILY AT 6 PM.     Cardiovascular:  Antilipid - Statins Failed - 12/17/2021  2:45 PM      Failed - Lipid Panel in normal range within the last 12 months    Cholesterol, Total  Date Value Ref Range Status  12/24/2016 120 100 - 199 mg/dL Final   Cholesterol  Date Value Ref Range Status  01/04/2019 205 (H) <200 mg/dL Final   LDL Cholesterol (Calc)  Date Value Ref Range Status  01/04/2019 146 (H) mg/dL (calc) Final    Comment:    Reference range: <100 . Desirable range <100 mg/dL for primary prevention;   <70 mg/dL for patients with CHD or diabetic patients  with > or = 2 CHD risk factors. [name] Kitchen LDL-C  is now calculated using the Martin-Hopkins  calculation, which is a validated novel method providing  better accuracy than the Friedewald equation in the  estimation of LDL-C.  Cresenciano Genre et al. Annamaria Helling. 3875;643(32): 2061-2068  (http://education.[facility].com/faq/FAQ164)    HDL  Date Value Ref Range Status  01/04/2019 40 (L) > OR = 50 mg/dL Final  12/24/2016 29 (L) >39 mg/dL Final   Triglycerides  Date Value Ref Range Status  01/04/2019 87 <150 mg/dL Final         Passed - Patient is not pregnant      Passed - Valid encounter within last 12 months    Recent Outpatient Visits          3 weeks ago Menopausal symptoms   [facility] [place], [place] W, NP   5 months ago Mixed simple and mucopurulent chronic bronchitis [facility])   [facility], [name], NP   8 months ago Need for immunization against influenza   [facility] [place], [name], NP   11 months ago Medicare annual wellness visit, subsequent   [facility] [place],  [place] W, NP   1 year ago Type 2 diabetes mellitus with complication, with long-term current use of insulin [facility])   [facility] [place], [name], NP      Future Appointments            In 3 weeks Garnette Gunner, [name], NP [facility], [place]           . clonazePAM (KLONOPIN) 0.5 MG tablet [Pharmacy Med Name: CLONAZEPAM 0.5 MG TABLET] 30 tablet 0    Sig: TAKE 1 TABLET BY MOUTH EVERY DAY AS NEEDED FOR ANXIETY     Not Delegated - Psychiatry: Anxiolytics/Hypnotics 2 Failed - 12/17/2021  2:45 PM      Failed - This refill cannot be delegated      Failed - Urine Drug Screen completed in last 360 days      Passed - Patient is not pregnant      Passed - Valid encounter within last 6 months    Recent Outpatient Visits          3 weeks ago Menopausal symptoms   [facility] [place], [place] W, NP   5 months ago Mixed simple and mucopurulent chronic bronchitis [facility])   [facility], [name], NP   8 months ago Need for immunization against influenza   [facility] [place], [name], NP   11 months ago Medicare annual wellness visit, subsequent   [facility] [place], [place] W, NP   1 year ago Type 2 diabetes mellitus with complication, with long-term current use of insulin [facility])   [facility] [place], [name], NP      Future Appointments            In 3 weeks Garnette Gunner, [name], NP [facility], [facility]

## 2021-12-19 ENCOUNTER — Telehealth: Payer: Self-pay

## 2021-12-19 ENCOUNTER — Encounter (HOSPITAL_COMMUNITY): Payer: Self-pay | Admitting: Physician Assistant

## 2021-12-19 NOTE — Telephone Encounter (Signed)
Received a secure chat from [place], [place], [place] with [facility] anesthesia stating patient will need pulmonary clearance d/t medical history and oxygen use. Message has been sent to Dr. Mortimer Fries with [facility] Pulmon[facility].

## 2021-12-20 ENCOUNTER — Telehealth: Payer: Self-pay

## 2021-12-20 DIAGNOSIS — F17210 Nicotine dependence, cigarettes, uncomplicated: Secondary | ICD-10-CM | POA: Diagnosis not present

## 2021-12-20 DIAGNOSIS — E1142 Type 2 diabetes mellitus with diabetic polyneuropathy: Secondary | ICD-10-CM | POA: Diagnosis not present

## 2021-12-20 DIAGNOSIS — R809 Proteinuria, unspecified: Secondary | ICD-10-CM | POA: Diagnosis not present

## 2021-12-20 DIAGNOSIS — E1129 Type 2 diabetes mellitus with other diabetic kidney complication: Secondary | ICD-10-CM | POA: Diagnosis not present

## 2021-12-20 DIAGNOSIS — E782 Mixed hyperlipidemia: Secondary | ICD-10-CM | POA: Diagnosis not present

## 2021-12-20 DIAGNOSIS — R69 Illness, unspecified: Secondary | ICD-10-CM | POA: Diagnosis not present

## 2021-12-20 NOTE — Telephone Encounter (Signed)
Received surgical assessment form from [facility] for colonoscopy. Procedure is scheduled 12/24/2021. Patient last seen 10/10/2020. Appt is needed for assessment.   ATC patient to schedule-unable to leave vm due to mailbox being full.  Will call back.

## 2021-12-21 NOTE — Telephone Encounter (Signed)
Appt scheduled 12/31/2021 at 1:30.

## 2021-12-21 NOTE — Telephone Encounter (Signed)
Spoke with patient & made her aware that we will need to cancel her upcoming colonoscopy d/t not receiving pulmonology clearance. Fax received from Dr. Mortimer Fries states patient will need to schedule office visit. Patient has been added back to wait list & will contact her once she is cleared to proceed.

## 2021-12-21 NOTE — Telephone Encounter (Signed)
ATC patient x2- unable to leave vm due to mailbox being full.  Will call once more due to nature of call.

## 2021-12-23 DIAGNOSIS — J449 Chronic obstructive pulmonary disease, unspecified: Secondary | ICD-10-CM | POA: Diagnosis not present

## 2021-12-24 ENCOUNTER — Ambulatory Visit (HOSPITAL_COMMUNITY): Admission: RE | Admit: 2021-12-24 | Payer: Medicare HMO | Source: Home / Self Care | Admitting: Gastroenterology

## 2021-12-24 SURGERY — COLONOSCOPY WITH PROPOFOL
Anesthesia: Monitor Anesthesia Care

## 2021-12-31 ENCOUNTER — Telehealth: Payer: Self-pay

## 2021-12-31 ENCOUNTER — Ambulatory Visit (INDEPENDENT_AMBULATORY_CARE_PROVIDER_SITE_OTHER): Payer: Medicare HMO | Admitting: Student in an Organized Health Care Education/Training Program

## 2021-12-31 ENCOUNTER — Ambulatory Visit: Payer: Medicare HMO | Admitting: Student in an Organized Health Care Education/Training Program

## 2021-12-31 ENCOUNTER — Ambulatory Visit: Payer: Medicaid Other | Admitting: Pharmacist

## 2021-12-31 ENCOUNTER — Encounter: Payer: Self-pay | Admitting: Student in an Organized Health Care Education/Training Program

## 2021-12-31 VITALS — BP 110/68 | HR 69 | Temp 97.7°F | Ht 61.0 in | Wt 132.2 lb

## 2021-12-31 DIAGNOSIS — Z87891 Personal history of nicotine dependence: Secondary | ICD-10-CM | POA: Diagnosis not present

## 2021-12-31 DIAGNOSIS — Z01818 Encounter for other preprocedural examination: Secondary | ICD-10-CM

## 2021-12-31 DIAGNOSIS — J449 Chronic obstructive pulmonary disease, unspecified: Secondary | ICD-10-CM

## 2021-12-31 DIAGNOSIS — F17200 Nicotine dependence, unspecified, uncomplicated: Secondary | ICD-10-CM

## 2021-12-31 DIAGNOSIS — E1169 Type 2 diabetes mellitus with other specified complication: Secondary | ICD-10-CM

## 2021-12-31 MED ORDER — NICOTINE POLACRILEX 2 MG MT LOZG: 2.0000 mg | LOZENGE | OROMUCOSAL | 3 refills | Status: DC | PRN

## 2021-12-31 MED ORDER — NICOTINE 21 MG/24HR TD PT24: 21.0000 mg | MEDICATED_PATCH | TRANSDERMAL | 0 refills | Status: AC

## 2021-12-31 MED ORDER — NICOTINE 14 MG/24HR TD PT24: 14.0000 mg | MEDICATED_PATCH | TRANSDERMAL | 0 refills | Status: AC

## 2021-12-31 MED ORDER — NICOTINE 7 MG/24HR TD PT24: 7.0000 mg | MEDICATED_PATCH | TRANSDERMAL | 0 refills | Status: AC

## 2021-12-31 NOTE — Telephone Encounter (Signed)
-----   Message from Flora Lipps, MD sent at 12/31/2021 10:56 AM EDT ----- Regarding: RE: Clearance Hello, we do NOT clear patients for procedure, we do risk assessment Patient has moderate COPD FEV1 50%, she is mild/moderate risk for pulmonary complications She is medically optimized at this time  ----- Message ----- From: Carl Best, RN Sent: 12/19/2021   4:04 PM EDT To: Flora Lipps, MD Subject: Clearance                                      Hi Dr. Mortimer Fries, this patient is currently scheduled for a colonoscopy at [facility] with Dr. Tarri Glenn on 12/24/21. Per [facility], she will need pulmonology clearance due to her history of oxygen use & lung capicity.   Please advise if this patient is okay to proceed with the procedure on Monday, or if it will need to be rescheduled.  Thank you!  -Donita Brooks, RN

## 2021-12-31 NOTE — Progress Notes (Signed)
Synopsis: Referred in for pre-operative risk stratification and optimization by [name], NP  Assessment & Plan:   #COPD #Nicotine dependence, smoking #Pre-operative risk assessment  Has a history of COPD for which she is maintained on Trelegy (LAMA/LABA/ICS). Symptoms have been stable and she is compliant with her inhalers. Exam today is benign. Aside from her status as a current smoker, the patient is optimized from a pulmonary/COPD perspective. She is low risk for post procedural pulmonary complications/failure (3 points for age/1.6% per ARISCAT; Arozullah index not validated for females).  Patient counseled extensively re smoking cessation and is interested in attempting to quit/cut down. I will prescribe a combination of lozenges and patches to aid in smoking cessation.  - nicotine polacrilex (NICOTINE MINI) 2 MG lozenge; Take 1 lozenge (2 mg total) by mouth every 2 (two) hours as needed for smoking cessation.  Dispense: 72 lozenge; Refill: 3 - nicotine (NICODERM CQ - DOSED IN MG/24 HR) 7 mg/24hr patch; Place 1 patch (7 mg total) onto the skin daily for 14 days.  Dispense: 14 patch; Refill: 0 - nicotine (NICODERM CQ - DOSED IN MG/24 HOURS) 21 mg/24hr patch; Place 1 patch (21 mg total) onto the skin daily.  Dispense: 42 patch; Refill: 0 - nicotine (NICODERM CQ - DOSED IN MG/24 HOURS) 14 mg/24hr patch; Place 1 patch (14 mg total) onto the skin daily for 14 days.  Dispense: 14 patch; Refill: 0  -Follow up with pulmonologist Dr. Mortimer Fries.  I spent 30 minutes caring for this patient today, including preparing to see the patient, obtaining and/or reviewing separately obtained history, performing a medically appropriate examination and/or evaluation, counseling and educating the patient/family/caregiver, ordering medications, tests, or procedures, and documenting clinical information in the electronic health record  Armando Reichert, MD [facility] 12/31/2021 4:38 PM     End of visit medications:  Meds ordered this encounter  Medications   nicotine polacrilex (NICOTINE MINI) 2 MG lozenge    Sig: Take 1 lozenge (2 mg total) by mouth every 2 (two) hours as needed for smoking cessation.    Dispense:  72 lozenge    Refill:  3   nicotine (NICODERM CQ - DOSED IN MG/24 HR) 7 mg/24hr patch    Sig: Place 1 patch (7 mg total) onto the skin daily for 14 days.    Dispense:  14 patch    Refill:  0   nicotine (NICODERM CQ - DOSED IN MG/24 HOURS) 21 mg/24hr patch    Sig: Place 1 patch (21 mg total) onto the skin daily.    Dispense:  42 patch    Refill:  0   nicotine (NICODERM CQ - DOSED IN MG/24 HOURS) 14 mg/24hr patch    Sig: Place 1 patch (14 mg total) onto the skin daily for 14 days.    Dispense:  14 patch    Refill:  0     Current Outpatient Medications:    albuterol (VENTOLIN HFA) 108 (90 Base) MCG/ACT inhaler, INHALE 1-2 PUFFS BY MOUTH EVERY 6 HOURS AS NEEDED FOR WHEEZE OR SHORTNESS OF BREATH, Disp: 6.7 each, Rfl: 1   Aspirin-Salicylamide-Caffeine (BC HEADACHE POWDER PO), Take 4 packets by mouth daily as needed (pain)., Disp: , Rfl:    atorvastatin (LIPITOR) 40 MG tablet, Take 40 mg by mouth daily., Disp: , Rfl:    Blood Glucose Monitoring Suppl (ONETOUCH ULTRALINK) w/Device KIT, 1 Device by Does not apply route daily., Disp: 1 kit, Rfl: 0   buPROPion (WELLBUTRIN XL) 150  MG 24 hr tablet, Take 150 mg by mouth every morning., Disp: , Rfl:    clonazePAM (KLONOPIN) 0.5 MG tablet, TAKE 1 TABLET BY MOUTH EVERY DAY AS NEEDED FOR ANXIETY, Disp: 30 tablet, Rfl: 0   fluticasone (FLONASE) 50 MCG/ACT nasal spray, SPRAY 2 SPRAYS INTO EACH NOSTRIL EVERY DAY, Disp: 48 mL, Rfl: 1   furosemide (LASIX) 40 MG tablet, TAKE 1 TABLET BY MOUTH EVERY DAY, Disp: 90 tablet, Rfl: 0   gabapentin (NEURONTIN) 300 MG capsule, Take 300 mg by mouth 3 (three) times daily as needed (pain)., Disp: , Rfl:    glipiZIDE (GLUCOTROL XL) 5 MG 24 hr tablet, Take 5 mg by mouth daily., Disp: ,  Rfl:    glucose blood test strip, Use as instructed, Disp: 100 each, Rfl: 0   JARDIANCE 25 MG TABS tablet, Take 25 mg by mouth daily., Disp: , Rfl:    Lancets (ONETOUCH ULTRASOFT) lancets, Use as instructed, Disp: 100 each, Rfl: 12   metFORMIN (GLUCOPHAGE) 1000 MG tablet, TAKE 1 TABLET (1,000 MG TOTAL) BY MOUTH 2 (TWO) TIMES DAILY WITH A MEAL., Disp: 60 tablet, Rfl: 0   nicotine (NICODERM CQ - DOSED IN MG/24 HOURS) 14 mg/24hr patch, Place 1 patch (14 mg total) onto the skin daily for 14 days., Disp: 14 patch, Rfl: 0   nicotine (NICODERM CQ - DOSED IN MG/24 HOURS) 21 mg/24hr patch, Place 1 patch (21 mg total) onto the skin daily., Disp: 42 patch, Rfl: 0   nicotine (NICODERM CQ - DOSED IN MG/24 HR) 7 mg/24hr patch, Place 1 patch (7 mg total) onto the skin daily for 14 days., Disp: 14 patch, Rfl: 0   nicotine polacrilex (NICOTINE MINI) 2 MG lozenge, Take 1 lozenge (2 mg total) by mouth every 2 (two) hours as needed for smoking cessation., Disp: 72 lozenge, Rfl: 3   OXYGEN, Inhale 1 L into the lungs at bedtime., Disp: , Rfl:    pantoprazole (PROTONIX) 40 MG tablet, TAKE 1 TABLET BY MOUTH EVERY DAY, Disp: 90 tablet, Rfl: 1   PARoxetine (PAXIL) 10 MG tablet, Take 1 tablet (10 mg total) by mouth daily., Disp: 90 tablet, Rfl: 1   Semaglutide, 2 MG/DOSE, 8 MG/3ML SOPN, Inject 1 mg into the skin once a week., Disp: , Rfl:    TRELEGY ELLIPTA 100-62.5-25 MCG/ACT AEPB, Inhale 1 puff into the lungs daily., Disp: 1 each, Rfl: 11   Subjective:   PATIENT ID: [name] GENDER: female DOB: 08/21/1965, MRN: [id number]  Chief Complaint  Patient presents with   Follow-up    Surgical clearance. No SOB. Normal cough. Still smoking.    HPI  Ms. Sutherland is a pleasant 56 year old female patient of Dr. Mortimer Fries with a history of COPD who presents to clinic for pre-operative risk stratification and optimization. She is to undergo a surveillance colonoscopy.  She reports feeling well. She has exertional shortness  of breath and a "smoker's" cough. The cough is non-productive. The exertional dyspnea is stable and has been present for over a year now. She noticed it after she had COVID. She had been seen by Dr. Mortimer Fries with PFT's showing obstructive lung disease and she was started on Trelegy. She is compliant with Trelegy and reports using albuterol 2-3 times daily in addition to it. She continues to use nocturnal oxygen. She never got her CPAP machine and is not using it.  She continues to smoke 1 pack a day and has a 50 pack year smoking history. She also reports  smoking marijuana (flower rolled in the form of joints with no filter). She denies vaping but has considered it.  Ancillary information including prior medications, full medical/surgical/family/social histories, and PFTs (when available) are listed below and have been reviewed.   Review of Systems  Constitutional:  Negative for chills and fever.  Respiratory:  Positive for cough and shortness of breath. Negative for hemoptysis, sputum production and wheezing.   Cardiovascular:  Negative for chest pain, palpitations, orthopnea and PND.     Objective:   Vitals:   12/31/21 1609  BP: 110/68  Pulse: 69  Temp: 97.7 F (36.5 C)  SpO2: 98%  Weight: 132 lb 3.2 oz (60 kg)  Height: 5' 1"  (1.549 m)   98% on RA  BMI Readings from Last 3 Encounters:  12/31/21 24.98 kg/m  11/28/21 24.56 kg/m  11/27/21 24.94 kg/m   Wt Readings from Last 3 Encounters:  12/31/21 132 lb 3.2 oz (60 kg)  11/28/21 130 lb (59 kg)  11/27/21 132 lb (59.9 kg)    Physical Exam Constitutional:      Appearance: Normal appearance. She is normal weight.  HENT:     Head: Atraumatic.     Mouth/Throat:     Mouth: Mucous membranes are moist.  Eyes:     Extraocular Movements: Extraocular movements intact.  Cardiovascular:     Rate and Rhythm: Normal rate and regular rhythm.     Heart sounds: Normal heart sounds.  Pulmonary:     Effort: Pulmonary effort is normal.      Breath sounds: Normal breath sounds.  Abdominal:     Palpations: Abdomen is soft.  Musculoskeletal:     Cervical back: Normal range of motion.  Neurological:     General: No focal deficit present.     Mental Status: She is alert and oriented to person, place, and time.  Psychiatric:        Mood and Affect: Mood normal.       Ancillary Information    Past Medical History:  Diagnosis Date   Anxiety    Arthritis    Asthma    Back pain    COPD (chronic obstructive pulmonary disease) ([place])    Depression    Diabetes mellitus    Headache    History of COVID-19    Hyperlipidemia    Hypertension    Neuromuscular disorder ([place])    Sleep apnea    Tendonitis      Family History  Problem Relation Age of Onset   Diabetes Mother    Kidney disease Father    Diabetes Father    Dementia Father    Diabetes Sister    Diabetes Brother    Diabetes Brother    Colon cancer Neg Hx    Esophageal cancer Neg Hx    Stomach cancer Neg Hx    Rectal cancer Neg Hx      Past Surgical History:  Procedure Laterality Date   CESAREAN SECTION     CESAREAN SECTION     3 times   COLONOSCOPY     cyst on neck     as a baby/right side    Social History   Socioeconomic History   Marital status: Divorced    Spouse name: Not on file   Number of children: Not on file   Years of education: Not on file   Highest education level: Not on file  Occupational History   Not on file  Tobacco Use   Smoking status: Every  Day    Packs/day: 1.50    Years: 39.00    Total pack years: 58.50    Types: Cigarettes   Smokeless tobacco: Never   Tobacco comments:    2PPD 10/10/2020  Vaping Use   Vaping Use: Never used  Substance and Sexual Activity   Alcohol use: No   Drug use: Yes    Types: Marijuana    Comment: States she smokes MJ when she does not have any Tramadol.   Sexual activity: Not on file  Other Topics Concern   Not on file  Social History Narrative   ** Merged History Encounter ** In  the process of getting SSD for depression, DM and neuropathy      Active smoker [~ 2p/day; cutting down]; no alcohol; lives in [place]; self. Used to work in [employer]    Social Determinants of Health   Financial Resource Strain: Low Risk  (01/15/2021)   Overall Financial Resource Strain (CARDIA)    Difficulty of Paying Living Expenses: Not hard at all  Food Insecurity: No Food Insecurity (01/15/2021)   Hunger Vital Sign    Worried About Running Out of Food in the Last Year: Never true    Ran Out of Food in the Last Year: Never true  Transportation Needs: No Transportation Needs (01/15/2021)   PRAPARE - Hydrologist (Medical): No    Lack of Transportation (Non-Medical): No  Physical Activity: Inactive (01/15/2021)   Exercise Vital Sign    Days of Exercise per Week: 0 days    Minutes of Exercise per Session: 0 min  Stress: No Stress Concern Present (01/15/2021)   [place]    Feeling of Stress : Only a little  Social Connections: Socially Isolated (01/15/2021)   Social Connection and Isolation Panel [NHANES]    Frequency of Communication with Friends and Family: More than three times a week    Frequency of Social Gatherings with Friends and Family: More than three times a week    Attends Religious Services: Never    Marine scientist or Organizations: No    Attends Archivist Meetings: Never    Marital Status: Divorced  Human resources officer Violence: Not At Risk (01/15/2021)   Humiliation, Afraid, Rape, and Kick questionnaire    Fear of Current or Ex-Partner: No    Emotionally Abused: No    Physically Abused: No    Sexually Abused: No     Allergies  Allergen Reactions   Penicillins Anaphylaxis    Has patient had a PCN reaction causing immediate rash, facial/tongue/throat swelling, SOB or lightheadedness with hypotension: Yes Has patient had a PCN reaction causing severe  rash involving mucus membranes or skin necrosis: No Has patient had a PCN reaction that required hospitalization: No Has patient had a PCN reaction occurring within the last 10 years: No If all of the above answers are "NO", then may proceed with Cephalosporin use.   Throat swells   Victoza [Liraglutide]     Severe nausea   Vicodin [Hydrocodone-Acetaminophen] Rash    And hives     CBC    Component Value Date/Time   WBC 10.4 09/11/2020 1448   RBC 4.64 09/11/2020 1448   HGB 13.5 09/11/2020 1448   HGB 12.8 06/25/2017 1027   HCT 40.4 09/11/2020 1448   HCT 38.3 06/25/2017 1027   PLT 285 09/11/2020 1448   PLT 348 06/25/2017 1027   MCV 87.1 09/11/2020 1448  MCV 90 06/25/2017 1027   MCV 80 12/07/2012 1809   MCH 29.1 09/11/2020 1448   MCHC 33.4 09/11/2020 1448   RDW 15.1 09/11/2020 1448   RDW 15.0 06/25/2017 1027   RDW 17.8 (H) 12/07/2012 1809   LYMPHSABS 3.3 09/11/2020 1448   LYMPHSABS 3.3 (H) 09/11/2016 1131   MONOABS 0.5 09/11/2020 1448   EOSABS 0.3 09/11/2020 1448   EOSABS 0.2 09/11/2016 1131   BASOSABS 0.1 09/11/2020 1448   BASOSABS 0.0 09/11/2016 1131    Pulmonary Functions Testing Results:     No data to display          Outpatient Medications Prior to Visit  Medication Sig Dispense Refill   albuterol (VENTOLIN HFA) 108 (90 Base) MCG/ACT inhaler INHALE 1-2 PUFFS BY MOUTH EVERY 6 HOURS AS NEEDED FOR WHEEZE OR SHORTNESS OF BREATH 6.7 each 1   Aspirin-Salicylamide-Caffeine (BC HEADACHE POWDER PO) Take 4 packets by mouth daily as needed (pain).     atorvastatin (LIPITOR) 40 MG tablet Take 40 mg by mouth daily.     Blood Glucose Monitoring Suppl (ONETOUCH ULTRALINK) w/Device KIT 1 Device by Does not apply route daily. 1 kit 0   buPROPion (WELLBUTRIN XL) 150 MG 24 hr tablet Take 150 mg by mouth every morning.     clonazePAM (KLONOPIN) 0.5 MG tablet TAKE 1 TABLET BY MOUTH EVERY DAY AS NEEDED FOR ANXIETY 30 tablet 0   fluticasone (FLONASE) 50 MCG/ACT nasal spray SPRAY  2 SPRAYS INTO EACH NOSTRIL EVERY DAY 48 mL 1   furosemide (LASIX) 40 MG tablet TAKE 1 TABLET BY MOUTH EVERY DAY 90 tablet 0   gabapentin (NEURONTIN) 300 MG capsule Take 300 mg by mouth 3 (three) times daily as needed (pain).     glipiZIDE (GLUCOTROL XL) 5 MG 24 hr tablet Take 5 mg by mouth daily.     glucose blood test strip Use as instructed 100 each 0   JARDIANCE 25 MG TABS tablet Take 25 mg by mouth daily.     Lancets (ONETOUCH ULTRASOFT) lancets Use as instructed 100 each 12   metFORMIN (GLUCOPHAGE) 1000 MG tablet TAKE 1 TABLET (1,000 MG TOTAL) BY MOUTH 2 (TWO) TIMES DAILY WITH A MEAL. 60 tablet 0   OXYGEN Inhale 1 L into the lungs at bedtime.     pantoprazole (PROTONIX) 40 MG tablet TAKE 1 TABLET BY MOUTH EVERY DAY 90 tablet 1   PARoxetine (PAXIL) 10 MG tablet Take 1 tablet (10 mg total) by mouth daily. 90 tablet 1   Semaglutide, 2 MG/DOSE, 8 MG/3ML SOPN Inject 1 mg into the skin once a week.     TRELEGY ELLIPTA 100-62.5-25 MCG/ACT AEPB Inhale 1 puff into the lungs daily. 1 each 11   No facility-administered medications prior to visit.

## 2021-12-31 NOTE — Chronic Care Management (AMB) (Signed)
Chief Complaint  Patient presents with   Care Coordination    Medication Adherence/T2DM/COPD    Brandi Calderon is a 56 y.o. year old female who presented for a telephone visit.   They were referred to the pharmacist by their PCP for assistance in managing diabetes and medication adherence.    Subjective:  Care Team: Primary Care Provider: [name], NP ; Next Scheduled Visit: 01/08/2022 Pulmonologist: [facility]; Next Scheduled Visit: today Endocrinologist: Dr. Gabriel Carina; Next Scheduled Visit: 04/04/2022  Medication Access/Adherence  Current Pharmacy:  [id number] [address]: [phone]: [phone] Current adherence strategy: weekly pillbox and daily phone alarms  Patient reports Fair adherence to medications  Patient reports the following barriers to adherence:  -Remembering to take Trelegy inhaler daily. Counsel on strategies to aid with adherence  Patient again interested in using pill packaging to manage medications States will [place] [facility] [place]  Di[facility]  Current medications:  Metformin 1000 mg twice daily Ozempic 1 mg weekly on Mondays Jardiance 25 mg daily glipizide ER 5 mg daily (reports restarted after received a call back from Endocrinology ~7/17) Medications tried in the past: Trulicity (stopped when changed to [place] Denies having recent blood sugar readings to share  Reports following specific instructions received from Endocrinologist on 12/20/2021 for slowly trying to increase Ozempic dose up again  Statin: atorvastatin 40 mg daily (as increased by Endocrinologist on 9/21)  COPD:  Current medications: Trelegy - 1 puff daily Albuterol - 1-2 puffs every 6 hours as  needed Overnight oxygen  Reports missing doses of Trelegy inhaler a couple of time/week. Reports rinsing mouth after each use and using rescue (albuterol) inhaler as needed as directed  Tobacco Abuse:  Tobacco Use History: Reports currently smoking ~1.5 packs/day Interested in quitting smoking, but not ready to set quit date today Reports family is her motivation for quitting. States her daughters have told her that they would quit smoking with her Triggers: boredom, others smoking around her, stress Denies having started nicotine patches, but has Step 1 (21 mg/day) patches Have counseled on use of patches   Objective: Lab Results  Component Value Date   HGBA1C 7.7 (A) 09/07/2019   HGBA1C 7.7 09/07/2019   HGBA1C 7.7 (A) 09/07/2019    Lab Results  Component Value Date   CREATININE 0.83 09/11/2020   BUN 14 09/11/2020   NA 139 09/11/2020   K 3.9 09/11/2020   CL 96 (L) 09/11/2020   CO2 33 (H) 09/11/2020    Lab Results  Component Value Date   CHOL 205 (H) 01/04/2019   HDL 40 (L) 01/04/2019   LDLCALC 146 (H) 01/04/2019   TRIG 87 01/04/2019   CHOLHDL 5.1 (H) 01/04/2019    Medications Reviewed Today     Reviewed by DRennis Petty RPH-CPP (Pharmacist) on 12/31/21 at 1058  Med List Status: <None>   Medication Order Taking? Sig Documenting Provider Last Dose Status Informant  albuterol (VENTOLIN HFA) 108 (90 Base) MCG/ACT inhaler [id number] INHALE 1-2 PUFFS BY MOUTH EVERY 6 HOURS AS NEEDED FOR WHEEZE OR SHORTNESS OF BREATH [name], RCoralie Keens NP  Active   Aspirin-Salicylamide-Caffeine (BC HEADACHE POWDER PO) [id number] Take 4 packets by mouth daily as needed (pain). [provider]  Active Self  atorvastatin (LIPITOR) 40 MG tablet [id number]  Take 40 mg by mouth daily. [provider]  Active   Blood Glucose Monitoring Suppl [facility] ULTRALINK) w/Device KIT [id number]  1 Device by Does not apply route daily. [name], NP  Active Self  buPROPion  (WELLBUTRIN XL) 150 MG 24 hr tablet [id number]  Take 150 mg by mouth every morning. [provider]  Active   clonazePAM (KLONOPIN) 0.5 MG tablet [id number]  TAKE 1 TABLET BY MOUTH EVERY DAY AS NEEDED FOR ANXIETY [name], [name], NP  Active   fluticasone [facility]) 50 MCG/ACT nasal spray [id number]  SPRAY 2 SPRAYS INTO EACH NOSTRIL EVERY DAY [name], NP  Active   furosemide (LASIX) 40 MG tablet [id number]  TAKE 1 TABLET BY MOUTH EVERY DAY [name], [name], NP  Active   gabapentin (NEURONTIN) 300 MG capsule [id number]  Take 300 mg by mouth 3 (three) times daily as needed (pain). [provider]  Active Self  glipiZIDE (GLUCOTROL XL) 5 MG 24 hr tablet [id number]  Take 5 mg by mouth daily. [provider]  Active Self  glucose blood test strip [id number]  Use as instructed [name], NP  Active Self  JARDIANCE 25 MG TABS tablet [id number]  Take 25 mg by mouth daily. [provider]  Active Self  Lancets [facility] ULTRASOFT) lancets [id number]  Use as instructed [name], NP  Active Self  metFORMIN (GLUCOPHAGE) 1000 MG tablet [id number]  TAKE 1 TABLET (1,000 MG TOTAL) BY MOUTH 2 (TWO) TIMES DAILY WITH A MEAL. [name], NP  Active Self  OXYGEN [id number]  Inhale 1 L into the lungs at bedtime. [provider]  Active Self  pantoprazole (PROTONIX) 40 MG tablet [id number]  TAKE 1 TABLET BY MOUTH EVERY DAY [name], [name], NP  Active   PARoxetine (PAXIL) 10 MG tablet [id number]  Take 1 tablet (10 mg total) by mouth daily. [name], NP  Active   Semaglutide, 2 MG/DOSE, 8 MG/3ML SOPN [id number]  Inject 1 mg into the skin once a week. [provider]  Active   TRELEGY ELLIPTA 100-62.5-25 MCG/ACT AEPB [id number]  Inhale 1 puff into the lungs daily. Olin Hauser, DO  Active Self  Med List Note [name], RN 06/25/18 7416): UDS 06-25-2018              Assessment/Plan:   Diabetes: - Currently uncontrolled - Reviewed  dietary modifications including  Counsel patient on importance of having regular well-balanced meals, while controlling carbohydrate portion sizes Again encourage patient to reduce intake of sugary beverages, to work on drinking water, rather than soda - Recommend to monitor home blood sugar as directed, keep log of results and have this to review at medical appointments and to call Endocrinologist sooner for reading outside of established parameters or symptoms   COPD: - Reviewed appropriate inhaler technique. - Recommend to focus on using Trelegy inhaler consistently every day    Tobacco Abuse - Currently uncontrolled - Provided motivational interviewing to assess tobacco use and strategies for reduction - Have provided information on 1 800 QUIT NOW support program  Medication Management: - Patient interested in use of pill pack to organize medications - Patient to follow up with [facility] to discuss having all of her prescriptions transferred from [place] and start pill packaging    Follow Up Plan: Clinic Pharmacist will follow up with patient again on 02/04/2022 at 11:30 AM  Wallace Cullens, PharmD,  [place] [phone]

## 2021-12-31 NOTE — Patient Instructions (Signed)
Goals Addressed             This Visit's Progress    Pharmacy Goals       Our goal A1c is less than 7%. This corresponds with fasting sugars less than 130 and 2 hour after meal sugars less than 180. Please check your blood sugar each morning  Our goal bad cholesterol, or LDL, is less than 70 . This is why it is important to continue taking your atorvastatin  Please follow up with the [place] Quitline for their help with quitting smoking.  The [place] Quitline phone number is: [phone]  Feel free to call me with any questions or concerns. I look forward to our next call!  Dusti Tetro Vitor Overbaugh, PharmD, BCACP Clinical Pharmacist [facility] [place] [phone]        

## 2021-12-31 NOTE — Patient Instructions (Signed)
The [facility] Quitline: Call 1-800-QUIT-NOW (940)271-3772). The [facility] Quitline is a free service for [employer]. Trained counselors are available from 8 am until 3 am, 365 days per year. Services are available in both [place] and [place].   Web Resources Free online support programs can help you track your progress and share experiences with others who are quitting. These are examples: www.becomeanex.org www.trytostop.org  www.smokefree.gov  www.[employer].com.br.aspx   Tobacco Cessation Medications  Nicotine Replacement Therapy (NRT)  Nicotine is the addictive part of tobacco smoke, but not the most dangerous part. There are 7000 other toxins in cigarettes, including carbon monoxide, that cause disease. People do not generally become addicted to medication. Common problems: People don't use enough medication or stop too early. Medications are safe and effective. Overdose is very uncommon. Use medications as long as needed (3 months minimum). Some combinations work better than single medications. Long acting medications like the NRT patch and bupropion provide continuous treatment for withdrawal symptoms.  PLUS  Short acting medications like the NRT gum, lozenge, inhaler, and nasal spray help people to cope with breakthrough cravings.  ? Nicotine Patch  Place patch on hairless skin on upper body, including arms and back. Each day: discard old patch, shower, apply new patch to a different site. Apply hydrocortisone cream to mildly red/irritated areas. Call provider if rash develops. If patch causes sleep disturbance, remove patch at bedtime and replace each morning after shower. Side effects may include: skin irritation, headache, insomnia, abnormal/vivid dreams.  ? Nicotine Lozenge  Allow to dissolve slowly in mouth (20-30 minutes). Do not chew or swallow. Nicotine release may cause a warm tingling sensation. Occasionally rotate to  different areas of the mouth. Use enough to control cravings, up to 20 lozenges per day (if used alone). Avoid eating or drinking for 15 minutes before using and during use. Side effects may include: nausea, hiccups, cough, heartburn, headache, gas, insomnia.

## 2022-01-08 ENCOUNTER — Encounter: Payer: Medicare HMO | Admitting: Internal Medicine

## 2022-01-08 NOTE — Progress Notes (Unsigned)
Subjective:    Patient ID: Brandi Calderon, female    DOB: 11-20-1965, 56 y.o.   MRN: [id number]  HPI  Patient presents to clinic today for her annual exam.  Flu: 10/2021 Tetanus: COVID: Pfizer x1 Pneumovax: Shingrix: Pap smear: 08/2019 Mammogram: Colon screening: 05/2018 Vision screening: Dentist:  Diet: Exercise:  Review of Systems     Past Medical History:  Diagnosis Date   Anxiety    Arthritis    Asthma    Back pain    COPD (chronic obstructive pulmonary disease) ([place])    Depression    Diabetes mellitus    Headache    History of COVID-19    Hyperlipidemia    Hypertension    Neuromuscular disorder ([place])    Sleep apnea    Tendonitis     Current Outpatient Medications  Medication Sig Dispense Refill   albuterol (VENTOLIN HFA) 108 (90 Base) MCG/ACT inhaler INHALE 1-2 PUFFS BY MOUTH EVERY 6 HOURS AS NEEDED FOR WHEEZE OR SHORTNESS OF BREATH 6.7 each 1   Aspirin-Salicylamide-Caffeine (BC HEADACHE POWDER PO) Take 4 packets by mouth daily as needed (pain).     atorvastatin (LIPITOR) 40 MG tablet Take 40 mg by mouth daily.     Blood Glucose Monitoring Suppl (ONETOUCH ULTRALINK) w/Device KIT 1 Device by Does not apply route daily. 1 kit 0   buPROPion (WELLBUTRIN XL) 150 MG 24 hr tablet Take 150 mg by mouth every morning.     clonazePAM (KLONOPIN) 0.5 MG tablet TAKE 1 TABLET BY MOUTH EVERY DAY AS NEEDED FOR ANXIETY 30 tablet 0   fluticasone (FLONASE) 50 MCG/ACT nasal spray SPRAY 2 SPRAYS INTO EACH NOSTRIL EVERY DAY 48 mL 1   furosemide (LASIX) 40 MG tablet TAKE 1 TABLET BY MOUTH EVERY DAY 90 tablet 0   gabapentin (NEURONTIN) 300 MG capsule Take 300 mg by mouth 3 (three) times daily as needed (pain).     glipiZIDE (GLUCOTROL XL) 5 MG 24 hr tablet Take 5 mg by mouth daily.     glucose blood test strip Use as instructed 100 each 0   JARDIANCE 25 MG TABS tablet Take 25 mg by mouth daily.     Lancets (ONETOUCH ULTRASOFT) lancets Use as instructed 100 each 12   metFORMIN  (GLUCOPHAGE) 1000 MG tablet TAKE 1 TABLET (1,000 MG TOTAL) BY MOUTH 2 (TWO) TIMES DAILY WITH A MEAL. 60 tablet 0   nicotine (NICODERM CQ - DOSED IN MG/24 HOURS) 14 mg/24hr patch Place 1 patch (14 mg total) onto the skin daily for 14 days. 14 patch 0   nicotine (NICODERM CQ - DOSED IN MG/24 HOURS) 21 mg/24hr patch Place 1 patch (21 mg total) onto the skin daily. 42 patch 0   nicotine (NICODERM CQ - DOSED IN MG/24 HR) 7 mg/24hr patch Place 1 patch (7 mg total) onto the skin daily for 14 days. 14 patch 0   nicotine polacrilex (NICOTINE MINI) 2 MG lozenge Take 1 lozenge (2 mg total) by mouth every 2 (two) hours as needed for smoking cessation. 72 lozenge 3   OXYGEN Inhale 1 L into the lungs at bedtime.     pantoprazole (PROTONIX) 40 MG tablet TAKE 1 TABLET BY MOUTH EVERY DAY 90 tablet 1   PARoxetine (PAXIL) 10 MG tablet Take 1 tablet (10 mg total) by mouth daily. 90 tablet 1   Semaglutide, 2 MG/DOSE, 8 MG/3ML SOPN Inject 1 mg into the skin once a week.     TRELEGY ELLIPTA 100-62.5-25 MCG/ACT AEPB  Inhale 1 puff into the lungs daily. 1 each 11   No current facility-administered medications for this visit.    Allergies  Allergen Reactions   Penicillins Anaphylaxis    Has patient had a PCN reaction causing immediate rash, facial/tongue/throat swelling, SOB or lightheadedness with hypotension: Yes Has patient had a PCN reaction causing severe rash involving mucus membranes or skin necrosis: No Has patient had a PCN reaction that required hospitalization: No Has patient had a PCN reaction occurring within the last 10 years: No If all of the above answers are "NO", then may proceed with Cephalosporin use.   Throat swells   Victoza [Liraglutide]     Severe nausea   Vicodin [Hydrocodone-Acetaminophen] Rash    And hives    Family History  Problem Relation Age of Onset   Diabetes Mother    Kidney disease Father    Diabetes Father    Dementia Father    Diabetes Sister    Diabetes Brother     Diabetes Brother    Colon cancer Neg Hx    Esophageal cancer Neg Hx    Stomach cancer Neg Hx    Rectal cancer Neg Hx     Social History   Socioeconomic History   Marital status: Divorced    Spouse name: Not on file   Number of children: Not on file   Years of education: Not on file   Highest education level: Not on file  Occupational History   Not on file  Tobacco Use   Smoking status: Every Day    Packs/day: 1.50    Years: 39.00    Total pack years: 58.50    Types: Cigarettes   Smokeless tobacco: Never   Tobacco comments:    2PPD 10/10/2020  Vaping Use   Vaping Use: Never used  Substance and Sexual Activity   Alcohol use: No   Drug use: Yes    Types: Marijuana    Comment: States she smokes MJ when she does not have any Tramadol.   Sexual activity: Not on file  Other Topics Concern   Not on file  Social History Narrative   ** Merged History Encounter ** In the process of getting SSD for depression, DM and neuropathy      Active smoker [~ 2p/day; cutting down]; no alcohol; lives in [place]; self. Used to work in [employer]    Social Determinants of Health   Financial Resource Strain: Low Risk  (01/15/2021)   Overall Financial Resource Strain (CARDIA)    Difficulty of Paying Living Expenses: Not hard at all  Food Insecurity: No Food Insecurity (01/15/2021)   Hunger Vital Sign    Worried About Running Out of Food in the Last Year: Never true    Ran Out of Food in the Last Year: Never true  Transportation Needs: No Transportation Needs (01/15/2021)   PRAPARE - Hydrologist (Medical): No    Lack of Transportation (Non-Medical): No  Physical Activity: Inactive (01/15/2021)   Exercise Vital Sign    Days of Exercise per Week: 0 days    Minutes of Exercise per Session: 0 min  Stress: No Stress Concern Present (01/15/2021)   [place]    Feeling of Stress : Only a little   Social Connections: Socially Isolated (01/15/2021)   Social Connection and Isolation Panel [NHANES]    Frequency of Communication with Friends and Family: More than three times a week  Frequency of Social Gatherings with Friends and Family: More than three times a week    Attends Religious Services: Never    Marine scientist or Organizations: No    Attends Archivist Meetings: Never    Marital Status: Divorced  Human resources officer Violence: Not At Risk (01/15/2021)   Humiliation, Afraid, Rape, and Kick questionnaire    Fear of Current or Ex-Partner: No    Emotionally Abused: No    Physically Abused: No    Sexually Abused: No     Constitutional: Denies fever, malaise, fatigue, headache or abrupt weight changes.  HEENT: Denies eye pain, eye redness, ear pain, ringing in the ears, wax buildup, runny nose, nasal congestion, bloody nose, or sore throat. Respiratory: Denies difficulty breathing, shortness of breath, cough or sputum production.   Cardiovascular: Denies chest pain, chest tightness, palpitations or swelling in the hands or feet.  Gastrointestinal: Denies abdominal pain, bloating, constipation, diarrhea or blood in the stool.  GU: Denies urgency, frequency, pain with urination, burning sensation, blood in urine, odor or discharge. Musculoskeletal: Denies decrease in range of motion, difficulty with gait, muscle pain or joint pain and swelling.  Skin: Denies redness, rashes, lesions or ulcercations.  Neurological: Patient reports neuropathy.  Denies dizziness, difficulty with memory, difficulty with speech or problems with balance and coordination.  Psych: Patient has a history of anxiety and depression.  Denies SI/HI.  No other specific complaints in a complete review of systems (except as listed in HPI above).  Objective:   Physical Exam    There were no vitals taken for this visit. Wt Readings from Last 3 Encounters:  12/31/21 132 lb 3.2 oz (60 kg)   11/28/21 130 lb (59 kg)  11/27/21 132 lb (59.9 kg)    General: Appears their stated age, well developed, well nourished in NAD. Skin: Warm, dry and intact. No rashes, lesions or ulcerations noted. HEENT: Head: normal shape and size; Eyes: sclera white, no icterus, conjunctiva pink, PERRLA and EOMs intact; Ears: Tm's gray and intact, normal light reflex; Nose: mucosa pink and moist, septum midline; Throat/Mouth: Teeth present, mucosa pink and moist, no exudate, lesions or ulcerations noted.  Neck:  Neck supple, trachea midline. No masses, lumps or thyromegaly present.  Cardiovascular: Normal rate and rhythm. S1,S2 noted.  No murmur, rubs or gallops noted. No JVD or BLE edema. No carotid bruits noted. Pulmonary/Chest: Normal effort and positive vesicular breath sounds. No respiratory distress. No wheezes, rales or ronchi noted.  Abdomen: Soft and nontender. Normal bowel sounds. No distention or masses noted. Liver, spleen and kidneys non palpable. Musculoskeletal: Normal range of motion. No signs of joint swelling. No difficulty with gait.  Neurological: Alert and oriented. Cranial nerves II-XII grossly intact. Coordination normal.  Psychiatric: Mood and affect normal. Behavior is normal. Judgment and thought content normal.     BMET    Component Value Date/Time   NA 139 09/11/2020 1448   NA 139 06/25/2017 1027   NA 134 (L) 12/07/2012 1809   K 3.9 09/11/2020 1448   K 3.4 (L) 12/07/2012 1809   CL 96 (L) 09/11/2020 1448   CL 103 12/07/2012 1809   CO2 33 (H) 09/11/2020 1448   CO2 25 12/07/2012 1809   GLUCOSE 124 (H) 09/11/2020 1448   GLUCOSE 274 (H) 12/07/2012 1809   BUN 14 09/11/2020 1448   BUN 10 06/25/2017 1027   BUN 10 12/07/2012 1809   CREATININE 0.83 09/11/2020 1448   CREATININE 0.81 09/07/2019 1019  CALCIUM 9.5 09/11/2020 1448   CALCIUM 8.6 12/07/2012 1809   GFRNONAA >60 09/11/2020 1448   GFRNONAA 84 09/07/2019 1019   GFRAA 97 09/07/2019 1019    Lipid Panel      Component Value Date/Time   CHOL 205 (H) 01/04/2019 0929   CHOL 120 12/24/2016 1827   TRIG 87 01/04/2019 0929   HDL 40 (L) 01/04/2019 0929   HDL 29 (L) 12/24/2016 1827   CHOLHDL 5.1 (H) 01/04/2019 0929   LDLCALC 146 (H) 01/04/2019 0929    CBC    Component Value Date/Time   WBC 10.4 09/11/2020 1448   RBC 4.64 09/11/2020 1448   HGB 13.5 09/11/2020 1448   HGB 12.8 06/25/2017 1027   HCT 40.4 09/11/2020 1448   HCT 38.3 06/25/2017 1027   PLT 285 09/11/2020 1448   PLT 348 06/25/2017 1027   MCV 87.1 09/11/2020 1448   MCV 90 06/25/2017 1027   MCV 80 12/07/2012 1809   MCH 29.1 09/11/2020 1448   MCHC 33.4 09/11/2020 1448   RDW 15.1 09/11/2020 1448   RDW 15.0 06/25/2017 1027   RDW 17.8 (H) 12/07/2012 1809   LYMPHSABS 3.3 09/11/2020 1448   LYMPHSABS 3.3 (H) 09/11/2016 1131   MONOABS 0.5 09/11/2020 1448   EOSABS 0.3 09/11/2020 1448   EOSABS 0.2 09/11/2016 1131   BASOSABS 0.1 09/11/2020 1448   BASOSABS 0.0 09/11/2016 1131    Hgb A1C Lab Results  Component Value Date   HGBA1C 7.7 (A) 09/07/2019   HGBA1C 7.7 09/07/2019   HGBA1C 7.7 (A) 09/07/2019          Assessment & Plan:   Preventative Health Maintenance:  Flu shot today She did [place] tetanus for financial reasons, advised if she gets bit or cut to get this done Encouraged her to get her COVID booster Pneumovax today Discussed Shingrix vaccine, she will check coverage with her insurance company and schedule a visit at the pharmacy if she would like to have this done Pap smear UTD Mammogram ordered-she will call to schedule Referral to GI for colon screening Encouraged her to consume a balanced diet and exercise regimen Advised her to see an eye doctor and dentist annually Recent labs reviewed  RTC in 6 months, follow-up chronic conditions [name], NP

## 2022-01-15 ENCOUNTER — Ambulatory Visit: Admission: RE | Admit: 2022-01-15 | Payer: Medicare HMO | Source: Ambulatory Visit

## 2022-01-16 ENCOUNTER — Encounter: Payer: Medicaid Other | Admitting: Internal Medicine

## 2022-01-17 ENCOUNTER — Other Ambulatory Visit: Payer: Self-pay | Admitting: Internal Medicine

## 2022-01-17 NOTE — Telephone Encounter (Signed)
Requested medication (s) are due for refill today: yes  Requested medication (s) are on the active medication list: yes  Last refill:  12/18/21 #30  Future visit scheduled: no  Notes to clinic:  med not delegated to NT to reorder   Requested Prescriptions  Pending Prescriptions Disp Refills   clonazePAM (KLONOPIN) 0.5 MG tablet [Pharmacy Med Name: CLONAZEPAM 0.5 MG TABLET] 30 tablet 0    Sig: TAKE 1 TABLET BY MOUTH EVERY DAY AS NEEDED FOR ANXIETY     Not Delegated - Psychiatry: Anxiolytics/Hypnotics 2 Failed - 01/17/2022  8:15 AM      Failed - This refill cannot be delegated      Failed - Urine Drug Screen completed in last 360 days      Passed - Patient is not pregnant      Passed - Valid encounter within last 6 months    Recent Outpatient Visits           2 weeks ago Type 2 diabetes mellitus with other specified complication, without long-term current use of insulin ([place])   [place], Grayland Ormond A, RPH-CPP   1 month ago Menopausal symptoms   [facility] [place], [place] W, NP   6 months ago Mixed simple and mucopurulent chronic bronchitis [facility])   [facility] [name], NP   9 months ago Need for immunization against influenza   [facility] [place], [name], NP   1 year ago Medicare annual wellness visit, subsequent   [facility] [place], [name], NP

## 2022-01-22 DIAGNOSIS — J449 Chronic obstructive pulmonary disease, unspecified: Secondary | ICD-10-CM | POA: Diagnosis not present

## 2022-02-01 ENCOUNTER — Ambulatory Visit (INDEPENDENT_AMBULATORY_CARE_PROVIDER_SITE_OTHER): Payer: Medicare HMO

## 2022-02-01 VITALS — Ht 61.0 in | Wt 132.0 lb

## 2022-02-01 DIAGNOSIS — Z1231 Encounter for screening mammogram for malignant neoplasm of breast: Secondary | ICD-10-CM

## 2022-02-01 DIAGNOSIS — Z0000 Encounter for general adult medical examination without abnormal findings: Secondary | ICD-10-CM

## 2022-02-01 NOTE — Progress Notes (Signed)
Virtual Visit via Telephone Note  I connected with  Terrina Docter Atiyeh on 02/01/22 at 12:00 PM EDT by telephone and verified that I am speaking with the correct person using two identifiers.  Location: Patient: home Provider: [facility] Persons participating in the virtual visit: Edgar   I discussed the limitations, risks, security and privacy concerns of performing an evaluation and management service by telephone and the availability of in person appointments. The patient expressed understanding and agreed to proceed.  Interactive audio and video telecommunications were attempted between this nurse and patient, however failed, due to patient having technical difficulties OR patient did not have access to video capability.  We continued and completed visit with audio only.  Some vital signs may be absent or patient reported.   Dionisio David, LPN  Subjective:   Faryal Marxen is a 56 y.o. female who presents for Medicare Annual (Subsequent) preventive examination.  Review of Systems     Cardiac Risk Factors include: advanced age (>48mn, >>10women);dyslipidemia;smoking/ tobacco exposure;diabetes mellitus     Objective:    Today's Vitals   02/01/22 1145  PainSc: 4    There is no height or weight on file to calculate BMI.     02/01/2022   11:53 AM 09/11/2020    2:18 PM 09/07/2019    9:06 AM 06/25/2018    8:23 AM 05/20/2018   11:21 AM 10/01/2017   12:06 PM 03/04/2017    9:32 AM  Advanced Directives  Does Patient Have a Medical Advance Directive? _0  No No  Would patient like information on creating a medical advance directive? No - Patient declined No - Patient declined Yes (ED - Information included in AVS) No - Patient declined  No - Patient declined     Current Medications (verified) Outpatient Encounter Medications as of 02/01/2022  Medication Sig   albuterol (VENTOLIN HFA) 108 (90 Base) MCG/ACT inhaler INHALE 1-2 PUFFS BY MOUTH EVERY 6  HOURS AS NEEDED FOR WHEEZE OR SHORTNESS OF BREATH   Aspirin-Salicylamide-Caffeine (BC HEADACHE POWDER PO) Take 4 packets by mouth daily as needed (pain).   atorvastatin (LIPITOR) 40 MG tablet Take 40 mg by mouth daily.   Blood Glucose Monitoring Suppl (ONETOUCH ULTRALINK) w/Device KIT 1 Device by Does not apply route daily.   buPROPion (WELLBUTRIN XL) 150 MG 24 hr tablet Take 150 mg by mouth every morning.   clonazePAM (KLONOPIN) 0.5 MG tablet TAKE 1 TABLET BY MOUTH EVERY DAY AS NEEDED FOR ANXIETY   fluticasone (FLONASE) 50 MCG/ACT nasal spray SPRAY 2 SPRAYS INTO EACH NOSTRIL EVERY DAY   furosemide (LASIX) 40 MG tablet TAKE 1 TABLET BY MOUTH EVERY DAY   gabapentin (NEURONTIN) 300 MG capsule Take 300 mg by mouth 3 (three) times daily as needed (pain).   glipiZIDE (GLUCOTROL XL) 5 MG 24 hr tablet Take 5 mg by mouth daily.   glucose blood test strip Use as instructed   JARDIANCE 25 MG TABS tablet Take 25 mg by mouth daily.   Lancets (ONETOUCH ULTRASOFT) lancets Use as instructed   metFORMIN (GLUCOPHAGE) 1000 MG tablet TAKE 1 TABLET (1,000 MG TOTAL) BY MOUTH 2 (TWO) TIMES DAILY WITH A MEAL.   OXYGEN Inhale 1 L into the lungs at bedtime.   pantoprazole (PROTONIX) 40 MG tablet TAKE 1 TABLET BY MOUTH EVERY DAY   Semaglutide, 2 MG/DOSE, 8 MG/3ML SOPN Inject 1 mg into the skin once a week.   TRELEGY ELLIPTA 100-62.5-25 MCG/ACT AEPB Inhale 1  puff into the lungs daily.   nicotine (NICODERM CQ - DOSED IN MG/24 HOURS) 21 mg/24hr patch Place 1 patch (21 mg total) onto the skin daily. (Patient not taking: Reported on 02/01/2022)   nicotine polacrilex (NICOTINE MINI) 2 MG lozenge Take 1 lozenge (2 mg total) by mouth every 2 (two) hours as needed for smoking cessation. (Patient not taking: Reported on 02/01/2022)   PARoxetine (PAXIL) 10 MG tablet Take 1 tablet (10 mg total) by mouth daily.   No facility-administered encounter medications on file as of 02/01/2022.    Allergies (verified) Penicillins, Victoza  [liraglutide], and Vicodin [hydrocodone-acetaminophen]   History: Past Medical History:  Diagnosis Date   Anxiety    Arthritis    Asthma    Back pain    COPD (chronic obstructive pulmonary disease) ([place])    Depression    Diabetes mellitus    Headache    History of COVID-19    Hyperlipidemia    Hypertension    Neuromuscular disorder ([place])    Sleep apnea    Tendonitis    Past Surgical History:  Procedure Laterality Date   CESAREAN SECTION     CESAREAN SECTION     3 times   COLONOSCOPY     cyst on neck     as a baby/right side   Family History  Problem Relation Age of Onset   Diabetes Mother    Kidney disease Father    Diabetes Father    Dementia Father    Diabetes Sister    Diabetes Brother    Diabetes Brother    Colon cancer Neg Hx    Esophageal cancer Neg Hx    Stomach cancer Neg Hx    Rectal cancer Neg Hx    Social History   Socioeconomic History   Marital status: Divorced    Spouse name: Not on file   Number of children: Not on file   Years of education: Not on file   Highest education level: Not on file  Occupational History   Not on file  Tobacco Use   Smoking status: Every Day    Packs/day: 1.50    Years: 39.00    Total pack years: 58.50    Types: Cigarettes   Smokeless tobacco: Never   Tobacco comments:    2PPD 10/10/2020  Vaping Use   Vaping Use: Never used  Substance and Sexual Activity   Alcohol use: No   Drug use: Yes    Types: Marijuana    Comment: States she smokes MJ when she does not have any Tramadol.   Sexual activity: Not on file  Other Topics Concern   Not on file  Social History Narrative   ** Merged History Encounter ** In the process of getting SSD for depression, DM and neuropathy      Active smoker [~ 2p/day; cutting down]; no alcohol; lives in [place]; self. Used to work in [employer]    Social Determinants of Health   Financial Resource Strain: Low Risk  (02/01/2022)   Overall Financial Resource Strain (CARDIA)     Difficulty of Paying Living Expenses: Not very hard  Food Insecurity: No Food Insecurity (02/01/2022)   Hunger Vital Sign    Worried About Running Out of Food in the Last Year: Never true    Ran Out of Food in the Last Year: Never true  Transportation Needs: No Transportation Needs (02/01/2022)   PRAPARE - Hydrologist (Medical): No    Lack  of Transportation (Non-Medical): No  Physical Activity: Insufficiently Active (02/01/2022)   Exercise Vital Sign    Days of Exercise per Week: 2 days    Minutes of Exercise per Session: 60 min  Stress: No Stress Concern Present (02/01/2022)   [place]    Feeling of Stress : Only a little  Social Connections: Socially Isolated (02/01/2022)   Social Connection and Isolation Panel [NHANES]    Frequency of Communication with Friends and Family: More than three times a week    Frequency of Social Gatherings with Friends and Family: More than three times a week    Attends Religious Services: Never    Marine scientist or Organizations: No    Attends Archivist Meetings: Never    Marital Status: Divorced    Tobacco Counseling Ready to quit: Not Answered Counseling given: Not Answered Tobacco comments: 2PPD 10/10/2020   Clinical Intake:  Pre-visit preparation completed: Yes  Pain : 0-10 Pain Score: 4  Pain Location: Back     Nutritional Risks: None Diabetes: Yes CBG done?: No Did pt. bring in CBG monitor from home?: No  How often do you need to have someone help you when you read instructions, pamphlets, or other written materials from your doctor or pharmacy?: 1 - Never  Diabetic?yes Nutrition Risk Assessment:  Has the patient had any N/V/D within the last 2 months?  Yes  Does the patient have any non-healing wounds?  No  Has the patient had any unintentional weight loss or weight gain?  No   Diabetes:  Is the patient  diabetic?  Yes  If diabetic, was a CBG obtained today?  No  Did the patient bring in their glucometer from home?  No  How often do you monitor your CBG's? occasionally.   Financial Strains and Diabetes Management:  Are you having any financial strains with the device, your supplies or your medication? No .  Does the patient want to be seen by Chronic Care Management for management of their diabetes?  No  Would the patient like to be referred to a Nutritionist or for Diabetic Management?  No   Diabetic Exams:  Diabetic Eye Exam: Completed 02/21/20. Overdue for diabetic eye exam. Pt has been advised about the importance in completing this exam.   Diabetic Foot Exam: Completed 01/16/21. Pt has been advised about the importance in completing this exam.   Interpreter Needed?: No  Information entered by :: Kirke Shaggy, LPN   Activities of Daily Living    02/01/2022   11:54 AM 07/17/2021    3:05 PM  In your present state of health, do you have any difficulty performing the following activities:  Hearing? 0 1  Vision? 0 0  Difficulty concentrating or making decisions? 0 1  Walking or climbing stairs? 1 1  Dressing or bathing? 0 0  Doing errands, shopping? 0 0  Preparing Food and eating ? N   Using the Toilet? N   In the past six months, have you accidently leaked urine? N   Do you have problems with loss of bowel control? N   Managing your Medications? N   Managing your Finances? N   Housekeeping or managing your Housekeeping? N     Patient Care Team: [name], NP as PCP - General (Internal Medicine) Dr. Leonides Schanz (Psychiatry) Dr. Kenton Kingfisher (Gynecology) Tawni Millers, MD (Internal Medicine) Curley Spice, Virl Diamond, RPH-CPP as Pharmacist (Pharmacist) Rebekah Chesterfield, LCSW  as Education officer, museum (Licensed Holiday representative)  Indicate any recent [employer] you may have received from other than [facility] providers in the past year (date may be approximate).     Assessment:    This is a routine wellness examination for [place].  Hearing/Vision screen Hearing Screening - Comments:: No aids Vision Screening - Comments:: Wears glasses- MD is [place]  Dietary issues and exercise activities discussed: Current Exercise Habits: Home exercise routine, Type of exercise: strength training/weights, Time (Minutes): 60, Frequency (Times/Week): 2, Weekly Exercise (Minutes/Week): 120, Intensity: Mild   Goals Addressed             This Visit's Progress    DIET - EAT MORE FRUITS AND VEGETABLES         Depression Screen    02/01/2022   11:51 AM 07/17/2021    3:03 PM 04/19/2021   11:22 AM 01/15/2021    2:27 PM 11/04/2019    2:15 PM 09/07/2019   10:43 AM 09/07/2019    9:13 AM  PHQ 2/9 Scores  PHQ - 2 Score 0 2 0 _0 0  PHQ- 9 Score 0 _1 Fall Risk    02/01/2022   11:54 AM 07/17/2021    3:03 PM 04/19/2021   11:23 AM 09/07/2019    9:09 AM 06/25/2018    8:22 AM  Fall Risk   Falls in the past year? 0 0 0 1 0  Number falls in past yr: 0 0 0 1   Injury with Fall? 0 0 0 0   Risk for fall due to : No Fall Risks No Fall Risks No Fall Risks History of fall(s)   Follow up Falls prevention discussed;Falls evaluation completed Falls evaluation completed Falls evaluation completed Education provided;Falls evaluation completed;Falls prevention discussed     FALL RISK PREVENTION PERTAINING TO THE HOME:  Any stairs in or around the home? No  If so, are there any without handrails? No  Home free of loose throw rugs in walkways, pet beds, electrical cords, etc? Yes  Adequate lighting in your home to reduce risk of falls? Yes   ASSISTIVE DEVICES UTILIZED TO PREVENT FALLS:  Life alert? No  Use of a cane, walker or w/c? No  Grab bars in the bathroom? Yes  Shower chair or bench in shower? Yes  Elevated toilet seat or a handicapped toilet? No   Cognitive Function:        02/01/2022   11:56 AM 09/07/2019    9:16 AM  6CIT Screen  What Year? 0 points 0 points  What  month? 0 points 0 points  What time? 0 points 0 points  Count back from 20 0 points 0 points  Months in reverse 0 points 4 points  Repeat phrase 0 points 4 points  Total Score 0 points 8 points    Immunizations Immunization History  Administered Date(s) Administered   Influenza Inj Mdck Quad Pf 02/07/2016   Influenza,inj,Quad PF,6+ Mos 01/04/2019, 04/19/2021, 11/27/2021   Influenza-Unspecified 02/18/2017   PFIZER Comirnaty(Gray Top)Covid-19 Tri-Sucrose Vaccine 11/10/2019    TDAP status: Due, Education has been provided regarding the importance of this vaccine. Advised may receive this vaccine at local pharmacy or Health Dept. Aware to provide a copy of the vaccination record if obtained from local pharmacy or Health Dept. Verbalized acceptance and understanding.  Flu Vaccine status: Up to date  Pneumococcal vaccine status: Declined,  Education has been provided regarding the importance of this  vaccine but patient still declined. Advised may receive this vaccine at local pharmacy or Health Dept. Aware to provide a copy of the vaccination record if obtained from local pharmacy or Health Dept. Verbalized acceptance and understanding.   Covid-19 vaccine status: Completed vaccines  Qualifies for Shingles Vaccine? Yes   Zostavax completed No   Shingrix Completed?: No.    Education has been provided regarding the importance of this vaccine. Patient has been advised to call insurance company to determine out of pocket expense if they have not yet received this vaccine. Advised may also receive vaccine at local pharmacy or Health Dept. Verbalized acceptance and understanding.  Screening Tests Health Maintenance  Topic Date Due   MAMMOGRAM  Never done   Hepatitis C Screening  Never done   Zoster Vaccines- Shingrix (1 of 2) Never done   OPHTHALMOLOGY EXAM  02/20/2021   COLONOSCOPY (Pts 45-23yr Insurance coverage will need to be confirmed)  05/06/2021   Lung Cancer Screening  01/15/2022    FOOT EXAM  01/16/2022   TETANUS/TDAP  02/28/2022 (Originally 07/08/1984)   COVID-19 Vaccine (2 - Pfizer risk series) 12/01/2022 (Originally 12/01/2019)   HEMOGLOBIN A1C  06/06/2022   Diabetic kidney evaluation - GFR measurement  09/04/2022   PAP SMEAR-Modifier  09/21/2022   Diabetic kidney evaluation - Urine ACR  12/07/2022   Medicare Annual Wellness (AWV)  02/02/2023   INFLUENZA VACCINE  Completed   HIV Screening  Completed   HPV VACCINES  Aged Out    Health Maintenance  Health Maintenance Due  Topic Date Due   MAMMOGRAM  Never done   Hepatitis C Screening  Never done   Zoster Vaccines- Shingrix (1 of 2) Never done   OPHTHALMOLOGY EXAM  02/20/2021   COLONOSCOPY (Pts 45-445yrInsurance coverage will need to be confirmed)  05/06/2021   Lung Cancer Screening  01/15/2022   FOOT EXAM  01/16/2022    Colorectal cancer screening: Type of screening: Colonoscopy. Completed 05/06/18. Repeat every 3 years-has appt coming up  Mammogram status: Ordered today. Pt provided with contact info and advised to call to schedule appt.   declined BDS  Lung Cancer Screening: (Low Dose CT Chest recommended if Age 54-80ears, 30 pack-year currently smoking OR have quit w/in 15years.) does qualify.   Lung Cancer Screening Referral: has a pulmonologist  Additional Screening:  Hepatitis C Screening: does qualify; Completed no  Vision Screening: Recommended annual ophthalmology exams for early detection of glaucoma and other disorders of the eye. Is the patient up to date with their annual eye exam?  Yes  Who is the provider or what is the name of the office in which the patient attends annual eye exams? [place] pt is not established with a provider, would they like to be referred to a provider to establish care? No .   Dental Screening: Recommended annual dental exams for proper oral hygiene  Community Resource Referral / Chronic Care Management: CRR required this visit?  No   CCM required this  visit?  No      Plan:     I have personally reviewed and noted the following in the patient's chart:   Medical and social history Use of alcohol, tobacco or illicit drugs  Current medications and supplements including opioid prescriptions. Patient is not currently taking opioid prescriptions. Functional ability and status Nutritional status Physical activity Advanced directives List of other physicians Hospitalizations, surgeries, and ER visits in previous 12 months Vitals Screenings to include cognitive, depression,  and falls Referrals and appointments  In addition, I have reviewed and discussed with patient certain preventive protocols, quality metrics, and best practice recommendations. A written personalized care plan for preventive services as well as general preventive health recommendations were provided to patient.     Dionisio David, LPN   10/31/4479   Nurse Notes: none

## 2022-02-01 NOTE — Patient Instructions (Signed)
Brandi Calderon , Thank you for taking time to come for your Medicare Wellness Visit. I appreciate your ongoing commitment to your health goals. Please review the following plan we discussed and let me know if I can assist you in the future.   Screening recommendations/referrals: Colonoscopy: 05/06/18 Mammogram: referral sent Bone Density: declined Recommended yearly ophthalmology/optometry visit for glaucoma screening and checkup Recommended yearly dental visit for hygiene and checkup  Vaccinations: Influenza vaccine: 11/27/21 Pneumococcal vaccine: n/d Tdap vaccine: n/d Shingles vaccine: n/d  Covid-19: 11/10/19  Advanced directives: no  Conditions/risks identified: none  Next appointment: Follow up in one year for your annual wellness visit. 02/07/23 @ 11:35 am by phone  Preventive Care 40-64 Years, Female Preventive care refers to lifestyle choices and visits with your health care provider that can promote health and wellness. What does preventive care include? A yearly physical exam. This is also called an annual well check. Dental exams once or twice a year. Routine eye exams. Ask your health care provider how often you should have your eyes checked. Personal lifestyle choices, including: Daily care of your teeth and gums. Regular physical activity. Eating a healthy diet. Avoiding tobacco and drug use. Limiting alcohol use. Practicing safe sex. Taking low-dose aspirin daily starting at age 83. Taking vitamin and mineral supplements as recommended by your health care provider. What happens during an annual well check? The services and screenings done by your health care provider during your annual well check will depend on your age, overall health, lifestyle risk factors, and family history of disease. Counseling  Your health care provider may ask you questions about your: Alcohol use. Tobacco use. Drug use. Emotional well-being. Home and relationship well-being. Sexual  activity. Eating habits. Work and work Statistician. Method of birth control. Menstrual cycle. Pregnancy history. Screening  You may have the following tests or measurements: Height, weight, and BMI. Blood pressure. Lipid and cholesterol levels. These may be checked every 5 years, or more frequently if you are over 57 years old. Skin check. Lung cancer screening. You may have this screening every year starting at age 71 if you have a 30-pack-year history of smoking and currently smoke or have quit within the past 15 years. Fecal occult blood test (FOBT) of the stool. You may have this test every year starting at age 22. Flexible sigmoidoscopy or colonoscopy. You may have a sigmoidoscopy every 5 years or a colonoscopy every 10 years starting at age 27. Hepatitis C blood test. Hepatitis B blood test. Sexually transmitted disease (STD) testing. Diabetes screening. This is done by checking your blood sugar (glucose) after you have not eaten for a while (fasting). You may have this done every 1-3 years. Mammogram. This may be done every 1-2 years. Talk to your health care provider about when you should start having regular mammograms. This may depend on whether you have a family history of breast cancer. BRCA-related cancer screening. This may be done if you have a family history of breast, ovarian, tubal, or peritoneal cancers. Pelvic exam and Pap test. This may be done every 3 years starting at age 52. Starting at age 91, this may be done every 5 years if you have a Pap test in combination with an HPV test. Bone density scan. This is done to screen for osteoporosis. You may have this scan if you are at high risk for osteoporosis. Discuss your test results, treatment options, and if necessary, the need for more tests with your health care provider. Vaccines  Your health care provider may recommend certain vaccines, such as: Influenza vaccine. This is recommended every year. Tetanus, diphtheria,  and acellular pertussis (Tdap, Td) vaccine. You may need a Td booster every 10 years. Zoster vaccine. You may need this after age 60. Pneumococcal 13-valent conjugate (PCV13) vaccine. You may need this if you have certain conditions and were not previously vaccinated. Pneumococcal polysaccharide (PPSV23) vaccine. You may need one or two doses if you smoke cigarettes or if you have certain conditions. Talk to your health care provider about which screenings and vaccines you need and how often you need them. This information is not intended to replace advice given to you by your health care provider. Make sure you discuss any questions you have with your health care provider. Document Released: 04/14/2015 Document Revised: 12/06/2015 Document Reviewed: 01/17/2015 Elsevier Interactive Patient Education  2017 Elsevier Inc.    Fall Prevention in the Home Falls can cause injuries. They can happen to people of all ages. There are many things you can do to make your home safe and to help prevent falls. What can I do on the outside of my home? Regularly fix the edges of walkways and driveways and fix any cracks. Remove anything that might make you trip as you walk through a door, such as a raised step or threshold. Trim any bushes or trees on the path to your home. Use bright outdoor lighting. Clear any walking paths of anything that might make someone trip, such as rocks or tools. Regularly check to see if handrails are loose or broken. Make sure that both sides of any steps have handrails. Any raised decks and porches should have guardrails on the edges. Have any leaves, snow, or ice cleared regularly. Use sand or salt on walking paths during winter. Clean up any spills in your garage right away. This includes oil or grease spills. What can I do in the bathroom? Use night lights. Install grab bars by the toilet and in the tub and shower. Do not use towel bars as grab bars. Use non-skid mats or  decals in the tub or shower. If you need to sit down in the shower, use a plastic, non-slip stool. Keep the floor dry. Clean up any water that spills on the floor as soon as it happens. Remove soap buildup in the tub or shower regularly. Attach bath mats securely with double-sided non-slip rug tape. Do not have throw rugs and other things on the floor that can make you trip. What can I do in the bedroom? Use night lights. Make sure that you have a light by your bed that is easy to reach. Do not use any sheets or blankets that are too big for your bed. They should not hang down onto the floor. Have a firm chair that has side arms. You can use this for support while you get dressed. Do not have throw rugs and other things on the floor that can make you trip. What can I do in the kitchen? Clean up any spills right away. Avoid walking on wet floors. Keep items that you use a lot in easy-to-reach places. If you need to reach something above you, use a strong step stool that has a grab bar. Keep electrical cords out of the way. Do not use floor polish or wax that makes floors slippery. If you must use wax, use non-skid floor wax. Do not have throw rugs and other things on the floor that can make you trip. What   can I do with my stairs? Do not leave any items on the stairs. Make sure that there are handrails on both sides of the stairs and use them. Fix handrails that are broken or loose. Make sure that handrails are as long as the stairways. Check any carpeting to make sure that it is firmly attached to the stairs. Fix any carpet that is loose or worn. Avoid having throw rugs at the top or bottom of the stairs. If you do have throw rugs, attach them to the floor with carpet tape. Make sure that you have a light switch at the top of the stairs and the bottom of the stairs. If you do not have them, ask someone to add them for you. What else can I do to help prevent falls? Wear shoes that: Do not  have high heels. Have rubber bottoms. Are comfortable and fit you well. Are closed at the toe. Do not wear sandals. If you use a stepladder: Make sure that it is fully opened. Do not climb a closed stepladder. Make sure that both sides of the stepladder are locked into place. Ask someone to hold it for you, if possible. Clearly mark and make sure that you can see: Any grab bars or handrails. First and last steps. Where the edge of each step is. Use tools that help you move around (mobility aids) if they are needed. These include: Canes. Walkers. Scooters. Crutches. Turn on the lights when you go into a dark area. Replace any light bulbs as soon as they burn out. Set up your furniture so you have a clear path. Avoid moving your furniture around. If any of your floors are uneven, fix them. If there are any pets around you, be aware of where they are. Review your medicines with your doctor. Some medicines can make you feel dizzy. This can increase your chance of falling. Ask your doctor what other things that you can do to help prevent falls. This information is not intended to replace advice given to you by your health care provider. Make sure you discuss any questions you have with your health care provider. Document Released: 01/12/2009 Document Revised: 08/24/2015 Document Reviewed: 04/22/2014 Elsevier Interactive Patient Education  2017 Elsevier Inc.  

## 2022-02-04 ENCOUNTER — Ambulatory Visit: Payer: Medicare HMO | Admitting: Pharmacist

## 2022-02-04 DIAGNOSIS — F17200 Nicotine dependence, unspecified, uncomplicated: Secondary | ICD-10-CM

## 2022-02-04 DIAGNOSIS — J449 Chronic obstructive pulmonary disease, unspecified: Secondary | ICD-10-CM

## 2022-02-04 DIAGNOSIS — E1169 Type 2 diabetes mellitus with other specified complication: Secondary | ICD-10-CM

## 2022-02-04 NOTE — Patient Instructions (Signed)
Goals Addressed             This Visit's Progress    Pharmacy Goals       Our goal A1c is less than 7%. This corresponds with fasting sugars less than 130 and 2 hour after meal sugars less than 180. Please check your blood sugar each morning  Our goal bad cholesterol, or LDL, is less than 70 . This is why it is important to continue taking your atorvastatin  Please follow up with the [place] [facility] for their help with quitting smoking.  The [place] [facility] phone number is: [phone]  Feel free to call me with any questions or concerns. I look forward to our next call!   Wallace Cullens, PharmD, [place] [phone]

## 2022-02-04 NOTE — Chronic Care Management (AMB) (Signed)
02/04/2022 Name: Brandi Calderon MRN: [id number] DOB: April 17, 1965  Chief Complaint  Patient presents with   Medication Adherence    Brandi Calderon is a 56 y.o. year old female who presented for a telephone visit.   They were referred to the pharmacist by their PCP for assistance in managing diabetes and medication adherence.   Subjective:  Care Team: Primary Care Provider: [name], NP ; Next Scheduled Visit: 01/08/2022 Pulmonologist: [facility] Endocrinologist: Dr. Gabriel Carina; Next Scheduled Visit: 04/04/2022  Medication Access/Adherence  [id number] [id number]: [phone]: [phone]  Patient reports affordability concerns with their medications: No  Patient reports access/transportation concerns to their pharmacy: No  Patient reports adherence concerns with their medications:  Yes  difficulty with keeping up with weekly pillbox  Patient again interested in using pill packaging to manage medications States will [place] to [place]  Di[facility]   Current medications:  Metformin 1000 mg twice daily Ozempic 1 mg weekly on Mondays Jardiance 25 mg daily glipizide ER 5 mg daily  Medications tried in the past: Trulicity ([place]  Recalls recent morning fasting blood sugar reading ranging 120-170   Reports following specific instructions received from Endocrinologist on 12/20/2021 for slowly trying to increase Ozempic dose up again   Statin: atorvastatin 40 mg daily   COPD:   Current medications: Trelegy - 1 puff daily Albuterol - 1-2 puffs every 6 hours as needed Overnight oxygen   Reports now using Trelegy consistently and  rinsing mouth after each use and using rescue (albuterol) inhaler as needed as directed   Tobacco Abuse:   From review of chart/patient reports working with L[employer] Energyon smoking cessation  Tobacco Use History: Reports currently smoking ~2 packs/day Reports family is her motivation for quitting. States her daughters have told her that they would quit smoking with her Reports planning to start quit with her daughters using nicotine patches and nicotine lozenges as prescribed by Pulmonology Triggers: boredom, others smoking around her, stress Denies having started nicotine patches or lozenges Again counsel on use of patches and lozenges   Health Maintenance  Health Maintenance Due  Topic Date Due   MAMMOGRAM  Never done   Hepatitis C Screening  Never done   Zoster Vaccines- Shingrix (1 of 2) Never done   OPHTHALMOLOGY EXAM  02/20/2021   COLONOSCOPY (Pts 45-410yrInsurance coverage will need to be confirmed)  05/06/2021   Lung Cancer Screening  01/15/2022   FOOT EXAM  01/16/2022     Objective: Lab Results  Component Value Date   HGBA1C 7.9 12/06/2021    Lab Results  Component Value Date   CREATININE 0.9 09/03/2021   BUN 14 09/11/2020   NA 139 09/11/2020   K 3.9 09/11/2020   CL 96 (L) 09/11/2020   CO2 33 (H) 09/11/2020    Lab Results  Component Value Date   CHOL 183 12/06/2021   HDL 36 12/06/2021   LDLCALC 36 12/06/2021   TRIG 161 (A) 12/06/2021   CHOLHDL 5.1 (H) 01/04/2019    Medications Reviewed Today     Reviewed by DeRennis PettyRPH-CPP (Pharmacist) on 02/04/22 at 1163Med List Status: <None>   Medication Order  Taking? Sig Documenting Provider Last Dose Status Informant  albuterol (VENTOLIN HFA) 108 (90 Base) MCG/ACT inhaler [id number] Yes INHALE 1-2 PUFFS BY MOUTH EVERY 6 HOURS AS NEEDED FOR WHEEZE OR SHORTNESS OF BREATH [name], [name], NP Taking Active   Aspirin-Salicylamide-Caffeine (BC HEADACHE POWDER PO) [id number] No Take 4  packets by mouth daily as needed (pain).  Patient not taking: Reported on 02/04/2022   [provider] Not Taking Active Self  atorvastatin (LIPITOR) 40 MG tablet [id number] Yes Take 40 mg by mouth daily. [provider] Taking Active   Blood Glucose Monitoring Suppl [facility] ULTRALINK) w/Device KIT [id number]  1 Device by Does not apply route daily. [name], NP  Active Self  buPROPion (WELLBUTRIN XL) 150 MG 24 hr tablet [id number] Yes Take 150 mg by mouth every morning. [provider] Taking Active   clonazePAM (KLONOPIN) 0.5 MG tablet [id number] Yes TAKE 1 TABLET BY MOUTH EVERY DAY AS NEEDED FOR ANXIETY [name], [name], NP Taking Active   fluticasone (FLONASE) 50 MCG/ACT nasal spray [id number] Yes SPRAY 2 SPRAYS INTO EACH NOSTRIL EVERY DAY [name], NP Taking Active   furosemide (LASIX) 40 MG tablet [id number] Yes TAKE 1 TABLET BY MOUTH EVERY DAY [name], [name], NP Taking Active   gabapentin (NEURONTIN) 300 MG capsule [id number] Yes Take 300 mg by mouth 3 (three) times daily as needed (pain). [provider] Taking Active Self  glipiZIDE (GLUCOTROL XL) 5 MG 24 hr tablet [id number] Yes Take 5 mg by mouth daily. [provider] Taking Active Self  glucose blood test strip [id number]  Use as instructed [name], NP  Active Self  JARDIANCE 25 MG TABS tablet [id number] Yes Take 25 mg by mouth daily. [provider] Taking Active Self  Lancets Glory [place] ULTRASOFT) lancets [id number]  Use as instructed [name], NP  Active Self  metFORMIN (GLUCOPHAGE) 1000 MG tablet [id number] Yes TAKE 1 TABLET (1,000 MG TOTAL) BY MOUTH 2 (TWO) TIMES DAILY WITH A MEAL. [name], NP Taking Active Self  nicotine (NICODERM CQ - DOSED IN MG/24 HOURS) 21 mg/24hr patch [id number]  Place 1 patch (21 mg total) onto the skin daily.  Patient not taking: Reported on 02/01/2022   Armando Reichert, MD  Active   nicotine polacrilex (NICOTINE MINI) 2 MG  lozenge [id number]  Take 1 lozenge (2 mg total) by mouth every 2 (two) hours as needed for smoking cessation.  Patient not taking: Reported on 02/01/2022   Armando Reichert, MD  Active   OXYGEN [id number]  Inhale 1 L into the lungs at bedtime. [provider]  Active Self  pantoprazole (PROTONIX) 40 MG tablet [id number] Yes TAKE 1 TABLET BY MOUTH EVERY DAY [name], [name], NP Taking Active   PARoxetine (PAXIL) 10 MG tablet [id number] Yes Take 1 tablet (10 mg total) by mouth daily. [name], NP Taking Active   Semaglutide, 2 MG/DOSE, 8 MG/3ML SOPN [id number] Yes Inject 1 mg into the skin once a week. [provider] Taking Active   TRELEGY ELLIPTA 100-62.5-25 MCG/ACT AEPB [id number] Yes Inhale 1 puff into the lungs daily. Olin Hauser, DO Taking Active Self  Med List Note [name], RN 06/25/18 6415): UDS 06-25-2018              Assessment/Plan:   Diabetes: - Currently uncontrolled based on latest A1C reading - Reviewed dietary modifications including  Counselrf patient on importance of having regular well-balanced meals, while controlling carbohydrate portion sizes  Again encourage patient to reduce intake of sugary beverages, to work on drinking water, rather than soda - Recommend to monitor home blood sugar as directed, keep log of results and have this to review at medical appointments and to call Endocrinologist sooner for reading outside of established parameters or symptoms    COPD: - Reviewed appropriate inhaler technique. - Recommend to continue using Trelegy inhaler consistently every day      Tobacco Abuse - Currently uncontrolled - Encourage patient to pick up nicotine lozenge Rx and start using nicotine replacement therapy as prescribed by Pulmonlogy - Have provided information on 1 800 QUIT NOW support program   Medication Management: - Patient interested in use of pill pack to organize medications - Patient to follow up with [place] to discuss having all of her prescriptions transferred from [place] and start pill packaging       Follow Up Plan: Clinic Pharmacist will follow up with patient again on 05/08/2022 at 11:15 AM    Wallace Cullens, PharmD, [place] [phone]

## 2022-02-13 ENCOUNTER — Other Ambulatory Visit: Payer: Self-pay | Admitting: Internal Medicine

## 2022-02-13 NOTE — Telephone Encounter (Signed)
Requested medication (s) are due for refill today: yes  Requested medication (s) are on the active medication list: yes  Last refill:  01/17/22 #30/0  Future visit scheduled: no  Notes to clinic:  Unable to refill per protocol, cannot delegate.    Requested Prescriptions  Pending Prescriptions Disp Refills   clonazePAM (KLONOPIN) 0.5 MG tablet [Pharmacy Med Name: CLONAZEPAM 0.5 MG TABLET] 30 tablet 0    Sig: TAKE 1 TABLET BY MOUTH EVERY DAY AS NEEDED FOR ANXIETY     Not Delegated - Psychiatry: Anxiolytics/Hypnotics 2 Failed - 02/13/2022 12:30 PM      Failed - This refill cannot be delegated      Failed - Urine Drug Screen completed in last 360 days      Passed - Patient is not pregnant      Passed - Valid encounter within last 6 months    Recent Outpatient Visits           1 week ago Type 2 diabetes mellitus with other specified complication, without long-term current use of insulin ([place])   [place], Grayland Ormond A, RPH-CPP   1 month ago Type 2 diabetes mellitus with other specified complication, without long-term current use of insulin ([place])   [facility] [place], Grayland Ormond A, RPH-CPP   2 months ago Menopausal symptoms   [facility] [place], [place] W, NP   7 months ago Mixed simple and mucopurulent chronic bronchitis [facility])   [facility] [place], [name], NP   10 months ago Need for immunization against influenza   [facility] [place], [name], NP

## 2022-02-19 ENCOUNTER — Other Ambulatory Visit: Payer: Self-pay | Admitting: Internal Medicine

## 2022-02-19 NOTE — Telephone Encounter (Signed)
Requested medication (s) are due for refill today: review  Requested medication (s) are on the active medication list: yes  Last refill:  01/17/22 #30/0  Future visit scheduled: no  Notes to clinic:  Unable to refill per protocol, cannot delegate.    Requested Prescriptions  Pending Prescriptions Disp Refills   clonazePAM (KLONOPIN) 0.5 MG tablet 30 tablet 0     Not Delegated - Psychiatry: Anxiolytics/Hypnotics 2 Failed - 02/19/2022 11:01 AM      Failed - This refill cannot be delegated      Failed - Urine Drug Screen completed in last 360 days      Passed - Patient is not pregnant      Passed - Valid encounter within last 6 months    Recent Outpatient Visits           2 weeks ago Type 2 diabetes mellitus with other specified complication, without long-term current use of insulin ([place])   [place], Grayland Ormond A, RPH-CPP   1 month ago Type 2 diabetes mellitus with other specified complication, without long-term current use of insulin ([place])   [facility], Grayland Ormond A, RPH-CPP   2 months ago Menopausal symptoms   [facility] [place] [place], [place] W, NP   7 months ago Mixed simple and mucopurulent chronic bronchitis [facility])   [facility] [name], NP   10 months ago Need for immunization against influenza   [facility], [name], NP

## 2022-02-19 NOTE — Telephone Encounter (Unsigned)
Copied from [place] [id number]. Topic: General - Other >> Feb 19, 2022  8:58 AM [place] C wrote: Reason for CRM: Medication Refill - Medication: clonazePAM (KLONOPIN) 0.5 MG tablet [[id number]]  Has the patient contacted their pharmacy? Yes.  The patient has been directed to contact their PCP  (Agent: If no, request that the patient contact the pharmacy for the refill. If patient does not wish to contact the pharmacy document the reason why and proceed with request.) (Agent: If yes, when and what did the pharmacy advise?)  Preferred Pharmacy (with phone number or street name): [id number] [address] [id number]: [phone]: [phone]: Not open 24 hours   Has the patient been seen for an appointment in the last year OR does the patient have an upcoming appointment? Yes.    Agent: Please be advised that RX refills may take up to 3 business days. We ask that you follow-up with your pharmacy.

## 2022-02-20 MED ORDER — CLONAZEPAM 0.5 MG PO TABS: 0.5000 mg | ORAL_TABLET | Freq: Every day | ORAL | 0 refills | Status: DC | PRN

## 2022-02-21 ENCOUNTER — Other Ambulatory Visit: Payer: Self-pay | Admitting: Family Medicine

## 2022-02-21 ENCOUNTER — Other Ambulatory Visit: Payer: Self-pay | Admitting: Internal Medicine

## 2022-02-21 DIAGNOSIS — R0602 Shortness of breath: Secondary | ICD-10-CM

## 2022-02-21 DIAGNOSIS — U071 COVID-19: Secondary | ICD-10-CM

## 2022-02-21 DIAGNOSIS — J449 Chronic obstructive pulmonary disease, unspecified: Secondary | ICD-10-CM

## 2022-02-22 DIAGNOSIS — J449 Chronic obstructive pulmonary disease, unspecified: Secondary | ICD-10-CM | POA: Diagnosis not present

## 2022-02-22 NOTE — Telephone Encounter (Signed)
Requested Prescriptions  Pending Prescriptions Disp Refills   albuterol (VENTOLIN HFA) 108 (90 Base) MCG/ACT inhaler [Pharmacy Med Name: ALBUTEROL HFA (PROVENTIL) INH] 6.7 each 3    Sig: INHALE 1-2 PUFFS BY MOUTH EVERY 6 HOURS AS NEEDED FOR WHEEZE OR SHORTNESS OF BREATH     Pulmonology:  Beta Agonists 2 Passed - 02/21/2022  1:25 PM      Passed - Last BP in normal range    BP Readings from Last 1 Encounters:  12/31/21 110/68         Passed - Last Heart Rate in normal range    Pulse Readings from Last 1 Encounters:  12/31/21 69         Passed - Valid encounter within last 12 months    Recent Outpatient Visits           2 weeks ago Type 2 diabetes mellitus with other specified complication, without long-term current use of insulin ([place])   [place], Grayland Ormond A, RPH-CPP   1 month ago Type 2 diabetes mellitus with other specified complication, without long-term current use of insulin ([place])   [facility] [place], Grayland Ormond A, RPH-CPP   2 months ago Menopausal symptoms   [facility] [place], [name], NP   7 months ago Mixed simple and mucopurulent chronic bronchitis ([place])   [facility] [place], [name], NP   10 months ago Need for immunization against influenza   [place], [name], NP               furosemide (LASIX) 40 MG tablet [Pharmacy Med Name: FUROSEMIDE 40 MG TABLET] 90 tablet 0    Sig: TAKE 1 TABLET BY MOUTH EVERY DAY     Cardiovascular:  Diuretics - Loop Failed - 02/21/2022  1:25 PM      Failed - K in normal range and within 180 days    Potassium  Date Value Ref Range Status  09/11/2020 3.9 3.5 - 5.1 mmol/L Final  12/07/2012 3.4 (L) 3.5 - 5.1 mmol/L Final         Failed - Ca in normal range and within 180 days    Calcium  Date Value Ref Range Status  09/11/2020 9.5 8.9 - 10.3 mg/dL Final   Calcium, Total  Date Value Ref Range Status  12/07/2012 8.6 8.5 - 10.1  mg/dL Final   Calcium, Ion  Date Value Ref Range Status  05/09/2010 1.11 (L) 1.12 - 1.32 mmol/L Final         Failed - Na in normal range and within 180 days    Sodium  Date Value Ref Range Status  09/11/2020 139 135 - 145 mmol/L Final  06/25/2017 139 134 - 144 mmol/L Final  12/07/2012 134 (L) 136 - 145 mmol/L Final         Failed - Cl in normal range and within 180 days    Chloride  Date Value Ref Range Status  09/11/2020 96 (L) 98 - 111 mmol/L Final  12/07/2012 103 98 - 107 mmol/L Final         Failed - Mg Level in normal range and within 180 days    Magnesium  Date Value Ref Range Status  09/05/2016 1.4 (L) 1.7 - 2.4 mg/dL Final         Passed - Cr in normal range and within 180 days    Creatinine  Date Value Ref Range Status  09/03/2021  0.9 0.5 - 1.1 Final   Creat  Date Value Ref Range Status  09/07/2019 0.81 0.50 - 1.05 mg/dL Final    Comment:    For patients >65 years of age, the reference limit for Creatinine is approximately 13% higher for people identified as African-American. .    Creatinine, Ser  Date Value Ref Range Status  09/11/2020 0.83 0.44 - 1.00 mg/dL Final         Passed - Last BP in normal range    BP Readings from Last 1 Encounters:  12/31/21 110/68         Passed - Valid encounter within last 6 months    Recent Outpatient Visits           2 weeks ago Type 2 diabetes mellitus with other specified complication, without long-term current use of insulin ([place])   [place], Grayland Ormond A, RPH-CPP   1 month ago Type 2 diabetes mellitus with other specified complication, without long-term current use of insulin ([place])   [place], Grayland Ormond A, RPH-CPP   2 months ago Menopausal symptoms   [facility] [place], [place] W, NP   7 months ago Mixed simple and mucopurulent chronic bronchitis [facility])   [facility] [place], [name], NP   10 months ago Need for immunization  against influenza   [facility] [place], [name], NP

## 2022-02-22 NOTE — Telephone Encounter (Signed)
Requested medication (s) are due for refill today: Yes  Requested medication (s) are on the active medication list: Yes  Last refill:  12/17/21  Future visit scheduled: Yes  Notes to clinic:  Unable to refill per protocol due to failed labs, no updated results.      Requested Prescriptions  Pending Prescriptions Disp Refills   furosemide (LASIX) 40 MG tablet [Pharmacy Med Name: FUROSEMIDE 40 MG TABLET] 90 tablet 0    Sig: TAKE 1 TABLET BY MOUTH EVERY DAY     Cardiovascular:  Diuretics - Loop Failed - 02/21/2022  1:25 PM      Failed - K in normal range and within 180 days    Potassium  Date Value Ref Range Status  09/11/2020 3.9 3.5 - 5.1 mmol/L Final  12/07/2012 3.4 (L) 3.5 - 5.1 mmol/L Final         Failed - Ca in normal range and within 180 days    Calcium  Date Value Ref Range Status  09/11/2020 9.5 8.9 - 10.3 mg/dL Final   Calcium, Total  Date Value Ref Range Status  12/07/2012 8.6 8.5 - 10.1 mg/dL Final   Calcium, Ion  Date Value Ref Range Status  05/09/2010 1.11 (L) 1.12 - 1.32 mmol/L Final         Failed - Na in normal range and within 180 days    Sodium  Date Value Ref Range Status  09/11/2020 139 135 - 145 mmol/L Final  06/25/2017 139 134 - 144 mmol/L Final  12/07/2012 134 (L) 136 - 145 mmol/L Final         Failed - Cl in normal range and within 180 days    Chloride  Date Value Ref Range Status  09/11/2020 96 (L) 98 - 111 mmol/L Final  12/07/2012 103 98 - 107 mmol/L Final         Failed - Mg Level in normal range and within 180 days    Magnesium  Date Value Ref Range Status  09/05/2016 1.4 (L) 1.7 - 2.4 mg/dL Final         Passed - Cr in normal range and within 180 days    Creatinine  Date Value Ref Range Status  09/03/2021 0.9 0.5 - 1.1 Final   Creat  Date Value Ref Range Status  09/07/2019 0.81 0.50 - 1.05 mg/dL Final    Comment:    For patients >11 years of age, the reference limit for Creatinine is approximately 13% higher for  people identified as African-American. .    Creatinine, Ser  Date Value Ref Range Status  09/11/2020 0.83 0.44 - 1.00 mg/dL Final         Passed - Last BP in normal range    BP Readings from Last 1 Encounters:  12/31/21 110/68         Passed - Valid encounter within last 6 months    Recent Outpatient Visits           2 weeks ago Type 2 diabetes mellitus with other specified complication, without long-term current use of insulin ([place])   [place], Grayland Ormond A, RPH-CPP   1 month ago Type 2 diabetes mellitus with other specified complication, without long-term current use of insulin ([place])   [place], Grayland Ormond A, RPH-CPP   2 months ago Menopausal symptoms   [facility] [place], [place] W, NP   7 months ago Mixed simple and mucopurulent chronic bronchitis ([place])  [facility] [place], [name], NP   10 months ago Need for immunization against influenza   [facility] [place], [name], NP              Signed Prescriptions Disp Refills   albuterol (VENTOLIN HFA) 108 (90 Base) MCG/ACT inhaler 6.7 each 3    Sig: INHALE 1-2 PUFFS BY MOUTH EVERY 6 HOURS AS NEEDED FOR WHEEZE OR SHORTNESS OF BREATH     Pulmonology:  Beta Agonists 2 Passed - 02/21/2022  1:25 PM      Passed - Last BP in normal range    BP Readings from Last 1 Encounters:  12/31/21 110/68         Passed - Last Heart Rate in normal range    Pulse Readings from Last 1 Encounters:  12/31/21 69         Passed - Valid encounter within last 12 months    Recent Outpatient Visits           2 weeks ago Type 2 diabetes mellitus with other specified complication, without long-term current use of insulin ([place])   [place], Grayland Ormond A, RPH-CPP   1 month ago Type 2 diabetes mellitus with other specified complication, without long-term current use of insulin ([place])   [place],  Grayland Ormond A, RPH-CPP   2 months ago Menopausal symptoms   [facility] [place], [place] W, NP   7 months ago Mixed simple and mucopurulent chronic bronchitis [facility])   [facility] [place], [name], NP   10 months ago Need for immunization against influenza   [facility] [place], [name], NP

## 2022-02-22 NOTE — Telephone Encounter (Signed)
Requested medication (s) are due for refill today - expired Rx  Requested medication (s) are on the active medication list -yes  Future visit scheduled -no  Last refill: 02/20/21 1 each 11 RF  Notes to clinic: medication not assigned protocol- provider review   Requested Prescriptions  Pending Prescriptions Disp [place] 100-62.5-25 MCG/ACT AEPB [Pharmacy Med Name: TRELEGY ELLIPTA 100-62.5-25] 60 each 11    Sig: TAKE 1 PUFF BY MOUTH EVERY DAY     Off-Protocol Failed - 02/21/2022  1:25 PM      Failed - Medication not assigned to a protocol, review manually.      Passed - Valid encounter within last 12 months    Recent Outpatient Visits           2 weeks ago Type 2 diabetes mellitus with other specified complication, without long-term current use of insulin ([place])   [place], Grayland Ormond A, RPH-CPP   1 month ago Type 2 diabetes mellitus with other specified complication, without long-term current use of insulin ([place])   [facility] [place], Grayland Ormond A, RPH-CPP   2 months ago Menopausal symptoms   [facility] [place], [place] W, NP   7 months ago Mixed simple and mucopurulent chronic bronchitis [facility])   [facility], [name], NP   10 months ago Need for immunization against influenza   [facility] [place], [name], NP                 Requested Prescriptions  Pending Prescriptions Disp Refills   [place] 100-62.5-25 MCG/ACT AEPB [Pharmacy Med Name: TRELEGY ELLIPTA 100-62.5-25] 60 each 11    Sig: TAKE 1 PUFF BY MOUTH EVERY DAY     Off-Protocol Failed - 02/21/2022  1:25 PM      Failed - Medication not assigned to a protocol, review manually.      Passed - Valid encounter within last 12 months    Recent Outpatient Visits           2 weeks ago Type 2 diabetes mellitus with other specified complication, without long-term current use of insulin ([place])   [place], Grayland Ormond A, RPH-CPP   1 month ago Type 2 diabetes mellitus with other specified complication, without long-term current use of insulin ([place])   [facility] [place], Grayland Ormond A, RPH-CPP   2 months ago Menopausal symptoms   [facility] [place], [place], NP   7 months ago Mixed simple and mucopurulent chronic bronchitis [facility])   [facility] [name], NP   10 months ago Need for immunization against influenza   [facility] [place], [name], NP

## 2022-02-28 ENCOUNTER — Telehealth: Payer: Self-pay

## 2022-02-28 NOTE — Telephone Encounter (Signed)
Rescheduled colon with patient for 03/12/23 at 10:15 am at [facility] with Dr. Tarri Glenn. Pt still has a copy of her instructions & prep, and had no further questions. She's aware to arrive by 8:45 am.

## 2022-03-04 ENCOUNTER — Encounter (HOSPITAL_COMMUNITY): Payer: Self-pay | Admitting: Gastroenterology

## 2022-03-05 ENCOUNTER — Other Ambulatory Visit: Payer: Self-pay | Admitting: Family Medicine

## 2022-03-05 DIAGNOSIS — J449 Chronic obstructive pulmonary disease, unspecified: Secondary | ICD-10-CM

## 2022-03-05 NOTE — Telephone Encounter (Signed)
Requested medication (s) are due for refill today:   Yes  Requested medication (s) are on the active medication list:   Yes  Future visit scheduled:   No.   Seen by [name] over last several months not Dr. Parks Ranger   Last ordered: 02/20/2021 1 each, 11 refills  Returned because no protocol assigned to this medication.     Requested Prescriptions  Pending Prescriptions Disp [place] 100-62.5-25 MCG/ACT AEPB [Pharmacy Med Name: TRELEGY ELLIPTA 100-62.5-25] 60 each 11    Sig: TAKE 1 PUFF BY MOUTH EVERY DAY     Off-Protocol Failed - 03/05/2022  9:59 AM      Failed - Medication not assigned to a protocol, review manually.      Passed - Valid encounter within last 12 months    Recent Outpatient Visits           4 weeks ago Type 2 diabetes mellitus with other specified complication, without long-term current use of insulin ([place])   [facility] [place], Grayland Ormond A, RPH-CPP   2 months ago Type 2 diabetes mellitus with other specified complication, without long-term current use of insulin ([place])   [place], Grayland Ormond A, RPH-CPP   3 months ago Menopausal symptoms   [facility] [place], [place], NP   7 months ago Mixed simple and mucopurulent chronic bronchitis [facility])   [facility] [name], NP   10 months ago Need for immunization against influenza   [facility] [place], [name], NP

## 2022-03-07 ENCOUNTER — Other Ambulatory Visit: Payer: Self-pay | Admitting: Internal Medicine

## 2022-03-07 DIAGNOSIS — J449 Chronic obstructive pulmonary disease, unspecified: Secondary | ICD-10-CM

## 2022-03-07 MED ORDER — TRELEGY ELLIPTA 100-62.5-25 MCG/ACT IN AEPB: 1.0000 | INHALATION_SPRAY | Freq: Every day | RESPIRATORY_TRACT | 2 refills | Status: DC

## 2022-03-07 NOTE — Telephone Encounter (Signed)
Copied from [place] [id number]. Topic: General - Other >> Mar 07, 2022 11:04 AM Everette C wrote: Reason for CRM: Medication Refill - Medication: TRELEGY ELLIPTA 100-62.5-25 MCG/ACT AEPB [[id number]]  Has the patient contacted their pharmacy? Yes.  The patient has been directed to contact their PCP  (Agent: If no, request that the patient contact the pharmacy for the refill. If patient does not wish to contact the pharmacy document the reason why and proceed with request.) (Agent: If yes, when and what did the pharmacy advise?)  Preferred Pharmacy (with phone number or street name): [id number] [id number]: [phone]: [phone]: Not open 24 hours   Has the patient been seen for an appointment in the last year OR does the patient have an upcoming appointment? Yes.    Agent: Please be advised that RX refills may take up to 3 business days. We ask that you follow-up with your pharmacy.

## 2022-03-07 NOTE — Telephone Encounter (Signed)
Requested medication (s) are due for refill today: yes  Requested medication (s) are on the active medication list: yes  Last refill:  02/20/21  Future visit scheduled: yes  Notes to clinic:  Unable to refill per protocol, medication was refused due to appointment needed. Patient has OV scheduled 03/14/22, routing for review.     Requested Prescriptions  Pending Prescriptions Disp Refills   TRELEGY ELLIPTA 100-62.5-25 MCG/ACT AEPB 1 each 11    Sig: Inhale 1 puff into the lungs daily.     Off-Protocol Failed - 03/07/2022 12:11 PM      Failed - Medication not assigned to a protocol, review manually.      Passed - Valid encounter within last 12 months    Recent Outpatient Visits           1 month ago Type 2 diabetes mellitus with other specified complication, without long-term current use of insulin ([place])   [facility] [place], Grayland Ormond A, RPH-CPP   2 months ago Type 2 diabetes mellitus with other specified complication, without long-term current use of insulin ([place])   [place], Grayland Ormond A, RPH-CPP   3 months ago Menopausal symptoms   [facility] [place], [place], NP   7 months ago Mixed simple and mucopurulent chronic bronchitis [facility])   [facility] [place] [name], NP   10 months ago Need for immunization against influenza   [facility] [place], [name], NP       Future Appointments             In 1 week Garnette [place]ner, [name], NP [facility]

## 2022-03-08 ENCOUNTER — Encounter (HOSPITAL_COMMUNITY): Payer: Self-pay | Admitting: Gastroenterology

## 2022-03-08 ENCOUNTER — Encounter (HOSPITAL_COMMUNITY): Payer: Self-pay | Admitting: Anesthesiology

## 2022-03-11 ENCOUNTER — Ambulatory Visit (HOSPITAL_COMMUNITY): Admission: RE | Admit: 2022-03-11 | Payer: Medicare HMO | Source: Home / Self Care | Admitting: Gastroenterology

## 2022-03-11 ENCOUNTER — Encounter (HOSPITAL_COMMUNITY): Admission: RE | Payer: Self-pay | Source: Home / Self Care

## 2022-03-11 SURGERY — COLONOSCOPY WITH PROPOFOL
Anesthesia: Monitor Anesthesia Care

## 2022-03-11 NOTE — Anesthesia Preprocedure Evaluation (Deleted)
Anesthesia Evaluation  Patient identified by MRN, date of birth, ID band Patient awake    Reviewed: Allergy & Precautions, NPO status , Patient's Chart, lab work & pertinent test results  Airway        Dental   Pulmonary asthma , sleep apnea , COPD, Current Smoker          Cardiovascular hypertension,   HLD  TTE 09/23/2019: IMPRESSIONS     1. Left ventricular ejection fraction, by estimation, is 55 to 60%. The  left ventricle has normal function. The left ventricle has no regional  wall motion abnormalities. Left ventricular diastolic parameters were  normal.   2. Right ventricular systolic function is normal. The right ventricular  size is normal. Tricuspid regurgitation signal is inadequate for assessing  PA pressure.   3. The mitral valve is normal in structure. No evidence of mitral valve  regurgitation. No evidence of mitral stenosis.   4. The aortic valve is normal in structure. Aortic valve regurgitation is  not visualized. No aortic stenosis is present.   5. The inferior vena cava is normal in size with greater than 50%  respiratory variability, suggesting right atrial pressure of 3 mmHg.     Neuro/Psych  Headaches PSYCHIATRIC DISORDERS Anxiety Depression    Chronic pain syndrome  Neuromuscular disease (neuropathy)    GI/Hepatic Bowel prep,GERD  Medicated,,  Endo/Other  diabetes (on Trulicity), Type 2, Oral Hypoglycemic Agents    Renal/GU      Musculoskeletal  (+) Arthritis ,    Abdominal   Peds  Hematology   Anesthesia Other Findings Last Trulicity:   Reproductive/Obstetrics                              Anesthesia Physical Anesthesia Plan Anesthesia Quick Evaluation

## 2022-03-14 ENCOUNTER — Ambulatory Visit (INDEPENDENT_AMBULATORY_CARE_PROVIDER_SITE_OTHER): Payer: Medicare HMO | Admitting: Internal Medicine

## 2022-03-14 ENCOUNTER — Encounter: Payer: Self-pay | Admitting: Internal Medicine

## 2022-03-14 VITALS — BP 112/74 | HR 73 | Temp 96.9°F | Ht 61.0 in | Wt 135.0 lb

## 2022-03-14 DIAGNOSIS — Z6825 Body mass index (BMI) 25.0-25.9, adult: Secondary | ICD-10-CM

## 2022-03-14 DIAGNOSIS — Z1211 Encounter for screening for malignant neoplasm of colon: Secondary | ICD-10-CM

## 2022-03-14 DIAGNOSIS — Z1159 Encounter for screening for other viral diseases: Secondary | ICD-10-CM

## 2022-03-14 DIAGNOSIS — E663 Overweight: Secondary | ICD-10-CM | POA: Insufficient documentation

## 2022-03-14 DIAGNOSIS — Z1231 Encounter for screening mammogram for malignant neoplasm of breast: Secondary | ICD-10-CM

## 2022-03-14 DIAGNOSIS — Z23 Encounter for immunization: Secondary | ICD-10-CM

## 2022-03-14 DIAGNOSIS — E1165 Type 2 diabetes mellitus with hyperglycemia: Secondary | ICD-10-CM

## 2022-03-14 DIAGNOSIS — Z0001 Encounter for general adult medical examination with abnormal findings: Secondary | ICD-10-CM | POA: Diagnosis not present

## 2022-03-14 DIAGNOSIS — F17200 Nicotine dependence, unspecified, uncomplicated: Secondary | ICD-10-CM | POA: Diagnosis not present

## 2022-03-14 DIAGNOSIS — R69 Illness, unspecified: Secondary | ICD-10-CM | POA: Diagnosis not present

## 2022-03-14 NOTE — Progress Notes (Signed)
Subjective:    Patient ID: Brandi Calderon, female    DOB: 07-01-1965, 56 y.o.   MRN: [id number]  HPI  Patient presents to clinic today for annual exam.  Flu: 10/2021 Tetanus: > 10 years ago COVID: Pfizer x 1 Pneumovax: Never Shingrix: Never Pap smear: 08/2019 Mammogram: >2 years ago Colon screening: 05/2018 Vision screening: annually Dentist: dentures  Diet: She does eat meat. She consumes some fruits and veggies. She tries to avoid fried foods. She drinks mostly soda, coffee Exercise: None  Review of Systems     Past Medical History:  Diagnosis Date   Anxiety    Arthritis    Asthma    Back pain    COPD (chronic obstructive pulmonary disease) (HCC)    Depression    Diabetes mellitus    Headache    History of COVID-19    Hyperlipidemia    Hypertension    Neuromuscular disorder (HCC)    Sleep apnea    Tendonitis     Current Outpatient Medications  Medication Sig Dispense Refill   albuterol (VENTOLIN HFA) 108 (90 Base) MCG/ACT inhaler INHALE 1-2 PUFFS BY MOUTH EVERY 6 HOURS AS NEEDED FOR WHEEZE OR SHORTNESS OF BREATH 6.7 each 3   Aspirin-Salicylamide-Caffeine (BC HEADACHE POWDER PO) Take 4 packets by mouth daily as needed (pain).     atorvastatin (LIPITOR) 40 MG tablet Take 40 mg by mouth daily.     Blood Glucose Monitoring Suppl (ONETOUCH ULTRALINK) w/Device KIT 1 Device by Does not apply route daily. 1 kit 0   buPROPion (WELLBUTRIN XL) 150 MG 24 hr tablet Take 150 mg by mouth every morning. (Patient not taking: Reported on 03/07/2022)     clonazePAM (KLONOPIN) 0.5 MG tablet Take 1 tablet (0.5 mg total) by mouth daily as needed for anxiety. (Patient taking differently: Take 0.5 mg by mouth daily.) 30 tablet 0   fluticasone (FLONASE) 50 MCG/ACT nasal spray SPRAY 2 SPRAYS INTO EACH NOSTRIL EVERY DAY 48 mL 1   furosemide (LASIX) 40 MG tablet TAKE 1 TABLET BY MOUTH EVERY DAY 90 tablet 0   gabapentin (NEURONTIN) 300 MG capsule Take 300 mg by mouth at bedtime.      glipiZIDE (GLUCOTROL XL) 5 MG 24 hr tablet Take 5 mg by mouth daily.     glucose blood test strip Use as instructed 100 each 0   JARDIANCE 25 MG TABS tablet Take 25 mg by mouth daily.     Lancets (ONETOUCH ULTRASOFT) lancets Use as instructed 100 each 12   metFORMIN (GLUCOPHAGE) 1000 MG tablet TAKE 1 TABLET (1,000 MG TOTAL) BY MOUTH 2 (TWO) TIMES DAILY WITH A MEAL. 60 tablet 0   nicotine polacrilex (NICOTINE MINI) 2 MG lozenge Take 1 lozenge (2 mg total) by mouth every 2 (two) hours as needed for smoking cessation. (Patient not taking: Reported on 02/01/2022) 72 lozenge 3   OXYGEN Inhale 1 L into the lungs at bedtime.     pantoprazole (PROTONIX) 40 MG tablet TAKE 1 TABLET BY MOUTH EVERY DAY (Patient taking differently: Take 40 mg by mouth daily as needed (Heartburn).) 90 tablet 1   PARoxetine (PAXIL) 10 MG tablet Take 1 tablet (10 mg total) by mouth daily. 90 tablet 1   TRELEGY ELLIPTA 100-62.5-25 MCG/ACT AEPB Inhale 1 puff into the lungs daily. 1 each 2   TRULICITY 4.5 DU/2.0UR SOPN Inject 4.5 mg into the skin once a week.     No current facility-administered medications for this visit.    Allergies  Allergen Reactions   Penicillins Anaphylaxis    Has patient had a PCN reaction causing immediate rash, facial/tongue/throat swelling, SOB or lightheadedness with hypotension: Yes Has patient had a PCN reaction causing severe rash involving mucus membranes or skin necrosis: No Has patient had a PCN reaction that required hospitalization: No Has patient had a PCN reaction occurring within the last 10 years: No If all of the above answers are "NO", then may proceed with Cephalosporin use.   Throat swells   Victoza [Liraglutide]     Severe nausea   Vicodin [Hydrocodone-Acetaminophen] Rash    And hives    Family History  Problem Relation Age of Onset   Diabetes Mother    Kidney disease Father    Diabetes Father    Dementia Father    Diabetes Sister    Diabetes Brother    Diabetes  Brother    Colon cancer Neg Hx    Esophageal cancer Neg Hx    Stomach cancer Neg Hx    Rectal cancer Neg Hx     Social History   Socioeconomic History   Marital status: Divorced    Spouse name: Not on file   Number of children: Not on file   Years of education: Not on file   Highest education level: Not on file  Occupational History   Not on file  Tobacco Use   Smoking status: Every Day    Packs/day: 1.50    Years: 39.00    Total pack years: 58.50    Types: Cigarettes   Smokeless tobacco: Never   Tobacco comments:    2PPD 10/10/2020  Vaping Use   Vaping Use: Never used  Substance and Sexual Activity   Alcohol use: No   Drug use: Yes    Types: Marijuana    Comment: States she smokes MJ when she does not have any Tramadol.   Sexual activity: Not on file  Other Topics Concern   Not on file  Social History Narrative   ** Merged History Encounter ** In the process of getting SSD for depression, DM and neuropathy      Active smoker [~ 2p/day; cutting down]; no alcohol; lives in [place]; self. Used to work in [employer]    Social Determinants of Health   Financial Resource Strain: Low Risk  (02/01/2022)   Overall Financial Resource Strain (CARDIA)    Difficulty of Paying Living Expenses: Not very hard  Food Insecurity: No Food Insecurity (02/01/2022)   Hunger Vital Sign    Worried About Running Out of Food in the Last Year: Never true    Ran Out of Food in the Last Year: Never true  Transportation Needs: No Transportation Needs (02/01/2022)   PRAPARE - Hydrologist (Medical): No    Lack of Transportation (Non-Medical): No  Physical Activity: Insufficiently Active (02/01/2022)   Exercise Vital Sign    Days of Exercise per Week: 2 days    Minutes of Exercise per Session: 60 min  Stress: No Stress Concern Present (02/01/2022)   [place]    Feeling of Stress : Only a little   Social Connections: Socially Isolated (02/01/2022)   Social Connection and Isolation Panel [NHANES]    Frequency of Communication with Friends and Family: More than three times a week    Frequency of Social Gatherings with Friends and Family: More than three times a week    Attends Religious Services: Never  Active Member of Clubs or Organizations: No    Attends Archivist Meetings: Never    Marital Status: Divorced  Human resources officer Violence: Not At Risk (02/01/2022)   Humiliation, Afraid, Rape, and Kick questionnaire    Fear of Current or Ex-Partner: No    Emotionally Abused: No    Physically Abused: No    Sexually Abused: No     Constitutional: Denies fever, malaise, fatigue, headache or abrupt weight changes.  HEENT: Denies eye pain, eye redness, ear pain, ringing in the ears, wax buildup, runny nose, nasal congestion, bloody nose, or sore throat. Respiratory: Denies difficulty breathing, shortness of breath, cough or sputum production.   Cardiovascular: Denies chest pain, chest tightness, palpitations or swelling in the hands or feet.  Gastrointestinal: Denies abdominal pain, bloating, constipation, diarrhea or blood in the stool.  GU: Denies urgency, frequency, pain with urination, burning sensation, blood in urine, odor or discharge. Musculoskeletal: Patient reports chronic joint and muscle pain.  Denies decrease in range of motion, difficulty with gait, or joint swelling.  Skin: Denies redness, rashes, lesions or ulcercations.  Neurological: Patient reports neuropathy.  Denies dizziness, difficulty with memory, difficulty with speech or problems with balance and coordination.  Psych: Patient has a history of anxiety and depression.  Denies SI/HI.  No other specific complaints in a complete review of systems (except as listed in HPI above).  Objective:   Physical Exam  BP 112/74 (BP Location: Left Arm, Patient Position: Sitting, Cuff Size: Normal)   Pulse 73    Temp (!) 96.9 F (36.1 C) (Temporal)   Ht _0  (1.549 m)   Wt 135 lb (61.2 kg)   SpO2 98%   BMI 25.51 kg/m   Wt Readings from Last 3 Encounters:  02/01/22 132 lb (59.9 kg)  12/31/21 132 lb 3.2 oz (60 kg)  11/28/21 130 lb (59 kg)    General: Appears her stated age, overweight, in NAD. Skin: Warm, dry and intact. No ulcerations noted. HEENT: Head: normal shape and size; Eyes: sclera white, no icterus, conjunctiva pink, PERRLA and EOMs intact;  Neck:  Neck supple, trachea midline. No masses, lumps or thyromegaly present.  Cardiovascular: Normal rate and rhythm. S1,S2 noted.  No murmur, rubs or gallops noted. No JVD or BLE edema. No carotid bruits noted. Pulmonary/Chest: Normal effort and positive vesicular breath sounds. No respiratory distress. No wheezes, rales or ronchi noted.  Abdomen: Normal bowel sounds.  Musculoskeletal: Strength 5/5 BUE/BLE. No difficulty with gait.  Neurological: Alert and oriented. Cranial nerves II-XII grossly intact. Coordination normal.  Psychiatric: Mood and affect normal. Behavior is normal. Judgment and thought content normal.     BMET    Component Value Date/Time   NA 139 09/11/2020 1448   NA 139 06/25/2017 1027   NA 134 (L) 12/07/2012 1809   K 3.9 09/11/2020 1448   K 3.4 (L) 12/07/2012 1809   CL 96 (L) 09/11/2020 1448   CL 103 12/07/2012 1809   CO2 33 (H) 09/11/2020 1448   CO2 25 12/07/2012 1809   GLUCOSE 124 (H) 09/11/2020 1448   GLUCOSE 274 (H) 12/07/2012 1809   BUN 14 09/11/2020 1448   BUN 10 06/25/2017 1027   BUN 10 12/07/2012 1809   CREATININE 0.9 09/03/2021 0000   CREATININE 0.83 09/11/2020 1448   CREATININE 0.81 09/07/2019 1019   CALCIUM 9.5 09/11/2020 1448   CALCIUM 8.6 12/07/2012 1809   GFRNONAA >60 09/11/2020 1448   GFRNONAA 84 09/07/2019 1019   GFRAA 97 09/07/2019  1019    Lipid Panel     Component Value Date/Time   CHOL 183 12/06/2021 0000   CHOL 120 12/24/2016 1827   TRIG 161 (A) 12/06/2021 0000   HDL 36  12/06/2021 0000   HDL 29 (L) 12/24/2016 1827   CHOLHDL 5.1 (H) 01/04/2019 0929   LDLCALC 36 12/06/2021 0000   LDLCALC 146 (H) 01/04/2019 0929    CBC    Component Value Date/Time   WBC 10.4 09/11/2020 1448   RBC 4.64 09/11/2020 1448   HGB 13.5 09/11/2020 1448   HGB 12.8 06/25/2017 1027   HCT 40.4 09/11/2020 1448   HCT 38.3 06/25/2017 1027   PLT 285 09/11/2020 1448   PLT 348 06/25/2017 1027   MCV 87.1 09/11/2020 1448   MCV 90 06/25/2017 1027   MCV 80 12/07/2012 1809   MCH 29.1 09/11/2020 1448   MCHC 33.4 09/11/2020 1448   RDW 15.1 09/11/2020 1448   RDW 15.0 06/25/2017 1027   RDW 17.8 (H) 12/07/2012 1809   LYMPHSABS 3.3 09/11/2020 1448   LYMPHSABS 3.3 (H) 09/11/2016 1131   MONOABS 0.5 09/11/2020 1448   EOSABS 0.3 09/11/2020 1448   EOSABS 0.2 09/11/2016 1131   BASOSABS 0.1 09/11/2020 1448   BASOSABS 0.0 09/11/2016 1131    Hgb A1C Lab Results  Component Value Date   HGBA1C 7.9 12/06/2021            Assessment & Plan:   Preventative Health Maintenance:  Flu shot UTD She declines tetanus for financial reasons, advised if she gets bit or cut to go get this done Encouraged her to get a COVID booster Pneumovax today Discussed Shingrix vaccine, she will check coverage with her insurance company and schedule a nurse visit if she would like to have this done Pap smear UTD  Mammogram ordered-she will call to schedule Referral to GI for colon cancer screening CT lung cancer screening ordered Encouraged her to consume a balanced diet and exercise regimen Advised her to see an eye doctor and dentist annually We will check CBC, c-Met, lipid, A1c and hep C today  RTC in 6 months, follow-up chronic conditions [name], NP

## 2022-03-14 NOTE — Assessment & Plan Note (Signed)
Encouraged diet and exercise for weight loss ?

## 2022-03-14 NOTE — Patient Instructions (Signed)
Health Maintenance for Postmenopausal Women Menopause is a normal process in which your ability to get pregnant comes to an end. This process happens slowly over many months or years, usually between the ages of 48 and 55. Menopause is complete when you have missed your menstrual period for 12 months. It is important to talk with your health care provider about some of the most common conditions that affect women after menopause (postmenopausal women). These include heart disease, cancer, and bone loss (osteoporosis). Adopting a healthy lifestyle and getting preventive care can help to promote your health and wellness. The actions you take can also lower your chances of developing some of these common conditions. What are the signs and symptoms of menopause? During menopause, you may have the following symptoms: Hot flashes. These can be moderate or severe. Night sweats. Decrease in sex drive. Mood swings. Headaches. Tiredness (fatigue). Irritability. Memory problems. Problems falling asleep or staying asleep. Talk with your health care provider about treatment options for your symptoms. Do I need hormone replacement therapy? Hormone replacement therapy is effective in treating symptoms that are caused by menopause, such as hot flashes and night sweats. Hormone replacement carries certain risks, especially as you become older. If you are thinking about using estrogen or estrogen with progestin, discuss the benefits and risks with your health care provider. How can I reduce my risk for heart disease and stroke? The risk of heart disease, heart attack, and stroke increases as you age. One of the causes may be a change in the body's hormones during menopause. This can affect how your body uses dietary fats, triglycerides, and cholesterol. Heart attack and stroke are medical emergencies. There are many things that you can do to help prevent heart disease and stroke. Watch your blood pressure High  blood pressure causes heart disease and increases the risk of stroke. This is more likely to develop in people who have high blood pressure readings or are overweight. Have your blood pressure checked: Every 3-5 years if you are 18-39 years of age. Every year if you are 40 years old or older. Eat a healthy diet  Eat a diet that includes plenty of vegetables, fruits, low-fat dairy products, and lean protein. Do not eat a lot of foods that are high in solid fats, added sugars, or sodium. Get regular exercise Get regular exercise. This is one of the most important things you can do for your health. Most adults should: Try to exercise for at least 150 minutes each week. The exercise should increase your heart rate and make you sweat (moderate-intensity exercise). Try to do strengthening exercises at least twice each week. Do these in addition to the moderate-intensity exercise. Spend less time sitting. Even light physical activity can be beneficial. Other tips Work with your health care provider to achieve or maintain a healthy weight. Do not use any products that contain nicotine or tobacco. These products include cigarettes, chewing tobacco, and vaping devices, such as e-cigarettes. If you need help quitting, ask your health care provider. Know your numbers. Ask your health care provider to check your cholesterol and your blood sugar (glucose). Continue to have your blood tested as directed by your health care provider. Do I need screening for cancer? Depending on your health history and family history, you may need to have cancer screenings at different stages of your life. This may include screening for: Breast cancer. Cervical cancer. Lung cancer. Colorectal cancer. What is my risk for osteoporosis? After menopause, you may be   at increased risk for osteoporosis. Osteoporosis is a condition in which bone destruction happens more quickly than new bone creation. To help prevent osteoporosis or  the bone fractures that can happen because of osteoporosis, you may take the following actions: If you are 19-50 years old, get at least 1,000 mg of calcium and at least 600 international units (IU) of vitamin D per day. If you are older than age 50 but younger than age 70, get at least 1,200 mg of calcium and at least 600 international units (IU) of vitamin D per day. If you are older than age 70, get at least 1,200 mg of calcium and at least 800 international units (IU) of vitamin D per day. Smoking and drinking excessive alcohol increase the risk of osteoporosis. Eat foods that are rich in calcium and vitamin D, and do weight-bearing exercises several times each week as directed by your health care provider. How does menopause affect my mental health? Depression may occur at any age, but it is more common as you become older. Common symptoms of depression include: Feeling depressed. Changes in sleep patterns. Changes in appetite or eating patterns. Feeling an overall lack of motivation or enjoyment of activities that you previously enjoyed. Frequent crying spells. Talk with your health care provider if you think that you are experiencing any of these symptoms. General instructions See your health care provider for regular wellness exams and vaccines. This may include: Scheduling regular health, dental, and eye exams. Getting and maintaining your vaccines. These include: Influenza vaccine. Get this vaccine each year before the flu season begins. Pneumonia vaccine. Shingles vaccine. Tetanus, diphtheria, and pertussis (Tdap) booster vaccine. Your health care provider may also recommend other immunizations. Tell your health care provider if you have ever been abused or do not feel safe at home. Summary Menopause is a normal process in which your ability to get pregnant comes to an end. This condition causes hot flashes, night sweats, decreased interest in sex, mood swings, headaches, or lack  of sleep. Treatment for this condition may include hormone replacement therapy. Take actions to keep yourself healthy, including exercising regularly, eating a healthy diet, watching your weight, and checking your blood pressure and blood sugar levels. Get screened for cancer and depression. Make sure that you are up to date with all your vaccines. This information is not intended to replace advice given to you by your health care provider. Make sure you discuss any questions you have with your health care provider. Document Revised: 08/07/2020 Document Reviewed: 08/07/2020 Elsevier Patient Education  2023 Elsevier Inc.  

## 2022-03-15 ENCOUNTER — Telehealth: Payer: Self-pay

## 2022-03-15 ENCOUNTER — Other Ambulatory Visit: Payer: Self-pay

## 2022-03-15 LAB — LIPID PANEL
Cholesterol: 170 mg/dL (ref <200)
HDL: 44 mg/dL — ABNORMAL LOW (ref >=50)
LDL Cholesterol (Calc): 103 mg/dL (calc) — ABNORMAL HIGH
Non-HDL Cholesterol (Calc): 126 mg/dL (calc) (ref <130)
Total CHOL/HDL Ratio: 3.9 (calc) (ref <5.0)
Triglycerides: 132 mg/dL (ref <150)

## 2022-03-15 LAB — COMPLETE METABOLIC PANEL WITH GFR
AG Ratio: 1.6 (calc) (ref 1.0–2.5)
ALT: 8 U/L (ref 6–29)
AST: 10 U/L (ref 10–35)
Albumin: 4.4 g/dL (ref 3.6–5.1)
Alkaline phosphatase (APISO): 113 U/L (ref 37–153)
BUN: 14 mg/dL (ref 7–25)
CO2: 30 mmol/L (ref 20–32)
Calcium: 9.9 mg/dL (ref 8.6–10.4)
Chloride: 103 mmol/L (ref 98–110)
Creat: 0.71 mg/dL (ref 0.50–1.03)
Globulin: 2.8 g/dL (calc) (ref 1.9–3.7)
Glucose, Bld: 114 mg/dL — ABNORMAL HIGH (ref 65–99)
Potassium: 4.9 mmol/L (ref 3.5–5.3)
Sodium: 141 mmol/L (ref 135–146)
Total Bilirubin: 0.3 mg/dL (ref 0.2–1.2)
Total Protein: 7.2 g/dL (ref 6.1–8.1)
eGFR: 101 mL/min/{1.73_m2} (ref >=60)

## 2022-03-15 LAB — CBC
HCT: 42.9 % (ref 35.0–45.0)
Hemoglobin: 14.4 g/dL (ref 11.7–15.5)
MCH: 30.2 pg (ref 27.0–33.0)
MCHC: 33.6 g/dL (ref 32.0–36.0)
MCV: 89.9 fL (ref 80.0–100.0)
MPV: 9.7 fL (ref 7.5–12.5)
Platelets: 294 10*3/uL (ref 140–400)
RBC: 4.77 10*6/uL (ref 3.80–5.10)
RDW: 13.2 % (ref 11.0–15.0)
WBC: 10.5 10*3/uL (ref 3.8–10.8)

## 2022-03-15 LAB — HEMOGLOBIN A1C
Hgb A1c MFr Bld: 7.1 % of total Hgb — ABNORMAL HIGH (ref <5.7)
Mean Plasma Glucose: 157 mg/dL
eAG (mmol/L): 8.7 mmol/L

## 2022-03-15 LAB — HEPATITIS C ANTIBODY: Hepatitis C Ab: NONREACTIVE

## 2022-03-15 MED ORDER — PEG 3350-KCL-NA BICARB-NACL 420 G PO SOLR: 4000.0000 mL | Freq: Once | ORAL | 0 refills | Status: AC

## 2022-03-15 NOTE — Addendum Note (Signed)
Addended by: Ashley Royalty E on: 03/15/2022 11:32 AM   Modules accepted: Orders

## 2022-03-15 NOTE — Telephone Encounter (Signed)
Rescheduled hospital colon with patient for 05/30/22 at 9:15 AM at [facility] with Dr. Tarri Glenn. Instructions sent to [facility] & mailed home. Pt stated she would need new prep. Prep sent to pharmacy. Advised she call back with any questions.

## 2022-03-18 ENCOUNTER — Other Ambulatory Visit: Payer: Self-pay

## 2022-03-18 ENCOUNTER — Telehealth: Payer: Self-pay

## 2022-03-18 DIAGNOSIS — Z86010 Personal history of colonic polyps: Secondary | ICD-10-CM

## 2022-03-18 NOTE — Telephone Encounter (Signed)
   Patient decided to keep appt scheduled in Feb with 05/30/22 with Dr. Tarri Glenn.    T

## 2022-03-19 ENCOUNTER — Other Ambulatory Visit: Payer: Self-pay | Admitting: Internal Medicine

## 2022-03-19 NOTE — Telephone Encounter (Signed)
Requested medication (s) are due for refill today - yes  Requested medication (s) are on the active medication list -yes  Future visit scheduled -yes  Last refill: 02/20/22 #30  Notes to clinic: non delegated Rx  Requested Prescriptions  Pending Prescriptions Disp Refills   clonazePAM (KLONOPIN) 0.5 MG tablet 30 tablet 0    Sig: Take 1 tablet (0.5 mg total) by mouth daily as needed for anxiety.     Not Delegated - Psychiatry: Anxiolytics/Hypnotics 2 Failed - 03/19/2022  3:44 PM      Failed - This refill cannot be delegated      Failed - Urine Drug Screen completed in last 360 days      Passed - Patient is not pregnant      Passed - Valid encounter within last 6 months    Recent Outpatient Visits           5 days ago Encounter for general adult medical examination with abnormal findings   [facility] [place], [name], NP   1 month ago Type 2 diabetes mellitus with other specified complication, without long-term current use of insulin ([place])   [facility] [place], Grayland Ormond A, RPH-CPP   2 months ago Type 2 diabetes mellitus with other specified complication, without long-term current use of insulin ([place])   [place], Grayland Ormond A, RPH-CPP   3 months ago Menopausal symptoms   [place], [place] W, NP   8 months ago Mixed simple and mucopurulent chronic bronchitis ([place])   [facility], [name], NP       Future Appointments             In 5 months [place], [name], NP [facility], [facility]               Requested Prescriptions  Pending Prescriptions Disp Refills   clonazePAM (KLONOPIN) 0.5 MG tablet 30 tablet 0    Sig: Take 1 tablet (0.5 mg total) by mouth daily as needed for anxiety.     Not Delegated - Psychiatry: Anxiolytics/Hypnotics 2 Failed - 03/19/2022  3:44 PM      Failed - This refill cannot be delegated      Failed - Urine Drug Screen completed  in last 360 days      Passed - Patient is not pregnant      Passed - Valid encounter within last 6 months    Recent Outpatient Visits           5 days ago Encounter for general adult medical examination with abnormal findings   [facility] [place], [name], NP   1 month ago Type 2 diabetes mellitus with other specified complication, without long-term current use of insulin ([place])   [place], Grayland Ormond A, RPH-CPP   2 months ago Type 2 diabetes mellitus with other specified complication, without long-term current use of insulin ([place])   [place], Grayland Ormond A, RPH-CPP   3 months ago Menopausal symptoms   [facility] [place], [place] W, NP   8 months ago Mixed simple and mucopurulent chronic bronchitis [facility])   [facility], [name], NP       Future Appointments             In 5 months [name], [name], NP [facility], [facility]

## 2022-03-19 NOTE — Telephone Encounter (Signed)
Medication Refill - Medication: clonazePAM (KLONOPIN) 0.5 MG tablet   Has the patient contacted their pharmacy? No.  Preferred Pharmacy (with phone number or street name):  [id number] [place] [address] Phone: [phone] Fax: [phone]    Has the patient been seen for an appointment in the last year OR does the patient have an upcoming appointment? Yes.    Agent: Please be advised that RX refills may take up to 3 business days. We ask that you follow-up with your pharmacy.

## 2022-03-20 ENCOUNTER — Encounter: Payer: Self-pay | Admitting: Internal Medicine

## 2022-03-20 MED ORDER — CLONAZEPAM 0.5 MG PO TABS: 0.5000 mg | ORAL_TABLET | Freq: Every day | ORAL | 0 refills | Status: DC | PRN

## 2022-03-24 DIAGNOSIS — J449 Chronic obstructive pulmonary disease, unspecified: Secondary | ICD-10-CM | POA: Diagnosis not present

## 2022-03-28 DIAGNOSIS — E782 Mixed hyperlipidemia: Secondary | ICD-10-CM | POA: Diagnosis not present

## 2022-03-28 DIAGNOSIS — R809 Proteinuria, unspecified: Secondary | ICD-10-CM | POA: Diagnosis not present

## 2022-03-28 DIAGNOSIS — E1129 Type 2 diabetes mellitus with other diabetic kidney complication: Secondary | ICD-10-CM | POA: Diagnosis not present

## 2022-04-04 DIAGNOSIS — R809 Proteinuria, unspecified: Secondary | ICD-10-CM | POA: Diagnosis not present

## 2022-04-04 DIAGNOSIS — E1129 Type 2 diabetes mellitus with other diabetic kidney complication: Secondary | ICD-10-CM | POA: Diagnosis not present

## 2022-04-04 DIAGNOSIS — E785 Hyperlipidemia, unspecified: Secondary | ICD-10-CM | POA: Diagnosis not present

## 2022-04-04 DIAGNOSIS — E1169 Type 2 diabetes mellitus with other specified complication: Secondary | ICD-10-CM | POA: Diagnosis not present

## 2022-04-04 DIAGNOSIS — E1142 Type 2 diabetes mellitus with diabetic polyneuropathy: Secondary | ICD-10-CM | POA: Diagnosis not present

## 2022-04-04 DIAGNOSIS — N951 Menopausal and female climacteric states: Secondary | ICD-10-CM | POA: Diagnosis not present

## 2022-04-04 DIAGNOSIS — R69 Illness, unspecified: Secondary | ICD-10-CM | POA: Diagnosis not present

## 2022-04-04 DIAGNOSIS — F17210 Nicotine dependence, cigarettes, uncomplicated: Secondary | ICD-10-CM | POA: Diagnosis not present

## 2022-04-18 ENCOUNTER — Telehealth: Payer: Self-pay | Admitting: *Deleted

## 2022-04-18 NOTE — Patient Instructions (Signed)
Visit Information  Thank you for taking time to visit with me today. Please don't hesitate to contact me if I can be of assistance to you before our next scheduled telephone appointment.  Following are the goals we discussed today:  Call PCP office if dizziness does not subside.   Our next appointment is by telephone on 1/26  Please call the care guide team at [phone] if you need to cancel or reschedule your appointment.   Please call the Suicide and Crisis Lifeline: 988 call the [place] National Suicide Prevention Lifeline: 310-221-6568 or TTY: 905-369-5851 TTY 2061148562) to talk to a trained counselor call 1-800-273-TALK (toll free, 24 hour hotline) call 911 if you are experiencing a Mental Health or [place] or need someone to talk to.  Patient verbalizes understanding of instructions and care plan provided today and agrees to view in [place]. Active MyChart status and patient understanding of how to access instructions and care plan via MyChart confirmed with patient.     The patient has been provided with contact information for the care management team and has been advised to call with any health related questions or concerns.   Valente David, RN, MSN, [place] Care Management Care Management Coordinator [phone]

## 2022-04-18 NOTE — Patient Outreach (Signed)
  Care Coordination   Initial Visit Note   04/18/2022 Name: Brandi Calderon MRN: [id number] DOB: [birth year]  Brandi Calderon is a 57 y.o. year old female who sees [place], [name], NP for primary care. I spoke with  [name] by phone today.  What matters to the patients health and wellness today?  Complains of some dizziness and headache today, has cough but state it is not more than usual.  Blood sugar was 127, not sure what her blood pressure is, will have daughter check when she comes back over.  Declines offer to call PCP office, state she will feel better with rest.    Goals Addressed             This Visit's Progress    Management of dizziness and fatigue (possibly related to COPD)       Care Coordination Interventions: Provided patient with basic written and verbal COPD education on self care/management/and exacerbation prevention Advised patient to track and manage COPD triggers Provided instruction about proper use of medications used for management of COPD including inhalers Advised patient to self assesses COPD action plan zone and make appointment with provider if in the yellow zone for 48 hours without improvement Screening for signs and symptoms of depression related to chronic disease state  Assessed social determinant of health barriers Encouraged patient to call PCP office for appointment if dizziness does not subside.  Also advised not to drive if she is still dizzy Upcoming appointments:  pharmacy team on 2/7, Pulmonary on 3/11, and PCP on 6/14         SDOH assessments and interventions completed:  Yes  SDOH Interventions Today    Flowsheet Row Most Recent Value  SDOH Interventions   Food Insecurity Interventions Intervention Not Indicated  Housing Interventions Intervention Not Indicated  Transportation Interventions Intervention Not Indicated        Care Coordination Interventions:  Yes, provided   Follow up plan: Follow up call  scheduled for 1/26    Encounter Outcome:  Pt. Visit Completed   Brandi David, RN,MSN, [place] [facility] Care Management Coordinator [phone]

## 2022-04-22 ENCOUNTER — Other Ambulatory Visit: Payer: Self-pay | Admitting: Internal Medicine

## 2022-04-22 NOTE — Telephone Encounter (Signed)
Requested medication (s) are due for refill today: yes  Requested medication (s) are on the active medication list: yes  Last refill:  03/20/22 #30/0  Future visit scheduled: yes  Notes to clinic:  Unable to refill per protocol, cannot delegate.    Requested Prescriptions  Pending Prescriptions Disp Refills   clonazePAM (KLONOPIN) 0.5 MG tablet [Pharmacy Med Name: CLONAZEPAM 0.5 MG TABLET] 30 tablet 0    Sig: TAKE 1 TABLET BY MOUTH EVERY DAY AS NEEDED FOR ANXIETY     Not Delegated - Psychiatry: Anxiolytics/Hypnotics 2 Failed - 04/22/2022 11:18 AM      Failed - This refill cannot be delegated      Failed - Urine Drug Screen completed in last 360 days      Passed - Patient is not pregnant      Passed - Valid encounter within last 6 months    Recent Outpatient Visits           1 month ago Encounter for general adult medical examination with abnormal findings   [facility] [place], [place] W, NP   2 months ago Type 2 diabetes mellitus with other specified complication, without long-term current use of insulin [facility])   [place], Grayland Ormond A, RPH-CPP   3 months ago Type 2 diabetes mellitus with other specified complication, without long-term current use of insulin [facility])   [place], Grayland Ormond A, RPH-CPP   4 months ago Menopausal symptoms   [facility] [place], [place] W, NP   9 months ago Mixed simple and mucopurulent chronic bronchitis [facility])   [facility] [place], [name], NP       Future Appointments             In 1 month Armando Reichert, MD [facility]   In 4 months [place], NP [facility] [place]

## 2022-04-24 ENCOUNTER — Encounter: Payer: Self-pay | Admitting: *Deleted

## 2022-04-24 DIAGNOSIS — J449 Chronic obstructive pulmonary disease, unspecified: Secondary | ICD-10-CM | POA: Diagnosis not present

## 2022-04-26 ENCOUNTER — Other Ambulatory Visit: Payer: Self-pay | Admitting: Internal Medicine

## 2022-04-26 ENCOUNTER — Ambulatory Visit: Payer: Self-pay | Admitting: *Deleted

## 2022-04-26 NOTE — Patient Outreach (Signed)
  Care Coordination   Follow Up Visit Note   04/26/2022 Name: Brandi Calderon MRN: [id number] DOB: [birth year]  Brandi Calderon is a 57 y.o. year old female who sees Brandi Calderon, Brandi Keens, NP for primary care. I spoke with  Brandi Calderon by phone today.  What matters to the patients health and wellness today?  No complaints of dizziness, state she is in need of refill for Clonazepam, waiting on updated prescription to be sent from PCP office.    Goals Addressed             This Visit's Progress    Management of dizziness and fatigue (possibly related to COPD)   On track    Care Coordination Interventions: Provided patient with basic written and verbal COPD education on self care/management/and exacerbation prevention Advised patient to track and manage COPD triggers Provided instruction about proper use of medications used for management of COPD including inhalers Advised patient to self assesses COPD action plan zone and make appointment with provider if in the yellow zone for 48 hours without improvement Screening for signs and symptoms of depression related to chronic disease state  Assessed social determinant of health barriers Encouraged patient to call PCP office for appointment if dizziness does not subside.  Also advised not to drive if she is still dizzy - UPDATE 1/26 - state dizziness has subsided, no further issues Upcoming appointments:  pharmacy team on 2/7, Pulmonary on 3/11, and PCP on 6/14 Message sent to PCP to request refill of Clonazepam be sent to pharmacy         SDOH assessments and interventions completed:  No     Care Coordination Interventions:  Yes, provided   Follow up plan: Follow up call scheduled for 2/29    Encounter Outcome:  Pt. Visit Completed   Brandi David, RN, MSN, [place] Care Management Care Management Coordinator [phone]

## 2022-04-26 NOTE — Patient Instructions (Signed)
Visit Information  Thank you for taking time to visit with me today. Please don't hesitate to contact me if I can be of assistance to you before our next scheduled telephone appointment.  Following are the goals we discussed today:  Call PCP office and pharmacy to follow up on clonazepam refill.  Our next appointment is by telephone on 2/29  Please call the care guide team at [phone] if you need to cancel or reschedule your appointment.   Please call the Suicide and Crisis Lifeline: 988 call the [place] National Suicide Prevention Lifeline: 339-767-2697 or TTY: 619 208 7792 TTY 850-642-3075) to talk to a trained counselor call 1-800-273-TALK (toll free, 24 hour hotline) call 911 if you are experiencing a Mental Health or [place] or need someone to talk to.  Patient verbalizes understanding of instructions and care plan provided today and agrees to view in [place]. Active MyChart status and patient understanding of how to access instructions and care plan via MyChart confirmed with patient.     The patient has been provided with contact information for the care management team and has been advised to call with any health related questions or concerns.   Valente David, RN, MSN, [place] Care Management Care Management Coordinator [phone]

## 2022-04-29 DIAGNOSIS — E119 Type 2 diabetes mellitus without complications: Secondary | ICD-10-CM | POA: Diagnosis not present

## 2022-04-29 DIAGNOSIS — H25013 Cortical age-related cataract, bilateral: Secondary | ICD-10-CM | POA: Diagnosis not present

## 2022-04-29 DIAGNOSIS — H5213 Myopia, bilateral: Secondary | ICD-10-CM | POA: Diagnosis not present

## 2022-04-29 DIAGNOSIS — H524 Presbyopia: Secondary | ICD-10-CM | POA: Diagnosis not present

## 2022-04-29 DIAGNOSIS — Z7984 Long term (current) use of oral hypoglycemic drugs: Secondary | ICD-10-CM | POA: Diagnosis not present

## 2022-04-29 DIAGNOSIS — H2513 Age-related nuclear cataract, bilateral: Secondary | ICD-10-CM | POA: Diagnosis not present

## 2022-04-29 DIAGNOSIS — H52203 Unspecified astigmatism, bilateral: Secondary | ICD-10-CM | POA: Diagnosis not present

## 2022-05-01 DIAGNOSIS — H524 Presbyopia: Secondary | ICD-10-CM | POA: Diagnosis not present

## 2022-05-01 DIAGNOSIS — H52223 Regular astigmatism, bilateral: Secondary | ICD-10-CM | POA: Diagnosis not present

## 2022-05-03 ENCOUNTER — Other Ambulatory Visit: Payer: Self-pay

## 2022-05-03 DIAGNOSIS — Z87891 Personal history of nicotine dependence: Secondary | ICD-10-CM

## 2022-05-03 DIAGNOSIS — F17210 Nicotine dependence, cigarettes, uncomplicated: Secondary | ICD-10-CM

## 2022-05-08 ENCOUNTER — Telehealth: Payer: Medicare HMO

## 2022-05-08 ENCOUNTER — Other Ambulatory Visit: Payer: Self-pay | Admitting: Pharmacist

## 2022-05-08 ENCOUNTER — Ambulatory Visit: Payer: Medicare HMO | Admitting: Pharmacist

## 2022-05-08 DIAGNOSIS — E1165 Type 2 diabetes mellitus with hyperglycemia: Secondary | ICD-10-CM

## 2022-05-08 DIAGNOSIS — F17200 Nicotine dependence, unspecified, uncomplicated: Secondary | ICD-10-CM

## 2022-05-08 DIAGNOSIS — E1169 Type 2 diabetes mellitus with other specified complication: Secondary | ICD-10-CM

## 2022-05-08 NOTE — Telephone Encounter (Signed)
Patient reports she has lost her glucometer ~ 1 week ago, but still has her test strips and lancets. Would you please send a new Rx for [facility]?  Thank you!  Wallace Cullens, PharmD, [place] [phone]

## 2022-05-08 NOTE — Patient Instructions (Signed)
Goals Addressed             This Visit's Progress    Pharmacy Goals       Our goal A1c is less than 7%. This corresponds with fasting sugars less than 130 and 2 hour after meal sugars less than 180. Please check your blood sugar each morning  Our goal bad cholesterol, or LDL, is less than 70 . This is why it is important to continue taking your atorvastatin  Please follow up with the [place] Quitline for their help with quitting smoking.  The  Quitline phone number is: [phone]  Feel free to call me with any questions or concerns. I look forward to our next call!  Wallace Cullens, PharmD, [place] [phone]

## 2022-05-08 NOTE — Progress Notes (Signed)
05/08/2022 Name: Brandi Calderon MRN: [id number] DOB: Jul 21, 1965  Chief Complaint  Patient presents with   Medication Adherence   Medication Management    Brandi Calderon is a 57 y.o. year old female who presented for a telephone visit.   They were referred to the pharmacist by their PCP for assistance in managing diabetes, hypertension, and hyperlipidemia.    Subjective:  Today reports trialed duloxetine 30 mg QHS as prescribed by Endocrinologist on 1/4 for hot flashes symptoms. Reports stopping now after 1 month of taking duloxetine as hot fashes symptoms improved, but unable to tolerated due to bad dreams. - Previous therapies tried: paroxetine 10 mg daily (reports stopped when started duloxetine; denies benefit)  Care Team: Primary Care Provider: [name], NP; Next Scheduled Visit: 09/13/2022 Pulmonologist: [facility]; Next Scheduled Visit: 06/10/2022 Endocrinologist: Dr. Gabriel Carina; Next Scheduled Visit: 08/08/2022 (labs 5/2)  Medication Access/Adherence  [id number] [place] [id number]: [phone]: [phone]  Patient reports affordability concerns with their medications: No  Patient reports access/transportation concerns to their pharmacy: No  Patient reports adherence concerns with their medications:  No    Patient currently using weekly pillbox to organize her medications  Patient remains interested in using pill packaging to manage medications Plans [place]  Di[facility]   Current medications:  Metformin 1000 mg twice daily Trulicity 4.5 mg weekly on Mondays Jardiance 25 mg daily glipizide ER 5 mg daily  Medications tried in the past: Ozempic  (Endocrinologist switched patient back to Trulicity in November due to back order of Ozempic 2 mg strength)   Reports has been unable to check her blood sugar in past 2 weeks as lost her One Touch Verio meter - reports still has test strips and lancets, but not the meter   Reports following specific instructions received from Endocrinologist on 12/20/2021 for slowly trying to increase Ozempic dose up again   Statin: atorvastatin 80 mg daily (dose increased by Endocrinologist on 04/04/2022)  ACE-I: lisinopril 5 mg daily (note started by Endocrinologist on 04/04/2022)   COPD:   Current medications: Trelegy - 1 puff daily Albuterol - 1-2 puffs every 6 hours as needed Overnight oxygen   Reports now using Trelegy consistently and rinsing mouth after each use and using rescue (albuterol) inhaler as needed as directed   Tobacco Abuse: Denies interest in quitting at this time Reports family is her motivation for quitting. States her daughters have told her that they would quit smoking with her Reports planning to start quit with her daughters using nicotine patches and nicotine lozenges as prescribed by Pulmonology Triggers: boredom, others smoking around her, stress Note has discussed starting Chantix with Endocrinologist, but patient denies interest in starting at this time   Objective:  Lab Results  Component Value Date   HGBA1C 7.1 (H) 03/14/2022    Lab Results  Component Value Date   CREATININE 0.71 03/14/2022   BUN 14 03/14/2022   NA 141 03/14/2022   K 4.9 03/14/2022   CL 103 03/14/2022   CO2 30 03/14/2022    Lab Results  Component Value Date   CHOL 170 03/14/2022   HDL 44 (L) 03/14/2022   LDLCALC 103 (H) 03/14/2022   TRIG 132  03/14/2022   CHOLHDL 3.9 03/14/2022   BP Readings from Last 3 Encounters:  03/14/22 112/74  12/31/21 110/68  11/27/21 106/72   Pulse Readings from Last 3 Encounters:  03/14/22 73  12/31/21 69  11/27/21 77     Medications Reviewed Today      Reviewed by [name], RPH-CPP (Pharmacist) on 05/08/22 at 1033  Med List Status: <None>   Medication Order Taking? Sig Documenting Provider Last Dose Status Informant  albuterol (VENTOLIN HFA) 108 (90 Base) MCG/ACT inhaler [id number] Yes INHALE 1-2 PUFFS BY MOUTH EVERY 6 HOURS AS NEEDED FOR WHEEZE OR SHORTNESS OF BREATH [name], [name], NP Taking Active Self  Aspirin-Salicylamide-Caffeine (BC HEADACHE POWDER PO) [id number]  Take 4 packets by mouth daily as needed (pain). [provider]  Active Self  atorvastatin (LIPITOR) 80 MG tablet [id number] Yes Take 1 tablet (80 mg total) by mouth once daily [provider] Taking Active   Blood Glucose Monitoring Suppl [facility] ULTRALINK) w/Device KIT [id number]  1 Device by Does not apply route daily. [name], NP  Active Self  clonazePAM (KLONOPIN) 0.5 MG tablet [id number]  TAKE 1 TABLET BY MOUTH EVERY DAY AS NEEDED FOR ANXIETY [name], NP  Active   fluticasone (FLONASE) 50 MCG/ACT nasal spray [id number]  SPRAY 2 SPRAYS INTO EACH NOSTRIL EVERY DAY [name], NP  Active Self  furosemide (LASIX) 40 MG tablet [id number]  TAKE 1 TABLET BY MOUTH EVERY DAY [name], [name], NP  Active Self  gabapentin (NEURONTIN) 300 MG capsule [id number]  Take 300 mg by mouth at bedtime. [provider]  Active Self  glipiZIDE (GLUCOTROL XL) 5 MG 24 hr tablet [id number] Yes Take 5 mg by mouth daily. [provider] Taking Active Self  glucose blood test strip [id number]  Use as instructed [name], NP  Active Self  JARDIANCE 25 MG TABS tablet [id number] Yes Take 25 mg by mouth daily. [provider] Taking Active Self  Lancets Glory [facility]bush ULTRASOFT) lancets [id number]  Use as instructed [name], NP  Active Self  lisinopril (ZESTRIL) 5 MG tablet [id number] Yes Take 1 tablet (5 mg total) by mouth once daily [provider] Taking Active   metFORMIN (GLUCOPHAGE) 1000 MG tablet [id number] Yes  TAKE 1 TABLET (1,000 MG TOTAL) BY MOUTH 2 (TWO) TIMES DAILY WITH A MEAL. [name], NP Taking Active Self  OXYGEN [id number]  Inhale 1 L into the lungs at bedtime. [provider]  Active Self  pantoprazole (PROTONIX) 40 MG tablet [id number]  TAKE 1 TABLET BY MOUTH EVERY DAY  Patient taking differently: Take 40 mg by mouth daily as needed (Heartburn).   [name], NP  Active Pharmacy Records  Patient not taking:  Discontinued 05/08/22 1033 (Patient Preference) TRELEGY ELLIPTA 100-62.5-25 MCG/ACT AEPB [id number] Yes Inhale 1 puff into the lungs daily. [name], NP Taking Active Self  TRULICITY 4.5 IW/9.7LG Bonney Aid [id number] Yes Inject 4.5 mg into the skin once a week. [provider] Taking Active Self  Med List Note [name], RN 06/25/18 0814): UDS 06-25-2018              Assessment/Plan:   Patient plans to follow up with provider to discuss options or management of hot flashes. Note has tried Cymbalta (bad dreams); paroxetine (lack of benefit)  Diabetes: - Have reviewed dietary modifications including  Have counseled patient on importance of having regular well-balanced meals, while controlling carbohydrate portion sizes Encouraged patient  to reduce intake of sugary beverages, to work on drinking water, rather than soda - Send message to clinical team to request new Rx for One Touch Verio meter [facility] - Patient to pick up new glucometer from pharmacy and restart monitoring home blood sugar in morning before breakfast and at bedtime, keep log of results and have this to review at medical appointments and to call Endocrinologist sooner for reading outside of established parameters or symptoms    COPD: - Reviewed appropriate inhaler technique. - Recommend to continue using Trelegy inhaler consistently every day      Tobacco Abuse - Have provided information on 1 800 QUIT NOW support program   Medication Management: -  Patient interested in use of pill pack to organize medications - Patient to follow up with [place] to discuss having all of her prescriptions transferred from [place] and start pill packaging       Follow Up Plan: Clinic Pharmacist will follow up with patient again on 06/24/2022 at 10 am   Wallace Cullens, PharmD, [place] [phone]

## 2022-05-09 MED ORDER — ONETOUCH VERIO FLEX SYSTEM W/DEVICE KIT: PACK | 0 refills | Status: DC

## 2022-05-13 ENCOUNTER — Other Ambulatory Visit: Payer: Self-pay | Admitting: Internal Medicine

## 2022-05-14 ENCOUNTER — Ambulatory Visit (INDEPENDENT_AMBULATORY_CARE_PROVIDER_SITE_OTHER): Payer: Medicare HMO | Admitting: Internal Medicine

## 2022-05-14 ENCOUNTER — Ambulatory Visit: Payer: Self-pay

## 2022-05-14 ENCOUNTER — Encounter: Payer: Self-pay | Admitting: Internal Medicine

## 2022-05-14 VITALS — BP 126/72 | HR 77 | Temp 97.1°F | Wt 139.0 lb

## 2022-05-14 DIAGNOSIS — D494 Neoplasm of unspecified behavior of bladder: Secondary | ICD-10-CM | POA: Diagnosis not present

## 2022-05-14 DIAGNOSIS — N939 Abnormal uterine and vaginal bleeding, unspecified: Secondary | ICD-10-CM | POA: Diagnosis not present

## 2022-05-14 DIAGNOSIS — I10 Essential (primary) hypertension: Secondary | ICD-10-CM | POA: Diagnosis not present

## 2022-05-14 NOTE — Telephone Encounter (Signed)
  Chief Complaint: post menopausal bleeding Symptoms: last menses 9 years ago, moderate cramping, bloating, mild vaginal bloody discharge Frequency: yest Pertinent Negatives: Patient denies no clots noted Disposition: '[]'$ ED /'[]'$ Urgent Care (no appt availability in office) / '[x]'$ Appointment(In office/virtual)/ '[]'$  [facility]/ '[]'$ Home Care/ '[]'$ Refused Recommended Disposition /'[]'$ [facility]/ '[]'$  Follow-up with PCP Additional Notes: appt today with PCP Reason for Disposition  Postmenopausal vaginal bleeding  Answer Assessment - Initial Assessment Questions 1. AMOUNT: "Describe the bleeding that you are having." "How much bleeding is there?"    - SPOTTING: spotting, or pinkish / brownish mucous discharge; does not fill panty liner or pad    - MILD:  less than 1 pad / hour; less than patient's usual menstrual bleeding   - MODERATE: 1-2 pads / hour; 1 menstrual cup every 6 hours; small-medium blood clots (e.g., pea, grape, small coin)   - SEVERE: soaking 2 or more pads/hour for 2 or more hours; 1 menstrual cup every 2 hours; bleeding not contained by pads or continuous red blood from vagina; large blood clots (e.g., golf ball, large coin)      Mild  2. ONSET: "When did the bleeding begin?" "Is it continuing now?"     Yesterday  3. MENOPAUSE: "When was your last menstrual period?"      9 years ago 4. ABDOMEN PAIN: "Do you have any pain?" "How bad is the pain?"  (e.g., Scale 1-10; mild, moderate, or severe)   - MILD (1-3): doesn't interfere with normal activities, abdomen soft and not tender to touch    - MODERATE (4-7): interferes with normal activities or awakens from sleep, abdomen tender to touch    - SEVERE (8-10): excruciating pain, doubled over, unable to do any normal activities      moderate 5. BLOOD THINNERS: "Do you take any blood thinners?" (e.g., Coumadin/warfarin, Pradaxa/dabigatran, aspirin)     no 6. HORMONE MEDICINES: "Are you taking any hormone medicines,  prescription or OTC?" (e.g., birth control pills, estrogen)     no 7. CAUSE: "What do you think is causing the bleeding?" (e.g., recent gyn surgery, recent gyn procedure; known bleeding disorder, uterine cancer)       *No Answer* 8. HEMODYNAMIC STATUS: "Are you weak or feeling lightheaded?" If Yes, ask: "Can you stand and walk normally?"       no 9. OTHER SYMPTOMS: "What other symptoms are you having with the bleeding?" (e.g., back pain, burning with urination, fever)     Back pain, cramping , bloating  Protocols used: Vaginal Bleeding - Postmenopausal-A-AH

## 2022-05-14 NOTE — Progress Notes (Signed)
Subjective:    Patient ID: Brandi Calderon, female    DOB: 1965/06/01, 58 y.o.   MRN: [id number]  HPI  Patient presents to clinic today with complaint of vaginal bleeding.  This started yesterday.  She is unsure if she is postmenopausal but reports she was given something years ago to stop her period. She has not hada period in many years. Ultrasound from 09/2015 did show evidence of uterine fibroids.  Review of Systems     Past Medical History:  Diagnosis Date   Anxiety    Arthritis    Asthma    Back pain    COPD (chronic obstructive pulmonary disease) (HCC)    Depression    Diabetes mellitus    Headache    History of COVID-19    Hyperlipidemia    Hypertension    Neuromuscular disorder (HCC)    Sleep apnea    Tendonitis     Current Outpatient Medications  Medication Sig Dispense Refill   albuterol (VENTOLIN HFA) 108 (90 Base) MCG/ACT inhaler INHALE 1-2 PUFFS BY MOUTH EVERY 6 HOURS AS NEEDED FOR WHEEZE OR SHORTNESS OF BREATH 6.7 each 3   Aspirin-Salicylamide-Caffeine (BC HEADACHE POWDER PO) Take 4 packets by mouth daily as needed (pain).     atorvastatin (LIPITOR) 80 MG tablet Take 1 tablet (80 mg total) by mouth once daily     Blood Glucose Monitoring Suppl ([place]) w/Device KIT Use to check blood sugar daily.  DX E11.9 1 kit 0   clonazePAM (KLONOPIN) 0.5 MG tablet TAKE 1 TABLET BY MOUTH EVERY DAY AS NEEDED FOR ANXIETY 30 tablet 0   fluticasone (FLONASE) 50 MCG/ACT nasal spray SPRAY 2 SPRAYS INTO EACH NOSTRIL EVERY DAY 48 mL 1   furosemide (LASIX) 40 MG tablet TAKE 1 TABLET BY MOUTH EVERY DAY 90 tablet 0   gabapentin (NEURONTIN) 300 MG capsule Take 300 mg by mouth at bedtime.     glipiZIDE (GLUCOTROL XL) 5 MG 24 hr tablet Take 5 mg by mouth daily.     glucose blood test strip Use as instructed 100 each 0   JARDIANCE 25 MG TABS tablet Take 25 mg by mouth daily.     Lancets (ONETOUCH ULTRASOFT) lancets Use as instructed 100 each 12   lisinopril  (ZESTRIL) 5 MG tablet Take 1 tablet (5 mg total) by mouth once daily     metFORMIN (GLUCOPHAGE) 1000 MG tablet TAKE 1 TABLET (1,000 MG TOTAL) BY MOUTH 2 (TWO) TIMES DAILY WITH A MEAL. 60 tablet 0   OXYGEN Inhale 1 L into the lungs at bedtime.     pantoprazole (PROTONIX) 40 MG tablet TAKE 1 TABLET BY MOUTH EVERY DAY (Patient taking differently: Take 40 mg by mouth daily as needed (Heartburn).) 90 tablet 1   TRELEGY ELLIPTA 100-62.5-25 MCG/ACT AEPB Inhale 1 puff into the lungs daily. 1 each 2   TRULICITY 4.5 0000000 SOPN Inject 4.5 mg into the skin once a week.     No current facility-administered medications for this visit.    Allergies  Allergen Reactions   Penicillins Anaphylaxis    Has patient had a PCN reaction causing immediate rash, facial/tongue/throat swelling, SOB or lightheadedness with hypotension: Yes Has patient had a PCN reaction causing severe rash involving mucus membranes or skin necrosis: No Has patient had a PCN reaction that required hospitalization: No Has patient had a PCN reaction occurring within the last 10 years: No If all of the above answers are "NO", then may proceed  with Cephalosporin use.   Throat swells   Victoza [Liraglutide]     Severe nausea   Vicodin [Hydrocodone-Acetaminophen] Rash    And hives    Family History  Problem Relation Age of Onset   Diabetes Mother    Kidney disease Father    Diabetes Father    Dementia Father    Diabetes Sister    Diabetes Brother    Diabetes Brother    Colon cancer Neg Hx    Esophageal cancer Neg Hx    Stomach cancer Neg Hx    Rectal cancer Neg Hx     Social History   Socioeconomic History   Marital status: Divorced    Spouse name: Not on file   Number of children: Not on file   Years of education: Not on file   Highest education level: Not on file  Occupational History   Not on file  Tobacco Use   Smoking status: Every Day    Packs/day: 1.50    Years: 39.00    Total pack years: 58.50     Types: Cigarettes   Smokeless tobacco: Never   Tobacco comments:    2PPD 10/10/2020  Vaping Use   Vaping Use: Never used  Substance and Sexual Activity   Alcohol use: No   Drug use: Yes    Types: Marijuana    Comment: States she smokes MJ when she does not have any Tramadol.   Sexual activity: Not on file  Other Topics Concern   Not on file  Social History Narrative   ** Merged History Encounter ** In the process of getting SSD for depression, DM and neuropathy      Active smoker [~ 2p/day; cutting down]; no alcohol; lives in [place]; self. Used to work in [employer]    Social Determinants of Health   Financial Resource Strain: Low Risk  (02/01/2022)   Overall Financial Resource Strain (CARDIA)    Difficulty of Paying Living Expenses: Not very hard  Food Insecurity: No Food Insecurity (04/18/2022)   Hunger Vital Sign    Worried About Running Out of Food in the Last Year: Never true    Ran Out of Food in the Last Year: Never true  Transportation Needs: No Transportation Needs (04/18/2022)   PRAPARE - Hydrologist (Medical): No    Lack of Transportation (Non-Medical): No  Physical Activity: Insufficiently Active (02/01/2022)   Exercise Vital Sign    Days of Exercise per Week: 2 days    Minutes of Exercise per Session: 60 min  Stress: No Stress Concern Present (02/01/2022)   [place]    Feeling of Stress : Only a little  Social Connections: Socially Isolated (02/01/2022)   Social Connection and Isolation Panel [NHANES]    Frequency of Communication with Friends and Family: More than three times a week    Frequency of Social Gatherings with Friends and Family: More than three times a week    Attends Religious Services: Never    Marine scientist or Organizations: No    Attends Archivist Meetings: Never    Marital Status: Divorced  Human resources officer Violence: Not At Risk  (02/01/2022)   Humiliation, Afraid, Rape, and Kick questionnaire    Fear of Current or Ex-Partner: No    Emotionally Abused: No    Physically Abused: No    Sexually Abused: No     Constitutional: Denies fever, malaise, fatigue, headache  or abrupt weight changes.  Respiratory: Denies difficulty breathing, shortness of breath, cough or sputum production.   Cardiovascular: Denies chest pain, chest tightness, palpitations or swelling in the hands or feet.  Gastrointestinal: Denies abdominal pain, bloating, constipation, diarrhea or blood in the stool.  GU: Patient reports vaginal bleeding.  Denies urgency, frequency, pain with urination, burning sensation, blood in urine, odor or discharge.  No other specific complaints in a complete review of systems (except as listed in HPI above).  Objective:   Physical Exam   BP 126/72 (BP Location: Left Arm, Patient Position: Sitting, Cuff Size: Normal)   Pulse 77   Temp (!) 97.1 F (36.2 C) (Temporal)   Wt 139 lb (63 kg)   SpO2 99%   BMI 26.26 kg/m   Wt Readings from Last 3 Encounters:  03/14/22 135 lb (61.2 kg)  02/01/22 132 lb (59.9 kg)  12/31/21 132 lb 3.2 oz (60 kg)    General: Appears her stated age, overweight, in NAD. Cardiovascular: Normal rate. Pulmonary/Chest: Normal effort and positive vesicular breath sounds. No respiratory distress. No wheezes, rales or ronchi noted.  Abdomen: Soft and nontender. Normal bowel sounds. No distention or masses noted. Liver, spleen and kidneys non palpable. Neurological: Alert and oriented.    BMET    Component Value Date/Time   NA 141 03/14/2022 1336   NA 139 06/25/2017 1027   NA 134 (L) 12/07/2012 1809   K 4.9 03/14/2022 1336   K 3.4 (L) 12/07/2012 1809   CL 103 03/14/2022 1336   CL 103 12/07/2012 1809   CO2 30 03/14/2022 1336   CO2 25 12/07/2012 1809   GLUCOSE 114 (H) 03/14/2022 1336   GLUCOSE 274 (H) 12/07/2012 1809   BUN 14 03/14/2022 1336   BUN 10 06/25/2017 1027   BUN 10  12/07/2012 1809   CREATININE 0.71 03/14/2022 1336   CALCIUM 9.9 03/14/2022 1336   CALCIUM 8.6 12/07/2012 1809   GFRNONAA >60 09/11/2020 1448   GFRNONAA 84 09/07/2019 1019   GFRAA 97 09/07/2019 1019    Lipid Panel     Component Value Date/Time   CHOL 170 03/14/2022 1336   CHOL 120 12/24/2016 1827   TRIG 132 03/14/2022 1336   HDL 44 (L) 03/14/2022 1336   HDL 29 (L) 12/24/2016 1827   CHOLHDL 3.9 03/14/2022 1336   LDLCALC 103 (H) 03/14/2022 1336    CBC    Component Value Date/Time   WBC 10.5 03/14/2022 1336   RBC 4.77 03/14/2022 1336   HGB 14.4 03/14/2022 1336   HGB 12.8 06/25/2017 1027   HCT 42.9 03/14/2022 1336   HCT 38.3 06/25/2017 1027   PLT 294 03/14/2022 1336   PLT 348 06/25/2017 1027   MCV 89.9 03/14/2022 1336   MCV 90 06/25/2017 1027   MCV 80 12/07/2012 1809   MCH 30.2 03/14/2022 1336   MCHC 33.6 03/14/2022 1336   RDW 13.2 03/14/2022 1336   RDW 15.0 06/25/2017 1027   RDW 17.8 (H) 12/07/2012 1809   LYMPHSABS 3.3 09/11/2020 1448   LYMPHSABS 3.3 (H) 09/11/2016 1131   MONOABS 0.5 09/11/2020 1448   EOSABS 0.3 09/11/2020 1448   EOSABS 0.2 09/11/2016 1131   BASOSABS 0.1 09/11/2020 1448   BASOSABS 0.0 09/11/2016 1131    Hgb A1C Lab Results  Component Value Date   HGBA1C 7.1 (H) 03/14/2022           Assessment & Plan:   Abnormal Uterine Bleeding:  Will obtain pelvic/vaginal ultrasound for further evaluation of  symptoms Will check TSH, FSH, LH  RTC in 4 months for follow-up of chronic additions [name], NP

## 2022-05-14 NOTE — Telephone Encounter (Signed)
Requested Prescriptions  Pending Prescriptions Disp Refills   furosemide (LASIX) 40 MG tablet [Pharmacy Med Name: FUROSEMIDE 40 MG TABLET] 90 tablet 0    Sig: TAKE 1 TABLET BY MOUTH EVERY DAY     Cardiovascular:  Diuretics - Loop Failed - 05/13/2022  6:13 PM      Failed - Mg Level in normal range and within 180 days    Magnesium  Date Value Ref Range Status  09/05/2016 1.4 (L) 1.7 - 2.4 mg/dL Final         Passed - K in normal range and within 180 days    Potassium  Date Value Ref Range Status  03/14/2022 4.9 3.5 - 5.3 mmol/L Final  12/07/2012 3.4 (L) 3.5 - 5.1 mmol/L Final         Passed - Ca in normal range and within 180 days    Calcium  Date Value Ref Range Status  03/14/2022 9.9 8.6 - 10.4 mg/dL Final   Calcium, Total  Date Value Ref Range Status  12/07/2012 8.6 8.5 - 10.1 mg/dL Final   Calcium, Ion  Date Value Ref Range Status  05/09/2010 1.11 (L) 1.12 - 1.32 mmol/L Final         Passed - Na in normal range and within 180 days    Sodium  Date Value Ref Range Status  03/14/2022 141 135 - 146 mmol/L Final  06/25/2017 139 134 - 144 mmol/L Final  12/07/2012 134 (L) 136 - 145 mmol/L Final         Passed - Cr in normal range and within 180 days    Creat  Date Value Ref Range Status  03/14/2022 0.71 0.50 - 1.03 mg/dL Final         Passed - Cl in normal range and within 180 days    Chloride  Date Value Ref Range Status  03/14/2022 103 98 - 110 mmol/L Final  12/07/2012 103 98 - 107 mmol/L Final         Passed - Last BP in normal range    BP Readings from Last 1 Encounters:  03/14/22 112/74         Passed - Valid encounter within last 6 months    Recent Outpatient Visits           6 days ago Type 2 diabetes mellitus with hyperglycemia, without long-term current use of insulin ([place])   [place], Virl Diamond, RPH-CPP   2 months ago Encounter for general adult medical examination with abnormal findings   [facility] [place], [name], NP   3 months ago Type 2 diabetes mellitus with other specified complication, without long-term current use of insulin ([place])   [place], Grayland Ormond A, RPH-CPP   4 months ago Type 2 diabetes mellitus with other specified complication, without long-term current use of insulin ([place])   [place], Grayland Ormond A, RPH-CPP   5 months ago Menopausal symptoms   [facility] [place], [name], NP       Future Appointments             Today Garnette Gunner, [name], NP [facility], [place]   In 3 weeks Armando Reichert, MD [facility] at [place]   In 4 months [place], NP [facility], [facility]

## 2022-05-14 NOTE — Patient Instructions (Signed)
Abnormal Uterine Bleeding Abnormal uterine bleeding means bleeding more than normal from your womb (uterus). It can include: Bleeding after sex. Bleeding between monthly (menstrual) periods. Bleeding that is heavier than normal. Monthly periods that last longer than normal. Bleeding after you have stopped having your monthly period (menopause). You should see a doctor for any kind of bleeding that is not normal. Treatment depends on the cause of your bleeding and how much you bleed. Follow these instructions at home: Medicines Take over-the-counter and prescription medicines only as told by your doctor. Ask your doctor about: Taking medicines such as aspirin and ibuprofen. Do not take these medicines unless your doctor tells you to take them. Taking over-the-counter medicines, vitamins, herbs, and supplements. You may be given iron pills. Take them as told by your doctor. Managing constipation If you take iron pills, you may need to take these actions to prevent or treat trouble pooping (constipation): Drink enough fluid to keep your pee (urine) pale yellow. Take over-the-counter or prescription medicines. Eat foods that are high in fiber. These include beans, whole grains, and fresh fruits and vegetables. Limit foods that are high in fat and sugar. These include fried or sweet foods. Activity Change your activity to decrease bleeding if you need to change your sanitary pad more than one time every 2 hours: Lie in bed with your feet raised (elevated). Place a cold pack on your lower belly. Rest as much as you are able until the bleeding stops or slows down. General instructions Do not use tampons, douche, or have sex until your doctor says these things are okay. Change your pads often. Get regular exams. These include: Pelvic exams. Screenings for cancer of the cervix. It is up to you to get the results of any tests that are done. Ask how to get your results when they are  ready. Watch for any changes in your bleeding. For 2 months, write down: When your monthly period starts. When your monthly period ends. When you get any abnormal bleeding from your vagina. What problems you notice. Keep all follow-up visits. Contact a doctor if: The bleeding lasts more than one week. You feel dizzy at times. You feel like you may vomit (nausea). You vomit. You feel light-headed or weak. Your symptoms get worse. Get help right away if: You faint. You have to change pads every hour. You have pain in your belly. You have a fever or chills. You get sweaty or weak. You pass large blood clots from your vagina. These symptoms may be an emergency. Get help right away. Call your local emergency services (911 in the [place] Do not wait to see if the symptoms will go away. Do not drive yourself to the hospital. Summary Abnormal uterine bleeding means bleeding more than normal from your womb (uterus). Any kind of bleeding that is not normal should be checked by a doctor. Treatment depends on the cause of your bleeding and how much you bleed. Get help right away if you faint, you have to change pads every hour, or you pass large blood clots from your vagina. This information is not intended to replace advice given to you by your health care provider. Make sure you discuss any questions you have with your health care provider. Document Revised: 07/18/2020 Document Reviewed: 07/18/2020 Elsevier Patient Education  2023 Elsevier Inc.  

## 2022-05-15 ENCOUNTER — Ambulatory Visit: Payer: Self-pay | Admitting: *Deleted

## 2022-05-15 ENCOUNTER — Ambulatory Visit
Admission: RE | Admit: 2022-05-15 | Discharge: 2022-05-15 | Disposition: A | Discharge status: Home / Self Care | Payer: Medicare HMO | Source: Ambulatory Visit | Attending: Internal Medicine | Admitting: Internal Medicine

## 2022-05-15 DIAGNOSIS — N939 Abnormal uterine and vaginal bleeding, unspecified: Secondary | ICD-10-CM

## 2022-05-15 DIAGNOSIS — N950 Postmenopausal bleeding: Secondary | ICD-10-CM | POA: Diagnosis not present

## 2022-05-15 DIAGNOSIS — N329 Bladder disorder, unspecified: Secondary | ICD-10-CM | POA: Diagnosis not present

## 2022-05-15 LAB — TSH: TSH: 2.34 mIU/L

## 2022-05-15 LAB — FSH/LH
FSH: 63.1 m[IU]/mL
LH: 36 m[IU]/mL

## 2022-05-15 NOTE — Telephone Encounter (Signed)
Pt given lab results per notes of R. Garnette Gunner, NP  on 05/15/22. Pt verbalized understanding of note.  FSH/LH indicate that you are postmenopausal.  Your thyroid function is normal.  I will await the results of the pelvic ultrasound for further evaluation.   Patient asking if PCP is going to call "in something" for bleeding. Requesting to know how long it will take to get results from pelvic ultrasound. Reviewed that PCP will have to review results and may take greater than 24 hours for results. Patient scheduled for ultrasound today.

## 2022-05-15 NOTE — Telephone Encounter (Signed)
Rubi from [facility] called critical ultrasound results. Results noted in chart  IMPRESSION: Large irregular mass is noted in urinary bladder which is highly concerning for malignancy. Consultation with urology as well as cystoscopy is recommended. These results will be called to the ordering clinician or representative by the Radiologist Assistant, and communication documented in the PACS or zVision Dashboard.   Endometrial thickness is measured at 3 mm. In the setting of post-menopausal bleeding, this is consistent with a benign etiology such as endometrial atrophy. If bleeding remains unresponsive to hormonal or medical therapy, sonohysterogram should be considered for focal lesion work-up. (Ref: Radiological Reasoning: Algorithmic Workup of Abnormal Vaginal Bleeding with Endovaginal Sonography and Sonohysterography. AJR [id number]).   Ovaries are not visualized.    attempted to call [facility] no answer for call at 5:00pm. Please advise

## 2022-05-16 NOTE — Addendum Note (Signed)
Addended by: [name] on: 05/16/2022 08:06 AM   Modules accepted: Orders

## 2022-05-16 NOTE — Telephone Encounter (Signed)
There is nothing I can call in at this time until I can figure out what is causing the bleeding.  I have to wait until I get the ultrasound results.

## 2022-05-16 NOTE — Telephone Encounter (Signed)
Patient notified of results by this provider

## 2022-05-19 ENCOUNTER — Other Ambulatory Visit: Payer: Self-pay | Admitting: Internal Medicine

## 2022-05-20 ENCOUNTER — Ambulatory Visit
Admission: RE | Admit: 2022-05-20 | Discharge: 2022-05-20 | Disposition: A | Discharge status: Home / Self Care | Payer: Medicare HMO | Source: Ambulatory Visit | Attending: Acute Care | Admitting: Acute Care

## 2022-05-20 DIAGNOSIS — I2510 Atherosclerotic heart disease of native coronary artery without angina pectoris: Secondary | ICD-10-CM | POA: Diagnosis not present

## 2022-05-20 DIAGNOSIS — I700 Atherosclerosis of aorta: Secondary | ICD-10-CM | POA: Insufficient documentation

## 2022-05-20 DIAGNOSIS — Z122 Encounter for screening for malignant neoplasm of respiratory organs: Secondary | ICD-10-CM | POA: Diagnosis not present

## 2022-05-20 DIAGNOSIS — J439 Emphysema, unspecified: Secondary | ICD-10-CM | POA: Diagnosis not present

## 2022-05-20 DIAGNOSIS — Z87891 Personal history of nicotine dependence: Secondary | ICD-10-CM | POA: Diagnosis not present

## 2022-05-20 DIAGNOSIS — R69 Illness, unspecified: Secondary | ICD-10-CM | POA: Diagnosis not present

## 2022-05-20 DIAGNOSIS — F17210 Nicotine dependence, cigarettes, uncomplicated: Secondary | ICD-10-CM | POA: Diagnosis not present

## 2022-05-20 NOTE — Telephone Encounter (Signed)
Unable to refill per protocol, Rx expired. Discontinued 05/08/22, patient preference.  Requested Prescriptions  Pending Prescriptions Disp Refills   PARoxetine (PAXIL) 10 MG tablet [Pharmacy Med Name: PAROXETINE HCL 10 MG TABLET] 90 tablet 1    Sig: TAKE 1 TABLET BY MOUTH EVERY DAY     Psychiatry:  Antidepressants - SSRI Passed - 05/19/2022  8:19 AM      Passed - Completed PHQ-2 or PHQ-9 in the last 360 days      Passed - Valid encounter within last 6 months    Recent Outpatient Visits           6 days ago Abnormal uterine bleeding   [facility] [place], [place] W, NP   1 week ago Type 2 diabetes mellitus with hyperglycemia, without long-term current use of insulin [facility])   [facility] [place], Virl Diamond, RPH-CPP   2 months ago Encounter for general adult medical examination with abnormal findings   [facility] [place], [place] W, NP   3 months ago Type 2 diabetes mellitus with other specified complication, without long-term current use of insulin [facility])   [place], Grayland Ormond A, RPH-CPP   4 months ago Type 2 diabetes mellitus with other specified complication, without long-term current use of insulin ([place])   [place], RPH-CPP       Future Appointments             In 2 days Diamantina [place], Herbert Seta, MD [facility] [place]   In 3 weeks Armando Reichert, MD [facility] at [place]   In 3 months [place], NP [facility], [facility]

## 2022-05-21 ENCOUNTER — Other Ambulatory Visit: Payer: Self-pay | Admitting: Internal Medicine

## 2022-05-21 ENCOUNTER — Other Ambulatory Visit: Payer: Self-pay

## 2022-05-21 DIAGNOSIS — F17210 Nicotine dependence, cigarettes, uncomplicated: Secondary | ICD-10-CM

## 2022-05-21 DIAGNOSIS — Z87891 Personal history of nicotine dependence: Secondary | ICD-10-CM

## 2022-05-21 DIAGNOSIS — I700 Atherosclerosis of aorta: Secondary | ICD-10-CM | POA: Insufficient documentation

## 2022-05-21 DIAGNOSIS — I2510 Atherosclerotic heart disease of native coronary artery without angina pectoris: Secondary | ICD-10-CM | POA: Insufficient documentation

## 2022-05-22 ENCOUNTER — Other Ambulatory Visit: Payer: Self-pay | Admitting: *Deleted

## 2022-05-22 ENCOUNTER — Encounter: Payer: Self-pay | Admitting: Urology

## 2022-05-22 ENCOUNTER — Other Ambulatory Visit
Admission: RE | Admit: 2022-05-22 | Discharge: 2022-05-22 | Disposition: A | Discharge status: Home / Self Care | Payer: Medicare HMO | Source: Home / Self Care | Attending: Urology | Admitting: Urology

## 2022-05-22 ENCOUNTER — Ambulatory Visit (INDEPENDENT_AMBULATORY_CARE_PROVIDER_SITE_OTHER): Payer: Medicare HMO | Admitting: Urology

## 2022-05-22 ENCOUNTER — Ambulatory Visit
Admission: RE | Admit: 2022-05-22 | Discharge: 2022-05-22 | Disposition: A | Discharge status: Home / Self Care | Payer: Medicare HMO | Source: Ambulatory Visit | Attending: Urology | Admitting: Urology

## 2022-05-22 ENCOUNTER — Other Ambulatory Visit: Payer: Self-pay | Admitting: Urology

## 2022-05-22 ENCOUNTER — Telehealth: Payer: Self-pay

## 2022-05-22 VITALS — BP 126/74 | HR 83 | Ht 61.0 in | Wt 138.0 lb

## 2022-05-22 DIAGNOSIS — D494 Neoplasm of unspecified behavior of bladder: Secondary | ICD-10-CM

## 2022-05-22 DIAGNOSIS — N390 Urinary tract infection, site not specified: Secondary | ICD-10-CM

## 2022-05-22 DIAGNOSIS — K3189 Other diseases of stomach and duodenum: Secondary | ICD-10-CM | POA: Diagnosis not present

## 2022-05-22 DIAGNOSIS — R310 Gross hematuria: Secondary | ICD-10-CM | POA: Diagnosis not present

## 2022-05-22 DIAGNOSIS — N289 Disorder of kidney and ureter, unspecified: Secondary | ICD-10-CM | POA: Diagnosis not present

## 2022-05-22 DIAGNOSIS — C679 Malignant neoplasm of bladder, unspecified: Secondary | ICD-10-CM | POA: Diagnosis not present

## 2022-05-22 HISTORY — DX: Urinary tract infection, site not specified: N39.0

## 2022-05-22 LAB — URINALYSIS, COMPLETE (UACMP) WITH MICROSCOPIC
Bilirubin Urine: NEGATIVE
Glucose, UA: NEGATIVE mg/dL
Ketones, ur: NEGATIVE mg/dL
Nitrite: POSITIVE — AB
Protein, ur: 100 mg/dL — AB
Specific Gravity, Urine: >1.03 — ABNORMAL HIGH (ref 1.005–1.030)
WBC, UA: >50 WBC/hpf (ref 0–5)
pH: 6 (ref 5.0–8.0)

## 2022-05-22 LAB — POCT I-STAT CREATININE: Creatinine, Ser: 0.7 mg/dL (ref 0.44–1.00)

## 2022-05-22 MED ORDER — NITROFURANTOIN MONOHYD MACRO 100 MG PO CAPS: 100.0000 mg | ORAL_CAPSULE | Freq: Two times a day (BID) | ORAL | 0 refills | Status: DC

## 2022-05-22 MED ORDER — IOHEXOL 300 MG/ML  SOLN
125.0000 mL | Freq: Once | INTRAMUSCULAR | Status: AC | PRN
  Administered 2022-05-22: 125 mL via INTRAVENOUS

## 2022-05-22 NOTE — Progress Notes (Signed)
   [facility] From  Surgery Date: Date: 06/07/2022  Surgeon: Dr. Nickolas Madrid, MD  Inpt ( No  )   Outpt (Yes)   Obs ( No  )   Diagnosis: N49.4 Bladder Tumor  -CPT: [id number]  Surgery: Transurethral Resection of Bladder Tumor  Stop Anticoagulations: Yes and also hold ASA  Cardiac/Medical/Pulmonary Clearance needed: no  *Orders entered into EPIC  Date: 05/22/22   *Case booked in [place]  Date: 05/22/22  *Notified pt of Surgery: Date: 05/22/22  PRE-OP UA & CX: no  *Placed into Prior Authorization Work [place] Date: 05/22/22  Assistant/laser/rep:No

## 2022-05-22 NOTE — H&P (View-Only) (Signed)
 05/22/22 11:36 AM   Brandi Calderon 10/16/1965 [id number]  CC: Gross hematuria  HPI: 56-year-old female referred for gross hematuria and bladder tumor seen on recent pelvic ultrasound.  She developed gross hematuria that she originally thought may have been vaginal bleeding with some crampy abdominal pain, and PCP ordered a pelvic ultrasound.  This showed a large irregular mass within the bladder concerning for malignancy, measuring approximately 7 cm.  There is no cross-sectional imaging to review of the abdomen and pelvis.  CT chest on 05/20/2022 for routine lung cancer screening was benign.  Urine has cleared and she denies any gross hematuria over the last week.  She denies any weight loss, no back pain or other abdominal pain.  She has a 40+ pack year smoking history, continues to smoke 1 pack/day.  Uses 1 L oxygen overnight.  She is here with her daughter today who helps provide some of the history.  Urinalysis today with 0-5 squamous cells, greater than 50 WBC, 20-50 RBC, many bacteria, small leukocytes, nitrite positive  PMH: Past Medical History:  Diagnosis Date   Anxiety    Arthritis    Asthma    Back pain    COPD (chronic obstructive pulmonary disease) (HCC)    Depression    Diabetes mellitus    Headache    History of COVID-19    Hyperlipidemia    Hypertension    Neuromuscular disorder (HCC)    Sleep apnea    Tendonitis     Surgical History: Past Surgical History:  Procedure Laterality Date   CESAREAN SECTION     CESAREAN SECTION     3 times   COLONOSCOPY     cyst on neck     as a baby/right side   Family History: Family History  Problem Relation Age of Onset   Diabetes Mother    Kidney disease Father    Diabetes Father    Dementia Father    Diabetes Sister    Diabetes Brother    Diabetes Brother    Colon cancer Neg Hx    Esophageal cancer Neg Hx    Stomach cancer Neg Hx    Rectal cancer Neg Hx     Social History:  reports that she has been  smoking cigarettes. She has a 58.50 pack-year smoking history. She has been exposed to tobacco smoke. She has never used smokeless tobacco. She reports current drug use. Drug: Marijuana. She reports that she does not drink alcohol.  Physical Exam: BP 126/74   Pulse 83   Ht 5' 1" (1.549 m)   Wt 138 lb (62.6 kg)   BMI 26.07 kg/m    Constitutional:  Alert and oriented, No acute distress. Cardiovascular: No clubbing, cyanosis, or edema. Respiratory: Normal respiratory effort, no increased work of breathing. GI: Abdomen is soft, nontender, nondistended, no abdominal masses   Laboratory Data: Urinalysis today 0-5 squamous cells, greater than 50 WBC, 20-50 RBC, many bacteria, small leukocytes, nitrate positive.  Pertinent Imaging: I have personally viewed and interpreted the pelvic ultrasound as well as the CT urogram showing a large 7 cm bladder tumor encompassing majority of the bladder, no evidence of hydronephrosis or metastatic disease.  Assessment & Plan:   56-year-old female with extensive smoking history, gross hematuria, and large 7 cm bladder tumor on recent ultrasound confirmed on CT today, no evidence of metastatic disease on CT.  We discussed transurethral resection of bladder tumor (TURBT) and risks and benefits at length. This is typically a   1 to 2-hour procedure done under general anesthesia in the operating room.  A scope is inserted through the urethra and used to resect abnormal tissue within the bladder, which is then sent to the pathologist to determine grade and stage of the tumor.  Risks include bleeding, infection, need for temporary Foley placement, and bladder perforation.  Treatment strategies are based on the type of tumor and depth of invasion.  We reviewed the different treatment pathways for non-muscle invasive and muscle invasive bladder cancer.  -Start nitrofurantoin for suspected UTI, follow-up urine culture -Will schedule TURBT in 1 to 2 weeks  I spent 65  total minutes on the day of the encounter including pre-visit review of the medical record, face-to-face time with the patient, and post visit ordering of labs/imaging/tests.  Cheris Tweten, MD 05/22/2022  [facility] [address], [address] [place], [place] [address] [phone]   

## 2022-05-22 NOTE — Progress Notes (Signed)
Surgical Physician Order Form [facility]  * Scheduling expectation :  06/07/2022  *Length of Case: 2 hours  *Clearance needed: no  *Anticoagulation Instructions: Hold all anticoagulants  *Aspirin Instructions: Hold Aspirin  *Post-op visit Date/Instructions:  1-2 week with pathology review  *Diagnosis: Bladder Tumor  *Procedure:     TURBT >5cm [id number])   Additional orders: N/A  -Admit type: OUTpatient  -Anesthesia: General  -VTE Prophylaxis Standing Order SCD's       Other:   -Standing Lab Orders Per Anesthesia    Lab other: None  -Standing Test orders EKG/Chest x-ray per Anesthesia       Test other:   - Medications:  Gentamicin per pharmacy  -Other orders:  N/A

## 2022-05-22 NOTE — Patient Instructions (Addendum)
Appt is made for today at 1pm at outpatient imaging ([address] the patient needs to start drinking 32 ounces of water at 12:30 please    Transurethral Resection of Bladder Tumor  Transurethral resection of a bladder tumor is the removal (resection) of cancerous tissue (tumor) from the inside wall of the bladder. The bladder is the organ that holds urine. The tumor is removed through the tube that carries urine out of the body (urethra). In a transurethral resection, a thin telescope with a light, a tiny camera, and an electric cutting edge (resectoscope) is passed through the urethra. In men, the opening of the urethra is at the end of the penis. In women, it is just above the opening of the vagina. Tell a health care provider about: Any allergies you have. All medicines you are taking, including vitamins, herbs, eye drops, creams, and over-the-counter medicines. Any problems you or family members have had with anesthetic medicines. Any bleeding problems you have. Any surgeries you have had. Any medical conditions you have, including recent urinary tract infections. Whether you are pregnant or may be pregnant. What are the risks? Generally, this is a safe procedure. However, problems may occur, including: Infection. Bleeding. Allergic reactions to medicines. Damage to nearby structures or organs. Difficulty urinating from blockage of the urethra or not being able to urinate (urinary retention). Deep vein thrombosis. This is a blood clot that can develop in your leg. Recurring cancer. What happens before the procedure? When to stop eating and drinking Follow instructions from your health care provider about what you may eat and drink before your procedure. These may include: 8 hours before your procedure Stop eating most foods. Do not eat meat, fried foods, or fatty foods. Eat only light foods, such as toast or crackers. All liquids are okay except energy drinks and  alcohol. 6 hours before your procedure Stop eating. Drink only clear liquids, such as water, clear fruit juice, black coffee, plain tea, and sports drinks. Do not drink energy drinks or alcohol. 2 hours before your procedure Stop drinking all liquids. You may be allowed to take medicines with small sips of water. Medicines Ask your health care provider about: Changing or stopping your regular medicines. This is especially important if you are taking diabetes medicines or blood thinners. Taking medicines such as aspirin and ibuprofen. These medicines can thin your blood. Do not take these medicines unless your health care provider tells you to take them. Taking over-the-counter medicines, vitamins, herbs, and supplements. General instructions If you will be going home right after the procedure, plan to have a responsible adult: Take you home from the hospital or clinic. You will not be allowed to drive. Care for you for the time you are told. Ask your health care provider what steps will be taken to help prevent infection. These steps may include: Washing skin with a germ-killing soap. Taking antibiotic medicine. Do not use any products that contain nicotine or tobacco for at least 4 weeks before the procedure. These products include cigarettes, chewing tobacco, and vaping devices, such as e-cigarettes. If you need help quitting, ask your health care provider. What happens during the procedure? An IV will be inserted into one of your veins. You will be given one or more of the following: A medicine to help you relax (sedative). A medicine that is injected into your spine to numb the area below and slightly above the injection site (spinal anesthetic). A medicine that is injected into an area  of your body to numb everything below the injection site (regional anesthetic). A medicine to make you fall asleep (general anesthetic). Your legs will be placed in foot rests (stirrups) to open your  legs and bend your knees. The resectoscope will be passed through your urethra and into your bladder. The part of your bladder with the tumor will be resected by the cutting edge of the resectoscope. Fluid will be passed to rinse out the cut tissues (irrigation). The resectoscope will then be taken out. A small, thin tube (catheter) will be passed through your urethra and into your bladder. The catheter will drain urine into a bag outside of your body. The procedure may vary among health care providers and hospitals. What happens after the procedure? Your blood pressure, heart rate, breathing rate, and blood oxygen level will be monitored until you leave the hospital or clinic. You may continue to receive fluids and medicines through an IV. You will be given pain medicine to relieve pain. You will have a catheter to drain your urine. The amount of urine will be measured. If you have blood in your urine, your bladder may be rinsed out by passing fluid through your catheter. You will be encouraged to walk as soon as you can. You may have to wear compression stockings. These stockings help to prevent blood clots and reduce swelling in your legs. If you were given a sedative during the procedure, it can affect you for several hours. Do not drive or operate machinery until your health care provider says that it is safe. Summary Transurethral resection of a bladder tumor is the removal (resection) of a cancerous growth (tumor) on the inside wall of the bladder. To do this procedure, your health care provider uses a thin telescope with a light, a tiny camera, and an electric cutting edge (resectoscope) that is guided to your bladder through your urethra. The part of your bladder that is affected by the tumor will be resected by the cutting edge of the resectoscope. A catheter will be passed through your urethra and into your bladder. The catheter will drain urine into a bag outside of your body. If you  will be going home right after the procedure, plan to have a responsible adult take you home from the hospital or clinic. You will not be allowed to drive. This information is not intended to replace advice given to you by your health care provider. Make sure you discuss any questions you have with your health care provider. Document Revised: 03/23/2021 Document Reviewed: 03/23/2021 Elsevier Patient Education  [place].

## 2022-05-22 NOTE — Telephone Encounter (Signed)
I spoke with Brandi Calderon. We have discussed possible surgery dates and Friday March 8th, 2024 was agreed upon by all parties. Patient given information about surgery date, what to expect pre-operatively and post operatively.  We discussed that a Pre-Admission Testing office will be calling to set up the pre-op visit that will take place prior to surgery, and that these appointments are typically done over the phone with a Pre-Admissions RN. Informed patient that our office will communicate any additional care to be provided after surgery. Patients questions or concerns were discussed during our call. Advised to call our office should there be any additional information, questions or concerns that arise. Patient verbalized understanding.

## 2022-05-22 NOTE — Progress Notes (Signed)
05/22/22 11:36 AM   Brandi Calderon 09-11-1965 [id number]  CC: Gross hematuria  HPI: 57 year old female referred for gross hematuria and bladder tumor seen on recent pelvic ultrasound.  She developed gross hematuria that she originally thought may have been vaginal bleeding with some crampy abdominal pain, and PCP ordered a pelvic ultrasound.  This showed a large irregular mass within the bladder concerning for malignancy, measuring approximately 7 cm.  There is no cross-sectional imaging to review of the abdomen and pelvis.  CT chest on 05/20/2022 for routine lung cancer screening was benign.  Urine has cleared and she denies any gross hematuria over the last week.  She denies any weight loss, no back pain or other abdominal pain.  She has a 40+ pack year smoking history, continues to smoke 1 pack/day.  Uses 1 L oxygen overnight.  She is here with her daughter today who helps provide some of the history.  Urinalysis today with 0-5 squamous cells, greater than 50 WBC, 20-50 RBC, many bacteria, small leukocytes, nitrite positive  PMH: Past Medical History:  Diagnosis Date   Anxiety    Arthritis    Asthma    Back pain    COPD (chronic obstructive pulmonary disease) (HCC)    Depression    Diabetes mellitus    Headache    History of COVID-19    Hyperlipidemia    Hypertension    Neuromuscular disorder ([place])    Sleep apnea    Tendonitis     Surgical History: Past Surgical History:  Procedure Laterality Date   CESAREAN SECTION     CESAREAN SECTION     3 times   COLONOSCOPY     cyst on neck     as a baby/right side   Family History: Family History  Problem Relation Age of Onset   Diabetes Mother    Kidney disease Father    Diabetes Father    Dementia Father    Diabetes Sister    Diabetes Brother    Diabetes Brother    Colon cancer Neg Hx    Esophageal cancer Neg Hx    Stomach cancer Neg Hx    Rectal cancer Neg Hx     Social History:  reports that she has been  smoking cigarettes. She has a 58.50 pack-year smoking history. She has been exposed to tobacco smoke. She has never used smokeless tobacco. She reports current drug use. Drug: Marijuana. She reports that she does not drink alcohol.  Physical Exam: BP 126/74   Pulse 83   Ht 5' 1"$  (1.549 m)   Wt 138 lb (62.6 kg)   BMI 26.07 kg/m    Constitutional:  Alert and oriented, No acute distress. Cardiovascular: No clubbing, cyanosis, or edema. Respiratory: Normal respiratory effort, no increased work of breathing. GI: Abdomen is soft, nontender, nondistended, no abdominal masses   Laboratory Data: Urinalysis today 0-5 squamous cells, greater than 50 WBC, 20-50 RBC, many bacteria, small leukocytes, nitrate positive.  Pertinent Imaging: I have personally viewed and interpreted the pelvic ultrasound as well as the CT urogram showing a large 7 cm bladder tumor encompassing majority of the bladder, no evidence of hydronephrosis or metastatic disease.  Assessment & Plan:   57 year old female with extensive smoking history, gross hematuria, and large 7 cm bladder tumor on recent ultrasound confirmed on CT today, no evidence of metastatic disease on CT.  We discussed transurethral resection of bladder tumor (TURBT) and risks and benefits at length. This is typically a  1 to 2-hour procedure done under general anesthesia in the operating room.  A scope is inserted through the urethra and used to resect abnormal tissue within the bladder, which is then sent to the pathologist to determine grade and stage of the tumor.  Risks include bleeding, infection, need for temporary Foley placement, and bladder perforation.  Treatment strategies are based on the type of tumor and depth of invasion.  We reviewed the different treatment pathways for non-muscle invasive and muscle invasive bladder cancer.  -Start nitrofurantoin for suspected UTI, follow-up urine culture -Will schedule TURBT in 1 to 2 weeks  I spent 65  total minutes on the day of the encounter including pre-visit review of the medical record, face-to-face time with the patient, and post visit ordering of labs/imaging/tests.  Nickolas Madrid, MD 05/22/2022  [facility] [address] [place], [place] [address] [phone]

## 2022-05-22 NOTE — Addendum Note (Signed)
Addended by: [place] on: 05/22/2022 03:16 PM   Modules accepted: Orders

## 2022-05-23 ENCOUNTER — Telehealth: Payer: Self-pay

## 2022-05-23 ENCOUNTER — Encounter (HOSPITAL_COMMUNITY): Payer: Self-pay | Admitting: Gastroenterology

## 2022-05-23 NOTE — Telephone Encounter (Signed)
Called pt to let her know that Dr. Tarri Glenn had recommended that pt wait to have colonoscopy until after she has healed from surgery. Pt verbalized understanding. Colonoscopy canceled.

## 2022-05-24 LAB — URINE CULTURE: Culture: >=100000 — AB

## 2022-05-25 DIAGNOSIS — J449 Chronic obstructive pulmonary disease, unspecified: Secondary | ICD-10-CM | POA: Diagnosis not present

## 2022-05-28 ENCOUNTER — Other Ambulatory Visit: Payer: Medicare HMO

## 2022-05-30 ENCOUNTER — Ambulatory Visit: Payer: Self-pay | Admitting: *Deleted

## 2022-05-30 ENCOUNTER — Ambulatory Visit (HOSPITAL_COMMUNITY): Admit: 2022-05-30 | Payer: Medicare HMO | Admitting: Gastroenterology

## 2022-05-30 SURGERY — COLONOSCOPY WITH PROPOFOL
Anesthesia: Monitor Anesthesia Care

## 2022-05-30 NOTE — Patient Outreach (Signed)
  Care Coordination   Follow Up Visit Note   05/30/2022 Name: Temeka Swanigan MRN: [id number] DOB: [birth year]  Irah Orlosky is a 57 y.o. year old female who sees [place], [name], NP for primary care. I spoke with  [name] by phone today.  What matters to the patients health and wellness today?  New diagnosis of bladder tumor, will be removed on 3/8.  Colonoscopy was canceled due to surgery, will reschedule once recovered.     Goals Addressed             This Visit's Progress    Effective surgery without complications       Care Coordination Interventions: Evaluation of current treatment plan related to transurethral resection of bladder tumor and patient's adherence to plan as established by provider Advised patient to discuss abdominal pain with provider if unable to manage Reviewed scheduled/upcoming provider appointments including 3/4 for pre-admission testing, 3/11 for pulmonary, and 3/14 for urology.  Surgery scheduled for 3/8 Discussed plans with patient for ongoing care management follow up and provided patient with direct contact information for care management team         SDOH assessments and interventions completed:  No     Care Coordination Interventions:  Yes, provided   Follow up plan: Follow up call scheduled for 3/18    Encounter Outcome:  Pt. Visit Completed   Valente David, RN, MSN, [place] Care Management Care Management Coordinator [phone]

## 2022-06-03 ENCOUNTER — Encounter: Payer: Self-pay | Admitting: *Deleted

## 2022-06-03 ENCOUNTER — Encounter
Admission: RE | Admit: 2022-06-03 | Discharge: 2022-06-03 | Disposition: A | Discharge status: Home / Self Care | Payer: Medicare HMO | Source: Ambulatory Visit | Attending: Urology | Admitting: Urology

## 2022-06-03 VITALS — Ht 61.0 in | Wt 130.0 lb

## 2022-06-03 DIAGNOSIS — E119 Type 2 diabetes mellitus without complications: Secondary | ICD-10-CM

## 2022-06-03 DIAGNOSIS — I700 Atherosclerosis of aorta: Secondary | ICD-10-CM

## 2022-06-03 DIAGNOSIS — I2510 Atherosclerotic heart disease of native coronary artery without angina pectoris: Secondary | ICD-10-CM

## 2022-06-03 DIAGNOSIS — E785 Hyperlipidemia, unspecified: Secondary | ICD-10-CM

## 2022-06-03 DIAGNOSIS — E1169 Type 2 diabetes mellitus with other specified complication: Secondary | ICD-10-CM

## 2022-06-03 NOTE — Patient Instructions (Addendum)
Your procedure is scheduled on: Friday June 07, 2022. Report to the Registration Desk on the 1st floor of the [facility]. To find out your arrival time, please call [phone] between 1PM - 3PM on: Thursday June 06, 2022. If your arrival time is 6:00 am, do not arrive before that time as the [facility] entrance doors do not open until 6:00 am.  REMEMBER: Instructions that are not followed completely may result in serious medical risk, up to and including death; or upon the discretion of your surgeon and anesthesiologist your surgery may need to be rescheduled.  Do not eat food or drink fluids after midnight the night before surgery.  No gum chewing or hard candies.   One week prior to surgery: Stop Anti-inflammatories (NSAIDS) such as Advil, Aleve, Ibuprofen, Motrin, Naproxen, Naprosyn and Aspirin based products such as Excedrin, Goody's Powder, BC Powder. Stop ANY OVER THE COUNTER supplements until after surgery. You may however, continue to take Tylenol if needed for pain up until the day of surgery.  Continue taking all prescribed medications with the exception of the following: Stop metFORMIN (GLUCOPHAGE) 1000 MG 2 days prior to your surgery (take last dose Tuesday [id number]) Stop TRULICITY 4.5 0000000 SOPN 1 week prior to surgery (DO NOT Take your Trulicity the week of your procedure).  Follow recommendations from Cardiologist or PCP regarding stopping blood thinners.  TAKE ONLY THESE MEDICATIONS THE MORNING OF SURGERY WITH A SIP OF WATER:  pantoprazole (PROTONIX) 40 MG Antacid (take one the night before and one on the morning of surgery - helps to prevent nausea after surgery.) clonazePAM (KLONOPIN) 0.5 MG if needed    Use inhalers on the day of surgery and bring to the hospital. albuterol (VENTOLIN HFA) 108 (90 Base) MCG/ACT inhaler  TRELEGY ELLIPTA 100-62.5-25 MCG/ACT AEPB    No Alcohol for 24 hours before or after surgery.  No Smoking including e-cigarettes for 24  hours before surgery.  No chewable tobacco products for at least 6 hours before surgery.  No nicotine patches on the day of surgery.  Do not use any "recreational" drugs for at least a week (preferably 2 weeks) before your surgery.  Please be advised that the combination of cocaine and anesthesia may have negative outcomes, up to and including death. If you test positive for cocaine, your surgery will be cancelled.  On the morning of surgery brush your teeth with toothpaste and water, you may rinse your mouth with mouthwash if you wish. Do not swallow any toothpaste or mouthwash.  Use CHG Soap or wipes as directed on instruction sheet.  Do not wear jewelry, make-up, hairpins, clips or nail polish. Remove all body piercings.   Do not wear lotions, powders, or perfumes.   Do not shave body hair from the neck down 48 hours before surgery.  Contact lenses, hearing aids and dentures may not be worn into surgery.  Do not bring valuables to the hospital. [facility] is not responsible for any missing/lost belongings or valuables.   Total Shoulder Arthroplasty:  use Benzoyl Peroxide 5% Gel as directed on instruction sheet.  Bring your C-PAP to the hospital in case you may have to spend the night.   Notify your doctor if there is any change in your medical condition (cold, fever, infection).  Wear comfortable clothing (specific to your surgery type) to the hospital.  After surgery, you can help prevent lung complications by doing breathing exercises.  Take deep breaths and cough every 1-2 hours. Your doctor  may order a device called an Incentive Spirometer to help you take deep breaths. When coughing or sneezing, hold a pillow firmly against your incision with both hands. This is called "splinting." Doing this helps protect your incision. It also decreases belly discomfort.  If you are being admitted to the hospital overnight, leave your suitcase in the car. After surgery it may be brought  to your room.  In case of increased patient census, it may be necessary for you, the patient, to continue your postoperative care in the Same Day Surgery department.  If you are being discharged the day of surgery, you will not be allowed to drive home. You will need a responsible individual to drive you home and stay with you for 24 hours after surgery.   If you are taking public transportation, you will need to have a responsible individual with you.  Please call the [facility] at [phone] if you have any questions about these instructions.  Surgery Visitation Policy:  Patients undergoing a surgery or procedure may have two family members or support persons with them as long as the person is not COVID-19 positive or experiencing its symptoms.   Inpatient Visitation:    Visiting hours are 7 a.m. to 8 p.m. Up to four visitors are allowed at one time in a patient room. The visitors may rotate out with other people during the day. One designated support person (adult) may remain overnight.  Due to an increase in RSV and influenza rates and associated hospitalizations, children ages 63 and under will not be able to visit patients in [facility]. Masks continue to be strongly recommended.

## 2022-06-04 ENCOUNTER — Encounter
Admission: RE | Admit: 2022-06-04 | Discharge: 2022-06-04 | Disposition: A | Discharge status: Home / Self Care | Payer: Medicare HMO | Source: Ambulatory Visit | Attending: Urology | Admitting: Urology

## 2022-06-04 DIAGNOSIS — E785 Hyperlipidemia, unspecified: Secondary | ICD-10-CM | POA: Diagnosis not present

## 2022-06-04 DIAGNOSIS — I700 Atherosclerosis of aorta: Secondary | ICD-10-CM

## 2022-06-04 DIAGNOSIS — I2584 Coronary atherosclerosis due to calcified coronary lesion: Secondary | ICD-10-CM | POA: Diagnosis not present

## 2022-06-04 DIAGNOSIS — Z01810 Encounter for preprocedural cardiovascular examination: Secondary | ICD-10-CM | POA: Insufficient documentation

## 2022-06-04 DIAGNOSIS — I2510 Atherosclerotic heart disease of native coronary artery without angina pectoris: Secondary | ICD-10-CM | POA: Diagnosis not present

## 2022-06-04 DIAGNOSIS — E1169 Type 2 diabetes mellitus with other specified complication: Secondary | ICD-10-CM | POA: Diagnosis not present

## 2022-06-04 DIAGNOSIS — Z01818 Encounter for other preprocedural examination: Secondary | ICD-10-CM | POA: Diagnosis not present

## 2022-06-05 ENCOUNTER — Other Ambulatory Visit: Payer: Self-pay | Admitting: Internal Medicine

## 2022-06-05 DIAGNOSIS — U071 COVID-19: Secondary | ICD-10-CM

## 2022-06-05 DIAGNOSIS — R0602 Shortness of breath: Secondary | ICD-10-CM

## 2022-06-05 NOTE — Telephone Encounter (Signed)
Requested Prescriptions  Pending Prescriptions Disp Refills   albuterol (VENTOLIN HFA) 108 (90 Base) MCG/ACT inhaler [Pharmacy Med Name: ALBUTEROL HFA (PROVENTIL) INH] 8 each 2    Sig: INHALE 1-2 PUFFS BY MOUTH EVERY 6 HOURS AS NEEDED FOR WHEEZE OR SHORTNESS OF BREATH     Pulmonology:  Beta Agonists 2 Passed - 06/05/2022  1:32 AM      Passed - Last BP in normal range    BP Readings from Last 1 Encounters:  05/22/22 126/74         Passed - Last Heart Rate in normal range    Pulse Readings from Last 1 Encounters:  05/22/22 83         Passed - Valid encounter within last 12 months    Recent Outpatient Visits           3 weeks ago Abnormal uterine bleeding   [facility] [place], [place] W, NP   4 weeks ago Type 2 diabetes mellitus with hyperglycemia, without long-term current use of insulin [facility])   [facility] [place], Virl Diamond, RPH-CPP   2 months ago Encounter for general adult medical examination with abnormal findings   [facility] [place], [place], NP   4 months ago Type 2 diabetes mellitus with other specified complication, without long-term current use of insulin [facility])   [place], [place] A, RPH-CPP   5 months ago Type 2 diabetes mellitus with other specified complication, without long-term current use of insulin [facility])   [place], RPH-CPP       Future Appointments             In 5 days Armando Reichert, MD [facility] [place]   In 1 week [place], [place]   In 3 months [place], NP [facility]

## 2022-06-07 ENCOUNTER — Encounter: Payer: Self-pay | Admitting: Urology

## 2022-06-07 ENCOUNTER — Ambulatory Visit: Payer: Medicare HMO | Admitting: Certified Registered"

## 2022-06-07 ENCOUNTER — Ambulatory Visit
Admission: RE | Admit: 2022-06-07 | Discharge: 2022-06-07 | Disposition: A | Discharge status: Home / Self Care | Payer: Medicare HMO | Source: Ambulatory Visit | Attending: Urology | Admitting: Urology

## 2022-06-07 ENCOUNTER — Encounter
Admission: RE | Disposition: A | Discharge status: Home / Self Care | Payer: Self-pay | Source: Ambulatory Visit | Attending: Urology

## 2022-06-07 ENCOUNTER — Ambulatory Visit: Payer: Medicare HMO | Admitting: Urgent Care

## 2022-06-07 ENCOUNTER — Ambulatory Visit: Payer: Medicare HMO

## 2022-06-07 ENCOUNTER — Other Ambulatory Visit: Payer: Self-pay

## 2022-06-07 DIAGNOSIS — C679 Malignant neoplasm of bladder, unspecified: Secondary | ICD-10-CM | POA: Diagnosis not present

## 2022-06-07 DIAGNOSIS — E785 Hyperlipidemia, unspecified: Secondary | ICD-10-CM | POA: Insufficient documentation

## 2022-06-07 DIAGNOSIS — Z8616 Personal history of COVID-19: Secondary | ICD-10-CM | POA: Insufficient documentation

## 2022-06-07 DIAGNOSIS — D494 Neoplasm of unspecified behavior of bladder: Secondary | ICD-10-CM | POA: Diagnosis not present

## 2022-06-07 DIAGNOSIS — R897 Abnormal histological findings in specimens from other organs, systems and tissues: Secondary | ICD-10-CM | POA: Insufficient documentation

## 2022-06-07 DIAGNOSIS — I2510 Atherosclerotic heart disease of native coronary artery without angina pectoris: Secondary | ICD-10-CM | POA: Insufficient documentation

## 2022-06-07 DIAGNOSIS — Z833 Family history of diabetes mellitus: Secondary | ICD-10-CM | POA: Insufficient documentation

## 2022-06-07 DIAGNOSIS — J449 Chronic obstructive pulmonary disease, unspecified: Secondary | ICD-10-CM | POA: Diagnosis not present

## 2022-06-07 DIAGNOSIS — J45909 Unspecified asthma, uncomplicated: Secondary | ICD-10-CM | POA: Diagnosis not present

## 2022-06-07 DIAGNOSIS — C678 Malignant neoplasm of overlapping sites of bladder: Secondary | ICD-10-CM | POA: Diagnosis not present

## 2022-06-07 DIAGNOSIS — R69 Illness, unspecified: Secondary | ICD-10-CM | POA: Diagnosis not present

## 2022-06-07 DIAGNOSIS — I10 Essential (primary) hypertension: Secondary | ICD-10-CM | POA: Insufficient documentation

## 2022-06-07 DIAGNOSIS — E119 Type 2 diabetes mellitus without complications: Secondary | ICD-10-CM | POA: Diagnosis not present

## 2022-06-07 DIAGNOSIS — F1290 Cannabis use, unspecified, uncomplicated: Secondary | ICD-10-CM | POA: Diagnosis not present

## 2022-06-07 DIAGNOSIS — G4730 Sleep apnea, unspecified: Secondary | ICD-10-CM | POA: Insufficient documentation

## 2022-06-07 DIAGNOSIS — F17210 Nicotine dependence, cigarettes, uncomplicated: Secondary | ICD-10-CM | POA: Insufficient documentation

## 2022-06-07 DIAGNOSIS — T7840XA Allergy, unspecified, initial encounter: Secondary | ICD-10-CM | POA: Diagnosis not present

## 2022-06-07 HISTORY — PX: TRANSURETHRAL RESECTION OF BLADDER TUMOR: SHX2575

## 2022-06-07 LAB — GLUCOSE, CAPILLARY
Glucose-Capillary: 202 mg/dL — ABNORMAL HIGH (ref 70–99)
Glucose-Capillary: 215 mg/dL — ABNORMAL HIGH (ref 70–99)

## 2022-06-07 SURGERY — TURBT (TRANSURETHRAL RESECTION OF BLADDER TUMOR)
Anesthesia: General | Site: Bladder

## 2022-06-07 MED ORDER — OXYBUTYNIN CHLORIDE ER 10 MG PO TB24: 10.0000 mg | ORAL_TABLET | Freq: Every day | ORAL | 0 refills | Status: DC

## 2022-06-07 MED ORDER — PHENYLEPHRINE HCL (PRESSORS) 10 MG/ML IV SOLN
INTRAVENOUS | Status: DC | PRN
  Administered 2022-06-07 (×2): 80 ug via INTRAVENOUS

## 2022-06-07 MED ORDER — PROPOFOL 10 MG/ML IV BOLUS
INTRAVENOUS | Status: AC
  Filled 2022-06-07: qty 20

## 2022-06-07 MED ORDER — ACETAMINOPHEN 10 MG/ML IV SOLN
INTRAVENOUS | Status: DC | PRN
  Administered 2022-06-07: 1000 mg via INTRAVENOUS

## 2022-06-07 MED ORDER — GENTAMICIN SULFATE 40 MG/ML IJ SOLN
5.0000 mg/kg | INTRAVENOUS | Status: AC
  Administered 2022-06-07: 310 mg via INTRAVENOUS
  Filled 2022-06-07: qty 7.75

## 2022-06-07 MED ORDER — MIDAZOLAM HCL 2 MG/2ML IJ SOLN
INTRAMUSCULAR | Status: AC
  Filled 2022-06-07: qty 2

## 2022-06-07 MED ORDER — DEXAMETHASONE SODIUM PHOSPHATE 10 MG/ML IJ SOLN
INTRAMUSCULAR | Status: DC | PRN
  Administered 2022-06-07: 10 mg via INTRAVENOUS

## 2022-06-07 MED ORDER — ONDANSETRON HCL 4 MG/2ML IJ SOLN: 4.0000 mg | Freq: Once | INTRAMUSCULAR | Status: DC | PRN

## 2022-06-07 MED ORDER — ORAL CARE MOUTH RINSE: 15.0000 mL | Freq: Once | OROMUCOSAL | Status: DC

## 2022-06-07 MED ORDER — FENTANYL CITRATE (PF) 100 MCG/2ML IJ SOLN
INTRAMUSCULAR | Status: AC
  Administered 2022-06-07: 25 ug via INTRAVENOUS
  Filled 2022-06-07: qty 2

## 2022-06-07 MED ORDER — PROPOFOL 10 MG/ML IV BOLUS
INTRAVENOUS | Status: DC | PRN
  Administered 2022-06-07: 140 mg via INTRAVENOUS

## 2022-06-07 MED ORDER — SUCCINYLCHOLINE CHLORIDE 200 MG/10ML IV SOSY
PREFILLED_SYRINGE | INTRAVENOUS | Status: AC
  Filled 2022-06-07: qty 10

## 2022-06-07 MED ORDER — FENTANYL CITRATE (PF) 100 MCG/2ML IJ SOLN
INTRAMUSCULAR | Status: AC
  Filled 2022-06-07: qty 2

## 2022-06-07 MED ORDER — LIDOCAINE HCL (CARDIAC) PF 100 MG/5ML IV SOSY
PREFILLED_SYRINGE | INTRAVENOUS | Status: DC | PRN
  Administered 2022-06-07: 80 mg via INTRAVENOUS

## 2022-06-07 MED ORDER — FENTANYL CITRATE (PF) 100 MCG/2ML IJ SOLN
INTRAMUSCULAR | Status: DC | PRN
  Administered 2022-06-07 (×4): 50 ug via INTRAVENOUS

## 2022-06-07 MED ORDER — CHLORHEXIDINE GLUCONATE 0.12 % MT SOLN: 15.0000 mL | Freq: Once | OROMUCOSAL | Status: DC

## 2022-06-07 MED ORDER — OXYCODONE-ACETAMINOPHEN 5-325 MG PO TABS: 1.0000 | ORAL_TABLET | Freq: Four times a day (QID) | ORAL | 0 refills | Status: AC | PRN

## 2022-06-07 MED ORDER — SODIUM CHLORIDE 0.9 % IR SOLN
Status: DC | PRN
  Administered 2022-06-07: 30000 mL via INTRAVESICAL

## 2022-06-07 MED ORDER — ONDANSETRON HCL 4 MG/2ML IJ SOLN
INTRAMUSCULAR | Status: DC | PRN
  Administered 2022-06-07: 4 mg via INTRAVENOUS

## 2022-06-07 MED ORDER — ROCURONIUM BROMIDE 100 MG/10ML IV SOLN
INTRAVENOUS | Status: DC | PRN
  Administered 2022-06-07: 20 mg via INTRAVENOUS
  Administered 2022-06-07: 30 mg via INTRAVENOUS

## 2022-06-07 MED ORDER — SODIUM CHLORIDE 0.9 % IV SOLN: INTRAVENOUS | Status: DC

## 2022-06-07 MED ORDER — FENTANYL CITRATE (PF) 100 MCG/2ML IJ SOLN
25.0000 ug | INTRAMUSCULAR | Status: DC | PRN
  Administered 2022-06-07: 25 ug via INTRAVENOUS

## 2022-06-07 MED ORDER — MIDAZOLAM HCL 2 MG/2ML IJ SOLN
INTRAMUSCULAR | Status: DC | PRN
  Administered 2022-06-07: 2 mg via INTRAVENOUS

## 2022-06-07 MED ORDER — SUGAMMADEX SODIUM 200 MG/2ML IV SOLN
INTRAVENOUS | Status: DC | PRN
  Administered 2022-06-07: 200 mg via INTRAVENOUS

## 2022-06-07 MED ORDER — ONDANSETRON HCL 4 MG/2ML IJ SOLN
INTRAMUSCULAR | Status: AC
  Filled 2022-06-07: qty 2

## 2022-06-07 MED ORDER — SUCCINYLCHOLINE CHLORIDE 200 MG/10ML IV SOSY
PREFILLED_SYRINGE | INTRAVENOUS | Status: DC | PRN
  Administered 2022-06-07: 80 mg via INTRAVENOUS

## 2022-06-07 MED ORDER — LIDOCAINE HCL (PF) 2 % IJ SOLN
INTRAMUSCULAR | Status: AC
  Filled 2022-06-07: qty 5

## 2022-06-07 MED ORDER — DEXAMETHASONE SODIUM PHOSPHATE 10 MG/ML IJ SOLN
INTRAMUSCULAR | Status: AC
  Filled 2022-06-07: qty 1

## 2022-06-07 MED ORDER — ACETAMINOPHEN 10 MG/ML IV SOLN
INTRAVENOUS | Status: AC
  Filled 2022-06-07: qty 100

## 2022-06-07 MED ORDER — LACTATED RINGERS IV SOLN: INTRAVENOUS | Status: DC | PRN

## 2022-06-07 SURGICAL SUPPLY — 34 items
BAG DRAIN SIEMENS DORNER NS (MISCELLANEOUS) ×1 IMPLANT
BAG DRN NS LF (MISCELLANEOUS) ×1
BAG DRN RND TRDRP ANRFLXCHMBR (UROLOGICAL SUPPLIES) ×2
BAG URINE DRAIN 2000ML AR STRL (UROLOGICAL SUPPLIES) ×1 IMPLANT
BRUSH SCRUB EZ  4% CHG (MISCELLANEOUS) ×1
BRUSH SCRUB EZ 4% CHG (MISCELLANEOUS) ×1 IMPLANT
CATH FOL 2WAY LX 18X30 (CATHETERS) ×1 IMPLANT
CATH FOL 2WAY LX 22X30 (CATHETERS) IMPLANT
DRAPE UTILITY 15X26 TOWEL STRL (DRAPES) ×1 IMPLANT
DRSG TELFA 3X4 N-ADH STERILE (GAUZE/BANDAGES/DRESSINGS) ×1 IMPLANT
ELECT LOOP 22F BIPOLAR SML (ELECTROSURGICAL)
ELECT REM PT RETURN 9FT ADLT (ELECTROSURGICAL)
ELECTRODE LOOP 22F BIPOLAR SML (ELECTROSURGICAL) IMPLANT
ELECTRODE REM PT RTRN 9FT ADLT (ELECTROSURGICAL) IMPLANT
GAUZE 4X4 16PLY ~~LOC~~+RFID DBL (SPONGE) ×2 IMPLANT
GLOVE SURG UNDER POLY LF SZ7.5 (GLOVE) ×1 IMPLANT
GOWN STRL REUS W/ TWL LRG LVL3 (GOWN DISPOSABLE) ×1 IMPLANT
GOWN STRL REUS W/ TWL XL LVL3 (GOWN DISPOSABLE) ×1 IMPLANT
GOWN STRL REUS W/TWL LRG LVL3 (GOWN DISPOSABLE) ×1
GOWN STRL REUS W/TWL XL LVL3 (GOWN DISPOSABLE) ×1
IV NS IRRIG 3000ML ARTHROMATIC (IV SOLUTION) ×2 IMPLANT
KIT TURNOVER CYSTO (KITS) ×1 IMPLANT
LOOP CUT BIPOLAR 24F LRG (ELECTROSURGICAL) IMPLANT
MANIFOLD NEPTUNE II (INSTRUMENTS) ×1 IMPLANT
NDL SAFETY ECLIP 18X1.5 (MISCELLANEOUS) ×1 IMPLANT
PACK CYSTO AR (MISCELLANEOUS) ×1 IMPLANT
PAD ARMBOARD 7.5X6 YLW CONV (MISCELLANEOUS) ×1 IMPLANT
SET IRRIG Y TYPE TUR BLADDER L (SET/KITS/TRAYS/PACK) ×1 IMPLANT
SURGILUBE 2OZ TUBE FLIPTOP (MISCELLANEOUS) ×1 IMPLANT
SYR TOOMEY IRRIG 70ML (MISCELLANEOUS) ×1
SYRINGE TOOMEY IRRIG 70ML (MISCELLANEOUS) ×1 IMPLANT
TRAP FLUID SMOKE EVACUATOR (MISCELLANEOUS) ×1 IMPLANT
WATER STERILE IRR 1000ML POUR (IV SOLUTION) ×1 IMPLANT
WATER STERILE IRR 500ML POUR (IV SOLUTION) ×1 IMPLANT

## 2022-06-07 NOTE — Op Note (Signed)
Date of procedure: 06/07/22  Preoperative diagnosis:  Bladder tumor (>5cm)  Postoperative diagnosis:  Same  Procedure: TURBT large (>5cm), 22 modifier  Surgeon: Nickolas Madrid, MD  Anesthesia: General  Complications: None  Intraoperative findings:  Two large bladder tumors(5cm and 7cm), 1 circumferentially around the bladder neck, 2nd at the left lateral wall just adjacent to the left ureteral orifice All tumor resected entirely, ureteral orifices intact at conclusion of case, excellent hemostasis  EBL: 50 mL  Specimens: Bladder tumor  Drains: 22 French two-way Foley  Indication: Brandi Calderon is a 57 y.o. patient with gross hematuria and large bladder tumor on CT who opted for TURBT.  After reviewing the management options for treatment, they elected to proceed with the above surgical procedure(s). We have discussed the potential benefits and risks of the procedure, side effects of the proposed treatment, the likelihood of the patient achieving the goals of the procedure, and any potential problems that might occur during the procedure or recuperation. Informed consent has been obtained.  Description of procedure:  The patient was taken to the operating room and general anesthesia was induced. SCDs were placed for DVT prophylaxis. The patient was placed in the dorsal lithotomy position, prepped and draped in the usual sterile fashion, and preoperative antibiotics(gentamicin) were administered. A preoperative time-out was performed.   Bimanual exam was performed and there was no evidence of palpable tumor.  A 21 French rigid cystoscope was used to intubate the urethra, and the urethra was tight and there was tumor within the proximal third of the urethra.  Cystoscopy showed that the ureteral orifices were orthotopic bilaterally, the left ureteral orifice was immediately adjacent to a large papillary tumor at the left lateral wall that extended anteriorly.  There was also a very  large tumor that was circumferentially wrapped around the proximal third of the urethra and bladder neck from 8:00 through 1:00.  No other tumors were seen within the bladder.  The 26 French bipolar resectoscope was inserted into the bladder and large bipolar resecting loop connected.  I started with the tumor that was on the right side of the bladder and circumferential around the bladder neck and methodically resected down to the bladder wall.  This was extremely challenging secondary to the size and location of the tumor.  Tumor was resected within the proximal third of the urethra as well.  Once this tumor had been completely removed and hemostasis achieved, I turned my attention to the left-sided tumor that measured approximately 7 cm.  This was extremely close to the left ureteral orifice.  I methodically resected tumor down to the level of the bladder wall, and there was no evidence of bladder perforation.  Meticulous hemostasis was again achieved, and the left ureteral orifice was intact.  Total resection time 90 minutes.  All tumor chips were evacuated from the bladder and sent for pathology.  Meticulous hemostasis was achieved with the bipolar loop, and no bleeding was noted with the bladder decompressed, there was no abnormal appearing tissue remaining.  A 22 French Foley passed easily into the bladder and 15 mL were placed in the balloon.  Catheter was irrigated with 300 mL of sterile water and remained crystal-clear.  Catheter was connected to drainage.  Please note resection time was 90 minutes, typically 45 minutes longer than a standard large TURBT, and case was extremely challenging and took longer secondary to the size and location of the tumor circumferentially around the bladder neck and proximal urethra.  Modifier  22.  Disposition: Stable to PACU  Plan: Keep follow-up as scheduled on Thursday for Foley removal and discuss pathology  Nickolas Madrid, MD

## 2022-06-07 NOTE — Transfer of Care (Signed)
Immediate Anesthesia Transfer of Care Note  Patient: Brandi Calderon  Procedure(s) Performed: TRANSURETHRAL RESECTION OF BLADDER TUMOR (TURBT) (Bladder)  Patient Location: PACU  Anesthesia Type:General  Level of Consciousness: awake and alert   Airway & Oxygen Therapy: Patient Spontanous Breathing and Patient connected to nasal cannula oxygen  Post-op Assessment: Report given to RN and Post -op Vital signs reviewed and stable  Post vital signs: Reviewed and stable  Last Vitals:  Vitals Value Taken Time  BP 128/61 06/07/22 1146  Temp    Pulse 71 06/07/22 1148  Resp 20 06/07/22 1148  SpO2 100 % 06/07/22 1148  Vitals shown include unvalidated device data.  Last Pain:  Vitals:   06/07/22 0832  TempSrc: Oral  PainSc:       Patients Stated Pain Goal: 0 ([id number])  Complications: No notable events documented.

## 2022-06-07 NOTE — Anesthesia Postprocedure Evaluation (Signed)
Anesthesia Post Note  Patient: Brandi Calderon  Procedure(s) Performed: TRANSURETHRAL RESECTION OF BLADDER TUMOR (TURBT) (Bladder)  Patient location during evaluation: PACU Anesthesia Type: General Level of consciousness: awake and oriented Pain management: satisfactory to patient Vital Signs Assessment: post-procedure vital signs reviewed and stable Respiratory status: spontaneous breathing and respiratory function stable Cardiovascular status: stable Anesthetic complications: no   No notable events documented.   Last Vitals:  Vitals:   06/07/22 1152 06/07/22 1200  BP:  136/75  Pulse: 71 79  Resp: 18 18  Temp:    SpO2: 100% 100%    Last Pain:  Vitals:   06/07/22 1200  TempSrc:   PainSc: Asleep                 VAN STAVEREN,Elijan Googe

## 2022-06-07 NOTE — Interval H&P Note (Signed)
UROLOGY H&P UPDATE  Agree with prior H&P dated 05/22/2022.  57 year old female with gross hematuria found to have large 7 cm bladder tumor on CT urogram, no evidence of metastatic disease  Cardiac: RRR Lungs: CTA bilaterally  Laterality: N/A Procedure: Transurethral resection of bladder tumor  Urine: Culture 2/21 with E. coli, treated with culture appropriate nitrofurantoin  We discussed transurethral resection of bladder tumor (TURBT) and risks and benefits at length. This is typically a 1 to 2-hour procedure done under general anesthesia in the operating room.  A scope is inserted through the urethra and used to resect abnormal tissue within the bladder, which is then sent to the pathologist to determine grade and stage of the tumor.  Risks include bleeding, infection, need for temporary Foley placement, and bladder perforation.  Treatment strategies are based on the type of tumor and depth of invasion.  We briefly reviewed the different treatment pathways for non-muscle invasive and muscle invasive bladder cancer.  We reviewed extensively her large tumor with likely need for second look TURBT if found to have T1 disease, likely catheter duration of 1 to 2 weeks, and potential need for more invasive procedure like cystectomy or Trimodal therapy with radiation/chemotherapy if found to have T2 muscle invasive disease.  TURBT today  Billey Co, MD 06/07/2022

## 2022-06-07 NOTE — Discharge Instructions (Signed)
AMBULATORY SURGERY  ?DISCHARGE INSTRUCTIONS ? ? ?The drugs that you were given will stay in your system until tomorrow so for the next 24 hours you should not: ? ?Drive an automobile ?Make any legal decisions ?Drink any alcoholic beverage ? ? ?You may resume regular meals tomorrow.  Today it is better to start with liquids and gradually work up to solid foods. ? ?You may eat anything you prefer, but it is better to start with liquids, then soup and crackers, and gradually work up to solid foods. ? ? ?Please notify your doctor immediately if you have any unusual bleeding, trouble breathing, redness and pain at the surgery site, drainage, fever, or pain not relieved by medication. ? ? ? ?Additional Instructions: ? ? ? ?Please contact your physician with any problems or Same Day Surgery at [phone], Monday through Friday 6 am to 4 pm, or [facility] Main number at [phone].  ?

## 2022-06-07 NOTE — Anesthesia Procedure Notes (Signed)
Procedure Name: Intubation Date/Time: 06/07/2022 9:58 AM  Performed by: Fredderick Phenix, CRNAPre-anesthesia Checklist: Patient identified, Emergency Drugs available, Suction available and Patient being monitored Patient Re-evaluated:Patient Re-evaluated prior to induction Oxygen Delivery Method: Circle system utilized Preoxygenation: Pre-oxygenation with 100% oxygen Induction Type: IV induction Ventilation: Mask ventilation without difficulty Laryngoscope Size: McGraph and 3 Grade View: Grade I Tube type: Oral Tube size: 7.0 mm Number of attempts: 1 Airway Equipment and Method: Stylet and Oral airway Placement Confirmation: ETT inserted through vocal cords under direct vision, positive ETCO2 and breath sounds checked- equal and bilateral Secured at: 21 cm Tube secured with: Tape Dental Injury: Teeth and Oropharynx as per pre-operative assessment

## 2022-06-07 NOTE — Anesthesia Preprocedure Evaluation (Addendum)
Anesthesia Evaluation  Patient identified by MRN, date of birth, ID band Patient awake    Reviewed: Allergy & Precautions, NPO status , Patient's Chart, lab work & pertinent test results  Airway Mallampati: III  TM Distance: >3 FB Neck ROM: full    Dental  (+) Upper Dentures, Lower Dentures   Pulmonary neg pulmonary ROS, asthma , sleep apnea and Oxygen sleep apnea , COPD,  COPD inhaler, Current Smoker   Pulmonary exam normal  + decreased breath sounds      Cardiovascular Exercise Tolerance: Good hypertension, Pt. on medications + CAD  negative cardio ROS Normal cardiovascular exam Rhythm:Regular Rate:Normal     Neuro/Psych  Headaches  Anxiety     negative neurological ROS  negative psych ROS   GI/Hepatic negative GI ROS, Neg liver ROS,GERD  Medicated,,  Endo/Other  negative endocrine ROSdiabetes, Type 2, Oral Hypoglycemic Agents    Renal/GU      Musculoskeletal  (+) Arthritis ,    Abdominal Normal abdominal exam  (+)   Peds negative pediatric ROS (+)  Hematology negative hematology ROS (+)   Anesthesia Other Findings Past Medical History: No date: Anxiety No date: Arthritis No date: Asthma No date: Back pain No date: COPD (chronic obstructive pulmonary disease) (HCC) No date: Depression No date: Diabetes mellitus No date: Headache No date: History of COVID-19 No date: Hyperlipidemia No date: Hypertension No date: Neuromuscular disorder (HCC) No date: Sleep apnea No date: Tendonitis  Past Surgical History: No date: CESAREAN SECTION No date: CESAREAN SECTION     Comment:  3 times No date: COLONOSCOPY No date: cyst on neck     Comment:  as a baby/right side  BMI    Body Mass Index: 24.58 kg/m      Reproductive/Obstetrics negative OB ROS                             Anesthesia Physical Anesthesia Plan  ASA: 3  Anesthesia Plan: General ETT   Post-op Pain Management:     Induction:   PONV Risk Score and Plan: Ondansetron, Dexamethasone, Midazolam and Treatment may vary due to age or medical condition  Airway Management Planned: Oral ETT  Additional Equipment:   Intra-op Plan:   Post-operative Plan: Extubation in OR  Informed Consent: I have reviewed the patients History and Physical, chart, labs and discussed the procedure including the risks, benefits and alternatives for the proposed anesthesia with the patient or authorized representative who has indicated his/her understanding and acceptance.     Dental Advisory Given  Plan Discussed with: CRNA and Surgeon  Anesthesia Plan Comments:        Anesthesia Quick Evaluation

## 2022-06-08 ENCOUNTER — Encounter: Payer: Self-pay | Admitting: Urology

## 2022-06-10 ENCOUNTER — Encounter: Payer: Self-pay | Admitting: Student in an Organized Health Care Education/Training Program

## 2022-06-10 ENCOUNTER — Ambulatory Visit (INDEPENDENT_AMBULATORY_CARE_PROVIDER_SITE_OTHER): Payer: Medicare HMO | Admitting: Student in an Organized Health Care Education/Training Program

## 2022-06-10 ENCOUNTER — Ambulatory Visit: Payer: Medicare HMO | Admitting: Urology

## 2022-06-10 VITALS — BP 110/58 | HR 82 | Temp 99.2°F | Ht 61.0 in | Wt 134.0 lb

## 2022-06-10 DIAGNOSIS — F17210 Nicotine dependence, cigarettes, uncomplicated: Secondary | ICD-10-CM

## 2022-06-10 DIAGNOSIS — J449 Chronic obstructive pulmonary disease, unspecified: Secondary | ICD-10-CM | POA: Diagnosis not present

## 2022-06-10 DIAGNOSIS — R69 Illness, unspecified: Secondary | ICD-10-CM | POA: Diagnosis not present

## 2022-06-10 NOTE — Progress Notes (Deleted)
06/10/2022 2:48 PM   Brandi Calderon [birth year] [id number]  Referring provider: [name], NP [address] [place],  [place] [address]  Urological history: 1. Bladder tumors  No chief complaint on file.   HPI: Brandi Calderon is a 57 y.o. female who presents today for patient states she is having pain in her pelvic/back area, she is peeing out of her catheter, urine does not always go in the cath bag, she is soaking her depends. she is taking oxybutynin.           PMH: Past Medical History:  Diagnosis Date   Anxiety    Arthritis    Asthma    Back pain    COPD (chronic obstructive pulmonary disease) (HCC)    Depression    Diabetes mellitus    Headache    History of COVID-19    Hyperlipidemia    Hypertension    Neuromuscular disorder ([place])    Sleep apnea    Tendonitis     Surgical History: Past Surgical History:  Procedure Laterality Date   CESAREAN SECTION     CESAREAN SECTION     3 times   COLONOSCOPY     cyst on neck     as a baby/right side   TRANSURETHRAL RESECTION OF BLADDER TUMOR N/A 06/07/2022   Procedure: TRANSURETHRAL RESECTION OF BLADDER TUMOR (TURBT);  Surgeon: Billey Co, MD;  Location: [facility] ORS;  Service: Urology;  Laterality: N/A;    Home Medications:  Allergies as of 06/10/2022       Reactions   Penicillins Anaphylaxis   Has patient had a PCN reaction causing immediate rash, facial/tongue/throat swelling, SOB or lightheadedness with hypotension: Yes Has patient had a PCN reaction causing severe rash involving mucus membranes or skin necrosis: No Has patient had a PCN reaction that required hospitalization: No Has patient had a PCN reaction occurring within the last 10 years: No If all of the above answers are "NO", then may proceed with Cephalosporin use. Throat swells   Victoza [liraglutide]    Severe nausea   Vicodin [hydrocodone-acetaminophen] Rash   And hives        Medication List        Accurate as of  June 10, 2022  2:48 PM. If you have any questions, ask your nurse or doctor.          albuterol 108 (90 Base) MCG/ACT inhaler Commonly known as: VENTOLIN HFA INHALE 1-2 PUFFS BY MOUTH EVERY 6 HOURS AS NEEDED FOR WHEEZE OR SHORTNESS OF BREATH   atorvastatin 80 MG tablet Commonly known as: LIPITOR Take 1 tablet (80 mg total) by mouth once daily   BC HEADACHE POWDER PO Take 4 packets by mouth daily as needed (pain).   clonazePAM 0.5 MG tablet Commonly known as: KLONOPIN TAKE 1 TABLET BY MOUTH EVERY DAY AS NEEDED FOR ANXIETY   fluticasone 50 MCG/ACT nasal spray Commonly known as: FLONASE SPRAY 2 SPRAYS INTO EACH NOSTRIL EVERY DAY   furosemide 40 MG tablet Commonly known as: LASIX TAKE 1 TABLET BY MOUTH EVERY DAY   gabapentin 300 MG capsule Commonly known as: NEURONTIN Take 300 mg by mouth at bedtime.   glipiZIDE 5 MG 24 hr tablet Commonly known as: GLUCOTROL XL Take 5 mg by mouth daily.   glucose blood test strip Use as instructed   Jardiance 25 MG Tabs tablet Generic drug: empagliflozin Take 25 mg by mouth daily.   lisinopril 5 MG tablet Commonly known  as: ZESTRIL Take 1 tablet (5 mg total) by mouth once daily   metFORMIN 1000 MG tablet Commonly known as: GLUCOPHAGE TAKE 1 TABLET (1,000 MG TOTAL) BY MOUTH 2 (TWO) TIMES DAILY WITH A MEAL.   nitrofurantoin (macrocrystal-monohydrate) 100 MG capsule Commonly known as: MACROBID Take 1 capsule (100 mg total) by mouth every 12 (twelve) hours.   onetouch ultrasoft lancets Use as instructed   OneTouch Verio Flex System w/Device Kit Use to check blood sugar daily.  DX E11.9   oxybutynin 10 MG 24 hr tablet Commonly known as: Ditropan XL Take 1 tablet (10 mg total) by mouth daily for 10 days.   oxyCODONE-acetaminophen 5-325 MG tablet Commonly known as: Percocet Take 1 tablet by mouth every 6 (six) hours as needed for up to 3 days for severe pain.   OXYGEN Inhale 1 L into the lungs at bedtime.   pantoprazole  40 MG tablet Commonly known as: PROTONIX TAKE 1 TABLET BY MOUTH EVERY DAY What changed:  when to take this reasons to take this   Trelegy Ellipta 100-62.5-25 MCG/ACT Aepb Generic drug: Fluticasone-Umeclidin-Vilant Inhale 1 puff into the lungs daily.   Trulicity 4.5 0000000 Sopn Generic drug: Dulaglutide Inject 4.5 mg into the skin once a week.        Allergies:  Allergies  Allergen Reactions   Penicillins Anaphylaxis    Has patient had a PCN reaction causing immediate rash, facial/tongue/throat swelling, SOB or lightheadedness with hypotension: Yes Has patient had a PCN reaction causing severe rash involving mucus membranes or skin necrosis: No Has patient had a PCN reaction that required hospitalization: No Has patient had a PCN reaction occurring within the last 10 years: No If all of the above answers are "NO", then may proceed with Cephalosporin use.   Throat swells   Victoza [Liraglutide]     Severe nausea   Vicodin [Hydrocodone-Acetaminophen] Rash    And hives    Family History: Family History  Problem Relation Age of Onset   Diabetes Mother    Kidney disease Father    Diabetes Father    Dementia Father    Diabetes Sister    Diabetes Brother    Diabetes Brother    Colon cancer Neg Hx    Esophageal cancer Neg Hx    Stomach cancer Neg Hx    Rectal cancer Neg Hx     Social History:  reports that she has been smoking cigarettes. She has a 78.00 pack-year smoking history. She has been exposed to tobacco smoke. She has never used smokeless tobacco. She reports current drug use. Drug: Marijuana. She reports that she does not drink alcohol.  ROS: Pertinent ROS in HPI  Physical Exam: LMP 12/01/2017   Constitutional:  Well nourished. Alert and oriented, No acute distress. HEENT: [place] AT, moist mucus membranes.  Trachea midline, no masses. Cardiovascular: No clubbing, cyanosis, or edema. Respiratory: Normal respiratory effort, no increased work of  breathing. GI: Abdomen is soft, non tender, non distended, no abdominal masses. Liver and spleen not palpable.  No hernias appreciated.  Stool sample for occult testing is not indicated.   GU: No CVA tenderness.  No bladder fullness or masses.  Patient with circumcised/uncircumcised phallus. ***Foreskin easily retracted***  Urethral meatus is patent.  No penile discharge. No penile lesions or rashes. Scrotum without lesions, cysts, rashes and/or edema.  Testicles are located scrotally bilaterally. No masses are appreciated in the testicles. Left and right epididymis are normal. Rectal: Patient with  normal sphincter tone. Anus  and perineum without scarring or rashes. No rectal masses are appreciated. Prostate is approximately *** grams, *** nodules are appreciated. Seminal vesicles are normal. Skin: No rashes, bruises or suspicious lesions. Lymph: No cervical or inguinal adenopathy. Neurologic: Grossly intact, no focal deficits, moving all 4 extremities. Psychiatric: Normal mood and affect.  Laboratory Data: Lab Results  Component Value Date   WBC 10.5 03/14/2022   HGB 14.4 03/14/2022   HCT 42.9 03/14/2022   MCV 89.9 03/14/2022   PLT 294 03/14/2022    Lab Results  Component Value Date   CREATININE 0.70 05/22/2022    No results found for: "PSA"  No results found for: "TESTOSTERONE"  Lab Results  Component Value Date   HGBA1C 7.1 (H) 03/14/2022    Lab Results  Component Value Date   TSH 2.34 05/14/2022       Component Value Date/Time   CHOL 170 03/14/2022 1336   CHOL 120 12/24/2016 1827   HDL 44 (L) 03/14/2022 1336   HDL 29 (L) 12/24/2016 1827   CHOLHDL 3.9 03/14/2022 1336   LDLCALC 103 (H) 03/14/2022 1336    Lab Results  Component Value Date   AST 10 03/14/2022   Lab Results  Component Value Date   ALT 8 03/14/2022   No components found for: "ALKALINEPHOPHATASE" No components found for: "BILIRUBINTOTAL"  No results found for: "ESTRADIOL"  Urinalysis     Component Value Date/Time   COLORURINE YELLOW 05/22/2022 1026   APPEARANCEUR CLOUDY (A) 05/22/2022 1026   APPEARANCEUR Clear 09/11/2016 1131   LABSPEC >1.030 (H) 05/22/2022 1026   LABSPEC 1.033 12/07/2012 1833   PHURINE 6.0 05/22/2022 1026   GLUCOSEU NEGATIVE 05/22/2022 1026   GLUCOSEU >=500 12/07/2012 1833   HGBUR LARGE (A) 05/22/2022 1026   BILIRUBINUR NEGATIVE 05/22/2022 1026   BILIRUBINUR negative 09/07/2019 1102   BILIRUBINUR Negative 09/11/2016 1131   BILIRUBINUR Negative 12/07/2012 1833   KETONESUR NEGATIVE 05/22/2022 1026   PROTEINUR 100 (A) 05/22/2022 1026   UROBILINOGEN 0.2 09/07/2019 1102   UROBILINOGEN 1.0 04/16/2014 1541   NITRITE POSITIVE (A) 05/22/2022 1026   LEUKOCYTESUR SMALL (A) 05/22/2022 1026   LEUKOCYTESUR Trace 12/07/2012 1833    I have reviewed the labs.   Pertinent Imaging: '@CT'$ @ '@ultrasound'$ @ '@KUB'$ @ I have independently reviewed the films.    Assessment & Plan:  ***  There are no diagnoses linked to this encounter.  No follow-ups on file.  These notes generated with voice recognition software. I apologize for typographical errors.  [place], [place] [address]  [place], [place] [address] [phone]

## 2022-06-10 NOTE — Progress Notes (Signed)
Assessment & Plan:   #COPD #Nicotine dependence, smoking #Pre-operative risk assessment   Has a history of COPD for which she is maintained on Trelegy (LAMA/LABA/ICS). Symptoms have been stable and she is compliant with her inhalers. Exam today is benign. She will continue on her Trelegy as prescribed and follow up with her pulmonologist Dr. Mortimer Fries in 6 months.  I had seen her in October of 2023 for pre-procedural risk stratification. She was optimized at the time and remains so today should she require surgery/resection for her bladder tumor. Aside from her status as a current smoker, the patient is optimized from a pulmonary/COPD perspective. She is low risk for post procedural pulmonary complications/failure (3 points for age/1.6% per ARISCAT; Arozullah index not validated for females).   Patient counseled extensively re smoking cessation. I had provided her with prescriptions for nicotine replacement therapy during her last visit.  Return in about 6 months (around 12/11/2022) with Dr. Mortimer Fries.  I spent 25 minutes caring for this patient today, including preparing to see the patient, obtaining a medical history , reviewing a separately obtained history, performing a medically appropriate examination and/or evaluation, counseling and educating the patient/family/caregiver, and documenting clinical information in the electronic health record  Armando Reichert, MD [facility] 06/10/2022 1:44 PM    End of visit medications:  No orders of the defined types were placed in this encounter.    Current Outpatient Medications:    albuterol (VENTOLIN HFA) 108 (90 Base) MCG/ACT inhaler, INHALE 1-2 PUFFS BY MOUTH EVERY 6 HOURS AS NEEDED FOR WHEEZE OR SHORTNESS OF BREATH, Disp: 8 each, Rfl: 2   Aspirin-Salicylamide-Caffeine (BC HEADACHE POWDER PO), Take 4 packets by mouth daily as needed (pain)., Disp: , Rfl:    atorvastatin (LIPITOR) 80 MG tablet, Take 1 tablet (80 mg total) by  mouth once daily, Disp: , Rfl:    Blood Glucose Monitoring Suppl (ONETOUCH VERIO FLEX SYSTEM) w/Device KIT, Use to check blood sugar daily.  DX E11.9, Disp: 1 kit, Rfl: 0   clonazePAM (KLONOPIN) 0.5 MG tablet, TAKE 1 TABLET BY MOUTH EVERY DAY AS NEEDED FOR ANXIETY, Disp: 30 tablet, Rfl: 0   fluticasone (FLONASE) 50 MCG/ACT nasal spray, SPRAY 2 SPRAYS INTO EACH NOSTRIL EVERY DAY, Disp: 48 mL, Rfl: 1   furosemide (LASIX) 40 MG tablet, TAKE 1 TABLET BY MOUTH EVERY DAY, Disp: 90 tablet, Rfl: 0   gabapentin (NEURONTIN) 300 MG capsule, Take 300 mg by mouth at bedtime., Disp: , Rfl:    glipiZIDE (GLUCOTROL XL) 5 MG 24 hr tablet, Take 5 mg by mouth daily., Disp: , Rfl:    glucose blood test strip, Use as instructed, Disp: 100 each, Rfl: 0   JARDIANCE 25 MG TABS tablet, Take 25 mg by mouth daily., Disp: , Rfl:    Lancets (ONETOUCH ULTRASOFT) lancets, Use as instructed, Disp: 100 each, Rfl: 12   lisinopril (ZESTRIL) 5 MG tablet, Take 1 tablet (5 mg total) by mouth once daily, Disp: , Rfl:    metFORMIN (GLUCOPHAGE) 1000 MG tablet, TAKE 1 TABLET (1,000 MG TOTAL) BY MOUTH 2 (TWO) TIMES DAILY WITH A MEAL., Disp: 60 tablet, Rfl: 0   nitrofurantoin, macrocrystal-monohydrate, (MACROBID) 100 MG capsule, Take 1 capsule (100 mg total) by mouth every 12 (twelve) hours., Disp: 14 capsule, Rfl: 0   oxybutynin (DITROPAN XL) 10 MG 24 hr tablet, Take 1 tablet (10 mg total) by mouth daily for 10 days., Disp: 10 tablet, Rfl: 0   oxyCODONE-acetaminophen (PERCOCET) 5-325 MG tablet,  Take 1 tablet by mouth every 6 (six) hours as needed for up to 3 days for severe pain., Disp: 8 tablet, Rfl: 0   OXYGEN, Inhale 1 L into the lungs at bedtime., Disp: , Rfl:    pantoprazole (PROTONIX) 40 MG tablet, TAKE 1 TABLET BY MOUTH EVERY DAY (Patient taking differently: Take 40 mg by mouth daily as needed (Heartburn).), Disp: 90 tablet, Rfl: 1   TRELEGY ELLIPTA 100-62.5-25 MCG/ACT AEPB, Inhale 1 puff into the lungs daily., Disp: 1 each, Rfl: 2    TRULICITY 4.5 0000000 SOPN, Inject 4.5 mg into the skin once a week., Disp: , Rfl:    Subjective:   PATIENT ID: Brandi Calderon GENDER: female DOB: February 23, 1966, MRN: [id number]  Chief Complaint  Patient presents with   Follow-up    C/o cough with white sputum.     HPI  Ms. Crumb is a pleasant 57 year old female patient of Dr. Mortimer Fries with a history of COPD who presents to clinic for follow up. There was a confusion in scheduling and she was scheduled in my clinic. I had seen her for urgent pre-operative risk stratification and optimization in October prior to a routine colonoscopy.  Patient is yet to have her colonoscopy. She did develop hematuria and underwent cystoscopy on Friday with Dr. Diamantina Providence. She was found to have two large bladder tumors that were resected. She has a foley catheter in and is complaining of pain. She is to go upstairs to see her urology team after today's visit.  Patient unfortunately continues to smoke. She is now smoking a pack a day. She has exertional shortness of breath and a "smoker's" cough. The cough is non-productive. The exertional dyspnea is stable and has been present for over a year now. She noticed it after she had COVID. She had been seen by Dr. Mortimer Fries with PFT's showing obstructive lung disease and she was started on Trelegy. She is compliant with Trelegy and reports using albuterol 2-3 times daily in addition to it. She continues to use nocturnal oxygen. She never got her CPAP machine and is not using it.   She continues to smoke 1 pack a day and has a 50 pack year smoking history. She also reports smoking marijuana (flower rolled in the form of joints with no filter). She denies vaping but has considered it.  Ancillary information including prior medications, full medical/surgical/family/social histories, and PFTs (when available) are listed below and have been reviewed.   Review of Systems  Constitutional:  Negative for chills and fever.   Respiratory:  Positive for cough and shortness of breath. Negative for hemoptysis, sputum production and wheezing.   Cardiovascular:  Negative for chest pain, palpitations, orthopnea and PND.     Objective:   Vitals:   06/10/22 1328  BP: (!) 110/58  Pulse: 82  Temp: 99.2 F (37.3 C)  TempSrc: Oral  SpO2: 98%  Weight: 134 lb (60.8 kg)  Height: '5\' 1"'$  (1.549 m)   98% on RA  BMI Readings from Last 3 Encounters:  06/10/22 25.32 kg/m  06/07/22 24.58 kg/m  06/03/22 24.56 kg/m   Wt Readings from Last 3 Encounters:  06/10/22 134 lb (60.8 kg)  06/07/22 130 lb 1.1 oz (59 kg)  06/03/22 130 lb (59 kg)    Physical Exam Constitutional:      Appearance: Normal appearance. She is normal weight.  HENT:     Head: Atraumatic.     Mouth/Throat:     Mouth: Mucous membranes are moist.  Eyes:     Extraocular Movements: Extraocular movements intact.  Cardiovascular:     Rate and Rhythm: Normal rate and regular rhythm.     Heart sounds: Normal heart sounds.  Pulmonary:     Effort: Pulmonary effort is normal.     Breath sounds: Normal breath sounds.  Musculoskeletal:     Cervical back: Normal range of motion.  Neurological:     General: No focal deficit present.     Mental Status: She is alert and oriented to person, place, and time.  Psychiatric:        Mood and Affect: Mood normal.       Ancillary Information    Past Medical History:  Diagnosis Date   Anxiety    Arthritis    Asthma    Back pain    COPD (chronic obstructive pulmonary disease) ([place])    Depression    Diabetes mellitus    Headache    History of COVID-19    Hyperlipidemia    Hypertension    Neuromuscular disorder ([place])    Sleep apnea    Tendonitis      Family History  Problem Relation Age of Onset   Diabetes Mother    Kidney disease Father    Diabetes Father    Dementia Father    Diabetes Sister    Diabetes Brother    Diabetes Brother    Colon cancer Neg Hx    Esophageal cancer Neg Hx     Stomach cancer Neg Hx    Rectal cancer Neg Hx      Past Surgical History:  Procedure Laterality Date   CESAREAN SECTION     CESAREAN SECTION     3 times   COLONOSCOPY     cyst on neck     as a baby/right side   TRANSURETHRAL RESECTION OF BLADDER TUMOR N/A 06/07/2022   Procedure: TRANSURETHRAL RESECTION OF BLADDER TUMOR (TURBT);  Surgeon: Billey Co, MD;  Location: [facility] ORS;  Service: Urology;  Laterality: N/A;    Social History   Socioeconomic History   Marital status: Divorced    Spouse name: Not on file   Number of children: Not on file   Years of education: Not on file   Highest education level: Not on file  Occupational History   Not on file  Tobacco Use   Smoking status: Every Day    Packs/day: 2.00    Years: 39.00    Total pack years: 78.00    Types: Cigarettes    Passive exposure: Current   Smokeless tobacco: Never   Tobacco comments:    1PPD 06/10/2022  Vaping Use   Vaping Use: Never used  Substance and Sexual Activity   Alcohol use: No   Drug use: Yes    Types: Marijuana    Comment: States she smokes MJ when she does not have any Tramadol.   Sexual activity: Not on file  Other Topics Concern   Not on file  Social History Narrative   ** Merged History Encounter ** In the process of getting SSD for depression, DM and neuropathy      Active smoker [~ 2p/day; cutting down]; no alcohol; lives in [place]; self. Used to work in [employer]    Social Determinants of Health   Financial Resource Strain: Low Risk  (02/01/2022)   Overall Financial Resource Strain (CARDIA)    Difficulty of Paying Living Expenses: Not very hard  Food Insecurity: No Food Insecurity (04/18/2022)   Hunger  Vital Sign    Worried About Charity fundraiser in the Last Year: Never true    Ran Out of Food in the Last Year: Never true  Transportation Needs: No Transportation Needs (04/18/2022)   PRAPARE - Hydrologist (Medical): No    Lack of Transportation  (Non-Medical): No  Physical Activity: Insufficiently Active (02/01/2022)   Exercise Vital Sign    Days of Exercise per Week: 2 days    Minutes of Exercise per Session: 60 min  Stress: No Stress Concern Present (02/01/2022)   [place]    Feeling of Stress : Only a little  Social Connections: Socially Isolated (02/01/2022)   Social Connection and Isolation Panel [NHANES]    Frequency of Communication with Friends and Family: More than three times a week    Frequency of Social Gatherings with Friends and Family: More than three times a week    Attends Religious Services: Never    Marine scientist or Organizations: No    Attends Archivist Meetings: Never    Marital Status: Divorced  Human resources officer Violence: Not At Risk (02/01/2022)   Humiliation, Afraid, Rape, and Kick questionnaire    Fear of Current or Ex-Partner: No    Emotionally Abused: No    Physically Abused: No    Sexually Abused: No     Allergies  Allergen Reactions   Penicillins Anaphylaxis    Has patient had a PCN reaction causing immediate rash, facial/tongue/throat swelling, SOB or lightheadedness with hypotension: Yes Has patient had a PCN reaction causing severe rash involving mucus membranes or skin necrosis: No Has patient had a PCN reaction that required hospitalization: No Has patient had a PCN reaction occurring within the last 10 years: No If all of the above answers are "NO", then may proceed with Cephalosporin use.   Throat swells   Victoza [Liraglutide]     Severe nausea   Vicodin [Hydrocodone-Acetaminophen] Rash    And hives     CBC    Component Value Date/Time   WBC 10.5 03/14/2022 1336   RBC 4.77 03/14/2022 1336   HGB 14.4 03/14/2022 1336   HGB 12.8 06/25/2017 1027   HCT 42.9 03/14/2022 1336   HCT 38.3 06/25/2017 1027   PLT 294 03/14/2022 1336   PLT 348 06/25/2017 1027   MCV 89.9 03/14/2022 1336   MCV 90  06/25/2017 1027   MCV 80 12/07/2012 1809   MCH 30.2 03/14/2022 1336   MCHC 33.6 03/14/2022 1336   RDW 13.2 03/14/2022 1336   RDW 15.0 06/25/2017 1027   RDW 17.8 (H) 12/07/2012 1809   LYMPHSABS 3.3 09/11/2020 1448   LYMPHSABS 3.3 (H) 09/11/2016 1131   MONOABS 0.5 09/11/2020 1448   EOSABS 0.3 09/11/2020 1448   EOSABS 0.2 09/11/2016 1131   BASOSABS 0.1 09/11/2020 1448   BASOSABS 0.0 09/11/2016 1131    Pulmonary Functions Testing Results:     No data to display          Outpatient Medications Prior to Visit  Medication Sig Dispense Refill   albuterol (VENTOLIN HFA) 108 (90 Base) MCG/ACT inhaler INHALE 1-2 PUFFS BY MOUTH EVERY 6 HOURS AS NEEDED FOR WHEEZE OR SHORTNESS OF BREATH 8 each 2   Aspirin-Salicylamide-Caffeine (BC HEADACHE POWDER PO) Take 4 packets by mouth daily as needed (pain).     atorvastatin (LIPITOR) 80 MG tablet Take 1 tablet (80 mg total) by mouth once daily  Blood Glucose Monitoring Suppl (ONETOUCH VERIO FLEX SYSTEM) w/Device KIT Use to check blood sugar daily.  DX E11.9 1 kit 0   clonazePAM (KLONOPIN) 0.5 MG tablet TAKE 1 TABLET BY MOUTH EVERY DAY AS NEEDED FOR ANXIETY 30 tablet 0   fluticasone (FLONASE) 50 MCG/ACT nasal spray SPRAY 2 SPRAYS INTO EACH NOSTRIL EVERY DAY 48 mL 1   furosemide (LASIX) 40 MG tablet TAKE 1 TABLET BY MOUTH EVERY DAY 90 tablet 0   gabapentin (NEURONTIN) 300 MG capsule Take 300 mg by mouth at bedtime.     glipiZIDE (GLUCOTROL XL) 5 MG 24 hr tablet Take 5 mg by mouth daily.     glucose blood test strip Use as instructed 100 each 0   JARDIANCE 25 MG TABS tablet Take 25 mg by mouth daily.     Lancets (ONETOUCH ULTRASOFT) lancets Use as instructed 100 each 12   lisinopril (ZESTRIL) 5 MG tablet Take 1 tablet (5 mg total) by mouth once daily     metFORMIN (GLUCOPHAGE) 1000 MG tablet TAKE 1 TABLET (1,000 MG TOTAL) BY MOUTH 2 (TWO) TIMES DAILY WITH A MEAL. 60 tablet 0   nitrofurantoin, macrocrystal-monohydrate, (MACROBID) 100 MG capsule Take 1  capsule (100 mg total) by mouth every 12 (twelve) hours. 14 capsule 0   oxybutynin (DITROPAN XL) 10 MG 24 hr tablet Take 1 tablet (10 mg total) by mouth daily for 10 days. 10 tablet 0   oxyCODONE-acetaminophen (PERCOCET) 5-325 MG tablet Take 1 tablet by mouth every 6 (six) hours as needed for up to 3 days for severe pain. 8 tablet 0   OXYGEN Inhale 1 L into the lungs at bedtime.     pantoprazole (PROTONIX) 40 MG tablet TAKE 1 TABLET BY MOUTH EVERY DAY (Patient taking differently: Take 40 mg by mouth daily as needed (Heartburn).) 90 tablet 1   TRELEGY ELLIPTA 100-62.5-25 MCG/ACT AEPB Inhale 1 puff into the lungs daily. 1 each 2   TRULICITY 4.5 0000000 SOPN Inject 4.5 mg into the skin once a week.     No facility-administered medications prior to visit.

## 2022-06-10 NOTE — Progress Notes (Signed)
States she has leaking from the catheter insertion and is very uncomfortable. I suggested she call the office to see if they feel like she needs to be seen before her scheduled appt on Thursday.

## 2022-06-11 LAB — SURGICAL PATHOLOGY

## 2022-06-12 ENCOUNTER — Encounter: Payer: Self-pay | Admitting: Emergency Medicine

## 2022-06-12 ENCOUNTER — Telehealth: Payer: Self-pay | Admitting: Internal Medicine

## 2022-06-12 ENCOUNTER — Encounter: Payer: Self-pay | Admitting: Urology

## 2022-06-12 ENCOUNTER — Other Ambulatory Visit: Payer: Self-pay

## 2022-06-12 ENCOUNTER — Emergency Department: Payer: Medicare HMO

## 2022-06-12 ENCOUNTER — Emergency Department
Admission: EM | Admit: 2022-06-12 | Discharge: 2022-06-12 | Disposition: A | Discharge status: Home / Self Care | Payer: Medicare HMO | Attending: Emergency Medicine | Admitting: Emergency Medicine

## 2022-06-12 ENCOUNTER — Ambulatory Visit (INDEPENDENT_AMBULATORY_CARE_PROVIDER_SITE_OTHER): Payer: Medicare HMO | Admitting: Urology

## 2022-06-12 ENCOUNTER — Other Ambulatory Visit: Payer: Self-pay | Admitting: Urology

## 2022-06-12 VITALS — BP 108/69 | HR 73 | Ht 61.0 in | Wt 130.0 lb

## 2022-06-12 DIAGNOSIS — T83511A Infection and inflammatory reaction due to indwelling urethral catheter, initial encounter: Secondary | ICD-10-CM | POA: Diagnosis not present

## 2022-06-12 DIAGNOSIS — E119 Type 2 diabetes mellitus without complications: Secondary | ICD-10-CM | POA: Insufficient documentation

## 2022-06-12 DIAGNOSIS — I10 Essential (primary) hypertension: Secondary | ICD-10-CM | POA: Insufficient documentation

## 2022-06-12 DIAGNOSIS — N390 Urinary tract infection, site not specified: Secondary | ICD-10-CM

## 2022-06-12 DIAGNOSIS — D494 Neoplasm of unspecified behavior of bladder: Secondary | ICD-10-CM

## 2022-06-12 DIAGNOSIS — N3289 Other specified disorders of bladder: Secondary | ICD-10-CM | POA: Diagnosis not present

## 2022-06-12 DIAGNOSIS — R102 Pelvic and perineal pain: Secondary | ICD-10-CM

## 2022-06-12 DIAGNOSIS — Z8616 Personal history of COVID-19: Secondary | ICD-10-CM | POA: Insufficient documentation

## 2022-06-12 DIAGNOSIS — I2510 Atherosclerotic heart disease of native coronary artery without angina pectoris: Secondary | ICD-10-CM | POA: Diagnosis not present

## 2022-06-12 DIAGNOSIS — Y732 Prosthetic and other implants, materials and accessory gastroenterology and urology devices associated with adverse incidents: Secondary | ICD-10-CM | POA: Insufficient documentation

## 2022-06-12 DIAGNOSIS — J449 Chronic obstructive pulmonary disease, unspecified: Secondary | ICD-10-CM | POA: Insufficient documentation

## 2022-06-12 DIAGNOSIS — R1031 Right lower quadrant pain: Secondary | ICD-10-CM | POA: Diagnosis not present

## 2022-06-12 LAB — CBC WITH DIFFERENTIAL/PLATELET
Abs Immature Granulocytes: 0.05 10*3/uL (ref 0.00–0.07)
Basophils Absolute: 0 10*3/uL (ref 0.0–0.1)
Basophils Relative: 0 %
Eosinophils Absolute: 0.2 10*3/uL (ref 0.0–0.5)
Eosinophils Relative: 2 %
HCT: 31.9 % — ABNORMAL LOW (ref 36.0–46.0)
Hemoglobin: 10.3 g/dL — ABNORMAL LOW (ref 12.0–15.0)
Immature Granulocytes: 0 %
Lymphocytes Relative: 11 %
Lymphs Abs: 1.3 10*3/uL (ref 0.7–4.0)
MCH: 29.1 pg (ref 26.0–34.0)
MCHC: 32.3 g/dL (ref 30.0–36.0)
MCV: 90.1 fL (ref 80.0–100.0)
Monocytes Absolute: 0.8 10*3/uL (ref 0.1–1.0)
Monocytes Relative: 7 %
Neutro Abs: 10 10*3/uL — ABNORMAL HIGH (ref 1.7–7.7)
Neutrophils Relative %: 80 %
Platelets: 277 10*3/uL (ref 150–400)
RBC: 3.54 MIL/uL — ABNORMAL LOW (ref 3.87–5.11)
RDW: 15.3 % (ref 11.5–15.5)
WBC: 12.4 10*3/uL — ABNORMAL HIGH (ref 4.0–10.5)
nRBC: 0 % (ref 0.0–0.2)

## 2022-06-12 LAB — COMPREHENSIVE METABOLIC PANEL
ALT: 12 U/L (ref 0–44)
AST: 11 U/L — ABNORMAL LOW (ref 15–41)
Albumin: 3.5 g/dL (ref 3.5–5.0)
Alkaline Phosphatase: 99 U/L (ref 38–126)
Anion gap: 7 (ref 5–15)
BUN: 12 mg/dL (ref 6–20)
CO2: 27 mmol/L (ref 22–32)
Calcium: 9 mg/dL (ref 8.9–10.3)
Chloride: 104 mmol/L (ref 98–111)
Creatinine, Ser: 0.74 mg/dL (ref 0.44–1.00)
GFR, Estimated: >60 mL/min (ref >60)
Glucose, Bld: 211 mg/dL — ABNORMAL HIGH (ref 70–99)
Potassium: 4.2 mmol/L (ref 3.5–5.1)
Sodium: 138 mmol/L (ref 135–145)
Total Bilirubin: 0.6 mg/dL (ref 0.3–1.2)
Total Protein: 7.1 g/dL (ref 6.5–8.1)

## 2022-06-12 LAB — URINALYSIS, ROUTINE W REFLEX MICROSCOPIC
Bilirubin Urine: NEGATIVE
Glucose, UA: >=500 mg/dL — AB
Ketones, ur: NEGATIVE mg/dL
Nitrite: NEGATIVE
Protein, ur: >=300 mg/dL — AB
RBC / HPF: >50 RBC/hpf (ref 0–5)
Specific Gravity, Urine: 1.026 (ref 1.005–1.030)
WBC, UA: >50 WBC/hpf (ref 0–5)
pH: 6 (ref 5.0–8.0)

## 2022-06-12 LAB — LIPASE, BLOOD: Lipase: 30 U/L (ref 11–51)

## 2022-06-12 MED ORDER — SULFAMETHOXAZOLE-TRIMETHOPRIM 800-160 MG PO TABS
1.0000 | ORAL_TABLET | Freq: Once | ORAL | Status: AC
  Administered 2022-06-12: 1 via ORAL

## 2022-06-12 MED ORDER — CIPROFLOXACIN HCL 500 MG PO TABS
500.0000 mg | ORAL_TABLET | Freq: Once | ORAL | Status: AC
  Administered 2022-06-12: 500 mg via ORAL
  Filled 2022-06-12: qty 1

## 2022-06-12 MED ORDER — HYDROMORPHONE HCL 1 MG/ML IJ SOLN
0.5000 mg | Freq: Once | INTRAMUSCULAR | Status: DC
  Filled 2022-06-12: qty 0.5

## 2022-06-12 MED ORDER — SULFAMETHOXAZOLE-TRIMETHOPRIM 800-160 MG PO TABS: 1.0000 | ORAL_TABLET | Freq: Two times a day (BID) | ORAL | 0 refills | Status: DC

## 2022-06-12 MED ORDER — OXYBUTYNIN CHLORIDE ER 10 MG PO TB24: 10.0000 mg | ORAL_TABLET | Freq: Every day | ORAL | 1 refills | Status: DC

## 2022-06-12 MED ORDER — OXYCODONE HCL 5 MG PO TABS: 5.0000 mg | ORAL_TABLET | Freq: Three times a day (TID) | ORAL | 0 refills | Status: AC | PRN

## 2022-06-12 MED ORDER — CIPROFLOXACIN HCL 500 MG PO TABS: 500.0000 mg | ORAL_TABLET | Freq: Two times a day (BID) | ORAL | 0 refills | Status: DC

## 2022-06-12 MED ORDER — IOHEXOL 300 MG/ML  SOLN
100.0000 mL | Freq: Once | INTRAMUSCULAR | Status: AC | PRN
  Administered 2022-06-12: 100 mL via INTRAVENOUS

## 2022-06-12 MED ORDER — LACTATED RINGERS IV BOLUS
1000.0000 mL | Freq: Once | INTRAVENOUS | Status: AC
  Administered 2022-06-12: 1000 mL via INTRAVENOUS

## 2022-06-12 MED ORDER — POLYETHYLENE GLYCOL 3350 17 G PO PACK: 17.0000 g | PACK | Freq: Every day | ORAL | 0 refills | Status: AC

## 2022-06-12 MED ORDER — HYDROMORPHONE HCL 1 MG/ML IJ SOLN
0.5000 mg | Freq: Once | INTRAMUSCULAR | Status: AC
  Administered 2022-06-12: 0.5 mg via INTRAVENOUS
  Filled 2022-06-12: qty 0.5

## 2022-06-12 NOTE — Discharge Instructions (Addendum)
Please continue to drink lots of fluid.  Please take the antibiotic twice a day for the next 7 days.  Please follow-up with Dr. Doristine Counter office later this afternoon if you have worsening pain.  Please keep your appointment with Dr. Diamantina Providence tomorrow.  You can take the oxycodone as needed for pain.  Please also take Tylenol and Motrin.  Please start taking the MiraLAX.

## 2022-06-12 NOTE — Progress Notes (Signed)
   06/12/2022 4:12 PM   M[facility]ta [facility] [birth year] [phone]  Reason for visit: Bladder pain, discuss TURBT results  HPI: 57 year old female who presented with a large 7 cm + bladder tumor and underwent uncomplicated TURBT on 09/01/8464 with resection of all visible tumor.  She apparently presented to the ER this morning with lower abdominal pain, bladder spasms, and leakage around the catheter.  Urine was clear yellow.  She was originally scheduled for Foley removal and pathology discussion tomorrow, but her appointment was moved up today after being seen in the ER this morning.    I personally viewed and interpreted the CT abdomen and pelvis from this morning that shows no evidence of fluid collection in the pelvis, no hydronephrosis, thickened bladder, no evidence of urine extravasation.  Urine today is clear yellow in the catheter.  It sounds like she has had problems with bladder spasms and pelvic pain over the weekend with a catheter in.  We discussed her extensive resection, and likely overactive symptoms.  Foley was removed today, Bactrim was given for prophylaxis, and I recommended a 5-day course of Bactrim based on urinalysis results from ER this morning, and will follow-up urine culture.  We also reviewed her pathology today showing HG T1 urothelial cell carcinoma with no evidence of muscle invasion.  We reviewed the AUA guidelines regarding treatment strategies including a second look TURBT in approximately 6 weeks, followed by BCG treatments and.  We discussed high risk of recurrence, and risk for progression.  Smoking cessation discussed at length.  Bactrim x 5 days, follow-up urine culture Continue oxybutynin 10 mg XL in the setting of extensive resection Return precautions discussed Will schedule second look TURBT in 6 weeks, anticipate BCG treatments in the future  Billey Co, [place] [address] [place], [place]  [address] [phone]

## 2022-06-12 NOTE — ED Notes (Signed)
Bladder scan 146

## 2022-06-12 NOTE — Progress Notes (Signed)
Patient ID: Brandi Calderon, female   DOB: 11/11/1965, 58 y.o.   MRN: [id number] Catheter Removal  Patient is present today for a catheter removal.  39m of water was drained from the balloon. A 22FR foley cath was removed from the bladder, no complications were noted. Patient tolerated well.  Performed by: DEdwin Dada CMA  Follow up/ Additional notes:  mLenna Sciarawill call to schedule 2nd look TURBT in ~6 weeks

## 2022-06-12 NOTE — Telephone Encounter (Signed)
patients daughter Brandi Calderon came in the lobby and said patient was at the hospital this morning but is currently home, She said she is a lot of pain, The hospital sent her home with an antibiotic and pain med. She has a catheter. She has an appointment tomorrow but said she really needs to be seen today. Is their anyone that can see her today? Please advise. Call back is [phone] Brandi Calderon(Patients other Daughter who is with patient currently)

## 2022-06-12 NOTE — Telephone Encounter (Signed)
Patient scheduled for today at 3:15pm

## 2022-06-12 NOTE — Patient Instructions (Signed)
Bladder Cancer  Bladder cancer is a condition where abnormal tissue (a tumor) grows in the bladder. The bladder is the organ that holds urine. Two tubes (ureters) carry urine from the kidneys to the bladder. The bladder wall is made of layers of tissue. Cancer that spreads through these layers of the bladder wall becomes more difficult to treat. What increases the risk? The following factors may make you more likely to develop this condition: Smoking. Working where there are risks (occupational exposures), such as working with Engineer, structural, Brewing technologist, clothing fabric, dyes, chemicals, or paint. Being 55 years of age or older. Being female. Having long-term bladder inflammation. Having a history of cancer. This includes: A family history of bladder cancer. Having had bladder cancer before. Having had certain treatments for cancer before, such as: Medicines to kill cancer cells (chemotherapy). Strong X-ray beams or high-energy capsules to kill cancer cells and shrink tumors (radiation therapy). Having been exposed to arsenic. This is a poisonous substance. What are the signs or symptoms? Early symptoms of this condition include: Blood in your urine. Pain when urinating. Infections of your urinary system (urinary tract infections or UTIs) that happen often. Having to urinate sooner or more often than normal. Late symptoms of this condition include: Not being able to urinate. Pain on one side of your lower back. Loss of appetite. Weight loss. Tiredness (fatigue). Swelling in your feet. Bone pain. How is this diagnosed? This condition is diagnosed based on: Your medical history. A physical exam. Lab tests, such as urine tests. Imaging tests. Your symptoms. You may also have other tests or procedures, such as: A cystoscopy. This involves putting a narrow tube into your urethra. The urethra is the organ that carries urine from your bladder to the outside of your body. This procedure is done to  view the lining of your bladder for tumors. A biopsy. This involves removing a tissue sample to look at under a microscope to check for cancer. Blood tests or imaging tests may be needed. These show how far into the bladder wall cancer has grown, and if cancer has spread to any other parts of your body. Tests may include: CT scan. MRI. Bone scan. X-ray. How is this treated? Your health care provider may recommend one or more types of treatment based on the stage of your cancer. The most common treatments are: Surgery to remove the cancer. Types of surgeries include: Removing a tumor on the inside wall of the bladder (transurethral resection). Removing the bladder (cystectomy). Radiation therapy. This is often combined with chemotherapy. Chemotherapy. Immunotherapy. This uses medicines to help your body's disease-fighting system (immune system) destroy cancer cells. Follow these instructions at home: Take over-the-counter and prescription medicines only as told by your health care provider. If you were prescribed an antibiotic medicine, take it as told by your health care provider. Do not stop using the antibiotic even if you start to feel better. Eat a healthy diet. Some treatments might affect your appetite. Do not use any products that contain nicotine or tobacco. These products include cigarettes, chewing tobacco, and vaping devices, such as e-cigarettes. If you need help quitting, ask your health care provider. Consider joining a support group. This may help you learn to deal with the stress of having bladder cancer. Tell your cancer care team if you develop side effects. Your team may be able to recommend ways to get relief. Keep all follow-up visits. This is important. Where to find more information American Cancer Society (ACS): cancer.org National  [place]): cancer.gov Contact a health care provider if: You have symptoms of a UTI. These  include: Fever. Chills. Weakness. Muscle aches. Pain in your abdomen. Urge to urinate that is stronger and happens more often than normal. Burning in the bladder or urethra when you urinate. Get help right away if: There is blood in your urine. You cannot urinate. You have severe pain or other symptoms that do not go away. Summary Bladder cancer is a condition where tumors grow in the bladder. Diagnosis is based on your medical history, a physical exam, lab tests, imaging tests, and your symptoms. Your health care provider may recommend one or more types of treatment based on the stage of your cancer. Consider joining a support group. This may help you learn to deal with the stress of having bladder cancer. This information is not intended to replace advice given to you by your health care provider. Make sure you discuss any questions you have with your health care provider. Document Revised: 02/26/2021 Document Reviewed: 02/26/2021 Elsevier Patient Education  [place] Calmette-Guerin Live, BCG intravesical solution What is this medication? BACILLUS CALMETTE-GUERIN LIVE, BCG (ba SIL [place]  KAL met  gay RAYN) is a bacteria solution. This medicine stimulates the immune system to ward off cancer cells. It is used to treat bladder cancer. This medicine may be used for other purposes; ask your health care provider or pharmacist if you have questions. This medicine may be used for other purposes; ask your health care provider or pharmacist if you have questions. COMMON BRAND NAME(S): Theracys, TICE BCG What should I tell my care team before I take this medication? They need to know if you have any of these conditions: aneurysm blood in the urine bladder biopsy within 2 weeks fever or infection immune system problems leukemia lymphoma myasthenia gravis need organ transplant prosthetic device like arterial graft, artificial joint, prosthetic heart valve recent or  ongoing radiation therapy tuberculosis an unusual or allergic reaction to Bacillus Calmette-Guerin Live, BCG, latex, other medicines, foods, dyes, or preservatives pregnant or trying to get pregnant breast-feeding How should I use this medication? This drug is given as a catheter infusion into the bladder. It is administered in a hospital or clinic by a specially trained health care provider. You will be given directions to follow before the treatment. Follow your health care provider's directions carefully. This medicine contains live bacteria. It is very important to follow these directions closely after treatment to prevent others from coming in contact with your urine. Your health care provider may give you additional directions to follow. Try to hold this medicine in your bladder for 1 to 2 hours. Follow these directions the first time you go to the bathroom and for 6 hours after the first void. Wash your hands before using the restroom. After voiding, wash your hands and genital area. Use a toilet and sit when going to the bathroom. This helps to prevent the urine from splashing. Do not use public toilets or void outside. After each void, add 2 cups of undiluted bleach to the toilet. Close the lid. Wait 15 to 20 minutes and then flush the toilet. After the first void, drink more fluids to help dilute your urine. If you have urinary incontinence, wash the clothes you were wearing in the washer immediately. Do not wash other clothes at the same time. If you are wearing an incontinence pad, pour bleach on the pad and allow it to soak into the  pad before throwing it away. Put the pad in a plastic bag and put it in the trash. Talk to your pediatrician regarding the use of this medicine in children. Special care may be needed. Overdosage: If you think you have taken too much of this medicine contact a poison control center or emergency room at once. NOTE: This medicine is only for you. Do not share  this medicine with others. Overdosage: If you think you have taken too much of this medicine contact a poison control center or emergency room at once. NOTE: This medicine is only for you. Do not share this medicine with others. What if I miss a dose? It is important not to miss your dose. Call your doctor or health care professional if you are unable to keep an appointment. What may interact with this medication? antibiotics medicines to suppress your immune system like chemotherapy agents or corticosteroids medicine to treat tuberculosis This list may not describe all possible interactions. Give your health care provider a list of all the medicines, herbs, non-prescription drugs, or dietary supplements you use. Also tell them if you smoke, drink alcohol, or use illegal drugs. Some items may interact with your medicine. This list may not describe all possible interactions. Give your health care provider a list of all the medicines, herbs, non-prescription drugs, or dietary supplements you use. Also tell them if you smoke, drink alcohol, or use illegal drugs. Some items may interact with your medicine. What should I watch for while using this medication? Visit your health care provider for checks on your progress. This medicine may make you feel generally unwell. Contact your health care provider if your symptoms last more than 2 days or if they get worse. Call your health care provider right away if you have a severe or unusual symptom. Infection can be spread to others through contact with this medicine. To prevent the spread of infection, follow your health care provider's directions carefully after treatment. Do not become pregnant while taking this medicine. There is a potential for serious side effects to an unborn child. Talk to your health care provider for more information. Do not breast-feed an infant while taking this medicine. If you have sex while on this medicine, use a condom. Ask your  health care provider how long you should use a condom. What side effects may I notice from receiving this medication? Side effects that you should report to your doctor or health care professional as soon as possible: allergic reactions like skin rash, itching or hives, swelling of the face, lips, or tongue signs of infection - fever or chills, cough, sore throat, pain or difficulty passing urine signs of decreased red blood cells - unusually weak or tired, fainting spells, lightheadedness blood in urine breathing problems cough eye pain, redness flu-like symptoms joint pain bladder-area pain for more than 2 days after treatment trouble passing urine or change in the amount of urine vomiting yellowing of the eyes or skin Side effects that usually do not require medical attention (report to your doctor or health care professional if they continue or are bothersome): bladder spasm burning when passing urine within 2 days of treatment feel need to pass urine often or wake up at night to pass urine loss of appetite This list may not describe all possible side effects. Call your doctor for medical advice about side effects. You may report side effects to FDA at 1-800-FDA-1088. This list may not describe all possible side effects. Call your doctor for  medical advice about side effects. You may report side effects to FDA at 1-800-FDA-1088. Where should I keep my medication? This drug is given in a hospital or clinic and will not be stored at home. NOTE: This sheet is a summary. It may not cover all possible information. If you have questions about this medicine, talk to your doctor, pharmacist, or health care provider.  2023 Elsevier/Gold Standard (2018-04-10 00:00:00)

## 2022-06-12 NOTE — ED Provider Notes (Signed)
[facility] Provider Note    Event Date/Time   First MD Initiated Contact with Patient 06/12/22 [phone]     (approximate)   History   Post-op Problem   HPI  Brandi Calderon is a 57 y.o. female past medical history of COPD, hypertension, hyperlipidemia, recent TURP on 3/8 who presents with abdominal pain and leakage around the Foley catheter.  Patient had TURP on 3/8 with Dr. Jeb Levering.  Was doing okay until Sunday, 3 days ago when she started feeling like the Foley catheter was not draining as much and she now has sensation that she needs to urinate and there is urine leaking around the catheter.  She is also had lower primarily right-sided abdominal pain.  Has not had BM in 4 days which is not typical for her.  Was taking oxycodone for 3 days after but is no longer taking any.  Denies fevers has had some chills no back or flank pain denies vomiting.     Past Medical History:  Diagnosis Date   Anxiety    Arthritis    Asthma    Back pain    COPD (chronic obstructive pulmonary disease) (HCC)    Depression    Diabetes mellitus    Headache    History of COVID-19    Hyperlipidemia    Hypertension    Neuromuscular disorder ([place])    Sleep apnea    Tendonitis     Patient Active Problem List   Diagnosis Date Noted   Aortic atherosclerosis ([place]) 05/21/2022   Coronary artery disease due to calcified coronary lesion 05/21/2022   Overweight with body mass index (BMI) of 25 to 25.9 in adult 03/14/2022   Menopausal symptoms 11/27/2021   Neutrophilia 08/30/2020   Mixed simple and mucopurulent chronic bronchitis ([place]) 07/26/2020   Gastroesophageal reflux disease without esophagitis 07/26/2020   Hyperlipidemia associated with type 2 diabetes mellitus ([place]) 05/24/2020   Anxiety and depression 07/23/2018   Chronic pain syndrome 07/23/2018   Neuropathy due to type 2 diabetes mellitus ([place]) - severe 12/19/2015   Type 2 diabetes mellitus ([place]) 05/24/2015      Physical Exam  Triage Vital Signs: ED Triage Vitals  Enc Vitals Group     BP 06/12/22 0430 125/64     Pulse Rate 06/12/22 0430 81     Resp 06/12/22 0430 19     Temp 06/12/22 0430 98.3 F (36.8 C)     Temp Source 06/12/22 0430 Oral     SpO2 06/12/22 0430 100 %     Weight 06/12/22 0431 130 lb (59 kg)     Height 06/12/22 0431 '5\' 1"'$  (1.549 m)     Head Circumference --      Peak Flow --      Pain Score 06/12/22 0431 10     Pain Loc --      Pain Edu? --      Excl. in [place]? --     Most recent vital signs: Vitals:   06/12/22 0430  BP: 125/64  Pulse: 81  Resp: 19  Temp: 98.3 F (36.8 C)  SpO2: 100%     General: Awake, no distress.  CV:  Good peripheral perfusion.  Resp:  Normal effort. Abd:  No distention.  Neuro:             Awake, Alert, Oriented x 3  Other:  Foley catheter bag is draining dark yellow urine.   ED Results / Procedures / Treatments  Labs (all  labs ordered are listed, but only abnormal results are displayed) Labs Reviewed  COMPREHENSIVE METABOLIC PANEL - Abnormal; Notable for the following components:      Result Value   Glucose, Bld 211 (*)    AST 11 (*)    All other components within normal limits  CBC WITH DIFFERENTIAL/PLATELET - Abnormal; Notable for the following components:   WBC 12.4 (*)    RBC 3.54 (*)    Hemoglobin 10.3 (*)    HCT 31.9 (*)    Neutro Abs 10.0 (*)    All other components within normal limits  URINALYSIS, ROUTINE W REFLEX MICROSCOPIC - Abnormal; Notable for the following components:   Color, Urine AMBER (*)    APPearance CLOUDY (*)    Glucose, UA >=500 (*)    Hgb urine dipstick LARGE (*)    Protein, ur >=300 (*)    Leukocytes,Ua SMALL (*)    Bacteria, UA MANY (*)    All other components within normal limits  URINE CULTURE  LIPASE, BLOOD     EKG     RADIOLOGY I reviewed and interpreted the CT of the abdomen pelvis which shows edematous urinary bladder   PROCEDURES:  Critical Care performed:  No  Procedures  The patient is on the cardiac monitor to evaluate for evidence of arrhythmia and/or significant heart rate changes.   MEDICATIONS ORDERED IN ED: Medications  lactated ringers bolus 1,000 mL (0 mLs Intravenous Stopped 06/12/22 0713)  HYDROmorphone (DILAUDID) injection 0.5 mg (0.5 mg Intravenous Given 06/12/22 0531)  iohexol (OMNIPAQUE) 300 MG/ML solution 100 mL (100 mLs Intravenous Contrast Given 06/12/22 0618)  ciprofloxacin (CIPRO) tablet 500 mg (500 mg Oral Given 06/12/22 [id number])     IMPRESSION / MDM / ASSESSMENT AND PLAN / ED COURSE  I reviewed the triage vital signs and the nursing notes.                              Patient's presentation is most consistent with acute complicated illness / injury requiring diagnostic workup.  Differential diagnosis includes, but is not limited to, obstructed Foley catheter, bladder spasm, bladder perforation, bowel obstruction, constipation, appendicitis, diverticulitis  Patient is a 57 year old female with recent resection of bladder masses with indwelling Foley catheter presents because of abdominal pain and leakage around Foley.  Patient had a TURP on 3/8, was doing okay until several days ago started having lower abdominal pain sensation she needed to urinate and leakage around the Foley.  No vomiting has had some chills is also having constipation.  Patient's vitals are reassuring.  She is somewhat tearful on my evaluation looks mildly uncomfortable.  Foley catheter is draining but just a small amount of dark yellow urine.  Says she has not changed since 6 PM yesterday which would have been about 12 hours now.  She has a benign abdominal exam mild tenderness in the right lower abdomen.  Will need a bladder scan to ensure that Foley catheter is draining and from her symptoms and suspicious that it is not fully functioning.  Will check labs including renal function will likely need CT imaging.  Will give a bolus of fluid and Dilaudid for  pain.  Patient CT of the abdomen pelvis shows edematous bladder that somewhat distended but no other acute process no extravasation of urine.  Discussed with Dr. Bernardo Heater with urology about whether to flush the Foley versus remove it and he said that we could try some  gentle irrigation but with bladder spasm may be difficult.  He said with only a little over 100 cc in the bladder he would not do anything differently.  I attempted to irrigate with sterile water but patient was quite uncomfortable with instillation of just a small amount of water and was difficult to pull back.  Did not attempt further irrigation.  Repeat bladder scan shows 140 cc in the bladder.  UA sent from bag does show greater than 50 white cells and with the edematous inflamed appearance of her bladder on CT I do think she likely has cystitis associated with the catheter.  She has history of anaphylaxis to penicillin so we will discharge with Cipro.  I have prescribed short course of oxycodone as well as MiraLAX as patient is already having constipation.  Discussed the importance of keeping her appointment with Dr. Diamantina Providence tomorrow and that she should call the office this afternoon if things are worsening versus return to the ER.  FINAL CLINICAL IMPRESSION(S) / ED DIAGNOSES   Final diagnoses:  Urinary tract infection associated with indwelling urethral catheter, initial encounter ([place])     Rx / DC Orders   ED Discharge Orders          Ordered    ciprofloxacin (CIPRO) 500 MG tablet  2 times daily        06/12/22 0710    oxyCODONE (ROXICODONE) 5 MG immediate release tablet  Every 8 hours PRN        06/12/22 0731    polyethylene glycol (MIRALAX) 17 g packet  Daily        06/12/22 0731             Note:  This document was prepared using Dragon voice recognition software and may include unintentional dictation errors.   Rada Hay, MD 06/12/22 [phone]

## 2022-06-12 NOTE — Progress Notes (Signed)
Surgical Physician Order Form [facility] [place]  * Scheduling expectation :  6 weeks from 06/12/2022  *Length of Case: 1 hour  *Clearance needed: no  *Anticoagulation Instructions: Hold all anticoagulants  *Aspirin Instructions: Hold Aspirin  *Post-op visit Date/Instructions:  1-2 week with pathology review  *Diagnosis: Bladder Tumor  *Procedure:     Cysto Bladder Biopsy [id number])   Additional orders: Urology  -Admit type: OUTpatient  -Anesthesia: General  -VTE Prophylaxis Standing Order SCD's       Other:   -Standing Lab Orders Per Anesthesia    Lab other: UA&Urine Culture  -Standing Test orders EKG/Chest x-ray per Anesthesia       Test other:   - Medications:  Gentamicin per pharmacy  -Other orders:  N/A

## 2022-06-12 NOTE — ED Triage Notes (Signed)
Pt to triage via w/c, appears uncomfortable, tearful; TURBT performed on Friday; foley due to be removed on Thursday; pt st since Sunday having rt lower abd pain and leaking around cath

## 2022-06-12 NOTE — ED Notes (Signed)
Bladder scan > 165 mL

## 2022-06-13 ENCOUNTER — Ambulatory Visit: Payer: Medicare HMO | Admitting: Urology

## 2022-06-13 ENCOUNTER — Other Ambulatory Visit: Payer: Self-pay | Admitting: Internal Medicine

## 2022-06-13 DIAGNOSIS — K219 Gastro-esophageal reflux disease without esophagitis: Secondary | ICD-10-CM

## 2022-06-13 NOTE — Telephone Encounter (Signed)
Requested Prescriptions  Pending Prescriptions Disp Refills   pantoprazole (PROTONIX) 40 MG tablet [Pharmacy Med Name: PANTOPRAZOLE SOD DR 40 MG TAB] 90 tablet 0    Sig: Take 1 tablet (40 mg total) by mouth daily as needed (Heartburn).     Gastroenterology: Proton Pump Inhibitors Passed - 06/13/2022  1:55 AM      Passed - Valid encounter within last 12 months    Recent Outpatient Visits           1 month ago Abnormal uterine bleeding   [facility] [place], [place] W, NP   1 month ago Type 2 diabetes mellitus with hyperglycemia, without long-term current use of insulin [facility])   [facility] [place], Virl Diamond, RPH-CPP   3 months ago Encounter for general adult medical examination with abnormal findings   [facility] [place], [place] W, NP   4 months ago Type 2 diabetes mellitus with other specified complication, without long-term current use of insulin [facility])   [place], Grayland Ormond A, RPH-CPP   5 months ago Type 2 diabetes mellitus with other specified complication, without long-term current use of insulin [facility])   [place], RPH-CPP       Future Appointments             In 3 months [name], [name], NP [facility], [facility]

## 2022-06-14 ENCOUNTER — Telehealth: Payer: Self-pay

## 2022-06-14 ENCOUNTER — Other Ambulatory Visit: Payer: Self-pay | Admitting: Internal Medicine

## 2022-06-14 LAB — URINE CULTURE: Culture: 60000 — AB

## 2022-06-14 NOTE — Telephone Encounter (Signed)
I spoke with Brandi Calderon We have discussed possible surgery dates and Friday April 26th, 2024 was agreed upon by all parties. Patient given information about surgery date, what to expect pre-operatively and post operatively.  We discussed that a Pre-Admission Testing office will be calling to set up the pre-op visit that will take place prior to surgery, and that these appointments are typically done over the phone with a Pre-Admissions RN. Informed patient that our office will communicate any additional care to be provided after surgery. Patients questions or concerns were discussed during our call. Advised to call our office should there be any additional information, questions or concerns that arise. Patient verbalized understanding.

## 2022-06-14 NOTE — Telephone Encounter (Signed)
Requested Prescriptions  Pending Prescriptions Disp Refills   fluticasone (FLONASE) 50 MCG/ACT nasal spray [Pharmacy Med Name: FLUTICASONE PROP 50 MCG SPRAY] 48 mL 0    Sig: SPRAY 2 SPRAYS INTO EACH NOSTRIL EVERY DAY     Ear, Nose, and Throat: Nasal Preparations - Corticosteroids Passed - 06/14/2022  1:41 AM      Passed - Valid encounter within last 12 months    Recent Outpatient Visits           1 month ago Abnormal uterine bleeding   [facility] [place], [place] W, NP   1 month ago Type 2 diabetes mellitus with hyperglycemia, without long-term current use of insulin [facility])   [facility] D[place], Grayland Ormond A, RPH-CPP   3 months ago Encounter for general adult medical examination with abnormal findings   [facility] [place], [place] W, NP   4 months ago Type 2 diabetes mellitus with other specified complication, without long-term current use of insulin [facility])   [place], Grayland Ormond A, RPH-CPP   5 months ago Type 2 diabetes mellitus with other specified complication, without long-term current use of insulin ([place])   [facility] Delles, Virl Diamond, RPH-CPP       Future Appointments             In 3 months [name], [name], NP [facility], [facility]

## 2022-06-14 NOTE — Progress Notes (Signed)
   [facility] From  Surgery Date: Date: 07/26/2022  Surgeon: Dr. 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

## 2022-06-14 NOTE — Transitions of Care (Post Inpatient/ED Visit) (Signed)
   06/14/2022  Name: Brandi Calderon MRN: [id number] DOB: [birth year]  Today's TOC FU Call Status: Today's TOC FU Call Status:: Successful TOC FU Call Competed TOC FU Call Complete Date: 06/14/22  Transition Care Management Follow-up Telephone Call Date of Discharge: 06/12/22 Discharge Facility: [facility] Type of Discharge: Emergency Department Reason for ED Visit: Other: ("UTI") How have you been since you were released from the hospital?: Better (Patient reports that the "burning with urination is getting better." She is taking pain meds and they are helping. She is currently on abx therapy. Urine is slightly orange in color due to current med she is taking.) Any questions or concerns?: No  Red on EMMI-ED Discharge Alert Date & Reason:"Scheduled follow-up appt? No" Confirmed with patient that she completed follow up appt with urologist.  Items Reviewed: Did you receive and understand the discharge instructions provided?: Yes Medications obtained and verified?: Yes (Medications Reviewed) Any new allergies since your discharge?: No Dietary orders reviewed?: NA Do you have support at home?: Yes People in Home: child(ren), adult Name of Support/Comfort Primary Source: [place] and Equipment/Supplies: [place] Ordered?: NA Any new equipment or medical supplies ordered?: NA  Functional Questionnaire: Do you need assistance with bathing/showering or dressing?: No Do you need assistance with meal preparation?: No Do you need assistance with eating?: No Do you have difficulty maintaining continence: No Do you need assistance with getting out of bed/getting out of a chair/moving?: No Do you have difficulty managing or taking your medications?: No  Folllow up appointments reviewed: PCP Follow-up appointment confirmed?: NA Specialist Hospital Follow-up appointment confirmed?: Yes Date of Specialist follow-up appointment?:  06/12/22 Follow-Up Specialty Provider:: Dr. Eben Burow Do you need transportation to your follow-up appointment?: No (Dtr accompanies her to all appts) Do you understand care options if your condition(s) worsen?: Yes-patient verbalized understanding  SDOH Interventions Today    Flowsheet Row Most Recent Value  SDOH Interventions   Food Insecurity Interventions Intervention Not Indicated  Transportation Interventions Intervention Not Indicated      TOC Interventions Today    Flowsheet Row Most Recent Value  TOC Interventions   TOC Interventions Discussed/Reviewed TOC Interventions Discussed, S/S of infection      Interventions Today    Flowsheet Row Most Recent Value  Education Interventions   Education Provided Provided Education  [bladder mgmt, bowel regimen, pain mgmt]  Provided Verbal Education On Nutrition, When to see the doctor, Other, Medication  Nutrition Interventions   Nutrition Discussed/Reviewed Nutrition Discussed, Adding fruits and vegetables  Pharmacy Interventions   Pharmacy Dicussed/Reviewed Medications and their functions, Pharmacy Topics Discussed  Safety Interventions   Safety Discussed/Reviewed Safety Discussed       Hetty Blend [facility] Care Management Care Management Community Coordinator Direct Phone: [phone] Toll Free: [phone] Fax: [phone]

## 2022-06-17 ENCOUNTER — Ambulatory Visit: Payer: Self-pay | Admitting: *Deleted

## 2022-06-17 NOTE — Patient Instructions (Signed)
Visit Information  Thank you for taking time to visit with me today. Please don't hesitate to contact me if I can be of assistance to you before our next scheduled telephone appointment.  Following are the goals we discussed today:  Keep appointments for preadmission testing.  Call urology if have bleeding, pain, and signs of UTI.  Our next appointment is by telephone on 4/30  Please call the care guide team at [phone] if you need to cancel or reschedule your appointment.   Please call the Suicide and Crisis Lifeline: 988 call the [place] National Suicide Prevention Lifeline: (747)176-5857 or TTY: (984) 684-9678 TTY (919)839-7372) to talk to a trained counselor call 1-800-273-TALK (toll free, 24 hour hotline) call 911 if you are experiencing a Mental Health or [place] or need someone to talk to.  Patient verbalizes understanding of instructions and care plan provided today and agrees to view in [place]. Active MyChart status and patient understanding of how to access instructions and care plan via MyChart confirmed with patient.     The patient has been provided with contact information for the care management team and has been advised to call with any health related questions or concerns.   [place] Management Care Management Coordinator [phone]

## 2022-06-17 NOTE — Patient Outreach (Signed)
  Care Coordination   Follow Up Visit Note   06/17/2022 Name: Brandi Calderon MRN: [id number] DOB: [birth year]  Brandi Calderon is a 57 y.o. year old female who sees [place], [name], NP for primary care. I spoke with  [name] by phone today.  What matters to the patients health and wellness today?  Report initial surgery was a success, all cancer was removed but she will go back to follow up with cystoscopy and biopsy.  Catheter was removed on 3/13, has not reported any complications since. Denies any urgent concerns, encouraged to contact this care manager with questions.      Goals Addressed             This Visit's Progress    Effective surgery without complications   On track    Care Coordination Interventions: Evaluation of current treatment plan related to transurethral resection of bladder tumor and patient's adherence to plan as established by provider Advised patient to discuss follow up with urology and/or go to urgent care if having trouble with urinating or bleeding Reviewed scheduled/upcoming provider appointments including 4/17 for preadmission testing and on 4/26 for follow up procedure (cystoscopy) Discussed plans with patient for ongoing care management follow up and provided patient with direct contact information for care management team         SDOH assessments and interventions completed:  No     Care Coordination Interventions:  Yes, provided   Interventions Today    Flowsheet Row Most Recent Value  Chronic Disease   Chronic disease during today's visit Other  [bladder tumor]  General Interventions   General Interventions Discussed/Reviewed General Interventions Reviewed, Doctor Visits  Doctor Visits Discussed/Reviewed Doctor Visits Reviewed, Specialist  PCP/Specialist Visits Compliance with follow-up visit  Education Interventions   Education Provided Provided Education  Provided Verbal Education On When to see the doctor, Other   [post surgical complication]        Follow up plan: Follow up call scheduled for 4/30    Encounter Outcome:  Pt. Visit Completed   Valente David, RN, MSN, [place] Care Management Care Management Coordinator [phone]

## 2022-06-19 ENCOUNTER — Other Ambulatory Visit: Payer: Self-pay | Admitting: Internal Medicine

## 2022-06-19 ENCOUNTER — Telehealth: Payer: Self-pay

## 2022-06-19 ENCOUNTER — Ambulatory Visit: Payer: Self-pay

## 2022-06-19 NOTE — Telephone Encounter (Signed)
Error in charting.

## 2022-06-19 NOTE — Chronic Care Management (AMB) (Signed)
   06/19/2022  Brandi Calderon [facility] Aug 11, 1965 [id number]   Reason for encounter. CCM status changed to previously enrolled.   Noreene Larsson RN, MSN, CCM RN Care Manager  Chronic Care Management Direct Number: [phone]

## 2022-06-20 NOTE — Telephone Encounter (Signed)
Requested medication (s) are due for refill today - yes #30  Requested medication (s) are on the active medication list -yes  Future visit scheduled -yes  Last refill: 04/22/22  Notes to clinic: non delegated Rx  Requested Prescriptions  Pending Prescriptions Disp Refills   clonazePAM (KLONOPIN) 0.5 MG tablet [Pharmacy Med Name: CLONAZEPAM 0.5 MG TABLET] 30 tablet 0    Sig: TAKE 1 TABLET BY MOUTH EVERY DAY AS NEEDED FOR ANXIETY     Not Delegated - Psychiatry: Anxiolytics/Hypnotics 2 Failed - 06/19/2022 10:44 AM      Failed - This refill cannot be delegated      Failed - Urine Drug Screen completed in last 360 days      Passed - Patient is not pregnant      Passed - Valid encounter within last 6 months    Recent Outpatient Visits           1 month ago Abnormal uterine bleeding   [place], [place] W, NP   1 month ago Type 2 diabetes mellitus with hyperglycemia, without long-term current use of insulin [facility])   [facility] Delles, Grayland Ormond A, RPH-CPP   3 months ago Encounter for general adult medical examination with abnormal findings   [facility] [place], [place] W, NP   4 months ago Type 2 diabetes mellitus with other specified complication, without long-term current use of insulin [facility])   [place], Grayland Ormond A, RPH-CPP   5 months ago Type 2 diabetes mellitus with other specified complication, without long-term current use of insulin ([place])   [place], Virl Diamond, RPH-CPP       Future Appointments             In 1 month Diamantina [place], Herbert Seta, MD [place]   In 2 months [place], [name], NP [facility], [facility]               Requested Prescriptions  Pending Prescriptions Disp Refills   clonazePAM (KLONOPIN) 0.5 MG tablet [Pharmacy Med Name: CLONAZEPAM 0.5 MG  TABLET] 30 tablet 0    Sig: TAKE 1 TABLET BY MOUTH EVERY DAY AS NEEDED FOR ANXIETY     Not Delegated - Psychiatry: Anxiolytics/Hypnotics 2 Failed - 06/19/2022 10:44 AM      Failed - This refill cannot be delegated      Failed - Urine Drug Screen completed in last 360 days      Passed - Patient is not pregnant      Passed - Valid encounter within last 6 months    Recent Outpatient Visits           1 month ago Abnormal uterine bleeding   [name] [place], [place] W, NP   1 month ago Type 2 diabetes mellitus with hyperglycemia, without long-term current use of insulin [facility])   [facility] Delles, Grayland Ormond A, RPH-CPP   3 months ago Encounter for general adult medical examination with abnormal findings   [facility] [place], [place] W, NP   4 months ago Type 2 diabetes mellitus with other specified complication, without long-term current use of insulin [facility])   [place], Grayland Ormond A, RPH-CPP   5 months ago Type 2 diabetes mellitus with other specified complication,  without long-term current use of insulin [facility])   [place], RPH-CPP       Future Appointments             In 1 month Diamantina Providence, Herbert Seta, MD [place]   In 2 months [place], [name], NP [facility], [place]

## 2022-06-23 DIAGNOSIS — J449 Chronic obstructive pulmonary disease, unspecified: Secondary | ICD-10-CM | POA: Diagnosis not present

## 2022-06-24 ENCOUNTER — Ambulatory Visit: Payer: Medicare HMO | Admitting: Pharmacist

## 2022-06-24 ENCOUNTER — Other Ambulatory Visit: Payer: Self-pay | Admitting: Internal Medicine

## 2022-06-24 DIAGNOSIS — E1165 Type 2 diabetes mellitus with hyperglycemia: Secondary | ICD-10-CM

## 2022-06-24 DIAGNOSIS — J418 Mixed simple and mucopurulent chronic bronchitis: Secondary | ICD-10-CM

## 2022-06-24 DIAGNOSIS — F17200 Nicotine dependence, unspecified, uncomplicated: Secondary | ICD-10-CM

## 2022-06-24 NOTE — Patient Instructions (Signed)
Goals Addressed             This Visit's Progress    Pharmacy Goals       Our goal A1c is less than 7%. This corresponds with fasting sugars less than 130 and 2 hour after meal sugars less than 180. Please check your blood sugar each morning  Our goal bad cholesterol, or LDL, is less than 70 . This is why it is important to continue taking your atorvastatin  Please follow up with the [place] Quitline for their help with quitting smoking.  The  Quitline phone number is: 1-800-784-8669  Feel free to call me with any questions or concerns. I look forward to our next call!  Zakyah Yanes Alima Naser, PharmD, BCACP Clinical Pharmacist [facility] [phone]        

## 2022-06-24 NOTE — Progress Notes (Signed)
06/24/2022 Name: Brandi Calderon MRN: [id number] DOB: Nov 30, 1965  Chief Complaint  Patient presents with   Medication Management    Brandi Calderon is a 57 y.o. year old female who presented for a telephone visit.   They were referred to the pharmacist by their PCP for assistance in managing diabetes, hypertension, and hyperlipidemia.    Subjective:  Care Team: Primary Care Provider: [name], NP; Next Scheduled Visit: 09/13/2022 Pulmonologist: [facility]; Next Scheduled Visit: 06/10/2022 Endocrinologist: Dr. Gabriel Carina; Next Scheduled Visit: 08/08/2022 (labs 5/2)  Urologist: Billey Co, MD; Upcoming procedure on 07/26/2022 (PreAdmission on 4/17)  Medication Access/Adherence  Current Pharmacy:  [id number] - [place], [place] [address] [place] [address] Phone: [phone] Fax: [phone]   Patient reports affordability concerns with their medications: No  Patient reports access/transportation concerns to their pharmacy: No  Patient reports adherence concerns with their medications:  No     Patient currently using weekly pillbox to organize her medications - Interested in discussing pill packaging through [employer] again in future, but reports doing well with weekly pillbox for now  From review of chart, note patient seen for Office Visit with Urologist on 3/13 - Note patient underwent Transurethral Resection of Bladder Tumor on 3/8 - Patient seen in [facility] ED on 3/13 related to lower abdominal pain, bladder spasms and leakage - Urologist advised patient:  Prescribed 5-day course of Bactrim based on urinalysis  Continue oxybutynin 10 mg XL in the setting of extensive resection   Note procedure for Cystoscopy with bladder biopsy scheduled for 4/26  Today patient confirms her symptoms improved/pain resolved with completing course of Bactrim as prescribed  Reports tolerating oxybutynin well. Denies symptoms  of dizziness. Reports has noticed some dry mouth, but tolerating    Diabetes: Patient followed by [facility] Endocrinology for management of T2DM   Current medications:  Metformin 1000 mg twice daily Trulicity 4.5 mg weekly on Mondays Jardiance 25 mg daily glipizide ER 5 mg daily  Medications tried in the past: Ozempic (Endocrinologist switched patient back to Trulicity in November due to back order of Ozempic 2 mg strength)   Recalls last checked morning fasting blood sugar on 3/22, reading ~120   Denies symptoms of hypoglycemia   Statin: atorvastatin 80 mg daily   ACE-I: lisinopril 5 mg daily   COPD:   Current medications: Trelegy - 1 puff daily Albuterol - 1-2 puffs every 6 hours as needed Overnight oxygen   Reports using Trelegy consistently and rinsing mouth after each use and using rescue (albuterol) inhaler as needed as directed  Reports feels like latest albuterol inhaler that she picked up from her pharmacy is not working as well as the brand that she had before    Tobacco Abuse: Reports has reduced smoking to ~1 pack/day and working on taper back more Denies interest in setting quit date at this time as feels like cutting back gradually is a better technique for her Reports family supporting her with plan to quit smoking Triggers: boredom, others smoking around her, stress Note has discussed starting Chantix with Endocrinologist, but patient denies interest in starting at this time   Objective:  Lab Results  Component Value Date   HGBA1C 7.1 (H) 03/14/2022    Lab Results  Component Value Date   CREATININE 0.74 06/12/2022   BUN 12 06/12/2022   NA 138 06/12/2022   K 4.2 06/12/2022   CL 104 06/12/2022  CO2 27 06/12/2022    Lab Results  Component Value Date   CHOL 170 03/14/2022   HDL 44 (L) 03/14/2022   LDLCALC 103 (H) 03/14/2022   TRIG 132 03/14/2022   CHOLHDL 3.9 03/14/2022   BP Readings from Last 3 Encounters:  06/12/22 108/69   06/12/22 125/64  06/10/22 (!) 110/58   Pulse Readings from Last 3 Encounters:  06/12/22 73  06/12/22 81  06/10/22 82     Medications Reviewed Today     Reviewed by Hayden Pedro, RN (Registered Nurse) on 06/14/22 at 1205  Med List Status: <None>   Medication Order Taking? Sig Documenting Provider Last Dose Status Informant  albuterol (VENTOLIN HFA) 108 (90 Base) MCG/ACT inhaler [id number] No INHALE 1-2 PUFFS BY MOUTH EVERY 6 HOURS AS NEEDED FOR WHEEZE OR SHORTNESS OF BREATH [name], [name], NP Taking Active   Aspirin-Salicylamide-Caffeine (BC HEADACHE POWDER PO) [id number] No Take 4 packets by mouth daily as needed (pain). [provider] Taking Active Self  atorvastatin (LIPITOR) 80 MG tablet [id number] No Take 1 tablet (80 mg total) by mouth once daily [provider] Taking Active   Blood Glucose Monitoring Suppl (ONETOUCH VERIO FLEX SYSTEM) w/Device KIT [id number] No Use to check blood sugar daily.  DX E11.9 [name], NP Taking Active   clonazePAM (KLONOPIN) 0.5 MG tablet [id number] No TAKE 1 TABLET BY MOUTH EVERY DAY AS NEEDED FOR ANXIETY [name], [name], NP Taking Active   fluticasone (FLONASE) 50 MCG/ACT nasal spray [id number]  SPRAY 2 SPRAYS INTO EACH NOSTRIL EVERY DAY [name], NP  Active   furosemide (LASIX) 40 MG tablet [id number] No TAKE 1 TABLET BY MOUTH EVERY DAY [name], [name], NP Taking Active   gabapentin (NEURONTIN) 300 MG capsule [id number] No Take 300 mg by mouth at bedtime. [provider] Taking Active Self  glipiZIDE (GLUCOTROL XL) 5 MG 24 hr tablet [id number] No Take 5 mg by mouth daily. [provider] Taking Active Self  glucose blood test strip [id number] No Use as instructed [name], NP Taking Active Self  JARDIANCE 25 MG TABS tablet [id number] No Take 25 mg by mouth daily. [provider] Taking Active Self  Lancets Glory Rosebush ULTRASOFT) lancets [id number] No Use as instructed [name],  NP Taking Active Self  lisinopril (ZESTRIL) 5 MG tablet [id number] No Take 1 tablet (5 mg total) by mouth once daily [provider] Taking Active   metFORMIN (GLUCOPHAGE) 1000 MG tablet [id number] No TAKE 1 TABLET (1,000 MG TOTAL) BY MOUTH 2 (TWO) TIMES DAILY WITH A MEAL. [name], NP Taking Active Self  oxybutynin (DITROPAN XL) 10 MG 24 hr tablet [id number]  Take 1 tablet (10 mg total) by mouth daily. Billey Co, MD  Active   oxyCODONE (ROXICODONE) 5 MG immediate release tablet [id number] No Take 1 tablet (5 mg total) by mouth every 8 (eight) hours as needed for up to 3 days. Rada Hay, MD Taking Active   OXYGEN [id number] No Inhale 1 L into the lungs at bedtime. [provider] Taking Active Self  pantoprazole (PROTONIX) 40 MG tablet [id number]  Take 1 tablet (40 mg total) by mouth daily as needed (Heartburn). [name], NP  Active   polyethylene glycol (MIRALAX) 17 g packet [id number] No Take 17 g by mouth daily. Rada Hay, MD Taking Active   sulfamethoxazole-trimethoprim (BACTRIM DS) 800-160 MG tablet [id number]  Take 1 tablet by mouth 2 (two) times daily. Nickolas Madrid  C, MD  Active   TRELEGY ELLIPTA 100-62.5-25 MCG/ACT AEPB [id number] No Inhale 1 puff into the lungs daily. [name], NP Taking Active Self  TRULICITY 4.5 0000000 SOPN [id number] No Inject 4.5 mg into the skin once a week. [provider] Taking Active Self  Med List Note [name], RN 06/25/18 [id number]): UDS 06-25-2018              Assessment/Plan:   Diabetes: - Have reviewed dietary modifications including  Have counseled patient on importance of having regular well-balanced meals, while controlling carbohydrate portion sizes Encouraged patient to reduce intake of sugary beverages, to work on drinking water, rather than soda - Patient to monitor home blood sugar in morning before breakfast and at bedtime, keep log of results and have this to review at  medical appointments and to call Endocrinologist sooner for reading outside of established parameters or symptoms    COPD: - Counsel patient on technique for priming albuterol HFA inhaler and technique for cleaning ONLY PLASTIC PORTION of HFA inhaler device Patient verbalizes understanding. Confirms will try cleaning, drying and again using device to see if improvement. If no improvement, encourage patient to follow up with pharmacy to see if possible for pharmacy to order previous generic brand of inhaler that she received last month  - Recommend to continue using Trelegy inhaler consistently every day    Tobacco Abuse - Encourage patient to consider using nicotine replacement, patches and/or lozenges, as prescribed by Pulmonologist to aid with quitting - Have provided information on 1 800 QUIT NOW support program   Medication Management: - Patient interested discussing switch to pill pack in the future       Follow Up Plan: Clinic Pharmacist will follow up with patient again on 08/19/2022 at 10:00 AM    Wallace Cullens, PharmD, [place] [phone]

## 2022-06-25 NOTE — Telephone Encounter (Signed)
Requested Prescriptions  Pending Prescriptions Disp Refills   Blood Glucose Monitoring Suppl (Vevay) w/Device KIT [Pharmacy Med Name: ONE TOUCH VERIO FLEX METER] 1 kit 1    Sig: Use to check blood sugar daily.  DX E11.9     Endocrinology: Diabetes - Testing Supplies Passed - 06/24/2022  7:59 AM      Passed - Valid encounter within last 12 months    Recent Outpatient Visits           Yesterday Type 2 diabetes mellitus with hyperglycemia, without long-term current use of insulin [facility])   [facility], Grayland Ormond A, RPH-CPP   1 month ago Abnormal uterine bleeding   [facility] [place], [place] W, NP   1 month ago Type 2 diabetes mellitus with hyperglycemia, without long-term current use of insulin [facility])   [place], Virl Diamond, RPH-CPP   3 months ago Encounter for general adult medical examination with abnormal findings   [facility] [place], [place] W, NP   4 months ago Type 2 diabetes mellitus with other specified complication, without long-term current use of insulin [facility])   [place], RPH-CPP       Future Appointments             In 1 month Diamantina [place], Herbert Seta, MD [place]   In 2 months [place], [name], NP [facility], [facility]

## 2022-07-01 ENCOUNTER — Other Ambulatory Visit: Payer: Self-pay | Admitting: Internal Medicine

## 2022-07-01 DIAGNOSIS — J449 Chronic obstructive pulmonary disease, unspecified: Secondary | ICD-10-CM

## 2022-07-02 NOTE — Telephone Encounter (Signed)
Requested medication (s) are due for refill today - yes  Requested medication (s) are on the active medication list -yes  Future visit scheduled -yes  Last refill: 03/07/22 1each 2 RF  Notes to clinic: off protocol- provider review   Requested Prescriptions  Pending Prescriptions Disp [place] 100-62.5-25 MCG/ACT AEPB [Pharmacy Med Name: TRELEGY ELLIPTA 100-62.5-25] 60 each 2    Sig: TAKE 1 PUFF BY MOUTH EVERY DAY     Off-Protocol Failed - 07/01/2022  5:18 PM      Failed - Medication not assigned to a protocol, review manually.      Passed - Valid encounter within last 12 months    Recent Outpatient Visits           1 week ago Type 2 diabetes mellitus with hyperglycemia, without long-term current use of insulin [facility])   [facility] Delles, Grayland Ormond A, RPH-CPP   1 month ago Abnormal uterine bleeding   [facility] [place], [place] W, NP   1 month ago Type 2 diabetes mellitus with hyperglycemia, without long-term current use of insulin [facility])   [facility] Delles, Virl Diamond, RPH-CPP   3 months ago Encounter for general adult medical examination with abnormal findings   [facility] [place], [place] W, NP   4 months ago Type 2 diabetes mellitus with other specified complication, without long-term current use of insulin [facility])   [place], RPH-CPP       Future Appointments             In 1 month Diamantina [place], Herbert Seta, MD [place]   In 2 months [place], [name], NP [facility]               Requested Prescriptions  Pending Prescriptions [place] 100-62.5-25 MCG/ACT AEPB [Pharmacy Med Name: TRELEGY ELLIPTA 100-62.5-25] 60 each 2    Sig: TAKE 1 PUFF BY MOUTH EVERY DAY     Off-Protocol Failed - 07/01/2022  5:18 PM      Failed -  Medication not assigned to a protocol, review manually.      Passed - Valid encounter within last 12 months    Recent Outpatient Visits           1 week ago Type 2 diabetes mellitus with hyperglycemia, without long-term current use of insulin [facility])   [facility] Delles, Grayland Ormond A, RPH-CPP   1 month ago Abnormal uterine bleeding   [facility] [place], [place] W, NP   1 month ago Type 2 diabetes mellitus with hyperglycemia, without long-term current use of insulin [facility])   [place], Virl Diamond, RPH-CPP   3 months ago Encounter for general adult medical examination with abnormal findings   [facility] [place], [place] W, NP   4 months ago Type 2 diabetes mellitus with other specified complication, without long-term current use of insulin [facility])   [place], RPH-CPP       Future Appointments             In 1 month Diamantina Providence, Herbert Seta, MD [place]   In 2 months [place], [name], NP [facility], [place]

## 2022-07-06 ENCOUNTER — Other Ambulatory Visit: Payer: Self-pay | Admitting: Urology

## 2022-07-16 ENCOUNTER — Other Ambulatory Visit: Payer: Self-pay | Admitting: Internal Medicine

## 2022-07-17 ENCOUNTER — Telehealth: Payer: Self-pay | Admitting: Urology

## 2022-07-17 ENCOUNTER — Other Ambulatory Visit: Payer: Medicare HMO

## 2022-07-17 ENCOUNTER — Encounter
Admission: RE | Admit: 2022-07-17 | Discharge: 2022-07-17 | Disposition: A | Discharge status: Home / Self Care | Payer: Medicare HMO | Source: Ambulatory Visit | Attending: Urology | Admitting: Urology

## 2022-07-17 VITALS — Ht 61.0 in | Wt 130.1 lb

## 2022-07-17 DIAGNOSIS — I10 Essential (primary) hypertension: Secondary | ICD-10-CM

## 2022-07-17 DIAGNOSIS — Z79899 Other long term (current) drug therapy: Secondary | ICD-10-CM

## 2022-07-17 DIAGNOSIS — D494 Neoplasm of unspecified behavior of bladder: Secondary | ICD-10-CM | POA: Diagnosis not present

## 2022-07-17 DIAGNOSIS — Z01812 Encounter for preprocedural laboratory examination: Secondary | ICD-10-CM

## 2022-07-17 DIAGNOSIS — Z01818 Encounter for other preprocedural examination: Secondary | ICD-10-CM

## 2022-07-17 LAB — URINALYSIS, COMPLETE
Bilirubin, UA: NEGATIVE
Nitrite, UA: POSITIVE — AB
Specific Gravity, UA: 1.02 (ref 1.005–1.030)
Urobilinogen, Ur: 0.2 mg/dL (ref 0.2–1.0)
pH, UA: 6 (ref 5.0–7.5)

## 2022-07-17 LAB — MICROSCOPIC EXAMINATION: WBC, UA: >30 /hpf — AB (ref 0–5)

## 2022-07-17 NOTE — Telephone Encounter (Signed)
UA & Cx obtained today, please advise

## 2022-07-17 NOTE — Telephone Encounter (Signed)
Requested medication (s) are due for refill today: yes  Requested medication (s) are on the active medication list: yes   Last refill:  06/20/22 #30 0 refills  Future visit scheduled: yes 1 month  Notes to clinic:  not delegated per protocol, do you want to refill Rx?     Requested Prescriptions  Pending Prescriptions Disp Refills   clonazePAM (KLONOPIN) 0.5 MG tablet [Pharmacy Med Name: CLONAZEPAM 0.5 MG TABLET] 30 tablet 0    Sig: TAKE 1 TABLET BY MOUTH EVERY DAY AS NEEDED FOR ANXIETY     Not Delegated - Psychiatry: Anxiolytics/Hypnotics 2 Failed - 07/16/2022  2:43 PM      Failed - This refill cannot be delegated      Failed - Urine Drug Screen completed in last 360 days      Passed - Patient is not pregnant      Passed - Valid encounter within last 6 months    Recent Outpatient Visits           3 weeks ago Type 2 diabetes mellitus with hyperglycemia, without long-term current use of insulin [facility])   [place] [facility] Delles, Gentry Fitz A, RPH-CPP   2 months ago Abnormal uterine bleeding   [facility] [place], [place] W, NP   2 months ago Type 2 diabetes mellitus with hyperglycemia, without long-term current use of insulin [facility])   [facility] Delles, Gentry Fitz A, RPH-CPP   4 months ago Encounter for general adult medical examination with abnormal findings   [facility] [place], [place] W, NP   5 months ago Type 2 diabetes mellitus with other specified complication, without long-term current use of insulin [facility])   [place] [facility] Delles, Jackelyn Poling, RPH-CPP       Future Appointments             In 3 weeks Richardo Hanks, Laurette Schimke, MD [facility] [place]   In 1 month [place], Salvadore Oxford, NP  [facility], [facility]

## 2022-07-17 NOTE — Patient Instructions (Addendum)
Your procedure is scheduled on:07-26-22 Friday Report to the Registration Desk on the 1st floor of the Medical Mall.Then proceed to the 2nd floor Surgery Desk To find out your arrival time, please call [phone] between 1PM - 3PM on:07-25-22 Thursday If your arrival time is 6:00 am, do not arrive before that time as the Medical Mall entrance doors do not open until 6:00 am.  REMEMBER: Instructions that are not followed completely may result in serious medical risk, up to and including death; or upon the discretion of your surgeon and anesthesiologist your surgery may need to be rescheduled.  Do not eat food OR drink any liquids after midnight the night before surgery.  No gum chewing or hard candies.  One week prior to surgery:Last dose on 07-18-22 Stop Anti-inflammatories (NSAIDS) such as Advil, Aleve, Ibuprofen, Motrin, Naproxen, Naprosyn and Aspirin based products such as Excedrin, Goody's Powder, BC Powder.You may however, take Tylenol if needed for pain up until the day of surgery.  Stop ANY OVER THE COUNTER supplements/vitamins 7 days prior to surgery   Continue taking all prescribed medications with the exception of the following: -Stop your Trulicity 7 days prior to surgery-Last dose was on 07-15-22-Do NOT take again until after your surgery -Stop your metFORMIN (GLUCOPHAGE) 2 days prior to surgery-Last dose will be on 07-23-22 Tuesday -Stop your JARDIANCE 3 days prior to surgery-Last dose will be on 07-22-22 Monday  TAKE ONLY THESE MEDICATIONS THE MORNING OF SURGERY WITH A SIP OF WATER: -oxybutynin (DITROPAN-XL)  -pantoprazole (PROTONIX)-take one the night before surgery and one the morning of surgery -You may take your clonazePAM (KLONOPIN) if needed for anxiety  Continue your lisinopril (ZESTRIL) and furosemide (LASIX) up until the day prior to surgery-Do NOT take these medications the morning of surgery  Use your Trelegy Ellipta and your Albuterol Inhaler the day of surgery and  bring your Albuterol Inhaler to the hospital  No Alcohol for 24 hours before or after surgery.  No Smoking including e-cigarettes for 24 hours before surgery.  No chewable tobacco products for at least 6 hours before surgery.  No nicotine patches on the day of surgery.  Do not use any "recreational" drugs for at least a week (preferably 2 weeks) before your surgery.  Please be advised that the combination of cocaine and anesthesia may have negative outcomes, up to and including death. If you test positive for cocaine, your surgery will be cancelled.  On the morning of surgery brush your teeth with toothpaste and water, you may rinse your mouth with mouthwash if you wish. Do not swallow any toothpaste or mouthwash.  Do not wear jewelry, make-up, hairpins, clips or nail polish.  Do not wear lotions, powders, or perfumes.   Do not shave body hair from the neck down 48 hours before surgery.  Contact lenses, hearing aids and dentures may not be worn into surgery.  Do not bring valuables to the hospital. [facility] is not responsible for any missing/lost belongings or valuables.    Notify your doctor if there is any change in your medical condition (cold, fever, infection).  Wear comfortable clothing (specific to your surgery type) to the hospital.  After surgery, you can help prevent lung complications by doing breathing exercises.  Take deep breaths and cough every 1-2 hours. Your doctor may order a device called an Incentive Spirometer to help you take deep breaths. When coughing or sneezing, hold a pillow firmly against your incision with both hands. This is called "splinting." Doing  this helps protect your incision. It also decreases belly discomfort.  If you are being admitted to the hospital overnight, leave your suitcase in the car. After surgery it may be brought to your room.  In case of increased patient census, it may be necessary for you, the patient, to continue your  postoperative care in the Same Day Surgery department.  If you are being discharged the day of surgery, you will not be allowed to drive home. You will need a responsible individual to drive you home and stay with you for 24 hours after surgery.   If you are taking public transportation, you will need to have a responsible individual with you.  Please call the Pre-admissions Testing Dept. at [phone] if you have any questions about these instructions.  Surgery Visitation Policy:  Patients having surgery or a procedure may have two visitors.  Children under the age of 28 must have an adult with them who is not the patient.

## 2022-07-17 NOTE — Telephone Encounter (Signed)
Patient came in for lab appt today (07/17/22), and said that she is still having pain when she urinates. She initially had pain right after her surgery and it went away, but now pain has returned. She is asking if this is normal. Her second surgery is coming up on 4/26, and she is concerned.

## 2022-07-18 ENCOUNTER — Encounter
Admission: RE | Admit: 2022-07-18 | Discharge: 2022-07-18 | Disposition: A | Discharge status: Home / Self Care | Payer: Medicare HMO | Source: Ambulatory Visit | Attending: Urology | Admitting: Urology

## 2022-07-18 DIAGNOSIS — Z79899 Other long term (current) drug therapy: Secondary | ICD-10-CM | POA: Diagnosis not present

## 2022-07-18 DIAGNOSIS — Z01812 Encounter for preprocedural laboratory examination: Secondary | ICD-10-CM | POA: Diagnosis not present

## 2022-07-18 DIAGNOSIS — I10 Essential (primary) hypertension: Secondary | ICD-10-CM

## 2022-07-18 LAB — BASIC METABOLIC PANEL
Anion gap: 7 (ref 5–15)
BUN: 12 mg/dL (ref 6–20)
CO2: 29 mmol/L (ref 22–32)
Calcium: 9.3 mg/dL (ref 8.9–10.3)
Chloride: 106 mmol/L (ref 98–111)
Creatinine, Ser: 0.61 mg/dL (ref 0.44–1.00)
GFR, Estimated: >60 mL/min (ref >60)
Glucose, Bld: 104 mg/dL — ABNORMAL HIGH (ref 70–99)
Potassium: 4 mmol/L (ref 3.5–5.1)
Sodium: 142 mmol/L (ref 135–145)

## 2022-07-19 NOTE — Progress Notes (Signed)
  Perioperative Services Pre-Admission/Anesthesia Testing    Date: 07/19/22  Name: Brandi Calderon MRN:   [id number]  Re: GLP-1 clearance and provider recommendations   Planned Surgical Procedure(s):    Case: [id number] Date/Time: 07/26/22 1057   Procedure: CYSTOSCOPY WITH BLADDER BIOPSY   Anesthesia type: General   Pre-op diagnosis: Bladder Tumor   Location: [facility] OR ROOM 10 / [facility]   Surgeons: [name], MD      Clinical Notes:  Patient is scheduled for the above procedure with the indicated provider/surgeon. In review of her medication reconciliation it was noted that patient is on a prescribed GLP-1 medication. Per guidelines issued by the American Society of Anesthesiologists (ASA), it is recommended that these medications be held for 7 days prior to the patient undergoing any type of elective surgical procedure. The patient is taking the following GLP-1 medication:   SEMAGLUTIDE    EXENATIDE   LIRAGLUTIDE    LIXISENATIDE   DULAGLUTIDE      OTHER GLP-1 medication: _______________  Reached out to prescribing provider Tedd Sias, MD) to make them aware of the guidelines from anesthesia. Given that this patient takes the prescribed GLP-1 medication for her  diabetes diagnosis, rather than for weight loss, recommendations from the prescribing provider were solicited. Prescribing provider made aware of the following so that informed decision/POC can be developed for this patient that may be taking medications belonging to these drug classes:  Oral GLP-1 medications will be held 1 day prior to surgery.  Injectable GLP-1 medications will be held 7 days prior to surgery.  Metformin is routinely held 48 hours prior to surgery due to renal concerns, potential need for contrasted imaging perioperatively, and the potential for tissue hypoxia leading to drug induced lactic acidosis.  All SGLT2i medications are held 72 hours prior to surgery as  they can be associated with the increased potential for developing euglycemic diabetic ketoacidosis (EDKA).   Impression and Plan:  Brandi Calderon is on a prescribed GLP-1 medication, which induces the known side effect of decreased gastric emptying. Efforts are bring made to mitigate the risk of perioperative hyperglycemic events, as elevated blood glucose levels have been found to contribute to intra/postoperative complications. Additionally, hyperglycemic extremes can potentially necessitate the postponing of a patient's elective case in order to better optimize perioperative glycemic control, again with the aforementioned guidelines in place. With this in mind, recommendations have been sought from the prescribing provider, who has cleared patient to proceed with holding the prescribed GLP-1 as per the guidelines from the ASA.   Provider recommending: no further recommendations received from the prescribing provider.  Copy of signed clearance and recommendations placed on patient's chart for inclusion in their medical record and for review by the surgical/anesthetic team on the day of her procedure.   Quentin Mulling, MSN, APRN, FNP-C, CEN [facility]  Peri-operative Services Nurse Practitioner Phone: [phone] 07/19/22 10:32 AM  NOTE: This note has been prepared using Dragon dictation software. Despite my best ability to proofread, there is always the potential that unintentional transcriptional errors may still occur from this process.

## 2022-07-21 LAB — CULTURE, URINE COMPREHENSIVE

## 2022-07-22 ENCOUNTER — Telehealth: Payer: Self-pay

## 2022-07-22 MED ORDER — NITROFURANTOIN MONOHYD MACRO 100 MG PO CAPS: 100.0000 mg | ORAL_CAPSULE | Freq: Two times a day (BID) | ORAL | 0 refills | Status: DC

## 2022-07-22 NOTE — Telephone Encounter (Signed)
-----   Message from [name], MD sent at 07/18/2022  7:25 AM EDT ----- Please start nitrofurantoin  BID x 5 days to sterilize urine prior to upcoming surgery. Will call with final culture if need to change abx  Legrand Rams, MD 07/18/2022

## 2022-07-22 NOTE — Telephone Encounter (Signed)
Spoke with pt. Pt. Advised of all results and verbalized understanding. [facility] [place] [facility] patient request.

## 2022-07-23 ENCOUNTER — Telehealth: Payer: Self-pay | Admitting: Internal Medicine

## 2022-07-23 NOTE — Telephone Encounter (Signed)
Medication Refill - Medication: clonazePAM (KLONOPIN) 0.5 MG tablet   Has the patient contacted their pharmacy? Yes.   Pharmacy are stating they never recvd the script for the refills.   Preferred Pharmacy (with phone number or street name):  [id number] [place] [place] - [address] Phone: [phone]  Fax: [phone]     Has the patient been seen for an appointment in the last year OR does the patient have an upcoming appointment? No.  Agent: Please be advised that RX refills may take up to 3 business days. We ask that you follow-up with your pharmacy.  Patient stated she has not had her medicine for a few days and she has been trying to get it refilled. Please advise patient

## 2022-07-23 NOTE — Telephone Encounter (Signed)
[facility] called and spoke to [name], [facility] about the refill(s) clonazepam requested. Advised it was sent on 07/18/22 #30/0 refill(s). He says they have it on file and the patient was to call when she needed it. I asked is it due for refill today, he says yes and he will get it ready for the patient to pick up. Patient called and advised, she verbalized understanding.

## 2022-07-24 DIAGNOSIS — J449 Chronic obstructive pulmonary disease, unspecified: Secondary | ICD-10-CM | POA: Diagnosis not present

## 2022-07-25 MED ORDER — SODIUM CHLORIDE 0.9 % IV SOLN: INTRAVENOUS | Status: DC

## 2022-07-25 MED ORDER — CHLORHEXIDINE GLUCONATE 0.12 % MT SOLN
15.0000 mL | Freq: Once | OROMUCOSAL | Status: AC
  Administered 2022-07-26: 15 mL via OROMUCOSAL

## 2022-07-25 MED ORDER — ORAL CARE MOUTH RINSE: 15.0000 mL | Freq: Once | OROMUCOSAL | Status: AC

## 2022-07-25 MED ORDER — GENTAMICIN SULFATE 40 MG/ML IJ SOLN
5.0000 mg/kg | INTRAVENOUS | Status: AC
  Administered 2022-07-26: 300 mg via INTRAVENOUS
  Filled 2022-07-25: qty 7.5

## 2022-07-26 ENCOUNTER — Other Ambulatory Visit: Payer: Self-pay

## 2022-07-26 ENCOUNTER — Encounter
Admission: RE | Disposition: A | Discharge status: Home / Self Care | Payer: Self-pay | Source: Home / Self Care | Attending: Urology

## 2022-07-26 ENCOUNTER — Encounter: Payer: Self-pay | Admitting: Urology

## 2022-07-26 ENCOUNTER — Ambulatory Visit
Admission: RE | Admit: 2022-07-26 | Discharge: 2022-07-26 | Disposition: A | Discharge status: Home / Self Care | Payer: Medicare HMO | Attending: Urology | Admitting: Urology

## 2022-07-26 ENCOUNTER — Ambulatory Visit: Payer: Medicare HMO | Admitting: Urgent Care

## 2022-07-26 DIAGNOSIS — Z8551 Personal history of malignant neoplasm of bladder: Secondary | ICD-10-CM | POA: Diagnosis not present

## 2022-07-26 DIAGNOSIS — G4730 Sleep apnea, unspecified: Secondary | ICD-10-CM | POA: Diagnosis not present

## 2022-07-26 DIAGNOSIS — Z8616 Personal history of COVID-19: Secondary | ICD-10-CM | POA: Diagnosis not present

## 2022-07-26 DIAGNOSIS — Z833 Family history of diabetes mellitus: Secondary | ICD-10-CM | POA: Insufficient documentation

## 2022-07-26 DIAGNOSIS — F17210 Nicotine dependence, cigarettes, uncomplicated: Secondary | ICD-10-CM | POA: Insufficient documentation

## 2022-07-26 DIAGNOSIS — N3081 Other cystitis with hematuria: Secondary | ICD-10-CM | POA: Diagnosis not present

## 2022-07-26 DIAGNOSIS — Z9981 Dependence on supplemental oxygen: Secondary | ICD-10-CM | POA: Insufficient documentation

## 2022-07-26 DIAGNOSIS — Z7985 Long-term (current) use of injectable non-insulin antidiabetic drugs: Secondary | ICD-10-CM | POA: Diagnosis not present

## 2022-07-26 DIAGNOSIS — I2510 Atherosclerotic heart disease of native coronary artery without angina pectoris: Secondary | ICD-10-CM | POA: Diagnosis not present

## 2022-07-26 DIAGNOSIS — C679 Malignant neoplasm of bladder, unspecified: Secondary | ICD-10-CM | POA: Insufficient documentation

## 2022-07-26 DIAGNOSIS — N3080 Other cystitis without hematuria: Secondary | ICD-10-CM

## 2022-07-26 DIAGNOSIS — D494 Neoplasm of unspecified behavior of bladder: Secondary | ICD-10-CM | POA: Diagnosis not present

## 2022-07-26 DIAGNOSIS — I10 Essential (primary) hypertension: Secondary | ICD-10-CM | POA: Diagnosis not present

## 2022-07-26 DIAGNOSIS — F1290 Cannabis use, unspecified, uncomplicated: Secondary | ICD-10-CM | POA: Diagnosis not present

## 2022-07-26 DIAGNOSIS — E119 Type 2 diabetes mellitus without complications: Secondary | ICD-10-CM | POA: Diagnosis not present

## 2022-07-26 DIAGNOSIS — F418 Other specified anxiety disorders: Secondary | ICD-10-CM | POA: Diagnosis not present

## 2022-07-26 DIAGNOSIS — Z01818 Encounter for other preprocedural examination: Secondary | ICD-10-CM

## 2022-07-26 HISTORY — PX: CYSTOSCOPY WITH BIOPSY: SHX5122

## 2022-07-26 LAB — GLUCOSE, CAPILLARY
Glucose-Capillary: 148 mg/dL — ABNORMAL HIGH (ref 70–99)
Glucose-Capillary: 175 mg/dL — ABNORMAL HIGH (ref 70–99)

## 2022-07-26 SURGERY — CYSTOSCOPY, WITH BIOPSY
Anesthesia: General | Site: Bladder

## 2022-07-26 MED ORDER — FENTANYL CITRATE (PF) 100 MCG/2ML IJ SOLN
INTRAMUSCULAR | Status: DC | PRN
  Administered 2022-07-26 (×2): 50 ug via INTRAVENOUS

## 2022-07-26 MED ORDER — MIDAZOLAM HCL 2 MG/2ML IJ SOLN
INTRAMUSCULAR | Status: DC | PRN
  Administered 2022-07-26: 2 mg via INTRAVENOUS

## 2022-07-26 MED ORDER — ROCURONIUM BROMIDE 100 MG/10ML IV SOLN
INTRAVENOUS | Status: DC | PRN
  Administered 2022-07-26: 40 mg via INTRAVENOUS

## 2022-07-26 MED ORDER — LACTATED RINGERS IV SOLN: INTRAVENOUS | Status: DC | PRN

## 2022-07-26 MED ORDER — SUGAMMADEX SODIUM 200 MG/2ML IV SOLN
INTRAVENOUS | Status: DC | PRN
  Administered 2022-07-26: 200 mg via INTRAVENOUS

## 2022-07-26 MED ORDER — ONDANSETRON HCL 4 MG/2ML IJ SOLN: 4.0000 mg | Freq: Once | INTRAMUSCULAR | Status: DC | PRN

## 2022-07-26 MED ORDER — OXYCODONE HCL 5 MG PO TABS: 5.0000 mg | ORAL_TABLET | Freq: Once | ORAL | Status: DC | PRN

## 2022-07-26 MED ORDER — MIDAZOLAM HCL 2 MG/2ML IJ SOLN
INTRAMUSCULAR | Status: AC
  Filled 2022-07-26: qty 2

## 2022-07-26 MED ORDER — STERILE WATER FOR IRRIGATION IR SOLN
Status: DC | PRN
  Administered 2022-07-26: 3000 mL via INTRAVESICAL

## 2022-07-26 MED ORDER — ONDANSETRON HCL 4 MG/2ML IJ SOLN
INTRAMUSCULAR | Status: DC | PRN
  Administered 2022-07-26: 4 mg via INTRAVENOUS

## 2022-07-26 MED ORDER — ROCURONIUM BROMIDE 10 MG/ML (PF) SYRINGE
PREFILLED_SYRINGE | INTRAVENOUS | Status: AC
  Filled 2022-07-26: qty 10

## 2022-07-26 MED ORDER — PROPOFOL 10 MG/ML IV BOLUS
INTRAVENOUS | Status: DC | PRN
  Administered 2022-07-26: 120 mg via INTRAVENOUS

## 2022-07-26 MED ORDER — FENTANYL CITRATE (PF) 100 MCG/2ML IJ SOLN
INTRAMUSCULAR | Status: AC
  Filled 2022-07-26: qty 2

## 2022-07-26 MED ORDER — LIDOCAINE HCL URETHRAL/MUCOSAL 2 % EX GEL
CUTANEOUS | Status: DC | PRN
  Administered 2022-07-26: 1 via URETHRAL

## 2022-07-26 MED ORDER — OXYBUTYNIN CHLORIDE ER 15 MG PO TB24: 15.0000 mg | ORAL_TABLET | Freq: Every day | ORAL | 3 refills | Status: DC

## 2022-07-26 MED ORDER — PROPOFOL 10 MG/ML IV BOLUS
INTRAVENOUS | Status: AC
  Filled 2022-07-26: qty 20

## 2022-07-26 MED ORDER — FENTANYL CITRATE (PF) 100 MCG/2ML IJ SOLN: 25.0000 ug | INTRAMUSCULAR | Status: DC | PRN

## 2022-07-26 MED ORDER — LIDOCAINE HCL (PF) 2 % IJ SOLN
INTRAMUSCULAR | Status: AC
  Filled 2022-07-26: qty 5

## 2022-07-26 MED ORDER — OXYCODONE HCL 5 MG/5ML PO SOLN: 5.0000 mg | Freq: Once | ORAL | Status: DC | PRN

## 2022-07-26 MED ORDER — LIDOCAINE HCL URETHRAL/MUCOSAL 2 % EX GEL
CUTANEOUS | Status: AC
  Filled 2022-07-26: qty 10

## 2022-07-26 MED ORDER — ACETAMINOPHEN 10 MG/ML IV SOLN: 1000.0000 mg | Freq: Once | INTRAVENOUS | Status: DC | PRN

## 2022-07-26 MED ORDER — HYDROCODONE-ACETAMINOPHEN 5-325 MG PO TABS: 1.0000 | ORAL_TABLET | Freq: Four times a day (QID) | ORAL | 0 refills | Status: AC | PRN

## 2022-07-26 MED ORDER — CHLORHEXIDINE GLUCONATE 0.12 % MT SOLN
OROMUCOSAL | Status: AC
  Filled 2022-07-26: qty 15

## 2022-07-26 MED ORDER — STERILE WATER FOR IRRIGATION IR SOLN
Status: DC | PRN
  Administered 2022-07-26: 500 mL

## 2022-07-26 MED ORDER — LIDOCAINE HCL (CARDIAC) PF 100 MG/5ML IV SOSY
PREFILLED_SYRINGE | INTRAVENOUS | Status: DC | PRN
  Administered 2022-07-26: 80 mg via INTRAVENOUS

## 2022-07-26 SURGICAL SUPPLY — 19 items
BAG DRAIN SIEMENS DORNER NS (MISCELLANEOUS) ×1 IMPLANT
BAG DRN NS LF (MISCELLANEOUS) ×1
BRUSH SCRUB EZ  4% CHG (MISCELLANEOUS) ×1
BRUSH SCRUB EZ 4% CHG (MISCELLANEOUS) ×1 IMPLANT
DRSG TELFA 3X4 N-ADH STERILE (GAUZE/BANDAGES/DRESSINGS) ×1 IMPLANT
ELECT REM PT RETURN 9FT ADLT (ELECTROSURGICAL) ×1
ELECTRODE REM PT RTRN 9FT ADLT (ELECTROSURGICAL) ×1 IMPLANT
GAUZE 4X4 16PLY ~~LOC~~+RFID DBL (SPONGE) ×2 IMPLANT
GLOVE BIOGEL PI IND STRL 7.5 (GLOVE) ×1 IMPLANT
GOWN STRL REUS W/ TWL LRG LVL3 (GOWN DISPOSABLE) ×1 IMPLANT
GOWN STRL REUS W/ TWL XL LVL3 (GOWN DISPOSABLE) ×1 IMPLANT
GOWN STRL REUS W/TWL LRG LVL3 (GOWN DISPOSABLE) ×1
GOWN STRL REUS W/TWL XL LVL3 (GOWN DISPOSABLE) ×1
KIT TURNOVER CYSTO (KITS) ×1 IMPLANT
PACK CYSTO AR (MISCELLANEOUS) ×1 IMPLANT
SET CYSTO W/LG BORE CLAMP LF (SET/KITS/TRAYS/PACK) ×1 IMPLANT
SURGILUBE 2OZ TUBE FLIPTOP (MISCELLANEOUS) ×1 IMPLANT
WATER STERILE IRR 3000ML UROMA (IV SOLUTION) ×1 IMPLANT
WATER STERILE IRR 500ML POUR (IV SOLUTION) ×1 IMPLANT

## 2022-07-26 NOTE — H&P (Signed)
07/26/22 10:59 AM   Brandi Calderon [facility] Jan 02, 1966 [id number]  CC: Bladder cancer  HPI: 57 year old female who presented with gross hematuria and found to have extensive 5+ centimeter bladder tumor, underwent original TURBT on 06/07/2022 with pathology showing high-grade T1 urothelial cell carcinoma, muscle present and not involved with tumor.  Here today for second look TURBT.  Overactive symptoms continue to improve using the oxybutynin.  Dysuria resolved.  Recently treated for positive urine culture on 4/17 with E. coli.   PMH: Past Medical History:  Diagnosis Date   Anxiety    Arthritis    Asthma    Back pain    COPD (chronic obstructive pulmonary disease) (HCC)    Depression    Diabetes mellitus    Headache    History of COVID-19    Hyperlipidemia    Hypertension    Neuromuscular disorder (HCC)    Sleep apnea    Tendonitis    Urinary tract infection due to extended-spectrum beta lactamase (ESBL) producing Escherichia coli 05/22/2022    Surgical History: Past Surgical History:  Procedure Laterality Date   CESAREAN SECTION     CESAREAN SECTION     3 times   COLONOSCOPY     cyst on neck     as a baby/right side   TRANSURETHRAL RESECTION OF BLADDER TUMOR N/A 06/07/2022   Procedure: TRANSURETHRAL RESECTION OF BLADDER TUMOR (TURBT);  Surgeon: [name], MD;  Location: [facility] ORS;  Service: Urology;  Laterality: N/A;     Family History: Family History  Problem Relation Age of Onset   Diabetes Mother    Kidney disease Father    Diabetes Father    Dementia Father    Diabetes Sister    Diabetes Brother    Diabetes Brother    Colon cancer Neg Hx    Esophageal cancer Neg Hx    Stomach cancer Neg Hx    Rectal cancer Neg Hx     Social History:  reports that she has been smoking cigarettes. She has a 39.00 pack-year smoking history. She has been exposed to tobacco smoke. She has never used smokeless tobacco. She reports current drug use. Drug: Marijuana. She  reports that she does not drink alcohol.  Physical Exam: BP 125/67   Pulse 71   Temp (!) 97 F (36.1 C) (Temporal)   Resp 16   LMP 12/01/2017   SpO2 98%    Constitutional:  Alert and oriented, No acute distress. Cardiovascular: Regular rate and rhythm Respiratory: Clear to auscultation bilaterally GI: Abdomen is soft, nontender, nondistended, no abdominal masses   Laboratory Data: Urine culture 4/17 with E. coli, treated with culture appropriate nitrofurantoin   Assessment & Plan:   57 year old female who presented with gross hematuria and had a very large bladder tumor, initial resection on 06/07/2022 showed high-grade T1 urothelial cell carcinoma, muscle was present and not involved with tumor.  Here today for second look TURBT.  We discussed transurethral resection of bladder tumor (TURBT) and risks and benefits at length. This is typically a 1 to 2-hour procedure done under general anesthesia in the operating room.  A scope is inserted through the urethra and used to resect abnormal tissue within the bladder, which is then sent to the pathologist to determine grade and stage of the tumor.  Risks include bleeding, infection, need for temporary Foley placement, and bladder perforation.  Treatment strategies are based on the type of tumor and depth of invasion.  We briefly reviewed the different  treatment pathways for non-muscle invasive and muscle invasive bladder cancer.  Second look TURBT today  Legrand Rams, MD 07/26/2022  [facility] [address] [place], [place] [address] [phone]

## 2022-07-26 NOTE — Anesthesia Preprocedure Evaluation (Signed)
Anesthesia Evaluation  Patient identified by MRN, date of birth, ID band Patient awake    Reviewed: Allergy & Precautions, NPO status , Patient's Chart, lab work & pertinent test results  Airway Mallampati: III  TM Distance: >3 FB Neck ROM: full    Dental  (+) Upper Dentures, Lower Dentures   Pulmonary asthma , sleep apnea and Oxygen sleep apnea , COPD,  COPD inhaler, Current Smoker and Patient abstained from smoking.    + decreased breath sounds      Cardiovascular Exercise Tolerance: Good hypertension, Pt. on medications + CAD  Normal cardiovascular exam Rhythm:Regular Rate:Normal     Neuro/Psych  Headaches PSYCHIATRIC DISORDERS Anxiety Depression       GI/Hepatic Neg liver ROS,GERD  Medicated,,  Endo/Other  diabetes, Type 2  On GLP1 agonist trulicity, last taken 11 days ago  Renal/GU      Musculoskeletal  (+) Arthritis ,    Abdominal Normal abdominal exam  (+)   Peds negative pediatric ROS (+)  Hematology negative hematology ROS (+)   Anesthesia Other Findings Past Medical History: No date: Anxiety No date: Arthritis No date: Asthma No date: Back pain No date: COPD (chronic obstructive pulmonary disease) (HCC) No date: Depression No date: Diabetes mellitus No date: Headache No date: History of COVID-19 No date: Hyperlipidemia No date: Hypertension No date: Neuromuscular disorder (HCC) No date: Sleep apnea No date: Tendonitis  Past Surgical History: No date: CESAREAN SECTION No date: CESAREAN SECTION     Comment:  3 times No date: COLONOSCOPY No date: cyst on neck     Comment:  as a baby/right side  BMI    Body Mass Index: 24.58 kg/m      Reproductive/Obstetrics negative OB ROS                              Anesthesia Physical Anesthesia Plan  ASA: 3  Anesthesia Plan: General   Post-op Pain Management: Ofirmev IV (intra-op)*   Induction: Intravenous  PONV  Risk Score and Plan: 3 and Ondansetron, Dexamethasone, Midazolam and Treatment may vary due to age or medical condition  Airway Management Planned: Oral ETT  Additional Equipment: None  Intra-op Plan:   Post-operative Plan: Extubation in OR  Informed Consent: I have reviewed the patients History and Physical, chart, labs and discussed the procedure including the risks, benefits and alternatives for the proposed anesthesia with the patient or authorized representative who has indicated his/her understanding and acceptance.     Dental Advisory Given  Plan Discussed with: CRNA and Surgeon  Anesthesia Plan Comments: (Discussed risks of anesthesia with patient, including PONV, sore throat, lip/dental/eye damage. Rare risks discussed as well, such as cardiorespiratory and neurological sequelae, and allergic reactions. Discussed the role of CRNA in patient's perioperative care. Patient understands.)        Anesthesia Quick Evaluation

## 2022-07-26 NOTE — Op Note (Signed)
Date of procedure: 07/26/22  Preoperative diagnosis:  Bladder cancer  Postoperative diagnosis:  Same  Procedure: Cystoscopy, bladder biopsy and fulguration  Surgeon: Legrand Rams, MD  Anesthesia: General  Complications: None  Intraoperative findings:  Bladder overall looked excellent with no definite residual tumor seen, ureteral orifices orthotopic bilaterally Minimal erythema at the right posterior bladder wall and left lateral wall, biopsied and fulgurated in its entirety  EBL: Minimal  Specimens: Bladder biopsy  Drains: None  Indication: Brandi Calderon is a 57 y.o. patient with gross hematuria found to have extensive 5+ cm of bladder tumor and originally underwent large TURBT on 06/07/2022 with pathology showing high-grade T1 urothelial cell carcinoma.  Here today for second look TURBT.[name] Kitchen  After reviewing the management options for treatment, they elected to proceed with the above surgical procedure(s). We have discussed the potential benefits and risks of the procedure, side effects of the proposed treatment, the likelihood of the patient achieving the goals of the procedure, and any potential problems that might occur during the procedure or recuperation. Informed consent has been obtained.  Description of procedure:  The patient was taken to the operating room and general anesthesia was induced. SCDs were placed for DVT prophylaxis. The patient was placed in the dorsal lithotomy position, prepped and draped in the usual sterile fashion, and preoperative antibiotics(gentamicin) were administered. A preoperative time-out was performed.   A 21 French rigid cystoscope was used to intubate the urethra and thorough cystoscopy was performed.  There was some extensive scar throughout the bladder from prior resection, but no evidence of residual definitive papillary tumor.  Ureteral orifices were orthotopic bilaterally.  There was some mild erythema at the right posterior bladder  wall, as well as the left lateral wall.  The cold cup biopsy forcep was used to remove all abnormal appearing tissue at the posterior bladder wall and the left lateral wall, and these areas were fulgurated extensively with the Bugbee.  Each area measured approximately 1 cm each.  Ureteral orifices remained intact, no bleeding noted with the bladder decompressed.  Disposition: Stable to PACU  Plan: Follow-up pathology, if no evidence of muscle invasive disease anticipate induction and maintenance BCG x 3 years with surveillance cystoscopy Counseled extensively on smoking cessation and risk of recurrence  Legrand Rams, MD

## 2022-07-26 NOTE — Anesthesia Procedure Notes (Signed)
Procedure Name: Intubation Date/Time: 07/26/2022 11:15 AM  Performed by: Rodney Booze, CRNAPre-anesthesia Checklist: Patient identified, Emergency Drugs available, Suction available and Patient being monitored Patient Re-evaluated:Patient Re-evaluated prior to induction Oxygen Delivery Method: Circle system utilized Preoxygenation: Pre-oxygenation with 100% oxygen Induction Type: IV induction Ventilation: Mask ventilation without difficulty Laryngoscope Size: Miller and 2 Grade View: Grade I Tube type: Oral Tube size: 7.0 mm Number of attempts: 1 Airway Equipment and Method: Stylet and Oral airway Placement Confirmation: ETT inserted through vocal cords under direct vision, positive ETCO2 and breath sounds checked- equal and bilateral Secured at: 21 cm Tube secured with: Tape Dental Injury: Teeth and Oropharynx as per pre-operative assessment

## 2022-07-26 NOTE — Transfer of Care (Signed)
Immediate Anesthesia Transfer of Care Note  Patient: Brandi Calderon  Procedure(s) Performed: CYSTOSCOPY WITH BLADDER BIOPSY (Bladder)  Patient Location: PACU  Anesthesia Type:General  Level of Consciousness: awake, alert , and oriented  Airway & Oxygen Therapy: Patient Spontanous Breathing  Post-op Assessment: Report given to RN and Post -op Vital signs reviewed and stable  Post vital signs: Reviewed and stable  Last Vitals:  Vitals Value Taken Time  BP 138/80   Temp    Pulse 90 07/26/22 1150  Resp 15 07/26/22 1150  SpO2 100 % 07/26/22 1150  Vitals shown include unvalidated device data.  Last Pain:  Vitals:   07/26/22 0904  TempSrc: Temporal  PainSc: 0-No pain         Complications: No notable events documented.

## 2022-07-26 NOTE — Anesthesia Postprocedure Evaluation (Signed)
Anesthesia Post Note  Patient: Brandi Calderon  Procedure(s) Performed: CYSTOSCOPY WITH BLADDER BIOPSY (Bladder)  Patient location during evaluation: PACU Anesthesia Type: General Level of consciousness: awake and alert Pain management: pain level controlled Vital Signs Assessment: post-procedure vital signs reviewed and stable Respiratory status: spontaneous breathing, nonlabored ventilation, respiratory function stable and patient connected to nasal cannula oxygen Cardiovascular status: blood pressure returned to baseline and stable Postop Assessment: no apparent nausea or vomiting Anesthetic complications: no   No notable events documented.   Last Vitals:  Vitals:   07/26/22 1220 07/26/22 1232  BP: 118/60   Pulse: 80 67  Resp: 16 16  Temp:  36.7 C  SpO2: 96% 100%    Last Pain:  Vitals:   07/26/22 1232  TempSrc: Temporal  PainSc: 0-No pain                 Corinda Gubler

## 2022-07-26 NOTE — Discharge Instructions (Signed)
AMBULATORY SURGERY  ?DISCHARGE INSTRUCTIONS ? ? ?The drugs that you were given will stay in your system until tomorrow so for the next 24 hours you should not: ? ?Drive an automobile ?Make any legal decisions ?Drink any alcoholic beverage ? ? ?You may resume regular meals tomorrow.  Today it is better to start with liquids and gradually work up to solid foods. ? ?You may eat anything you prefer, but it is better to start with liquids, then soup and crackers, and gradually work up to solid foods. ? ? ?Please notify your doctor immediately if you have any unusual bleeding, trouble breathing, redness and pain at the surgery site, drainage, fever, or pain not relieved by medication. ? ? ? ?Additional Instructions: ? ? ? ?Please contact your physician with any problems or Same Day Surgery at [phone], Monday through Friday 6 am to 4 pm, or [place] [facility] Main number at [phone].  ?

## 2022-07-29 LAB — SURGICAL PATHOLOGY

## 2022-07-30 ENCOUNTER — Ambulatory Visit: Payer: Self-pay | Admitting: *Deleted

## 2022-07-30 NOTE — Patient Outreach (Signed)
  Care Coordination   07/30/2022 Name: Brandi Calderon MRN: [id number] DOB: Jul 25, 1965   Care Coordination Outreach Attempts:  An unsuccessful telephone outreach was attempted for a scheduled appointment today.  Follow Up Plan:  Additional outreach attempts will be made to offer the patient care coordination information and services.   Encounter Outcome:  No Answer   Care Coordination Interventions:  No, not indicated    Kemper Durie, RN, MSN, [facility] Care Management Care Management Coordinator [phone]

## 2022-08-01 ENCOUNTER — Other Ambulatory Visit: Payer: Self-pay | Admitting: Internal Medicine

## 2022-08-01 DIAGNOSIS — R809 Proteinuria, unspecified: Secondary | ICD-10-CM | POA: Diagnosis not present

## 2022-08-01 DIAGNOSIS — E1129 Type 2 diabetes mellitus with other diabetic kidney complication: Secondary | ICD-10-CM | POA: Diagnosis not present

## 2022-08-01 LAB — HEMOGLOBIN A1C: Hemoglobin A1C: 7.6

## 2022-08-01 NOTE — Telephone Encounter (Signed)
Requested Prescriptions  Pending Prescriptions Disp Refills   fluticasone (FLONASE) 50 MCG/ACT nasal spray [Pharmacy Med Name: FLUTICASONE PROP 50 MCG SPRAY] 48 mL 0    Sig: SPRAY 2 SPRAYS INTO EACH NOSTRIL EVERY DAY     Ear, Nose, and Throat: Nasal Preparations - Corticosteroids Passed - 08/01/2022  9:02 AM      Passed - Valid encounter within last 12 months    Recent Outpatient Visits           1 month ago Type 2 diabetes mellitus with hyperglycemia, without long-term current use of insulin [facility])   [place] [facility] Delles, Gentry Fitz A, RPH-CPP   2 months ago Abnormal uterine bleeding   [place] [facility] [place], [place] W, NP   2 months ago Type 2 diabetes mellitus with hyperglycemia, without long-term current use of insulin [facility])   [place] [facility] Delles, Gentry Fitz A, RPH-CPP   4 months ago Encounter for general adult medical examination with abnormal findings   [place] [facility] [place], [place] W, NP   5 months ago Type 2 diabetes mellitus with other specified complication, without long-term current use of insulin [facility])   [place] [address] [facility] Delles, Jackelyn Poling, RPH-CPP       Future Appointments             In 6 days Richardo Hanks, Laurette Schimke, MD [facility] [place]   In 1 month [place], Salvadore Oxford, NP [place] [facility], [facility]

## 2022-08-07 ENCOUNTER — Encounter: Payer: Self-pay | Admitting: Urology

## 2022-08-07 ENCOUNTER — Ambulatory Visit (INDEPENDENT_AMBULATORY_CARE_PROVIDER_SITE_OTHER): Payer: Medicare HMO | Admitting: Urology

## 2022-08-07 VITALS — BP 123/71 | HR 83 | Ht 61.0 in | Wt 130.0 lb

## 2022-08-07 DIAGNOSIS — N3281 Overactive bladder: Secondary | ICD-10-CM | POA: Diagnosis not present

## 2022-08-07 DIAGNOSIS — C672 Malignant neoplasm of lateral wall of bladder: Secondary | ICD-10-CM

## 2022-08-07 MED ORDER — OXYBUTYNIN CHLORIDE ER 15 MG PO TB24: 15.0000 mg | ORAL_TABLET | Freq: Every day | ORAL | 3 refills | Status: DC

## 2022-08-07 NOTE — Progress Notes (Signed)
   08/07/2022 11:28 AM   Brandi Calderon 1965-08-02 [id number]  Reason for visit: Bladder cancer, discuss second look TURBT pathology, OAB  HPI: 57 year old female who presented with extensive bladder tumors measuring 5 cm and 7 cm and originally underwent TURBT on 06/07/2022 and pathology showed HG urothelial cell carcinoma with focal areas suspicious for lamina propria invasion(HG T1), and muscle was present and not involved with tumor.  She underwent a second look TURBT on 07/26/2022 and the bladder overall looked excellent with no residual tumor, multiple biopsies were taken at the prior site of resection and were all benign.  We reviewed the AUA guidelines regarding treatment options for patients with nonmuscle invasive bladder cancer.  We focused on smoking cessation at length, and she is amenable to trying to quit.  We also discussed the role of BCG, and I recommended induction BCG followed by 2 to 3 years of maintenance with close surveillance cystoscopy every 3 months for the first 2 years.  Risks and benefits of BCG reviewed at length.  We discussed the increased risk of recurrence if she continues smoking.  Regarding her OAB symptoms, she was having severe urgency, frequency, and urge incontinence even prior to her original TURBT, currently she is on oxybutynin 15 mg XL daily with excellent results, and urinary symptoms have improved significantly.  Schedule induction BCG in the next 3 to 5 weeks, followed by surveillance cystoscopy in 3 months, plan for maintenance BCG 2 to 3 years total Oxybutynin 15 mg XL daily refilled RTC 3 months initial surveillance cystoscopy   [name], MD  [facility] [address] [place], [place] [address] [phone]

## 2022-08-07 NOTE — Patient Instructions (Signed)
Bladder Cancer  Bladder cancer is a condition where abnormal tissue (a tumor) grows in the bladder. The bladder is the organ that holds urine. Two tubes (ureters) carry urine from the kidneys to the bladder. The bladder wall is made of layers of tissue. Cancer that spreads through these layers of the bladder wall becomes more difficult to treat. What increases the risk? The following factors may make you more likely to develop this condition: Smoking. Working where there are risks (occupational exposures), such as working with rubber, leather, clothing fabric, dyes, chemicals, or paint. Being 55 years of age or older. Being female. Having long-term bladder inflammation. Having a history of cancer. This includes: A family history of bladder cancer. Having had bladder cancer before. Having had certain treatments for cancer before, such as: Medicines to kill cancer cells (chemotherapy). Strong X-ray beams or high-energy capsules to kill cancer cells and shrink tumors (radiation therapy). Having been exposed to arsenic. This is a poisonous substance. What are the signs or symptoms? Early symptoms of this condition include: Blood in your urine. Pain when urinating. Infections of your urinary system (urinary tract infections or UTIs) that happen often. Having to urinate sooner or more often than normal. Late symptoms of this condition include: Not being able to urinate. Pain on one side of your lower back. Loss of appetite. Weight loss. Tiredness (fatigue). Swelling in your feet. Bone pain. How is this diagnosed? This condition is diagnosed based on: Your medical history. A physical exam. Lab tests, such as urine tests. Imaging tests. Your symptoms. You may also have other tests or procedures, such as: A cystoscopy. This involves putting a narrow tube into your urethra. The urethra is the organ that carries urine from your bladder to the outside of your body. This procedure is done to  view the lining of your bladder for tumors. A biopsy. This involves removing a tissue sample to look at under a microscope to check for cancer. Blood tests or imaging tests may be needed. These show how far into the bladder wall cancer has grown, and if cancer has spread to any other parts of your body. Tests may include: CT scan. MRI. Bone scan. X-ray. How is this treated? Your health care provider may recommend one or more types of treatment based on the stage of your cancer. The most common treatments are: Surgery to remove the cancer. Types of surgeries include: Removing a tumor on the inside wall of the bladder (transurethral resection). Removing the bladder (cystectomy). Radiation therapy. This is often combined with chemotherapy. Chemotherapy. Immunotherapy. This uses medicines to help your body's disease-fighting system (immune system) destroy cancer cells. Follow these instructions at home: Take over-the-counter and prescription medicines only as told by your health care provider. If you were prescribed an antibiotic medicine, take it as told by your health care provider. Do not stop using the antibiotic even if you start to feel better. Eat a healthy diet. Some treatments might affect your appetite. Do not use any products that contain nicotine or tobacco. These products include cigarettes, chewing tobacco, and vaping devices, such as e-cigarettes. If you need help quitting, ask your health care provider. Consider joining a support group. This may help you learn to deal with the stress of having bladder cancer. Tell your cancer care team if you develop side effects. Your team may be able to recommend ways to get relief. Keep all follow-up visits. This is important. Where to find more information American Cancer Society (ACS): cancer.org National   [place]): cancer.gov Contact a health care provider if: You have symptoms of a UTI. These  include: Fever. Chills. Weakness. Muscle aches. Pain in your abdomen. Urge to urinate that is stronger and happens more often than normal. Burning in the bladder or urethra when you urinate. Get help right away if: There is blood in your urine. You cannot urinate. You have severe pain or other symptoms that do not go away. Summary Bladder cancer is a condition where tumors grow in the bladder. Diagnosis is based on your medical history, a physical exam, lab tests, imaging tests, and your symptoms. Your health care provider may recommend one or more types of treatment based on the stage of your cancer. Consider joining a support group. This may help you learn to deal with the stress of having bladder cancer. This information is not intended to replace advice given to you by your health care provider. Make sure you discuss any questions you have with your health care provider. Document Revised: 02/26/2021 Document Reviewed: 02/26/2021 Elsevier Patient Education  [place] Calmette-Guerin Live, BCG intravesical solution What is this medication? BACILLUS CALMETTE-GUERIN LIVE, BCG (ba SIL [place]  KAL met  gay RAYN) is a bacteria solution. This medicine stimulates the immune system to ward off cancer cells. It is used to treat bladder cancer. This medicine may be used for other purposes; ask your health care provider or pharmacist if you have questions. This medicine may be used for other purposes; ask your health care provider or pharmacist if you have questions. COMMON BRAND NAME(S): Theracys, TICE BCG What should I tell my care team before I take this medication? They need to know if you have any of these conditions: aneurysm blood in the urine bladder biopsy within 2 weeks fever or infection immune system problems leukemia lymphoma myasthenia gravis need organ transplant prosthetic device like arterial graft, artificial joint, prosthetic heart valve recent or  ongoing radiation therapy tuberculosis an unusual or allergic reaction to Bacillus Calmette-Guerin Live, BCG, latex, other medicines, foods, dyes, or preservatives pregnant or trying to get pregnant breast-feeding How should I use this medication? This drug is given as a catheter infusion into the bladder. It is administered in a hospital or clinic by a specially trained health care provider. You will be given directions to follow before the treatment. Follow your health care provider's directions carefully. This medicine contains live bacteria. It is very important to follow these directions closely after treatment to prevent others from coming in contact with your urine. Your health care provider may give you additional directions to follow. Try to hold this medicine in your bladder for 1 to 2 hours. Follow these directions the first time you go to the bathroom and for 6 hours after the first void. Wash your hands before using the restroom. After voiding, wash your hands and genital area. Use a toilet and sit when going to the bathroom. This helps to prevent the urine from splashing. Do not use public toilets or void outside. After each void, add 2 cups of undiluted bleach to the toilet. Close the lid. Wait 15 to 20 minutes and then flush the toilet. After the first void, drink more fluids to help dilute your urine. If you have urinary incontinence, wash the clothes you were wearing in the washer immediately. Do not wash other clothes at the same time. If you are wearing an incontinence pad, pour bleach on the pad and allow it to soak into the  pad before throwing it away. Put the pad in a plastic bag and put it in the trash. Talk to your pediatrician regarding the use of this medicine in children. Special care may be needed. Overdosage: If you think you have taken too much of this medicine contact a poison control center or emergency room at once. NOTE: This medicine is only for you. Do not share  this medicine with others. Overdosage: If you think you have taken too much of this medicine contact a poison control center or emergency room at once. NOTE: This medicine is only for you. Do not share this medicine with others. What if I miss a dose? It is important not to miss your dose. Call your doctor or health care professional if you are unable to keep an appointment. What may interact with this medication? antibiotics medicines to suppress your immune system like chemotherapy agents or corticosteroids medicine to treat tuberculosis This list may not describe all possible interactions. Give your health care provider a list of all the medicines, herbs, non-prescription drugs, or dietary supplements you use. Also tell them if you smoke, drink alcohol, or use illegal drugs. Some items may interact with your medicine. This list may not describe all possible interactions. Give your health care provider a list of all the medicines, herbs, non-prescription drugs, or dietary supplements you use. Also tell them if you smoke, drink alcohol, or use illegal drugs. Some items may interact with your medicine. What should I watch for while using this medication? Visit your health care provider for checks on your progress. This medicine may make you feel generally unwell. Contact your health care provider if your symptoms last more than 2 days or if they get worse. Call your health care provider right away if you have a severe or unusual symptom. Infection can be spread to others through contact with this medicine. To prevent the spread of infection, follow your health care provider's directions carefully after treatment. Do not become pregnant while taking this medicine. There is a potential for serious side effects to an unborn child. Talk to your health care provider for more information. Do not breast-feed an infant while taking this medicine. If you have sex while on this medicine, use a condom. Ask your  health care provider how long you should use a condom. What side effects may I notice from receiving this medication? Side effects that you should report to your doctor or health care professional as soon as possible: allergic reactions like skin rash, itching or hives, swelling of the face, lips, or tongue signs of infection - fever or chills, cough, sore throat, pain or difficulty passing urine signs of decreased red blood cells - unusually weak or tired, fainting spells, lightheadedness blood in urine breathing problems cough eye pain, redness flu-like symptoms joint pain bladder-area pain for more than 2 days after treatment trouble passing urine or change in the amount of urine vomiting yellowing of the eyes or skin Side effects that usually do not require medical attention (report to your doctor or health care professional if they continue or are bothersome): bladder spasm burning when passing urine within 2 days of treatment feel need to pass urine often or wake up at night to pass urine loss of appetite This list may not describe all possible side effects. Call your doctor for medical advice about side effects. You may report side effects to FDA at 1-800-FDA-1088. This list may not describe all possible side effects. Call your doctor for   medical advice about side effects. You may report side effects to FDA at 1-800-FDA-1088. Where should I keep my medication? This drug is given in a hospital or clinic and will not be stored at home. NOTE: This sheet is a summary. It may not cover all possible information. If you have questions about this medicine, talk to your doctor, pharmacist, or health care provider.  2023 Elsevier/Gold Standard (2018-04-10 00:00:00)   Steps to Quit Smoking Smoking tobacco is the leading cause of preventable death. It can affect almost every organ in the body. Smoking puts you and those around you at risk for developing many serious chronic  diseases. Quitting smoking can be very challenging. Do not get discouraged if you are not successful the first time. Some people need to make many attempts to quit before they achieve long-term success. Do your best to stick to your quit plan, and talk with your health care provider if you have any questions or concerns. How do I get ready to quit? When you decide to quit smoking, create a plan to help you succeed. Before you quit: Pick a date to quit. Set a date within the next 2 weeks to give you time to prepare. Write down the reasons why you are quitting. Keep this list in places where you will see it often. Tell your family, friends, and co-workers that you are quitting. Support from people you are close to can make quitting easier. Talk with your health care provider about your options for quitting smoking. Find out what treatment options are covered by your health insurance. Identify people, places, things, and activities that make you want to smoke (triggers). Avoid them. What first steps can I take to quit smoking? Throw away all cigarettes at home, at work, and in your car. Throw away smoking accessories, such as Set designer. Clean your car. Make sure to empty the ashtray. Clean your home, including curtains and carpets. What strategies can I use to quit smoking? Talk with your health care provider about combining strategies, such as taking medicines while you are also receiving in-person counseling. Using these two strategies together makes you more likely to succeed in quitting than if you used either strategy on its own. If you are pregnant or breastfeeding, talk with your health care provider about finding counseling or other support strategies to quit smoking. Do not take medicine to help you quit smoking unless your health care provider tells you to. Quit right away Quit smoking completely, instead of gradually reducing how much you smoke over a period of time. Stopping  smoking right away may be more successful than gradually quitting. Attend in-person counseling to help you build problem-solving skills. You are more likely to succeed in quitting if you attend counseling sessions regularly. Even short sessions of 10 minutes can be effective. Take medicine You may take medicines to help you quit smoking. Some medicines require a prescription. You can also purchase over-the-counter medicines. Medicines may have nicotine in them to replace the nicotine in cigarettes. Medicines may: Help to stop cravings. Help to relieve withdrawal symptoms. Your health care provider may recommend: Nicotine patches, gum, or lozenges. Nicotine inhalers or sprays. Non-nicotine medicine that you take by mouth. Find resources Find resources and support systems that can help you quit smoking and remain smoke-free after you quit. These resources are most helpful when you use them often. They include: Online chats with a Veterinary surgeon. Telephone quitlines. Printed Materials engineer. Support groups or group counseling. Text messaging programs.  Mobile phone apps or applications. Use apps that can help you stick to your quit plan by providing reminders, tips, and encouragement. Examples of free services include Quit Guide from the CDC and smokefree.gov  What can I do to make it easier to quit?  Reach out to your family and friends for support and encouragement. Call telephone quitlines, such as 1-800-QUIT-NOW, reach out to support groups, or work with a counselor for support. Ask people who smoke to avoid smoking around you. Avoid places that trigger you to smoke, such as bars, parties, or smoke-break areas at work. Spend time with people who do not smoke. Lessen the stress in your life. Stress can be a smoking trigger for some people. To lessen stress, try: Exercising regularly. Doing deep-breathing exercises. Doing yoga. Meditating. What benefits will I see if I quit smoking? Over  time, you should start to see positive results, such as: Improved sense of smell and taste. Decreased coughing and sore throat. Slower heart rate. Lower blood pressure. Clearer and healthier skin. The ability to breathe more easily. Fewer sick days. Summary Quitting smoking can be very challenging. Do not get discouraged if you are not successful the first time. Some people need to make many attempts to quit before they achieve long-term success. When you decide to quit smoking, create a plan to help you succeed. Quit smoking right away, not slowly over a period of time. Find resources and support systems that can help you quit smoking and remain smoke-free after you quit. This information is not intended to replace advice given to you by your health care provider. Make sure you discuss any questions you have with your health care provider. Document Revised: 03/09/2021 Document Reviewed: 03/09/2021 Elsevier Patient Education  2023 [employer].

## 2022-08-12 ENCOUNTER — Other Ambulatory Visit: Payer: Self-pay | Admitting: Internal Medicine

## 2022-08-12 NOTE — Telephone Encounter (Signed)
Requested Prescriptions  Pending Prescriptions Disp Refills   furosemide (LASIX) 40 MG tablet [Pharmacy Med Name: FUROSEMIDE 40 MG TABLET] 90 tablet 0    Sig: TAKE 1 TABLET BY MOUTH EVERY DAY     Cardiovascular:  Diuretics - Loop Failed - 08/12/2022  2:19 AM      Failed - Mg Level in normal range and within 180 days    Magnesium  Date Value Ref Range Status  09/05/2016 1.4 (L) 1.7 - 2.4 mg/dL Final         Passed - K in normal range and within 180 days    Potassium  Date Value Ref Range Status  07/18/2022 4.0 3.5 - 5.1 mmol/L Final  12/07/2012 3.4 (L) 3.5 - 5.1 mmol/L Final         Passed - Ca in normal range and within 180 days    Calcium  Date Value Ref Range Status  07/18/2022 9.3 8.9 - 10.3 mg/dL Final   Calcium, Total  Date Value Ref Range Status  12/07/2012 8.6 8.5 - 10.1 mg/dL Final   Calcium, Ion  Date Value Ref Range Status  05/09/2010 1.11 (L) 1.12 - 1.32 mmol/L Final         Passed - Na in normal range and within 180 days    Sodium  Date Value Ref Range Status  07/18/2022 142 135 - 145 mmol/L Final  06/25/2017 139 134 - 144 mmol/L Final  12/07/2012 134 (L) 136 - 145 mmol/L Final         Passed - Cr in normal range and within 180 days    Creat  Date Value Ref Range Status  03/14/2022 0.71 0.50 - 1.03 mg/dL Final   Creatinine, Ser  Date Value Ref Range Status  07/18/2022 0.61 0.44 - 1.00 mg/dL Final         Passed - Cl in normal range and within 180 days    Chloride  Date Value Ref Range Status  07/18/2022 106 98 - 111 mmol/L Final  12/07/2012 103 98 - 107 mmol/L Final         Passed - Last BP in normal range    BP Readings from Last 1 Encounters:  08/07/22 123/71         Passed - Valid encounter within last 6 months    Recent Outpatient Visits           1 month ago Type 2 diabetes mellitus with hyperglycemia, without long-term current use of insulin [facility])   [place] [facility] Delles, Gentry Fitz A, RPH-CPP   3 months  ago Abnormal uterine bleeding   [place] [facility] [place], [place] W, NP   3 months ago Type 2 diabetes mellitus with hyperglycemia, without long-term current use of insulin [facility])   [place] [facility] [place] Delles, Jackelyn Poling, RPH-CPP   5 months ago Encounter for general adult medical examination with abnormal findings   [place] [facility] [place], Salvadore Oxford, NP   6 months ago Type 2 diabetes mellitus with other specified complication, without long-term current use of insulin (HCC)   [place] [facility] Delles, Jackelyn Poling, RPH-CPP       Future Appointments             In 1 month [name], Salvadore Oxford, NP  [facility], [facility]

## 2022-08-13 DIAGNOSIS — E785 Hyperlipidemia, unspecified: Secondary | ICD-10-CM | POA: Diagnosis not present

## 2022-08-13 DIAGNOSIS — E1169 Type 2 diabetes mellitus with other specified complication: Secondary | ICD-10-CM | POA: Diagnosis not present

## 2022-08-13 DIAGNOSIS — R809 Proteinuria, unspecified: Secondary | ICD-10-CM | POA: Diagnosis not present

## 2022-08-13 DIAGNOSIS — F17200 Nicotine dependence, unspecified, uncomplicated: Secondary | ICD-10-CM | POA: Diagnosis not present

## 2022-08-13 DIAGNOSIS — E1142 Type 2 diabetes mellitus with diabetic polyneuropathy: Secondary | ICD-10-CM | POA: Diagnosis not present

## 2022-08-13 DIAGNOSIS — E1129 Type 2 diabetes mellitus with other diabetic kidney complication: Secondary | ICD-10-CM | POA: Diagnosis not present

## 2022-08-19 ENCOUNTER — Ambulatory Visit: Payer: Medicare HMO | Admitting: Pharmacist

## 2022-08-19 DIAGNOSIS — F17200 Nicotine dependence, unspecified, uncomplicated: Secondary | ICD-10-CM

## 2022-08-19 DIAGNOSIS — J418 Mixed simple and mucopurulent chronic bronchitis: Secondary | ICD-10-CM

## 2022-08-19 DIAGNOSIS — E1165 Type 2 diabetes mellitus with hyperglycemia: Secondary | ICD-10-CM

## 2022-08-19 NOTE — Progress Notes (Unsigned)
08/19/2022 Name: Brandi Calderon MRN: [id number] DOB: 05/25/1965  Chief Complaint  Patient presents with   Medication Management   Medication Adherence    Brandi Calderon is a 57 y.o. year old female who presented for a telephone visit.   They were referred to the pharmacist by their PCP for assistance in managing diabetes, hypertension, and hyperlipidemia.    Subjective:  Care Team: Primary Care Provider: Lorre Munroe, NP; Next Scheduled Visit: 09/13/2022 Pulmonologist: [facility]; Next Scheduled Visit: 06/10/2022 Endocrinologist: Dr. Tedd Sias; Next Scheduled Visit: 08/08/2022 (labs 5/2)  Urologist: Carman Ching, PA-C; Upcoming procedure on 07/26/2022 (PreAdmission on 4/17)  Medication Access/Adherence  Current Pharmacy:  [id number] - [place], [place] - [address] [place] [address] Phone: [phone] Fax: [phone]   Patient reports affordability concerns with their medications: No  Patient reports access/transportation concerns to their pharmacy: No  Patient reports adherence concerns with their medications:  No       Patient currently using weekly pillbox to organize her medications - Interested in discussing pill packaging through [employer] again in future, but reports doing well with weekly pillbox for now     Diabetes: Patient followed by [facility] for management of T2DM From review of note from latest visit with Endocrinology on 5/14, provider advised "Will discontinue Trulicity and replace with Ozempic 2 mg weekly"   Current medications:  Metformin 1000 mg twice daily Ozempic 2 mg weekly on Mondays (reports switching back to Ozempic from Trulicity today as advised by Endocrinologist) Jardiance 25 mg daily glipizide ER 5 mg daily  Medications tried in the past: Trulicity   Recalls recent morning fasting blood sugars ranging 120-140  Reports has reduced soda consumption  to ~12 oz/day   Denies symptoms of hypoglycemia   Statin: atorvastatin 80 mg daily   ACE-I: lisinopril 5 mg daily   COPD:   Current medications: Trelegy - 1 puff daily Albuterol - 1-2 puffs every 6 hours as needed Overnight oxygen   Reports using Trelegy consistently and rinsing mouth after each use and using rescue (albuterol) inhaler as needed as directed   Reports doing well with albuterol inhaler now that she is following cleaning instruction for plastic portion of inhaler device      Tobacco Abuse: Reports has reduced smoking to ~1 pack/day and working on taper back more Denies interest in setting quit date at this time as feels like cutting back gradually is a better technique for her Reports family supporting her with plan to quit smoking Triggers: boredom, others smoking around her, stress   Objective:  Lab Results  Component Value Date   HGBA1C 7.1 (H) 03/14/2022    Lab Results  Component Value Date   CREATININE 0.61 07/18/2022   BUN 12 07/18/2022   NA 142 07/18/2022   K 4.0 07/18/2022   CL 106 07/18/2022   CO2 29 07/18/2022    Lab Results  Component Value Date   CHOL 170 03/14/2022   HDL 44 (L) 03/14/2022   LDLCALC 103 (H) 03/14/2022   TRIG 132 03/14/2022   CHOLHDL 3.9 03/14/2022   BP Readings from Last 3 Encounters:  08/07/22 123/71  07/26/22 118/60  06/12/22 108/69   Pulse Readings from Last 3 Encounters:  08/07/22 83  07/26/22 67  06/12/22 73     Medications Reviewed Today     Reviewed by Debbe Bales, CMA (Certified Medical Assistant) on 08/07/22 at 1115  Med List Status: <  None>   Medication Order Taking? Sig Documenting Provider Last Dose Status Informant  albuterol (VENTOLIN HFA) 108 (90 Base) MCG/ACT inhaler [id number] Yes INHALE 1-2 PUFFS BY MOUTH EVERY 6 HOURS AS NEEDED FOR WHEEZE OR SHORTNESS OF BREATH  Patient taking differently: 1-2 puffs every 6 (six) hours as needed.   Lorre Munroe, NP Taking Active    Aspirin-Salicylamide-Caffeine [facility] HEADACHE POWDER PO) [id number] Yes Take 4 packets by mouth daily as needed (pain). [provider] Taking Active Self  atorvastatin (LIPITOR) 80 MG tablet [id number] Yes Take 80 mg by mouth at bedtime. [provider] Taking Active   Blood Glucose Monitoring Suppl (ONETOUCH VERIO FLEX SYSTEM) w/Device KIT [id number] Yes Use to check blood sugar daily.  DX E11.9 Lorre Munroe, NP Taking Active   clonazePAM (KLONOPIN) 0.5 MG tablet [id number] Yes TAKE 1 TABLET BY MOUTH EVERY DAY AS NEEDED FOR ANXIETY [name], Salvadore Oxford, NP Taking Active   DULoxetine (CYMBALTA) 30 MG capsule [id number] Yes Take 30 mg by mouth at bedtime. [provider] Taking Active   fluticasone (FLONASE) 50 MCG/ACT nasal spray [id number] Yes SPRAY 2 SPRAYS INTO EACH NOSTRIL EVERY DAY [name], Salvadore Oxford, NP Taking Active   furosemide (LASIX) 40 MG tablet [id number] Yes TAKE 1 TABLET BY MOUTH EVERY DAY  Patient taking differently: Take 40 mg by mouth every morning.   Lorre Munroe, NP Taking Active   gabapentin (NEURONTIN) 300 MG capsule [id number] Yes Take 300 mg by mouth at bedtime. [provider] Taking Active Self  glipiZIDE (GLUCOTROL XL) 5 MG 24 hr tablet [id number] Yes Take 5 mg by mouth daily with breakfast. [provider] Taking Active Self  glucose blood test strip [id number] Yes Use as instructed Lorre Munroe, NP Taking Active Self  JARDIANCE 25 MG TABS tablet [id number] Yes Take 25 mg by mouth every morning. [provider] Taking Active Self  Lancets Letta Pate ULTRASOFT) lancets [id number] Yes Use as instructed Lorre Munroe, NP Taking Active Self  lisinopril (ZESTRIL) 5 MG tablet [id number] Yes Take 5 mg by mouth every morning. [provider] Taking Active   metFORMIN (GLUCOPHAGE) 1000 MG tablet [id number] Yes TAKE 1 TABLET (1,000 MG TOTAL) BY MOUTH 2 (TWO) TIMES DAILY WITH A MEAL. Lorre Munroe, NP Taking Active Self  oxybutynin  (DITROPAN XL) 15 MG 24 hr tablet [id number] Yes Take 1 tablet (15 mg total) by mouth daily. [name], MD Taking Active   OXYGEN [id number] Yes Inhale 1 L into the lungs at bedtime. [provider] Taking Active Self  pantoprazole (PROTONIX) 40 MG tablet [id number] Yes Take 1 tablet (40 mg total) by mouth daily as needed (Heartburn). Lorre Munroe, NP Taking Active   polyethylene glycol (MIRALAX) 17 g packet [id number] Yes Take 17 g by mouth daily.  Patient taking differently: Take 17 g by mouth daily as needed.   Georga Hacking, MD Taking Active   TRELEGY ELLIPTA 100-62.5-25 MCG/ACT AEPB [id number] Yes TAKE 1 PUFF BY MOUTH EVERY DAY  Patient taking differently: Inhale 1 puff into the lungs every morning.   Lorre Munroe, NP Taking Active   TRULICITY 4.5 MG/0.5ML Namon Cirri [id number] Yes Inject 4.5 mg into the skin once a week. Mondays [provider] Taking Active Self  Med List Note Valerie Salts, RN 06/25/18 3536): UDS 06-25-2018              Assessment/Plan:   {Pharmacy A/P Choices:26421}  Follow Up Plan: ***  ***

## 2022-08-21 NOTE — Patient Instructions (Signed)
Goals Addressed             This Visit's Progress    Pharmacy Goals       Our goal A1c is less than 7%. This corresponds with fasting sugars less than 130 and 2 hour after meal sugars less than 180. Please check your blood sugar each morning  Our goal bad cholesterol, or LDL, is less than 70 . This is why it is important to continue taking your atorvastatin  Please follow up with the [place] [facility]uit[facility] for their help with quitting smoking.  The [place] [facility] phone number is: [phone]  Feel free to call me with any questions or concerns. I look forward to our next call!  Jourdan Maldonado Marilena Trevathan, PharmD, BCACP Clinical Pharmacist [facility] [place] [phone]        

## 2022-08-23 ENCOUNTER — Other Ambulatory Visit: Payer: Self-pay | Admitting: Internal Medicine

## 2022-08-23 DIAGNOSIS — J449 Chronic obstructive pulmonary disease, unspecified: Secondary | ICD-10-CM | POA: Diagnosis not present

## 2022-08-23 NOTE — Telephone Encounter (Signed)
Requested medications are due for refill today.  yes  Requested medications are on the active medications list.  yes  Last refill. 07/18/2022 #30 0 rf  Future visit scheduled.   yes  Notes to clinic.  Refill not delegated.    Requested Prescriptions  Pending Prescriptions Disp Refills   clonazePAM (KLONOPIN) 0.5 MG tablet [Pharmacy Med Name: CLONAZEPAM 0.5 MG TABLET] 30 tablet 0    Sig: TAKE 1 TABLET BY MOUTH EVERY DAY AS NEEDED FOR ANXIETY     Not Delegated - Psychiatry: Anxiolytics/Hypnotics 2 Failed - 08/23/2022  2:48 PM      Failed - This refill cannot be delegated      Failed - Urine Drug Screen completed in last 360 days      Passed - Patient is not pregnant      Passed - Valid encounter within last 6 months    Recent Outpatient Visits           4 days ago Type 2 diabetes mellitus with hyperglycemia, without long-term current use of insulin (HCC)   [facility] Delles, Gentry Fitz A, RPH-CPP   2 months ago Type 2 diabetes mellitus with hyperglycemia, without long-term current use of insulin [facility])   [place] [facility] Delles, Gentry Fitz A, RPH-CPP   3 months ago Abnormal uterine bleeding   [place] [facility] [place], [place] W, NP   3 months ago Type 2 diabetes mellitus with hyperglycemia, without long-term current use of insulin [facility])   [place] [facility] Delles, Gentry Fitz A, RPH-CPP   5 months ago Encounter for general adult medical examination with abnormal findings   [facility] [place], Salvadore Oxford, NP       Future Appointments     Next 5 Appointments             In 1 week Carman Ching, PA-C [facility] [place]   In 2 weeks Carman Ching, PA-C [facility] [place]   In 3 weeks [place], Salvadore Oxford, NP [facility], [facility]   In 3 weeks Carman Ching, PA-C [facility] [place]    In 1 month Carman Ching, PA-C [facility] [place]         Displaying the next 5 appointments. This patient has additional appointments scheduled.

## 2022-08-30 ENCOUNTER — Ambulatory Visit: Payer: Self-pay | Admitting: *Deleted

## 2022-08-30 NOTE — Patient Outreach (Signed)
  Care Coordination   Follow Up Visit Note   08/30/2022 Name: Brandi Calderon MRN: [id number] DOB: Mar 17, 1966  Brandi Calderon is a 57 y.o. year old female who sees Brandi Calderon, Brandi Oxford, NP for primary care. I spoke with  Brandi Calderon by phone today.  What matters to the patients health and wellness today?  Start treatment for bladder cancer without complications    Goals Addressed             This Visit's Progress    Effective surgery without complications       Care Coordination Interventions: Evaluation of current treatment plan related to transurethral resection of bladder tumor and patient's adherence to plan as established by provider Advised patient to discuss follow up with urology and/or go to urgent care if having trouble with urinating or bleeding Reviewed scheduled/upcoming provider appointments including 6/6 Discussed plans with patient for ongoing care management follow up and provided patient with direct contact information for care management team         SDOH assessments and interventions completed:  No     Care Coordination Interventions:  Yes, provided   Interventions Today    Flowsheet Row Most Recent Value  Chronic Disease   Chronic disease during today's visit Other  [bladder cancer, tumor removed.  Will start treatment on 6/6, will have treatment every Thursday for 6 weeks]  General Interventions   General Interventions Discussed/Reviewed General Interventions Reviewed, Doctor Visits  Doctor Visits Discussed/Reviewed Doctor Visits Reviewed, Specialist  PCP/Specialist Visits Compliance with follow-up visit  [urology appointments every Thursday starting 6/6, PCP on 6/14]  Education Interventions   Education Provided Provided Education  Provided Verbal Education On Medication, When to see the doctor, Blood Sugar Monitoring  [Blood sugas have been 90-150, Trulicity was changed to Ozempic, but she report having GI issues (nausea/vomiting), stopped  taking.  Will advised MD to reorder Trulicity]        Follow up plan: Follow up call scheduled for 6/28    Encounter Outcome:  Pt. Visit Completed   Brandi Durie, RN, MSN, [facility] Care Management Care Management Coordinator [phone]

## 2022-09-04 ENCOUNTER — Other Ambulatory Visit: Payer: Self-pay | Admitting: Internal Medicine

## 2022-09-04 DIAGNOSIS — K219 Gastro-esophageal reflux disease without esophagitis: Secondary | ICD-10-CM

## 2022-09-04 NOTE — Telephone Encounter (Signed)
Requested Prescriptions  Pending Prescriptions Disp Refills   pantoprazole (PROTONIX) 40 MG tablet [Pharmacy Med Name: PANTOPRAZOLE SOD DR 40 MG TAB] 90 tablet 0    Sig: TAKE 1 TABLET (40 MG TOTAL) BY MOUTH DAILY AS NEEDED (HEARTBURN).     Gastroenterology: Proton Pump Inhibitors Passed - 09/04/2022 10:32 AM      Passed - Valid encounter within last 12 months    Recent Outpatient Visits           2 weeks ago Type 2 diabetes mellitus with hyperglycemia, without long-term current use of insulin [facility])   [place] [facility] Delles, Gentry Fitz A, RPH-CPP   2 months ago Type 2 diabetes mellitus with hyperglycemia, without long-term current use of insulin [facility])   [place] [facility] Delles, Gentry Fitz A, RPH-CPP   3 months ago Abnormal uterine bleeding   [place] [facility] [place], [place] W, NP   3 months ago Type 2 diabetes mellitus with hyperglycemia, without long-term current use of insulin [facility])   [place] [facility] Delles, Jackelyn Poling, RPH-CPP   5 months ago Encounter for general adult medical examination with abnormal findings   [place] [facility] [place], Salvadore Oxford, NP       Future Appointments     Next 5 Appointments             Tomorrow Carman Ching, PA-C [facility] [place]   In 1 week Carman Ching, PA-C [facility] [place]   In 1 week [place], Salvadore Oxford, NP [facility]   In 2 weeks Carman Ching, PA-C [facility] [place]   In 3 weeks Carman Ching, PA-C [facility] [place]         Displaying the next 5 appointments. This patient has additional appointments scheduled.

## 2022-09-05 ENCOUNTER — Ambulatory Visit (INDEPENDENT_AMBULATORY_CARE_PROVIDER_SITE_OTHER): Payer: Medicare HMO | Admitting: Physician Assistant

## 2022-09-05 VITALS — BP 106/67 | HR 74 | Ht 61.0 in | Wt 136.0 lb

## 2022-09-05 DIAGNOSIS — C672 Malignant neoplasm of lateral wall of bladder: Secondary | ICD-10-CM

## 2022-09-05 MED ORDER — BCG LIVE 50 MG IS SUSR
3.2400 mL | Freq: Once | INTRAVESICAL | Status: AC
Start: 2022-09-05 — End: 2022-09-05
  Administered 2022-09-05: 81 mg via INTRAVESICAL

## 2022-09-05 NOTE — Progress Notes (Signed)
BCG Bladder Instillation  BCG # 1 of 6  Due to Bladder Cancer patient is present today for a BCG treatment. Patient was cleaned and prepped in a sterile fashion with betadine. A 14FR catheter was inserted, urine return was not noted.  50ml of reconstituted BCG was instilled into the bladder. The catheter was then removed. Patient tolerated well, no complications were noted  Performed by: Carman Ching, PA-C and Humberta Magallon-Mariche, CMA  Additional notes: Patient remained in clinic for 30 minutes following instillation today for monitoring of hypersensitivity reaction; none noted.  We reviewed post-instillation instructions including holding the urine for 2 hours with quarter turns every 15 minutes and pouring bleach into the toilet with subsequent voids for 6 hours. Written instructions also provided today. She expressed understanding.   Follow up: 1 week for BCG #2

## 2022-09-05 NOTE — Patient Instructions (Signed)
Your Timeline for Today:  Right now through 1:10pm: Hold your urine and do your quarter turns every 15 minutes. 1:10pm-7:10pm today: Every time you urinate, pour 1/2 cup of bleach into the toilet and let it sit for 15 minutes prior to flushing. 7:10pm onward: Resume your normal routine.   Patient Education: (BCG) Into the Bladder (Intravesical Chemotherapy)  BCG is a vaccine which is used to prevent tuberculosis (TB).  But it's also a helpful treatment for some early bladder cancers.  When BCG goes directly into the bladder the treatment is described as intravesical.  BCG is a type of immunotherapy.  Immunotherapy stimulates the body's immune system to destroy cancer cells.  How it's given BCG treatment is given to you in an outpatient setting.  It takes a few minutes to administer and you can go home as soon as it's finished.  It might be a good idea to ask someone to bring you, particularly the fist time.  Unlike chemotherapy into the bladder, BCG treatment is never given immediately after surgery to remove bladder tumors.  There needs to be a delay usually of at least two weeks after surgery, before you can have it.  You won't be given treatment with BCG if you are unwell or have an infection in your urine.  You're usually asked to limit the amount you drink before your treatment.  This will help to increase the concentration of BCG in your bladder.  Drinking too much before your treatment may make your bladder feel uncomfortably full.  If you normally take water tablets (diuretics) take them later in the day after your treatment.  Your nurse or doctor will give you more advise about preparing for your treatment.  You will have a small tube (catheter) placed into your bladder.  Your doctor will then put the liquid vaccine directly into your bladder through the catheter and remove the catheter.  You will need to hold your urine for two hours afterwards.  Rotating every 15 minutes from side to  side. This can be difficult but it's to give the treatment time to work.  When the treatment is over you can go to the toilet.  After your treatment there are some precautions you'll need to take.  This is because BCG is a live vaccine and other people shouldn't be exposed to it.  For the next six hours, you'll need to avoid your urine splashing on the toilet seat and getting any urine on your hands.  It might be easer for men to sit down when they're using an ordinary toilet although using a stand up urinal should be alright.  The main this is to avoid splashing urine and spreading the vaccine.  You will also be asked to put 1/2 cup undiluted bleach into the toilet to destroy any live vaccine and leave it for 15 minutes until you flush for the next 6 voids.  Side Effects Because BCG goes directly into the bladder most of the side effects are linked with the bladder.  They usually go away within one to two days after your treatment.  The most common ones are: -needing to pass urine often -pain when you pass urine -blood in urine -flu-like symptoms (tiredness, general aching and raised temperature)  Theses side effects should settle down within a day or two.  If they don't get better contact your doctor.  Drinking lots of fluids can help flush the drug out of your bladder and reduce some of these effects.  Taking Ibuprofen or Aleve is encouraged unless you have a condition that would make these medications unsafe to take (renal failure, diabetes, gerd)  Rare side effects can include a continuing high temperature (fever), pain in your joints and a cough.  If you have any of these symptoms, or if you feel generally unwell, contact your doctor.  These symptoms could be a sign of a more serious infection (due to BCG) that needs to be treated immediately.  If this happens you'll be treated with the same drugs (antibiotics) that are used to treat TB.  Contraception Men should use a condom during sex for  the first 48 hours after their treatment.  If you are a women who has had BCG treatment then your partner should use a condom.  Using a condom will protect your partner from any vaccine present in your semen or vaginal fluid.  We don't know how BCG may affect a developing fetus so it's not advisable to become pregnant or father a child while having it.  It is important to use effective contraception during your treatment and for six weeks afterwards.  You can discuss this with your doctor or specialist nurse.

## 2022-09-06 LAB — MICROSCOPIC EXAMINATION

## 2022-09-06 LAB — URINALYSIS, COMPLETE
Bilirubin, UA: NEGATIVE
Glucose, UA: NEGATIVE
Ketones, UA: NEGATIVE
Leukocytes,UA: NEGATIVE
Nitrite, UA: NEGATIVE
Protein,UA: NEGATIVE
RBC, UA: NEGATIVE
Specific Gravity, UA: >1.03 — ABNORMAL HIGH (ref 1.005–1.030)
Urobilinogen, Ur: 0.2 mg/dL (ref 0.2–1.0)
pH, UA: 5.5 (ref 5.0–7.5)

## 2022-09-12 ENCOUNTER — Ambulatory Visit (INDEPENDENT_AMBULATORY_CARE_PROVIDER_SITE_OTHER): Payer: Medicare HMO | Admitting: Physician Assistant

## 2022-09-12 VITALS — BP 116/71 | HR 80

## 2022-09-12 DIAGNOSIS — D494 Neoplasm of unspecified behavior of bladder: Secondary | ICD-10-CM

## 2022-09-12 DIAGNOSIS — C679 Malignant neoplasm of bladder, unspecified: Secondary | ICD-10-CM

## 2022-09-12 LAB — MICROSCOPIC EXAMINATION: Epithelial Cells (non renal): >10 /hpf — AB (ref 0–10)

## 2022-09-12 LAB — URINALYSIS, COMPLETE
Bilirubin, UA: NEGATIVE
Glucose, UA: NEGATIVE
Ketones, UA: NEGATIVE
Leukocytes,UA: NEGATIVE
Nitrite, UA: NEGATIVE
RBC, UA: NEGATIVE
Specific Gravity, UA: >1.03 — ABNORMAL HIGH (ref 1.005–1.030)
Urobilinogen, Ur: 0.2 mg/dL (ref 0.2–1.0)
pH, UA: 5.5 (ref 5.0–7.5)

## 2022-09-12 MED ORDER — BCG LIVE 50 MG IS SUSR
3.2400 mL | Freq: Once | INTRAVESICAL | Status: AC
Start: 2022-09-12 — End: 2022-09-12
  Administered 2022-09-12: 81 mg via INTRAVESICAL

## 2022-09-12 NOTE — Progress Notes (Signed)
BCG Bladder Instillation  BCG # 2 of 6  Due to Bladder Cancer patient is present today for a BCG treatment. Patient was cleaned and prepped in a sterile fashion with betadine. A 14FR catheter was inserted, urine return was noted 1ml, urine was yellow in color.  50ml of reconstituted BCG was instilled into the bladder. The catheter was then removed. Patient tolerated well, no complications were noted  Performed by: Carman Ching, PA-C and Humberta Magallon-Mariche, CMA  Follow up/ Additional notes: 1 weeks for BCG #3 of 6

## 2022-09-13 ENCOUNTER — Ambulatory Visit: Payer: Medicare HMO | Admitting: Internal Medicine

## 2022-09-16 ENCOUNTER — Ambulatory Visit (INDEPENDENT_AMBULATORY_CARE_PROVIDER_SITE_OTHER): Payer: Medicare HMO | Admitting: Internal Medicine

## 2022-09-16 ENCOUNTER — Encounter: Payer: Self-pay | Admitting: Internal Medicine

## 2022-09-16 VITALS — BP 128/68 | HR 65 | Temp 97.3°F | Wt 136.0 lb

## 2022-09-16 DIAGNOSIS — E1169 Type 2 diabetes mellitus with other specified complication: Secondary | ICD-10-CM | POA: Diagnosis not present

## 2022-09-16 DIAGNOSIS — E663 Overweight: Secondary | ICD-10-CM

## 2022-09-16 DIAGNOSIS — J418 Mixed simple and mucopurulent chronic bronchitis: Secondary | ICD-10-CM

## 2022-09-16 DIAGNOSIS — D729 Disorder of white blood cells, unspecified: Secondary | ICD-10-CM | POA: Diagnosis not present

## 2022-09-16 DIAGNOSIS — I2510 Atherosclerotic heart disease of native coronary artery without angina pectoris: Secondary | ICD-10-CM | POA: Diagnosis not present

## 2022-09-16 DIAGNOSIS — F32A Depression, unspecified: Secondary | ICD-10-CM

## 2022-09-16 DIAGNOSIS — I2584 Coronary atherosclerosis due to calcified coronary lesion: Secondary | ICD-10-CM

## 2022-09-16 DIAGNOSIS — E1140 Type 2 diabetes mellitus with diabetic neuropathy, unspecified: Secondary | ICD-10-CM | POA: Diagnosis not present

## 2022-09-16 DIAGNOSIS — G894 Chronic pain syndrome: Secondary | ICD-10-CM

## 2022-09-16 DIAGNOSIS — Z6825 Body mass index (BMI) 25.0-25.9, adult: Secondary | ICD-10-CM

## 2022-09-16 DIAGNOSIS — F419 Anxiety disorder, unspecified: Secondary | ICD-10-CM | POA: Diagnosis not present

## 2022-09-16 DIAGNOSIS — E1142 Type 2 diabetes mellitus with diabetic polyneuropathy: Secondary | ICD-10-CM | POA: Diagnosis not present

## 2022-09-16 DIAGNOSIS — E785 Hyperlipidemia, unspecified: Secondary | ICD-10-CM

## 2022-09-16 DIAGNOSIS — K219 Gastro-esophageal reflux disease without esophagitis: Secondary | ICD-10-CM

## 2022-09-16 DIAGNOSIS — I700 Atherosclerosis of aorta: Secondary | ICD-10-CM | POA: Diagnosis not present

## 2022-09-16 DIAGNOSIS — C679 Malignant neoplasm of bladder, unspecified: Secondary | ICD-10-CM

## 2022-09-16 MED ORDER — ASPIRIN 81 MG PO TBEC: 81.0000 mg | DELAYED_RELEASE_TABLET | Freq: Every day | ORAL | 12 refills | Status: AC

## 2022-09-16 NOTE — Assessment & Plan Note (Signed)
She will continue treatment per urology

## 2022-09-16 NOTE — Assessment & Plan Note (Signed)
C-Met and lipid profile today Encouraged her to consume a low-fat diet Continue atorvastatin and Lovaza Will have her start taking baby aspirin daily 

## 2022-09-16 NOTE — Assessment & Plan Note (Signed)
Continue Trelegy and albuterol Encourage smoking cessation 

## 2022-09-16 NOTE — Assessment & Plan Note (Signed)
Stable on duloxetine, bupropion, clonazepam and hydroxyzine Support offered

## 2022-09-16 NOTE — Assessment & Plan Note (Signed)
A1c and urine micro albumin checked by endocrinology Encourage low-carb diet and exercise for weight loss Continue metformin, glipizide, Jardiance and Ozempic Advised her to schedule an appointment for an eye exam Encouraged routine foot exam

## 2022-09-16 NOTE — Assessment & Plan Note (Signed)
C-Met and lipid profile today Encouraged her to consume a low-fat diet Continue atorvastatin and Lovaza Will have her start taking baby aspirin daily

## 2022-09-16 NOTE — Assessment & Plan Note (Signed)
Encourage diet and exercise for weight loss 

## 2022-09-16 NOTE — Assessment & Plan Note (Signed)
Continue duloxetine and gabapentin  

## 2022-09-16 NOTE — Progress Notes (Signed)
Subjective:    Patient ID: Brandi Calderon, female    DOB: 02-Jun-1965, 57 y.o.   MRN: [id number]  HPI  Patient presents to clinic today for 33-month follow-up of chronic conditions.  DM2 with Peripheral Neuropathy: Her last A1c was 7.6%, 07/2022.  She is taking metformin, glipizide, jardiance and ozempic as prescribed.  She takes gabapentin as prescribed for neuropathic pain.  Her sugars range 119-140.  She checks her feet routinely.  Her last eye exam was 01/2021.  Flu 10/2021.  Pneumovax 03/2022.  COVID Pfizer x 1.  She follows with endocrinology.  HLD with CAD/Aortic Atherosclerosis: Her last LDL was 73, triglycerides 409, 07/2022.  She denies myalgias on atorvastatin and lovaza.  She is not taking aspirin daily.  She tries to consume low-fat diet.  Anxiety and Depression: Chronic, managed on duloxetine, bupropion, clonazepam and hydroxyzine.  She is not currently seeing a therapist or psychiatrist.  She denies SI/HI.  Chronic Pain: Mainly in her spine and bilateral SI joints.  She is taking duloxetine and gabapentin as prescribed.  She does not follow with pain management.  GERD: Triggered by everything she eats.  She denies breakthrough on pantoprazole.  There is no upper GI on file.  COPD: She denies chronic cough or shortness of breath.  She is taking trelegy and albuterol as prescribed.  There are no PFTs on file.  She does smoke.  She follows with pulmonology.  Bladder Cancer: She is currently undergoing BCG treatments.  She follows with urology.  Review of Systems  Past Medical History:  Diagnosis Date   Anxiety    Arthritis    Asthma    Back pain    COPD (chronic obstructive pulmonary disease) (HCC)    Depression    Diabetes mellitus    Headache    History of COVID-19    Hyperlipidemia    Hypertension    Neuromuscular disorder (HCC)    Sleep apnea    Tendonitis    Urinary tract infection due to extended-spectrum beta lactamase (ESBL) producing Escherichia coli  05/22/2022    Current Outpatient Medications  Medication Sig Dispense Refill   albuterol (VENTOLIN HFA) 108 (90 Base) MCG/ACT inhaler INHALE 1-2 PUFFS BY MOUTH EVERY 6 HOURS AS NEEDED FOR WHEEZE OR SHORTNESS OF BREATH (Patient taking differently: 1-2 puffs every 6 (six) hours as needed.) 8 each 2   Aspirin-Salicylamide-Caffeine (BC HEADACHE POWDER PO) Take 4 packets by mouth daily as needed (pain).     atorvastatin (LIPITOR) 80 MG tablet Take 80 mg by mouth at bedtime.     Blood Glucose Monitoring Suppl (ONETOUCH VERIO FLEX SYSTEM) w/Device KIT Use to check blood sugar daily.  DX E11.9 1 kit 1   clonazePAM (KLONOPIN) 0.5 MG tablet TAKE 1 TABLET BY MOUTH EVERY DAY AS NEEDED FOR ANXIETY 30 tablet 0   DULoxetine (CYMBALTA) 30 MG capsule Take 30 mg by mouth at bedtime.     fluticasone (FLONASE) 50 MCG/ACT nasal spray SPRAY 2 SPRAYS INTO EACH NOSTRIL EVERY DAY 48 mL 0   furosemide (LASIX) 40 MG tablet TAKE 1 TABLET BY MOUTH EVERY DAY 90 tablet 0   gabapentin (NEURONTIN) 300 MG capsule Take 300 mg by mouth at bedtime.     glipiZIDE (GLUCOTROL XL) 5 MG 24 hr tablet Take 5 mg by mouth daily with breakfast.     glucose blood test strip Use as instructed 100 each 0   JARDIANCE 25 MG TABS tablet Take 25 mg by mouth every morning.  Lancets (ONETOUCH ULTRASOFT) lancets Use as instructed 100 each 12   lisinopril (ZESTRIL) 5 MG tablet Take 5 mg by mouth every morning.     metFORMIN (GLUCOPHAGE) 1000 MG tablet TAKE 1 TABLET (1,000 MG TOTAL) BY MOUTH 2 (TWO) TIMES DAILY WITH A MEAL. 60 tablet 0   oxybutynin (DITROPAN XL) 15 MG 24 hr tablet Take 1 tablet (15 mg total) by mouth daily. 90 tablet 3   OXYGEN Inhale 1 L into the lungs at bedtime.     pantoprazole (PROTONIX) 40 MG tablet TAKE 1 TABLET (40 MG TOTAL) BY MOUTH DAILY AS NEEDED (HEARTBURN). 90 tablet 0   polyethylene glycol (MIRALAX) 17 g packet Take 17 g by mouth daily. (Patient taking differently: Take 17 g by mouth daily as needed.) 14 each 0    Semaglutide, 2 MG/DOSE, 8 MG/3ML SOPN Inject 0.75 mLs (2 mg total) subcutaneously once a week     TRELEGY ELLIPTA 100-62.5-25 MCG/ACT AEPB TAKE 1 PUFF BY MOUTH EVERY DAY (Patient taking differently: Inhale 1 puff into the lungs every morning.) 60 each 2   No current facility-administered medications for this visit.    Allergies  Allergen Reactions   Penicillins Anaphylaxis    Has patient had a PCN reaction causing immediate rash, facial/tongue/throat swelling, SOB or lightheadedness with hypotension: Yes Has patient had a PCN reaction causing severe rash involving mucus membranes or skin necrosis: No Has patient had a PCN reaction that required hospitalization: No Has patient had a PCN reaction occurring within the last 10 years: No If all of the above answers are "NO", then may proceed with Cephalosporin use.   Throat swells   Victoza [Liraglutide]     Severe nausea   Vicodin [Hydrocodone-Acetaminophen] Rash    And hives    Family History  Problem Relation Age of Onset   Diabetes Mother    Kidney disease Father    Diabetes Father    Dementia Father    Diabetes Sister    Diabetes Brother    Diabetes Brother    Colon cancer Neg Hx    Esophageal cancer Neg Hx    Stomach cancer Neg Hx    Rectal cancer Neg Hx     Social History   Socioeconomic History   Marital status: Divorced    Spouse name: Not on file   Number of children: Not on file   Years of education: Not on file   Highest education level: Not on file  Occupational History   Not on file  Tobacco Use   Smoking status: Every Day    Packs/day: 1.00    Years: 39.00    Additional pack years: 0.00    Total pack years: 39.00    Types: Cigarettes    Passive exposure: Current   Smokeless tobacco: Never   Tobacco comments:    1PPD 06/10/2022  Vaping Use   Vaping Use: Never used  Substance and Sexual Activity   Alcohol use: No   Drug use: Yes    Types: Marijuana    Comment: States she smokes MJ when she does  not have any Tramadol.   Sexual activity: Not on file  Other Topics Concern   Not on file  Social History Narrative   ** Merged History Encounter ** In the process of getting SSD for depression, DM and neuropathy      Active smoker [~ 2p/day; cutting down]; no alcohol; lives in [place]; self. Used to work in [employer].    Social Determinants of  Health   Financial Resource Strain: Low Risk  (02/01/2022)   Overall Financial Resource Strain (CARDIA)    Difficulty of Paying Living Expenses: Not very hard  Food Insecurity: No Food Insecurity (06/14/2022)   Hunger Vital Sign    Worried About Running Out of Food in the Last Year: Never true    Ran Out of Food in the Last Year: Never true  Transportation Needs: No Transportation Needs (06/14/2022)   PRAPARE - Administrator, Civil Service (Medical): No    Lack of Transportation (Non-Medical): No  Physical Activity: Insufficiently Active (02/01/2022)   Exercise Vital Sign    Days of Exercise per Week: 2 days    Minutes of Exercise per Session: 60 min  Stress: No Stress Concern Present (02/01/2022)   [employer] of Occupational Health - Occupational Stress Questionnaire    Feeling of Stress : Only a little  Social Connections: Socially Isolated (02/01/2022)   Social Connection and Isolation Panel [NHANES]    Frequency of Communication with Friends and Family: More than three times a week    Frequency of Social Gatherings with Friends and Family: More than three times a week    Attends Religious Services: Never    Database administrator or Organizations: No    Attends Banker Meetings: Never    Marital Status: Divorced  Catering manager Violence: Not At Risk (02/01/2022)   Humiliation, Afraid, Rape, and Kick questionnaire    Fear of Current or Ex-Partner: No    Emotionally Abused: No    Physically Abused: No    Sexually Abused: No     Constitutional: Patient reports fatigue.  Denies fever, malaise, headache or  abrupt weight changes.  HEENT: Denies eye pain, eye redness, ear pain, ringing in the ears, wax buildup, runny nose, nasal congestion, bloody nose, or sore throat. Respiratory: Denies difficulty breathing, shortness of breath, cough or sputum production.   Cardiovascular: Denies chest pain, chest tightness, palpitations or swelling in the hands or feet.  Gastrointestinal: Denies abdominal pain, bloating, constipation, diarrhea or blood in the stool.  GU: Denies urgency, frequency, pain with urination, burning sensation, blood in urine, odor or discharge. Musculoskeletal: Patient reports chronic joint pain.  Denies decrease in range of motion, difficulty with gait, muscle pain or joint swelling.  Skin: Denies redness, rashes, lesions or ulcercations.  Neurological: Patient reports neuropathic pain.  Denies dizziness, difficulty with memory, difficulty with speech or problems with balance and coordination.  Psych: Patient has a history of anxiety and depression.  Denies SI/HI.  No other specific complaints in a complete review of systems (except as listed in HPI above).     Objective:   Physical Exam  BP 128/68 (BP Location: Right Arm, Patient Position: Sitting, Cuff Size: Normal)   Pulse 65   Temp (!) 97.3 F (36.3 C) (Temporal)   Wt 136 lb (61.7 kg)   LMP 12/01/2017   SpO2 95%   BMI 25.70 kg/m   Wt Readings from Last 3 Encounters:  09/05/22 136 lb (61.7 kg)  08/07/22 130 lb (59 kg)  07/17/22 130 lb 1.1 oz (59 kg)    General: Appears her stated age, overweight in NAD. Skin: Warm, dry and intact. No rashes, lesions or ulcerations noted. HEENT: Head: normal shape and size; Eyes: sclera white, no icterus, conjunctiva pink, PERRLA and EOMs intact;  Neck:  Neck supple, trachea midline. No masses, lumps or thyromegaly present.  Cardiovascular: Normal rate and rhythm. S1,S2 noted.  No  murmur, rubs or gallops noted. No JVD or BLE edema. No carotid bruits noted. Pulmonary/Chest: Normal  effort and diminished breath sounds. No respiratory distress. No wheezes, rales or ronchi noted.  Abdomen: Soft and nontender. Normal bowel sounds. No distention or masses noted. Liver, spleen and kidneys non palpable. Musculoskeletal: Strength 5/5 BUE/BLE.  No difficulty with gait.  Neurological: Alert and oriented. Cranial nerves II-XII grossly intact. Coordination normal.  Psychiatric: Mood and affect normal. Behavior is normal. Judgment and thought content normal.     BMET    Component Value Date/Time   NA 142 07/18/2022 1427   NA 139 06/25/2017 1027   NA 134 (L) 12/07/2012 1809   K 4.0 07/18/2022 1427   K 3.4 (L) 12/07/2012 1809   CL 106 07/18/2022 1427   CL 103 12/07/2012 1809   CO2 29 07/18/2022 1427   CO2 25 12/07/2012 1809   GLUCOSE 104 (H) 07/18/2022 1427   GLUCOSE 274 (H) 12/07/2012 1809   BUN 12 07/18/2022 1427   BUN 10 06/25/2017 1027   BUN 10 12/07/2012 1809   CREATININE 0.61 07/18/2022 1427   CREATININE 0.71 03/14/2022 1336   CALCIUM 9.3 07/18/2022 1427   CALCIUM 8.6 12/07/2012 1809   GFRNONAA >60 07/18/2022 1427   GFRNONAA 84 09/07/2019 1019   GFRAA 97 09/07/2019 1019    Lipid Panel     Component Value Date/Time   CHOL 170 03/14/2022 1336   CHOL 120 12/24/2016 1827   TRIG 132 03/14/2022 1336   HDL 44 (L) 03/14/2022 1336   HDL 29 (L) 12/24/2016 1827   CHOLHDL 3.9 03/14/2022 1336   LDLCALC 103 (H) 03/14/2022 1336    CBC    Component Value Date/Time   WBC 12.4 (H) 06/12/2022 0516   RBC 3.54 (L) 06/12/2022 0516   HGB 10.3 (L) 06/12/2022 0516   HGB 12.8 06/25/2017 1027   HCT 31.9 (L) 06/12/2022 0516   HCT 38.3 06/25/2017 1027   PLT 277 06/12/2022 0516   PLT 348 06/25/2017 1027   MCV 90.1 06/12/2022 0516   MCV 90 06/25/2017 1027   MCV 80 12/07/2012 1809   MCH 29.1 06/12/2022 0516   MCHC 32.3 06/12/2022 0516   RDW 15.3 06/12/2022 0516   RDW 15.0 06/25/2017 1027   RDW 17.8 (H) 12/07/2012 1809   LYMPHSABS 1.3 06/12/2022 0516   LYMPHSABS 3.3 (H)  09/11/2016 1131   MONOABS 0.8 06/12/2022 0516   EOSABS 0.2 06/12/2022 0516   EOSABS 0.2 09/11/2016 1131   BASOSABS 0.0 06/12/2022 0516   BASOSABS 0.0 09/11/2016 1131    Hgb A1C Lab Results  Component Value Date   HGBA1C 7.1 (H) 03/14/2022           Assessment & Plan:     RTC in 6 months for annual exam Nicki Reaper, NP

## 2022-09-16 NOTE — Patient Instructions (Signed)

## 2022-09-16 NOTE — Assessment & Plan Note (Signed)
CBC reviewed Encouraged smoking cessation

## 2022-09-16 NOTE — Assessment & Plan Note (Signed)
C-Met and lipid profile today Encouraged her to consume a low-fat diet Continue atorvastatin 

## 2022-09-16 NOTE — Assessment & Plan Note (Signed)
Continue gabapentin Discussed the importance of good sugar control

## 2022-09-16 NOTE — Assessment & Plan Note (Signed)
Avoid foods that trigger reflux Continue pantoprazole 

## 2022-09-19 ENCOUNTER — Ambulatory Visit (INDEPENDENT_AMBULATORY_CARE_PROVIDER_SITE_OTHER): Payer: Medicare HMO | Admitting: Physician Assistant

## 2022-09-19 VITALS — BP 120/69 | HR 66

## 2022-09-19 DIAGNOSIS — D494 Neoplasm of unspecified behavior of bladder: Secondary | ICD-10-CM | POA: Diagnosis not present

## 2022-09-19 DIAGNOSIS — C679 Malignant neoplasm of bladder, unspecified: Secondary | ICD-10-CM | POA: Diagnosis not present

## 2022-09-19 LAB — MICROSCOPIC EXAMINATION: Epithelial Cells (non renal): >10 /hpf — AB (ref 0–10)

## 2022-09-19 LAB — URINALYSIS, COMPLETE
Bilirubin, UA: NEGATIVE
Glucose, UA: NEGATIVE
Nitrite, UA: NEGATIVE
Protein,UA: NEGATIVE
RBC, UA: NEGATIVE
Specific Gravity, UA: >1.03 — ABNORMAL HIGH (ref 1.005–1.030)
Urobilinogen, Ur: 0.2 mg/dL (ref 0.2–1.0)
pH, UA: 5 (ref 5.0–7.5)

## 2022-09-19 MED ORDER — BCG LIVE 50 MG IS SUSR
3.2400 mL | Freq: Once | INTRAVESICAL | Status: AC
Start: 2022-09-19 — End: 2022-09-19
  Administered 2022-09-19: 81 mg via INTRAVESICAL

## 2022-09-19 NOTE — Progress Notes (Signed)
BCG Bladder Instillation  BCG # 3 of 6  Due to Bladder Cancer patient is present today for a BCG treatment. Patient was cleaned and prepped in a sterile fashion with betadine. A 14FR catheter was inserted, urine return was noted 10ml, urine was yellow in color.  50ml of reconstituted BCG was instilled into the bladder. The catheter was then removed. Patient tolerated well, no complications were noted  Performed by: Carman Ching, PA-C and Humberta Magallon-Mariche, CMA  Follow up/ Additional notes: 1 week for BCG #4

## 2022-09-23 DIAGNOSIS — J449 Chronic obstructive pulmonary disease, unspecified: Secondary | ICD-10-CM | POA: Diagnosis not present

## 2022-09-24 ENCOUNTER — Other Ambulatory Visit: Payer: Self-pay | Admitting: Internal Medicine

## 2022-09-25 NOTE — Telephone Encounter (Signed)
Requested medication (s) are due for refill today: yes  Requested medication (s) are on the active medication list: yes  Last refill:  08/23/22  Future visit scheduled: yes  Notes to clinic:  Unable to refill per protocol, cannot delegate.      Requested Prescriptions  Pending Prescriptions Disp Refills   clonazePAM (KLONOPIN) 0.5 MG tablet [Pharmacy Med Name: CLONAZEPAM 0.5 MG TABLET] 30 tablet 0    Sig: TAKE 1 TABLET BY MOUTH EVERY DAY AS NEEDED FOR ANXIETY     Not Delegated - Psychiatry: Anxiolytics/Hypnotics 2 Failed - 09/24/2022  5:53 PM      Failed - This refill cannot be delegated      Failed - Urine Drug Screen completed in last 360 days      Passed - Patient is not pregnant      Passed - Valid encounter within last 6 months    Recent Outpatient Visits           1 week ago Aortic atherosclerosis [facility])   [place] [facility] [place], [place] W, NP   1 month ago Type 2 diabetes mellitus with hyperglycemia, without long-term current use of insulin [facility])   [place] [facility] Delles, Gentry Fitz A, RPH-CPP   3 months ago Type 2 diabetes mellitus with hyperglycemia, without long-term current use of insulin [facility])   [place] [facility] Delles, Gentry Fitz A, RPH-CPP   4 months ago Abnormal uterine bleeding   [place] [facility] [place], [place] W, NP   4 months ago Type 2 diabetes mellitus with hyperglycemia, without long-term current use of insulin [facility])   [place] [facility] Delles, Jackelyn Poling, RPH-CPP       Future Appointments             Tomorrow Carman Ching, PA-C [facility] [place]   In 1 week Carman Ching, PA-C [facility] [place]   In 2 weeks Carman Ching, PA-C [facility] [place]   In 5 months [place], Salvadore Oxford, NP [place] [facility], [facility]

## 2022-09-26 ENCOUNTER — Ambulatory Visit (INDEPENDENT_AMBULATORY_CARE_PROVIDER_SITE_OTHER): Payer: Medicare HMO | Admitting: Physician Assistant

## 2022-09-26 VITALS — BP 110/66 | HR 82

## 2022-09-26 DIAGNOSIS — C679 Malignant neoplasm of bladder, unspecified: Secondary | ICD-10-CM | POA: Diagnosis not present

## 2022-09-26 DIAGNOSIS — D494 Neoplasm of unspecified behavior of bladder: Secondary | ICD-10-CM | POA: Diagnosis not present

## 2022-09-26 LAB — URINALYSIS, COMPLETE
Bilirubin, UA: NEGATIVE
Glucose, UA: NEGATIVE
Ketones, UA: NEGATIVE
Leukocytes,UA: NEGATIVE
Nitrite, UA: NEGATIVE
Protein,UA: NEGATIVE
RBC, UA: NEGATIVE
Specific Gravity, UA: >1.03 — ABNORMAL HIGH (ref 1.005–1.030)
Urobilinogen, Ur: 0.2 mg/dL (ref 0.2–1.0)
pH, UA: 5 (ref 5.0–7.5)

## 2022-09-26 LAB — MICROSCOPIC EXAMINATION

## 2022-09-26 MED ORDER — BCG LIVE 50 MG IS SUSR
3.2400 mL | Freq: Once | INTRAVESICAL | Status: AC
Start: 2022-09-26 — End: 2022-09-26
  Administered 2022-09-26: 81 mg via INTRAVESICAL

## 2022-09-26 NOTE — Progress Notes (Signed)
BCG Bladder Instillation  BCG # 4 of 6  Due to Bladder Cancer patient is present today for a BCG treatment. Patient was cleaned and prepped in a sterile fashion with betadine. A 14FR catheter was inserted, urine return was noted 10ml, urine was yellow in color.  50ml of reconstituted BCG was instilled into the bladder. The catheter was then removed. Patient tolerated well, no complications were noted  Performed by: Carman Ching, PA-C and Debbe Bales, CMA  Follow up/ Additional notes: 1 week for BCG #5

## 2022-09-27 ENCOUNTER — Ambulatory Visit: Payer: Self-pay | Admitting: *Deleted

## 2022-09-27 NOTE — Patient Outreach (Signed)
  Care Coordination   09/27/2022 Name: Brandi Calderon MRN: [id number] DOB: 08/18/1965   Care Coordination Outreach Attempts:  An unsuccessful telephone outreach was attempted for a scheduled appointment today.  Follow Up Plan:  Additional outreach attempts will be made to offer the patient care coordination information and services.   Encounter Outcome:  No Answer   Care Coordination Interventions:  No, not indicated    Kemper Durie, RN, MSN, [facility] Care Management Care Management Coordinator [phone]

## 2022-10-04 ENCOUNTER — Ambulatory Visit: Payer: Medicare HMO | Admitting: Physician Assistant

## 2022-10-05 ENCOUNTER — Other Ambulatory Visit: Payer: Self-pay | Admitting: Internal Medicine

## 2022-10-07 NOTE — Telephone Encounter (Signed)
Requested Prescriptions  Refused Prescriptions Disp Refills   furosemide (LASIX) 40 MG tablet [Pharmacy Med Name: FUROSEMIDE 40 MG TABLET] 90 tablet 0    Sig: TAKE 1 TABLET BY MOUTH EVERY DAY     Cardiovascular:  Diuretics - Loop Failed - 10/05/2022 12:09 PM      Failed - Mg Level in normal range and within 180 days    Magnesium  Date Value Ref Range Status  09/05/2016 1.4 (L) 1.7 - 2.4 mg/dL Final         Passed - K in normal range and within 180 days    Potassium  Date Value Ref Range Status  07/18/2022 4.0 3.5 - 5.1 mmol/L Final  12/07/2012 3.4 (L) 3.5 - 5.1 mmol/L Final         Passed - Ca in normal range and within 180 days    Calcium  Date Value Ref Range Status  07/18/2022 9.3 8.9 - 10.3 mg/dL Final   Calcium, Total  Date Value Ref Range Status  12/07/2012 8.6 8.5 - 10.1 mg/dL Final   Calcium, Ion  Date Value Ref Range Status  05/09/2010 1.11 (L) 1.12 - 1.32 mmol/L Final         Passed - Na in normal range and within 180 days    Sodium  Date Value Ref Range Status  07/18/2022 142 135 - 145 mmol/L Final  06/25/2017 139 134 - 144 mmol/L Final  12/07/2012 134 (L) 136 - 145 mmol/L Final         Passed - Cr in normal range and within 180 days    Creat  Date Value Ref Range Status  03/14/2022 0.71 0.50 - 1.03 mg/dL Final   Creatinine, Ser  Date Value Ref Range Status  07/18/2022 0.61 0.44 - 1.00 mg/dL Final         Passed - Cl in normal range and within 180 days    Chloride  Date Value Ref Range Status  07/18/2022 106 98 - 111 mmol/L Final  12/07/2012 103 98 - 107 mmol/L Final         Passed - Last BP in normal range    BP Readings from Last 1 Encounters:  09/26/22 110/66         Passed - Valid encounter within last 6 months    Recent Outpatient Visits           3 weeks ago Aortic atherosclerosis [facility])   [place] [facility] [place], [place], NP   1 month ago Type 2 diabetes mellitus with hyperglycemia, without long-term current  use of insulin [facility])   [place] [facility] Delles, Gentry Fitz A, RPH-CPP   3 months ago Type 2 diabetes mellitus with hyperglycemia, without long-term current use of insulin [facility])   [place] [facility] Delles, Gentry Fitz A, RPH-CPP   4 months ago Abnormal uterine bleeding   [place] [facility] [place], [place] W, NP   5 months ago Type 2 diabetes mellitus with hyperglycemia, without long-term current use of insulin (HCC)   [place] [facility] Delles, Jackelyn Poling, RPH-CPP       Future Appointments             Tomorrow Carman Ching, PA-C [facility] [place]   In 3 days Carman Ching, PA-C [facility] [place]   In 5 months [place], Salvadore Oxford, NP [facility] [place], [facility]

## 2022-10-08 ENCOUNTER — Ambulatory Visit: Payer: Medicare HMO | Admitting: Physician Assistant

## 2022-10-09 DIAGNOSIS — K219 Gastro-esophageal reflux disease without esophagitis: Secondary | ICD-10-CM | POA: Diagnosis not present

## 2022-10-09 DIAGNOSIS — E785 Hyperlipidemia, unspecified: Secondary | ICD-10-CM | POA: Diagnosis not present

## 2022-10-09 DIAGNOSIS — G4733 Obstructive sleep apnea (adult) (pediatric): Secondary | ICD-10-CM | POA: Diagnosis not present

## 2022-10-09 DIAGNOSIS — M4800 Spinal stenosis, site unspecified: Secondary | ICD-10-CM | POA: Diagnosis not present

## 2022-10-09 DIAGNOSIS — M1990 Unspecified osteoarthritis, unspecified site: Secondary | ICD-10-CM | POA: Diagnosis not present

## 2022-10-09 DIAGNOSIS — K5900 Constipation, unspecified: Secondary | ICD-10-CM | POA: Diagnosis not present

## 2022-10-09 DIAGNOSIS — F411 Generalized anxiety disorder: Secondary | ICD-10-CM | POA: Diagnosis not present

## 2022-10-09 DIAGNOSIS — I129 Hypertensive chronic kidney disease with stage 1 through stage 4 chronic kidney disease, or unspecified chronic kidney disease: Secondary | ICD-10-CM | POA: Diagnosis not present

## 2022-10-09 DIAGNOSIS — I2510 Atherosclerotic heart disease of native coronary artery without angina pectoris: Secondary | ICD-10-CM | POA: Diagnosis not present

## 2022-10-09 DIAGNOSIS — N3941 Urge incontinence: Secondary | ICD-10-CM | POA: Diagnosis not present

## 2022-10-10 ENCOUNTER — Ambulatory Visit (INDEPENDENT_AMBULATORY_CARE_PROVIDER_SITE_OTHER): Payer: Medicare HMO | Admitting: Physician Assistant

## 2022-10-10 ENCOUNTER — Other Ambulatory Visit: Payer: Self-pay | Admitting: Internal Medicine

## 2022-10-10 DIAGNOSIS — C679 Malignant neoplasm of bladder, unspecified: Secondary | ICD-10-CM

## 2022-10-10 DIAGNOSIS — R310 Gross hematuria: Secondary | ICD-10-CM | POA: Diagnosis not present

## 2022-10-10 DIAGNOSIS — J449 Chronic obstructive pulmonary disease, unspecified: Secondary | ICD-10-CM

## 2022-10-10 DIAGNOSIS — D494 Neoplasm of unspecified behavior of bladder: Secondary | ICD-10-CM

## 2022-10-10 LAB — MICROSCOPIC EXAMINATION: Epithelial Cells (non renal): >10 /hpf — AB (ref 0–10)

## 2022-10-10 LAB — URINALYSIS, COMPLETE
Bilirubin, UA: NEGATIVE
Glucose, UA: NEGATIVE
Ketones, UA: NEGATIVE
Leukocytes,UA: NEGATIVE
Nitrite, UA: NEGATIVE
RBC, UA: NEGATIVE
Specific Gravity, UA: >1.03 — ABNORMAL HIGH (ref 1.005–1.030)
Urobilinogen, Ur: 0.2 mg/dL (ref 0.2–1.0)
pH, UA: 5 (ref 5.0–7.5)

## 2022-10-10 MED ORDER — BCG LIVE 50 MG IS SUSR
3.2400 mL | Freq: Once | INTRAVESICAL | Status: AC
Start: 2022-10-10 — End: 2022-10-10
  Administered 2022-10-10: 81 mg via INTRAVESICAL

## 2022-10-10 NOTE — Progress Notes (Signed)
BCG Bladder Instillation  BCG # 5 of 6  Due to Bladder Cancer patient is present today for a BCG treatment. Patient was cleaned and prepped in a sterile fashion with betadine. A 14FR catheter was inserted, urine return was noted 0ml.  50ml of reconstituted BCG was instilled into the bladder. The catheter was then removed. Patient tolerated well, no complications were noted  Performed by: Carman Ching, PA-C and [place] Pink, CMA  Follow up/ Additional notes: 1 week for BCG #6

## 2022-10-11 NOTE — Telephone Encounter (Signed)
Requested medication (s) are due for refill today: yes Requested medication (s) are on the active medication list: yes  Last refill:  07/02/22 #60 2 RF  Future visit scheduled: yes  Notes to clinic:  med not assigned to a protocol   Requested Prescriptions  Pending Prescriptions Disp Refills   TRELEGY ELLIPTA 100-62.5-25 MCG/ACT AEPB [Pharmacy Med Name: TRELEGY ELLIPTA 100-62.5-25] 60 each 2    Sig: INHALE 1 PUFF BY MOUTH EVERY DAY     Off-Protocol Failed - 10/10/2022  1:29 PM      Failed - Medication not assigned to a protocol, review manually.      Passed - Valid encounter within last 12 months    Recent Outpatient Visits           3 weeks ago Aortic atherosclerosis [facility])   [place] [facility] [place], [place] W, NP   1 month ago Type 2 diabetes mellitus with hyperglycemia, without long-term current use of insulin [facility])   [place] Delles, Gentry Fitz A, RPH-CPP   3 months ago Type 2 diabetes mellitus with hyperglycemia, without long-term current use of insulin [facility])   [place] [facility] Delles, Gentry Fitz A, RPH-CPP   5 months ago Abnormal uterine bleeding   [place] [facility] [place], [place] W, NP   5 months ago Type 2 diabetes mellitus with hyperglycemia, without long-term current use of insulin [facility])   [place] [facility] Delles, Jackelyn Poling, RPH-CPP       Future Appointments             In 2 weeks Carman Ching, PA-C [facility] [place]   In 5 months [place], Salvadore Oxford, NP [facility] [place]

## 2022-10-15 ENCOUNTER — Other Ambulatory Visit: Payer: Self-pay | Admitting: Internal Medicine

## 2022-10-16 NOTE — Telephone Encounter (Signed)
Requested Prescriptions  Pending Prescriptions Disp Refills   fluticasone (FLONASE) 50 MCG/ACT nasal spray [Pharmacy Med Name: FLUTICASONE PROP 50 MCG SPRAY] 48 mL 0    Sig: SPRAY 2 SPRAYS INTO EACH NOSTRIL EVERY DAY     Ear, Nose, and Throat: Nasal Preparations - Corticosteroids Passed - 10/15/2022  2:03 PM      Passed - Valid encounter within last 12 months    Recent Outpatient Visits           1 month ago Aortic atherosclerosis [facility])   [place] [facility] [place], [place], NP   1 month ago Type 2 diabetes mellitus with hyperglycemia, without long-term current use of insulin [facility])   [place] [facility] Delles, Gentry Fitz A, RPH-CPP   3 months ago Type 2 diabetes mellitus with hyperglycemia, without long-term current use of insulin [facility])   [place] [facility] Delles, Gentry Fitz A, RPH-CPP   5 months ago Abnormal uterine bleeding   [place] [facility] [place], [place] W, NP   5 months ago Type 2 diabetes mellitus with hyperglycemia, without long-term current use of insulin [facility])   [place] [facility] Delles, Jackelyn Poling, RPH-CPP       Future Appointments             In 1 week Carman Ching, PA-C [facility] [place]   In 5 months [place], Salvadore Oxford, NP [place] [facility], [facility]

## 2022-10-17 ENCOUNTER — Ambulatory Visit (INDEPENDENT_AMBULATORY_CARE_PROVIDER_SITE_OTHER): Payer: Medicare HMO | Admitting: Physician Assistant

## 2022-10-17 DIAGNOSIS — D494 Neoplasm of unspecified behavior of bladder: Secondary | ICD-10-CM

## 2022-10-17 DIAGNOSIS — C679 Malignant neoplasm of bladder, unspecified: Secondary | ICD-10-CM

## 2022-10-17 LAB — URINALYSIS, COMPLETE
Bilirubin, UA: NEGATIVE
Glucose, UA: NEGATIVE
Leukocytes,UA: NEGATIVE
Nitrite, UA: NEGATIVE
Protein,UA: NEGATIVE
RBC, UA: NEGATIVE
Specific Gravity, UA: >1.03 — ABNORMAL HIGH (ref 1.005–1.030)
Urobilinogen, Ur: 0.2 mg/dL (ref 0.2–1.0)
pH, UA: 5.5 (ref 5.0–7.5)

## 2022-10-17 LAB — MICROSCOPIC EXAMINATION: RBC, Urine: NONE SEEN /hpf (ref 0–2)

## 2022-10-17 MED ORDER — BCG LIVE 50 MG IS SUSR
3.2400 mL | Freq: Once | INTRAVESICAL | Status: AC
Start: 2022-10-17 — End: 2022-10-17
  Administered 2022-10-17: 81 mg via INTRAVESICAL

## 2022-10-17 NOTE — Progress Notes (Signed)
BCG Bladder Instillation  BCG # 6 of 6  Due to Bladder Cancer patient is present today for a BCG treatment. Patient was cleaned and prepped in a sterile fashion with betadine. A 14FR catheter was inserted, urine return was noted 2ml, urine was yekkiw in color.  50ml of reconstituted BCG was instilled into the bladder. The catheter was then removed. Patient tolerated well, no complications were noted  Performed by: Carman Ching, PA-C and Domingo Cocking, CMA  Follow up/ Additional notes: Return in about 4 weeks (around 11/14/2022) for Cysto with Dr. Richardo Hanks.

## 2022-10-23 DIAGNOSIS — J449 Chronic obstructive pulmonary disease, unspecified: Secondary | ICD-10-CM | POA: Diagnosis not present

## 2022-10-24 ENCOUNTER — Ambulatory Visit: Payer: Self-pay | Admitting: *Deleted

## 2022-10-24 NOTE — Patient Outreach (Signed)
  Care Coordination   Follow Up Visit Note   10/24/2022 Name: Brandi Calderon MRN: [id number] DOB: 11-Apr-1965  Brandi Calderon is a 57 y.o. year old female who sees [place], Salvadore Oxford, NP for primary care. I spoke with  Brandi Calderon by phone today.  What matters to the patients health and wellness today?  Decrease Ozempic to alleviate GI symptoms    Goals Addressed             This Visit's Progress    COMPLETED: Effective surgery without complications       Care Coordination Interventions: Evaluation of current treatment plan related to transurethral resection of bladder tumor and patient's adherence to plan as established by provider Advised patient to discuss follow up with urology and/or go to urgent care if having trouble with urinating or bleeding Reviewed scheduled/upcoming provider appointments including 6/6 Discussed plans with patient for ongoing care management follow up and provided patient with direct contact information for care management team  7/25 - Recovered from surgery well     Effective treatment for bladder cancer       Interventions Today    Flowsheet Row Most Recent Value  Chronic Disease   Chronic disease during today's visit Other  [bladder cancer]  General Interventions   General Interventions Discussed/Reviewed General Interventions Reviewed, Doctor Visits  Doctor Visits Discussed/Reviewed Doctor Visits Reviewed, PCP, Specialist  [Pharmacist follow up 8/12, endocrine 8/19, urology 9/5]  PCP/Specialist Visits Compliance with follow-up visit  Education Interventions   Education Provided Provided Education  Provided Verbal Education On Other, When to see the doctor, Medication, Blood Sugar Monitoring  [Cancer treatment done last week. Ozempic 2mg , state this causes GI issues, looking to decrease back to 1mg .  She will call MD. blood glucose range 120-140]           COMPLETED: Management of dizziness and fatigue (possibly related to COPD)   On  track    Care Coordination Interventions: Provided patient with basic written and verbal COPD education on self care/management/and exacerbation prevention Advised patient to track and manage COPD triggers Provided instruction about proper use of medications used for management of COPD including inhalers Advised patient to self assesses COPD action plan zone and make appointment with provider if in the yellow zone for 48 hours without improvement   7/25 - no current issues with COPD        SDOH assessments and interventions completed:  No     Care Coordination Interventions:  Yes, provided   Follow up plan: Follow up call scheduled for 9/9    Encounter Outcome:  Pt. Visit Completed   Kemper Durie, RN, MSN, [facility] Care Management Care Management Coordinator [phone]

## 2022-10-25 ENCOUNTER — Other Ambulatory Visit: Payer: Self-pay | Admitting: Internal Medicine

## 2022-10-25 ENCOUNTER — Ambulatory Visit: Payer: Medicare HMO | Admitting: Physician Assistant

## 2022-10-25 NOTE — Telephone Encounter (Signed)
Requested medication (s) are due for refill today - yes  Requested medication (s) are on the active medication list -yes  Future visit scheduled -yes  Last refill: 09/26/22 #30  Notes to clinic: non delegated Rx  Requested Prescriptions  Pending Prescriptions Disp Refills   clonazePAM (KLONOPIN) 0.5 MG tablet [Pharmacy Med Name: CLONAZEPAM 0.5 MG TABLET] 30 tablet 0    Sig: TAKE 1 TABLET BY MOUTH EVERY DAY AS NEEDED FOR ANXIETY     Not Delegated - Psychiatry: Anxiolytics/Hypnotics 2 Failed - 10/25/2022 11:30 AM      Failed - This refill cannot be delegated      Failed - Urine Drug Screen completed in last 360 days      Passed - Patient is not pregnant      Passed - Valid encounter within last 6 months    Recent Outpatient Visits           1 month ago Aortic atherosclerosis (HCC)   [place] [facility] [place], [place] W, NP   2 months ago Type 2 diabetes mellitus with hyperglycemia, without long-term current use of insulin [facility])   [place] [facility] Delles, Gentry Fitz A, RPH-CPP   4 months ago Type 2 diabetes mellitus with hyperglycemia, without long-term current use of insulin [facility])   [place] [facility] Delles, Gentry Fitz A, RPH-CPP   5 months ago Abnormal uterine bleeding   [place] [facility] [place], [place] W, NP   5 months ago Type 2 diabetes mellitus with hyperglycemia, without long-term current use of insulin (HCC)   [place] [facility] Delles, Jackelyn Poling, RPH-CPP       Future Appointments             In 4 months [name], Salvadore Oxford, NP [facility], [facility]               Requested Prescriptions  Pending Prescriptions Disp Refills   clonazePAM (KLONOPIN) 0.5 MG tablet [Pharmacy Med Name: CLONAZEPAM 0.5 MG TABLET] 30 tablet 0    Sig: TAKE 1 TABLET BY MOUTH EVERY DAY AS NEEDED FOR ANXIETY     Not Delegated - Psychiatry: Anxiolytics/Hypnotics 2  Failed - 10/25/2022 11:30 AM      Failed - This refill cannot be delegated      Failed - Urine Drug Screen completed in last 360 days      Passed - Patient is not pregnant      Passed - Valid encounter within last 6 months    Recent Outpatient Visits           1 month ago Aortic atherosclerosis [facility])   [place] [facility] [place], [place] W, NP   2 months ago Type 2 diabetes mellitus with hyperglycemia, without long-term current use of insulin [facility])   [facility] Delles, Gentry Fitz A, RPH-CPP   4 months ago Type 2 diabetes mellitus with hyperglycemia, without long-term current use of insulin [facility])   [place] [facility] Delles, Gentry Fitz A, RPH-CPP   5 months ago Abnormal uterine bleeding   [place] [facility] [place], [place] W, NP   5 months ago Type 2 diabetes mellitus with hyperglycemia, without long-term current use of insulin [facility])    [facility] Delles, Jackelyn Poling, RPH-CPP       Future Appointments  In 4 months [name], Salvadore Oxford, NP [place] [facility], [facility]

## 2022-11-07 ENCOUNTER — Other Ambulatory Visit: Payer: Medicare HMO | Admitting: Urology

## 2022-11-11 ENCOUNTER — Ambulatory Visit: Payer: Medicare HMO | Admitting: Pharmacist

## 2022-11-11 DIAGNOSIS — E1142 Type 2 diabetes mellitus with diabetic polyneuropathy: Secondary | ICD-10-CM

## 2022-11-11 DIAGNOSIS — E1129 Type 2 diabetes mellitus with other diabetic kidney complication: Secondary | ICD-10-CM | POA: Diagnosis not present

## 2022-11-11 DIAGNOSIS — E1169 Type 2 diabetes mellitus with other specified complication: Secondary | ICD-10-CM

## 2022-11-11 DIAGNOSIS — R809 Proteinuria, unspecified: Secondary | ICD-10-CM | POA: Diagnosis not present

## 2022-11-12 NOTE — Progress Notes (Unsigned)
11/13/2022 Name: Brandi Calderon MRN: [id number] DOB: [birth year]  No chief complaint on file.   Brandi Calderon is a 57 y.o. year old female who presented for a telephone visit.   They were referred to the pharmacist by their PCP for assistance in managing diabetes, hypertension, and hyperlipidemia.      Subjective:   Care Team: Primary Care Provider: Lorre Munroe, NP; Next Scheduled Visit: 03/19/2023 Pulmonologist: [facility]; Next Scheduled Visit: 01/08/2023 Endocrinologist: Dr. Tedd Sias; Next Scheduled Visit: 11/18/2022  Urologist: [name], MD; Next Scheduled Visit: 12/05/2022  Medication Access/Adherence  Current Pharmacy:  [id number] [place], [place] - [address] [place] [address] Phone: [phone] Fax: [phone]   Patient reports affordability concerns with their medications: No  Patient reports access/transportation concerns to their pharmacy: No  Patient reports adherence concerns with their medications:  No    Today patient reports that she has recently had episodes of cold sweats/dizziness, but denies having checked her blood sugar Patient wonders if due to getting overheated/dehydrated Reports tried drinking regular soda or water, but neither helped; reports resolved with rest Plans to discuss with Endocrinologist at upcoming appointment on 8/19 Advise patient to work on staying hydrated with water, rather than sugary beverages.  Recommend patient carry her blood sugar meter with her to be able to check blood glucose level Counsel on symptoms of/how to manage low blood sugar and encourage patient to carry glucose tablets with her Advise patient to contact provider office if any further dizziness or other symptoms occur  Patient currently using weekly pillbox to organize her medications   Diabetes: Patient followed by [facility] Endocrinology for management of T2DM  Current medications:  Metformin  1000 mg twice daily Ozempic 2 mg weekly on Mondays  Reports having diarrhea with this higher dose of Ozempic. Patient plans to discus with Endocrinologist whether she could reduce dose or switch back to Trulicity Jardiance 25 mg daily glipizide ER 5 mg daily  Medications tried in the past: Trulicity   Recalls recent morning fasting blood sugars ranging 120-140   Reports has reduced soda consumption to ~12 oz/day   Denies symptoms of hypoglycemia   Statin: atorvastatin 80 mg daily   ACE-I: lisinopril 5 mg daily   COPD:   Current medications: Trelegy - 1 puff daily Albuterol - 1-2 puffs every 6 hours as needed Overnight oxygen   Reports using Trelegy consistently and rinsing mouth after each use and using rescue (albuterol) inhaler as needed as directed   Reports doing well with albuterol inhaler now that she is following cleaning instruction for plastic portion of inhaler device      Tobacco Abuse: Reports smoking to ~1 pack/day and working on taper back more Reports discussing setting quit date with her 2 daughters so that they can quit together Reports family supporting her with plan to quit smoking Triggers: boredom, others smoking around her, stress   Objective:  Lab Results  Component Value Date   HGBA1C 7.6 08/01/2022    Lab Results  Component Value Date   CREATININE 0.61 07/18/2022   BUN 12 07/18/2022   NA 142 07/18/2022   K 4.0 07/18/2022   CL 106 07/18/2022   CO2 29 07/18/2022    Lab Results  Component Value Date   CHOL 170 03/14/2022   HDL 44 (L) 03/14/2022   LDLCALC 103 (H) 03/14/2022   TRIG 132 03/14/2022   CHOLHDL 3.9 03/14/2022  Medications Reviewed Today     Reviewed by Manuela Neptune, RPH-CPP (Pharmacist) on 11/13/22 at 2111  Med List Status: <None>   Medication Order Taking? Sig Documenting Provider Last Dose Status Informant  albuterol (VENTOLIN HFA) 108 (90 Base) MCG/ACT inhaler [id number] No INHALE 1-2 PUFFS BY MOUTH EVERY 6  HOURS AS NEEDED FOR WHEEZE OR SHORTNESS OF BREATH  Patient taking differently: 1-2 puffs every 6 (six) hours as needed.   Lorre Munroe, NP Taking Active   aspirin EC 81 MG tablet [id number] No Take 1 tablet (81 mg total) by mouth daily. Swallow whole. Lorre Munroe, NP Taking Active   Aspirin-Salicylamide-Caffeine [facility] HEADACHE POWDER PO) [id number] No Take 4 packets by mouth daily as needed (pain). [provider] Taking Active Self  atorvastatin (LIPITOR) 80 MG tablet [id number] No Take 80 mg by mouth at bedtime. [provider] Taking Active   Blood Glucose Monitoring Suppl (ONETOUCH VERIO FLEX SYSTEM) w/Device KIT [id number] No Use to check blood sugar daily.  DX E11.9 Lorre Munroe, NP Taking Active   clonazePAM (KLONOPIN) 0.5 MG tablet [id number]  TAKE 1 TABLET BY MOUTH EVERY DAY AS NEEDED FOR ANXIETY [name], Salvadore Oxford, NP  Active   DULoxetine (CYMBALTA) 30 MG capsule [id number] No Take 30 mg by mouth at bedtime. [provider] Taking Active   fluticasone (FLONASE) 50 MCG/ACT nasal spray [id number]  SPRAY 2 SPRAYS INTO EACH NOSTRIL EVERY DAY Lorre Munroe, NP  Active   furosemide (LASIX) 40 MG tablet [id number] No TAKE 1 TABLET BY MOUTH EVERY DAY [name], Salvadore Oxford, NP Taking Active   gabapentin (NEURONTIN) 300 MG capsule [id number] No Take 300 mg by mouth at bedtime. [provider] Taking Active Self  glipiZIDE (GLUCOTROL XL) 5 MG 24 hr tablet [id number] No Take 5 mg by mouth daily with breakfast. [provider] Taking Active Self  glucose blood test strip [id number] No Use as instructed Lorre Munroe, NP Taking Active Self  JARDIANCE 25 MG TABS tablet [id number] No Take 25 mg by mouth every morning. [provider] Taking Active Self  Lancets Letta Pate ULTRASOFT) lancets [id number] No Use as instructed Lorre Munroe, NP Taking Active Self  lisinopril (ZESTRIL) 5 MG tablet [id number] No Take 5 mg by mouth every morning. [provider]  Taking Active   metFORMIN (GLUCOPHAGE) 1000 MG tablet [id number] No TAKE 1 TABLET (1,000 MG TOTAL) BY MOUTH 2 (TWO) TIMES DAILY WITH A MEAL. Lorre Munroe, NP Taking Active Self  oxybutynin (DITROPAN XL) 15 MG 24 hr tablet [id number] No Take 1 tablet (15 mg total) by mouth daily. [name], MD Taking Active   OXYGEN [id number] No Inhale 1 L into the lungs at bedtime. [provider] Taking Active Self  pantoprazole (PROTONIX) 40 MG tablet [id number] No TAKE 1 TABLET (40 MG TOTAL) BY MOUTH DAILY AS NEEDED (HEARTBURN). Lorre Munroe, NP Taking Active   polyethylene glycol (MIRALAX) 17 g packet [id number] No Take 17 g by mouth daily.  Patient taking differently: Take 17 g by mouth daily as needed.   Georga Hacking, MD Taking Active   Semaglutide, 2 MG/DOSE, 8 MG/3ML Namon Cirri [id number] No Inject 0.75 mLs (2 mg total) subcutaneously once a week [provider] Taking Active   Dwyane Luo 100-62.5-25 MCG/ACT AEPB [id number]  INHALE 1 PUFF BY MOUTH EVERY DAY [name], Salvadore Oxford, NP  Active   Med List Note Valerie Salts, [place] 06/25/18 2505): UDS 06-25-2018  Assessment/Plan:   Advise patient to work on staying hydrated with water, rather than sugary beverages.  Recommend patient carry her blood sugar meter with her to be able to check blood glucose level Counsel on symptoms of/how to manage low blood sugar and encourage patient to carry glucose tablets with her Advise patient to contact provider office if any further dizziness or other symptoms occur  Diabetes: - Have reviewed dietary modifications including  Have counseled patient on importance of having regular well-balanced meals, while controlling carbohydrate portion sizes Encouraged patient to reduce intake of sugary beverages, to work on drinking water, rather than soda - Patient to monitor home blood sugar in morning before breakfast and at bedtime, keep log of results and have this to review at medical  appointments and to call Endocrinologist sooner for reading outside of established parameters or symptoms    COPD: - Recommend to continue using Trelegy inhaler consistently every day    Tobacco Abuse - Have encouraged patient to consider using nicotine replacement, patches and/or lozenges, as prescribed by Pulmonologist to aid with quitting - Have provided information on 1 800 QUIT NOW support program       Follow Up Plan: Clinic Pharmacist will follow up with patient again on 02/10/2023 at 1:45 pm   Estelle Grumbles, PharmD, [facility] Clinical Pharmacist [facility] [phone]

## 2022-11-13 NOTE — Patient Instructions (Signed)
Goals Addressed             This Visit's Progress    Pharmacy Goals       Our goal A1c is less than 7%. This corresponds with fasting sugars less than 130 and 2 hour after meal sugars less than 180. Please check your blood sugar each morning  Our goal bad cholesterol, or LDL, is less than 70 . This is why it is important to continue taking your atorvastatin  Please follow up with the [place] Quitline for their help with quitting smoking.  The [place] Quitline phone number is: 1-800-784-8669  Feel free to call me with any questions or concerns. I look forward to our next call!  Elisabeth Delles, PharmD, BCACP Clinical Pharmacist [facility] [place] [phone]        

## 2022-11-18 DIAGNOSIS — E1159 Type 2 diabetes mellitus with other circulatory complications: Secondary | ICD-10-CM | POA: Diagnosis not present

## 2022-11-18 DIAGNOSIS — F17200 Nicotine dependence, unspecified, uncomplicated: Secondary | ICD-10-CM | POA: Insufficient documentation

## 2022-11-18 DIAGNOSIS — E1169 Type 2 diabetes mellitus with other specified complication: Secondary | ICD-10-CM | POA: Diagnosis not present

## 2022-11-18 DIAGNOSIS — E1142 Type 2 diabetes mellitus with diabetic polyneuropathy: Secondary | ICD-10-CM | POA: Diagnosis not present

## 2022-11-18 DIAGNOSIS — E1129 Type 2 diabetes mellitus with other diabetic kidney complication: Secondary | ICD-10-CM | POA: Diagnosis not present

## 2022-11-18 DIAGNOSIS — I700 Atherosclerosis of aorta: Secondary | ICD-10-CM | POA: Diagnosis not present

## 2022-11-18 DIAGNOSIS — E785 Hyperlipidemia, unspecified: Secondary | ICD-10-CM | POA: Diagnosis not present

## 2022-11-18 DIAGNOSIS — R110 Nausea: Secondary | ICD-10-CM | POA: Diagnosis not present

## 2022-11-18 DIAGNOSIS — R809 Proteinuria, unspecified: Secondary | ICD-10-CM | POA: Diagnosis not present

## 2022-11-18 DIAGNOSIS — E782 Mixed hyperlipidemia: Secondary | ICD-10-CM | POA: Diagnosis not present

## 2022-11-23 DIAGNOSIS — J449 Chronic obstructive pulmonary disease, unspecified: Secondary | ICD-10-CM | POA: Diagnosis not present

## 2022-11-26 ENCOUNTER — Other Ambulatory Visit: Payer: Self-pay | Admitting: Internal Medicine

## 2022-11-26 DIAGNOSIS — K219 Gastro-esophageal reflux disease without esophagitis: Secondary | ICD-10-CM

## 2022-11-27 NOTE — Telephone Encounter (Signed)
Requested Prescriptions  Pending Prescriptions Disp Refills   pantoprazole (PROTONIX) 40 MG tablet [Pharmacy Med Name: PANTOPRAZOLE SOD DR 40 MG TAB] 90 tablet 0    Sig: TAKE 1 TABLET (40 MG TOTAL) BY MOUTH DAILY AS NEEDED (HEARTBURN).     Gastroenterology: Proton Pump Inhibitors Passed - 11/26/2022  2:31 PM      Passed - Valid encounter within last 12 months    Recent Outpatient Visits           2 weeks ago Hyperlipidemia associated with type 2 diabetes mellitus [facility])   [place] [facility] Delles, Gentry Fitz A, RPH-CPP   2 months ago Aortic atherosclerosis [facility])   [place] [facility] [place], [place] W, NP   3 months ago Type 2 diabetes mellitus with hyperglycemia, without long-term current use of insulin [facility])   [facility] Delles, Gentry Fitz A, RPH-CPP   5 months ago Type 2 diabetes mellitus with hyperglycemia, without long-term current use of insulin [facility])   [place] [facility] Delles, Gentry Fitz A, RPH-CPP   6 months ago Abnormal uterine bleeding   [place] [facility] [place], Salvadore Oxford, NP       Future Appointments             In 1 month Allison Quarry, Ruby Cola, NP [place] [facility] [place]   In 3 months [name], Salvadore Oxford, NP  [facility], [facility]

## 2022-12-02 ENCOUNTER — Other Ambulatory Visit: Payer: Self-pay | Admitting: Internal Medicine

## 2022-12-04 NOTE — Telephone Encounter (Signed)
Requested medication (s) are due for refill today: Yes  Requested medication (s) are on the active medication list: Yes  Last refill:  10/25/22 #30, 0RF  Future visit scheduled: Yes  Notes to clinic:  Unable to refill per protocol, cannot delegate.      Requested Prescriptions  Pending Prescriptions Disp Refills   clonazePAM (KLONOPIN) 0.5 MG tablet [Pharmacy Med Name: CLONAZEPAM 0.5 MG TABLET] 30 tablet 0    Sig: TAKE 1 TABLET BY MOUTH EVERY DAY AS NEEDED FOR ANXIETY     Not Delegated - Psychiatry: Anxiolytics/Hypnotics 2 Failed - 12/02/2022 11:33 AM      Failed - This refill cannot be delegated      Failed - Urine Drug Screen completed in last 360 days      Passed - Patient is not pregnant      Passed - Valid encounter within last 6 months    Recent Outpatient Visits           3 weeks ago Hyperlipidemia associated with type 2 diabetes mellitus (HCC)   [place] [facility] Delles, Gentry Fitz A, RPH-CPP   2 months ago Aortic atherosclerosis [facility])   [place] [facility] [place], [place] W, NP   3 months ago Type 2 diabetes mellitus with hyperglycemia, without long-term current use of insulin [facility])   [place] [facility] Delles, Gentry Fitz A, RPH-CPP   5 months ago Type 2 diabetes mellitus with hyperglycemia, without long-term current use of insulin [facility])   [place] [facility] Delles, Gentry Fitz A, RPH-CPP   6 months ago Abnormal uterine bleeding   [place] [facility] [place], Salvadore Oxford, NP       Future Appointments             In 1 month Allison Quarry, Ruby Cola, NP [place] [facility] [place]   In 3 months [name], Salvadore Oxford, NP [place] [facility], [facility]

## 2022-12-05 ENCOUNTER — Other Ambulatory Visit: Payer: Medicare HMO | Admitting: Urology

## 2022-12-05 ENCOUNTER — Encounter: Payer: Self-pay | Admitting: Urology

## 2022-12-09 ENCOUNTER — Ambulatory Visit: Payer: Self-pay | Admitting: *Deleted

## 2022-12-09 NOTE — Patient Outreach (Signed)
  Care Coordination   Follow Up Visit Note   12/09/2022 Name: Brandi Calderon MRN: [id number] DOB: 1966/02/17  Brandi Calderon is a 57 y.o. year old female who sees [place], Salvadore Oxford, NP for primary care. I spoke with  Brandi Calderon by phone today.  What matters to the patients health and wellness today?  Keep blood sugars stable    Goals Addressed             This Visit's Progress    Effective treatment for bladder cancer   On track     Interventions Today    Flowsheet Row Most Recent Value  Chronic Disease   Chronic disease during today's visit Diabetes, Other  [bladder cancer]  General Interventions   General Interventions Discussed/Reviewed General Interventions Reviewed, Doctor Visits, Labs  Labs Hgb A1c every 3 months  [A1C decreased to 6.9]  Doctor Visits Discussed/Reviewed Doctor Visits Reviewed, Specialist, PCP  [missed urology follow up, reschduled for 9/24.  Pulmonary on 10/9, AWV on 11/8]  PCP/Specialist Visits Compliance with follow-up visit  Exercise Interventions   Exercise Discussed/Reviewed Weight Managment  Weight Management Weight maintenance  Education Interventions   Education Provided Provided Education  Provided Verbal Education On Medication, Blood Sugar Monitoring, When to see the doctor, Nutrition  [Blood sugars decreasing overnight and the am, down to 60s, encouraged to eat bedtime snack. Ozempic still causing GI upset, but still taking.  Will discuss decreasing dose with provider]              SDOH assessments and interventions completed:  No     Care Coordination Interventions:  Yes, provided   Follow up plan: Follow up call scheduled for 11/11    Encounter Outcome:  Patient Visit Completed   Kemper Durie, RN, MSN, [facility] Care Management Care Management Coordinator [phone]

## 2022-12-09 NOTE — Patient Outreach (Signed)
  Care Coordination   12/09/2022 Name: Brandi Calderon MRN: [id number] DOB: 1966/01/02   Care Coordination Outreach Attempts:  An unsuccessful telephone outreach was attempted for a scheduled appointment today.  Follow Up Plan:  Additional outreach attempts will be made to offer the patient care coordination information and services.   Encounter Outcome:  No Answer   Care Coordination Interventions:  No, not indicated    Kemper Durie, RN, MSN, [facility] Care Management Care Management Coordinator [phone]

## 2022-12-20 ENCOUNTER — Other Ambulatory Visit: Payer: Self-pay | Admitting: Internal Medicine

## 2022-12-20 DIAGNOSIS — J449 Chronic obstructive pulmonary disease, unspecified: Secondary | ICD-10-CM

## 2022-12-23 NOTE — Telephone Encounter (Signed)
Requested medication (s) are due for refill today: yes  Requested medication (s) are on the active medication list: yes  Last refill:  10/11/22  Future visit scheduled: no  Notes to clinic:  Medication not assigned to a protocol, review manually.      Requested Prescriptions  Pending Prescriptions Disp Refills   TRELEGY ELLIPTA 100-62.5-25 MCG/ACT AEPB [Pharmacy Med Name: TRELEGY ELLIPTA 100-62.5-25] 60 each 2    Sig: INHALE 1 PUFF BY MOUTH EVERY DAY     Off-Protocol Failed - 12/20/2022  6:54 PM      Failed - Medication not assigned to a protocol, review manually.      Passed - Valid encounter within last 12 months    Recent Outpatient Visits           1 month ago Hyperlipidemia associated with type 2 diabetes mellitus [facility])   [place] [facility] Delles, Gentry Fitz A, RPH-CPP   3 months ago Aortic atherosclerosis [facility])   [place] [facility] [place], [place] W, NP   4 months ago Type 2 diabetes mellitus with hyperglycemia, without long-term current use of insulin [facility])   [place] [facility] Delles, Gentry Fitz A, RPH-CPP   6 months ago Type 2 diabetes mellitus with hyperglycemia, without long-term current use of insulin [facility])   [place] [facility] Delles, Gentry Fitz A, RPH-CPP   7 months ago Abnormal uterine bleeding   [place] [facility] [place], Salvadore Oxford, NP       Future Appointments             In 2 weeks Allison Quarry, Ruby Cola, NP [place] [facility] [place]   In 2 months [name], Salvadore Oxford, NP [place] [facility], [facility]

## 2022-12-24 ENCOUNTER — Ambulatory Visit (INDEPENDENT_AMBULATORY_CARE_PROVIDER_SITE_OTHER): Payer: Medicare HMO | Admitting: Urology

## 2022-12-24 ENCOUNTER — Other Ambulatory Visit: Payer: Self-pay

## 2022-12-24 VITALS — BP 120/67 | HR 70

## 2022-12-24 DIAGNOSIS — J449 Chronic obstructive pulmonary disease, unspecified: Secondary | ICD-10-CM | POA: Diagnosis not present

## 2022-12-24 DIAGNOSIS — Z8551 Personal history of malignant neoplasm of bladder: Secondary | ICD-10-CM | POA: Diagnosis not present

## 2022-12-24 DIAGNOSIS — C672 Malignant neoplasm of lateral wall of bladder: Secondary | ICD-10-CM

## 2022-12-24 DIAGNOSIS — N3289 Other specified disorders of bladder: Secondary | ICD-10-CM

## 2022-12-24 DIAGNOSIS — D494 Neoplasm of unspecified behavior of bladder: Secondary | ICD-10-CM

## 2022-12-24 NOTE — Patient Instructions (Signed)
Transurethral Resection of Bladder Tumor  Transurethral resection of a bladder tumor is the removal (resection) of cancerous tissue (tumor) from the inside wall of the bladder. The bladder is the organ that holds urine. The tumor is removed through the tube that carries urine out of the body (urethra). In a transurethral resection, a thin telescope with a light, a tiny camera, and an electric cutting edge (resectoscope) is passed through the urethra. In men, the opening of the urethra is at the end of the penis. In women, it is just above the opening of the vagina. Tell a health care provider about: Any allergies you have. All medicines you are taking, including vitamins, herbs, eye drops, creams, and over-the-counter medicines. Any problems you or family members have had with anesthetic medicines. Any bleeding problems you have. Any surgeries you have had. Any medical conditions you have, including recent urinary tract infections. Whether you are pregnant or may be pregnant. What are the risks? Generally, this is a safe procedure. However, problems may occur, including: Infection. Bleeding. Allergic reactions to medicines. Damage to nearby structures or organs. Difficulty urinating from blockage of the urethra or not being able to urinate (urinary retention). Deep vein thrombosis. This is a blood clot that can develop in your leg. Recurring cancer. What happens before the procedure? When to stop eating and drinking Follow instructions from your health care provider about what you may eat and drink before your procedure. These may include: 8 hours before your procedure Stop eating most foods. Do not eat meat, fried foods, or fatty foods. Eat only light foods, such as toast or crackers. All liquids are okay except energy drinks and alcohol. 6 hours before your procedure Stop eating. Drink only clear liquids, such as water, clear fruit juice, black coffee, plain tea, and sports  drinks. Do not drink energy drinks or alcohol. 2 hours before your procedure Stop drinking all liquids. You may be allowed to take medicines with small sips of water. Medicines Ask your health care provider about: Changing or stopping your regular medicines. This is especially important if you are taking diabetes medicines or blood thinners. Taking medicines such as aspirin and ibuprofen. These medicines can thin your blood. Do not take these medicines unless your health care provider tells you to take them. Taking over-the-counter medicines, vitamins, herbs, and supplements. General instructions If you will be going home right after the procedure, plan to have a responsible adult: Take you home from the hospital or clinic. You will not be allowed to drive. Care for you for the time you are told. Ask your health care provider what steps will be taken to help prevent infection. These steps may include: Washing skin with a germ-killing soap. Taking antibiotic medicine. Do not use any products that contain nicotine or tobacco for at least 4 weeks before the procedure. These products include cigarettes, chewing tobacco, and vaping devices, such as e-cigarettes. If you need help quitting, ask your health care provider. What happens during the procedure? An IV will be inserted into one of your veins. You will be given one or more of the following: A medicine to help you relax (sedative). A medicine that is injected into your spine to numb the area below and slightly above the injection site (spinal anesthetic). A medicine that is injected into an area of your body to numb everything below the injection site (regional anesthetic). A medicine to make you fall asleep (general anesthetic). Your legs will be placed in foot rests (  stirrups) to open your legs and bend your knees. The resectoscope will be passed through your urethra and into your bladder. The part of your bladder with the tumor will be  resected by the cutting edge of the resectoscope. Fluid will be passed to rinse out the cut tissues (irrigation). The resectoscope will then be taken out. A small, thin tube (catheter) will be passed through your urethra and into your bladder. The catheter will drain urine into a bag outside of your body. The procedure may vary among health care providers and hospitals. What happens after the procedure? Your blood pressure, heart rate, breathing rate, and blood oxygen level will be monitored until you leave the hospital or clinic. You may continue to receive fluids and medicines through an IV. You will be given pain medicine to relieve pain. You will have a catheter to drain your urine. The amount of urine will be measured. If you have blood in your urine, your bladder may be rinsed out by passing fluid through your catheter. You will be encouraged to walk as soon as you can. You may have to wear compression stockings. These stockings help to prevent blood clots and reduce swelling in your legs. If you were given a sedative during the procedure, it can affect you for several hours. Do not drive or operate machinery until your health care provider says that it is safe. Summary Transurethral resection of a bladder tumor is the removal (resection) of a cancerous growth (tumor) on the inside wall of the bladder. To do this procedure, your health care provider uses a thin telescope with a light, a tiny camera, and an electric cutting edge (resectoscope) that is guided to your bladder through your urethra. The part of your bladder that is affected by the tumor will be resected by the cutting edge of the resectoscope. A catheter will be passed through your urethra and into your bladder. The catheter will drain urine into a bag outside of your body. If you will be going home right after the procedure, plan to have a responsible adult take you home from the hospital or clinic. You will not be allowed to  drive. This information is not intended to replace advice given to you by your health care provider. Make sure you discuss any questions you have with your health care provider. Document Revised: 03/23/2021 Document Reviewed: 03/23/2021 Elsevier Patient Education  2024 [employer] Inc.  Steps to Quit Smoking Smoking tobacco is the leading cause of preventable death. It can affect almost every organ in the body. Smoking puts you and those around you at risk for developing many serious chronic diseases. Quitting smoking can be very challenging. Do not get discouraged if you are not successful the first time. Some people need to make many attempts to quit before they achieve long-term success. Do your best to stick to your quit plan, and talk with your health care provider if you have any questions or concerns. How do I get ready to quit? When you decide to quit smoking, create a plan to help you succeed. Before you quit: Pick a date to quit. Set a date within the next 2 weeks to give you time to prepare. Write down the reasons why you are quitting. Keep this list in places where you will see it often. Tell your family, friends, and co-workers that you are quitting. Support from people you are close to can make quitting easier. Talk with your health care provider about your options for quitting  smoking. Find out what treatment options are covered by your health insurance. Identify people, places, things, and activities that make you want to smoke (triggers). Avoid them. What first steps can I take to quit smoking? Throw away all cigarettes at home, at work, and in your car. Throw away smoking accessories, such as Set designer. Clean your car. Make sure to empty the ashtray. Clean your home, including curtains and carpets. What strategies can I use to quit smoking? Talk with your health care provider about combining strategies, such as taking medicines while you are also receiving in-person  counseling. Using these two strategies together makes you more likely to succeed in quitting than if you used either strategy on its own. If you are pregnant or breastfeeding, talk with your health care provider about finding counseling or other support strategies to quit smoking. Do not take medicine to help you quit smoking unless your health care provider tells you to. Quit right away Quit smoking completely, instead of gradually reducing how much you smoke over a period of time. Stopping smoking right away may be more successful than gradually quitting. Attend in-person counseling to help you build problem-solving skills. You are more likely to succeed in quitting if you attend counseling sessions regularly. Even short sessions of 10 minutes can be effective. Take medicine You may take medicines to help you quit smoking. Some medicines require a prescription. You can also purchase over-the-counter medicines. Medicines may have nicotine in them to replace the nicotine in cigarettes. Medicines may: Help to stop cravings. Help to relieve withdrawal symptoms. Your health care provider may recommend: Nicotine patches, gum, or lozenges. Nicotine inhalers or sprays. Non-nicotine medicine that you take by mouth. Find resources Find resources and support systems that can help you quit smoking and remain smoke-free after you quit. These resources are most helpful when you use them often. They include: Online chats with a Veterinary surgeon. Telephone quitlines. Printed Materials engineer. Support groups or group counseling. Text messaging programs. Mobile phone apps or applications. Use apps that can help you stick to your quit plan by providing reminders, tips, and encouragement. Examples of free services include Quit Guide from the CDC and smokefree.gov  What can I do to make it easier to quit?  Reach out to your family and friends for support and encouragement. Call telephone quitlines, such as  1-800-QUIT-NOW, reach out to support groups, or work with a counselor for support. Ask people who smoke to avoid smoking around you. Avoid places that trigger you to smoke, such as bars, parties, or smoke-break areas at work. Spend time with people who do not smoke. Lessen the stress in your life. Stress can be a smoking trigger for some people. To lessen stress, try: Exercising regularly. Doing deep-breathing exercises. Doing yoga. Meditating. What benefits will I see if I quit smoking? Over time, you should start to see positive results, such as: Improved sense of smell and taste. Decreased coughing and sore throat. Slower heart rate. Lower blood pressure. Clearer and healthier skin. The ability to breathe more easily. Fewer sick days. Summary Quitting smoking can be very challenging. Do not get discouraged if you are not successful the first time. Some people need to make many attempts to quit before they achieve long-term success. When you decide to quit smoking, create a plan to help you succeed. Quit smoking right away, not slowly over a period of time. Find resources and support systems that can help you quit smoking and remain smoke-free after you  quit. This information is not intended to replace advice given to you by your health care provider. Make sure you discuss any questions you have with your health care provider. Document Revised: 03/09/2021 Document Reviewed: 03/09/2021 Elsevier Patient Education  2024 [employer].

## 2022-12-24 NOTE — Progress Notes (Signed)
Bladder cancer surveillance note  INDICATION Non-muscle invasive bladder cancer  UROLOGIC HISTORY  Brandi Calderon is a 57 y.o. female who originally presented with gross hematuria and on CT was found to have extensive papillary tumor in the bladder, with 2 large tumors measuring 5 cm and 7 cm respectively.  Initial Diagnosis of Bladder  Year: 06/07/2022 Pathology: HG T1   Treatments for Bladder Cancer 06/07/2022: TURBT for extensive tumor 5 cm and 7 cm, HG T1 07/26/2022: Second look TURBT, no residual tumor, pathology benign  08/2022: Induction BCG x 6  AUA Risk Category High  Cystoscopy Procedure Note:  After informed consent and discussion of the procedure and its risks, Jazlee Ihrig was positioned and prepped in the standard fashion. Cystoscopy was performed with the a flexible cystoscope. The urethra, bladder neck and entire bladder was visualized in a standard fashion, and there were multiple papillary recurrences throughout the bladder, the largest measured approximately 2 cm.  ----------------------------------------------------------------------------------------------------------------- Found to have multiple small papillary recurrences on clinic cystoscopy today  We discussed transurethral resection of bladder tumor (TURBT) and risks and benefits at length. This is typically a 1 to 2-hour procedure done under general anesthesia in the operating room.  A scope is inserted through the urethra and used to resect abnormal tissue within the bladder, which is then sent to the pathologist to determine grade and stage of the tumor.  Risks include bleeding, infection, need for temporary Foley placement, and bladder perforation.  Treatment strategies are based on the type of tumor and depth of invasion.  We briefly reviewed the different treatment pathways for non-muscle invasive and muscle invasive bladder cancer.  She continues to smoke, we again discussed the importance of  smoking cessation and extensive patient information provided  Schedule TURBT with gemcitabine   Legrand Rams, MD 12/24/2022

## 2022-12-24 NOTE — Progress Notes (Unsigned)
Surgical Physician Order Form  [facility]  Dr. Legrand Rams, MD  * Scheduling expectation : Next Available  *Length of Case: 1 hour  *Clearance needed: no  *Anticoagulation Instructions: Hold all anticoagulants  *Aspirin Instructions: Hold Aspirin  *Post-op visit Date/Instructions:  tbd  *Diagnosis: Bladder Tumor  *Procedure:  TURBT 2-5cm (25366)   Additional orders: Gemcitabine 2000mg  bladder instillation  -Admit type: OUTpatient  -Anesthesia: General  -VTE Prophylaxis Standing Order SCD's       Other:   -Standing Lab Orders Per Anesthesia    Lab other: UA&Urine Culture  -Standing Test orders EKG/Chest x-ray per Anesthesia       Test other:   - Medications: Cipro 500 mg IV -Other orders:  N/A

## 2022-12-25 ENCOUNTER — Telehealth: Payer: Self-pay

## 2022-12-25 NOTE — Progress Notes (Signed)
   [facility] Surgical Posting Form  Surgery Date: Date: 01/03/2023  Surgeon: Dr. Legrand Rams, MD  Inpt ( No  )   Outpt (Yes)   Obs ( No  )   Diagnosis: D49.4 Bladder Tumor  -CPT: 16109, [phone]  Surgery: Transurethral Resection of Bladder Tumor with Intravesical Instillation of Gemcitabine  Stop Anticoagulations: Yes and also hold ASA  Cardiac/Medical/Pulmonary Clearance needed: no  Patient is aware to hold Ozempic for 1 week prior to surgery.   *Orders entered into EPIC  Date: 12/25/22   *Case booked in EPIC  Date: 12/24/2022  *Notified pt of Surgery: Date: 12/24/2022  PRE-OP UA & CX: yes, will obtain in clinic on 12/26/2022  *Placed into Prior Authorization Work Angela Nevin Date: 12/25/22  Assistant/laser/rep:No

## 2022-12-25 NOTE — Telephone Encounter (Signed)
Per Dr. Richardo Hanks, Patient is to be scheduled for Transurethral Resection of Bladder Tumor with Intravesical Instillation of Gemcitabine   Brandi Calderon was contacted and possible surgical dates were discussed, Friday October 4th, 2024 was agreed upon for surgery.   Patient was instructed that Dr. Richardo Hanks will require them to provide a pre-op UA & CX prior to surgery. This was ordered and scheduled drop off appointment was made for 12/26/2022.    Patient was directed to call [phone] between 1-3pm the day before surgery to find out surgical arrival time.  Instructions were given not to eat or drink from midnight on the night before surgery and have a driver for the day of surgery. On the surgery day patient was instructed to enter through the Medical Mall entrance of [facility] report the Same Day Surgery desk.   Pre-Admit Testing will be in contact via phone to set up an interview with the anesthesia team to review your history and medications prior to surgery.   Reminder of this information was sent via MyChart to the patient.

## 2022-12-26 ENCOUNTER — Other Ambulatory Visit: Payer: Medicare HMO

## 2022-12-27 ENCOUNTER — Other Ambulatory Visit: Payer: Medicare HMO

## 2022-12-27 DIAGNOSIS — D494 Neoplasm of unspecified behavior of bladder: Secondary | ICD-10-CM

## 2022-12-27 LAB — MICROSCOPIC EXAMINATION

## 2022-12-27 LAB — URINALYSIS, COMPLETE
Bilirubin, UA: NEGATIVE
Ketones, UA: NEGATIVE
Nitrite, UA: NEGATIVE
Protein,UA: NEGATIVE
RBC, UA: NEGATIVE
Specific Gravity, UA: 1.025 (ref 1.005–1.030)
Urobilinogen, Ur: 0.2 mg/dL (ref 0.2–1.0)
pH, UA: 5.5 (ref 5.0–7.5)

## 2022-12-30 LAB — CULTURE, URINE COMPREHENSIVE

## 2022-12-31 ENCOUNTER — Encounter
Admission: RE | Admit: 2022-12-31 | Discharge: 2022-12-31 | Disposition: A | Discharge status: Home / Self Care | Payer: Medicare HMO | Source: Ambulatory Visit | Attending: Urology | Admitting: Urology

## 2022-12-31 ENCOUNTER — Other Ambulatory Visit: Payer: Self-pay

## 2022-12-31 VITALS — Ht 61.0 in | Wt 136.0 lb

## 2022-12-31 DIAGNOSIS — Z01812 Encounter for preprocedural laboratory examination: Secondary | ICD-10-CM | POA: Insufficient documentation

## 2022-12-31 DIAGNOSIS — Z794 Long term (current) use of insulin: Secondary | ICD-10-CM | POA: Diagnosis not present

## 2022-12-31 DIAGNOSIS — I2510 Atherosclerotic heart disease of native coronary artery without angina pectoris: Secondary | ICD-10-CM | POA: Insufficient documentation

## 2022-12-31 DIAGNOSIS — R001 Bradycardia, unspecified: Secondary | ICD-10-CM | POA: Diagnosis not present

## 2022-12-31 DIAGNOSIS — E113293 Type 2 diabetes mellitus with mild nonproliferative diabetic retinopathy without macular edema, bilateral: Secondary | ICD-10-CM | POA: Diagnosis not present

## 2022-12-31 DIAGNOSIS — I700 Atherosclerosis of aorta: Secondary | ICD-10-CM

## 2022-12-31 DIAGNOSIS — E119 Type 2 diabetes mellitus without complications: Secondary | ICD-10-CM | POA: Diagnosis not present

## 2022-12-31 DIAGNOSIS — Z01818 Encounter for other preprocedural examination: Secondary | ICD-10-CM | POA: Diagnosis not present

## 2022-12-31 DIAGNOSIS — H25013 Cortical age-related cataract, bilateral: Secondary | ICD-10-CM | POA: Diagnosis not present

## 2022-12-31 DIAGNOSIS — I2584 Coronary atherosclerosis due to calcified coronary lesion: Secondary | ICD-10-CM | POA: Diagnosis not present

## 2022-12-31 DIAGNOSIS — Z01810 Encounter for preprocedural cardiovascular examination: Secondary | ICD-10-CM | POA: Insufficient documentation

## 2022-12-31 HISTORY — DX: Atherosclerosis of aorta: I70.0

## 2022-12-31 HISTORY — DX: Type 2 diabetes mellitus without complications: E11.9

## 2022-12-31 HISTORY — DX: Malignant neoplasm of bladder, unspecified: C67.9

## 2022-12-31 HISTORY — DX: Neoplasm of unspecified behavior of bladder: D49.4

## 2022-12-31 HISTORY — DX: Gastro-esophageal reflux disease without esophagitis: K21.9

## 2022-12-31 LAB — BASIC METABOLIC PANEL
Anion gap: 9 (ref 5–15)
BUN: 17 mg/dL (ref 6–20)
CO2: 29 mmol/L (ref 22–32)
Calcium: 9.4 mg/dL (ref 8.9–10.3)
Chloride: 102 mmol/L (ref 98–111)
Creatinine, Ser: 1 mg/dL (ref 0.44–1.00)
GFR, Estimated: >60 mL/min (ref >60)
Glucose, Bld: 95 mg/dL (ref 70–99)
Potassium: 3.8 mmol/L (ref 3.5–5.1)
Sodium: 140 mmol/L (ref 135–145)

## 2022-12-31 LAB — HM DIABETES EYE EXAM

## 2022-12-31 LAB — CBC
HCT: 36 % (ref 36.0–46.0)
Hemoglobin: 11.9 g/dL — ABNORMAL LOW (ref 12.0–15.0)
MCH: 29.9 pg (ref 26.0–34.0)
MCHC: 33.1 g/dL (ref 30.0–36.0)
MCV: 90.5 fL (ref 80.0–100.0)
Platelets: 292 10*3/uL (ref 150–400)
RBC: 3.98 MIL/uL (ref 3.87–5.11)
RDW: 15.1 % (ref 11.5–15.5)
WBC: 9.2 10*3/uL (ref 4.0–10.5)
nRBC: 0 % (ref 0.0–0.2)

## 2022-12-31 NOTE — Patient Instructions (Addendum)
Your procedure is scheduled on: Friday, October 4 Report to the Registration Desk on the 1st floor of the [facility] To find out your arrival time, please call [phone] between 1PM - 3PM on: Thursday, October 3 If your arrival time is 6:00 am, do not arrive before that time as the Medical Mall entrance doors do not open until 6:00 am.  REMEMBER: Instructions that are not followed completely may result in serious medical risk, up to and including death; or upon the discretion of your surgeon and anesthesiologist your surgery may need to be rescheduled.  Do not eat or drink after midnight the night before surgery.  No gum chewing or hard candies.  One week prior to surgery: starting today, October 1 Stop aspirin, Anti-inflammatories (NSAIDS) such as Advil, Aleve, Ibuprofen, Motrin, Naproxen, Naprosyn and Aspirin based products such as Excedrin, Goody's Powder, BC Powder. Stop ANY OVER THE COUNTER supplements until after surgery. You may however, continue to take Tylenol if needed for pain up until the day of surgery.  Jardiance - stop 3 days before surgery. Last day to take jardiance is Monday, September 30. Resume AFTER surgery.  Metformin - stop 2 days before surgery. Last day to take metformin is Tuesday, October 1. Resume AFTER surgery.  Semalutide - stop 7 days before surgery. Do NOT take dose on Monday, September 30. Resume AFTER surgery on your regular day, Monday, October 7.  Continue taking all your other prescribed medications until the day of surgery.  TAKE ONLY THESE MEDICATIONS THE MORNING OF SURGERY WITH A SIP OF WATER:  Clonazepam (Klonopin) if needed for anxiety Flonase nasal spray Oxybutynin Pantoprazole (Protonix) - (take one the night before and one on the morning of surgery - helps to prevent nausea after surgery.) Trelegy inhaler  Use inhalers on the day of surgery and bring your albuterol inhaler to the hospital.  No Alcohol for 24 hours before or after  surgery.  No Smoking including e-cigarettes for 24 hours before surgery.  No chewable tobacco products for at least 6 hours before surgery.  No nicotine patches on the day of surgery.  Do not use any "recreational" drugs for at least a week (preferably 2 weeks) before your surgery.  Please be advised that the combination of cocaine and anesthesia may have negative outcomes, up to and including death. If you test positive for cocaine, your surgery will be cancelled.  On the morning of surgery brush your teeth with toothpaste and water, you may rinse your mouth with mouthwash if you wish. Do not swallow any toothpaste or mouthwash.  Do not wear jewelry, make-up, hairpins, clips or nail polish.  For welded (permanent) jewelry: bracelets, anklets, waist bands, etc.  Please have this removed prior to surgery.  If it is not removed, there is a chance that hospital personnel will need to cut it off on the day of surgery.  Do not wear lotions, powders, or perfumes.   Do not shave body hair from the neck down 48 hours before surgery.  Contact lenses, hearing aids and dentures may not be worn into surgery.  Do not bring valuables to the hospital. [facility] is not responsible for any missing/lost belongings or valuables.   Notify your doctor if there is any change in your medical condition (cold, fever, infection).  Wear comfortable clothing (specific to your surgery type) to the hospital.  After surgery, you can help prevent lung complications by doing breathing exercises.  Take deep breaths and cough every 1-2  hours.   If you are being discharged the day of surgery, you will not be allowed to drive home. You will need a responsible individual to drive you home and stay with you for 24 hours after surgery.   If you are taking public transportation, you will need to have a responsible individual with you.  Please call the Pre-admissions Testing Dept. at [phone] if you have any  questions about these instructions.  Surgery Visitation Policy:  Patients having surgery or a procedure may have two visitors.  Children under the age of 76 must have an adult with them who is not the patient.

## 2023-01-01 ENCOUNTER — Other Ambulatory Visit: Payer: Self-pay | Admitting: Internal Medicine

## 2023-01-02 NOTE — Telephone Encounter (Signed)
Requested medication (s) are due for refill today: Yes  Requested medication (s) are on the active medication list: Yes  Last refill:  12/05/22  Future visit scheduled: No  Notes to clinic:  Not delegated.    Requested Prescriptions  Pending Prescriptions Disp Refills   clonazePAM (KLONOPIN) 0.5 MG tablet [Pharmacy Med Name: CLONAZEPAM 0.5 MG TABLET] 30 tablet 0    Sig: TAKE 1 TABLET BY MOUTH EVERY DAY AS NEEDED FOR ANXIETY     Not Delegated - Psychiatry: Anxiolytics/Hypnotics 2 Failed - 01/01/2023  1:50 PM      Failed - This refill cannot be delegated      Failed - Urine Drug Screen completed in last 360 days      Passed - Patient is not pregnant      Passed - Valid encounter within last 6 months    Recent Outpatient Visits           1 month ago Hyperlipidemia associated with type 2 diabetes mellitus (HCC)   [facility] Delles, Gentry Fitz A, RPH-CPP   3 months ago Aortic atherosclerosis [facility])   [facility] [place], [place] W, NP   4 months ago Type 2 diabetes mellitus with hyperglycemia, without long-term current use of insulin [facility])   [place] [facility] Delles, Gentry Fitz A, RPH-CPP   6 months ago Type 2 diabetes mellitus with hyperglycemia, without long-term current use of insulin [facility])   [place] Delles, Gentry Fitz A, RPH-CPP   7 months ago Abnormal uterine bleeding   [place] [facility] [place], Salvadore Oxford, NP       Future Appointments             In 6 days Allison Quarry, Ruby Cola, NP [facility] [place]   In 2 months [name], Salvadore Oxford, NP [place] [facility]

## 2023-01-03 ENCOUNTER — Ambulatory Visit: Payer: Medicare HMO | Admitting: Urgent Care

## 2023-01-03 ENCOUNTER — Encounter
Admission: RE | Disposition: A | Discharge status: Home / Self Care | Payer: Self-pay | Source: Ambulatory Visit | Attending: Urology

## 2023-01-03 ENCOUNTER — Ambulatory Visit
Admission: RE | Admit: 2023-01-03 | Discharge: 2023-01-03 | Disposition: A | Discharge status: Home / Self Care | Payer: Medicare HMO | Source: Ambulatory Visit | Attending: Urology | Admitting: Urology

## 2023-01-03 ENCOUNTER — Encounter: Payer: Self-pay | Admitting: Urology

## 2023-01-03 ENCOUNTER — Ambulatory Visit: Payer: Medicare HMO | Admitting: Certified Registered"

## 2023-01-03 ENCOUNTER — Other Ambulatory Visit: Payer: Self-pay

## 2023-01-03 DIAGNOSIS — D494 Neoplasm of unspecified behavior of bladder: Secondary | ICD-10-CM

## 2023-01-03 DIAGNOSIS — Z7985 Long-term (current) use of injectable non-insulin antidiabetic drugs: Secondary | ICD-10-CM | POA: Insufficient documentation

## 2023-01-03 DIAGNOSIS — F17210 Nicotine dependence, cigarettes, uncomplicated: Secondary | ICD-10-CM | POA: Insufficient documentation

## 2023-01-03 DIAGNOSIS — Z01812 Encounter for preprocedural laboratory examination: Secondary | ICD-10-CM

## 2023-01-03 DIAGNOSIS — E119 Type 2 diabetes mellitus without complications: Secondary | ICD-10-CM | POA: Insufficient documentation

## 2023-01-03 DIAGNOSIS — I2510 Atherosclerotic heart disease of native coronary artery without angina pectoris: Secondary | ICD-10-CM | POA: Diagnosis not present

## 2023-01-03 DIAGNOSIS — C679 Malignant neoplasm of bladder, unspecified: Secondary | ICD-10-CM | POA: Insufficient documentation

## 2023-01-03 DIAGNOSIS — G4730 Sleep apnea, unspecified: Secondary | ICD-10-CM | POA: Insufficient documentation

## 2023-01-03 DIAGNOSIS — Z794 Long term (current) use of insulin: Secondary | ICD-10-CM

## 2023-01-03 DIAGNOSIS — I10 Essential (primary) hypertension: Secondary | ICD-10-CM | POA: Insufficient documentation

## 2023-01-03 DIAGNOSIS — J4489 Other specified chronic obstructive pulmonary disease: Secondary | ICD-10-CM | POA: Insufficient documentation

## 2023-01-03 HISTORY — PX: TRANSURETHRAL RESECTION OF BLADDER TUMOR: SHX2575

## 2023-01-03 HISTORY — PX: BLADDER INSTILLATION: SHX6893

## 2023-01-03 LAB — GLUCOSE, CAPILLARY
Glucose-Capillary: 148 mg/dL — ABNORMAL HIGH (ref 70–99)
Glucose-Capillary: 134 mg/dL — ABNORMAL HIGH (ref 70–99)

## 2023-01-03 SURGERY — TURBT (TRANSURETHRAL RESECTION OF BLADDER TUMOR)
Anesthesia: General

## 2023-01-03 MED ORDER — ROCURONIUM BROMIDE 100 MG/10ML IV SOLN
INTRAVENOUS | Status: DC | PRN
  Administered 2023-01-03: 50 mg via INTRAVENOUS

## 2023-01-03 MED ORDER — MIDAZOLAM HCL 2 MG/2ML IJ SOLN
INTRAMUSCULAR | Status: AC
  Filled 2023-01-03: qty 2

## 2023-01-03 MED ORDER — CIPROFLOXACIN IN D5W 400 MG/200ML IV SOLN: 400.0000 mg | Freq: Once | INTRAVENOUS | Status: DC

## 2023-01-03 MED ORDER — CHLORHEXIDINE GLUCONATE 0.12 % MT SOLN
15.0000 mL | Freq: Once | OROMUCOSAL | Status: AC
  Administered 2023-01-03: 15 mL via OROMUCOSAL

## 2023-01-03 MED ORDER — LIDOCAINE HCL (CARDIAC) PF 100 MG/5ML IV SOSY
PREFILLED_SYRINGE | INTRAVENOUS | Status: DC | PRN
  Administered 2023-01-03: 60 mg via INTRAVENOUS

## 2023-01-03 MED ORDER — FENTANYL CITRATE (PF) 100 MCG/2ML IJ SOLN
25.0000 ug | INTRAMUSCULAR | Status: DC | PRN
  Administered 2023-01-03 (×2): 50 ug via INTRAVENOUS

## 2023-01-03 MED ORDER — ONDANSETRON HCL 4 MG/2ML IJ SOLN: 4.0000 mg | Freq: Once | INTRAMUSCULAR | Status: DC | PRN

## 2023-01-03 MED ORDER — PROPOFOL 10 MG/ML IV BOLUS
INTRAVENOUS | Status: DC | PRN
  Administered 2023-01-03: 130 mg via INTRAVENOUS

## 2023-01-03 MED ORDER — DEXAMETHASONE SODIUM PHOSPHATE 10 MG/ML IJ SOLN
INTRAMUSCULAR | Status: DC | PRN
  Administered 2023-01-03: 10 mg via INTRAVENOUS

## 2023-01-03 MED ORDER — FENTANYL CITRATE (PF) 100 MCG/2ML IJ SOLN
INTRAMUSCULAR | Status: AC
  Filled 2023-01-03: qty 2

## 2023-01-03 MED ORDER — SODIUM CHLORIDE 0.9 % IR SOLN
Status: DC | PRN
  Administered 2023-01-03: 6000 mL

## 2023-01-03 MED ORDER — ONDANSETRON HCL 4 MG/2ML IJ SOLN
INTRAMUSCULAR | Status: AC
  Filled 2023-01-03: qty 2

## 2023-01-03 MED ORDER — CIPROFLOXACIN IN D5W 400 MG/200ML IV SOLN
INTRAVENOUS | Status: AC
  Filled 2023-01-03: qty 200

## 2023-01-03 MED ORDER — GEMCITABINE CHEMO FOR BLADDER INSTILLATION 2000 MG
2000.0000 mg | Freq: Once | INTRAVENOUS | Status: AC
  Administered 2023-01-03: 2000 mg via INTRAVESICAL
  Filled 2023-01-03: qty 2000

## 2023-01-03 MED ORDER — SUGAMMADEX SODIUM 200 MG/2ML IV SOLN
INTRAVENOUS | Status: DC | PRN
  Administered 2023-01-03: 300 mg via INTRAVENOUS

## 2023-01-03 MED ORDER — FENTANYL CITRATE (PF) 100 MCG/2ML IJ SOLN
INTRAMUSCULAR | Status: DC | PRN
  Administered 2023-01-03 (×2): 50 ug via INTRAVENOUS

## 2023-01-03 MED ORDER — OXYCODONE HCL 5 MG/5ML PO SOLN: 5.0000 mg | Freq: Once | ORAL | Status: DC | PRN

## 2023-01-03 MED ORDER — OXYCODONE HCL 5 MG PO TABS: 5.0000 mg | ORAL_TABLET | Freq: Once | ORAL | Status: DC | PRN

## 2023-01-03 MED ORDER — TRAMADOL HCL 50 MG PO TABS
25.0000 mg | ORAL_TABLET | Freq: Four times a day (QID) | ORAL | 0 refills | Status: AC | PRN
Start: 2023-01-03 — End: 2023-01-06

## 2023-01-03 MED ORDER — ONDANSETRON HCL 4 MG/2ML IJ SOLN
INTRAMUSCULAR | Status: DC | PRN
  Administered 2023-01-03: 4 mg via INTRAVENOUS

## 2023-01-03 MED ORDER — CHLORHEXIDINE GLUCONATE 0.12 % MT SOLN
OROMUCOSAL | Status: AC
  Filled 2023-01-03: qty 15

## 2023-01-03 MED ORDER — ACETAMINOPHEN 10 MG/ML IV SOLN: 1000.0000 mg | Freq: Once | INTRAVENOUS | Status: DC | PRN

## 2023-01-03 MED ORDER — ORAL CARE MOUTH RINSE: 15.0000 mL | Freq: Once | OROMUCOSAL | Status: AC

## 2023-01-03 MED ORDER — SODIUM CHLORIDE 0.9 % IV SOLN: INTRAVENOUS | Status: DC

## 2023-01-03 MED ORDER — DEXAMETHASONE SODIUM PHOSPHATE 10 MG/ML IJ SOLN
INTRAMUSCULAR | Status: AC
  Filled 2023-01-03: qty 1

## 2023-01-03 MED ORDER — GENTAMICIN SULFATE 40 MG/ML IJ SOLN
2.0000 mg/kg | Freq: Once | INTRAVENOUS | Status: AC
  Administered 2023-01-03: 120 mg via INTRAVENOUS
  Filled 2023-01-03: qty 3

## 2023-01-03 MED ORDER — PROPOFOL 10 MG/ML IV BOLUS
INTRAVENOUS | Status: AC
  Filled 2023-01-03: qty 20

## 2023-01-03 MED ORDER — LACTATED RINGERS IV SOLN: INTRAVENOUS | Status: DC | PRN

## 2023-01-03 MED ORDER — MIDAZOLAM HCL 2 MG/2ML IJ SOLN
INTRAMUSCULAR | Status: DC | PRN
  Administered 2023-01-03: 2 mg via INTRAVENOUS

## 2023-01-03 SURGICAL SUPPLY — 23 items
BAG DRAIN SIEMENS DORNER NS (MISCELLANEOUS) ×1 IMPLANT
BAG DRN NS LF (MISCELLANEOUS) ×1
BAG DRN RND TRDRP ANRFLXCHMBR (UROLOGICAL SUPPLIES) ×1
BAG URINE DRAIN 2000ML AR STRL (UROLOGICAL SUPPLIES) ×1 IMPLANT
BRUSH SCRUB EZ 4% CHG (MISCELLANEOUS) ×1 IMPLANT
CATH FOL 2WAY LX 18X30 (CATHETERS) ×1 IMPLANT
DRAPE UTILITY 15X26 TOWEL STRL (DRAPES) ×1 IMPLANT
DRSG TELFA 3X4 N-ADH STERILE (GAUZE/BANDAGES/DRESSINGS) ×1 IMPLANT
GLOVE BIOGEL PI IND STRL 7.5 (GLOVE) ×1 IMPLANT
GOWN STRL REUS W/ TWL LRG LVL3 (GOWN DISPOSABLE) ×1 IMPLANT
GOWN STRL REUS W/ TWL XL LVL3 (GOWN DISPOSABLE) ×1 IMPLANT
GOWN STRL REUS W/TWL LRG LVL3 (GOWN DISPOSABLE) ×1
GOWN STRL REUS W/TWL XL LVL3 (GOWN DISPOSABLE) ×1
IV NS IRRIG 3000ML ARTHROMATIC (IV SOLUTION) ×2 IMPLANT
KIT TURNOVER CYSTO (KITS) ×1 IMPLANT
LOOP CUT BIPOLAR 24F LRG (ELECTROSURGICAL) IMPLANT
NDL SAFETY ECLIP 18X1.5 (MISCELLANEOUS) ×1 IMPLANT
PACK CYSTO AR (MISCELLANEOUS) ×1 IMPLANT
SET IRRIG Y TYPE TUR BLADDER L (SET/KITS/TRAYS/PACK) ×1 IMPLANT
SURGILUBE 2OZ TUBE FLIPTOP (MISCELLANEOUS) ×1 IMPLANT
SYR TOOMEY IRRIG 70ML (MISCELLANEOUS) ×1
SYRINGE TOOMEY IRRIG 70ML (MISCELLANEOUS) ×1 IMPLANT
WATER STERILE IRR 500ML POUR (IV SOLUTION) ×1 IMPLANT

## 2023-01-03 NOTE — Discharge Instructions (Addendum)

## 2023-01-03 NOTE — Anesthesia Procedure Notes (Signed)
Procedure Name: Intubation Date/Time: 01/03/2023 9:12 AM  Performed by: Rodney Booze, CRNAPre-anesthesia Checklist: Patient identified, Emergency Drugs available, Suction available and Patient being monitored Patient Re-evaluated:Patient Re-evaluated prior to induction Oxygen Delivery Method: Circle system utilized Preoxygenation: Pre-oxygenation with 100% oxygen Induction Type: IV induction Ventilation: Mask ventilation without difficulty Laryngoscope Size: Miller and 2 Grade View: Grade I Tube type: Oral Tube size: 7.0 mm Number of attempts: 1 Airway Equipment and Method: Stylet and Oral airway Placement Confirmation: ETT inserted through vocal cords under direct vision, positive ETCO2 and breath sounds checked- equal and bilateral Secured at: 21 cm Tube secured with: Tape Dental Injury: Teeth and Oropharynx as per pre-operative assessment

## 2023-01-03 NOTE — Anesthesia Postprocedure Evaluation (Signed)
Anesthesia Post Note  Patient: Brandi Calderon  Procedure(s) Performed: TRANSURETHRAL RESECTION OF BLADDER TUMOR (TURBT) BLADDER INSTILLATION OF GEMCITABINE  Patient location during evaluation: PACU Anesthesia Type: General Level of consciousness: awake and alert Pain management: pain level controlled Vital Signs Assessment: post-procedure vital signs reviewed and stable Respiratory status: spontaneous breathing, nonlabored ventilation, respiratory function stable and patient connected to nasal cannula oxygen Cardiovascular status: blood pressure returned to baseline and stable Postop Assessment: no apparent nausea or vomiting Anesthetic complications: no   No notable events documented.   Last Vitals:  Vitals:   01/03/23 1045 01/03/23 1100  BP: (!) 143/68 (!) 158/71  Pulse: (!) 55 60  Resp: 15 10  Temp:  36.6 C  SpO2: 96% 100%    Last Pain:  Vitals:   01/03/23 1100  TempSrc:   PainSc: 0-No pain                 Corinda Gubler

## 2023-01-03 NOTE — Transfer of Care (Signed)
Immediate Anesthesia Transfer of Care Note  Patient: Brandi Calderon  Procedure(s) Performed: TRANSURETHRAL RESECTION OF BLADDER TUMOR (TURBT) BLADDER INSTILLATION OF GEMCITABINE  Patient Location: PACU  Anesthesia Type:General  Level of Consciousness: awake, alert , and oriented  Airway & Oxygen Therapy: Patient Spontanous Breathing and Patient connected to face mask oxygen  Post-op Assessment: Report given to RN and Post -op Vital signs reviewed and stable  Post vital signs: stable  Last Vitals:  Vitals Value Taken Time  BP 145/77 01/03/23 0957  Temp    Pulse 68 01/03/23 0959  Resp 15 01/03/23 0959  SpO2 100 % 01/03/23 0959  Vitals shown include unfiled device data.  Last Pain:  Vitals:   01/03/23 0809  TempSrc: Temporal  PainSc: 0-No pain         Complications: No notable events documented.

## 2023-01-03 NOTE — Progress Notes (Signed)
  Perioperative Services Pre-Admission/Anesthesia Testing    Date: 01/03/23  Name: Brandi Calderon MRN:   [id number]  Re: GLP-1 clearance and provider recommendations   Planned Surgical Procedure(s):    Case: [id number] Anesthesia Start Date/Time: 01/03/23 0901   Procedures:      TRANSURETHRAL RESECTION OF BLADDER TUMOR (TURBT)     BLADDER INSTILLATION OF GEMCITABINE   Anesthesia type: General   Pre-op diagnosis: Bladder Tumor   Location: [facility] OR ROOM 10 / [facility]   Surgeons: [name], MD      Clinical Notes:  Patient is scheduled for the above procedure with the indicated provider/surgeon. In review of her medication reconciliation it was noted that patient is on a prescribed GLP-1 medication. Per guidelines issued by the American Society of Anesthesiologists (ASA), it is recommended that these medications be held for 7 days prior to the patient undergoing any type of elective surgical procedure. The patient is taking the following GLP-1 medication:  [x]  SEMAGLUTIDE   []  EXENATIDE  []  LIRAGLUTIDE   []  LIXISENATIDE  []  DULAGLUTIDE     []  TIRZEPATIDE (GLP-1/GIP)  Reached out to prescribing provider Tedd Sias, MD) to make them aware of the guidelines from anesthesia. Given that this patient takes the prescribed GLP-1 medication for her  diabetes diagnosis, rather than for weight loss, recommendations from the prescribing provider were solicited. Prescribing provider made aware of the following so that informed decision/POC can be developed for this patient that may be taking medications belonging to these drug classes:  Oral GLP-1 medications will be held 1 day prior to surgery.  Injectable GLP-1 medications will be held 7 days prior to surgery.  Metformin is routinely held 48 hours prior to surgery due to renal concerns, potential need for contrasted imaging perioperatively, and the potential for tissue hypoxia leading to drug induced lactic  acidosis.  All SGLT2i medications are held 72 hours prior to surgery as they can be associated with the increased potential for developing euglycemic diabetic ketoacidosis (EDKA).   Impression and Plan:  Brandi Calderon is on a prescribed GLP-1 medication, which induces the known side effect of decreased gastric emptying. Efforts are bring made to mitigate the risk of perioperative hyperglycemic events, as elevated blood glucose levels have been found to contribute to intra/postoperative complications. Additionally, hyperglycemic extremes can potentially necessitate the postponing of a patient's elective case in order to better optimize perioperative glycemic control, again with the aforementioned guidelines in place. With this in mind, recommendations have been sought from the prescribing provider, who has cleared patient to proceed with holding the prescribed GLP-1 as per the guidelines from the ASA.   Provider recommending: no further recommendations received from the prescribing provider.  Copy of signed clearance and recommendations placed on patient's chart for inclusion in their medical record and for review by the surgical/anesthetic team on the day of her procedure.   Quentin Mulling, MSN, APRN, FNP-C, CEN [facility]  Perioperative Services Nurse Practitioner Phone: [phone] Fax: [phone] 01/03/23 2:10 PM  NOTE: This note has been prepared using Scientist, clinical (histocompatibility and immunogenetics). Despite my best ability to proofread, there is always the potential that unintentional transcriptional errors may still occur from this process.

## 2023-01-03 NOTE — Anesthesia Preprocedure Evaluation (Addendum)
Anesthesia Evaluation  Patient identified by MRN, date of birth, ID band Patient awake    Reviewed: Allergy & Precautions, NPO status , Patient's Chart, lab work & pertinent test results  History of Anesthesia Complications Negative for: history of anesthetic complications  Airway Mallampati: III  TM Distance: >3 FB Neck ROM: full    Dental no notable dental hx. (+) Upper Dentures, Lower Dentures   Pulmonary asthma , sleep apnea and Oxygen sleep apnea , COPD,  COPD inhaler, Current Smoker and Patient abstained from smoking.    + decreased breath sounds      Cardiovascular Exercise Tolerance: Good METShypertension, Pt. on medications + CAD  (-) Past MI Normal cardiovascular exam(-) dysrhythmias  Rhythm:Regular Rate:Normal - Systolic murmurs    Neuro/Psych  Headaches PSYCHIATRIC DISORDERS Anxiety Depression       GI/Hepatic Neg liver ROS,GERD  Medicated,,  Endo/Other  diabetes, Type 2  On GLP1 agonist , last taken over 1 week ago. No GI symptoms today  Renal/GU negative Renal ROS     Musculoskeletal  (+) Arthritis ,    Abdominal Normal abdominal exam  (+)   Peds negative pediatric ROS (+)  Hematology negative hematology ROS (+)   Anesthesia Other Findings Past Medical History: No date: Anxiety No date: Arthritis No date: Asthma No date: Back pain No date: COPD (chronic obstructive pulmonary disease) (HCC) No date: Depression No date: Diabetes mellitus No date: Headache No date: History of COVID-19 No date: Hyperlipidemia No date: Hypertension No date: Neuromuscular disorder (HCC) No date: Sleep apnea No date: Tendonitis  Past Surgical History: No date: CESAREAN SECTION No date: CESAREAN SECTION     Comment:  3 times No date: COLONOSCOPY No date: cyst on neck     Comment:  as a baby/right side  BMI    Body Mass Index: 24.58 kg/m      Reproductive/Obstetrics negative OB ROS                              Anesthesia Physical Anesthesia Plan  ASA: 3  Anesthesia Plan: General   Post-op Pain Management: Ofirmev IV (intra-op)*   Induction: Intravenous  PONV Risk Score and Plan: 3 and Ondansetron, Dexamethasone, Midazolam and Treatment may vary due to age or medical condition  Airway Management Planned: Oral ETT  Additional Equipment: None  Intra-op Plan:   Post-operative Plan: Extubation in OR  Informed Consent: I have reviewed the patients History and Physical, chart, labs and discussed the procedure including the risks, benefits and alternatives for the proposed anesthesia with the patient or authorized representative who has indicated his/her understanding and acceptance.     Dental Advisory Given  Plan Discussed with: CRNA and Surgeon  Anesthesia Plan Comments: (Discussed risks of anesthesia with patient, including PONV, sore throat, lip/dental/eye damage. Rare risks discussed as well, such as cardiorespiratory and neurological sequelae, and allergic reactions. Discussed the role of CRNA in patient's perioperative care. Patient understands.)       Anesthesia Quick Evaluation

## 2023-01-03 NOTE — Op Note (Signed)
Date of procedure: 01/03/23  Preoperative diagnosis:  Bladder cancer  Postoperative diagnosis:  Same  Procedure: TURBT, medium, 3 cm Instillation of intravesical gemcitabine  Surgeon: Legrand Rams, MD  Anesthesia: General  Complications: None  Intraoperative findings:  Numerous papillary tumors throughout the bladder, >10.  Largest was 3 cm at the right posterior wall, ureteral orifices not involved with tumor. All visible tumor resected, excellent hemostasis, gemcitabine instilled  EBL: Minimal  Specimens: Bladder tumor   Drains: 18 French Foley  Indication: Brandi Calderon is a 57 y.o. patient with history of high-grade bladder cancer found to have recent recurrence after induction BCG, unfortunately she continues to smoke.  After reviewing the management options for treatment, they elected to proceed with the above surgical procedure(s). We have discussed the potential benefits and risks of the procedure, side effects of the proposed treatment, the likelihood of the patient achieving the goals of the procedure, and any potential problems that might occur during the procedure or recuperation. Informed consent has been obtained.  Description of procedure:  The patient was taken to the operating room and general anesthesia was induced. SCDs were placed for DVT prophylaxis. The patient was placed in the dorsal lithotomy position, prepped and draped in the usual sterile fashion, and preoperative antibiotics(gentamicin) were administered. A preoperative time-out was performed.   A 26 French resectoscope was used to intubate the urethra and thorough cystoscopy was performed.  The ureteral orifices were orthotopic, there were mild bladder trabeculations.  There were numerous papillary bladder tumors scattered throughout the bladder, the largest measured 3 cm at the right posterior wall.  All other tumors were <1cm.  Numerous small tumors located just inside the bladder around the  bladder neck.  The bipolar resectoscope with a large loop was used to remove all abnormal appearing tissue.  The base of all tumors was fulgurated and there was excellent hemostasis.  All tumor was sent for pathology.  With the bladder decompressed, no bleeding was noted.  A 70 degree scope was inserted and there was no evidence of residual tumor or bleeding surrounding the bladder neck.  An 54 French Foley passed in the bladder with return of clear urine.  This was irrigated with 500 mL of sterile water.  The bladder was drained, and 2 g / 50 mL gemcitabine was instilled and clamped.  Disposition: Stable to PACU  Plan: Unclamp Foley at 10:45 AM and allow gemcitabine to drain, Foley can be removed prior to discharge Will call with pathology results  Legrand Rams, MD

## 2023-01-03 NOTE — H&P (Signed)
01/03/23 8:49 AM   Pollyann Samples [facility] 17-Mar-1966 [id number]  CC: Bladder cancer  HPI: 57 year old female who originally presented with gross hematuria and found to have extensive bladder tumor 7+ centimeters and underwent TURBT in March 2024 with resection of all visible tumor, pathology showed HG T1 urothelial cell carcinoma with no evidence of muscle invasion, second look TURBT showed no residual disease.  She completed induction BCG.  Unfortunately, despite extensive counseling, she continues to smoke.  On surveillance cystoscopy in September 2024 found to have recurrence of multiple papillary tumors throughout the bladder, largest measured approximately 2 cm.   PMH: Past Medical History:  Diagnosis Date   Anxiety    Aortic atherosclerosis (HCC)    Arthritis    Asthma    Back pain    Bladder cancer (HCC)    Bladder tumor    COPD (chronic obstructive pulmonary disease) (HCC)    Depression    GERD (gastroesophageal reflux disease)    Headache    History of COVID-19    Hyperlipidemia    Hypertension    Neuromuscular disorder (HCC)    Sleep apnea    Tendonitis    Type 2 diabetes mellitus without complication (HCC)    Urinary tract infection due to extended-spectrum beta lactamase (ESBL) producing Escherichia coli 05/22/2022    Surgical History: Past Surgical History:  Procedure Laterality Date   CESAREAN SECTION     3 times   COLONOSCOPY     cyst on neck     as a baby/right side   CYSTOSCOPY WITH BIOPSY N/A 07/26/2022   Procedure: CYSTOSCOPY WITH BLADDER BIOPSY;  Surgeon: [name], MD;  Location: [facility] ORS;  Service: Urology;  Laterality: N/A;   TRANSURETHRAL RESECTION OF BLADDER TUMOR N/A 06/07/2022   Procedure: TRANSURETHRAL RESECTION OF BLADDER TUMOR (TURBT);  Surgeon: [name], MD;  Location: [facility] ORS;  Service: Urology;  Laterality: N/A;     Family History: Family History  Problem Relation Age of Onset   Diabetes Mother    Kidney disease  Father    Diabetes Father    Dementia Father    Diabetes Sister    Diabetes Brother    Diabetes Brother    Colon cancer Neg Hx    Esophageal cancer Neg Hx    Stomach cancer Neg Hx    Rectal cancer Neg Hx     Social History:  reports that she has been smoking cigarettes. She has a 39 pack-year smoking history. She has been exposed to tobacco smoke. She has never used smokeless tobacco. She reports current drug use. Drug: Marijuana. She reports that she does not drink alcohol.  Physical Exam: BP 124/71   Pulse 65   Temp 97.8 F (36.6 C) (Temporal)   Resp 16   Ht 5\' 1"  (1.549 m)   Wt 61.7 kg   LMP 12/01/2017   SpO2 100%   BMI 25.70 kg/m    Constitutional:  Alert and oriented, No acute distress. Cardiovascular: Regular rate and rhythm Respiratory: Clear to auscultation bilaterally GI: Abdomen is soft, nontender, nondistended, no abdominal masses   Laboratory Data: Urine culture 12/27/2022 <25k mixed flora  Assessment & Plan:   57 year old female with recurrent bladder cancer, continues to smoke, with recurrence found on cystoscopy.  We have also discussed consideration of early neoadjuvant chemotherapy and early cystectomy with her high risk disease, but she adamantly refused.  We discussed transurethral resection of bladder tumor (TURBT) and risks and benefits at length. This is  typically a 1 to 2-hour procedure done under general anesthesia in the operating room.  A scope is inserted through the urethra and used to resect abnormal tissue within the bladder, which is then sent to the pathologist to determine grade and stage of the tumor.  Risks include bleeding, infection, need for temporary Foley placement, and bladder perforation.  Treatment strategies are based on the type of tumor and depth of invasion.  We briefly reviewed the different treatment pathways for non-muscle invasive and muscle invasive bladder cancer.  TURBT with gemcitabine today  Legrand Rams,  MD 01/03/2023  [facility] [address] [place], [place] [address] [phone]

## 2023-01-04 ENCOUNTER — Encounter: Payer: Self-pay | Admitting: Urology

## 2023-01-06 LAB — SURGICAL PATHOLOGY

## 2023-01-07 ENCOUNTER — Telehealth: Payer: Self-pay

## 2023-01-07 NOTE — Telephone Encounter (Signed)
-----   Message from [name] sent at 01/07/2023 10:32 AM EDT ----- TURBT results showed bladder cancer similar to what she had previously likely expected, the good news was there was no evidence of muscle invasion.  I would strongly recommend repeating the induction BCG, this can be started in 4 weeks from now, with follow-up cystoscopy in 4 months  Thanks  Legrand Rams, MD 01/07/2023

## 2023-01-07 NOTE — Telephone Encounter (Signed)
Called pt informed her of the information below. Pt voiced understanding. Cysto scheduled. Jessica messaged about BCG.

## 2023-01-08 ENCOUNTER — Ambulatory Visit: Payer: Medicare HMO | Admitting: Nurse Practitioner

## 2023-01-08 ENCOUNTER — Encounter: Payer: Self-pay | Admitting: Nurse Practitioner

## 2023-01-08 VITALS — BP 96/56 | HR 78 | Temp 98.0°F | Ht 61.0 in | Wt 139.0 lb

## 2023-01-08 DIAGNOSIS — C679 Malignant neoplasm of bladder, unspecified: Secondary | ICD-10-CM | POA: Diagnosis not present

## 2023-01-08 DIAGNOSIS — J449 Chronic obstructive pulmonary disease, unspecified: Secondary | ICD-10-CM

## 2023-01-08 DIAGNOSIS — F17210 Nicotine dependence, cigarettes, uncomplicated: Secondary | ICD-10-CM | POA: Diagnosis not present

## 2023-01-08 DIAGNOSIS — J418 Mixed simple and mucopurulent chronic bronchitis: Secondary | ICD-10-CM

## 2023-01-08 MED ORDER — BREZTRI AEROSPHERE 160-9-4.8 MCG/ACT IN AERO
2.0000 | INHALATION_SPRAY | Freq: Two times a day (BID) | RESPIRATORY_TRACT | Status: DC
Start: 2023-01-08 — End: 2023-02-25

## 2023-01-08 NOTE — Progress Notes (Unsigned)
@Patient  ID: Brandi Calderon, female    DOB: 1965-08-29, 57 y.o.   MRN: [id number]  Chief Complaint  Patient presents with   Follow-up    Cough, shortness of breath and wheezing. Tolerating inhalers.     Referring provider: Lorre Munroe, NP  HPI: 57 year old female, active smoker followed for COPD.  TEST/EVENTS:   01/08/2023: Today-follow-up Patient presents today for follow-up.  Since she was here last, she had been treated for bladder cancer.  Unfortunately, after she concluded treatment her cancer has returned.  She had a transurethral resection of bladder tumor last week.  Still having some discomfort because of this but slowly improving.  Pathology did come back with cancer cells.  She is being referred back to oncology again.  She is understandably frustrated by this. From a breathing standpoint, she has been doing about the same.  She does get short winded and has a daily cough with clear phlegm.  She does notice some occasional wheezing.  She is currently using Trelegy.  Uses her albuterol a few times a day.  She denies any increased chest congestion, fevers, chills, night sweats, hemoptysis, anorexia, weight loss, lower extremity swelling.  She is still smoking.  She has a lot of life stressors.  Her son died in 2015/03/21 after being fatally shot.  After this, she was up to 4 packs a day.  She has been able to cut back and she is down to 1 pack/day now.  She is just dealing with her cancer diagnosis as well as the release of the man who murdered her son so does not feel like she can quit smoking at this point in time.  Allergies  Allergen Reactions   Penicillins Anaphylaxis    Has patient had a PCN reaction causing immediate rash, facial/tongue/throat swelling, SOB or lightheadedness with hypotension: Yes Has patient had a PCN reaction causing severe rash involving mucus membranes or skin necrosis: No Has patient had a PCN reaction that required hospitalization: No Has patient had  a PCN reaction occurring within the last 10 years: No If all of the above answers are "NO", then may proceed with Cephalosporin use.   Throat swells   Victoza [Liraglutide]     Severe nausea   Vicodin [Hydrocodone-Acetaminophen] Rash    And hives    Immunization History  Administered Date(s) Administered   Influenza Inj Mdck Quad Pf 02/07/2016   Influenza,inj,Quad PF,6+ Mos 01/04/2019, 04/19/2021, 11/27/2021   Influenza-Unspecified 2017-03-20   PFIZER Comirnaty(Gray Top)Covid-19 Tri-Sucrose Vaccine 11/10/2019   Pneumococcal Polysaccharide-23 03/14/2022    Past Medical History:  Diagnosis Date   Anxiety    Aortic atherosclerosis (HCC)    Arthritis    Asthma    Back pain    Bladder cancer (HCC)    Bladder tumor    COPD (chronic obstructive pulmonary disease) (HCC)    Depression    GERD (gastroesophageal reflux disease)    Headache    History of COVID-19    Hyperlipidemia    Hypertension    Neuromuscular disorder (HCC)    Sleep apnea    Tendonitis    Type 2 diabetes mellitus without complication (HCC)    Urinary tract infection due to extended-spectrum beta lactamase (ESBL) producing Escherichia coli 05/22/2022    Tobacco History: Social History   Tobacco Use  Smoking Status Every Day   Current packs/day: 1.00   Average packs/day: 1 pack/day for 39.0 years (39.0 ttl pk-yrs)   Types: Cigarettes   Passive exposure: Current  Smokeless Tobacco Never  Tobacco Comments   1PPD 06/10/2022   Ready to quit: Not Answered Counseling given: Not Answered Tobacco comments: 1PPD 06/10/2022   Outpatient Medications Prior to Visit  Medication Sig Dispense Refill   albuterol (VENTOLIN HFA) 108 (90 Base) MCG/ACT inhaler INHALE 1-2 PUFFS BY MOUTH EVERY 6 HOURS AS NEEDED FOR WHEEZE OR SHORTNESS OF BREATH (Patient taking differently: 1-2 puffs every 6 (six) hours as needed.) 8 each 2   aspirin EC 81 MG tablet Take 1 tablet (81 mg total) by mouth daily. Swallow whole. 30 tablet 12    Aspirin-Salicylamide-Caffeine (BC HEADACHE POWDER PO) Take 4 packets by mouth daily as needed (pain).     atorvastatin (LIPITOR) 80 MG tablet Take 80 mg by mouth at bedtime.     Blood Glucose Monitoring Suppl (ONETOUCH VERIO FLEX SYSTEM) w/Device KIT Use to check blood sugar daily.  DX E11.9 1 kit 1   clonazePAM (KLONOPIN) 0.5 MG tablet TAKE 1 TABLET BY MOUTH EVERY DAY AS NEEDED FOR ANXIETY 30 tablet 0   DULoxetine (CYMBALTA) 30 MG capsule Take 30 mg by mouth at bedtime.     fluticasone (FLONASE) 50 MCG/ACT nasal spray SPRAY 2 SPRAYS INTO EACH NOSTRIL EVERY DAY 48 mL 0   furosemide (LASIX) 40 MG tablet TAKE 1 TABLET BY MOUTH EVERY DAY 90 tablet 0   gabapentin (NEURONTIN) 300 MG capsule Take 300 mg by mouth at bedtime.     glipiZIDE (GLUCOTROL XL) 5 MG 24 hr tablet Take 5 mg by mouth daily with breakfast.     glucose blood test strip Use as instructed 100 each 0   JARDIANCE 25 MG TABS tablet Take 25 mg by mouth every morning.     Lancets (ONETOUCH ULTRASOFT) lancets Use as instructed 100 each 12   lisinopril (ZESTRIL) 5 MG tablet Take 5 mg by mouth every morning.     metFORMIN (GLUCOPHAGE) 1000 MG tablet TAKE 1 TABLET (1,000 MG TOTAL) BY MOUTH 2 (TWO) TIMES DAILY WITH A MEAL. 60 tablet 0   ondansetron (ZOFRAN) 4 MG tablet Take 4 mg by mouth every 8 (eight) hours as needed for nausea.     oxybutynin (DITROPAN XL) 15 MG 24 hr tablet Take 1 tablet (15 mg total) by mouth daily. 90 tablet 3   OXYGEN Inhale 1 L into the lungs at bedtime.     pantoprazole (PROTONIX) 40 MG tablet TAKE 1 TABLET (40 MG TOTAL) BY MOUTH DAILY AS NEEDED (HEARTBURN). 90 tablet 0   polyethylene glycol (MIRALAX) 17 g packet Take 17 g by mouth daily. (Patient taking differently: Take 17 g by mouth daily as needed.) 14 each 0   Semaglutide, 2 MG/DOSE, 8 MG/3ML SOPN Inject 0.75 mLs into the skin once a week. Monday     TRELEGY ELLIPTA 100-62.5-25 MCG/ACT AEPB INHALE 1 PUFF BY MOUTH EVERY DAY 60 each 2   No facility-administered  medications prior to visit.     Review of Systems:   Constitutional: No weight loss or gain, night sweats, fevers, chills, fatigue, or lassitude. HEENT: No headaches, difficulty swallowing, tooth/dental problems, or sore throat. No sneezing, itching, ear ache, nasal congestion, or post nasal drip CV:  No chest pain, orthopnea, PND, swelling in lower extremities, anasarca, dizziness, palpitations, syncope Resp: No shortness of breath with exertion or at rest. No excess mucus or change in color of mucus. No productive or non-productive. No hemoptysis. No wheezing.  No chest wall deformity GI:  No heartburn, indigestion, abdominal pain, nausea, vomiting, diarrhea,  change in bowel habits, loss of appetite, bloody stools.  GU: No dysuria, change in color of urine, urgency or frequency.  No flank pain, no hematuria  Skin: No rash, lesions, ulcerations MSK:  No joint pain or swelling.  No decreased range of motion.  No back pain. Neuro: No dizziness or lightheadedness.  Psych: No depression or anxiety. Mood stable.     Physical Exam:  BP (!) 96/56 (BP Location: Left Arm, Patient Position: Sitting, Cuff Size: Normal)   Pulse 78   Temp 98 F (36.7 C) (Temporal)   Ht 5\' 1"  (1.549 m)   Wt 139 lb (63 kg)   LMP 12/01/2017   SpO2 98%   BMI 26.26 kg/m   GEN: Pleasant, interactive, well-nourished/chronically-ill appearing/acutely-ill appearing/poorly-nourished/morbidly obese; in no acute distress.****** HEENT:  Normocephalic and atraumatic. EACs patent bilaterally. TM pearly gray with present light reflex bilaterally. PERRLA. Sclera white. Nasal turbinates pink, moist and patent bilaterally. No rhinorrhea present. Oropharynx pink and moist, without exudate or edema. No lesions, ulcerations, or postnasal drip.  NECK:  Supple w/ fair ROM. No JVD present. Normal carotid impulses w/o bruits. Thyroid symmetrical with no goiter or nodules palpated. No lymphadenopathy.   CV: RRR, no m/r/g, no peripheral  edema. Pulses intact, +2 bilaterally. No cyanosis, pallor or clubbing. PULMONARY:  Unlabored, regular breathing. Clear bilaterally A&P w/o wheezes/rales/rhonchi. No accessory muscle use.  GI: BS present and normoactive. Soft, non-tender to palpation. No organomegaly or masses detected. No CVA tenderness. MSK: No erythema, warmth or tenderness. Cap refil <2 sec all extrem. No deformities or joint swelling noted.  Neuro: A/Ox3. No focal deficits noted.   Skin: Warm, no lesions or rashe Psych: Normal affect and behavior. Judgement and thought content appropriate.     Lab Results:  CBC    Component Value Date/Time   WBC 9.2 12/31/2022 1433   RBC 3.98 12/31/2022 1433   HGB 11.9 (L) 12/31/2022 1433   HGB 12.8 06/25/2017 1027   HCT 36.0 12/31/2022 1433   HCT 38.3 06/25/2017 1027   PLT 292 12/31/2022 1433   PLT 348 06/25/2017 1027   MCV 90.5 12/31/2022 1433   MCV 90 06/25/2017 1027   MCV 80 12/07/2012 1809   MCH 29.9 12/31/2022 1433   MCHC 33.1 12/31/2022 1433   RDW 15.1 12/31/2022 1433   RDW 15.0 06/25/2017 1027   RDW 17.8 (H) 12/07/2012 1809   LYMPHSABS 1.3 06/12/2022 0516   LYMPHSABS 3.3 (H) 09/11/2016 1131   MONOABS 0.8 06/12/2022 0516   EOSABS 0.2 06/12/2022 0516   EOSABS 0.2 09/11/2016 1131   BASOSABS 0.0 06/12/2022 0516   BASOSABS 0.0 09/11/2016 1131    BMET    Component Value Date/Time   NA 140 12/31/2022 1433   NA 139 06/25/2017 1027   NA 134 (L) 12/07/2012 1809   K 3.8 12/31/2022 1433   K 3.4 (L) 12/07/2012 1809   CL 102 12/31/2022 1433   CL 103 12/07/2012 1809   CO2 29 12/31/2022 1433   CO2 25 12/07/2012 1809   GLUCOSE 95 12/31/2022 1433   GLUCOSE 274 (H) 12/07/2012 1809   BUN 17 12/31/2022 1433   BUN 10 06/25/2017 1027   BUN 10 12/07/2012 1809   CREATININE 1.00 12/31/2022 1433   CREATININE 0.71 03/14/2022 1336   CALCIUM 9.4 12/31/2022 1433   CALCIUM 8.6 12/07/2012 1809   GFRNONAA >60 12/31/2022 1433   GFRNONAA 84 09/07/2019 1019   GFRAA 97 09/07/2019  1019    BNP No results found for: "  BNP"   Imaging:  No results found.  Administration History     None           No data to display          No results found for: "NITRICOXIDE"      Assessment & Plan:   No problem-specific Assessment & Plan notes found for this encounter.   I spent *** minutes of dedicated to the care of this patient on the date of this encounter to include pre-visit review of records, face-to-face time with the patient discussing conditions above, post visit ordering of testing, clinical documentation with the electronic health record, making appropriate referrals as documented, and communicating necessary findings to members of the patients care team.  Noemi Chapel, NP 01/08/2023  Pt aware and understands NP's role.

## 2023-01-08 NOTE — Patient Instructions (Addendum)
Continue Albuterol inhaler 2 puffs every 6 hours as needed for shortness of breath or wheezing. Notify if symptoms persist despite rescue inhaler/neb use.  Continue flonase nasal spray 2 sprays each nostril daily  Continue supplemental oxygen 1 lpm   Stop Trelegy. Trial Breztri 2 puffs Twice daily. Brush tongue and rinse mouth afterwards. If you don't notice a change, you can go back to your Trelegy. If you do have improvement, call me for a new prescription   Follow up in 6 weeks with Dr. Belia Heman or Philis Nettle. If symptoms do not improve or worsen, please contact office for sooner follow up or seek emergency care.

## 2023-01-09 ENCOUNTER — Encounter: Payer: Self-pay | Admitting: Nurse Practitioner

## 2023-01-09 NOTE — Assessment & Plan Note (Signed)
Severe COPD with chronic bronchitis.  She continues to smoke which we discussed is contributing to her symptoms.  She does receive benefit from Trelegy but feels like it wears off.  Will trial her on Breztri to see if she receives benefit from twice daily dosing.  Provided with samples today.  She will call for new Rx if this helps.  Not in acute exacerbation today.  Action plan in place.  Encouraged to work on graded exercises.  Patient Instructions  Continue Albuterol inhaler 2 puffs every 6 hours as needed for shortness of breath or wheezing. Notify if symptoms persist despite rescue inhaler/neb use.  Continue flonase nasal spray 2 sprays each nostril daily  Continue supplemental oxygen 1 lpm   Stop Trelegy. Trial Breztri 2 puffs Twice daily. Brush tongue and rinse mouth afterwards. If you don't notice a change, you can go back to your Trelegy. If you do have improvement, call me for a new prescription   Follow up in 6 weeks with Dr. Belia Heman or Philis Nettle. If symptoms do not improve or worsen, please contact office for sooner follow up or seek emergency care.

## 2023-01-09 NOTE — Assessment & Plan Note (Addendum)
Active smoker.  She is trying to actively cut back.  She was up to 4 packs a day at 1 point.  She has significant life stressors and no desire to quit at this point.  Understands risks of continued smoking.  Smoking cessation advised.

## 2023-01-09 NOTE — Assessment & Plan Note (Signed)
Followed by urology.  Waiting to see oncology to discuss treatment options.

## 2023-01-17 ENCOUNTER — Other Ambulatory Visit: Payer: Self-pay | Admitting: Internal Medicine

## 2023-01-17 DIAGNOSIS — R0602 Shortness of breath: Secondary | ICD-10-CM

## 2023-01-17 DIAGNOSIS — U071 COVID-19: Secondary | ICD-10-CM

## 2023-01-17 NOTE — Telephone Encounter (Signed)
Requested Prescriptions  Pending Prescriptions Disp Refills   albuterol (VENTOLIN HFA) 108 (90 Base) MCG/ACT inhaler [Pharmacy Med Name: ALBUTEROL HFA (PROAIR) INHALER] 8.5 each 2    Sig: INHALE 1-2 PUFFS BY MOUTH EVERY 6 HOURS AS NEEDED FOR WHEEZE OR SHORTNESS OF BREATH     Pulmonology:  Beta Agonists 2 Passed - 01/17/2023  5:51 PM      Passed - Last BP in normal range    BP Readings from Last 1 Encounters:  01/08/23 (!) 96/56         Passed - Last Heart Rate in normal range    Pulse Readings from Last 1 Encounters:  01/08/23 78         Passed - Valid encounter within last 12 months    Recent Outpatient Visits           2 months ago Hyperlipidemia associated with type 2 diabetes mellitus (HCC)   [place] [facility] D[facility], Gentry Fitz A, RPH-CPP   4 months ago Aortic atherosclerosis [facility])   [place] [facility] [place], [place] W, NP   5 months ago Type 2 diabetes mellitus with hyperglycemia, without long-term current use of insulin [facility])   [place] [facility] Delles, Gentry Fitz A, RPH-CPP   6 months ago Type 2 diabetes mellitus with hyperglycemia, without long-term current use of insulin [facility])   [place] [facility] Delles, Gentry Fitz A, RPH-CPP   8 months ago Abnormal uterine bleeding   [place] [facility] [place], Salvadore Oxford, NP       Future Appointments     Next 5 Appointments             In 1 month [place], Elana Al[facility] [place]   In 1 month McGowan, Elana Alm [facility] [place]   In 1 month McGowan, Elana [place] [place]   In 1 month McGowan, Elana Alm [facility] [place]   In 2 months [place], Salvadore Oxford, NP  [facility]         Displaying the next 5 appointments. This patient has additional appointments scheduled.

## 2023-01-23 DIAGNOSIS — J449 Chronic obstructive pulmonary disease, unspecified: Secondary | ICD-10-CM | POA: Diagnosis not present

## 2023-01-27 ENCOUNTER — Other Ambulatory Visit: Payer: Medicare HMO

## 2023-02-07 ENCOUNTER — Ambulatory Visit (INDEPENDENT_AMBULATORY_CARE_PROVIDER_SITE_OTHER): Payer: Medicare HMO

## 2023-02-07 DIAGNOSIS — Z0000 Encounter for general adult medical examination without abnormal findings: Secondary | ICD-10-CM

## 2023-02-07 DIAGNOSIS — Z1211 Encounter for screening for malignant neoplasm of colon: Secondary | ICD-10-CM

## 2023-02-07 NOTE — Patient Instructions (Addendum)
Brandi Calderon , Thank you for taking time to come for your Medicare Wellness Visit. I appreciate your ongoing commitment to your health goals. Please review the following plan we discussed and let me know if I can assist you in the future.   Referrals/Orders/Follow-Ups/Clinician Recommendations: none- referral for colonoscopy  This is a list of the screening recommended for you and due dates:  Health Maintenance  Topic Date Due   Mammogram  Never done   DTaP/Tdap/Td vaccine (1 - Tdap) Never done   Zoster (Shingles) Vaccine (1 of 2) Never done   COVID-19 Vaccine (2 - Pfizer risk series) 12/01/2019   Colon Cancer Screening  05/06/2021   Yearly kidney health urinalysis for diabetes  12/07/2022   Hemoglobin A1C  02/01/2023   Flu Shot  06/30/2023*   Complete foot exam   03/15/2023   Screening for Lung Cancer  05/21/2023   Yearly kidney function blood test for diabetes  12/31/2023   Eye exam for diabetics  12/31/2023   Medicare Annual Wellness Visit  02/07/2024   Pap with HPV screening  09/20/2024   Hepatitis C Screening  Completed   HIV Screening  Completed   HPV Vaccine  Aged Out  *Topic was postponed. The date shown is not the original due date.    Advanced directives: (ACP Link)Information on Advanced Care Planning can be found at [facility] Directives Advance Health Care Directives ([email or web address]   Next Medicare Annual Wellness Visit scheduled for next year: Yes    02/13/24 @ 11:30 am by video

## 2023-02-07 NOTE — Progress Notes (Signed)
Subjective:   Brandi Calderon is a 57 y.o. female who presents for Medicare Annual (Subsequent) preventive examination.  Visit Complete: Virtual I connected with  Aspen Shewmake Radcliffe on 02/07/23 by a audio enabled telemedicine application and verified that I am speaking with the correct person using two identifiers.  Patient Location: Home  Provider Location: Office/Clinic  I discussed the limitations of evaluation and management by telemedicine. The patient expressed understanding and agreed to proceed.  Vital Signs: Because this visit was a virtual/telehealth visit, some criteria may be missing or patient reported. Any vitals not documented were not able to be obtained and vitals that have been documented are patient reported.  Cardiac Risk Factors include: advanced age (>68men, >21 women);dyslipidemia;smoking/ tobacco exposure     Objective:    Today's Vitals   02/07/23 1124  PainSc: 4    There is no height or weight on file to calculate BMI.     02/07/2023   11:32 AM 01/03/2023    8:13 AM 12/31/2022   12:02 PM 07/26/2022    9:02 AM 06/12/2022    4:30 AM 06/07/2022    8:22 AM 06/03/2022    1:41 PM  Advanced Directives  Does Patient Have a Medical Advance Directive? No No No No No No No  Would patient like information on creating a medical advance directive? No - Patient declined No - Patient declined  No - Patient declined No - Patient declined No - Patient declined No - Patient declined    Current Medications (verified) Outpatient Encounter Medications as of 02/07/2023  Medication Sig   albuterol (VENTOLIN HFA) 108 (90 Base) MCG/ACT inhaler INHALE 1-2 PUFFS BY MOUTH EVERY 6 HOURS AS NEEDED FOR WHEEZE OR SHORTNESS OF BREATH   aspirin EC 81 MG tablet Take 1 tablet (81 mg total) by mouth daily. Swallow whole.   atorvastatin (LIPITOR) 80 MG tablet Take 80 mg by mouth at bedtime.   Blood Glucose Monitoring Suppl (ONETOUCH VERIO FLEX SYSTEM) w/Device KIT Use to check blood sugar  daily.  DX E11.9   clonazePAM (KLONOPIN) 0.5 MG tablet TAKE 1 TABLET BY MOUTH EVERY DAY AS NEEDED FOR ANXIETY   DULoxetine (CYMBALTA) 30 MG capsule Take 30 mg by mouth at bedtime.   fluticasone (FLONASE) 50 MCG/ACT nasal spray SPRAY 2 SPRAYS INTO EACH NOSTRIL EVERY DAY   furosemide (LASIX) 40 MG tablet TAKE 1 TABLET BY MOUTH EVERY DAY   gabapentin (NEURONTIN) 300 MG capsule Take 300 mg by mouth at bedtime.   glipiZIDE (GLUCOTROL XL) 5 MG 24 hr tablet Take 5 mg by mouth daily with breakfast.   glucose blood test strip Use as instructed   JARDIANCE 25 MG TABS tablet Take 25 mg by mouth every morning.   Lancets (ONETOUCH ULTRASOFT) lancets Use as instructed   lisinopril (ZESTRIL) 5 MG tablet Take 5 mg by mouth every morning.   metFORMIN (GLUCOPHAGE) 1000 MG tablet TAKE 1 TABLET (1,000 MG TOTAL) BY MOUTH 2 (TWO) TIMES DAILY WITH A MEAL.   ondansetron (ZOFRAN) 4 MG tablet Take 4 mg by mouth every 8 (eight) hours as needed for nausea.   oxybutynin (DITROPAN XL) 15 MG 24 hr tablet Take 1 tablet (15 mg total) by mouth daily.   OXYGEN Inhale 1 L into the lungs at bedtime.   pantoprazole (PROTONIX) 40 MG tablet TAKE 1 TABLET (40 MG TOTAL) BY MOUTH DAILY AS NEEDED (HEARTBURN).   polyethylene glycol (MIRALAX) 17 g packet Take 17 g by mouth daily. (Patient taking differently:  Take 17 g by mouth daily as needed.)   Semaglutide, 2 MG/DOSE, 8 MG/3ML SOPN Inject 0.75 mLs into the skin once a week. Monday   TRELEGY ELLIPTA 100-62.5-25 MCG/ACT AEPB INHALE 1 PUFF BY MOUTH EVERY DAY   Aspirin-Salicylamide-Caffeine (BC HEADACHE POWDER PO) Take 4 packets by mouth daily as needed (pain). (Patient not taking: Reported on 02/07/2023)   Budeson-Glycopyrrol-Formoterol (BREZTRI AEROSPHERE) 160-9-4.8 MCG/ACT AERO Inhale 2 puffs into the lungs in the morning and at bedtime. (Patient not taking: Reported on 02/07/2023)   No facility-administered encounter medications on file as of 02/07/2023.    Allergies  (verified) Penicillins, Victoza [liraglutide], and Vicodin [hydrocodone-acetaminophen]   History: Past Medical History:  Diagnosis Date   Anxiety    Aortic atherosclerosis (HCC)    Arthritis    Asthma    Back pain    Bladder cancer (HCC)    Bladder tumor    COPD (chronic obstructive pulmonary disease) (HCC)    Depression    GERD (gastroesophageal reflux disease)    Headache    History of COVID-19    Hyperlipidemia    Hypertension    Neuromuscular disorder (HCC)    Sleep apnea    Tendonitis    Type 2 diabetes mellitus without complication (HCC)    Urinary tract infection due to extended-spectrum beta lactamase (ESBL) producing Escherichia coli 05/22/2022   Past Surgical History:  Procedure Laterality Date   BLADDER INSTILLATION N/A 01/03/2023   Procedure: BLADDER INSTILLATION OF GEMCITABINE;  Surgeon: [name], MD;  Location: [facility] ORS;  Service: Urology;  Laterality: N/A;   CESAREAN SECTION     3 times   COLONOSCOPY     cyst on neck     as a baby/right side   CYSTOSCOPY WITH BIOPSY N/A 07/26/2022   Procedure: CYSTOSCOPY WITH BLADDER BIOPSY;  Surgeon: [name], MD;  Location: [facility] ORS;  Service: Urology;  Laterality: N/A;   TRANSURETHRAL RESECTION OF BLADDER TUMOR N/A 06/07/2022   Procedure: TRANSURETHRAL RESECTION OF BLADDER TUMOR (TURBT);  Surgeon: [name], MD;  Location: [facility] ORS;  Service: Urology;  Laterality: N/A;   TRANSURETHRAL RESECTION OF BLADDER TUMOR N/A 01/03/2023   Procedure: TRANSURETHRAL RESECTION OF BLADDER TUMOR (TURBT);  Surgeon: [name], MD;  Location: [facility] OR[facility];  Service: Urology;  Laterality: N/A;   Family History  Problem Relation Age of Onset   Diabetes Mother    Kidney disease Father    Diabetes Father    Dementia Father    Diabetes Sister    Diabetes Brother    Diabetes Brother    Colon cancer Neg Hx    Esophageal cancer Neg Hx    Stomach cancer Neg Hx    Rectal cancer Neg Hx    Social History    Socioeconomic History   Marital status: Divorced    Spouse name: Not on file   Number of children: 3   Years of education: Not on file   Highest education level: Not on file  Occupational History   Not on file  Tobacco Use   Smoking status: Every Day    Current packs/day: 1.00    Average packs/day: 1 pack/day for 39.0 years (39.0 ttl pk-yrs)    Types: Cigarettes    Passive exposure: Current   Smokeless tobacco: Never   Tobacco comments:    1PPD 06/10/2022  Vaping Use   Vaping status: Never Used  Substance and Sexual Activity   Alcohol use: No   Drug use: Yes  Types: Marijuana    Comment: States she smokes MJ when she does not have any Tramadol.   Sexual activity: Not on file  Other Topics Concern   Not on file  Social History Narrative   ** Merged History Encounter ** In the process of getting SSD for depression, DM and neuropathy      Active smoker [~ 2p/day; cutting down]; no alcohol; lives in [place]; self. Used to work in [employer].    Social Determinants of Health   Financial Resource Strain: Low Risk  (02/07/2023)   Overall Financial Resource Strain (CARDIA)    Difficulty of Paying Living Expenses: Not very hard  Food Insecurity: No Food Insecurity (02/07/2023)   Hunger Vital Sign    Worried About Running Out of Food in the Last Year: Never true    Ran Out of Food in the Last Year: Never true  Transportation Needs: No Transportation Needs (02/07/2023)   PRAPARE - Administrator, Civil Service (Medical): No    Lack of Transportation (Non-Medical): No  Physical Activity: Insufficiently Active (02/07/2023)   Exercise Vital Sign    Days of Exercise per Week: 3 days    Minutes of Exercise per Session: 30 min  Stress: Stress Concern Present (02/07/2023)   [employer] of Occupational Health - Occupational Stress Questionnaire    Feeling of Stress : To some extent  Social Connections: Socially Isolated (02/07/2023)   Social Connection and Isolation  Panel [NHANES]    Frequency of Communication with Friends and Family: More than three times a week    Frequency of Social Gatherings with Friends and Family: Once a week    Attends Religious Services: Never    Database administrator or Organizations: No    Attends Banker Meetings: Never    Marital Status: Divorced    Tobacco Counseling Ready to quit: Not Answered Counseling given: Not Answered Tobacco comments: 1PPD 06/10/2022   Clinical Intake:  Pre-visit preparation completed: Yes  Pain : 0-10 Pain Score: 4  Pain Type: Chronic pain Pain Location: Back Pain Orientation: Lower Pain Descriptors / Indicators: Aching, Pressure Pain Onset: More than a month ago Pain Frequency: Constant     BMI - recorded: 26.3 Nutritional Status: BMI 25 -29 Overweight Nutritional Risks: None Diabetes: Yes CBG done?: No Did pt. bring in CBG monitor from home?: No  How often do you need to have someone help you when you read instructions, pamphlets, or other written materials from your doctor or pharmacy?: 1 - Never  Interpreter Needed?: No  Information entered by :: Kennedy Bucker, LPN   Activities of Daily Living    02/07/2023   11:33 AM 12/31/2022   12:19 PM  In your present state of health, do you have any difficulty performing the following activities:  Hearing? 0   Vision? 1   Difficulty concentrating or making decisions? 0   Walking or climbing stairs? 1   Dressing or bathing? 0   Doing errands, shopping? 0 0  Preparing Food and eating ? N   Using the Toilet? N   In the past six months, have you accidently leaked urine? N   Do you have problems with loss of bowel control? N   Managing your Medications? N   Managing your Finances? N   Housekeeping or managing your Housekeeping? N     Patient Care Team: Lorre Munroe, NP as PCP - General (Internal Medicine) Dr. Elesa Massed (Psychiatry) Dr. Tiburcio Pea (Gynecology) Virl Axe,  MD (Internal Medicine) Ronney Asters,  Jackelyn Poling, RPH-CPP as Pharmacist (Pharmacist) Bridgett Larsson, LCSW as Social Worker (Licensed Clinical Social Worker) Kemper Durie, RN as [facility] Therapist, music  Indicate any recent Medical Services you may have received from other than [facility] providers in the past year (date may be approximate).     Assessment:   This is a routine wellness examination for Brandi Calderon.  Hearing/Vision screen Hearing Screening - Comments:: No aids Vision Screening - Comments:: Wears glasses- [facility]   Goals Addressed             This Visit's Progress    DIET - INCREASE WATER INTAKE         Depression Screen    02/07/2023   11:29 AM 09/16/2022    3:31 PM 03/14/2022    1:34 PM 02/01/2022   11:51 AM 07/17/2021    3:03 PM 04/19/2021   11:22 AM 01/15/2021    2:27 PM  PHQ 2/9 Scores  PHQ - 2 Score 2 2 1  0 2 0 2  PHQ- 9 Score 4 6 6  0 12 3 15     Fall Risk    02/07/2023   11:33 AM 09/16/2022    3:31 PM 03/14/2022    1:35 PM 02/01/2022   11:54 AM 07/17/2021    3:03 PM  Fall Risk   Falls in the past year? 0 0 1 0 0  Number falls in past yr: 0  0 0 0  Injury with Fall? 0 0 1 0 0  Risk for fall due to : No Fall Risks No Fall Risks  No Fall Risks No Fall Risks  Follow up Falls prevention discussed;Falls evaluation completed   Falls prevention discussed;Falls evaluation completed Falls evaluation completed    MEDICARE RISK AT HOME: Medicare Risk at Home Any stairs in or around the home?: Yes If so, are there any without handrails?: No Home free of loose throw rugs in walkways, pet beds, electrical cords, etc?: Yes Adequate lighting in your home to reduce risk of falls?: Yes Life alert?: No Use of a cane, walker or w/c?: No Grab bars in the bathroom?: No Shower chair or bench in shower?: Yes Elevated toilet seat or a handicapped toilet?: No  TIMED UP AND GO:  Was the test performed?  No    Cognitive Function:        02/07/2023   11:41 AM 02/01/2022   11:56 AM  09/07/2019    9:16 AM  6CIT Screen  What Year? 0 points 0 points 0 points  What month? 0 points 0 points 0 points  What time? 0 points 0 points 0 points  Count back from 20 2 points 0 points 0 points  Months in reverse 0 points 0 points 4 points  Repeat phrase 2 points 0 points 4 points  Total Score 4 points 0 points 8 points    Immunizations Immunization History  Administered Date(s) Administered   Influenza Inj Mdck Quad Pf 02/07/2016   Influenza,inj,Quad PF,6+ Mos 01/04/2019, 04/19/2021, 11/27/2021   Influenza-Unspecified 02/18/2017   PFIZER Comirnaty(Gray Top)Covid-19 Tri-Sucrose Vaccine 11/10/2019   Pneumococcal Polysaccharide-23 03/14/2022    TDAP status: Due, Education has been provided regarding the importance of this vaccine. Advised may receive this vaccine at local pharmacy or Health Dept. Aware to provide a copy of the vaccination record if obtained from local pharmacy or Health Dept. Verbalized acceptance and understanding.  Flu Vaccine status: Declined, Education has been provided regarding the importance of this  vaccine but patient still declined. Advised may receive this vaccine at local pharmacy or Health Dept. Aware to provide a copy of the vaccination record if obtained from local pharmacy or Health Dept. Verbalized acceptance and understanding.  Pneumococcal vaccine status: Up to date  Covid-19 vaccine status: Completed vaccines  Qualifies for Shingles Vaccine? Yes   Zostavax completed No   Shingrix Completed?: No.    Education has been provided regarding the importance of this vaccine. Patient has been advised to call insurance company to determine out of pocket expense if they have not yet received this vaccine. Advised may also receive vaccine at local pharmacy or Health Dept. Verbalized acceptance and understanding.  Screening Tests Health Maintenance  Topic Date Due   MAMMOGRAM  Never done   DTaP/Tdap/Td (1 - Tdap) Never done   Zoster Vaccines- Shingrix  (1 of 2) Never done   COVID-19 Vaccine (2 - Pfizer risk series) 12/01/2019   Colonoscopy  05/06/2021   Diabetic kidney evaluation - Urine ACR  12/07/2022   HEMOGLOBIN A1C  02/01/2023   INFLUENZA VACCINE  06/30/2023 (Originally 10/31/2022)   FOOT EXAM  03/15/2023   Lung Cancer Screening  05/21/2023   Diabetic kidney evaluation - eGFR measurement  12/31/2023   OPHTHALMOLOGY EXAM  12/31/2023   Medicare Annual Wellness (AWV)  02/07/2024   Cervical Cancer Screening (HPV/Pap Cotest)  09/20/2024   Hepatitis C Screening  Completed   HIV Screening  Completed   HPV VACCINES  Aged Out    Health Maintenance  Health Maintenance Due  Topic Date Due   MAMMOGRAM  Never done   DTaP/Tdap/Td (1 - Tdap) Never done   Zoster Vaccines- Shingrix (1 of 2) Never done   COVID-19 Vaccine (2 - Pfizer risk series) 12/01/2019   Colonoscopy  05/06/2021   Diabetic kidney evaluation - Urine ACR  12/07/2022   HEMOGLOBIN A1C  02/01/2023    Colorectal cancer screening: Type of screening: Colonoscopy. Completed 05/06/18. Repeat every 3 years  Mammogram status: Ordered 03/14/22. Pt provided with contact info and advised to call to schedule appt.    Lung Cancer Screening: (Low Dose CT Chest recommended if Age 31-80 years, 20 pack-year currently smoking OR have quit w/in 15years.) does qualify.   Lung Cancer Screening Referral: CT scan done 05/21/22  Additional Screening:  Hepatitis C Screening: does qualify; Completed 03/14/22  Vision Screening: Recommended annual ophthalmology exams for early detection of glaucoma and other disorders of the eye. Is the patient up to date with their annual eye exam?  Yes  Who is the provider or what is the name of the office in which the patient attends annual eye exams? [facility] If pt is not established with a provider, would they like to be referred to a provider to establish care? No .   Dental Screening: Recommended annual dental exams for proper oral hygiene  Diabetic  Foot Exam: Diabetic Foot Exam: Completed 03/14/22  Community Resource Referral / Chronic Care Management: CRR required this visit?  No   CCM required this visit?  No     Plan:     I have personally reviewed and noted the following in the patient's chart:   Medical and social history Use of alcohol, tobacco or illicit drugs  Current medications and supplements including opioid prescriptions. Patient is not currently taking opioid prescriptions. Functional ability and status Nutritional status Physical activity Advanced directives List of other physicians Hospitalizations, surgeries, and ER visits in previous 12 months Vitals Screenings to include cognitive,  depression, and falls Referrals and appointments  In addition, I have reviewed and discussed with patient certain preventive protocols, quality metrics, and best practice recommendations. A written personalized care plan for preventive services as well as general preventive health recommendations were provided to patient.     Hal Hope, LPN   16/04/958   After Visit Summary: (MyChart) Due to this being a telephonic visit, the after visit summary with patients personalized plan was offered to patient via MyChart   Nurse Notes: none

## 2023-02-10 ENCOUNTER — Telehealth: Payer: Medicare HMO

## 2023-02-10 ENCOUNTER — Ambulatory Visit: Payer: Self-pay | Admitting: *Deleted

## 2023-02-10 ENCOUNTER — Telehealth: Payer: Self-pay | Admitting: Pharmacist

## 2023-02-10 NOTE — Progress Notes (Signed)
   Outreach Note  02/10/2023 Name: Brandi Calderon MRN: [id number] DOB: Jun 10, 1965  Referred by: Lorre Munroe, NP  Reach patient by telephone today, but she reports that now is not a good time to talk. Reschedule appointment as requested.  Estelle Grumbles, PharmD, [facility] [phone]

## 2023-02-10 NOTE — Patient Outreach (Signed)
  Care Coordination   02/10/2023 Name: Brandi Calderon MRN: [id number] DOB: 1965-06-17   Care Coordination Outreach Attempts:  An unsuccessful telephone outreach was attempted for a scheduled appointment today.  Follow Up Plan:  Additional outreach attempts will be made to offer the patient care coordination information and services.   Encounter Outcome:  No Answer   Care Coordination Interventions:  No, not indicated    Kemper Durie RN, MSN, CCM [facility], [facility] RN Care Coordinator Direct Dial: [phone] / Main [phone] Fax [phone] Email: [email or web address] Website: [email or web address]

## 2023-02-11 ENCOUNTER — Other Ambulatory Visit: Payer: Self-pay

## 2023-02-11 ENCOUNTER — Emergency Department
Admission: EM | Admit: 2023-02-11 | Discharge: 2023-02-11 | Disposition: A | Discharge status: Home / Self Care | Payer: Medicare HMO | Attending: Emergency Medicine | Admitting: Emergency Medicine

## 2023-02-11 ENCOUNTER — Ambulatory Visit: Payer: Medicare HMO | Admitting: Internal Medicine

## 2023-02-11 DIAGNOSIS — E119 Type 2 diabetes mellitus without complications: Secondary | ICD-10-CM | POA: Diagnosis not present

## 2023-02-11 DIAGNOSIS — R059 Cough, unspecified: Secondary | ICD-10-CM | POA: Diagnosis not present

## 2023-02-11 DIAGNOSIS — B9789 Other viral agents as the cause of diseases classified elsewhere: Secondary | ICD-10-CM | POA: Diagnosis not present

## 2023-02-11 DIAGNOSIS — I2510 Atherosclerotic heart disease of native coronary artery without angina pectoris: Secondary | ICD-10-CM | POA: Insufficient documentation

## 2023-02-11 DIAGNOSIS — J069 Acute upper respiratory infection, unspecified: Secondary | ICD-10-CM | POA: Diagnosis not present

## 2023-02-11 DIAGNOSIS — J45909 Unspecified asthma, uncomplicated: Secondary | ICD-10-CM | POA: Insufficient documentation

## 2023-02-11 DIAGNOSIS — Z1152 Encounter for screening for COVID-19: Secondary | ICD-10-CM | POA: Insufficient documentation

## 2023-02-11 LAB — RESP PANEL BY RT-PCR (RSV, FLU A&B, COVID)  RVPGX2
Influenza A by PCR: NEGATIVE
Influenza B by PCR: NEGATIVE
Resp Syncytial Virus by PCR: NEGATIVE
SARS Coronavirus 2 by RT PCR: NEGATIVE

## 2023-02-11 MED ORDER — BENZONATATE 200 MG PO CAPS: 200.0000 mg | ORAL_CAPSULE | Freq: Three times a day (TID) | ORAL | 0 refills | Status: DC | PRN

## 2023-02-11 NOTE — Discharge Instructions (Addendum)
Call make an appoint with your primary care provider if any continued problems or concerns.  Increase fluids to stay hydrated.  A prescription for cough medication was sent to the pharmacy for you to begin taking every 8 hours as needed for cough.  Tylenol or ibuprofen can be used for body aches or headache.

## 2023-02-11 NOTE — ED Notes (Signed)
See triage note  Presents with body aches  headache and lost of taste   Sxs started 3 days ago

## 2023-02-11 NOTE — ED Provider Notes (Signed)
[facility] Provider Note    Event Date/Time   First MD Initiated Contact with Patient 02/11/23 [phone]     (approximate)   History   Generalized Body Aches   HPI  Brandi Calderon is a 57 y.o. female presents to the ED with complaint of generalized body aches, cough and congestion for the last 3 days.  Patient is a pack-a-day smoker and has history of COPD.  She reports that she sleeps with oxygen at night.  No reported fever or chills.  Patient has history of CAD, diabetes type 2, GERD with esophagitis, LEEP apnea asthma and depression.      Physical Exam   Triage Vital Signs: ED Triage Vitals  Encounter Vitals Group     BP 02/11/23 0858 121/76     Systolic BP Percentile --      Diastolic BP Percentile --      Pulse Rate 02/11/23 0858 89     Resp 02/11/23 0858 18     Temp 02/11/23 0858 98.5 F (36.9 C)     Temp src --      SpO2 02/11/23 0858 100 %     Weight --      Height --      Head Circumference --      Peak Flow --      Pain Score 02/11/23 0855 10     Pain Loc --      Pain Education --      Exclude from Growth Chart --     Most recent vital signs: Vitals:   02/11/23 0858 02/11/23 0900  BP: 121/76 121/76  Pulse: 89 89  Resp: 18 18  Temp: 98.5 F (36.9 C) 98.5 F (36.9 C)  SpO2: 100% 100%     General: Awake, no distress.  Able to talk in complete sentences without any difficulty breathing. CV:  Good peripheral perfusion.  Heart rate and rate rhythm. Resp:  Normal effort.  Clear bilaterally. Abd:  No distention.  Other:     ED Results / Procedures / Treatments   Labs (all labs ordered are listed, but only abnormal results are displayed) Labs Reviewed  RESP PANEL BY RT-PCR (RSV, FLU A&B, COVID)  [id number]     PROCEDURES:  Critical Care performed:   Procedures   MEDICATIONS ORDERED IN ED: Medications - No data to display   IMPRESSION / MDM / ASSESSMENT AND PLAN / ED COURSE  I reviewed the triage vital signs  and the nursing notes.   Differential diagnosis includes, but is not limited to, upper respiratory infection, COVID, influenza, RSV, bronchitis, pneumonia.  57 year old female presents to the ED with 3 days of upper respiratory symptoms with respiratory panel being negative.  Patient is continue with Tylenol, ibuprofen and drinking fluids.  A prescription for Tessalon pearls 200 mg 3 times daily as needed for cough was sent to the pharmacy.  Patient is encouraged to follow-up with her PCP if any continued problems.      Patient's presentation is most consistent with acute complicated illness / injury requiring diagnostic workup.  FINAL CLINICAL IMPRESSION(S) / ED DIAGNOSES   Final diagnoses:  Viral URI with cough     Rx / DC Orders   ED Discharge Orders          Ordered    benzonatate (TESSALON) 200 MG capsule  3 times daily PRN        02/11/23 1041  Note:  This document was prepared using Dragon voice recognition software and may include unintentional dictation errors.   Tommi Rumps, PA-C 02/11/23 1053    Sharman Cheek, MD 02/11/23 [phone]

## 2023-02-11 NOTE — ED Triage Notes (Signed)
Pt comes with c/o 3 days of generalized body aches and pains. Pt states cough and congestion

## 2023-02-14 ENCOUNTER — Other Ambulatory Visit: Payer: Self-pay | Admitting: Internal Medicine

## 2023-02-14 NOTE — Telephone Encounter (Signed)
Requested Prescriptions  Pending Prescriptions Disp Refills   fluticasone (FLONASE) 50 MCG/ACT nasal spray [Pharmacy Med Name: FLUTICASONE PROP 50 MCG SPRAY] 48 mL 0    Sig: SPRAY 2 SPRAYS INTO EACH NOSTRIL EVERY DAY     Ear, Nose, and Throat: Nasal Preparations - Corticosteroids Passed - 02/14/2023  1:28 AM      Passed - Valid encounter within last 12 months    Recent Outpatient Visits           3 months ago Hyperlipidemia associated with type 2 diabetes mellitus [facility])   [place] [facility] Delles, Gentry Fitz A, RPH-CPP   5 months ago Aortic atherosclerosis [facility])   [place] [facility] [place], [place] W, NP   5 months ago Type 2 diabetes mellitus with hyperglycemia, without long-term current use of insulin [facility])   [place] [facility] Delles, Gentry Fitz A, RPH-CPP   7 months ago Type 2 diabetes mellitus with hyperglycemia, without long-term current use of insulin [facility])   [facility] Delles, Gentry Fitz A, RPH-CPP   9 months ago Abnormal uterine bleeding   [place] [facility] [place], Salvadore Oxford, NP       Future Appointments     Next 5 Appointments             In 5 days McGowan, Elana Al[place] [facility] [place]   In 1 week McGowan, Elana Alm [facility] [place]   In 2 weeks McGowan, Elana Alm [facility]rology [place]   In 3 weeks McGowan, Elana Alm [facility]rology [place]   In 1 month [place], Salvadore Oxford, NP  [facility], [facility]         Displaying the next 5 appointments. This patient has additional appointments scheduled.

## 2023-02-17 NOTE — Progress Notes (Unsigned)
BCG Bladder Instillation  BCG # 1/6  Due to Bladder Cancer patient is present today for a BCG treatment. Patient was cleaned and prepped in a sterile fashion with betadine. A 14 FR catheter was inserted, urine return was noted 5 ml, urine was yellow in color.  50ml of reconstituted BCG was instilled into the bladder. The catheter was then removed. Patient tolerated well, no complications were noted  Performed by: Michiel Cowboy, PA-C and Humberta Magallon -Mariche, CMA   Follow up/ Additional notes: 1 week for number 2 out of 6 BCG  UA with 11-30 WBCs and many bacteria but greater than 10 squames.  Likely contaminated.  She denied any UTI symptoms today.

## 2023-02-19 ENCOUNTER — Ambulatory Visit: Payer: Medicare HMO | Admitting: Urology

## 2023-02-19 ENCOUNTER — Encounter: Payer: Self-pay | Admitting: Urology

## 2023-02-19 VITALS — BP 111/69 | HR 87 | Ht 61.0 in | Wt 138.0 lb

## 2023-02-19 DIAGNOSIS — D494 Neoplasm of unspecified behavior of bladder: Secondary | ICD-10-CM

## 2023-02-19 DIAGNOSIS — R310 Gross hematuria: Secondary | ICD-10-CM | POA: Diagnosis not present

## 2023-02-19 DIAGNOSIS — C679 Malignant neoplasm of bladder, unspecified: Secondary | ICD-10-CM | POA: Diagnosis not present

## 2023-02-19 LAB — URINALYSIS, COMPLETE
Bilirubin, UA: NEGATIVE
Glucose, UA: NEGATIVE
Ketones, UA: NEGATIVE
Nitrite, UA: NEGATIVE
Protein,UA: NEGATIVE
Specific Gravity, UA: 1.02 (ref 1.005–1.030)
Urobilinogen, Ur: 0.2 mg/dL (ref 0.2–1.0)
pH, UA: 5.5 (ref 5.0–7.5)

## 2023-02-19 LAB — MICROSCOPIC EXAMINATION: Epithelial Cells (non renal): >10 /[HPF] — AB (ref 0–10)

## 2023-02-19 MED ORDER — BCG LIVE 50 MG IS SUSR
3.2400 mL | Freq: Once | INTRAVESICAL | Status: AC
Start: 2023-02-19 — End: 2023-02-19
  Administered 2023-02-19: 81 mg via INTRAVESICAL

## 2023-02-20 ENCOUNTER — Encounter: Payer: Self-pay | Admitting: *Deleted

## 2023-02-21 ENCOUNTER — Ambulatory Visit: Payer: Self-pay | Admitting: *Deleted

## 2023-02-21 NOTE — Patient Outreach (Signed)
  Care Coordination   Follow Up Visit Note   02/21/2023 Name: Brandi Calderon MRN: [id number] DOB: January 28, 1966  Brandi Calderon is a 57 y.o. year old female who sees Brandi Calderon, Brandi Oxford, NP for primary care. I spoke with  Brandi Calderon by phone today.  What matters to the patients health and wellness today?  Completing cancer treatment without complications.  Sister in law providing support and transportation to appointments.    Goals Addressed             This Visit's Progress    Effective treatment for bladder cancer   On track    Interventions Today    Flowsheet Row Most Recent Value  Chronic Disease   Chronic disease during today's visit Diabetes, Other  [bladder cancer]  General Interventions   General Interventions Discussed/Reviewed General Interventions Reviewed, Labs, Doctor Visits  [seen in ED for URI last week, doing much better, did not need antibiotics]  Labs Hgb A1c every 3 months  [most recent 6.9]  Doctor Visits Discussed/Reviewed Doctor Visits Reviewed, PCP, Specialist  [upcoming with endocrinology 11/25, pulmonary 11/26, pharmacy team 12/2, and PCP 12/18.  She has urology appt every Wednesday for bladder cancer treatment.  She had #1/6 this past Wednesday]  PCP/Specialist Visits Compliance with follow-up visit  Education Interventions   Education Provided Provided Education  Provided Verbal Education On Blood Sugar Monitoring, Medication, When to see the doctor  Brandi Calderon of process for cancer treatment, has nausea meds prescribed. Report blood sugar is better, denies any hypoglycemic episodes]           Management of DM       Care Coordination Interventions: Provided education to patient about basic DM disease process Reviewed medications with patient and discussed importance of medication adherence Counseled on importance of regular laboratory monitoring as prescribed Discussed plans with patient for ongoing care management follow up and provided patient  with direct contact information for care management team Provided patient with written educational materials related to hypo and hyperglycemia and importance of correct treatment         SDOH assessments and interventions completed:  No     Care Coordination Interventions:  Yes, provided   Follow up plan: Follow up call scheduled for 12/30    Encounter Outcome:  Patient Visit Completed   Rodney Langton, RN, MSN, CCM [place]  [facility], [facility] RN Care Coordinator Direct Dial: [phone] / Main [phone] Fax [phone] Email: [email or web address]: [email or web address]

## 2023-02-22 LAB — CULTURE, URINE COMPREHENSIVE

## 2023-02-23 DIAGNOSIS — J449 Chronic obstructive pulmonary disease, unspecified: Secondary | ICD-10-CM | POA: Diagnosis not present

## 2023-02-24 LAB — MICROALBUMIN / CREATININE URINE RATIO: Microalb Creat Ratio: 61.1

## 2023-02-24 LAB — BASIC METABOLIC PANEL: Creatinine: 0.8 (ref 0.5–1.1)

## 2023-02-24 LAB — LIPID PANEL
LDL Cholesterol: 126
Triglycerides: 119 (ref 40–160)

## 2023-02-24 LAB — PROTEIN / CREATININE RATIO, URINE
Albumin, U: 14
Creatinine, Urine: 22.9

## 2023-02-24 LAB — COMPREHENSIVE METABOLIC PANEL: eGFR: 87

## 2023-02-24 NOTE — Progress Notes (Unsigned)
BCG Bladder Instillation  BCG # 2/6  Due to Bladder Cancer patient is present today for a BCG treatment. Patient was cleaned and prepped in a sterile fashion with betadine. A 14 FR catheter was inserted, urine return was noted 0 ml of urine returned.  50ml of reconstituted BCG was instilled into the bladder. The catheter was then removed. Patient tolerated well, no complications were noted  Performed by: Michiel Cowboy, PA-C and Humberta Magallon, Mariche, CMA   Follow up/ Additional notes: Follow-up in 1 week for number 3 out of 6 BCG

## 2023-02-25 ENCOUNTER — Encounter: Payer: Self-pay | Admitting: Nurse Practitioner

## 2023-02-25 ENCOUNTER — Ambulatory Visit: Payer: Medicare HMO | Admitting: Nurse Practitioner

## 2023-02-25 VITALS — BP 110/60 | HR 65 | Temp 97.6°F | Ht 61.0 in | Wt 140.4 lb

## 2023-02-25 DIAGNOSIS — J449 Chronic obstructive pulmonary disease, unspecified: Secondary | ICD-10-CM

## 2023-02-25 DIAGNOSIS — C679 Malignant neoplasm of bladder, unspecified: Secondary | ICD-10-CM

## 2023-02-25 DIAGNOSIS — J9611 Chronic respiratory failure with hypoxia: Secondary | ICD-10-CM | POA: Insufficient documentation

## 2023-02-25 DIAGNOSIS — Z23 Encounter for immunization: Secondary | ICD-10-CM

## 2023-02-25 DIAGNOSIS — F17210 Nicotine dependence, cigarettes, uncomplicated: Secondary | ICD-10-CM | POA: Diagnosis not present

## 2023-02-25 MED ORDER — TRELEGY ELLIPTA 100-62.5-25 MCG/ACT IN AEPB
1.0000 | INHALATION_SPRAY | Freq: Every day | RESPIRATORY_TRACT | 6 refills | Status: DC
Start: 2023-02-25 — End: 2023-10-27

## 2023-02-25 NOTE — Progress Notes (Signed)
@Patient  ID: Brandi Calderon, female    DOB: 09/21/1965, 57 y.o.   MRN: [id number]  Chief Complaint  Patient presents with   Follow-up    Cough, and wheezing. Shortness of breath on exertion and occasional at rest.     Referring provider: Lorre Munroe, NP  HPI: 57 year old female, active smoker followed for COPD.  She is a patient of Dr. Clovis Fredrickson and last seen in office 01/08/2023 by Allison Quarry NP.  Past medical history significant for atherosclerosis/CAD, GERD, HLD, diabetes, neuropathy, anxiety and depression, bladder cancer.  TEST/EVENTS:  10/04/2020 PFT: FVC 68, FEV1 49, ratio 55, TLC 81, DLCO 77.  Severe obstructive airway disease without reversibility 05/20/2022 LDCT lung cancer screening: Atherosclerosis/CAD.  Centrilobular emphysema.  Stable left upper lobe nodule, 4.3 mm.  Lung RADS 2.  06/10/2022: OV with Dr. Aundria Rud.  Confusion in scheduling and put on incorrect schedule.  Symptoms have been stable.  Compliant with inhalers.  Exam benign.  Continue on Trelegy.  Preprocedural restratification.  Consider low risk for postprocedural pulmonary complications/failure.  Smoking cessation again advised.  01/08/2023: OV with Rhylen Pulido NP for follow-up.  Since she was here last, she had been treated for bladder cancer.  Unfortunately, after she concluded treatment her cancer has returned.  She had a transurethral resection of bladder tumor last week.  Still having some discomfort because of this but slowly improving.  Pathology did come back with cancer cells.  She is being referred back to oncology again.  She is understandably frustrated by this. From a breathing standpoint, she has been doing about the same.  She does get short winded and has a daily cough with clear phlegm.  She does notice some occasional wheezing.  She is currently using Trelegy.  Uses her albuterol a few times a day.  She denies any increased chest congestion, fevers, chills, night sweats, hemoptysis, anorexia, weight loss, lower  extremity swelling.  She is still smoking.  She has a lot of life stressors.  Her son died in 03-07-2015 after being fatally shot.  After this, she was up to 4 packs a day.  She has been able to cut back and she is down to 1 pack/day now.  She is just dealing with her cancer diagnosis as well as the release of the man who murdered her son so does not feel like she can quit smoking at this point in time.  02/25/2023: Today - follow up Patient presents today for follow-up. Feeling about the same as our last visit.  She did try the [facility] but felt like this made her cough more and did not really notice any significant change from the Trelegy.  Back to using her Trelegy.   She did have a URI a few weeks ago.  Was seen at the emergency department.  COVID and flu testing were negative.  She was treated with supportive care measures.  Feels like she is back to her baseline since then.  Still has a high symptom burden with shortness of breath with minimal activity.  She does have a daily cough with clear phlegm, which is normal for her.  Has not noticed any increased wheezing.  Denies any fevers, chills, night sweats, mops this, anorexia, weight loss.  Still smoking.  Has no desire to quit at this time.  Not using albuterol much.  She does use oxygen at night.  Trying to get full disability.  She is unable to complete any sort of distance walking without getting short of  breath.  Cannot even do her grocery shopping without having to stop multiple times.  Allergies  Allergen Reactions   Penicillins Anaphylaxis    Has patient had a PCN reaction causing immediate rash, facial/tongue/throat swelling, SOB or lightheadedness with hypotension: Yes Has patient had a PCN reaction causing severe rash involving mucus membranes or skin necrosis: No Has patient had a PCN reaction that required hospitalization: No Has patient had a PCN reaction occurring within the last 10 years: No If all of the above answers are "NO", then may  proceed with Cephalosporin use.   Throat swells   Victoza [Liraglutide]     Severe nausea   Vicodin [Hydrocodone-Acetaminophen] Rash    And hives    Immunization History  Administered Date(s) Administered   Influenza Inj Mdck Quad Pf 02/07/2016   Influenza, Seasonal, Injecte, Preservative Fre 02/25/2023   Influenza,inj,Quad PF,6+ Mos 01/04/2019, 04/19/2021, 11/27/2021   Influenza-Unspecified 02/18/2017   PFIZER Comirnaty(Gray Top)Covid-19 Tri-Sucrose Vaccine 11/10/2019   Pneumococcal Polysaccharide-23 03/14/2022    Past Medical History:  Diagnosis Date   Anxiety    Aortic atherosclerosis (HCC)    Arthritis    Asthma    Back pain    Bladder cancer (HCC)    Bladder tumor    COPD (chronic obstructive pulmonary disease) (HCC)    Depression    GERD (gastroesophageal reflux disease)    Headache    History of COVID-19    Hyperlipidemia    Hypertension    Neuromuscular disorder (HCC)    Sleep apnea    Tendonitis    Type 2 diabetes mellitus without complication (HCC)    Urinary tract infection due to extended-spectrum beta lactamase (ESBL) producing Escherichia coli 05/22/2022    Tobacco History: Social History   Tobacco Use  Smoking Status Every Day   Current packs/day: 1.00   Average packs/day: 1 pack/day for 40.9 years (40.9 ttl pk-yrs)   Types: Cigarettes   Start date: 1984   Passive exposure: Current  Smokeless Tobacco Never  Tobacco Comments   1PPD 06/10/2022   Ready to quit: Not Answered Counseling given: Not Answered Tobacco comments: 1PPD 06/10/2022   Outpatient Medications Prior to Visit  Medication Sig Dispense Refill   albuterol (VENTOLIN HFA) 108 (90 Base) MCG/ACT inhaler INHALE 1-2 PUFFS BY MOUTH EVERY 6 HOURS AS NEEDED FOR WHEEZE OR SHORTNESS OF BREATH 8.5 each 2   aspirin EC 81 MG tablet Take 1 tablet (81 mg total) by mouth daily. Swallow whole. 30 tablet 12   Aspirin-Salicylamide-Caffeine (BC HEADACHE POWDER PO) Take 4 packets by mouth daily as  needed (pain).     atorvastatin (LIPITOR) 80 MG tablet Take 80 mg by mouth at bedtime.     benzonatate (TESSALON) 200 MG capsule Take 1 capsule (200 mg total) by mouth 3 (three) times daily as needed for cough. 30 capsule 0   Blood Glucose Monitoring Suppl (ONETOUCH VERIO FLEX SYSTEM) w/Device KIT Use to check blood sugar daily.  DX E11.9 1 kit 1   clonazePAM (KLONOPIN) 0.5 MG tablet TAKE 1 TABLET BY MOUTH EVERY DAY AS NEEDED FOR ANXIETY 30 tablet 0   DULoxetine (CYMBALTA) 30 MG capsule Take 30 mg by mouth at bedtime.     fluticasone (FLONASE) 50 MCG/ACT nasal spray SPRAY 2 SPRAYS INTO EACH NOSTRIL EVERY DAY 48 mL 0   furosemide (LASIX) 40 MG tablet TAKE 1 TABLET BY MOUTH EVERY DAY 90 tablet 0   gabapentin (NEURONTIN) 300 MG capsule Take 300 mg by mouth at bedtime.  glipiZIDE (GLUCOTROL XL) 5 MG 24 hr tablet Take 5 mg by mouth daily with breakfast.     glucose blood test strip Use as instructed 100 each 0   JARDIANCE 25 MG TABS tablet Take 25 mg by mouth every morning.     Lancets (ONETOUCH ULTRASOFT) lancets Use as instructed 100 each 12   lisinopril (ZESTRIL) 5 MG tablet Take 5 mg by mouth every morning.     metFORMIN (GLUCOPHAGE) 1000 MG tablet TAKE 1 TABLET (1,000 MG TOTAL) BY MOUTH 2 (TWO) TIMES DAILY WITH A MEAL. 60 tablet 0   ondansetron (ZOFRAN) 4 MG tablet Take 4 mg by mouth every 8 (eight) hours as needed for nausea.     oxybutynin (DITROPAN XL) 15 MG 24 hr tablet Take 1 tablet (15 mg total) by mouth daily. 90 tablet 3   OXYGEN Inhale 1 L into the lungs at bedtime.     pantoprazole (PROTONIX) 40 MG tablet TAKE 1 TABLET (40 MG TOTAL) BY MOUTH DAILY AS NEEDED (HEARTBURN). 90 tablet 0   polyethylene glycol (MIRALAX) 17 g packet Take 17 g by mouth daily. (Patient taking differently: Take 17 g by mouth daily as needed.) 14 each 0   Semaglutide, 2 MG/DOSE, 8 MG/3ML SOPN Inject 0.75 mLs into the skin once a week. Monday     TRELEGY ELLIPTA 100-62.5-25 MCG/ACT AEPB INHALE 1 PUFF BY MOUTH  EVERY DAY 60 each 2   Budeson-Glycopyrrol-Formoterol (BREZTRI AEROSPHERE) 160-9-4.8 MCG/ACT AERO Inhale 2 puffs into the lungs in the morning and at bedtime.     No facility-administered medications prior to visit.     Review of Systems:   Constitutional: No weight loss or gain, night sweats, fevers, chills, fatigue, or lassitude. HEENT: No headaches, difficulty swallowing, tooth/dental problems, or sore throat. No sneezing, itching, ear ache, nasal congestion, or post nasal drip CV:  No chest pain, orthopnea, PND, swelling in lower extremities, anasarca, dizziness, palpitations, syncope Resp: +shortness of breath with exertion; daily productive cough; occasional wheeze. No excess mucus or change in color of mucus.  No hemoptysis.  No chest wall deformity GI:  No heartburn, indigestion GU: No dysuria, change in color of urine, urgency or frequency, hematuria. +lower back/abd pain post op Skin: No rash, lesions, ulcerations MSK:  No joint pain or swelling.   Neuro: No dizziness or lightheadedness.  Psych: No depression or anxiety. Mood stable.     Physical Exam:  BP 110/60 (BP Location: Left Arm, Patient Position: Sitting, Cuff Size: Normal)   Pulse 65   Temp 97.6 F (36.4 C) (Temporal)   Ht 5\' 1"  (1.549 m)   Wt 140 lb 6.4 oz (63.7 kg)   LMP 12/01/2017   SpO2 100%   BMI 26.53 kg/m   GEN: Pleasant, interactive, well-appearing; in no acute distress HEENT:  Normocephalic and atraumatic. PERRLA. Sclera white. Nasal turbinates pink, moist and patent bilaterally. No rhinorrhea present. Oropharynx pink and moist, without exudate or edema. No lesions, ulcerations, or postnasal drip.  NECK:  Supple w/ fair ROM. No JVD present. Thyroid symmetrical with no goiter or nodules palpated. No lymphadenopathy.   CV: RRR, no m/r/g, no peripheral edema. Pulses intact, +2 bilaterally. No cyanosis, pallor or clubbing. PULMONARY:  Unlabored, regular breathing. Clear bilaterally A&P w/o  wheezes/rales/rhonchi. No accessory muscle use.  GI: BS present and normoactive. Soft, non-tender to palpation. No organomegaly or masses detected.  MSK: No erythema, warmth or tenderness. Cap refil <2 sec all extrem. No deformities or joint swelling noted.  Neuro:  A/Ox3. No focal deficits noted.   Skin: Warm, no lesions or rashe Psych: Normal affect and behavior. Judgement and thought content appropriate.     Lab Results:  CBC    Component Value Date/Time   WBC 9.2 12/31/2022 1433   RBC 3.98 12/31/2022 1433   HGB 11.9 (L) 12/31/2022 1433   HGB 12.8 06/25/2017 1027   HCT 36.0 12/31/2022 1433   HCT 38.3 06/25/2017 1027   PLT 292 12/31/2022 1433   PLT 348 06/25/2017 1027   MCV 90.5 12/31/2022 1433   MCV 90 06/25/2017 1027   MCV 80 12/07/2012 1809   MCH 29.9 12/31/2022 1433   MCHC 33.1 12/31/2022 1433   RDW 15.1 12/31/2022 1433   RDW 15.0 06/25/2017 1027   RDW 17.8 (H) 12/07/2012 1809   LYMPHSABS 1.3 06/12/2022 0516   LYMPHSABS 3.3 (H) 09/11/2016 1131   MONOABS 0.8 06/12/2022 0516   EOSABS 0.2 06/12/2022 0516   EOSABS 0.2 09/11/2016 1131   BASOSABS 0.0 06/12/2022 0516   BASOSABS 0.0 09/11/2016 1131    BMET    Component Value Date/Time   NA 140 12/31/2022 1433   NA 139 06/25/2017 1027   NA 134 (L) 12/07/2012 1809   K 3.8 12/31/2022 1433   K 3.4 (L) 12/07/2012 1809   CL 102 12/31/2022 1433   CL 103 12/07/2012 1809   CO2 29 12/31/2022 1433   CO2 25 12/07/2012 1809   GLUCOSE 95 12/31/2022 1433   GLUCOSE 274 (H) 12/07/2012 1809   BUN 17 12/31/2022 1433   BUN 10 06/25/2017 1027   BUN 10 12/07/2012 1809   CREATININE 1.00 12/31/2022 1433   CREATININE 0.71 03/14/2022 1336   CALCIUM 9.4 12/31/2022 1433   CALCIUM 8.6 12/07/2012 1809   GFRNONAA >60 12/31/2022 1433   GFRNONAA 84 09/07/2019 1019   GFRAA 97 09/07/2019 1019    BNP No results found for: "BNP"   Imaging:  No results found.  bcg vaccine injection 81 mg     Date Action Dose Route User   02/19/2023  1545 Given 81 mg Bladder Instillation Magallon-Mariche, Humberta, CMA           No data to display          No results found for: "NITRICOXIDE"      Assessment & Plan:   COPD, severe (HCC) Severe COPD with high symptom burden.  No significant response to [employer]. She has resumed Trelegy. Will update her rx. Encouraged her to work on graded exercises. Smoking cessation imperative. No desire to quit at this time. Action plan in place.  She would be very limited in her ability to work at this time between her severe lung disease, chronic respiratory failure, and bladder cancer requiring multiple visits for treatment/follow ups. She is unable to walk over 30 ft without getting short winded and having to stop to rest. She will start the disability process.   Patient Instructions  Continue Albuterol inhaler 2 puffs every 6 hours as needed for shortness of breath or wheezing. Notify if symptoms persist despite rescue inhaler/neb use.  Continue flonase nasal spray 2 sprays each nostril daily  Continue supplemental oxygen 1 lpm for goal >88-90% Continue Trelegy 1 puff daily. Brush tongue and rinse mouth afterwards   Lung cancer screening CT chest February 2025  Flu shot today   You can bring your disability paperwork in and we will start the process for you  Follow up in 4 months with Dr. Belia Heman or Philis Nettle. If  symptoms do not improve or worsen, please contact office for sooner follow up or seek emergency care.    Chronic respiratory failure with hypoxia (HCC) No desaturations on room air today. She became dyspneic within one lap but was able to complete full walk. Continue supplemental oxygen 1 lpm at night. Goal >88-90%  Bladder cancer (HCC) Follow up with urology as scheduled  Cigarette smoker Active smoker. No desire to quit. Understands risks of continued smoking. Smoking cessation advised.     I spent 28 minutes of dedicated to the care of this patient on the date of  this encounter to include pre-visit review of records, face-to-face time with the patient discussing conditions above, post visit ordering of testing, clinical documentation with the electronic health record, making appropriate referrals as documented, and communicating necessary findings to members of the patients care team.  Noemi Chapel, NP 02/25/2023  Pt aware and understands NP's role.

## 2023-02-25 NOTE — Assessment & Plan Note (Signed)
No desaturations on room air today. She became dyspneic within one lap but was able to complete full walk. Continue supplemental oxygen 1 lpm at night. Goal >88-90%

## 2023-02-25 NOTE — Assessment & Plan Note (Signed)
Severe COPD with high symptom burden.  No significant response to [employer]. She has resumed Trelegy. Will update her rx. Encouraged her to work on graded exercises. Smoking cessation imperative. No desire to quit at this time. Action plan in place.  She would be very limited in her ability to work at this time between her severe lung disease, chronic respiratory failure, and bladder cancer requiring multiple visits for treatment/follow ups. She is unable to walk over 30 ft without getting short winded and having to stop to rest. She will start the disability process.   Patient Instructions  Continue Albuterol inhaler 2 puffs every 6 hours as needed for shortness of breath or wheezing. Notify if symptoms persist despite rescue inhaler/neb use.  Continue flonase nasal spray 2 sprays each nostril daily  Continue supplemental oxygen 1 lpm for goal >88-90% Continue Trelegy 1 puff daily. Brush tongue and rinse mouth afterwards   Lung cancer screening CT chest February 2025  Flu shot today   You can bring your disability paperwork in and we will start the process for you  Follow up in 4 months with Dr. Belia Heman or Philis Nettle. If symptoms do not improve or worsen, please contact office for sooner follow up or seek emergency care.

## 2023-02-25 NOTE — Assessment & Plan Note (Signed)
Active smoker. No desire to quit. Understands risks of continued smoking. Smoking cessation advised.

## 2023-02-25 NOTE — Patient Instructions (Addendum)
Continue Albuterol inhaler 2 puffs every 6 hours as needed for shortness of breath or wheezing. Notify if symptoms persist despite rescue inhaler/neb use.  Continue flonase nasal spray 2 sprays each nostril daily  Continue supplemental oxygen 1 lpm for goal >88-90% Continue Trelegy 1 puff daily. Brush tongue and rinse mouth afterwards   Lung cancer screening CT chest February 2025  Flu shot today   You can bring your disability paperwork in and we will start the process for you  Follow up in 4 months with Dr. Belia Heman or Philis Nettle. If symptoms do not improve or worsen, please contact office for sooner follow up or seek emergency care.

## 2023-02-25 NOTE — Assessment & Plan Note (Signed)
Follow up with urology as scheduled.

## 2023-02-26 ENCOUNTER — Ambulatory Visit: Payer: Medicare HMO | Admitting: Urology

## 2023-02-26 VITALS — BP 122/70 | HR 70 | Ht 61.0 in | Wt 140.0 lb

## 2023-02-26 DIAGNOSIS — C679 Malignant neoplasm of bladder, unspecified: Secondary | ICD-10-CM | POA: Diagnosis not present

## 2023-02-26 DIAGNOSIS — D494 Neoplasm of unspecified behavior of bladder: Secondary | ICD-10-CM | POA: Diagnosis not present

## 2023-02-26 LAB — MICROSCOPIC EXAMINATION: WBC, UA: >30 /[HPF] — AB (ref 0–5)

## 2023-02-26 LAB — URINALYSIS, COMPLETE
Bilirubin, UA: NEGATIVE
Ketones, UA: NEGATIVE
Nitrite, UA: NEGATIVE
Protein,UA: NEGATIVE
Specific Gravity, UA: >1.03 — ABNORMAL HIGH (ref 1.005–1.030)
Urobilinogen, Ur: 0.2 mg/dL (ref 0.2–1.0)
pH, UA: 5.5 (ref 5.0–7.5)

## 2023-02-26 MED ORDER — BCG LIVE 50 MG IS SUSR
3.2400 mL | Freq: Once | INTRAVESICAL | Status: AC
Start: 2023-02-26 — End: 2023-02-26
  Administered 2023-02-26: 81 mg via INTRAVESICAL

## 2023-02-26 NOTE — Addendum Note (Signed)
Addended byRanda Lynn on: 02/26/2023 04:59 PM   Modules accepted: Orders

## 2023-03-01 ENCOUNTER — Other Ambulatory Visit: Payer: Self-pay | Admitting: Internal Medicine

## 2023-03-01 DIAGNOSIS — K219 Gastro-esophageal reflux disease without esophagitis: Secondary | ICD-10-CM

## 2023-03-03 ENCOUNTER — Telehealth: Payer: Medicare HMO

## 2023-03-03 NOTE — Progress Notes (Unsigned)
BCG Bladder Instillation  BCG # 3/6  Due to Bladder Cancer patient is present today for a BCG treatment. Patient was cleaned and prepped in a sterile fashion with betadine. A 14 FR catheter was inserted, urine return was noted ***ml, urine was *** in color.  50ml of reconstituted BCG was instilled into the bladder. The catheter was then removed. Patient tolerated well, no complications were noted  Performed by: ***  Follow up/ Additional notes: Follow-up in 1 week for number 4 out of 6 BCG

## 2023-03-04 NOTE — Telephone Encounter (Signed)
Requested Prescriptions  Pending Prescriptions Disp Refills   pantoprazole (PROTONIX) 40 MG tablet [Pharmacy Med Name: PANTOPRAZOLE SOD DR 40 MG TAB] 90 tablet 0    Sig: TAKE 1 TABLET (40 MG TOTAL) BY MOUTH DAILY AS NEEDED (HEARTBURN).     Gastroenterology: Proton Pump Inhibitors Passed - 03/01/2023  8:29 AM      Passed - Valid encounter within last 12 months    Recent Outpatient Visits           3 months ago Hyperlipidemia associated with type 2 diabetes mellitus [facility])   [place] [facility] [place], Gentry Fitz A, RPH-CPP   5 months ago Aortic atherosclerosis [facility])   [place] [facility] [place], [place] W, NP   6 months ago Type 2 diabetes mellitus with hyperglycemia, without long-term current use of insulin [facility])   [place] [facility] Delles, Gentry Fitz A, RPH-CPP   8 months ago Type 2 diabetes mellitus with hyperglycemia, without long-term current use of insulin [facility])   [place] [facility] Delles, Gentry Fitz A, RPH-CPP   9 months ago Abnormal uterine bleeding   [place] [facility] [place], Salvadore Oxford, NP       Future Appointments             Tomorrow McGowan, Elana [facility] [place]   In 1 week McGowan, [facility] [place]   In 2 weeks [place], Salvadore Oxford, NP [place] [facility]   In 2 weeks Marvel Plan, [facility] [place]   In 4 weeks Carman Ching, [facility] [place]

## 2023-03-05 ENCOUNTER — Ambulatory Visit: Payer: Medicare HMO | Admitting: Urology

## 2023-03-05 DIAGNOSIS — D494 Neoplasm of unspecified behavior of bladder: Secondary | ICD-10-CM

## 2023-03-06 ENCOUNTER — Other Ambulatory Visit: Payer: Self-pay | Admitting: Internal Medicine

## 2023-03-07 NOTE — Telephone Encounter (Signed)
Requested medication (s) are due for refill today -yes  Requested medication (s) are on the active medication list -yes  Future visit scheduled -yes  Last refill: 01/02/23 #30  Notes to clinic: non delegated Rx  Requested Prescriptions  Pending Prescriptions Disp Refills   clonazePAM (KLONOPIN) 0.5 MG tablet [Pharmacy Med Name: CLONAZEPAM 0.5 MG TABLET] 30 tablet 0    Sig: TAKE 1 TABLET BY MOUTH EVERY DAY AS NEEDED FOR ANXIETY     Not Delegated - Psychiatry: Anxiolytics/Hypnotics 2 Failed - 03/06/2023  3:06 PM      Failed - This refill cannot be delegated      Failed - Urine Drug Screen completed in last 360 days      Passed - Patient is not pregnant      Passed - Valid encounter within last 6 months    Recent Outpatient Visits           3 months ago Hyperlipidemia associated with type 2 diabetes mellitus (HCC)   [place] [facility] Delles, Gentry Fitz A, RPH-CPP   5 months ago Aortic atherosclerosis [facility])   [place] [facility] [place], [place] W, NP   6 months ago Type 2 diabetes mellitus with hyperglycemia, without long-term current use of insulin (HCC)   [place] Delles, Gentry Fitz A, RPH-CPP   8 months ago Type 2 diabetes mellitus with hyperglycemia, without long-term current use of insulin (HCC)   [place] [facility] Delles, Gentry Fitz A, RPH-CPP   9 months ago Abnormal uterine bleeding   [facility] [place], Salvadore Oxford, NP       Future Appointments             In 5 days [place], Elana Alm [facility] [place]   In 1 week [place], Salvadore Oxford, NP [facility]   In 1 week Marvel Plan, [place] [facility] [place]   In 3 weeks Carman Ching, [facility] [place]               Requested Prescriptions  Pending Prescriptions Disp Refills   clonazePAM (KLONOPIN) 0.5 MG tablet  [Pharmacy Med Name: CLONAZEPAM 0.5 MG TABLET] 30 tablet 0    Sig: TAKE 1 TABLET BY MOUTH EVERY DAY AS NEEDED FOR ANXIETY     Not Delegated - Psychiatry: Anxiolytics/Hypnotics 2 Failed - 03/06/2023  3:06 PM      Failed - This refill cannot be delegated      Failed - Urine Drug Screen completed in last 360 days      Passed - Patient is not pregnant      Passed - Valid encounter within last 6 months    Recent Outpatient Visits           3 months ago Hyperlipidemia associated with type 2 diabetes mellitus [facility])   [place] [facility] Delles, Gentry Fitz A, RPH-CPP   5 months ago Aortic atherosclerosis [facility])   [place] [facility] [place], [place] W, NP   6 months ago Type 2 diabetes mellitus with hyperglycemia, without long-term current use of insulin [facility])   [place] [facility] Delles, Gentry Fitz A, RPH-CPP   8 months ago Type 2 diabetes mellitus with hyperglycemia, without long-term current use of insulin [facility])   [place] [facility] Delles, Gentry Fitz A, RPH-CPP   9 months ago  Abnormal uterine bleeding   [place] [facility] [place], Salvadore Oxford, NP       Future Appointments             In 5 days [place], Elana Al[place] [facility] [place]   In 1 week [place], Salvadore Oxford, NP [place] [facility]   In 1 week Marvel Plan, Elana Al[place] [facility] [place]   In 3 weeks Carman Ching, [facility] [place]

## 2023-03-10 NOTE — Progress Notes (Unsigned)
BCG Bladder Instillation  BCG # 3/6   Due to Bladder Cancer patient is present today for a BCG treatment. Patient was cleaned and prepped in a sterile fashion with betadine. A 14 FR catheter was inserted, urine return was noted 50 ml, urine was yellow  in color.  50ml of reconstituted BCG was instilled into the bladder. The catheter was then removed. Patient tolerated well, no complications were noted  Performed by: Michiel Cowboy, PA-C and Annabell Sabal, CMA   Follow up/ Additional notes: She will follow-up in 1 week for number 4 out of 6 BCG

## 2023-03-12 ENCOUNTER — Ambulatory Visit: Payer: 59 | Admitting: Urology

## 2023-03-12 ENCOUNTER — Encounter: Payer: Self-pay | Admitting: Urology

## 2023-03-12 VITALS — BP 123/74 | HR 66 | Ht 61.0 in | Wt 140.0 lb

## 2023-03-12 DIAGNOSIS — D494 Neoplasm of unspecified behavior of bladder: Secondary | ICD-10-CM

## 2023-03-12 DIAGNOSIS — C679 Malignant neoplasm of bladder, unspecified: Secondary | ICD-10-CM

## 2023-03-12 LAB — URINALYSIS, COMPLETE
Bilirubin, UA: NEGATIVE
Ketones, UA: NEGATIVE
Nitrite, UA: POSITIVE — AB
Protein,UA: NEGATIVE
RBC, UA: NEGATIVE
Specific Gravity, UA: 1.02 (ref 1.005–1.030)
Urobilinogen, Ur: 0.2 mg/dL (ref 0.2–1.0)
pH, UA: 7 (ref 5.0–7.5)

## 2023-03-12 LAB — MICROSCOPIC EXAMINATION: Epithelial Cells (non renal): >10 /[HPF] — AB (ref 0–10)

## 2023-03-12 MED ORDER — BCG LIVE 50 MG IS SUSR
3.2400 mL | Freq: Once | INTRAVESICAL | Status: AC
Start: 2023-03-12 — End: 2023-03-12
  Administered 2023-03-12: 81 mg via INTRAVESICAL

## 2023-03-17 ENCOUNTER — Telehealth: Payer: Medicare HMO

## 2023-03-17 ENCOUNTER — Telehealth: Payer: Self-pay | Admitting: Pharmacist

## 2023-03-17 NOTE — Progress Notes (Unsigned)
BCG Bladder Instillation  BCG # 4/6  Due to Bladder Cancer patient is present today for a BCG treatment. Patient was cleaned and prepped in a sterile fashion with betadine. A 14 FR catheter was inserted, urine return was noted 20 ml, urine was yellow in color.  50ml of reconstituted BCG was instilled into the bladder. The catheter was then removed. Patient tolerated well, no complications were noted  Performed by: Michiel Cowboy, PA-C and [place], CMA   Follow up/ Additional notes: One week for # 5/6 BCG

## 2023-03-17 NOTE — Telephone Encounter (Signed)
   Outreach Note  03/17/2023 Name: Libra Bookbinder MRN: [id number] DOB: 22-Sep-1965  Referred by: Lorre Munroe, NP  Was unable to reach patient via telephone today and unable to leave a message as no voicemail picks up   Follow Up Plan: Will collaborate with Care Guide to outreach to schedule follow up with me  Estelle Grumbles, PharmD, [facility] [phone]

## 2023-03-19 ENCOUNTER — Ambulatory Visit (INDEPENDENT_AMBULATORY_CARE_PROVIDER_SITE_OTHER): Payer: 59 | Admitting: Urology

## 2023-03-19 ENCOUNTER — Encounter: Payer: Self-pay | Admitting: Internal Medicine

## 2023-03-19 ENCOUNTER — Ambulatory Visit (INDEPENDENT_AMBULATORY_CARE_PROVIDER_SITE_OTHER): Payer: 59 | Admitting: Internal Medicine

## 2023-03-19 VITALS — BP 110/60 | HR 73 | Temp 97.9°F | Ht 61.0 in | Wt 141.0 lb

## 2023-03-19 DIAGNOSIS — F17200 Nicotine dependence, unspecified, uncomplicated: Secondary | ICD-10-CM

## 2023-03-19 DIAGNOSIS — J9610 Chronic respiratory failure, unspecified whether with hypoxia or hypercapnia: Secondary | ICD-10-CM | POA: Diagnosis not present

## 2023-03-19 DIAGNOSIS — J449 Chronic obstructive pulmonary disease, unspecified: Secondary | ICD-10-CM | POA: Diagnosis not present

## 2023-03-19 DIAGNOSIS — D494 Neoplasm of unspecified behavior of bladder: Secondary | ICD-10-CM

## 2023-03-19 DIAGNOSIS — J9611 Chronic respiratory failure with hypoxia: Secondary | ICD-10-CM

## 2023-03-19 DIAGNOSIS — F17210 Nicotine dependence, cigarettes, uncomplicated: Secondary | ICD-10-CM

## 2023-03-19 DIAGNOSIS — J069 Acute upper respiratory infection, unspecified: Secondary | ICD-10-CM | POA: Diagnosis not present

## 2023-03-19 LAB — URINALYSIS, COMPLETE
Bilirubin, UA: NEGATIVE
Glucose, UA: NEGATIVE
Ketones, UA: NEGATIVE
Nitrite, UA: POSITIVE — AB
Protein,UA: NEGATIVE
RBC, UA: NEGATIVE
Specific Gravity, UA: >1.03 — ABNORMAL HIGH (ref 1.005–1.030)
Urobilinogen, Ur: 0.2 mg/dL (ref 0.2–1.0)
pH, UA: 5.5 (ref 5.0–7.5)

## 2023-03-19 LAB — MICROSCOPIC EXAMINATION
Epithelial Cells (non renal): >10 /[HPF] — AB (ref 0–10)
WBC, UA: >30 /[HPF] — AB (ref 0–5)

## 2023-03-19 MED ORDER — PREDNISONE 10 MG PO TABS
ORAL_TABLET | ORAL | 0 refills | Status: DC
Start: 2023-03-19 — End: 2023-04-01

## 2023-03-19 MED ORDER — BCG LIVE 50 MG IS SUSR
3.2400 mL | Freq: Once | INTRAVESICAL | Status: AC
Start: 2023-03-19 — End: 2023-03-19
  Administered 2023-03-19: 81 mg via INTRAVESICAL

## 2023-03-19 MED ORDER — PROMETHAZINE-DM 6.25-15 MG/5ML PO SYRP: 5.0000 mL | ORAL_SOLUTION | Freq: Four times a day (QID) | ORAL | 0 refills | Status: DC | PRN

## 2023-03-19 NOTE — Progress Notes (Signed)
Subjective:    Patient ID: Brandi Calderon, female    DOB: Sep 14, 1965, 57 y.o.   MRN: [id number]  HPI   Discussed the use of AI scribe software for clinical note transcription with the patient, who gave verbal consent to proceed.  The patient, with a history of COPD, emphysema, and a current smoker, presents with symptoms suggestive of an upper respiratory infection. She reports the onset of symptoms a couple of days ago, including ear pain, runny nose, and a sore throat. The patient describes the nasal discharge as thick mucus and admits to occasional difficulty swallowing. She also reports a cough, but it is unclear if this is a new symptom or related to her chronic lung disease.  The patient has been in contact with her six-year-old granddaughter, who recently had a cold, suggesting a possible source of infection. She denies any fever but does report feeling cold.  In addition to these symptoms, the patient mentions experiencing shortness of breath, particularly when walking. She has been evaluated by a pulmonologist who found that her oxygen levels drop with exertion. The patient is currently on nightly oxygen therapy at one liter.  The patient's current medications for COPD include Trelegy and Albuterol, which she reports taking as prescribed. She has not taken any new medications for her current symptoms.  The patient also mentions plans to quit smoking at the start of the new year and has obtained Chantix for this purpose.      Review of Systems     Past Medical History:  Diagnosis Date   Anxiety    Aortic atherosclerosis (HCC)    Arthritis    Asthma    Back pain    Bladder cancer (HCC)    Bladder tumor    COPD (chronic obstructive pulmonary disease) (HCC)    Depression    GERD (gastroesophageal reflux disease)    Headache    History of COVID-19    Hyperlipidemia    Hypertension    Neuromuscular disorder (HCC)    Sleep apnea    Tendonitis    Type 2 diabetes  mellitus without complication (HCC)    Urinary tract infection due to extended-spectrum beta lactamase (ESBL) producing Escherichia coli 05/22/2022    Current Outpatient Medications  Medication Sig Dispense Refill   albuterol (VENTOLIN HFA) 108 (90 Base) MCG/ACT inhaler INHALE 1-2 PUFFS BY MOUTH EVERY 6 HOURS AS NEEDED FOR WHEEZE OR SHORTNESS OF BREATH 8.5 each 2   aspirin EC 81 MG tablet Take 1 tablet (81 mg total) by mouth daily. Swallow whole. 30 tablet 12   Aspirin-Salicylamide-Caffeine (BC HEADACHE POWDER PO) Take 4 packets by mouth daily as needed (pain).     atorvastatin (LIPITOR) 80 MG tablet Take 80 mg by mouth at bedtime.     benzonatate (TESSALON) 200 MG capsule Take 1 capsule (200 mg total) by mouth 3 (three) times daily as needed for cough. 30 capsule 0   Blood Glucose Monitoring Suppl (ONETOUCH VERIO FLEX SYSTEM) w/Device KIT Use to check blood sugar daily.  DX E11.9 1 kit 1   clonazePAM (KLONOPIN) 0.5 MG tablet TAKE 1 TABLET BY MOUTH EVERY DAY AS NEEDED FOR ANXIETY 30 tablet 0   DULoxetine (CYMBALTA) 30 MG capsule Take 30 mg by mouth at bedtime.     fluticasone (FLONASE) 50 MCG/ACT nasal spray SPRAY 2 SPRAYS INTO EACH NOSTRIL EVERY DAY 48 mL 0   Fluticasone-Umeclidin-Vilant (TRELEGY ELLIPTA) 100-62.5-25 MCG/ACT AEPB Inhale 1 puff into the lungs daily. 60 each 6  furosemide (LASIX) 40 MG tablet TAKE 1 TABLET BY MOUTH EVERY DAY 90 tablet 0   gabapentin (NEURONTIN) 300 MG capsule Take 300 mg by mouth at bedtime.     glipiZIDE (GLUCOTROL XL) 5 MG 24 hr tablet Take 5 mg by mouth daily with breakfast.     glucose blood test strip Use as instructed 100 each 0   JARDIANCE 25 MG TABS tablet Take 25 mg by mouth every morning.     Lancets (ONETOUCH ULTRASOFT) lancets Use as instructed 100 each 12   lisinopril (ZESTRIL) 5 MG tablet Take 5 mg by mouth every morning.     metFORMIN (GLUCOPHAGE) 1000 MG tablet TAKE 1 TABLET (1,000 MG TOTAL) BY MOUTH 2 (TWO) TIMES DAILY WITH A MEAL. 60 tablet 0    ondansetron (ZOFRAN) 4 MG tablet Take 4 mg by mouth every 8 (eight) hours as needed for nausea.     oxybutynin (DITROPAN XL) 15 MG 24 hr tablet Take 1 tablet (15 mg total) by mouth daily. 90 tablet 3   OXYGEN Inhale 1 L into the lungs at bedtime.     pantoprazole (PROTONIX) 40 MG tablet TAKE 1 TABLET (40 MG TOTAL) BY MOUTH DAILY AS NEEDED (HEARTBURN). 90 tablet 1   polyethylene glycol (MIRALAX) 17 g packet Take 17 g by mouth daily. (Patient taking differently: Take 17 g by mouth daily as needed.) 14 each 0   Semaglutide, 2 MG/DOSE, 8 MG/3ML SOPN Inject 0.75 mLs into the skin once a week. Monday     No current facility-administered medications for this visit.    Allergies  Allergen Reactions   Penicillins Anaphylaxis    Has patient had a PCN reaction causing immediate rash, facial/tongue/throat swelling, SOB or lightheadedness with hypotension: Yes Has patient had a PCN reaction causing severe rash involving mucus membranes or skin necrosis: No Has patient had a PCN reaction that required hospitalization: No Has patient had a PCN reaction occurring within the last 10 years: No If all of the above answers are "NO", then may proceed with Cephalosporin use.   Throat swells   Victoza [Liraglutide]     Severe nausea   Vicodin [Hydrocodone-Acetaminophen] Rash    And hives    Family History  Problem Relation Age of Onset   Diabetes Mother    Kidney disease Father    Diabetes Father    Dementia Father    Diabetes Sister    Diabetes Brother    Diabetes Brother    Colon cancer Neg Hx    Esophageal cancer Neg Hx    Stomach cancer Neg Hx    Rectal cancer Neg Hx     Social History   Socioeconomic History   Marital status: Divorced    Spouse name: Not on file   Number of children: 3   Years of education: Not on file   Highest education level: Not on file  Occupational History   Not on file  Tobacco Use   Smoking status: Every Day    Current packs/day: 1.00    Average  packs/day: 1 pack/day for 41.0 years (41.0 ttl pk-yrs)    Types: Cigarettes    Start date: 1984    Passive exposure: Current   Smokeless tobacco: Never   Tobacco comments:    1PPD 06/10/2022  Vaping Use   Vaping status: Never Used  Substance and Sexual Activity   Alcohol use: No   Drug use: Yes    Types: Marijuana    Comment: States she smokes  MJ when she does not have any Tramadol.   Sexual activity: Yes  Other Topics Concern   Not on file  Social History Narrative   ** Merged History Encounter ** In the process of getting SSD for depression, DM and neuropathy      Active smoker [~ 2p/day; cutting down]; no alcohol; lives in [place]; self. Used to work in [employer].    Social Drivers of Corporate investment banker Strain: Low Risk  (02/24/2023)   [facility]   Overall Financial Resource Strain (CARDIA)    Difficulty of Paying Living Expenses: Not hard at all  Food Insecurity: No Food Insecurity (02/24/2023)   [facility]   Hunger Vital Sign    Worried About Running Out of Food in the Last Year: Never true    Ran Out of Food in the Last Year: Never true  Transportation Needs: No Transportation Needs (02/24/2023)   Received from [facility] - Transportation    In the past 12 months, has lack of transportation kept you from medical appointments or from getting medications?: No    Lack of Transportation (Non-Medical): No  Physical Activity: Insufficiently Active (02/07/2023)   Exercise Vital Sign    Days of Exercise per Week: 3 days    Minutes of Exercise per Session: 30 min  Stress: Stress Concern Present (02/07/2023)   [employer] of Occupational Health - Occupational Stress Questionnaire    Feeling of Stress : To some extent  Social Connections: Socially Isolated (02/07/2023)   Social Connection and Isolation Panel [NHANES]    Frequency of Communication with Friends and Family:  More than three times a week    Frequency of Social Gatherings with Friends and Family: Once a week    Attends Religious Services: Never    Database administrator or Organizations: No    Attends Banker Meetings: Never    Marital Status: Divorced  Catering manager Violence: Not At Risk (02/07/2023)   Humiliation, Afraid, Rape, and Kick questionnaire    Fear of Current or Ex-Partner: No    Emotionally Abused: No    Physically Abused: No    Sexually Abused: No     Constitutional: Denies fever, malaise, fatigue, headache or abrupt weight changes.  HEENT: Pt reports ear pain, runny nose, and sore throat. Denies eye pain, eye redness, ringing in the ears, wax buildup, nasal congestion, bloody nose. Respiratory: Pt reports cough and shortness of breath. Denies difficulty breathing.   Cardiovascular: Denies chest pain, chest tightness, palpitations or swelling in the hands or feet.  Gastrointestinal: Denies abdominal pain, bloating, constipation, diarrhea or blood in the stool.   No other specific complaints in a complete review of systems (except as listed in HPI above).  Objective:   Physical Exam BP 110/60   Pulse 73   Temp 97.9 F (36.6 C)   Ht 5\' 1"  (1.549 m)   Wt 141 lb (64 kg)   LMP 12/01/2017   SpO2 98%   BMI 26.64 kg/m    Wt Readings from Last 3 Encounters:  03/12/23 140 lb (63.5 kg)  02/26/23 140 lb (63.5 kg)  02/25/23 140 lb 6.4 oz (63.7 kg)    General: Appears her stated age, overweight, in NAD. Skin: Warm, dry and intact.  HEENT: Head: normal shape and size, no sinus tenderness noted; Eyes: sclera white, no icterus, conjunctiva pink, PERRLA and EOMs intact; Nose: mucosa boggy and  moist, turbinates swollen; Throat: Mucosa erythematous and moist,, + PND, no exudate or lesions noted. Neck: No adenopathy noted. Cardiovascular: Normal rate and rhythm. S1,S2 noted.  No murmur, rubs or gallops noted.  Pulmonary/Chest: Normal effort and diminished breath  sounds. No respiratory distress. No wheezes, rales or ronchi noted.  Neurological: Alert and oriented.   BMET    Component Value Date/Time   NA 140 12/31/2022 1433   NA 139 06/25/2017 1027   NA 134 (L) 12/07/2012 1809   K 3.8 12/31/2022 1433   K 3.4 (L) 12/07/2012 1809   CL 102 12/31/2022 1433   CL 103 12/07/2012 1809   CO2 29 12/31/2022 1433   CO2 25 12/07/2012 1809   GLUCOSE 95 12/31/2022 1433   GLUCOSE 274 (H) 12/07/2012 1809   BUN 17 12/31/2022 1433   BUN 10 06/25/2017 1027   BUN 10 12/07/2012 1809   CREATININE 1.00 12/31/2022 1433   CREATININE 0.71 03/14/2022 1336   CALCIUM 9.4 12/31/2022 1433   CALCIUM 8.6 12/07/2012 1809   GFRNONAA >60 12/31/2022 1433   GFRNONAA 84 09/07/2019 1019   GFRAA 97 09/07/2019 1019    Lipid Panel     Component Value Date/Time   CHOL 170 03/14/2022 1336   CHOL 120 12/24/2016 1827   TRIG 132 03/14/2022 1336   HDL 44 (L) 03/14/2022 1336   HDL 29 (L) 12/24/2016 1827   CHOLHDL 3.9 03/14/2022 1336   LDLCALC 103 (H) 03/14/2022 1336    CBC    Component Value Date/Time   WBC 9.2 12/31/2022 1433   RBC 3.98 12/31/2022 1433   HGB 11.9 (L) 12/31/2022 1433   HGB 12.8 06/25/2017 1027   HCT 36.0 12/31/2022 1433   HCT 38.3 06/25/2017 1027   PLT 292 12/31/2022 1433   PLT 348 06/25/2017 1027   MCV 90.5 12/31/2022 1433   MCV 90 06/25/2017 1027   MCV 80 12/07/2012 1809   MCH 29.9 12/31/2022 1433   MCHC 33.1 12/31/2022 1433   RDW 15.1 12/31/2022 1433   RDW 15.0 06/25/2017 1027   RDW 17.8 (H) 12/07/2012 1809   LYMPHSABS 1.3 06/12/2022 0516   LYMPHSABS 3.3 (H) 09/11/2016 1131   MONOABS 0.8 06/12/2022 0516   EOSABS 0.2 06/12/2022 0516   EOSABS 0.2 09/11/2016 1131   BASOSABS 0.0 06/12/2022 0516   BASOSABS 0.0 09/11/2016 1131    Hgb A1C Lab Results  Component Value Date   HGBA1C 7.6 08/01/2022            Assessment & Plan:   Assessment and Plan    Viral Upper Respiratory Infection with Cough Symptoms of ear pain, runny nose,  and sore throat for 2 days. No fever or severe throat pain. No signs of bacterial infection. -She declines testing for covid, flu or strep -Prescribe Prednisone taper for 6 days to reduce inflammation and symptoms. Monitor sugars. -Recommend over-the-counter Flonase nasal spray and an allergy pill (Claritin, Allegra, or Zyrtec) daily for a week. -Prescribe Promethazine DM cough syrup, to be taken every 4 hours as needed for cough. -Advise patient to contact the office if not improved by Monday.  Chronic Obstructive Pulmonary Disease (COPD) with Chronic Respiratory Failure Patient reports oxygen saturation drops with exertion. Currently on 1L oxygen at night and using Trelegy and Albuterol as prescribed. -Continue current management. -Advise patient to follow up with pulmonologist.  Smoking Cessation Patient plans to quit smoking at the start of the new year and has Chantix ready. -Encourage patient in this decision and advise  to start Chantix as planned.       Schedule an appointment for your annual exam Nicki Reaper, NP

## 2023-03-19 NOTE — Patient Instructions (Signed)

## 2023-03-27 ENCOUNTER — Other Ambulatory Visit: Payer: Self-pay | Admitting: Acute Care

## 2023-03-27 DIAGNOSIS — F17210 Nicotine dependence, cigarettes, uncomplicated: Secondary | ICD-10-CM

## 2023-03-27 DIAGNOSIS — Z87891 Personal history of nicotine dependence: Secondary | ICD-10-CM

## 2023-03-31 ENCOUNTER — Ambulatory Visit: Payer: Self-pay | Admitting: *Deleted

## 2023-03-31 NOTE — Patient Outreach (Signed)
  Care Coordination   Follow Up Visit Note   03/31/2023 Name: Brandi Calderon MRN: [id number] DOB: Jan 19, 1966  Brandi Calderon is a 57 y.o. year old female who sees [place], Salvadore Oxford, NP for primary care. I spoke with  Brandi Calderon by phone today.  What matters to the patients health and wellness today?  Patient denies having any complications from cancer treatment, but she has experienced a cough lately.  Denies fever or shortness of breath, state it is getting better.  Denies any urgent concerns, encouraged to contact this care manager with questions.      Goals Addressed             This Visit's Progress    Effective treatment for bladder cancer   On track    Interventions Today    Flowsheet Row Most Recent Value  Chronic Disease   Chronic disease during today's visit Diabetes, Other  [bladder cancer]  General Interventions   General Interventions Discussed/Reviewed General Interventions Reviewed, Doctor Visits, [employer] with cancer treatments at urology office]  Doctor Visits Discussed/Reviewed Doctor Visits Reviewed, PCP, Specialist  [cancer treatment 12/31, PCP 1/2, urology 2/18]  PCP/Specialist Visits Compliance with follow-up visit  Education Interventions   Education Provided Provided Education  Provided Verbal Education On Blood Sugar Monitoring, Medication, When to see the doctor           Management of DM   On track    Care Coordination Interventions: Provided education to patient about basic DM disease process Reviewed medications with patient and discussed importance of medication adherence Counseled on importance of regular laboratory monitoring as prescribed Discussed plans with patient for ongoing care management follow up and provided patient with direct contact information for care management team Provided patient with written educational materials related to hypo and hyperglycemia and importance of correct treatment          SDOH assessments and interventions completed:  No     Care Coordination Interventions:  No, not indicated   Follow up plan: Follow up call scheduled for 2/24    Encounter Outcome:  Patient Visit Completed   Rodney Langton, RN, MSN, CCM [place]  [facility], [facility] RN Care Coordinator Direct Dial: [phone] / Main [phone] Fax [phone] Email: [email or web address]

## 2023-04-01 ENCOUNTER — Ambulatory Visit (INDEPENDENT_AMBULATORY_CARE_PROVIDER_SITE_OTHER): Payer: 59 | Admitting: Physician Assistant

## 2023-04-01 VITALS — BP 117/71 | HR 81

## 2023-04-01 DIAGNOSIS — N3281 Overactive bladder: Secondary | ICD-10-CM

## 2023-04-01 DIAGNOSIS — C679 Malignant neoplasm of bladder, unspecified: Secondary | ICD-10-CM | POA: Diagnosis not present

## 2023-04-01 DIAGNOSIS — D494 Neoplasm of unspecified behavior of bladder: Secondary | ICD-10-CM

## 2023-04-01 LAB — URINALYSIS, COMPLETE
Bilirubin, UA: NEGATIVE
Ketones, UA: NEGATIVE
Nitrite, UA: POSITIVE — AB
Protein,UA: NEGATIVE
RBC, UA: NEGATIVE
Specific Gravity, UA: 1.025 (ref 1.005–1.030)
Urobilinogen, Ur: 0.2 mg/dL (ref 0.2–1.0)
pH, UA: 5 (ref 5.0–7.5)

## 2023-04-01 LAB — MICROSCOPIC EXAMINATION
Epithelial Cells (non renal): >10 /[HPF] — AB (ref 0–10)
WBC, UA: >30 /[HPF] — AB (ref 0–5)

## 2023-04-01 MED ORDER — BCG LIVE 50 MG IS SUSR
3.2400 mL | Freq: Once | INTRAVESICAL | Status: AC
Start: 2023-04-01 — End: 2023-04-01
  Administered 2023-04-01: 81 mg via INTRAVESICAL

## 2023-04-01 NOTE — Progress Notes (Signed)
 BCG Bladder Instillation  BCG # 5 of 6  Due to Bladder Cancer patient is present today for a BCG treatment. Patient was cleaned and prepped in a sterile fashion with betadine. A 14FR catheter was inserted, urine return was noted 50ml, urine was yellow in color.  50ml of reconstituted BCG was instilled into the bladder. The catheter was then removed. Patient tolerated well, no complications were noted  Performed by: Jerline Linzy, PA-C and Anastasiya Hopkins, CMA  Follow up/ Additional notes: 1 week for BCG #6

## 2023-04-03 ENCOUNTER — Ambulatory Visit (INDEPENDENT_AMBULATORY_CARE_PROVIDER_SITE_OTHER): Payer: 59 | Admitting: Internal Medicine

## 2023-04-03 ENCOUNTER — Encounter: Payer: Self-pay | Admitting: Internal Medicine

## 2023-04-03 VITALS — BP 90/50 | Ht 61.0 in | Wt 140.0 lb

## 2023-04-03 DIAGNOSIS — E663 Overweight: Secondary | ICD-10-CM | POA: Diagnosis not present

## 2023-04-03 DIAGNOSIS — Z6826 Body mass index (BMI) 26.0-26.9, adult: Secondary | ICD-10-CM

## 2023-04-03 DIAGNOSIS — E1142 Type 2 diabetes mellitus with diabetic polyneuropathy: Secondary | ICD-10-CM

## 2023-04-03 DIAGNOSIS — Z1231 Encounter for screening mammogram for malignant neoplasm of breast: Secondary | ICD-10-CM | POA: Diagnosis not present

## 2023-04-03 DIAGNOSIS — Z0001 Encounter for general adult medical examination with abnormal findings: Secondary | ICD-10-CM

## 2023-04-03 LAB — POCT GLYCOSYLATED HEMOGLOBIN (HGB A1C): Hemoglobin A1C: 7.1 % — AB (ref 4.0–5.6)

## 2023-04-03 NOTE — Patient Instructions (Signed)
 Health Maintenance for Postmenopausal Women Menopause is a normal process in which your ability to get pregnant comes to an end. This process happens slowly over many months or years, usually between the ages of 76 and 38. Menopause is complete when you have missed your menstrual period for 12 months. It is important to talk with your health care provider about some of the most common conditions that affect women after menopause (postmenopausal women). These include heart disease, cancer, and bone loss (osteoporosis). Adopting a healthy lifestyle and getting preventive care can help to promote your health and wellness. The actions you take can also lower your chances of developing some of these common conditions. What are the signs and symptoms of menopause? During menopause, you may have the following symptoms: Hot flashes. These can be moderate or severe. Night sweats. Decrease in sex drive. Mood swings. Headaches. Tiredness (fatigue). Irritability. Memory problems. Problems falling asleep or staying asleep. Talk with your health care provider about treatment options for your symptoms. Do I need hormone replacement therapy? Hormone replacement therapy is effective in treating symptoms that are caused by menopause, such as hot flashes and night sweats. Hormone replacement carries certain risks, especially as you become older. If you are thinking about using estrogen or estrogen with progestin, discuss the benefits and risks with your health care provider. How can I reduce my risk for heart disease and stroke? The risk of heart disease, heart attack, and stroke increases as you age. One of the causes may be a change in the body's hormones during menopause. This can affect how your body uses dietary fats, triglycerides, and cholesterol. Heart attack and stroke are medical emergencies. There are many things that you can do to help prevent heart disease and stroke. Watch your blood pressure High  blood pressure causes heart disease and increases the risk of stroke. This is more likely to develop in people who have high blood pressure readings or are overweight. Have your blood pressure checked: Every 3-5 years if you are 32-23 years of age. Every year if you are 31 years old or older. Eat a healthy diet  Eat a diet that includes plenty of vegetables, fruits, low-fat dairy products, and lean protein. Do not eat a lot of foods that are high in solid fats, added sugars, or sodium. Get regular exercise Get regular exercise. This is one of the most important things you can do for your health. Most adults should: Try to exercise for at least 150 minutes each week. The exercise should increase your heart rate and make you sweat (moderate-intensity exercise). Try to do strengthening exercises at least twice each week. Do these in addition to the moderate-intensity exercise. Spend less time sitting. Even light physical activity can be beneficial. Other tips Work with your health care provider to achieve or maintain a healthy weight. Do not use any products that contain nicotine or tobacco. These products include cigarettes, chewing tobacco, and vaping devices, such as e-cigarettes. If you need help quitting, ask your health care provider. Know your numbers. Ask your health care provider to check your cholesterol and your blood sugar (glucose). Continue to have your blood tested as directed by your health care provider. Do I need screening for cancer? Depending on your health history and family history, you may need to have cancer screenings at different stages of your life. This may include screening for: Breast cancer. Cervical cancer. Lung cancer. Colorectal cancer. What is my risk for osteoporosis? After menopause, you may be  at increased risk for osteoporosis. Osteoporosis is a condition in which bone destruction happens more quickly than new bone creation. To help prevent osteoporosis or  the bone fractures that can happen because of osteoporosis, you may take the following actions: If you are 24-54 years old, get at least 1,000 mg of calcium and at least 600 international units (IU) of vitamin D  per day. If you are older than age 75 but younger than age 30, get at least 1,200 mg of calcium and at least 600 international units (IU) of vitamin D  per day. If you are older than age 8, get at least 1,200 mg of calcium and at least 800 international units (IU) of vitamin D  per day. Smoking and drinking excessive alcohol increase the risk of osteoporosis. Eat foods that are rich in calcium and vitamin D , and do weight-bearing exercises several times each week as directed by your health care provider. How does menopause affect my mental health? Depression may occur at any age, but it is more common as you become older. Common symptoms of depression include: Feeling depressed. Changes in sleep patterns. Changes in appetite or eating patterns. Feeling an overall lack of motivation or enjoyment of activities that you previously enjoyed. Frequent crying spells. Talk with your health care provider if you think that you are experiencing any of these symptoms. General instructions See your health care provider for regular wellness exams and vaccines. This may include: Scheduling regular health, dental, and eye exams. Getting and maintaining your vaccines. These include: Influenza vaccine. Get this vaccine each year before the flu season begins. Pneumonia vaccine. Shingles vaccine. Tetanus, diphtheria, and pertussis (Tdap) booster vaccine. Your health care provider may also recommend other immunizations. Tell your health care provider if you have ever been abused or do not feel safe at home. Summary Menopause is a normal process in which your ability to get pregnant comes to an end. This condition causes hot flashes, night sweats, decreased interest in sex, mood swings, headaches, or lack  of sleep. Treatment for this condition may include hormone replacement therapy. Take actions to keep yourself healthy, including exercising regularly, eating a healthy diet, watching your weight, and checking your blood pressure and blood sugar levels. Get screened for cancer and depression. Make sure that you are up to date with all your vaccines. This information is not intended to replace advice given to you by your health care provider. Make sure you discuss any questions you have with your health care provider. Document Revised: 08/07/2020 Document Reviewed: 08/07/2020 Elsevier Patient Education  2024 [employer].

## 2023-04-03 NOTE — Progress Notes (Signed)
 Subjective:    Patient ID: Brandi Calderon, female    DOB: 03/09/1966, 58 y.o.   MRN: [id number]  HPI  Patient presents to clinic today for annual exam.  Flu: 01/2023 Tetanus: > 10 years ago COVID: Pfizer x 1 Pneumovax: 03/2022 Shingrix: Never Pap smear: 08/2019 Mammogram: >2 years ago Colon screening: 05/2018 Vision screening: annually Dentist: dentures  Diet: She does eat meat. She consumes some fruits and veggies. She tries to avoid fried foods. She drinks mostly soda, coffee Exercise: None  Review of Systems     Past Medical History:  Diagnosis Date   Anxiety    Aortic atherosclerosis (HCC)    Arthritis    Asthma    Back pain    Bladder cancer (HCC)    Bladder tumor    COPD (chronic obstructive pulmonary disease) (HCC)    Depression    GERD (gastroesophageal reflux disease)    Headache    History of COVID-19    Hyperlipidemia    Hypertension    Neuromuscular disorder (HCC)    Sleep apnea    Tendonitis    Type 2 diabetes mellitus without complication (HCC)    Urinary tract infection due to extended-spectrum beta lactamase (ESBL) producing Escherichia coli 05/22/2022    Current Outpatient Medications  Medication Sig Dispense Refill   albuterol  (VENTOLIN  HFA) 108 (90 Base) MCG/ACT inhaler INHALE 1-2 PUFFS BY MOUTH EVERY 6 HOURS AS NEEDED FOR WHEEZE OR SHORTNESS OF BREATH 8.5 each 2   aspirin  EC 81 MG tablet Take 1 tablet (81 mg total) by mouth daily. Swallow whole. 30 tablet 12   Aspirin -Salicylamide-Caffeine (BC HEADACHE POWDER PO) Take 4 packets by mouth daily as needed (pain).     atorvastatin  (LIPITOR) 80 MG tablet Take 80 mg by mouth at bedtime.     Blood Glucose Monitoring Suppl (ONETOUCH VERIO FLEX SYSTEM) w/Device KIT Use to check blood sugar daily.  DX E11.9 1 kit 1   clonazePAM  (KLONOPIN ) 0.5 MG tablet TAKE 1 TABLET BY MOUTH EVERY DAY AS NEEDED FOR ANXIETY 30 tablet 0   DULoxetine (CYMBALTA) 30 MG capsule Take 30 mg by mouth at bedtime.      fluticasone  (FLONASE ) 50 MCG/ACT nasal spray SPRAY 2 SPRAYS INTO EACH NOSTRIL EVERY DAY 48 mL 0   Fluticasone -Umeclidin-Vilant (TRELEGY ELLIPTA ) 100-62.5-25 MCG/ACT AEPB Inhale 1 puff into the lungs daily. 60 each 6   furosemide  (LASIX ) 40 MG tablet TAKE 1 TABLET BY MOUTH EVERY DAY 90 tablet 0   gabapentin  (NEURONTIN ) 300 MG capsule Take 300 mg by mouth at bedtime.     glipiZIDE  (GLUCOTROL  XL) 5 MG 24 hr tablet Take 5 mg by mouth daily with breakfast.     glucose blood test strip Use as instructed 100 each 0   JARDIANCE 25 MG TABS tablet Take 25 mg by mouth every morning.     Lancets (ONETOUCH ULTRASOFT) lancets Use as instructed 100 each 12   lisinopril (ZESTRIL) 5 MG tablet Take 5 mg by mouth every morning.     metFORMIN  (GLUCOPHAGE ) 1000 MG tablet TAKE 1 TABLET (1,000 MG TOTAL) BY MOUTH 2 (TWO) TIMES DAILY WITH A MEAL. 60 tablet 0   ondansetron  (ZOFRAN ) 4 MG tablet Take 4 mg by mouth every 8 (eight) hours as needed for nausea.     oxybutynin  (DITROPAN  XL) 15 MG 24 hr tablet Take 1 tablet (15 mg total) by mouth daily. 90 tablet 3   OXYGEN  Inhale 1 L into the lungs at bedtime.  pantoprazole  (PROTONIX ) 40 MG tablet TAKE 1 TABLET (40 MG TOTAL) BY MOUTH DAILY AS NEEDED (HEARTBURN). 90 tablet 1   polyethylene glycol (MIRALAX ) 17 g packet Take 17 g by mouth daily. (Patient taking differently: Take 17 g by mouth daily as needed.) 14 each 0   Semaglutide, 2 MG/DOSE, 8 MG/3ML SOPN Inject 0.75 mLs into the skin once a week. Monday     No current facility-administered medications for this visit.    Allergies  Allergen Reactions   Penicillins Anaphylaxis    Has patient had a PCN reaction causing immediate rash, facial/tongue/throat swelling, SOB or lightheadedness with hypotension: Yes Has patient had a PCN reaction causing severe rash involving mucus membranes or skin necrosis: No Has patient had a PCN reaction that required hospitalization: No Has patient had a PCN reaction occurring within the  last 10 years: No If all of the above answers are NO, then may proceed with Cephalosporin use.   Throat swells   Victoza [Liraglutide]     Severe nausea   Vicodin [Hydrocodone -Acetaminophen ] Rash    And hives    Family History  Problem Relation Age of Onset   Diabetes Mother    Kidney disease Father    Diabetes Father    Dementia Father    Diabetes Sister    Diabetes Brother    Diabetes Brother    Colon cancer Neg Hx    Esophageal cancer Neg Hx    Stomach cancer Neg Hx    Rectal cancer Neg Hx     Social History   Socioeconomic History   Marital status: Divorced    Spouse name: Not on file   Number of children: 3   Years of education: Not on file   Highest education level: Not on file  Occupational History   Not on file  Tobacco Use   Smoking status: Every Day    Current packs/day: 1.00    Average packs/day: 1 pack/day for 41.0 years (41.0 ttl pk-yrs)    Types: Cigarettes    Start date: 1984    Passive exposure: Current   Smokeless tobacco: Never   Tobacco comments:    1PPD 06/10/2022  Vaping Use   Vaping status: Never Used  Substance and Sexual Activity   Alcohol use: No   Drug use: Yes    Types: Marijuana    Comment: States she smokes MJ when she does not have any Tramadol .   Sexual activity: Yes  Other Topics Concern   Not on file  Social History Narrative   ** Merged History Encounter ** In the process of getting SSD for depression, DM and neuropathy      Active smoker [~ 2p/day; cutting down]; no alcohol; lives in [place]; self. Used to work in [employer].    Social Drivers of Corporate Investment Banker Strain: Low Risk  (02/24/2023)   Received from [facility]   Overall Financial Resource Strain (CARDIA)    Difficulty of Paying Living Expenses: Not hard at all  Food Insecurity: No Food Insecurity (02/24/2023)   [facility] from [facility]   Hunger Vital Sign    Worried About Running Out of Food in the Last  Year: Never true    Ran Out of Food in the Last Year: Never true  Transportation Needs: No Transportation Needs (02/24/2023)   [facility] - Transportation    In the past 12 months, has lack of transportation kept you  from medical appointments or from getting medications?: No    Lack of Transportation (Non-Medical): No  Physical Activity: Insufficiently Active (02/07/2023)   Exercise Vital Sign    Days of Exercise per Week: 3 days    Minutes of Exercise per Session: 30 min  Stress: Stress Concern Present (02/07/2023)   [employer] of Occupational Health - Occupational Stress Questionnaire    Feeling of Stress : To some extent  Social Connections: Socially Isolated (02/07/2023)   Social Connection and Isolation Panel [NHANES]    Frequency of Communication with Friends and Family: More than three times a week    Frequency of Social Gatherings with Friends and Family: Once a week    Attends Religious Services: Never    Database Administrator or Organizations: No    Attends Banker Meetings: Never    Marital Status: Divorced  Catering Manager Violence: Not At Risk (02/07/2023)   Humiliation, Afraid, Rape, and Kick questionnaire    Fear of Current or Ex-Partner: No    Emotionally Abused: No    Physically Abused: No    Sexually Abused: No     Constitutional: Denies fever, malaise, fatigue, headache or abrupt weight changes.  HEENT: Denies eye pain, eye redness, ear pain, ringing in the ears, wax buildup, runny nose, nasal congestion, bloody nose, or sore throat. Respiratory: Denies difficulty breathing, shortness of breath, cough or sputum production.   Cardiovascular: Denies chest pain, chest tightness, palpitations or swelling in the hands or feet.  Gastrointestinal: Denies abdominal pain, bloating, constipation, diarrhea or blood in the stool.  GU: Denies urgency, frequency, pain with urination, burning sensation, blood in  urine, odor or discharge. Musculoskeletal: Patient reports chronic joint and muscle pain.  Denies decrease in range of motion, difficulty with gait, or joint swelling.  Skin: Denies redness, rashes, lesions or ulcercations.  Neurological: Patient reports neuropathy.  Denies dizziness, difficulty with memory, difficulty with speech or problems with balance and coordination.  Psych: Patient has a history of anxiety and depression.  Denies SI/HI.  No other specific complaints in a complete review of systems (except as listed in HPI above).  Objective:   Physical Exam  BP (!) 90/50   Ht 5' 1 (1.549 m)   Wt 140 lb (63.5 kg)   LMP 12/01/2017   BMI 26.45 kg/m    Wt Readings from Last 3 Encounters:  03/19/23 141 lb (64 kg)  03/12/23 140 lb (63.5 kg)  02/26/23 140 lb (63.5 kg)    General: Appears her stated age, overweight, in NAD. Skin: Warm, dry and intact. No ulcerations noted. HEENT: Head: normal shape and size; Eyes: sclera white, no icterus, conjunctiva pink, PERRLA and EOMs intact;  Neck:  Neck supple, trachea midline. No masses, lumps or thyromegaly present.  Cardiovascular: Normal rate and rhythm. S1,S2 noted.  No murmur, rubs or gallops noted. No JVD or BLE edema. No carotid bruits noted. Pulmonary/Chest: Normal effort and dimished breath sounds. No respiratory distress. No wheezes, rales or ronchi noted.  Abdomen: Normal bowel sounds.  Musculoskeletal: Strength 5/5 BUE/BLE. No difficulty with gait.  Neurological: Alert and oriented. Cranial nerves II-XII grossly intact. Coordination normal.  Psychiatric: Mood and affect normal. Behavior is normal. Judgment and thought content normal.     BMET    Component Value Date/Time   NA 140 12/31/2022 1433   NA 139 06/25/2017 1027   NA 134 (L) 12/07/2012 1809   K 3.8 12/31/2022 1433   K 3.4 (L) 12/07/2012 1809  CL 102 12/31/2022 1433   CL 103 12/07/2012 1809   CO2 29 12/31/2022 1433   CO2 25 12/07/2012 1809   GLUCOSE 95  12/31/2022 1433   GLUCOSE 274 (H) 12/07/2012 1809   BUN 17 12/31/2022 1433   BUN 10 06/25/2017 1027   BUN 10 12/07/2012 1809   CREATININE 1.00 12/31/2022 1433   CREATININE 0.71 03/14/2022 1336   CALCIUM  9.4 12/31/2022 1433   CALCIUM  8.6 12/07/2012 1809   GFRNONAA >60 12/31/2022 1433   GFRNONAA 84 09/07/2019 1019   GFRAA 97 09/07/2019 1019    Lipid Panel     Component Value Date/Time   CHOL 170 03/14/2022 1336   CHOL 120 12/24/2016 1827   TRIG 132 03/14/2022 1336   HDL 44 (L) 03/14/2022 1336   HDL 29 (L) 12/24/2016 1827   CHOLHDL 3.9 03/14/2022 1336   LDLCALC 103 (H) 03/14/2022 1336    CBC    Component Value Date/Time   WBC 9.2 12/31/2022 1433   RBC 3.98 12/31/2022 1433   HGB 11.9 (L) 12/31/2022 1433   HGB 12.8 06/25/2017 1027   HCT 36.0 12/31/2022 1433   HCT 38.3 06/25/2017 1027   PLT 292 12/31/2022 1433   PLT 348 06/25/2017 1027   MCV 90.5 12/31/2022 1433   MCV 90 06/25/2017 1027   MCV 80 12/07/2012 1809   MCH 29.9 12/31/2022 1433   MCHC 33.1 12/31/2022 1433   RDW 15.1 12/31/2022 1433   RDW 15.0 06/25/2017 1027   RDW 17.8 (H) 12/07/2012 1809   LYMPHSABS 1.3 06/12/2022 0516   LYMPHSABS 3.3 (H) 09/11/2016 1131   MONOABS 0.8 06/12/2022 0516   EOSABS 0.2 06/12/2022 0516   EOSABS 0.2 09/11/2016 1131   BASOSABS 0.0 06/12/2022 0516   BASOSABS 0.0 09/11/2016 1131    Hgb A1C Lab Results  Component Value Date   HGBA1C 7.6 08/01/2022            Assessment & Plan:   Preventative Health Maintenance:  Flu shot UTD She declines tetanus for financial reasons, advised if she gets bit or cut to go get this done Encouraged her to get a COVID booster Pneumovax  UTD Discussed Shingrix vaccine, she will check coverage with her insurance company and schedule a nurse visit if she would like to have this done Pap smear UTD  Mammogram ordered-she will call to schedule Referral to GI for colon cancer screening CT lung cancer screening scheduled Encouraged her to  consume a balanced diet and exercise regimen Advised her to see an eye doctor and dentist annually Recent labs reviewed  RTC in 6 months, follow-up chronic conditions Angeline Laura, NP

## 2023-04-03 NOTE — Assessment & Plan Note (Signed)
 Encourage diet and exercise for weight loss

## 2023-04-07 NOTE — Progress Notes (Deleted)
 BCG Bladder Instillation  BCG # 6/6  Due to Bladder Cancer patient is present today for a BCG treatment. Patient was cleaned and prepped in a sterile fashion with betadine. A 14 FR catheter was inserted, urine return was noted ***ml, urine was *** in color.  50ml of reconstituted BCG was instilled into the bladder. The catheter was then removed. Patient tolerated well, no complications were noted  Performed by: CLOTILDA CORNWALL, PA-C and ***  Follow up/ Additional notes: Return for cysto with Dr. Francisca in four months

## 2023-04-08 ENCOUNTER — Ambulatory Visit: Payer: 59 | Admitting: Urology

## 2023-04-08 DIAGNOSIS — C672 Malignant neoplasm of lateral wall of bladder: Secondary | ICD-10-CM

## 2023-04-09 ENCOUNTER — Ambulatory Visit (INDEPENDENT_AMBULATORY_CARE_PROVIDER_SITE_OTHER): Payer: 59 | Admitting: Urology

## 2023-04-09 ENCOUNTER — Encounter: Payer: Self-pay | Admitting: Urology

## 2023-04-09 VITALS — BP 119/72 | HR 87 | Ht 61.0 in | Wt 140.0 lb

## 2023-04-09 DIAGNOSIS — D494 Neoplasm of unspecified behavior of bladder: Secondary | ICD-10-CM

## 2023-04-09 DIAGNOSIS — C679 Malignant neoplasm of bladder, unspecified: Secondary | ICD-10-CM

## 2023-04-09 DIAGNOSIS — N3281 Overactive bladder: Secondary | ICD-10-CM

## 2023-04-09 MED ORDER — BCG LIVE 50 MG IS SUSR
3.2400 mL | Freq: Once | INTRAVESICAL | Status: AC
  Administered 2023-04-09: 81 mg via INTRAVESICAL

## 2023-04-09 NOTE — Progress Notes (Signed)
 BCG Bladder Instillation  BCG # 6/6  Due to Bladder Cancer patient is present today for a BCG treatment. Patient was cleaned and prepped in a sterile fashion with betadine. A 14 FR catheter was inserted, urine return was noted 100 ml, urine was yellow in color.  50ml of reconstituted BCG was instilled into the bladder. The catheter was then removed. Patient tolerated well, no complications were noted  Performed by: CLOTILDA CORNWALL, PA-C and Judge Sharps, CMA   Follow up/ Additional notes: Return for cysto with Dr. Francisca in 6 weeks

## 2023-04-10 LAB — URINALYSIS, COMPLETE
Bilirubin, UA: NEGATIVE
Glucose, UA: NEGATIVE
Ketones, UA: NEGATIVE
Nitrite, UA: POSITIVE — AB
Protein,UA: NEGATIVE
RBC, UA: NEGATIVE
Specific Gravity, UA: 1.025 (ref 1.005–1.030)
Urobilinogen, Ur: 0.2 mg/dL (ref 0.2–1.0)
pH, UA: 5.5 (ref 5.0–7.5)

## 2023-04-10 LAB — MICROSCOPIC EXAMINATION: RBC, Urine: NONE SEEN /[HPF] (ref 0–2)

## 2023-04-16 ENCOUNTER — Other Ambulatory Visit: Payer: Self-pay | Admitting: Internal Medicine

## 2023-04-17 NOTE — Telephone Encounter (Signed)
Requested medication (s) are due for refill today: yes  Requested medication (s) are on the active medication list: yes  Last refill:  03/07/23  Future visit scheduled: yes  Notes to clinic:  Unable to refill per protocol, cannot delegate.      Requested Prescriptions  Pending Prescriptions Disp Refills   clonazePAM (KLONOPIN) 0.5 MG tablet [Pharmacy Med Name: CLONAZEPAM 0.5 MG TABLET] 30 tablet 0    Sig: TAKE 1 TABLET BY MOUTH EVERY DAY AS NEEDED FOR ANXIETY     Not Delegated - Psychiatry: Anxiolytics/Hypnotics 2 Failed - 04/17/2023  8:52 AM      Failed - This refill cannot be delegated      Failed - Urine Drug Screen completed in last 360 days      Passed - Patient is not pregnant      Passed - Valid encounter within last 6 months    Recent Outpatient Visits           2 weeks ago Encounter for general adult medical examination with abnormal findings   [place] [facility] [place], Salvadore Oxford, NP   4 weeks ago Viral URI with cough   [place] [facility] [place], [place] W, NP   5 months ago Hyperlipidemia associated with type 2 diabetes mellitus [facility])   [place] [facility] [place], Gentry Fitz A, RPH-CPP   7 months ago Aortic atherosclerosis [facility])   [place] [facility] [place], [place] W, NP   8 months ago Type 2 diabetes mellitus with hyperglycemia, without long-term current use of insulin [facility])   [place] [facility] [place], Jackelyn Poling, RPH-CPP       Future Appointments             In 5 months [name], Salvadore Oxford, NP [place] [facility], [facility]

## 2023-05-15 ENCOUNTER — Other Ambulatory Visit: Payer: Self-pay | Admitting: Internal Medicine

## 2023-05-15 NOTE — Telephone Encounter (Signed)
Requested Prescriptions  Pending Prescriptions Disp Refills   fluticasone (FLONASE) 50 MCG/ACT nasal spray [Pharmacy Med Name: FLUTICASONE PROP 50 MCG SPRAY] 48 mL 0    Sig: SPRAY 2 SPRAYS INTO EACH NOSTRIL EVERY DAY     Ear, Nose, and Throat: Nasal Preparations - Corticosteroids Passed - 05/15/2023  1:40 PM      Passed - Valid encounter within last 12 months    Recent Outpatient Visits           1 month ago Encounter for general adult medical examination with abnormal findings   [facility] [place], Salvadore Oxford, NP   1 month ago Viral URI with cough   [place] [facility] [place], [place] W, NP   6 months ago Hyperlipidemia associated with type 2 diabetes mellitus [facility])   [place] [facility] [place], Gentry Fitz A, RPH-CPP   8 months ago Aortic atherosclerosis [facility])   [place] [facility] [place], [place] W, NP   8 months ago Type 2 diabetes mellitus with hyperglycemia, without long-term current use of insulin [facility])   [place] [facility] [place], Jackelyn Poling, RPH-CPP       Future Appointments             In 4 months [name], Salvadore Oxford, NP [place] [facility], [facility]

## 2023-05-16 ENCOUNTER — Other Ambulatory Visit: Payer: Self-pay | Admitting: Internal Medicine

## 2023-05-19 ENCOUNTER — Other Ambulatory Visit: Payer: 59 | Admitting: Pharmacist

## 2023-05-19 DIAGNOSIS — Z7985 Long-term (current) use of injectable non-insulin antidiabetic drugs: Secondary | ICD-10-CM

## 2023-05-19 DIAGNOSIS — Z7984 Long term (current) use of oral hypoglycemic drugs: Secondary | ICD-10-CM

## 2023-05-19 DIAGNOSIS — E1142 Type 2 diabetes mellitus with diabetic polyneuropathy: Secondary | ICD-10-CM

## 2023-05-19 DIAGNOSIS — J449 Chronic obstructive pulmonary disease, unspecified: Secondary | ICD-10-CM

## 2023-05-19 DIAGNOSIS — Z794 Long term (current) use of insulin: Secondary | ICD-10-CM

## 2023-05-19 NOTE — Patient Instructions (Signed)
Goals Addressed             This Visit's Progress    Pharmacy Goals       Our goal A1c is less than 7%. This corresponds with fasting sugars less than 130 and 2 hour after meal sugars less than 180. Please check your blood sugar each morning  Our goal bad cholesterol, or LDL, is less than 70 . This is why it is important to continue taking your atorvastatin  Please follow up with the [place] Quitline for their help with quitting smoking.  The [place] Quitline phone number is: 1-800-784-8669  Feel free to call me with any questions or concerns. I look forward to our next call!  Elisabeth Delles, PharmD, BCACP Clinical Pharmacist [facility] [place] [phone]        

## 2023-05-19 NOTE — Progress Notes (Signed)
05/19/2023 Name: Brandi Calderon MRN: [id number] DOB: Aug 14, 1965  Chief Complaint  Patient presents with   Medication Management   Medication Adherence    Brandi Calderon is a 58 y.o. year old female who presented for a telephone visit.   They were referred to the pharmacist by their PCP for assistance in managing diabetes, hypertension, and hyperlipidemia.      Subjective:   Care Team: Primary Care Provider: Lorre Munroe, NP; Next Scheduled Visit: 10/07/2023 Pulmonologist: [facility]; Next Scheduled Visit: 06/13/2023 Endocrinologist: Dr. Tedd Sias; Next Scheduled Visit: 06/24/2023  Urologist: [name], MD; Next Scheduled Visit: 05/20/2023  Medication Access/Adherence  Current Pharmacy:  [id number] - [place], [place] - [address] [place] [address] Phone: [phone] Fax: [phone]   Patient reports affordability concerns with their medications: No  Patient reports access/transportation concerns to their pharmacy: No  Patient reports adherence concerns with their medications:  No      Patient currently using weekly pillbox to organize her medications  Reports working on drinking more water; staying well hydrated    Diabetes: Patient followed by [facility] Endocrinology for management of T2DM   Current medications:  Metformin 1000 mg twice daily Ozempic 2 mg weekly on Mondays  Reports tolerating; taking ondansetron 4 mg on day of Ozempic injection as directed by Endocrinologist Jardiance 25 mg daily glipizide ER 5 mg daily  Medications tried in the past: Trulicity   Recalls recent morning fasting blood sugars ranging 120-168; today: 168    Denies symptoms of hypoglycemia - Carries glucose tablets   Statin: atorvastatin 80 mg daily   ACE-I: lisinopril 5 mg daily    COPD:   Current medications: Trelegy - 1 puff daily Albuterol - 1-2 puffs every 6 hours as needed Overnight oxygen   Reports using  Trelegy consistently and rinsing mouth after each use and using rescue (albuterol) inhaler as needed as directed     Tobacco Abuse: Reports currently smoking ~22 cigarettes/day Reports family supporting her with plan to quit smoking Triggers: boredom, others smoking around her, stress Planning to restart nicotine 21 mg patch daily to work on reducing number of cigarettes that she is smoking/day   Objective:  Lab Results  Component Value Date   HGBA1C 7.1 (A) 04/03/2023    Lab Results  Component Value Date   CREATININE 0.8 02/24/2023   BUN 17 12/31/2022   NA 140 12/31/2022   K 3.8 12/31/2022   CL 102 12/31/2022   CO2 29 12/31/2022    Lab Results  Component Value Date   CHOL 170 03/14/2022   HDL 44 (L) 03/14/2022   LDLCALC 126 02/24/2023   TRIG 119 02/24/2023   CHOLHDL 3.9 03/14/2022   BP Readings from Last 3 Encounters:  04/09/23 119/72  04/03/23 (!) 90/50  04/01/23 117/71   Pulse Readings from Last 3 Encounters:  04/09/23 87  04/01/23 81  03/19/23 73     Medications Reviewed Today     Reviewed by Manuela Neptune, RPH-CPP (Pharmacist) on 05/19/23 at 1550  Med List Status: <None>   Medication Order Taking? Sig Documenting Provider Last Dose Status Informant  albuterol (VENTOLIN HFA) 108 (90 Base) MCG/ACT inhaler [id number]  INHALE 1-2 PUFFS BY MOUTH EVERY 6 HOURS AS NEEDED FOR WHEEZE OR SHORTNESS OF BREATH Lorre Munroe, NP  Active   aspirin EC 81 MG tablet [id number]  Take 1 tablet (81 mg total) by mouth daily. Swallow whole. Nicki Reaper  W, NP  Active   Aspirin-Salicylamide-Caffeine (BC HEADACHE POWDER PO) [id number]  Take 4 packets by mouth daily as needed (pain). [provider]  Active Self  atorvastatin (LIPITOR) 80 MG tablet [id number] Yes Take 80 mg by mouth at bedtime. [provider] Taking Active   Blood Glucose Monitoring Suppl (ONETOUCH VERIO FLEX SYSTEM) w/Device KIT [id number]  Use to check blood sugar daily.  DX E11.9 Lorre Munroe, NP  Active   clonazePAM (KLONOPIN) 0.5 MG tablet [id number]  TAKE 1 TABLET BY MOUTH EVERY DAY AS NEEDED FOR ANXIETY [name], Salvadore Oxford, NP  Active   DULoxetine (CYMBALTA) 30 MG capsule [id number]  Take 30 mg by mouth at bedtime. [provider]  Active   fluticasone (FLONASE) 50 MCG/ACT nasal spray [id number]  SPRAY 2 SPRAYS INTO EACH NOSTRIL EVERY DAY [name], Salvadore Oxford, NP  Active   Fluticasone-Umeclidin-Vilant (TRELEGY ELLIPTA) 100-62.5-25 MCG/ACT AEPB [id number] Yes Inhale 1 puff into the lungs daily. Noemi Chapel, NP Taking Active   furosemide (LASIX) 40 MG tablet [id number]  TAKE 1 TABLET BY MOUTH EVERY DAY [name], Salvadore Oxford, NP  Active   gabapentin (NEURONTIN) 300 MG capsule [id number]  Take 300 mg by mouth at bedtime. [provider]  Active Self  glipiZIDE (GLUCOTROL XL) 5 MG 24 hr tablet [id number] Yes Take 5 mg by mouth daily with breakfast. [provider] Taking Active Self  glucose blood test strip [id number]  Use as instructed Lorre Munroe, NP  Active Self  JARDIANCE 25 MG TABS tablet [id number] Yes Take 25 mg by mouth every morning. [provider] Taking Active Self  Lancets Letta Pate ULTRASOFT) lancets [id number]  Use as instructed Lorre Munroe, NP  Active Self  lisinopril (ZESTRIL) 5 MG tablet [id number] Yes Take 5 mg by mouth every morning. [provider] Taking Active   metFORMIN (GLUCOPHAGE) 1000 MG tablet [id number] Yes TAKE 1 TABLET (1,000 MG TOTAL) BY MOUTH 2 (TWO) TIMES DAILY WITH A MEAL. Lorre Munroe, NP Taking Active Self  ondansetron (ZOFRAN) 4 MG tablet [id number]  Take 4 mg by mouth every 8 (eight) hours as needed for nausea. [provider]  Active   oxybutynin (DITROPAN XL) 15 MG 24 hr tablet [id number]  Take 1 tablet (15 mg total) by mouth daily. [name], MD  Active   OXYGEN [id number]  Inhale 1 L into the lungs at bedtime. [provider]  Active Self  pantoprazole (PROTONIX) 40 MG tablet  [id number]  TAKE 1 TABLET (40 MG TOTAL) BY MOUTH DAILY AS NEEDED (HEARTBURN). Lorre Munroe, NP  Active   polyethylene glycol (MIRALAX) 17 g packet [id number]  Take 17 g by mouth daily.  Patient taking differently: Take 17 g by mouth daily as needed.   Georga Hacking, MD  Active   Semaglutide, 2 MG/DOSE, 8 MG/3ML Namon Cirri [id number] Yes Inject 2 mg into the skin once a week. Monday [provider] Taking Active   Med List Note Valerie Salts, RN 06/25/18 4268): UDS 06-25-2018              Assessment/Plan:   Advise patient to work on staying hydrated with water, rather than sugary beverages.    Diabetes: - Counseled patient on importance of having regular well-balanced meals, while controlling carbohydrate portion sizes Encouraged patient to reduce intake of sugary beverages, to work on drinking water, rather than soda Encourage patient to check nutrition labels for total carbohydrate content of foods - Patient to monitor  home blood sugar in morning before breakfast and at bedtime, keep log of results and have this to review at medical appointments and to call Endocrinologist sooner for reading outside of established parameters or symptoms    COPD: - Recommend to continue using Trelegy inhaler consistently every day    Tobacco Abuse - Have encouraged patient in plan to restart nicotine replacement to aid with quitting - Have provided information on 1 800 QUIT NOW support program       Follow Up Plan: Clinic Pharmacist will follow up with patient again on 07/28/2023 at 2:00 PM    Estelle Grumbles, PharmD, [facility] Clinical Pharmacist [facility] [phone]

## 2023-05-19 NOTE — Telephone Encounter (Signed)
Requested medication (s) are due for refill today: yes  Requested medication (s) are on the active medication list: yes  Last refill:  04/17/23  Future visit scheduled: no  Notes to clinic:  Unable to refill per protocol, cannot delegate.      Requested Prescriptions  Pending Prescriptions Disp Refills   clonazePAM (KLONOPIN) 0.5 MG tablet [Pharmacy Med Name: CLONAZEPAM 0.5 MG TABLET] 30 tablet 0    Sig: TAKE 1 TABLET BY MOUTH EVERY DAY AS NEEDED FOR ANXIETY     Not Delegated - Psychiatry: Anxiolytics/Hypnotics 2 Failed - 05/19/2023 10:01 AM      Failed - This refill cannot be delegated      Failed - Urine Drug Screen completed in last 360 days      Passed - Patient is not pregnant      Passed - Valid encounter within last 6 months    Recent Outpatient Visits           1 month ago Encounter for general adult medical examination with abnormal findings   [place] [facility] [place], Salvadore Oxford, NP   2 months ago Viral URI with cough   [facility] [place], [place] W, NP   6 months ago Hyperlipidemia associated with type 2 diabetes mellitus [facility])   [place] [facility] Delles, Gentry Fitz A, RPH-CPP   8 months ago Aortic atherosclerosis [facility])   [place] [facility] [place], [place] W, NP   9 months ago Type 2 diabetes mellitus with hyperglycemia, without long-term current use of insulin [facility])   [place] [facility] Delles, Jackelyn Poling, RPH-CPP       Future Appointments             In 4 months [name], Salvadore Oxford, NP [place] [facility], [facility]

## 2023-05-20 ENCOUNTER — Encounter: Payer: Self-pay | Admitting: Urology

## 2023-05-20 ENCOUNTER — Ambulatory Visit (INDEPENDENT_AMBULATORY_CARE_PROVIDER_SITE_OTHER): Payer: 59 | Admitting: Urology

## 2023-05-20 ENCOUNTER — Other Ambulatory Visit: Payer: Medicare HMO | Admitting: Urology

## 2023-05-20 VITALS — BP 127/73 | HR 91 | Ht 61.0 in | Wt 140.0 lb

## 2023-05-20 DIAGNOSIS — C679 Malignant neoplasm of bladder, unspecified: Secondary | ICD-10-CM

## 2023-05-20 DIAGNOSIS — Z8551 Personal history of malignant neoplasm of bladder: Secondary | ICD-10-CM | POA: Diagnosis not present

## 2023-05-20 NOTE — Progress Notes (Signed)
Bladder cancer surveillance note   INDICATION Non-muscle invasive bladder cancer   UROLOGIC HISTORY   Brandi Calderon is a 58 y.o. female who originally presented with gross hematuria and on CT was found to have extensive papillary tumor in the bladder, with 2 large tumors measuring 5 cm and 7 cm respectively.   Initial Diagnosis of Bladder  Year: 06/07/2022 Pathology: HG T1   Treatments for Bladder Cancer 06/07/2022: TURBT for extensive tumor 5 cm and 7 cm, HG T1 07/26/2022: Second look TURBT, no residual tumor, pathology benign   08/2022: Induction BCG x 6  10:2024: Numerous small recurrences, largest 3cm 01/03/2023: TURBT w/gemcitabine (HG Ta)   AUA Risk Category High   Cystoscopy Procedure Note:   After informed consent and discussion of the procedure and its risks, Brandi Calderon was positioned and prepped in the standard fashion. Cystoscopy was performed with the a flexible cystoscope. The urethra, bladder neck and entire bladder was visualized in a standard fashion.  Bladder mucosa grossly normal throughout, no evidence of recurrence   She continues to smoke, we again discussed the importance of smoking cessation and extensive patient information provided   Continue maintenance BCG x 3 years, next March 2025, continue cystoscopy every 3 months If recurrence of disease may need to consider early cystectomy, we reviewed the AUA guidelines today regarding NMIBC  Legrand Rams, MD 05/20/2023

## 2023-05-22 ENCOUNTER — Ambulatory Visit: Payer: 59

## 2023-05-26 ENCOUNTER — Ambulatory Visit: Payer: Self-pay | Admitting: *Deleted

## 2023-05-26 ENCOUNTER — Telehealth: Payer: Self-pay | Admitting: Pharmacy Technician

## 2023-05-26 NOTE — Telephone Encounter (Signed)
 Auth Submission: NO AUTH NEEDED Site of care: [facility] Payer: [employer]HC DUAL Medication & CPT/J Code(s) submitted: BCG J9030  Route of submission (phone, fax, portal): PORTAL Phone # Fax # Auth type: Buy/Bill PB Units/visits requested: [facility] X6 DOSES Reference number: [id number] Approval from: 05/26/23 to 03/31/24

## 2023-05-26 NOTE — Patient Outreach (Signed)
 Care Coordination   Follow Up Visit Note   05/26/2023 Name: Brandi Calderon MRN: [id number] DOB: Jan 09, 1966  Brandi Calderon is a 58 y.o. year old female who sees Brandi Calderon, Brandi Oxford, NP for primary care. I spoke with  Brandi Calderon by phone today.  What matters to the patients health and wellness today?  Patient doing well, bladder cancer treatments have been on hold, but will restart in 2 weeks.  Denies any urgent concerns.     Goals Addressed             This Visit's Progress    Effective treatment for bladder cancer   On track    Interventions Today    Flowsheet Row Most Recent Value  Chronic Disease   Chronic disease during today's visit Diabetes, Other  [bladder cancer]  General Interventions   General Interventions Discussed/Reviewed General Interventions Reviewed, Doctor Visits, Labs  Labs Hgb A1c every 3 months  [most recent 7.1]  Doctor Visits Discussed/Reviewed Doctor Visits Reviewed, Specialist  [Will restart bladder treatments on 3/12, 3/19, and 3/26. Pulmonary 3/14, endocrinology 3/25]  PCP/Specialist Visits Compliance with follow-up visit  Education Interventions   Education Provided Provided Education  Provided Verbal Education On Other, Medication, When to see the doctor, Blood Sugar Monitoring  [Meds reviewed, will call to get refill for Ozempic. Blood sugars range 150-160s.  Denies any issues with urination (no pain or bleeding)]           Management of DM   On track    Care Coordination Interventions: Provided education to patient about basic DM disease process Reviewed medications with patient and discussed importance of medication adherence Counseled on importance of regular laboratory monitoring as prescribed Discussed plans with patient for ongoing care management follow up and provided patient with direct contact information for care management team Provided patient with written educational materials related to hypo and hyperglycemia and  importance of correct treatment         SDOH assessments and interventions completed:  No     Care Coordination Interventions:  Yes, provided   Follow up plan: Follow up call scheduled for 3/28    Encounter Outcome:  Patient Visit Completed   Rodney Langton, RN, MSN, CCM [place]  [facility], [facility] RN Care Coordinator Direct Dial: [phone] / Main [phone] Fax [phone] Email: [email or web address]: [email or web address]

## 2023-05-26 NOTE — Patient Outreach (Signed)
 Care Coordination   05/26/2023 Name: Brandi Calderon MRN: [id number] DOB: 02-11-1966   Care Coordination Outreach Attempts:  An unsuccessful outreach was attempted for an appointment today.  Follow Up Plan:  Additional outreach attempts will be made to offer the patient complex care management information and services.   Encounter Outcome:  No Answer   Care Coordination Interventions:  No, not indicated    Rodney Langton, RN, MSN, CCM [place]  [facility], [facility] RN Care Coordinator Direct Dial: [phone] / Main [phone] Fax [phone] Email: [email or web address]: [email or web address]

## 2023-06-05 ENCOUNTER — Other Ambulatory Visit: Payer: Self-pay | Admitting: Internal Medicine

## 2023-06-05 NOTE — Telephone Encounter (Signed)
 Requested medication (s) are due for refill today -early  Requested medication (s) are on the active medication list -yes  Future visit scheduled -yes  Last refill: 05/19/23 #30  Notes to clinic: non delegated Rx  Requested Prescriptions  Pending Prescriptions Disp Refills   clonazePAM (KLONOPIN) 0.5 MG tablet [Pharmacy Med Name: CLONAZEPAM 0.5 MG TABLET] 30 tablet 0    Sig: TAKE 1 TABLET BY MOUTH EVERY DAY AS NEEDED FOR ANXIETY     Not Delegated - Psychiatry: Anxiolytics/Hypnotics 2 Failed - 06/05/2023 10:41 AM      Failed - This refill cannot be delegated      Failed - Urine Drug Screen completed in last 360 days      Passed - Patient is not pregnant      Passed - Valid encounter within last 6 months    Recent Outpatient Visits           2 months ago Encounter for general adult medical examination with abnormal findings   [place] [facility] [place], Salvadore Oxford, NP   2 months ago Viral URI with cough   [place] [facility] [place], [place] W, NP   6 months ago Hyperlipidemia associated with type 2 diabetes mellitus [facility])   [place] [facility] Delles, Gentry Fitz A, RPH-CPP   8 months ago Aortic atherosclerosis [facility])   [place] [facility] [place], [place] W, NP   9 months ago Type 2 diabetes mellitus with hyperglycemia, without long-term current use of insulin [facility])   [place] [facility] Delles, Jackelyn Poling, RPH-CPP       Future Appointments             In 6 days [place], [place] [facility] [place]   In 1 week [place], [place] Urology [place]   In 2 weeks [place], [place]ana [facility] [place]   In 4 months [place], Salvadore Oxford, NP [place] [facility], [facility]               Requested Prescriptions  Pending Prescriptions Disp Refills   clonazePAM (KLONOPIN) 0.5 MG tablet [Pharmacy Med Name: CLONAZEPAM 0.5  MG TABLET] 30 tablet 0    Sig: TAKE 1 TABLET BY MOUTH EVERY DAY AS NEEDED FOR ANXIETY     Not Delegated - Psychiatry: Anxiolytics/Hypnotics 2 Failed - 06/05/2023 10:41 AM      Failed - This refill cannot be delegated      Failed - Urine Drug Screen completed in last 360 days      Passed - Patient is not pregnant      Passed - Valid encounter within last 6 months    Recent Outpatient Visits           2 months ago Encounter for general adult medical examination with abnormal findings   [place] [facility] [place], Salvadore Oxford, NP   2 months ago Viral URI with cough   [place] [facility] [place], [place] W, NP   6 months ago Hyperlipidemia associated with type 2 diabetes mellitus [facility])   [place] [facility] Delles, Gentry Fitz A, RPH-CPP   8 months ago Aortic atherosclerosis [facility])   [place] [facility] [place], [place] W, NP   9 months ago Type 2 diabetes mellitus with hyperglycemia, without long-term current use of insulin (HCC)     [place] Delles, Jackelyn Poling, RPH-CPP       Future Appointments             In 6 days [place], [facility]ana [facility] Urology [place]   In 1 week [place], [place] [place]   In 2 weeks [place], [place] [place]   In 4 months [place], Salvadore Oxford, NP [facility], [facility]

## 2023-06-10 NOTE — Progress Notes (Unsigned)
 BCG Bladder Instillation  BCG # 1/3  Due to Bladder Cancer patient is present today for a BCG treatment. Patient was cleaned and prepped in a sterile fashion with betadine. A 14 FR catheter was inserted, urine return was noted 20 ml, urine was yellow in color.  50ml of reconstituted BCG was instilled into the bladder. The catheter was then removed. Patient tolerated well, no complications were noted  Performed by: Michiel Cowboy, PA-C and Benay Pike, CMA   Follow up/ Additional notes: She stated it was little more uncomfortable for her 1 week catheter, so I will send her urine for culture.  We will contact her when the culture results are available to see if she is having symptoms of a UTI and if so we will prescribe an antibiotic at that time.  We will see her in 1 week for number 2 out of 3 BCG.

## 2023-06-11 ENCOUNTER — Ambulatory Visit (INDEPENDENT_AMBULATORY_CARE_PROVIDER_SITE_OTHER): Payer: 59 | Admitting: Urology

## 2023-06-11 ENCOUNTER — Encounter: Payer: Self-pay | Admitting: Acute Care

## 2023-06-11 DIAGNOSIS — C679 Malignant neoplasm of bladder, unspecified: Secondary | ICD-10-CM

## 2023-06-11 DIAGNOSIS — R3129 Other microscopic hematuria: Secondary | ICD-10-CM

## 2023-06-11 DIAGNOSIS — D494 Neoplasm of unspecified behavior of bladder: Secondary | ICD-10-CM

## 2023-06-11 LAB — MICROSCOPIC EXAMINATION: Epithelial Cells (non renal): >10 /HPF — AB (ref 0–10)

## 2023-06-11 LAB — URINALYSIS, COMPLETE
Bilirubin, UA: NEGATIVE
Ketones, UA: NEGATIVE
Leukocytes,UA: NEGATIVE
Nitrite, UA: POSITIVE — AB
Protein,UA: NEGATIVE
Specific Gravity, UA: 1.015 (ref 1.005–1.030)
Urobilinogen, Ur: 0.2 mg/dL (ref 0.2–1.0)
pH, UA: 5.5 (ref 5.0–7.5)

## 2023-06-11 MED ORDER — BCG LIVE 50 MG IS SUSR
3.2400 mL | Freq: Once | INTRAVESICAL | Status: AC
  Administered 2023-06-11: 81 mg via INTRAVESICAL

## 2023-06-13 ENCOUNTER — Encounter: Payer: Self-pay | Admitting: Nurse Practitioner

## 2023-06-13 ENCOUNTER — Ambulatory Visit: Payer: Medicare HMO | Admitting: Nurse Practitioner

## 2023-06-13 VITALS — BP 120/60 | HR 62 | Temp 97.8°F | Ht 61.0 in | Wt 139.2 lb

## 2023-06-13 DIAGNOSIS — F17210 Nicotine dependence, cigarettes, uncomplicated: Secondary | ICD-10-CM

## 2023-06-13 DIAGNOSIS — C679 Malignant neoplasm of bladder, unspecified: Secondary | ICD-10-CM

## 2023-06-13 DIAGNOSIS — J9611 Chronic respiratory failure with hypoxia: Secondary | ICD-10-CM

## 2023-06-13 DIAGNOSIS — G4734 Idiopathic sleep related nonobstructive alveolar hypoventilation: Secondary | ICD-10-CM

## 2023-06-13 DIAGNOSIS — J449 Chronic obstructive pulmonary disease, unspecified: Secondary | ICD-10-CM | POA: Diagnosis not present

## 2023-06-13 DIAGNOSIS — F17200 Nicotine dependence, unspecified, uncomplicated: Secondary | ICD-10-CM

## 2023-06-13 DIAGNOSIS — G4733 Obstructive sleep apnea (adult) (pediatric): Secondary | ICD-10-CM | POA: Diagnosis not present

## 2023-06-13 MED ORDER — NICOTINE 21 MG/24HR TD PT24: 21.0000 mg | MEDICATED_PATCH | Freq: Every day | TRANSDERMAL | 2 refills | Status: DC

## 2023-06-13 NOTE — Assessment & Plan Note (Signed)
 Primarily nocturnal.  No exertional hypoxia on previous walk test 01/2023.  Encouraged to continue monitoring for goal greater than 80 to 90%.  Utilize 1 L/min at night.

## 2023-06-13 NOTE — Progress Notes (Signed)
 @Patient  ID: Brandi Calderon, female    DOB: [birth year], 58 y.o.   MRN: [id number]  Chief Complaint  Patient presents with   Follow-up    Cough, shortness of breath and wheezing.     Referring provider: Lorre Munroe, NP  HPI: 58 year old female, active smoker followed for COPD.  She is a patient of Dr. Clovis Fredrickson and last seen in office 02/25/2023 by [facility] NP.  Past medical history significant for atherosclerosis/CAD, GERD, HLD, diabetes, neuropathy, anxiety and depression, bladder cancer.  TEST/EVENTS:  10/04/2020 PFT: FVC 68, FEV1 49, ratio 55, TLC 81, DLCO 77.  Severe obstructive airway disease without reversibility 05/20/2022 LDCT lung cancer screening: Atherosclerosis/CAD.  Centrilobular emphysema.  Stable left upper lobe nodule, 4.3 mm.  Lung RADS 2.  06/10/2022: OV with Dr. Aundria Rud.  Confusion in scheduling and put on incorrect schedule.  Symptoms have been stable.  Compliant with inhalers.  Exam benign.  Continue on Trelegy.  Preprocedural restratification.  Consider low risk for postprocedural pulmonary complications/failure.  Smoking cessation again advised.  01/08/2023: OV with Milca Sytsma NP for follow-up.  Since she was here last, she had been treated for bladder cancer.  Unfortunately, after she concluded treatment her cancer has returned.  She had a transurethral resection of bladder tumor last week.  Still having some discomfort because of this but slowly improving.  Pathology did come back with cancer cells.  She is being referred back to oncology again.  She is understandably frustrated by this. From a breathing standpoint, she has been doing about the same.  She does get short winded and has a daily cough with clear phlegm.  She does notice some occasional wheezing.  She is currently using Trelegy.  Uses her albuterol a few times a day.  She denies any increased chest congestion, fevers, chills, night sweats, hemoptysis, anorexia, weight loss, lower extremity swelling.  She is still  smoking.  She has a lot of life stressors.  Her son died in 06-20-14 after being fatally shot.  After this, she was up to 4 packs a day.  She has been able to cut back and she is down to 1 pack/day now.  She is just dealing with her cancer diagnosis as well as the release of the man who murdered her son so does not feel like she can quit smoking at this point in time.  02/25/2023: Ov with Lakeshia Dohner NP for follow-up. Feeling about the same as our last visit.  She did try the [facility] but felt like this made her cough more and did not really notice any significant change from the Trelegy.  Back to using her Trelegy.   She did have a URI a few weeks ago.  Was seen at the emergency department.  COVID and flu testing were negative.  She was treated with supportive care measures.  Feels like she is back to her baseline since then.  Still has a high symptom burden with shortness of breath with minimal activity.  She does have a daily cough with clear phlegm, which is normal for her.  Has not noticed any increased wheezing.  Denies any fevers, chills, night sweats, hemoptysis, anorexia, weight loss.  Still smoking.  Has no desire to quit at this time.  Not using albuterol much.  She does use oxygen at night.  Trying to get full disability.  She is unable to complete any sort of distance walking without getting short of breath.  Cannot even do her grocery shopping without having  to stop multiple times.  06/13/2023: Today - follow up Patient presents today for follow-up.  At she had a home health nurse that came out to her house and had told her that she had sleep apnea and she should be on CPAP.  She has had a sleep study in the past, 2022.  Showed mild sleep apnea.  She has never been on CPAP therapy.  She does wear supplemental oxygen 1 L/min at night.  She does have snoring, which is usually position dependent.  She does have daytime sleepiness symptoms.  Does not feel like she sleeps all that well at night.  No issues with  drowsy driving, sleep parasomnia/paralysis, morning headaches. Regarding her breathing and feels stable.  Not had any flareups requiring steroids or antibiotics.  She does get short winded with minimal activity, which is baseline for her.  Does have a daily productive cough with clear phlegm.  No issues with increased chest congestion, wheezing.  No hemoptysis, anorexia or weight loss.  She is wanting to work on quitting smoking.  She has used nicotine patches in the past which has helped.  Willing to retry these. Has not had updated lung cancer screening CT; was due in February 2025 but she is actively being treated for bladder cancer.  She was told by her urologist that her recent scans look good and she seems to be responding well to treatment.  She has 3 treatments last and then we will move into surveillance.  Allergies  Allergen Reactions   Penicillins Anaphylaxis    Has patient had a PCN reaction causing immediate rash, facial/tongue/throat swelling, SOB or lightheadedness with hypotension: Yes Has patient had a PCN reaction causing severe rash involving mucus membranes or skin necrosis: No Has patient had a PCN reaction that required hospitalization: No Has patient had a PCN reaction occurring within the last 10 years: No If all of the above answers are "NO", then may proceed with Cephalosporin use.   Throat swells   Victoza [Liraglutide]     Severe nausea   Vicodin [Hydrocodone-Acetaminophen] Rash    And hives    Immunization History  Administered Date(s) Administered   Influenza Inj Mdck Quad Pf 02/07/2016   Influenza, Seasonal, Injecte, Preservative Fre 02/25/2023   Influenza,inj,Quad PF,6+ Mos 01/04/2019, 04/19/2021, 11/27/2021   Influenza-Unspecified 02/18/2017   PFIZER Comirnaty(Gray Top)Covid-19 Tri-Sucrose Vaccine 11/10/2019   Pneumococcal Polysaccharide-23 03/14/2022    Past Medical History:  Diagnosis Date   Anxiety    Aortic atherosclerosis (HCC)    Arthritis     Asthma    Back pain    Bladder cancer (HCC)    Bladder tumor    COPD (chronic obstructive pulmonary disease) (HCC)    Depression    GERD (gastroesophageal reflux disease)    Headache    History of COVID-19    Hyperlipidemia    Hypertension    Neuromuscular disorder (HCC)    Sleep apnea    Tendonitis    Type 2 diabetes mellitus without complication (HCC)    Urinary tract infection due to extended-spectrum beta lactamase (ESBL) producing Escherichia coli 05/22/2022    Tobacco History: Social History   Tobacco Use  Smoking Status Every Day   Current packs/day: 1.00   Average packs/day: 1 pack/day for 41.2 years (41.2 ttl pk-yrs)   Types: Cigarettes   Start date: 1984   Passive exposure: Current  Smokeless Tobacco Never  Tobacco Comments   1PPD 06/10/2022   Ready to quit: Not Answered Counseling given:  Not Answered Tobacco comments: 1PPD 06/10/2022   Outpatient Medications Prior to Visit  Medication Sig Dispense Refill   albuterol (VENTOLIN HFA) 108 (90 Base) MCG/ACT inhaler INHALE 1-2 PUFFS BY MOUTH EVERY 6 HOURS AS NEEDED FOR WHEEZE OR SHORTNESS OF BREATH 8.5 each 2   aspirin EC 81 MG tablet Take 1 tablet (81 mg total) by mouth daily. Swallow whole. 30 tablet 12   Aspirin-Salicylamide-Caffeine (BC HEADACHE POWDER PO) Take 4 packets by mouth daily as needed (pain).     atorvastatin (LIPITOR) 80 MG tablet Take 80 mg by mouth at bedtime.     Blood Glucose Monitoring Suppl (ONETOUCH VERIO FLEX SYSTEM) w/Device KIT Use to check blood sugar daily.  DX E11.9 1 kit 1   clonazePAM (KLONOPIN) 0.5 MG tablet TAKE 1 TABLET BY MOUTH EVERY DAY AS NEEDED FOR ANXIETY 30 tablet 0   DULoxetine (CYMBALTA) 30 MG capsule Take 30 mg by mouth at bedtime.     fluticasone (FLONASE) 50 MCG/ACT nasal spray SPRAY 2 SPRAYS INTO EACH NOSTRIL EVERY DAY 48 mL 0   Fluticasone-Umeclidin-Vilant (TRELEGY ELLIPTA) 100-62.5-25 MCG/ACT AEPB Inhale 1 puff into the lungs daily. 60 each 6   furosemide (LASIX) 40 MG  tablet TAKE 1 TABLET BY MOUTH EVERY DAY 90 tablet 0   gabapentin (NEURONTIN) 300 MG capsule Take 300 mg by mouth at bedtime.     glipiZIDE (GLUCOTROL XL) 5 MG 24 hr tablet Take 5 mg by mouth daily with breakfast.     glucose blood test strip Use as instructed 100 each 0   JARDIANCE 25 MG TABS tablet Take 25 mg by mouth every morning.     Lancets (ONETOUCH ULTRASOFT) lancets Use as instructed 100 each 12   lisinopril (ZESTRIL) 5 MG tablet Take 5 mg by mouth every morning.     metFORMIN (GLUCOPHAGE) 1000 MG tablet TAKE 1 TABLET (1,000 MG TOTAL) BY MOUTH 2 (TWO) TIMES DAILY WITH A MEAL. 60 tablet 0   ondansetron (ZOFRAN) 4 MG tablet Take 4 mg by mouth every 8 (eight) hours as needed for nausea.     oxybutynin (DITROPAN XL) 15 MG 24 hr tablet Take 1 tablet (15 mg total) by mouth daily. 90 tablet 3   OXYGEN Inhale 1 L into the lungs at bedtime.     pantoprazole (PROTONIX) 40 MG tablet TAKE 1 TABLET (40 MG TOTAL) BY MOUTH DAILY AS NEEDED (HEARTBURN). 90 tablet 1   polyethylene glycol (MIRALAX) 17 g packet Take 17 g by mouth daily. (Patient taking differently: Take 17 g by mouth daily as needed.) 14 each 0   Semaglutide, 2 MG/DOSE, 8 MG/3ML SOPN Inject 2 mg into the skin once a week. Monday     No facility-administered medications prior to visit.     Review of Systems:   Constitutional: No weight loss or gain, night sweats, fevers, chills,or lassitude. + Fatigue HEENT: No headaches, difficulty swallowing, tooth/dental problems, or sore throat. No sneezing, itching, ear ache, nasal congestion, or post nasal drip CV:  No chest pain, orthopnea, PND, swelling in lower extremities, anasarca, dizziness, palpitations, syncope Resp: +shortness of breath with exertion; daily productive cough; rare wheeze; snoring. No excess mucus or change in color of mucus.  No hemoptysis.  No chest wall deformity GI:  No heartburn, indigestion GU: No dysuria, change in color of urine, urgency or frequency,  hematuria. Skin: No rash, lesions, ulcerations MSK:  No joint pain or swelling.   Neuro: No dizziness or lightheadedness.  Psych: No depression or anxiety.  Mood stable. + Sleep disturbance    Physical Exam:  BP 120/60 (BP Location: Left Arm, Patient Position: Sitting, Cuff Size: Normal)   Pulse 62   Temp 97.8 F (36.6 C) (Temporal)   Ht 5\' 1"  (1.549 m)   Wt 139 lb 3.2 oz (63.1 kg)   LMP 12/01/2017   SpO2 100%   BMI 26.30 kg/m   GEN: Pleasant, interactive, well-appearing; in no acute distress HEENT:  Normocephalic and atraumatic. PERRLA. Sclera white. Nasal turbinates pink, moist and patent bilaterally. No rhinorrhea present. Oropharynx pink and moist, without exudate or edema. No lesions, ulcerations, or postnasal drip.  Mallampati 2 NECK:  Supple w/ fair ROM. No JVD present. Thyroid symmetrical with no goiter or nodules palpated. No lymphadenopathy.   CV: RRR, no m/r/g, no peripheral edema. Pulses intact, +2 bilaterally. No cyanosis, pallor or clubbing. PULMONARY:  Unlabored, regular breathing. Clear bilaterally A&P w/o wheezes/rales/rhonchi. No accessory muscle use.  GI: BS present and normoactive. Soft, non-tender to palpation. No organomegaly or masses detected.  MSK: No erythema, warmth or tenderness. Cap refil <2 sec all extrem. No deformities or joint swelling noted.  Neuro: A/Ox3. No focal deficits noted.   Skin: Warm, no lesions or rashe Psych: Normal affect and behavior. Judgement and thought content appropriate.     Lab Results:  CBC    Component Value Date/Time   WBC 9.2 12/31/2022 1433   RBC 3.98 12/31/2022 1433   HGB 11.9 (L) 12/31/2022 1433   HGB 12.8 06/25/2017 1027   HCT 36.0 12/31/2022 1433   HCT 38.3 06/25/2017 1027   PLT 292 12/31/2022 1433   PLT 348 06/25/2017 1027   MCV 90.5 12/31/2022 1433   MCV 90 06/25/2017 1027   MCV 80 12/07/2012 1809   MCH 29.9 12/31/2022 1433   MCHC 33.1 12/31/2022 1433   RDW 15.1 12/31/2022 1433   RDW 15.0 06/25/2017  1027   RDW 17.8 (H) 12/07/2012 1809   LYMPHSABS 1.3 06/12/2022 0516   LYMPHSABS 3.3 (H) 09/11/2016 1131   MONOABS 0.8 06/12/2022 0516   EOSABS 0.2 06/12/2022 0516   EOSABS 0.2 09/11/2016 1131   BASOSABS 0.0 06/12/2022 0516   BASOSABS 0.0 09/11/2016 1131    BMET    Component Value Date/Time   NA 140 12/31/2022 1433   NA 139 06/25/2017 1027   NA 134 (L) 12/07/2012 1809   K 3.8 12/31/2022 1433   K 3.4 (L) 12/07/2012 1809   CL 102 12/31/2022 1433   CL 103 12/07/2012 1809   CO2 29 12/31/2022 1433   CO2 25 12/07/2012 1809   GLUCOSE 95 12/31/2022 1433   GLUCOSE 274 (H) 12/07/2012 1809   BUN 17 12/31/2022 1433   BUN 10 06/25/2017 1027   BUN 10 12/07/2012 1809   CREATININE 0.8 02/24/2023 0000   CREATININE 1.00 12/31/2022 1433   CREATININE 0.71 03/14/2022 1336   CALCIUM 9.4 12/31/2022 1433   CALCIUM 8.6 12/07/2012 1809   GFRNONAA >60 12/31/2022 1433   GFRNONAA 84 09/07/2019 1019   GFRAA 97 09/07/2019 1019    BNP No results found for: "BNP"   Imaging:  No results found.  bcg vaccine injection 81 mg     Date Action Dose Route User   06/11/2023 1542 Given 81 mg Bladder Instillation (Bladder) Cannon Kettle, CMA           No data to display          No results found for: "NITRICOXIDE"      Assessment &  Plan:   COPD, severe (HCC) Severe COPD with high symptom burden.  Compensated on current regimen.  Currently maintained on Trelegy.  Ignacia Palma previously but it made her cough more.  No recent exacerbations or hospitalizations.  Smoking cessation advised.  Action plan in place.  Encouraged to avoid known triggers.  Patient Instructions  Continue Albuterol inhaler 2 puffs every 6 hours as needed for shortness of breath or wheezing. Notify if symptoms persist despite rescue inhaler/neb use.  Continue flonase nasal spray 2 sprays each nostril daily  Continue supplemental oxygen 1 lpm for goal >88-90% at night and as needed with activity  Continue  Trelegy 1 puff daily. Brush tongue and rinse mouth afterwards   Lung cancer screening CT chest - needs to be scheduled    Nicotine patches - apply 1 patch every 24 hours and rotate site. Work on quitting smoking while using these  Given your history of mild sleep apnea and current symptoms, we will have you complete an in lab sleep study. Someone will contact you to schedule this.   We discussed how untreated sleep apnea puts an individual at risk for cardiac arrhthymias, pulm HTN, DM, stroke and increases their risk for daytime accidents. We also briefly reviewed treatment options including weight loss, side sleeping position, oral appliance, CPAP therapy or referral to ENT for possible surgical options   Follow up in 8 weeks with Dr. Belia Heman or Philis Nettle. If symptoms do not improve or worsen, please contact office for sooner follow up or seek emergency care.    Chronic respiratory failure with hypoxia (HCC) Primarily nocturnal.  No exertional hypoxia on previous walk test 01/2023.  Encouraged to continue monitoring for goal greater than 80 to 90%.  Utilize 1 L/min at night.  OSA (obstructive sleep apnea) History of mild OSA with AHI 8/h in 2022.  She does have symptoms of sleep apnea including daytime fatigue, snoring and sleep disturbance.  Given ongoing symptoms, recommend repeat sleep study to reassess severity of sleep apnea.  She will need an in lab split-night study given nocturnal hypoxemia, on oxygen.  Order placed today.  Reviewed risks of untreated sleep apnea.  Safe driving practices reviewed.  Bladder cancer (HCC) Stable on recent imaging.  Follow-up with urology as scheduled.  Tobacco dependency Active smoker.  NRT therapy prescribed today.  Side effect profile reviewed.  Will follow-up with lung cancer screening program on when she can reenroll after completing bladder cancer treatment.  The patient's current tobacco use: 1 ppd The patient was advised to quit and impact of  smoking on their health.  I assessed the patient's willingness to attempt to quit. I provided methods and skills for cessation. We reviewed medication management of smoking session drugs if appropriate. Resources to help quit smoking were provided. A smoking cessation quit date was set: 07/14/2023 Follow-up was arranged in our clinic.  The amount of time spent counseling patient was 5 mins      I spent 35 minutes of dedicated to the care of this patient on the date of this encounter to include pre-visit review of records, face-to-face time with the patient discussing conditions above, post visit ordering of testing, clinical documentation with the electronic health record, making appropriate referrals as documented, and communicating necessary findings to members of the patients care team.  Noemi Chapel, NP 06/13/2023  Pt aware and understands NP's role.

## 2023-06-13 NOTE — Assessment & Plan Note (Addendum)
 History of mild OSA with AHI 8/h in 2022.  She does have symptoms of sleep apnea including daytime fatigue, snoring and sleep disturbance.  Given ongoing symptoms, recommend repeat sleep study to reassess severity of sleep apnea.  She will need an in lab split-night study given nocturnal hypoxemia, on oxygen.  Order placed today.  Reviewed risks of untreated sleep apnea.  Safe driving practices reviewed.

## 2023-06-13 NOTE — Patient Instructions (Addendum)
 Continue Albuterol inhaler 2 puffs every 6 hours as needed for shortness of breath or wheezing. Notify if symptoms persist despite rescue inhaler/neb use.  Continue flonase nasal spray 2 sprays each nostril daily  Continue supplemental oxygen 1 lpm for goal >88-90% at night and as needed with activity  Continue Trelegy 1 puff daily. Brush tongue and rinse mouth afterwards   Lung cancer screening CT chest - needs to be scheduled    Nicotine patches - apply 1 patch every 24 hours and rotate site. Work on quitting smoking while using these  Given your history of mild sleep apnea and current symptoms, we will have you complete an in lab sleep study. Someone will contact you to schedule this.   We discussed how untreated sleep apnea puts an individual at risk for cardiac arrhthymias, pulm HTN, DM, stroke and increases their risk for daytime accidents. We also briefly reviewed treatment options including weight loss, side sleeping position, oral appliance, CPAP therapy or referral to ENT for possible surgical options   Follow up in 8 weeks with Dr. Belia Heman or Philis Nettle. If symptoms do not improve or worsen, please contact office for sooner follow up or seek emergency care.

## 2023-06-13 NOTE — Assessment & Plan Note (Signed)
 Severe COPD with high symptom burden.  Compensated on current regimen.  Currently maintained on Trelegy.  Brandi Calderon previously but it made her cough more.  No recent exacerbations or hospitalizations.  Smoking cessation advised.  Action plan in place.  Encouraged to avoid known triggers.  Patient Instructions  Continue Albuterol inhaler 2 puffs every 6 hours as needed for shortness of breath or wheezing. Notify if symptoms persist despite rescue inhaler/neb use.  Continue flonase nasal spray 2 sprays each nostril daily  Continue supplemental oxygen 1 lpm for goal >88-90% at night and as needed with activity  Continue Trelegy 1 puff daily. Brush tongue and rinse mouth afterwards   Lung cancer screening CT chest - needs to be scheduled    Nicotine patches - apply 1 patch every 24 hours and rotate site. Work on quitting smoking while using these  Given your history of mild sleep apnea and current symptoms, we will have you complete an in lab sleep study. Someone will contact you to schedule this.   We discussed how untreated sleep apnea puts an individual at risk for cardiac arrhthymias, pulm HTN, DM, stroke and increases their risk for daytime accidents. We also briefly reviewed treatment options including weight loss, side sleeping position, oral appliance, CPAP therapy or referral to ENT for possible surgical options   Follow up in 8 weeks with Dr. Belia Heman or Philis Nettle. If symptoms do not improve or worsen, please contact office for sooner follow up or seek emergency care.

## 2023-06-13 NOTE — Assessment & Plan Note (Signed)
 Stable on recent imaging.  Follow-up with urology as scheduled.

## 2023-06-13 NOTE — Assessment & Plan Note (Signed)
 Active smoker.  NRT therapy prescribed today.  Side effect profile reviewed.  Will follow-up with lung cancer screening program on when she can reenroll after completing bladder cancer treatment.  The patient's current tobacco use: 1 ppd The patient was advised to quit and impact of smoking on their health.  I assessed the patient's willingness to attempt to quit. I provided methods and skills for cessation. We reviewed medication management of smoking session drugs if appropriate. Resources to help quit smoking were provided. A smoking cessation quit date was set: 07/14/2023 Follow-up was arranged in our clinic.  The amount of time spent counseling patient was 5 mins

## 2023-06-15 LAB — CULTURE, URINE COMPREHENSIVE

## 2023-06-16 ENCOUNTER — Other Ambulatory Visit: Payer: Self-pay | Admitting: Internal Medicine

## 2023-06-16 NOTE — Progress Notes (Unsigned)
 BCG Bladder Instillation  BCG # 2/3  She is not having symptom of UTI today.  Due to Bladder Cancer patient is present today for a BCG treatment. Patient was cleaned and prepped in a sterile fashion with betadine. A 14 FR catheter was inserted, urine return was noted 20 ml, urine was yellow in color.  50ml of reconstituted BCG was instilled into the bladder. The catheter was then removed. Patient tolerated well, no complications were noted  Performed by: Michiel Cowboy, PA-C and Baltazar Apo, CMA   Follow up/ Additional notes: 1 week for number 3 out of 3 BCG

## 2023-06-18 ENCOUNTER — Ambulatory Visit (INDEPENDENT_AMBULATORY_CARE_PROVIDER_SITE_OTHER): Payer: 59 | Admitting: Urology

## 2023-06-18 ENCOUNTER — Encounter: Payer: Self-pay | Admitting: Urology

## 2023-06-18 VITALS — BP 101/63 | HR 77 | Ht 61.0 in

## 2023-06-18 DIAGNOSIS — C679 Malignant neoplasm of bladder, unspecified: Secondary | ICD-10-CM | POA: Diagnosis not present

## 2023-06-18 DIAGNOSIS — N3281 Overactive bladder: Secondary | ICD-10-CM

## 2023-06-18 LAB — URINALYSIS, COMPLETE
Bilirubin, UA: NEGATIVE
Ketones, UA: NEGATIVE
Leukocytes,UA: NEGATIVE
Nitrite, UA: POSITIVE — AB
Protein,UA: NEGATIVE
RBC, UA: NEGATIVE
Specific Gravity, UA: 1.015 (ref 1.005–1.030)
Urobilinogen, Ur: 0.2 mg/dL (ref 0.2–1.0)
pH, UA: 5.5 (ref 5.0–7.5)

## 2023-06-18 LAB — MICROSCOPIC EXAMINATION

## 2023-06-18 MED ORDER — BCG LIVE 50 MG IS SUSR
3.2400 mL | Freq: Once | INTRAVESICAL | Status: AC
  Administered 2023-06-18: 81 mg via INTRAVESICAL

## 2023-06-18 NOTE — Telephone Encounter (Signed)
 Requested medication (s) are due for refill today: yes  Requested medication (s) are on the active medication list: yes  Last refill:  05/19/23  Future visit scheduled: yes  Notes to clinic:  Unable to refill per protocol, cannot delegate. Due for refill, routing for approval.      Requested Prescriptions  Pending Prescriptions Disp Refills   clonazePAM (KLONOPIN) 0.5 MG tablet [Pharmacy Med Name: CLONAZEPAM 0.5 MG TABLET] 30 tablet 0    Sig: TAKE 1 TABLET BY MOUTH EVERY DAY AS NEEDED FOR ANXIETY     Not Delegated - Psychiatry: Anxiolytics/Hypnotics 2 Failed - 06/18/2023  8:52 AM      Failed - This refill cannot be delegated      Failed - Urine Drug Screen completed in last 360 days      Passed - Patient is not pregnant      Passed - Valid encounter within last 6 months    Recent Outpatient Visits           2 months ago Encounter for general adult medical examination with abnormal findings   [place] [facility] [place], Salvadore Oxford, NP   3 months ago Viral URI with cough   [place] [facility] [place], [place] W, NP   7 months ago Hyperlipidemia associated with type 2 diabetes mellitus [facility])   [place] [facility] [place], Gentry Fitz A, RPH-CPP   9 months ago Aortic atherosclerosis [facility])   [place] [facility] [place], [place], NP   10 months ago Type 2 diabetes mellitus with hyperglycemia, without long-term current use of insulin [facility])   [place] [facility] [place], Jackelyn Poling, RPH-CPP       Future Appointments             Today McGowan, Elana [facility] [place]   In 1 week McGowan, Elana [facility] [place]   In 3 months Simpson, Salvadore Oxford, NP [place] [facility]

## 2023-06-18 NOTE — Patient Instructions (Signed)
 Follow up as scheduled.  Please call and ask to speak with a nurse if you develop questions or concerns.

## 2023-06-24 NOTE — Progress Notes (Unsigned)
 BCG Bladder Instillation  BCG # 3/3  Due to Bladder Cancer patient is present today for a BCG treatment. Patient was cleaned and prepped in a sterile fashion with betadine. A 14 FR catheter was inserted, urine return was noted 30 ml, urine was yellow in color.  50ml of reconstituted BCG was instilled into the bladder. The catheter was then removed. Patient tolerated well, no complications were noted  Performed by: Michiel Cowboy, PA-C   Follow up/ Additional notes: Keep cysto in May with Dr. Richardo Hanks

## 2023-06-25 ENCOUNTER — Ambulatory Visit (INDEPENDENT_AMBULATORY_CARE_PROVIDER_SITE_OTHER): Payer: 59 | Admitting: Urology

## 2023-06-25 DIAGNOSIS — C679 Malignant neoplasm of bladder, unspecified: Secondary | ICD-10-CM

## 2023-06-25 LAB — URINALYSIS, COMPLETE
Bilirubin, UA: NEGATIVE
Ketones, UA: NEGATIVE
Leukocytes,UA: NEGATIVE
Nitrite, UA: POSITIVE — AB
Protein,UA: NEGATIVE
RBC, UA: NEGATIVE
Specific Gravity, UA: 1.02 (ref 1.005–1.030)
Urobilinogen, Ur: 0.2 mg/dL (ref 0.2–1.0)
pH, UA: 5.5 (ref 5.0–7.5)

## 2023-06-25 LAB — MICROSCOPIC EXAMINATION: Epithelial Cells (non renal): >10 /HPF — AB (ref 0–10)

## 2023-06-25 MED ORDER — BCG LIVE 50 MG IS SUSR
3.2400 mL | Freq: Once | INTRAVESICAL | Status: AC
  Administered 2023-06-25: 81 mg via INTRAVESICAL

## 2023-06-25 NOTE — Addendum Note (Signed)
 Addended by: Cannon Kettle on: 06/25/2023 04:13 PM   Modules accepted: Orders

## 2023-06-27 ENCOUNTER — Ambulatory Visit: Payer: Self-pay | Admitting: *Deleted

## 2023-06-27 NOTE — Patient Outreach (Signed)
 Care Coordination   06/27/2023 Name: Brandi Calderon MRN: [id number] DOB: [birth year]   Care Coordination Outreach Attempts:  An unsuccessful outreach was attempted for an appointment today.  Follow Up Plan:  Additional outreach attempts will be made to offer the patient complex care management information and services.   Encounter Outcome:  No Answer   Care Coordination Interventions:  No, not indicated    Rodney Langton, RN, MSN, CCM [place]  [facility], [facility] RN Care Coordinator Direct Dial: [phone] / Main [phone] Fax [phone] Email: [email or web address]: [email or web address]

## 2023-07-08 ENCOUNTER — Other Ambulatory Visit: Payer: Self-pay | Admitting: Internal Medicine

## 2023-07-08 DIAGNOSIS — U071 COVID-19: Secondary | ICD-10-CM

## 2023-07-08 DIAGNOSIS — R0602 Shortness of breath: Secondary | ICD-10-CM

## 2023-07-08 NOTE — Telephone Encounter (Signed)
 Requested Prescriptions  Pending Prescriptions Disp Refills   albuterol (VENTOLIN HFA) 108 (90 Base) MCG/ACT inhaler [Pharmacy Med Name: ALBUTEROL HFA (PROAIR) INHALER] 8.5 each 0    Sig: INHALE 1-2 PUFFS BY MOUTH EVERY 6 HOURS AS NEEDED FOR WHEEZE OR SHORTNESS OF BREATH     Pulmonology:  Beta Agonists 2 Failed - 07/08/2023  4:13 PM      Failed - Valid encounter within last 12 months    Recent Outpatient Visits   None     Future Appointments             In 3 months [name], Salvadore [place], NP [place] [facility]            Passed - Last BP in normal range    BP Readings from Last 1 Encounters:  06/18/23 101/63         Passed - Last Heart Rate in normal range    Pulse Readings from Last 1 Encounters:  06/18/23 77

## 2023-07-09 ENCOUNTER — Ambulatory Visit: Attending: Otolaryngology

## 2023-07-09 DIAGNOSIS — G4733 Obstructive sleep apnea (adult) (pediatric): Secondary | ICD-10-CM

## 2023-07-09 DIAGNOSIS — G4734 Idiopathic sleep related nonobstructive alveolar hypoventilation: Secondary | ICD-10-CM

## 2023-07-09 DIAGNOSIS — G4761 Periodic limb movement disorder: Secondary | ICD-10-CM | POA: Diagnosis not present

## 2023-07-16 DIAGNOSIS — G4733 Obstructive sleep apnea (adult) (pediatric): Secondary | ICD-10-CM

## 2023-07-21 ENCOUNTER — Telehealth (INDEPENDENT_AMBULATORY_CARE_PROVIDER_SITE_OTHER): Payer: Self-pay | Admitting: Sleep Medicine

## 2023-07-21 NOTE — Telephone Encounter (Signed)
 PSG uploaded (AHI 1.3, O2 nadir 86%). Study was negative for OSA but revealed snoring. Recommend weight loss, oral appliance and positional therapy.

## 2023-07-24 ENCOUNTER — Other Ambulatory Visit: Payer: Self-pay | Admitting: Internal Medicine

## 2023-07-24 NOTE — Telephone Encounter (Signed)
 Patient advised of PSG results. Patient did agree to oral appliance. Does want to know more about positional therapy. Please advise.

## 2023-07-25 ENCOUNTER — Other Ambulatory Visit: Payer: Self-pay | Admitting: *Deleted

## 2023-07-25 ENCOUNTER — Other Ambulatory Visit: Payer: Self-pay

## 2023-07-25 NOTE — Telephone Encounter (Signed)
 NA. No voicemail. Per Canary Ceo, NP we can place referral to dentist for oral appliance. Or patient may call insurance to find out if this is covered.

## 2023-07-25 NOTE — Patient Outreach (Signed)
 Complex Care Management   Visit Note  07/25/2023  Name:  Brandi Calderon MRN: [id number] DOB: 1965-09-11  Situation: Referral received for Complex Care Management related to Diabetes with Complications and bladder cancer  I obtained verbal consent from Patient.  Visit completed with patient  on the phone  Patient has done well since the additional 3 cancer treatments, will have repeat cystoscopy next month to determine if more treatment is needed.  Denies any urgent concerns, encouraged to contact this care manager with questions.   Background:   Past Medical History:  Diagnosis Date   Anxiety    Aortic atherosclerosis (HCC)    Arthritis    Asthma    Back pain    Bladder cancer (HCC)    Bladder tumor    COPD (chronic obstructive pulmonary disease) (HCC)    Depression    GERD (gastroesophageal reflux disease)    Headache    History of COVID-19    Hyperlipidemia    Hypertension    Neuromuscular disorder (HCC)    Sleep apnea    Tendonitis    Type 2 diabetes mellitus without complication (HCC)    Urinary tract infection due to extended-spectrum beta lactamase (ESBL) producing Escherichia coli 05/22/2022    Assessment: Patient Reported Symptoms:  Cognitive Cognitive Status: Alert and oriented to person, place, and time, Normal speech and language skills Cognitive/Intellectual Conditions Management [RPT]: None reported or documented in medical history or problem list   Health Maintenance Behaviors: Annual physical exam, Stress management Healing Pattern: Average Health Facilitated by: Healthy diet, Pain control, Rest  Neurological Neurological Review of Symptoms: No symptoms reported    HEENT HEENT Symptoms Reported: No symptoms reported      Cardiovascular Cardiovascular Symptoms Reported: No symptoms reported Does patient have uncontrolled Hypertension?: No    Respiratory Respiratory Symptoms Reported: Shortness of breath (intermittent) Respiratory Conditions:  COPD Respiratory Self-Management Outcome: 4 (good)  Endocrine Patient reports the following symptoms related to hypoglycemia or hyperglycemia : No symptoms reported Is patient diabetic?: Yes Is patient checking blood sugars at home?: No (check every other day, yesterday 140) Endocrine Conditions: Diabetes  Gastrointestinal Gastrointestinal Symptoms Reported: No symptoms reported   Nutrition Risk Screen (CP): No indicators present  Genitourinary Genitourinary Symptoms Reported: No symptoms reported Genitourinary Conditions: Bladder cancer  Integumentary Integumentary Symptoms Reported: No symptoms reported    Musculoskeletal Musculoskelatal Symptoms Reviewed: No symptoms reported   Falls in the past year?: No Number of falls in past year: 1 or less Was there an injury with Fall?: No Fall Risk Category Calculator: 0 Patient Fall Risk Level: Low Fall Risk    Psychosocial Psychosocial Symptoms Reported: No symptoms reported            07/25/2023    4:30 PM  Depression screen PHQ 2/9  Decreased Interest 0  Down, Depressed, Hopeless 0  PHQ - 2 Score 0    There were no vitals filed for this visit.  Medications Reviewed Today     Reviewed by Holland Lundborg, RN (Registered Nurse) on 07/25/23 at 1325  Med List Status: <None>   Medication Order Taking? Sig Documenting Provider Last Dose Status Informant  albuterol  (VENTOLIN  HFA) 108 (90 Base) MCG/ACT inhaler [id number] Yes INHALE 1-2 PUFFS BY MOUTH EVERY 6 HOURS AS NEEDED FOR WHEEZE OR SHORTNESS OF BREATH Carollynn Cirri, NP Taking Active   aspirin  EC 81 MG tablet [id number] Yes Take 1 tablet (81 mg total) by mouth daily. Swallow whole. Carollynn Cirri, NP Taking Active  Aspirin -Salicylamide-Caffeine (BC HEADACHE POWDER PO) [id number] Yes Take 4 packets by mouth daily as needed (pain). [provider] Taking Active Self  atorvastatin  (LIPITOR) 80 MG tablet [id number] Yes Take 80 mg by mouth at bedtime. [provider]  Taking Active   Blood Glucose Monitoring Suppl (ONETOUCH VERIO FLEX SYSTEM) w/Device KIT [id number] Yes Use to check blood sugar daily.  DX E11.9 Carollynn Cirri, NP Taking Active   clonazePAM  (KLONOPIN ) 0.5 MG tablet [id number] Yes TAKE 1 TABLET BY MOUTH EVERY DAY AS NEEDED FOR ANXIETY [name], Rankin Buzzard, NP Taking Active   DULoxetine (CYMBALTA) 30 MG capsule [id number] Yes Take 30 mg by mouth at bedtime. [provider] Taking Active   fluticasone  (FLONASE ) 50 MCG/ACT nasal spray [id number] Yes SPRAY 2 SPRAYS INTO EACH NOSTRIL EVERY DAY [name], Rankin Buzzard, NP Taking Active   Fluticasone -Umeclidin-Vilant (TRELEGY ELLIPTA ) 100-62.5-25 MCG/ACT AEPB [id number] Yes Inhale 1 puff into the lungs daily. Roetta Clarke, NP Taking Active   furosemide  (LASIX ) 40 MG tablet [id number] Yes TAKE 1 TABLET BY MOUTH EVERY DAY [name], Rankin Buzzard, NP Taking Active   gabapentin  (NEURONTIN ) 300 MG capsule [id number] Yes Take 300 mg by mouth at bedtime. [provider] Taking Active Self  glipiZIDE  (GLUCOTROL  XL) 5 MG 24 hr tablet [id number] Yes Take 5 mg by mouth daily with breakfast. [provider] Taking Active Self  glucose blood test strip [id number] Yes Use as instructed Carollynn Cirri, NP Taking Active Self  JARDIANCE 25 MG TABS tablet [id number] Yes Take 25 mg by mouth every morning. [provider] Taking Active Self  Lancets Emmett Harman ULTRASOFT) lancets [id number] Yes Use as instructed Carollynn Cirri, NP Taking Active Self  lisinopril (ZESTRIL) 5 MG tablet [id number] Yes Take 5 mg by mouth every morning. [provider] Taking Active   metFORMIN  (GLUCOPHAGE ) 1000 MG tablet [id number] Yes TAKE 1 TABLET (1,000 MG TOTAL) BY MOUTH 2 (TWO) TIMES DAILY WITH A MEAL. Carollynn Cirri, NP Taking Active Self  nicotine  (NICODERM CQ  - DOSED IN MG/24 HOURS) 21 mg/24hr patch [id number] Yes Place 1 patch (21 mg total) onto the skin daily. Roetta Clarke, NP Taking Active   ondansetron  (ZOFRAN ) 4  MG tablet [id number] Yes Take 4 mg by mouth every 8 (eight) hours as needed for nausea. [provider] Taking Active   oxybutynin  (DITROPAN  XL) 15 MG 24 hr tablet [id number] Yes Take 1 tablet (15 mg total) by mouth daily. Lawerence Pressman, MD Taking Active   OXYGEN  [id number] Yes Inhale 1 L into the lungs at bedtime. [provider] Taking Active Self  pantoprazole  (PROTONIX ) 40 MG tablet [id number] Yes TAKE 1 TABLET (40 MG TOTAL) BY MOUTH DAILY AS NEEDED (HEARTBURN). Carollynn Cirri, NP Taking Active   polyethylene glycol (MIRALAX ) 17 g packet [id number] No Take 17 g by mouth daily.  Patient not taking: Reported on 07/25/2023   Heather Litter, MD Not Taking Active   Semaglutide, 2 MG/DOSE, 8 MG/3ML [facility] [id number] Yes Inject 2 mg into the skin once a week. Monday [provider] Taking Active   Med List Note Humberto Magnus, RN 06/25/18 5400): UDS 06-25-2018            Recommendation:   Specialty provider follow-up scheduled for 5/20  Follow Up Plan:   Telephone follow up appointment date/time:  5/27 at 230 pm Will send information for dental options.   Holland Lundborg, RN, MSN, CCM [place]  [facility], [facility] RN Care  Coordinator Direct Dial: [phone] / Main [phone] Fax [phone] Email: [email or web address]: [email or web address]

## 2023-07-25 NOTE — Telephone Encounter (Signed)
 Patient is returning call . I relayed the message I seen with this telephone encounter and patient said ok

## 2023-07-25 NOTE — Patient Instructions (Addendum)
 Visit Information  Thank you for taking time to visit with me today. Please don't hesitate to contact me if I can be of assistance to you before our next scheduled appointment.    Please see the below information regarding the dental resources.   Caretha Chapel, DDS General Dentist [address] [place], [place] [address]  [phone]   [facility] [address] [place], [place] [address] Specialty: General Dentist Phone: [phone]  [facility] [address] [place], [place] [address] Specialty: General Dentist Phone: [phone]  Melchor, Christophe Cram, DDS General Dentist [phone] phone for [facility], DDS [address] [place], [place] [address]  [facility] [place] [address] [place], [place] [address] [phone]   [facility] - [facility] [address] [place], [place] [address] [phone]   [facility] Department of [place] [facility] [address], 2nd floor [place], [place] [address] [phone]   [facility] - Dental [address] [place], [place] [address] [phone]   [facility] [address] [place] [phone]   Our next appointment is by telephone on 5/27 at 230pm  Please call the care guide team at [phone] if you need to cancel or reschedule your appointment.   Following is a copy of your care plan:   Goals Addressed             This Visit's Progress    COMPLETED: Effective treatment for bladder cancer   On track    Interventions Today    Flowsheet Row Most Recent Value  Chronic Disease   Chronic disease during today's visit Diabetes, Other  [bladder cancer]  General Interventions   General Interventions Discussed/Reviewed General Interventions Reviewed, Doctor Visits, Labs  Labs Hgb A1c every 3 months  [most recent 7.1]  Doctor Visits Discussed/Reviewed Doctor Visits  Reviewed, Specialist  [Will restart bladder treatments on 3/12, 3/19, and 3/26. Pulmonary 3/14, endocrinology 3/25]  PCP/Specialist Visits Compliance with follow-up visit  Education Interventions   Education Provided Provided Education  Provided Verbal Education On Other, Medication, When to see the doctor, Blood Sugar Monitoring  [Meds reviewed, will call to get refill for Ozempic. Blood sugars range 150-160s.  Denies any issues with urination (no pain or bleeding)]           COMPLETED: Management of DM   On track    Care Coordination Interventions: Provided education to patient about basic DM disease process Reviewed medications with patient and discussed importance of medication adherence Counseled on importance of regular laboratory monitoring as prescribed Discussed plans with patient for ongoing care management follow up and provided patient with direct contact information for care management team Provided patient with written educational materials related to hypo and hyperglycemia and importance of correct treatment      [facility]I RN Care Plan       Problems:  Chronic Disease Management support and education needs related to DMII and bladder cancer  Goal: Over the next 45 days the Patient will attend all scheduled medical appointments: pharmacy outreach on 4/8 and urology for cystoscopy on 5/20 as evidenced by documented completion of appointments via EMR        continue to work with RN Care Manager and/or Social Worker to address care management and care coordination needs related to DMII and bladder cancer as evidenced by adherence to care management team scheduled appointments  take all medications exactly as prescribed and will call provider for medication related questions as evidenced by patient reported compliance    work with pharmacist to address medication and disease management related to DMII as evidenced by review of electronic medical record and patient or pharmacist  report     Interventions:   Diabetes Interventions: Assessed patient's understanding of A1c goal: <7% Provided education to patient about basic DM disease process Reviewed medications with patient and discussed importance of medication adherence Counseled on importance of regular laboratory monitoring as prescribed Advised patient, providing education and rationale, to check cbg daily and record, calling MD for findings outside established parameters Screening for signs and symptoms of depression related to chronic disease state  Assessed social determinant of health barriers Lab Results  Component Value Date   HGBA1C 7.1 (A) 04/03/2023    Oncology: Assessment of understanding of oncology diagnosis:  Assessed patient understanding of cancer diagnosis and recommended treatment plan Reviewed upcoming provider appointments and treatment appointments Assessed available transportation to appointments and treatments. Has consistent/reliable transportation: Yes Assessed support system. Has consistent/reliable family or other support: Yes PHQ2/PHQ9 performed  Patient Self-Care Activities:  Attend all scheduled provider appointments Call provider office for new concerns or questions  Notify RN Care Manager of TOC call rescheduling needs Take medications as prescribed   schedule appointment with eye doctor check feet daily for cuts, sores or redness enter blood sugar readings and medication or insulin  into daily log set goal weight keep a food diary manage portion size  Plan:  The patient has been provided with contact information for the care management team and has been advised to call with any health related questions or concerns.              Please call the Suicide and Crisis Lifeline: 988 call the [place]  National Suicide Prevention Lifeline: (873) 087-2085 or TTY: (989)218-5612 TTY 708-832-7280) to talk to a trained counselor call 1-800-273-TALK (toll free, 24 hour  hotline) call 911 if you are experiencing a Mental Health or Behavioral Health Crisis or need someone to talk to.  The patient verbalized understanding of instructions, educational materials, and care plan provided today and agreed to receive a mailed copy of patient instructions, educational materials, and care plan.   Holland Lundborg, RN, MSN, CCM [facility], [facility] RN Care Coordinator Direct Dial: [phone] / Main [phone] Fax [phone] Email: [email or web address]: [email or web address]

## 2023-07-25 NOTE — Telephone Encounter (Signed)
 Requested medications are due for refill today.  yes  Requested medications are on the active medications list.  yes  Last refill. 06/18/2023 #30 0 rf  Future visit scheduled.   yes  Notes to clinic.  Refill not delegated.    Requested Prescriptions  Pending Prescriptions Disp Refills   clonazePAM  (KLONOPIN ) 0.5 MG tablet [Pharmacy Med Name: CLONAZEPAM  0.5 MG TABLET] 30 tablet 0    Sig: TAKE 1 TABLET BY MOUTH EVERY DAY AS NEEDED FOR ANXIETY     Not Delegated - Psychiatry: Anxiolytics/Hypnotics 2 Failed - 07/25/2023 11:10 AM      Failed - This refill cannot be delegated      Failed - Urine Drug Screen completed in last 360 days      Failed - Valid encounter within last 6 months    Recent Outpatient Visits   None     Future Appointments             In 2 months [name], Rankin Buzzard, NP [place] [facility], [facility]            Passed - Patient is not pregnant

## 2023-07-28 ENCOUNTER — Other Ambulatory Visit: Payer: 59 | Admitting: Pharmacist

## 2023-07-28 DIAGNOSIS — J449 Chronic obstructive pulmonary disease, unspecified: Secondary | ICD-10-CM

## 2023-07-28 DIAGNOSIS — E1142 Type 2 diabetes mellitus with diabetic polyneuropathy: Secondary | ICD-10-CM

## 2023-07-28 DIAGNOSIS — Z794 Long term (current) use of insulin: Secondary | ICD-10-CM

## 2023-07-28 DIAGNOSIS — F17200 Nicotine dependence, unspecified, uncomplicated: Secondary | ICD-10-CM

## 2023-07-28 NOTE — Patient Instructions (Signed)
Goals Addressed             This Visit's Progress    Pharmacy Goals       Our goal A1c is less than 7%. This corresponds with fasting sugars less than 130 and 2 hour after meal sugars less than 180. Please check your blood sugar each morning  Our goal bad cholesterol, or LDL, is less than 70 . This is why it is important to continue taking your atorvastatin  Please follow up with the [place] Quitline for their help with quitting smoking.  The [place] Quitline phone number is: 1-800-784-8669  Feel free to call me with any questions or concerns. I look forward to our next call!  Elisabeth Delles, PharmD, BCACP Clinical Pharmacist [facility] [place] [phone]        

## 2023-07-28 NOTE — Telephone Encounter (Signed)
 I have notified the patient. She will reach out to her insurance company and see if they will cover it. If so she will let us  know so we can do the referral.  /Nothing further needed.

## 2023-07-28 NOTE — Telephone Encounter (Signed)
 Did the patient say ok to contacting her pharmacy or to us  placed the referral to a dentist for the oral appliance?

## 2023-07-28 NOTE — Telephone Encounter (Signed)
 Dr. Ardell Koller or Dr. Abel Abelson. It would be for snoring as she does not have sleep apnea. She will need to verify if her insurance will cover this, as they may not since she doesn't have sleep apnea.

## 2023-07-28 NOTE — Telephone Encounter (Signed)
 Hello I believe patient said she was ok with you placing the referral

## 2023-07-28 NOTE — Progress Notes (Signed)
 07/28/2023 Name: Brandi Calderon MRN: [id number] DOB: 1965-05-13  Chief Complaint  Patient presents with   Medication Management   Medication Adherence    Brandi Calderon is a 58 y.o. year old female who presented for a telephone visit.   They were referred to the pharmacist by their PCP for assistance in managing diabetes, hypertension, and hyperlipidemia.      Subjective:   Care Team: Primary Care Provider: Carollynn Cirri, NP; Next Scheduled Visit: 10/07/2023 Pulmonologist: Brandi Calderon Endocrinologist: Dr. Lorelei Calderon; Next Scheduled Visit: 09/25/2023  Urologist: Brandi Pressman, MD; Next Scheduled Visit: 08/19/2023    Medication Access/Adherence  Current Pharmacy:  [id number] - [place], [place] - [address] [place] [address] Phone: [phone] Fax: [phone]   Patient reports affordability concerns with their medications: No  Patient reports access/transportation concerns to their pharmacy: No  Patient reports adherence concerns with their medications:  No       Patient using weekly pillbox to organize her medications     Diabetes: Patient followed by [facility] Endocrinology for management of T2DM   Current medications:  Metformin  1000 mg twice daily Ozempic 2 mg pen - titrating up as directed, currently taking 24 clicks  weekly on Fridays  Reports tolerating; taking ondansetron  4 mg on day of Ozempic injection as directed by Endocrinologist Jardiance 25 mg daily glipizide  ER 5 mg daily  Medications tried in the past: Trulicity    Recalls recent morning fasting blood sugars ranging 120-130    Denies symptoms of hypoglycemia - Carries glucose tablets   Statin: atorvastatin  80 mg daily   ACE-I: lisinopril 5 mg daily     COPD:   Current medications: Trelegy - 1 puff daily Albuterol  - 1-2 puffs every 6 hours as needed Overnight oxygen    Reports using Trelegy consistently and rinsing mouth after each use and  using rescue (albuterol ) inhaler as needed as directed     Tobacco Abuse: Reports currently smoking ~20 cigarettes/day Triggers: boredom, others smoking around her, stress Reports had success with significantly reducing smoking when with nicotine  patches, but was unable to maintain this reduction when stopped nicotine  patches. Reports considering starting again   Objective:  Lab Results  Component Value Date   HGBA1C 7.1 (A) 04/03/2023    Lab Results  Component Value Date   CREATININE 0.8 02/24/2023   BUN 17 12/31/2022   NA 140 12/31/2022   K 3.8 12/31/2022   CL 102 12/31/2022   CO2 29 12/31/2022    Lab Results  Component Value Date   CHOL 170 03/14/2022   HDL 44 (L) 03/14/2022   LDLCALC 126 02/24/2023   TRIG 119 02/24/2023   CHOLHDL 3.9 03/14/2022    Medications Reviewed Today     Reviewed by Brandi Calderon (Pharmacist) on 07/28/23 at 1406  Med List Status: <None>   Medication Order Taking? Sig Documenting Provider Last Dose Status Informant  albuterol  (VENTOLIN  HFA) 108 (90 Base) MCG/ACT inhaler [id number] Yes INHALE 1-2 PUFFS BY MOUTH EVERY 6 HOURS AS NEEDED FOR WHEEZE OR SHORTNESS OF BREATH Brandi Cirri, NP Taking Active   aspirin  EC 81 MG tablet [id number]  Take 1 tablet (81 mg total) by mouth daily. Swallow whole. Brandi Cirri, NP  Active   Aspirin -Salicylamide-Caffeine (BC HEADACHE POWDER PO) [id number]  Take 4 packets by mouth daily as needed (pain). [provider]  Active Self  atorvastatin  (LIPITOR) 80 MG tablet [id number]  Take 80 mg  by mouth at bedtime. [provider]  Active   Blood Glucose Monitoring Suppl (ONETOUCH VERIO FLEX SYSTEM) w/Device KIT [id number]  Use to check blood sugar daily.  DX E11.9 Brandi Cirri, NP  Active   clonazePAM  (KLONOPIN ) 0.5 MG tablet [id number]  TAKE 1 TABLET BY MOUTH EVERY DAY AS NEEDED FOR ANXIETY [name], Rankin Buzzard, NP  Active   DULoxetine (CYMBALTA) 30 MG capsule [id number]  Take 30 mg by  mouth at bedtime. [provider]  Active   fluticasone  (FLONASE ) 50 MCG/ACT nasal spray [id number]  SPRAY 2 SPRAYS INTO EACH NOSTRIL EVERY DAY [name], Rankin Buzzard, NP  Active   Fluticasone -Umeclidin-Vilant (TRELEGY ELLIPTA ) 100-62.5-25 MCG/ACT AEPB [id number] Yes Inhale 1 puff into the lungs daily. Brandi Clarke, NP Taking Active   furosemide  (LASIX ) 40 MG tablet [id number]  TAKE 1 TABLET BY MOUTH EVERY DAY [name], Rankin Buzzard, NP  Active   gabapentin  (NEURONTIN ) 300 MG capsule [id number]  Take 300 mg by mouth at bedtime. [provider]  Active Self  glipiZIDE  (GLUCOTROL  XL) 5 MG 24 hr tablet [id number]  Take 5 mg by mouth daily with breakfast. [provider]  Active Self  glucose blood test strip [id number]  Use as instructed Brandi Cirri, NP  Active Self  JARDIANCE 25 MG TABS tablet [id number] Yes Take 25 mg by mouth every morning. [provider] Taking Active Self  Lancets Brandi Calderon ULTRASOFT) lancets [id number]  Use as instructed Brandi Cirri, NP  Active Self  lisinopril (ZESTRIL) 5 MG tablet [id number]  Take 5 mg by mouth every morning. [provider]  Active   metFORMIN  (GLUCOPHAGE ) 1000 MG tablet [id number] Yes TAKE 1 TABLET (1,000 MG TOTAL) BY MOUTH 2 (TWO) TIMES DAILY WITH A MEAL. Brandi Cirri, NP Taking Active Self  nicotine  (NICODERM CQ  - DOSED IN MG/24 HOURS) 21 mg/24hr patch [id number]  Place 1 patch (21 mg total) onto the skin daily. Cobb, Mariah Shines, NP  Active   ondansetron  (ZOFRAN ) 4 MG tablet [id number]  Take 4 mg by mouth every 8 (eight) hours as needed for nausea. [provider]  Active   oxybutynin  (DITROPAN  XL) 15 MG 24 hr tablet [id number]  Take 1 tablet (15 mg total) by mouth daily. Brandi Pressman, MD  Active   OXYGEN  [id number]  Inhale 1 L into the lungs at bedtime. [provider]  Active Self  pantoprazole  (PROTONIX ) 40 MG tablet [id number]  TAKE 1 TABLET (40 MG TOTAL) BY MOUTH DAILY AS NEEDED (HEARTBURN). Brandi Cirri, NP  Active   polyethylene glycol (MIRALAX ) 17 g packet [id number]  Take 17 g by mouth daily.  Patient not taking: Reported on 07/25/2023   Brandi Calderon Litter, MD  Active   Semaglutide, 2 MG/DOSE, 8 MG/3ML SOPN [id number]  Inject 2 mg into the skin once a week. Monday [provider]  Active   Med List Note Humberto Magnus, RN 06/25/18 0175): UDS 06-25-2018              Assessment/Plan:   Advise patient to work on staying hydrated with water , rather than sugary beverages.    Diabetes: - Counseled patient on importance of having regular well-balanced meals, while controlling carbohydrate portion sizes Encouraged patient to reduce intake of sugary beverages, to work on drinking water , rather than soda - Patient to monitor home blood sugar in morning before breakfast and at bedtime, keep log of results and have this to review at medical appointments and to call  Endocrinologist sooner for reading outside of established parameters or symptoms    COPD: - Recommend to continue using Trelegy inhaler consistently every day    Tobacco Abuse - Have encouraged patient in plan to restart nicotine  replacement to aid with quitting. Advised patient to consider using OTC benefit to obtain nicotine  patches - Have provided information on 1 800 QUIT NOW support program       Follow Up Plan: Clinic Pharmacist will follow up with patient again on 11/10/2023 at 1:30 PM    Arthur Lash, PharmD, [facility] Clinical Pharmacist [facility] [phone]

## 2023-07-28 NOTE — Telephone Encounter (Signed)
 Katie what dentist do you want to refer the patient to?

## 2023-08-03 ENCOUNTER — Other Ambulatory Visit: Payer: Self-pay | Admitting: Internal Medicine

## 2023-08-03 DIAGNOSIS — U071 COVID-19: Secondary | ICD-10-CM

## 2023-08-03 DIAGNOSIS — R0602 Shortness of breath: Secondary | ICD-10-CM

## 2023-08-05 NOTE — Telephone Encounter (Signed)
 Requested Prescriptions  Pending Prescriptions Disp Refills   albuterol  (VENTOLIN  HFA) 108 (90 Base) MCG/ACT inhaler [Pharmacy Med Name: ALBUTEROL  HFA (PROAIR ) INHALER] 8.5 each 2    Sig: INHALE 1-2 PUFFS BY MOUTH EVERY 6 HOURS AS NEEDED FOR WHEEZE OR SHORTNESS OF BREATH     Pulmonology:  Beta Agonists 2 Failed - 08/05/2023 12:56 PM      Failed - Valid encounter within last 12 months    Recent Outpatient Visits   None     Future Appointments             In 2 months [name], Rankin Buzzard, NP [facility]            Passed - Last BP in normal range    BP Readings from Last 1 Encounters:  06/18/23 101/63         Passed - Last Heart Rate in normal range    Pulse Readings from Last 1 Encounters:  06/18/23 77

## 2023-08-11 ENCOUNTER — Other Ambulatory Visit: Payer: Self-pay | Admitting: Internal Medicine

## 2023-08-12 NOTE — Telephone Encounter (Signed)
 LOV 04/03/2023  Requested Prescriptions  Pending Prescriptions Disp Refills   fluticasone  (FLONASE ) 50 MCG/ACT nasal spray [Pharmacy Med Name: FLUTICASONE  PROP 50 MCG SPRAY] 48 mL 0    Sig: SPRAY 2 SPRAYS INTO EACH NOSTRIL EVERY DAY     Ear, Nose, and Throat: Nasal Preparations - Corticosteroids Failed - 08/12/2023  2:38 PM      Failed - Valid encounter within last 12 months    Recent Outpatient Visits   None     Future Appointments             In 1 month [name], Rankin Buzzard, NP [facility]

## 2023-08-15 ENCOUNTER — Encounter: Payer: Self-pay | Admitting: *Deleted

## 2023-08-19 ENCOUNTER — Ambulatory Visit (INDEPENDENT_AMBULATORY_CARE_PROVIDER_SITE_OTHER): Payer: 59 | Admitting: Urology

## 2023-08-19 ENCOUNTER — Other Ambulatory Visit
Admission: RE | Admit: 2023-08-19 | Discharge: 2023-08-19 | Disposition: A | Discharge status: Home / Self Care | Attending: Urology | Admitting: Urology

## 2023-08-19 VITALS — BP 141/74 | HR 70

## 2023-08-19 DIAGNOSIS — Z8551 Personal history of malignant neoplasm of bladder: Secondary | ICD-10-CM | POA: Diagnosis present

## 2023-08-19 LAB — URINALYSIS, COMPLETE (UACMP) WITH MICROSCOPIC
Bilirubin Urine: NEGATIVE
Glucose, UA: 500 mg/dL — AB
Hgb urine dipstick: NEGATIVE
Leukocytes,Ua: NEGATIVE
Nitrite: NEGATIVE
Protein, ur: NEGATIVE mg/dL
Specific Gravity, Urine: 1.015 (ref 1.005–1.030)
pH: 6.5 (ref 5.0–8.0)

## 2023-08-19 MED ORDER — LIDOCAINE HCL URETHRAL/MUCOSAL 2 % EX GEL
1.0000 | Freq: Once | CUTANEOUS | Status: AC
  Administered 2023-08-19: 1 via URETHRAL

## 2023-08-19 MED ORDER — SULFAMETHOXAZOLE-TRIMETHOPRIM 800-160 MG PO TABS
1.0000 | ORAL_TABLET | Freq: Once | ORAL | Status: AC
  Administered 2023-08-19: 1 via ORAL

## 2023-08-19 NOTE — Patient Instructions (Addendum)
 Radical Cystectomy Radical cystectomy is a surgical procedure to remove the bladder. The bladder is the organ in the lower abdomen that holds urine until it is passed out of the body. You may have a radical cystectomy if you have bladder cancer that has spread into the bladder muscle, the lymph nodes, and other organs. Lymph nodes or other organs may need to be removed during this surgery. This procedure also includes a type of reconstructive surgery that allows a person to urinate without a bladder (urinary diversion). Tell a health care provider about: Any allergies you have. All medicines you are taking, including blood thinners, vitamins, herbs, eye drops, creams, and over-the-counter medicines. Any problems you or family members have had with anesthetic medicines. Any bleeding disorders you have. Any surgeries you have had. Any medical conditions you have or have had. Whether you are pregnant or may be pregnant. What are the risks? This is a major procedure. Problems may occur, including: Infection. Bleeding. Damage to nearby organs. Blockage in your intestine (bowel obstruction). Blood clots that may travel to other parts of the body. Damage to the nerves in the pelvis. Problems having sex. The cancer coming back (recurrence) in another part of the body. What happens before the procedure? When to stop eating and drinking Follow instructions from your health care provider about what you may eat and drink before your procedure. These may include: 8 hours before your procedure Stop eating most foods. Do not eat meat, fried foods, or fatty foods. Eat only light foods, such as toast or crackers. All liquids are okay except energy drinks and alcohol. 6 hours before your procedure Stop eating. Drink only clear liquids, such as water , clear fruit juice, black coffee, plain tea, and sports drinks. Do not drink energy drinks or alcohol. 2 hours before your procedure Stop drinking all  liquids. You may be allowed to take medicines with small sips of water . Medicines Ask your health care provider about: Changing or stopping your regular medicines. This is especially important if you are taking diabetes medicines or blood thinners. Taking medicines such as aspirin  and ibuprofen . These medicines can thin your blood. Do not take these medicines unless your health care provider tells you to take them. Taking over-the-counter medicines, vitamins, herbs, and supplements. Safety Ask your health care provider: How your surgery site will be marked. What steps will be taken to help prevent infection. These steps may include: Removing hair at the surgery site. Washing skin with a germ-killing soap. Taking antibiotic medicine. General instructions Your health care provider may instruct you to do breathing exercises before the procedure to help prevent pneumonia. Do these exercises as directed. Do not use any products that contain nicotine  or tobacco for at least 4 weeks before the procedure. These products include cigarettes, chewing tobacco, and vaping devices, such as e-cigarettes. If you need help quitting, ask your health care provider. Do not drink alcohol. What happens during the procedure?  An IV will be inserted into one of your veins. You may be given one or both of the following: A medicine to help you relax (sedative). A medicine to make you fall asleep (general anesthetic). A tube may be passed through your nose and into your stomach (NG tube). A small, thin tube (catheter) may be inserted into your bladder to drain urine during surgery. Your health care provider will do the procedure laparoscopically or through one large incision. During a laparoscopicradical cystectomy: 3-4 small incisions will be made in your abdomen. Your  abdomen will be filled with gas to make it easy for your surgeon to see internal organs. A laparoscope and other small surgical instruments will  be put through the incisions. In some cases, the surgeon will do the procedure by controlling tools that are attached to a surgical robot. This is called a robot-assisted partial cystectomy. For open radical cystectomy: A large incision will be made in your abdomen and down through the muscles to your bladder. The tissues and organs that are attached to your bladder will be cut. These include lymph nodes, the ureters, the urethra, and blood vessels. The following may also be removed: Prostate gland and glands that make semen (seminal vesicles) in men. Uterus, fallopian tubes, ovaries, and cervix in women. Your surgeon will create a new way for urine to leave your body. Your surgeon will pick the way that is best for you. You may have: An incontinent diversion. An opening in your skin (stoma) will be created using a short piece of your intestine. Urine will leave the body through the stoma. A continent diversion. A pouch to collect urine will be made from a part of your intestine. You will use a catheter to drain urine through a stoma. A neobladder. A new bladder will be made from a part of your intestine. This will allow urine to flow through your urethra like it did before surgery. Your incisions will be closed with stitches (sutures), staples, skin glue, or adhesive strips. A bandage (dressing) will be placed over your incision. If you have a stoma, a bag may be placed over it to collect urine (urostomy bag). If you have a neobladder, a catheter will be placed through your urethra to drain urine from it. A small plastic tube (surgical drain) may be placed to drain fluid from the surgery site as it heals. The procedure may vary among health care providers and hospitals. What happens after the procedure? Your blood pressure, heart rate, breathing rate, and blood oxygen  level will be monitored until you leave the hospital. You will be given pain medicine, antibiotics, and fluids through your  IV. You will continue to have a catheter. You may have a surgical drain if it was placed during surgery. You will be encouraged to: Do breathing exercises, such as coughing and breathing deeply. This will prevent pneumonia. Walk as soon as you can. You may have to wear compression stockings. These stockings help to prevent blood clots and reduce swelling in your legs. Your IV and NG tube can be taken out after you can eat and drink. Summary Radical cystectomy is a surgical procedure to remove the bladder. It may be done if you have cancer that has spread to the muscles of the bladder. Follow instructions from your health care provider about how to get ready for the procedure. After the procedure, you will be monitored closely and encouraged to do deep breathing exercises and walk around. This information is not intended to replace advice given to you by your health care provider. Make sure you discuss any questions you have with your health care provider. Document Revised: 03/27/2021 Document Reviewed: 03/27/2021 Elsevier Patient Education  2024 Elsevier Inc.   Steps to Quit Smoking Smoking tobacco is the leading cause of preventable death. It can affect almost every organ in the body. Smoking puts you and those around you at risk for developing many serious chronic diseases. Quitting smoking can be very challenging. Do not get discouraged if you are not successful the first time. Some  people need to make many attempts to quit before they achieve long-term success. Do your best to stick to your quit plan, and talk with your health care provider if you have any questions or concerns. How do I get ready to quit? When you decide to quit smoking, create a plan to help you succeed. Before you quit: Pick a date to quit. Set a date within the next 2 weeks to give you time to prepare. Write down the reasons why you are quitting. Keep this list in places where you will see it often. Tell your family,  friends, and co-workers that you are quitting. Support from people you are close to can make quitting easier. Talk with your health care provider about your options for quitting smoking. Find out what treatment options are covered by your health insurance. Identify people, places, things, and activities that make you want to smoke (triggers). Avoid them. What first steps can I take to quit smoking? Throw away all cigarettes at home, at work, and in your car. Throw away smoking accessories, such as Set designer. Clean your car. Make sure to empty the ashtray. Clean your home, including curtains and carpets. What strategies can I use to quit smoking? Talk with your health care provider about combining strategies, such as taking medicines while you are also receiving in-person counseling. Using these two strategies together makes you more likely to succeed in quitting than if you used either strategy on its own. If you are pregnant or breastfeeding, talk with your health care provider about finding counseling or other support strategies to quit smoking. Do not take medicine to help you quit smoking unless your health care provider tells you to. Quit right away Quit smoking completely, instead of gradually reducing how much you smoke over a period of time. Stopping smoking right away may be more successful than gradually quitting. Attend in-person counseling to help you build problem-solving skills. You are more likely to succeed in quitting if you attend counseling sessions regularly. Even short sessions of 10 minutes can be effective. Take medicine You may take medicines to help you quit smoking. Some medicines require a prescription. You can also purchase over-the-counter medicines. Medicines may have nicotine  in them to replace the nicotine  in cigarettes. Medicines may: Help to stop cravings. Help to relieve withdrawal symptoms. Your health care provider may recommend: Nicotine  patches,  gum, or lozenges. Nicotine  inhalers or sprays. Non-nicotine  medicine that you take by mouth. Find resources Find resources and support systems that can help you quit smoking and remain smoke-free after you quit. These resources are most helpful when you use them often. They include: Online chats with a Veterinary surgeon. Telephone quitlines. Printed Materials engineer. Support groups or group counseling. Text messaging programs. Mobile phone apps or applications. Use apps that can help you stick to your quit plan by providing reminders, tips, and encouragement. Examples of free services include Quit Guide from the CDC and smokefree.gov  What can I do to make it easier to quit?  Reach out to your family and friends for support and encouragement. Call telephone quitlines, such as 1-800-QUIT-NOW, reach out to support groups, or work with a counselor for support. Ask people who smoke to avoid smoking around you. Avoid places that trigger you to smoke, such as bars, parties, or smoke-break areas at work. Spend time with people who do not smoke. Lessen the stress in your life. Stress can be a smoking trigger for some people. To lessen stress, try: Exercising regularly.  Doing deep-breathing exercises. Doing yoga. Meditating. What benefits will I see if I quit smoking? Over time, you should start to see positive results, such as: Improved sense of smell and taste. Decreased coughing and sore throat. Slower heart rate. Lower blood pressure. Clearer and healthier skin. The ability to breathe more easily. Fewer sick days. Summary Quitting smoking can be very challenging. Do not get discouraged if you are not successful the first time. Some people need to make many attempts to quit before they achieve long-term success. When you decide to quit smoking, create a plan to help you succeed. Quit smoking right away, not slowly over a period of time. Find resources and support systems that can help you  quit smoking and remain smoke-free after you quit. This information is not intended to replace advice given to you by your health care provider. Make sure you discuss any questions you have with your health care provider. Document Revised: 03/09/2021 Document Reviewed: 03/09/2021 Elsevier Patient Education  2024 [employer].

## 2023-08-19 NOTE — Progress Notes (Signed)
 Bladder cancer surveillance note   INDICATION Non-muscle invasive bladder cancer   UROLOGIC HISTORY   Brandi Calderon is a 58 y.o. female who originally presented with gross hematuria and on CT was found to have extensive papillary tumor in the bladder, with 2 large tumors measuring 5 cm and 7 cm respectively.   Initial Diagnosis of Bladder  Year: 06/07/2022 Pathology: HG T1   Treatments for Bladder Cancer 06/07/2022: TURBT for extensive tumor 5 cm and 7 cm, HG T1 07/26/2022: Second look TURBT, no residual tumor, pathology benign   08/2022: Induction BCG x 6  12/2022: Numerous small recurrences, largest 3cm 01/03/2023: TURBT w/gemcitabine  (HG Ta)  03/2023: 2nd induction BCG    AUA Risk Category High   Cystoscopy Procedure Note:   After informed consent and discussion of the procedure and its risks, Brandi Calderon was positioned and prepped in the standard fashion. Cystoscopy was performed with the a flexible cystoscope. The urethra, bladder neck and entire bladder was visualized in a standard fashion. Small 1cm papillary tumor right lateral wall    ------------------------------------------------------------- 58 year old active smoker with recurrent bladder cancer despite induction BCG x 2.  She continues to smoke, we again discussed the importance of smoking cessation and extensive patient information provided.  I also placed a referral to [facility] for consideration of early cystectomy with her recurrent high-grade disease despite induction BCG x2   We discussed transurethral resection of bladder tumor (TURBT) and risks and benefits at length. This is typically a 1 to 2-hour procedure done under general anesthesia in the operating room.  A scope is inserted through the urethra and used to resect abnormal tissue within the bladder, which is then sent to the pathologist to determine grade and stage of the tumor.  Risks include bleeding, infection, need for temporary Foley placement,  and bladder perforation.  Treatment strategies are based on the type of tumor and depth of invasion.  We briefly reviewed the different treatment pathways for non-muscle invasive and muscle invasive bladder cancer.   Schedule TURBT with gemcitabine , will repeat staging imaging Referral placed to [facility] urology to consider early cystectomy with recurrent high-grade disease despite induction BCG x 2 Consider alternative intravesical treatments if defers early cystectomy  Jay Meth, MD 08/19/2023

## 2023-08-22 LAB — URINE CULTURE: Culture: 80000 — AB

## 2023-08-26 ENCOUNTER — Other Ambulatory Visit: Payer: Self-pay

## 2023-08-26 ENCOUNTER — Ambulatory Visit: Payer: Self-pay | Admitting: Urology

## 2023-08-26 DIAGNOSIS — D494 Neoplasm of unspecified behavior of bladder: Secondary | ICD-10-CM

## 2023-08-26 MED ORDER — NITROFURANTOIN MONOHYD MACRO 100 MG PO CAPS: 100.0000 mg | ORAL_CAPSULE | Freq: Two times a day (BID) | ORAL | 0 refills | Status: DC

## 2023-08-26 NOTE — Telephone Encounter (Signed)
-----   Message from Lawerence Pressman sent at 08/26/2023  8:19 AM EDT ----- Please start nitrofurantoin  100 mg twice daily x 7 days to start 1 week prior to surgery date, thank you  Jay Meth, MD 08/26/2023

## 2023-08-26 NOTE — Telephone Encounter (Signed)
 Spoke with pt. Pt. Advised to start medication on 09/05/2023. Requested medication to be sent in to [facility], which I have done.

## 2023-08-26 NOTE — Progress Notes (Unsigned)
 Surgical Physician Order Form [facility]   Dr. Jay Meth, MD  * Scheduling expectation : Next Available  *Length of Case: 30 minutes  *Clearance needed: no  *Anticoagulation Instructions: Hold all anticoagulants  *Aspirin  Instructions: Hold Aspirin   *Post-op visit Date/Instructions:  tbd  *Diagnosis: Bladder Tumor  *Procedure:  Cysto Bladder Biopsy (02725), gemcitabine    Additional orders: Gemcitabine  2000mg  bladder instillation  -Admit type: OUTpatient  -Anesthesia: General  -VTE Prophylaxis Standing Order SCD's       Other:   -Standing Lab Orders Per Anesthesia    Lab other: UA&Urine Culture sent 5/20  -Standing Test orders EKG/Chest x-ray per Anesthesia       Test other:   - Medications:  cipro  400mg  IV  -Other orders:  N/A

## 2023-08-27 ENCOUNTER — Other Ambulatory Visit: Payer: Self-pay | Admitting: Internal Medicine

## 2023-08-27 ENCOUNTER — Telehealth: Payer: Self-pay

## 2023-08-27 IMAGING — CT CT CHEST LUNG CANCER SCREENING LOW DOSE W/O CM
2 of 5 series · 15 of 40 positions shown, 18 images · non-contrast
Comparison: 09/05/2016.

CLINICAL DATA: Current smoker, 60 pack-year history.

EXAM:
CT CHEST WITHOUT CONTRAST LOW-DOSE FOR LUNG CANCER SCREENING
TECHNIQUE: Multidetector CT imaging of the chest was performed following the
standard protocol without IV contrast.

[Series 3: lung 1.00 · axial · 0.80mm/px · z∈[-1165,-896]mm · 12 of 297 slices shown, 15 images]
[im 14/297  mediastinal]
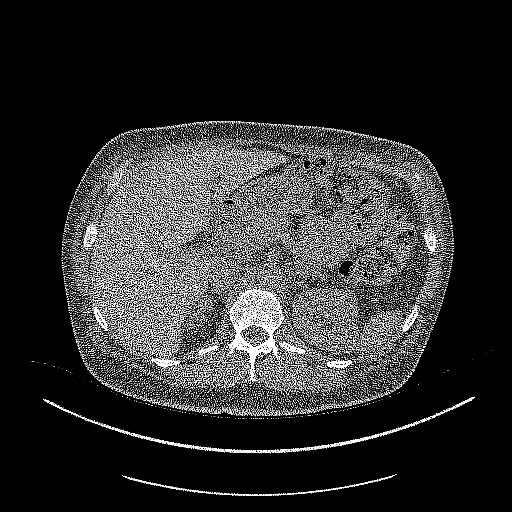
[im 14/297  lung]
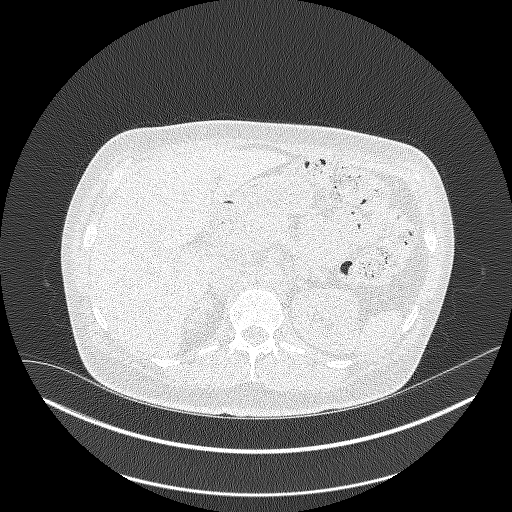
[im 41/297  lung]
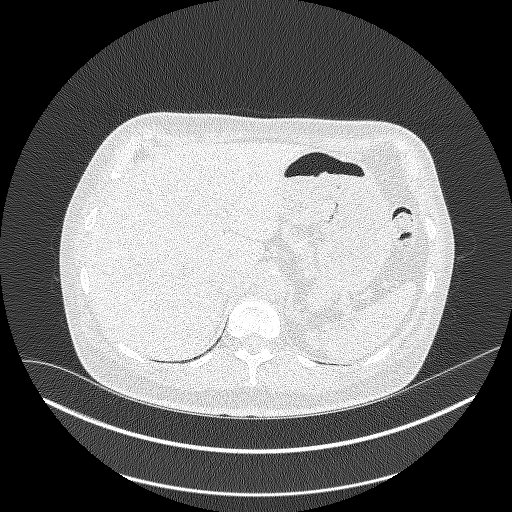
[im 68/297  lung]
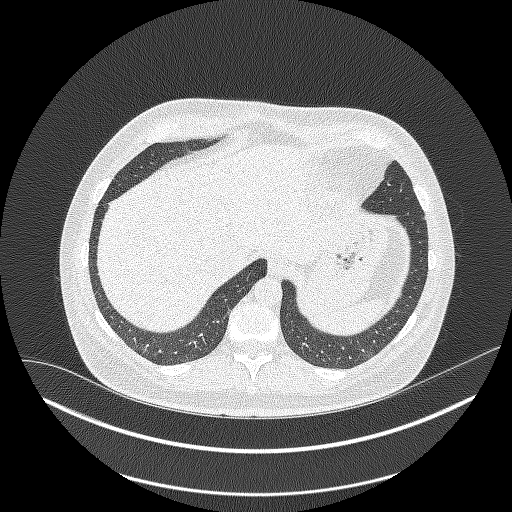
[im 95/297  lung]
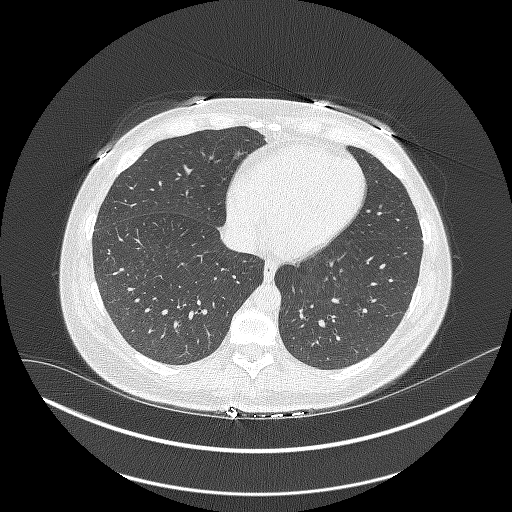
[im 108/297  mediastinal]
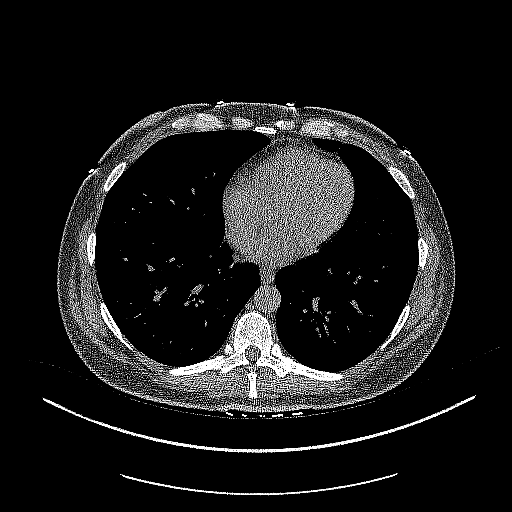
[im 108/297  lung]
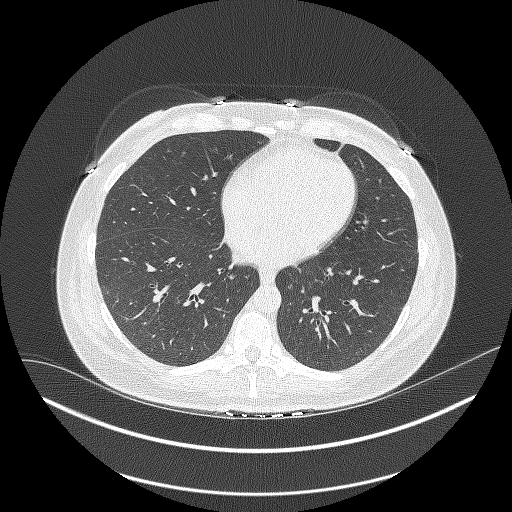
[im 135/297  lung]
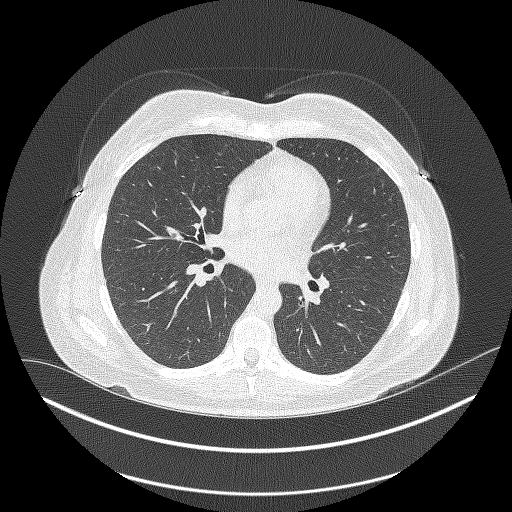
[im 162/297  lung]
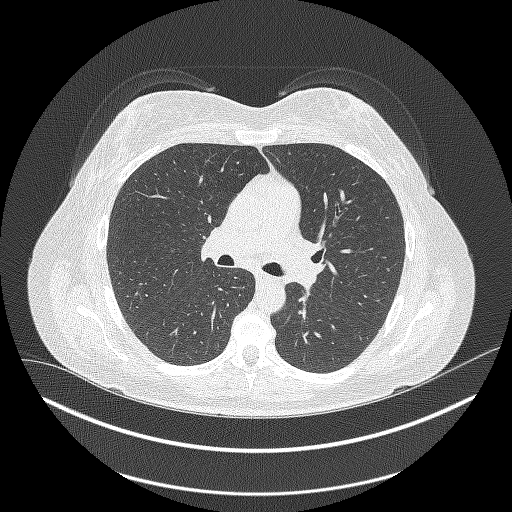
[im 189/297  lung]
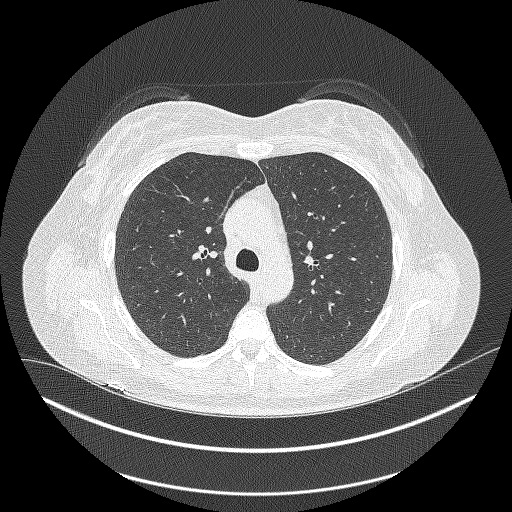
[im 202/297  mediastinal]
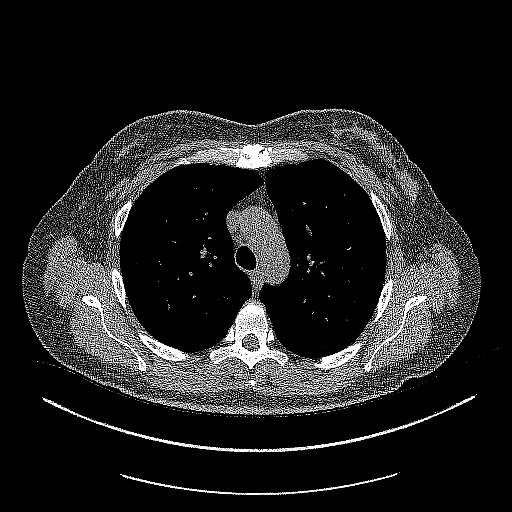
[im 202/297  lung]
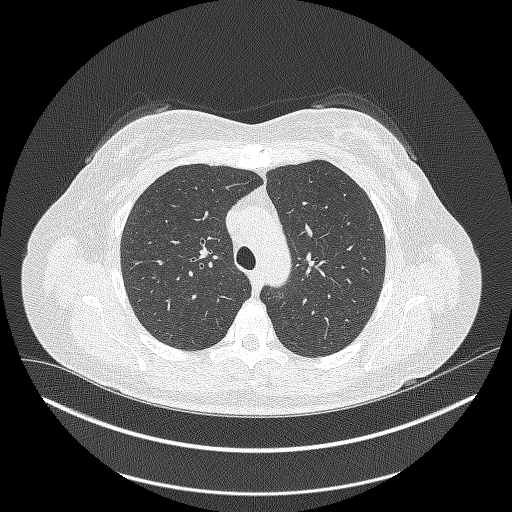
[im 229/297  lung]
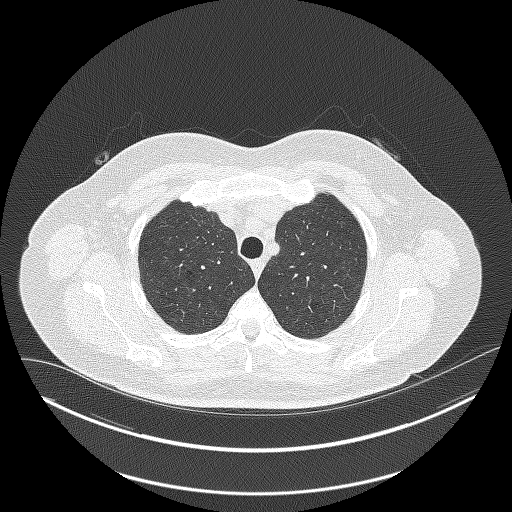
[im 256/297  lung]
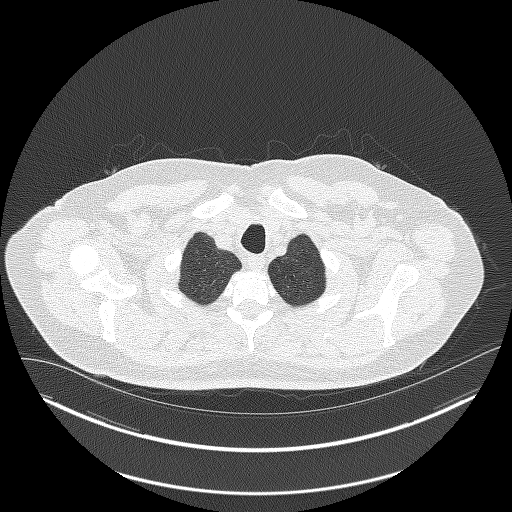
[im 283/297  lung]
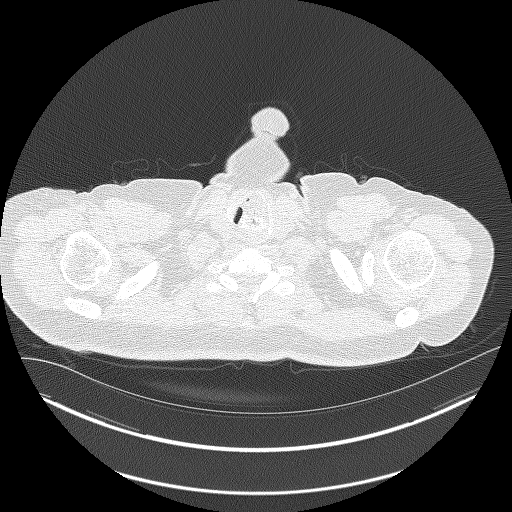

[Series 5: coronals lung 1.00 cor · coronal · 0.58mm/px · 3 of 313 slices shown]
[im 63/313  lung]
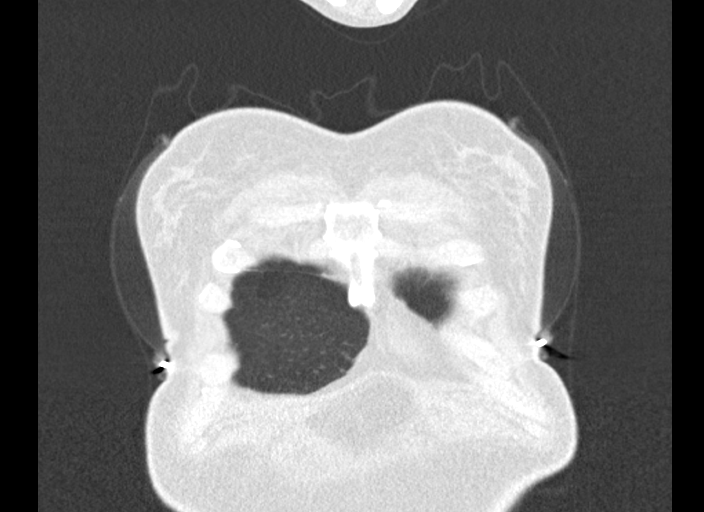
[im 125/313  lung]
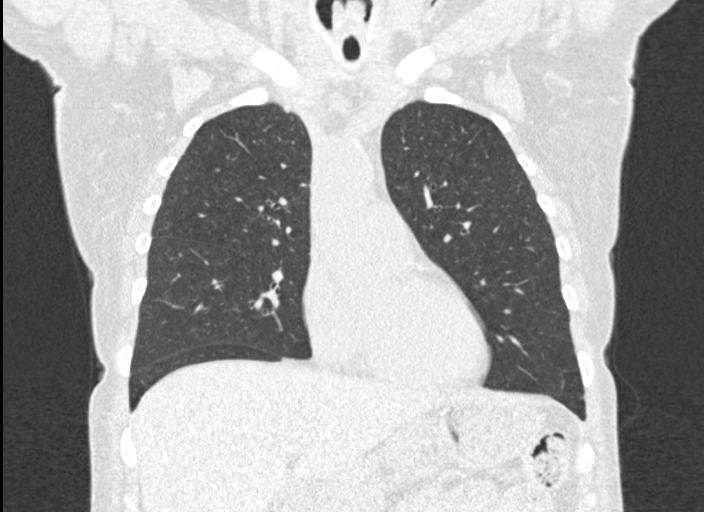
[im 188/313  lung]
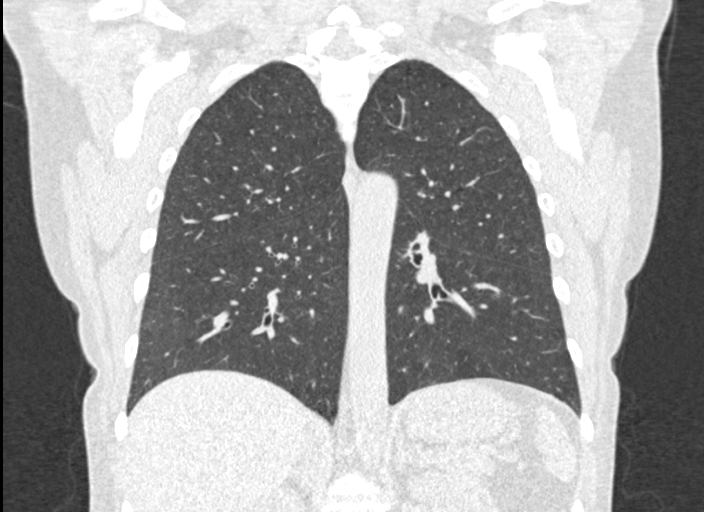

[15 of 40 positions shown; findings below may reference images not displayed]

FINDINGS: Cardiovascular: Heart size normal.  No pericardial effusion.

Mediastinum/Nodes: No pathologically enlarged mediastinal or
axillary lymph nodes. Hilar regions are difficult to evaluate
without IV contrast but appear grossly unremarkable. Esophagus is
grossly unremarkable.

Lungs/Pleura: Centrilobular emphysema. Smoking related respiratory
bronchiolitis. 4.2 mm left upper lobe nodule is unchanged. No
suspicious pulmonary nodules. No pleural fluid. Minimal debris in
the airway.

Upper Abdomen: Visualized portions of the liver, adrenal glands,
kidneys, spleen, pancreas, stomach and bowel are grossly
unremarkable.

Musculoskeletal: No worrisome lytic or sclerotic lesions.
IMPRESSION: 1. Lung-RADS 2, benign appearance or behavior. Continue annual
screening with low-dose chest CT without contrast in 12 months.
2.  Emphysema ([id number]).

## 2023-08-27 NOTE — Progress Notes (Signed)
   [facility]- Surgical Posting Form  Surgery Date: Date: 09/12/2023  Surgeon: Dr. Jay Meth, MD  Inpt ( No  )   Outpt (Yes)   Obs ( No  )   Diagnosis: D49.4 Bladder Tumor  -CPT: 40981, [phone]  Surgery: Cystoscopy with Bladder Biopsy and Intravesical Instillation of Gemcitabine   Stop Anticoagulations: Yes and also hold ASA for 3 days  Cardiac/Medical/Pulmonary Clearance needed: no  *Orders entered into EPIC  Date: 08/27/23   *Case booked in [place]  Date: 08/26/2023  *Notified pt of Surgery: Date: 08/26/2023  PRE-OP UA & CX: yes, obtained in clinic and sent on 08/19/2023  *Placed into Prior Authorization Work [place] Date: 08/27/23  Assistant/laser/rep:No  Patient aware to hold Ozempic for at least ONE WEEK prior to surgery.

## 2023-08-27 NOTE — Telephone Encounter (Signed)
 Per Dr. Estanislao Heimlich, Patient is to be scheduled for Cystoscopy with Bladder Biopsy and Intravesical Instillation of Gemcitabine    Mrs. Brandi Calderon was contacted and possible surgical dates were discussed, Friday June 13th, 2025 was agreed upon for surgery.   Patient was directed to call [phone] between 1-3pm the day before surgery to find out surgical arrival time.  Instructions were given not to eat or drink from midnight on the night before surgery and have a driver for the day of surgery. On the surgery day patient was instructed to enter through the Medical Mall entrance of [facility] report the Same Day Surgery desk.   Pre-Admit Testing will be in contact via phone to set up an interview with the anesthesia team to review your history and medications prior to surgery.   Reminder of this information was sent via MyChart to the patient.

## 2023-08-28 ENCOUNTER — Other Ambulatory Visit: Payer: Self-pay | Admitting: Internal Medicine

## 2023-08-28 DIAGNOSIS — K219 Gastro-esophageal reflux disease without esophagitis: Secondary | ICD-10-CM

## 2023-08-28 NOTE — Patient Outreach (Signed)
 Brief outreach with Ms. Avants. She was unable to complete complex care management outreach today. Reports receiving a call indicating her cancer has returned. Reports pending multiple calls from the surgical and Urology teams to confirm plan for surgery.   Denies urgent care management needs. Agreeable to outreach after her anticipated surgery date in mid-June. Agreed to call with questions if needed or with urgent care management needs.     Roxie Cord [facility] RN Care Manager Direct Dial: [phone]  Fax: [phone] Website: [email or web address]

## 2023-08-29 NOTE — Telephone Encounter (Signed)
 Requested by interface surescripts. Last OV 04/03/23. Future visit in 1 month.  Requested Prescriptions  Pending Prescriptions Disp Refills   pantoprazole  (PROTONIX ) 40 MG tablet [Pharmacy Med Name: PANTOPRAZOLE  SOD DR 40 MG TAB] 90 tablet 1    Sig: TAKE 1 TABLET (40 MG TOTAL) BY MOUTH DAILY AS NEEDED (HEARTBURN).     Gastroenterology: Proton Pump Inhibitors Failed - 08/29/2023  3:52 PM      Failed - Valid encounter within last 12 months    Recent Outpatient Visits   None     Future Appointments             In 1 month Brandi Calderon, Brandi Buzzard, NP [facility], [facility]

## 2023-08-29 NOTE — Telephone Encounter (Signed)
 Requested medications are due for refill today.  yes  Requested medications are on the active medications list.  yes  Last refill. 07/25/2023 #30 0 rf  Future visit scheduled.   yes  Notes to clinic.  Refill not delegated.   Requested Prescriptions  Pending Prescriptions Disp Refills   clonazePAM  (KLONOPIN ) 0.5 MG tablet [Pharmacy Med Name: CLONAZEPAM  0.5 MG TABLET] 30 tablet 0    Sig: TAKE 1 TABLET BY MOUTH EVERY DAY AS NEEDED FOR ANXIETY     Not Delegated - Psychiatry: Anxiolytics/Hypnotics 2 Failed - 08/29/2023 11:42 AM      Failed - This refill cannot be delegated      Failed - Urine Drug Screen completed in last 360 days      Failed - Valid encounter within last 6 months    Recent Outpatient Visits   None     Future Appointments             In 1 month [name], Rankin Buzzard, NP [facility], [facility]            Passed - Patient is not pregnant

## 2023-09-04 ENCOUNTER — Encounter
Admission: RE | Admit: 2023-09-04 | Discharge: 2023-09-04 | Disposition: A | Discharge status: Home / Self Care | Source: Ambulatory Visit | Attending: Urology | Admitting: Urology

## 2023-09-04 ENCOUNTER — Other Ambulatory Visit: Payer: Self-pay

## 2023-09-04 VITALS — Ht 62.0 in | Wt 137.0 lb

## 2023-09-04 DIAGNOSIS — E1169 Type 2 diabetes mellitus with other specified complication: Secondary | ICD-10-CM

## 2023-09-04 DIAGNOSIS — J9611 Chronic respiratory failure with hypoxia: Secondary | ICD-10-CM

## 2023-09-04 DIAGNOSIS — J449 Chronic obstructive pulmonary disease, unspecified: Secondary | ICD-10-CM

## 2023-09-04 DIAGNOSIS — E1142 Type 2 diabetes mellitus with diabetic polyneuropathy: Secondary | ICD-10-CM

## 2023-09-04 DIAGNOSIS — F17200 Nicotine dependence, unspecified, uncomplicated: Secondary | ICD-10-CM

## 2023-09-04 DIAGNOSIS — I700 Atherosclerosis of aorta: Secondary | ICD-10-CM

## 2023-09-04 DIAGNOSIS — I2510 Atherosclerotic heart disease of native coronary artery without angina pectoris: Secondary | ICD-10-CM

## 2023-09-04 NOTE — Patient Instructions (Addendum)
 Your procedure is scheduled on: Friday 09/12/23 To find out your arrival time, please call [phone] between 1PM - 3PM on: Thursday 09/11/23   Report to the Registration Desk on the 1st floor of the Medical Mall. Free Valet parking is available.  If your arrival time is 6:00 am, do not arrive before that time as the Medical Mall entrance doors do not open until 6:00 am.  REMEMBER: Instructions that are not followed completely may result in serious medical risk, up to and including death; or upon the discretion of your surgeon and anesthesiologist your surgery may need to be rescheduled.  Do not eat food or drink any liquids after midnight the night before surgery.  No gum chewing or hard candies.  One week prior to surgery: Stop Anti-inflammatories (NSAIDS) such as Advil , Aleve , Ibuprofen , Motrin , Naproxen , Naprosyn  and Aspirin  based products such as Excedrin, Goody's Powder, BC Powder. You may however, continue to take Tylenol  if needed for pain up until the day of surgery.  Stop ANY OVER THE COUNTER supplements and vitamins until after surgery.  Continue taking all prescribed medications with the exception of the following: Hold Aspirin  for 3 days as instructed with last day taken Monday 09/08/23. Metformin , last dose taken Tuesday 09/09/23. Ozempic skip Friday 09/05/23 dose  TAKE ONLY THESE MEDICATIONS THE MORNING OF SURGERY WITH A SIP OF WATER :  pantoprazole  (PROTONIX ) 40 MG tablet Antacid (take one the night before and one on the morning of surgery - helps to prevent nausea after surgery.) oxybutynin  (DITROPAN  XL) 15 MG 24 hr tablet   Use inhalers on the day of surgery  (Trellegy) and bring (Albuterol ) to the hospital.  No Alcohol for 24 hours before or after surgery.  No Smoking including e-cigarettes for 24 hours before surgery.  No chewable tobacco products for at least 6 hours before surgery.  No nicotine  patches on the day of surgery.  Do not use any "recreational" drugs for  at least a week (preferably 2 weeks) before your surgery.  Please be advised that the combination of cocaine and anesthesia may have negative outcomes, up to and including death. If you test positive for cocaine, your surgery will be cancelled.  On the morning of surgery brush your teeth with toothpaste and water , you may rinse your mouth with mouthwash if you wish. Do not swallow any toothpaste or mouthwash.  Shower before arrival.  Do not wear lotions, powders, or perfumes.   Do not shave body hair from the neck down 48 hours before surgery.  Wear comfortable clothing (specific to your surgery type) to the hospital.  Do not wear jewelry, make-up, hairpins, clips or nail polish.  For welded (permanent) jewelry: bracelets, anklets, waist bands, etc.  Please have this removed prior to surgery.  If it is not removed, there is a chance that hospital personnel will need to cut it off on the day of surgery. Contact lenses, hearing aids and dentures may not be worn into surgery.  Do not bring valuables to the hospital. [facility] is not responsible for any missing/lost belongings or valuables.   Notify your doctor if there is any change in your medical condition (cold, fever, infection).  If you are being discharged the day of surgery, you will not be allowed to drive home. You will need a responsible individual to drive you home and stay with you for 24 hours after surgery.   After surgery, you can help prevent lung complications by doing breathing exercises.  Take deep  breaths and cough every 1-2 hours. Your doctor may order a device called an Incentive Spirometer to help you take deep breaths.  Surgery Visitation Policy:  Patients undergoing a surgery or procedure may have two family members or support persons with them as long as the person is not COVID-19 positive or experiencing its symptoms.   Please call the Pre-admissions Testing Dept. at [phone] if you have any questions  about these instructions.

## 2023-09-08 ENCOUNTER — Encounter
Admission: RE | Admit: 2023-09-08 | Discharge: 2023-09-08 | Disposition: A | Discharge status: Home / Self Care | Source: Ambulatory Visit | Attending: Urology | Admitting: Urology

## 2023-09-08 DIAGNOSIS — E1169 Type 2 diabetes mellitus with other specified complication: Secondary | ICD-10-CM | POA: Insufficient documentation

## 2023-09-08 DIAGNOSIS — F17200 Nicotine dependence, unspecified, uncomplicated: Secondary | ICD-10-CM | POA: Insufficient documentation

## 2023-09-08 DIAGNOSIS — J449 Chronic obstructive pulmonary disease, unspecified: Secondary | ICD-10-CM | POA: Diagnosis not present

## 2023-09-08 DIAGNOSIS — I700 Atherosclerosis of aorta: Secondary | ICD-10-CM

## 2023-09-08 DIAGNOSIS — E1142 Type 2 diabetes mellitus with diabetic polyneuropathy: Secondary | ICD-10-CM | POA: Diagnosis not present

## 2023-09-08 DIAGNOSIS — Z01818 Encounter for other preprocedural examination: Secondary | ICD-10-CM | POA: Diagnosis present

## 2023-09-08 DIAGNOSIS — I2510 Atherosclerotic heart disease of native coronary artery without angina pectoris: Secondary | ICD-10-CM | POA: Diagnosis not present

## 2023-09-08 DIAGNOSIS — E785 Hyperlipidemia, unspecified: Secondary | ICD-10-CM | POA: Insufficient documentation

## 2023-09-08 DIAGNOSIS — I2584 Coronary atherosclerosis due to calcified coronary lesion: Secondary | ICD-10-CM | POA: Diagnosis not present

## 2023-09-08 DIAGNOSIS — Z01810 Encounter for preprocedural cardiovascular examination: Secondary | ICD-10-CM | POA: Diagnosis not present

## 2023-09-08 LAB — BASIC METABOLIC PANEL WITH GFR
Anion gap: 10 (ref 5–15)
BUN: 19 mg/dL (ref 6–20)
CO2: 26 mmol/L (ref 22–32)
Calcium: 9.1 mg/dL (ref 8.9–10.3)
Chloride: 105 mmol/L (ref 98–111)
Creatinine, Ser: 0.65 mg/dL (ref 0.44–1.00)
GFR, Estimated: >60 mL/min (ref >60)
Glucose, Bld: 220 mg/dL — ABNORMAL HIGH (ref 70–99)
Potassium: 3.8 mmol/L (ref 3.5–5.1)
Sodium: 141 mmol/L (ref 135–145)

## 2023-09-08 LAB — CBC
HCT: 37.3 % (ref 36.0–46.0)
Hemoglobin: 12.6 g/dL (ref 12.0–15.0)
MCH: 28.2 pg (ref 26.0–34.0)
MCHC: 33.8 g/dL (ref 30.0–36.0)
MCV: 83.4 fL (ref 80.0–100.0)
Platelets: 245 10*3/uL (ref 150–400)
RBC: 4.47 MIL/uL (ref 3.87–5.11)
RDW: 16.3 % — ABNORMAL HIGH (ref 11.5–15.5)
WBC: 8.8 10*3/uL (ref 4.0–10.5)
nRBC: 0 % (ref 0.0–0.2)

## 2023-09-12 ENCOUNTER — Other Ambulatory Visit: Payer: Self-pay

## 2023-09-12 ENCOUNTER — Ambulatory Visit: Admitting: Certified Registered"

## 2023-09-12 ENCOUNTER — Encounter: Payer: Self-pay | Admitting: Urology

## 2023-09-12 ENCOUNTER — Encounter
Admission: RE | Disposition: A | Discharge status: Home / Self Care | Payer: Self-pay | Source: Home / Self Care | Attending: Urology

## 2023-09-12 ENCOUNTER — Ambulatory Visit
Admission: RE | Admit: 2023-09-12 | Discharge: 2023-09-12 | Disposition: A | Discharge status: Home / Self Care | Attending: Urology | Admitting: Urology

## 2023-09-12 DIAGNOSIS — C672 Malignant neoplasm of lateral wall of bladder: Secondary | ICD-10-CM | POA: Diagnosis present

## 2023-09-12 DIAGNOSIS — G4730 Sleep apnea, unspecified: Secondary | ICD-10-CM | POA: Diagnosis not present

## 2023-09-12 DIAGNOSIS — E66813 Obesity, class 3: Secondary | ICD-10-CM | POA: Diagnosis not present

## 2023-09-12 DIAGNOSIS — I700 Atherosclerosis of aorta: Secondary | ICD-10-CM

## 2023-09-12 DIAGNOSIS — Z7984 Long term (current) use of oral hypoglycemic drugs: Secondary | ICD-10-CM | POA: Insufficient documentation

## 2023-09-12 DIAGNOSIS — E119 Type 2 diabetes mellitus without complications: Secondary | ICD-10-CM | POA: Insufficient documentation

## 2023-09-12 DIAGNOSIS — E1169 Type 2 diabetes mellitus with other specified complication: Secondary | ICD-10-CM

## 2023-09-12 DIAGNOSIS — D494 Neoplasm of unspecified behavior of bladder: Secondary | ICD-10-CM

## 2023-09-12 DIAGNOSIS — I2510 Atherosclerotic heart disease of native coronary artery without angina pectoris: Secondary | ICD-10-CM | POA: Insufficient documentation

## 2023-09-12 DIAGNOSIS — J4489 Other specified chronic obstructive pulmonary disease: Secondary | ICD-10-CM | POA: Diagnosis not present

## 2023-09-12 DIAGNOSIS — C679 Malignant neoplasm of bladder, unspecified: Secondary | ICD-10-CM

## 2023-09-12 DIAGNOSIS — F17210 Nicotine dependence, cigarettes, uncomplicated: Secondary | ICD-10-CM | POA: Insufficient documentation

## 2023-09-12 DIAGNOSIS — J449 Chronic obstructive pulmonary disease, unspecified: Secondary | ICD-10-CM

## 2023-09-12 DIAGNOSIS — J9611 Chronic respiratory failure with hypoxia: Secondary | ICD-10-CM

## 2023-09-12 DIAGNOSIS — Z6825 Body mass index (BMI) 25.0-25.9, adult: Secondary | ICD-10-CM | POA: Insufficient documentation

## 2023-09-12 DIAGNOSIS — F17200 Nicotine dependence, unspecified, uncomplicated: Secondary | ICD-10-CM

## 2023-09-12 HISTORY — PX: BLADDER INSTILLATION: SHX6893

## 2023-09-12 HISTORY — PX: CYSTOSCOPY WITH BIOPSY: SHX5122

## 2023-09-12 LAB — GLUCOSE, CAPILLARY
Glucose-Capillary: 139 mg/dL — ABNORMAL HIGH (ref 70–99)
Glucose-Capillary: 172 mg/dL — ABNORMAL HIGH (ref 70–99)

## 2023-09-12 SURGERY — CYSTOSCOPY, WITH BIOPSY
Anesthesia: General

## 2023-09-12 MED ORDER — ORAL CARE MOUTH RINSE
15.0000 mL | Freq: Once | OROMUCOSAL | Status: AC
Start: 2023-09-12 — End: 2023-09-12

## 2023-09-12 MED ORDER — LIDOCAINE HCL (CARDIAC) PF 100 MG/5ML IV SOSY
PREFILLED_SYRINGE | INTRAVENOUS | Status: DC | PRN
  Administered 2023-09-12: 100 mg via INTRAVENOUS

## 2023-09-12 MED ORDER — TRAMADOL HCL 50 MG PO TABS: 25.0000 mg | ORAL_TABLET | Freq: Four times a day (QID) | ORAL | 0 refills | Status: AC | PRN

## 2023-09-12 MED ORDER — SUGAMMADEX SODIUM 200 MG/2ML IV SOLN
INTRAVENOUS | Status: DC | PRN
  Administered 2023-09-12: 200 mg via INTRAVENOUS

## 2023-09-12 MED ORDER — CIPROFLOXACIN IN D5W 400 MG/200ML IV SOLN
400.0000 mg | INTRAVENOUS | Status: AC
  Administered 2023-09-12: 400 mg via INTRAVENOUS

## 2023-09-12 MED ORDER — GEMCITABINE CHEMO FOR BLADDER INSTILLATION 2000 MG
2000.0000 mg | Freq: Once | INTRAVENOUS | Status: AC
  Administered 2023-09-12: 2000 mg via INTRAVESICAL
  Filled 2023-09-12: qty 2000

## 2023-09-12 MED ORDER — CEFAZOLIN SODIUM-DEXTROSE 2-4 GM/100ML-% IV SOLN
INTRAVENOUS | Status: AC
  Filled 2023-09-12: qty 100

## 2023-09-12 MED ORDER — FENTANYL CITRATE (PF) 100 MCG/2ML IJ SOLN
INTRAMUSCULAR | Status: AC
  Filled 2023-09-12: qty 2

## 2023-09-12 MED ORDER — ONDANSETRON HCL 4 MG/2ML IJ SOLN: 4.0000 mg | Freq: Once | INTRAMUSCULAR | Status: DC | PRN

## 2023-09-12 MED ORDER — MIDAZOLAM HCL 2 MG/2ML IJ SOLN
INTRAMUSCULAR | Status: AC
  Filled 2023-09-12: qty 2

## 2023-09-12 MED ORDER — FENTANYL CITRATE (PF) 100 MCG/2ML IJ SOLN: 25.0000 ug | INTRAMUSCULAR | Status: DC | PRN

## 2023-09-12 MED ORDER — CIPROFLOXACIN IN D5W 400 MG/200ML IV SOLN
INTRAVENOUS | Status: AC
  Filled 2023-09-12: qty 200

## 2023-09-12 MED ORDER — ONDANSETRON HCL 4 MG/2ML IJ SOLN
INTRAMUSCULAR | Status: DC | PRN
Start: 2023-09-12 — End: 2023-09-12
  Administered 2023-09-12: 4 mg via INTRAVENOUS

## 2023-09-12 MED ORDER — SODIUM CHLORIDE 0.9 % IV SOLN: INTRAVENOUS | Status: DC

## 2023-09-12 MED ORDER — FENTANYL CITRATE (PF) 100 MCG/2ML IJ SOLN
INTRAMUSCULAR | Status: DC | PRN
  Administered 2023-09-12 (×2): 50 ug via INTRAVENOUS

## 2023-09-12 MED ORDER — PROPOFOL 10 MG/ML IV BOLUS
INTRAVENOUS | Status: DC | PRN
  Administered 2023-09-12: 170 mg via INTRAVENOUS

## 2023-09-12 MED ORDER — MIDAZOLAM HCL 2 MG/2ML IJ SOLN
INTRAMUSCULAR | Status: DC | PRN
  Administered 2023-09-12: 2 mg via INTRAVENOUS

## 2023-09-12 MED ORDER — IPRATROPIUM-ALBUTEROL 0.5-2.5 (3) MG/3ML IN SOLN
RESPIRATORY_TRACT | Status: AC
  Filled 2023-09-12: qty 3

## 2023-09-12 MED ORDER — SODIUM CHLORIDE 0.9 % IR SOLN
Status: DC | PRN
  Administered 2023-09-12: 1 via INTRAVESICAL

## 2023-09-12 MED ORDER — IPRATROPIUM-ALBUTEROL 0.5-2.5 (3) MG/3ML IN SOLN
3.0000 mL | Freq: Once | RESPIRATORY_TRACT | Status: AC
  Administered 2023-09-12: 3 mL via RESPIRATORY_TRACT

## 2023-09-12 MED ORDER — GLYCOPYRROLATE 0.2 MG/ML IJ SOLN
INTRAMUSCULAR | Status: DC | PRN
  Administered 2023-09-12: .2 mg via INTRAVENOUS

## 2023-09-12 MED ORDER — CHLORHEXIDINE GLUCONATE 0.12 % MT SOLN
15.0000 mL | Freq: Once | OROMUCOSAL | Status: AC
  Administered 2023-09-12: 15 mL via OROMUCOSAL

## 2023-09-12 MED ORDER — CHLORHEXIDINE GLUCONATE 0.12 % MT SOLN
OROMUCOSAL | Status: AC
  Filled 2023-09-12: qty 15

## 2023-09-12 MED ORDER — ROCURONIUM BROMIDE 100 MG/10ML IV SOLN
INTRAVENOUS | Status: DC | PRN
  Administered 2023-09-12: 50 mg via INTRAVENOUS

## 2023-09-12 SURGICAL SUPPLY — 16 items
BAG DRAIN SIEMENS DORNER NS (MISCELLANEOUS) ×1 IMPLANT
BRUSH SCRUB EZ 4% CHG (MISCELLANEOUS) ×1 IMPLANT
CATH FOLEY 2WAY 18X30 (CATHETERS) ×1 IMPLANT
DRSG TELFA 3X4 N-ADH STERILE (GAUZE/BANDAGES/DRESSINGS) ×1 IMPLANT
ELECTRODE LOOP 22F BIPOLAR SML (ELECTROSURGICAL) IMPLANT
ELECTRODE REM PT RTRN 9FT ADLT (ELECTROSURGICAL) ×1 IMPLANT
GLOVE BIOGEL PI IND STRL 7.5 (GLOVE) ×1 IMPLANT
GOWN STRL REUS W/ TWL LRG LVL3 (GOWN DISPOSABLE) ×1 IMPLANT
GOWN STRL REUS W/ TWL XL LVL3 (GOWN DISPOSABLE) ×1 IMPLANT
KIT TURNOVER CYSTO (KITS) ×1 IMPLANT
PACK CYSTO AR (MISCELLANEOUS) ×1 IMPLANT
SET CYSTO W/LG BORE CLAMP LF (SET/KITS/TRAYS/PACK) ×1 IMPLANT
SURGILUBE 2OZ TUBE FLIPTOP (MISCELLANEOUS) ×1 IMPLANT
WATER STERILE IRR 1000ML POUR (IV SOLUTION) ×1 IMPLANT
WATER STERILE IRR 3000ML UROMA (IV SOLUTION) ×1 IMPLANT
WATER STERILE IRR 500ML POUR (IV SOLUTION) ×1 IMPLANT

## 2023-09-12 NOTE — Op Note (Signed)
 Date of procedure: 09/12/23  Preoperative diagnosis:  Bladder cancer  Postoperative diagnosis:  Same  Procedure: Cystoscopy, bladder biopsy and fulguration(5 mm tumor) Intravesical instillation of gemcitabine   Surgeon: Jay Meth, MD  Anesthesia: General  Complications: None  Intraoperative findings:  5 mm papillary tumor right lateral wall, subtle papillary change at dome fulgurated Excellent hemostasis Intravesical gemcitabine  instilled  EBL: Minimal  Specimens: Biopsy  Drains: 16 French Foley  Indication: Brandi Calderon is a 58 y.o. patient with recurrent high-grade bladder cancer, ongoing smoker found to have recent small recurrence in clinic.  After reviewing the management options for treatment, they elected to proceed with the above surgical procedure(s). We have discussed the potential benefits and risks of the procedure, side effects of the proposed treatment, the likelihood of the patient achieving the goals of the procedure, and any potential problems that might occur during the procedure or recuperation. Informed consent has been obtained.  Description of procedure:  The patient was taken to the operating room and general anesthesia was induced. SCDs were placed for DVT prophylaxis. The patient was placed in the dorsal lithotomy position, prepped and draped in the usual sterile fashion, and preoperative antibiotics(Cipro ) were administered. A preoperative time-out was performed.   A 26 French resectoscope was inserted into the bladder and thorough cystoscopy showed ureteral orifices orthotopic bilaterally, extensive scar throughout the bladder from prior TURBT, 5 mm papillary tumor right lateral wall, and some very subtle early papillary change at the dome worrisome for early recurrence.  The cold cup biopsy forcep was used to remove the 5 mm papillary tumor in its entirety.  The small bipolar loop was used to achieve meticulous hemostasis at the base of the  tumor.  The loop was used to fulgurate the small papillary change at the dome.  With the bladder decompressed there was excellent hemostasis.  A 16 French Foley was passed into the bladder, 10 mL placed in the balloon, and irrigated with 300 mL sterile water .  Bladder was drained, and 2 g / 50 mL gemcitabine  were instilled into the catheter and the catheter clamped.  Disposition: Stable to PACU  Plan: -Unclamp Foley at 12:05 PM and allow gemcitabine  to drain -Foley can be removed prior to discharge -Will call with pathology result -Referral has been placed to [facility] urology to consider early cystectomy with recurrent high-grade BCG refractory disease  Jay Meth, MD

## 2023-09-12 NOTE — Anesthesia Procedure Notes (Signed)
 Procedure Name: Intubation Date/Time: 09/12/2023 10:52 AM  Performed by: Niki Barter, CRNAPre-anesthesia Checklist: Patient identified, Emergency Drugs available, Suction available and Patient being monitored Patient Re-evaluated:Patient Re-evaluated prior to induction Oxygen  Delivery Method: Circle system utilized Preoxygenation: Pre-oxygenation with 100% oxygen  Induction Type: IV induction Ventilation: Mask ventilation without difficulty Laryngoscope Size: McGrath and 3 Grade View: Grade I Tube type: Oral Number of attempts: 1 Airway Equipment and Method: Stylet Placement Confirmation: ETT inserted through vocal cords under direct vision, positive ETCO2 and breath sounds checked- equal and bilateral Secured at: 21 cm Tube secured with: Tape Dental Injury: Teeth and Oropharynx as per pre-operative assessment

## 2023-09-12 NOTE — Anesthesia Preprocedure Evaluation (Signed)
 Anesthesia Evaluation  Patient identified by MRN, date of birth, ID band Patient awake    Reviewed: Allergy & Precautions, NPO status , Patient's Chart, lab work & pertinent test results  Airway Mallampati: II  TM Distance: >3 FB Neck ROM: full    Dental  (+) Upper Dentures, Lower Dentures   Pulmonary neg pulmonary ROS, asthma , sleep apnea , COPD,  COPD inhaler, Current Smoker   Pulmonary exam normal  + decreased breath sounds      Cardiovascular Exercise Tolerance: Good Pt. on medications + CAD  negative cardio ROS Normal cardiovascular exam Rhythm:Regular Rate:Normal     Neuro/Psych  Headaches negative neurological ROS  negative psych ROS   GI/Hepatic negative GI ROS, Neg liver ROS,GERD  Medicated,,  Endo/Other  negative endocrine ROSdiabetes, Type 2, Oral Hypoglycemic Agents  Class 3 obesity  Renal/GU   negative genitourinary   Musculoskeletal   Abdominal   Peds negative pediatric ROS (+)  Hematology negative hematology ROS (+)   Anesthesia Other Findings Past Medical History: No date: Anxiety No date: Aortic atherosclerosis (HCC) No date: Arthritis No date: Asthma No date: Back pain No date: Bladder cancer (HCC) No date: Bladder tumor No date: COPD (chronic obstructive pulmonary disease) (HCC) No date: Depression No date: GERD (gastroesophageal reflux disease) No date: Headache No date: History of COVID-19 No date: Hyperlipidemia No date: Hypertension No date: Neuromuscular disorder (HCC) No date: Sleep apnea No date: Tendonitis No date: Type 2 diabetes mellitus without complication (HCC) 05/22/2022: Urinary tract infection due to extended-spectrum beta  lactamase (ESBL) producing Escherichia coli  Past Surgical History: 01/03/2023: BLADDER INSTILLATION; N/A     Comment:  Procedure: BLADDER INSTILLATION OF GEMCITABINE ;                Surgeon: Lawerence Pressman, MD;  Location: [facility] ORS;                 Service: Urology;  Laterality: N/A; No date: CESAREAN SECTION     Comment:  3 times No date: COLONOSCOPY No date: cyst on neck     Comment:  as a baby/right side 07/26/2022: CYSTOSCOPY WITH BIOPSY; N/A     Comment:  Procedure: CYSTOSCOPY WITH BLADDER BIOPSY;  Surgeon:               Lawerence Pressman, MD;  Location: [facility] ORS;  Service:               Urology;  Laterality: N/A; 06/07/2022: TRANSURETHRAL RESECTION OF BLADDER TUMOR; N/A     Comment:  Procedure: TRANSURETHRAL RESECTION OF BLADDER TUMOR               (TURBT);  Surgeon: Lawerence Pressman, MD;  Location: [facility]               ORS;  Service: Urology;  Laterality: N/A; 01/03/2023: TRANSURETHRAL RESECTION OF BLADDER TUMOR; N/A     Comment:  Procedure: TRANSURETHRAL RESECTION OF BLADDER TUMOR               (TURBT);  Surgeon: Lawerence Pressman, MD;  Location: [facility]               ORS;  Service: Urology;  Laterality: N/A;     Reproductive/Obstetrics negative OB ROS                             Anesthesia Physical Anesthesia Plan  ASA: 3  Anesthesia Plan: General  Post-op Pain Management:    Induction: Intravenous  PONV Risk Score and Plan: Ondansetron , Dexamethasone , Midazolam  and Treatment may vary due to age or medical condition  Airway Management Planned: Oral ETT  Additional Equipment:   Intra-op Plan:   Post-operative Plan: Extubation in OR  Informed Consent: I have reviewed the patients History and Physical, chart, labs and discussed the procedure including the risks, benefits and alternatives for the proposed anesthesia with the patient or authorized representative who has indicated his/her understanding and acceptance.     Dental Advisory Given  Plan Discussed with: CRNA  Anesthesia Plan Comments:        Anesthesia Quick Evaluation

## 2023-09-12 NOTE — H&P (Signed)
 09/12/23 10:04 AM   Brandi Calderon 15-Jun-1965 [id number]  CC: Bladder cancer  HPI: 58 year old smoker who originally presented in March 2024 with large 5+ centimeter bladder tumor, pathology showed HG T1.  She underwent second look TURBT as well as induction BCG, initial recurrence in October 2024 with multiple small papillary tumors and underwent TURBT with gemcitabine .  Underwent repeat induction BCG December 2024.  Recently found to have a small papillary 1 cm recurrence on clinic cystoscopy.  Referral has been placed to [facility] for consideration of early cystectomy with her high-grade recurrent disease.   PMH: Past Medical History:  Diagnosis Date   Anxiety    Aortic atherosclerosis (HCC)    Arthritis    Asthma    Back pain    Bladder cancer (HCC)    Bladder tumor    COPD (chronic obstructive pulmonary disease) (HCC)    Depression    GERD (gastroesophageal reflux disease)    Headache    History of COVID-19    Hyperlipidemia    Hypertension    Neuromuscular disorder (HCC)    Sleep apnea    Tendonitis    Type 2 diabetes mellitus without complication (HCC)    Urinary tract infection due to extended-spectrum beta lactamase (ESBL) producing Escherichia coli 05/22/2022    Surgical History: Past Surgical History:  Procedure Laterality Date   BLADDER INSTILLATION N/A 01/03/2023   Procedure: BLADDER INSTILLATION OF GEMCITABINE ;  Surgeon: Lawerence Pressman, MD;  Location: [facility] ORS;  Service: Urology;  Laterality: N/A;   CESAREAN SECTION     3 times   COLONOSCOPY     cyst on neck     as a baby/right side   CYSTOSCOPY WITH BIOPSY N/A 07/26/2022   Procedure: CYSTOSCOPY WITH BLADDER BIOPSY;  Surgeon: Lawerence Pressman, MD;  Location: [facility] ORS;  Service: Urology;  Laterality: N/A;   TRANSURETHRAL RESECTION OF BLADDER TUMOR N/A 06/07/2022   Procedure: TRANSURETHRAL RESECTION OF BLADDER TUMOR (TURBT);  Surgeon: Lawerence Pressman, MD;  Location: [facility] ORS;  Service: Urology;   Laterality: N/A;   TRANSURETHRAL RESECTION OF BLADDER TUMOR N/A 01/03/2023   Procedure: TRANSURETHRAL RESECTION OF BLADDER TUMOR (TURBT);  Surgeon: Lawerence Pressman, MD;  Location: [facility] ORS;  Service: Urology;  Laterality: N/A;      Family History: Family History  Problem Relation Age of Onset   Diabetes Mother    Kidney disease Father    Diabetes Father    Dementia Father    Diabetes Sister    Diabetes Brother    Diabetes Brother    Colon cancer Neg Hx    Esophageal cancer Neg Hx    Stomach cancer Neg Hx    Rectal cancer Neg Hx     Social History:  reports that she has been smoking cigarettes. She started smoking about 41 years ago. She has a 41.4 pack-year smoking history. She has been exposed to tobacco smoke. She has never used smokeless tobacco. She reports current drug use. Drug: Marijuana. She reports that she does not drink alcohol.  Physical Exam: BP 139/83   Pulse (!) 58   Temp (!) 97.4 F (36.3 C) (Temporal)   Resp 16   LMP 12/01/2017   SpO2 100%    Constitutional:  Alert and oriented, No acute distress. Cardiovascular: Regular rate and rhythm Respiratory: Clear to auscultation bilaterally GI: Abdomen is soft, nontender, nondistended, no abdominal masses  Laboratory Data: Urine culture 5/20 with E. coli, treated with culture appropriate Macrobid   Assessment &  Plan:   58 year old female with recurrent high-grade nonmuscle invasive bladder cancer, failed induction BCG x 2, here today for TURBT of small papillary recurrence, referral has already been placed to [facility] for consideration of early cystectomy with her recurrent disease refractory to BCG.  We discussed transurethral resection of bladder tumor (TURBT) and risks and benefits at length. This is typically a 1 to 2-hour procedure done under general anesthesia in the operating room.  A scope is inserted through the urethra and used to resect abnormal tissue within the bladder, which is then sent to the  pathologist to determine grade and stage of the tumor.  Risks include bleeding, infection, need for temporary Foley placement, and bladder perforation.  Treatment strategies are based on the type of tumor and depth of invasion.  We briefly reviewed the different treatment pathways for non-muscle invasive and muscle invasive bladder cancer.  TURBT with gemcitabine   Jay Meth, MD 09/12/2023  [facility] [address] [place], [place] [address] [phone]

## 2023-09-12 NOTE — Anesthesia Postprocedure Evaluation (Signed)
 Anesthesia Post Note  Patient: Brandi Calderon  Procedure(s) Performed: CYSTOSCOPY, WITH BIOPSY INSTILLATION, BLADDER  Patient location during evaluation: PACU Anesthesia Type: General Level of consciousness: awake Pain management: satisfactory to patient Vital Signs Assessment: post-procedure vital signs reviewed and stable Respiratory status: spontaneous breathing Cardiovascular status: blood pressure returned to baseline Anesthetic complications: no   No notable events documented.   Last Vitals:  Vitals:   09/12/23 1145 09/12/23 1200  BP: (!) 124/51 127/83  Pulse: 71 74  Resp: 17 12  Temp:    SpO2: 100% 100%    Last Pain:  Vitals:   09/12/23 1200  TempSrc:   PainSc: 0-No pain                 VAN STAVEREN,Orine Goga

## 2023-09-12 NOTE — Transfer of Care (Signed)
 Immediate Anesthesia Transfer of Care Note  Patient: Brandi Calderon  Procedure(s) Performed: CYSTOSCOPY, WITH BIOPSY INSTILLATION, BLADDER  Patient Location: PACU  Anesthesia Type:General  Level of Consciousness: awake, drowsy, and patient cooperative  Airway & Oxygen  Therapy: Patient Spontanous Breathing and Patient connected to face mask oxygen   Post-op Assessment: Report given to RN and Post -op Vital signs reviewed and stable  Post vital signs: Reviewed and stable  Last Vitals:  Vitals Value Taken Time  BP 102/70 09/12/23 11:18  Temp 36.3 C 09/12/23 11:18  Pulse 82 09/12/23 11:20  Resp 16 09/12/23 11:20  SpO2 100 % 09/12/23 11:20  Vitals shown include unfiled device data.  Last Pain:  Vitals:   09/12/23 0929  TempSrc: Temporal  PainSc: 0-No pain         Complications: No notable events documented.

## 2023-09-13 ENCOUNTER — Encounter: Payer: Self-pay | Admitting: Urology

## 2023-09-15 LAB — SURGICAL PATHOLOGY

## 2023-09-17 ENCOUNTER — Ambulatory Visit: Payer: Self-pay | Admitting: Urology

## 2023-09-17 NOTE — Telephone Encounter (Signed)
 Appointment scheduled.

## 2023-09-23 ENCOUNTER — Other Ambulatory Visit: Payer: Self-pay | Admitting: Internal Medicine

## 2023-09-24 ENCOUNTER — Telehealth: Payer: Self-pay

## 2023-09-24 NOTE — Telephone Encounter (Signed)
 Attempted to reach patient to get her upcoming appt rescheduled. If patient calls back, please reschedule her upcoming appt to July 3rd if she can do that. Thanks!

## 2023-09-24 NOTE — Telephone Encounter (Signed)
 Copied from CRM [id number]. Topic: General - Call Back - No Documentation >> Sep 24, 2023  1:47 PM Dawna HERO wrote: Reason for CRM: patient stated she received a missed call , doesn't know who it was from or what it was about. Stated to the patient I was sending over a message to office.

## 2023-09-24 NOTE — Telephone Encounter (Signed)
 Requested medication (s) are due for refill today: yes  Requested medication (s) are on the active medication list: yes  Last refill:  08/29/23 #30/0  Future visit scheduled: yes  Notes to clinic:  Unable to refill per protocol, cannot delegate.     Requested Prescriptions  Pending Prescriptions Disp Refills   clonazePAM  (KLONOPIN ) 0.5 MG tablet [Pharmacy Med Name: CLONAZEPAM  0.5 MG TABLET] 30 tablet 0    Sig: TAKE 1 TABLET BY MOUTH EVERY DAY AS NEEDED FOR ANXIETY     Not Delegated - Psychiatry: Anxiolytics/Hypnotics 2 Failed - 09/24/2023  2:51 PM      Failed - This refill cannot be delegated      Failed - Urine Drug Screen completed in last 360 days      Failed - Valid encounter within last 6 months    Recent Outpatient Visits   None     Future Appointments             In 1 week [name], Angeline ORN, NP [facility], [facility]            Passed - Patient is not pregnant

## 2023-09-28 ENCOUNTER — Other Ambulatory Visit: Payer: Self-pay | Admitting: Urology

## 2023-10-02 ENCOUNTER — Other Ambulatory Visit: Payer: Self-pay

## 2023-10-03 NOTE — Patient Outreach (Signed)
 Please disregard encounter. Ms. Addair answered call at scheduled time but reports being unable to complete call.  RNCM will follow up after Ms. Reckart confirms availability

## 2023-10-07 ENCOUNTER — Ambulatory Visit: Payer: Self-pay | Admitting: Internal Medicine

## 2023-10-07 NOTE — Progress Notes (Signed)
 History of presenting illness    Brandi Calderon is a 58 y.o. female who is here the for evaluation and treatment of bladder cancer.  Current urinary and other symptoms are noted in the review of systems.  Referring provider: Francisca Redell Dunnings,*  Detailed urological history   05/2022: presented with [facility] + tumor on pelvic US , 7 cm 06/07/22: TURBT, Path HG with c/f T1, MP present and not involved. 5 cm and 7 cm large bladder tumors. 07/26/22: Bladder biopsy, benign 6-09/2022: iBCG 01/03/23: TURBT, 1-+ tumors, largest 3 cm right posterior wall, +Gemcitabine . Path: HG Ta. 11-12/24: iBCG reinduction 05/2023: mBCG x3 09/12/23: 5mm tumor right lateral wall, Lg Ta, no muscle present 10/07/23 Referral to [facility]  Oncology History   No history exists.   Tobacco exposure    Social History   Tobacco Use  Smoking Status Every Day  . Types: Cigarettes  Smokeless Tobacco Never     Past medical and surgical history   Past Medical History:  Diagnosis Date  . Aortic atherosclerosis (CMS-HCC) 05/2022  . Bladder cancer (CMS/HHS-HCC) 2024   Follows at [facility]  . Chronic pain   . COPD (chronic obstructive pulmonary disease) (CMS/HHS-HCC)   . Diabetes mellitus type 2 (CMS-HCC)   . Hyperlipidemia   . Tobacco dependence     Past Surgical History:  Procedure Laterality Date  . CESAREAN DELIVERY  1987, 1989, 1992     Family history   Family History  Problem Relation Name Age of Onset  . Diabetes Father    . Diabetes Sister    . Diabetes Brother       Social history   Social History   Socioeconomic History  . Marital status: Married  Tobacco Use  . Smoking status: Every Day    Types: Cigarettes  . Smokeless tobacco: Never  Vaping Use  . Vaping status: Never Used  Substance and Sexual Activity  . Alcohol use: Not Currently  . Drug use: Never  . Sexual activity: Defer   Social Drivers of Health   Financial Resource Strain: Low Risk  (02/24/2023)   Overall Financial  Resource Strain (CARDIA)   . Difficulty of Paying Living Expenses: Not hard at all  Food Insecurity: No Food Insecurity (07/25/2023)   Received from [facility]   Hunger Vital Sign   . Within the past 12 months, you worried that your food would run out before you got the money to buy more.: Never true   . Within the past 12 months, the food you bought just didn't last and you didn't have money to get more.: Never true  Transportation Needs: No Transportation Needs (07/25/2023)   Received from [facility] - Transportation   . Lack of Transportation (Medical): No   . Lack of Transportation (Non-Medical): No  Physical Activity: Insufficiently Active (02/07/2023)   Received from [facility]   Exercise Vital Sign   . On average, how many days per week do you engage in moderate to strenuous exercise (like a brisk walk)?: 3 days   . On average, how many minutes do you engage in exercise at this level?: 30 min  Stress: Stress Concern Present (02/07/2023)   Received from [facility] - Occupational Stress Questionnaire   . Feeling of Stress : To some extent  Social Connections: Socially Isolated (02/07/2023)   Received from [facility]   Social Connection and Isolation Panel   . In a typical week, how  many times do you talk on the phone with family, friends, or neighbors?: More than three times a week   . How often do you get together with friends or relatives?: Once a week   . How often do you attend church or religious services?: Never   . Do you belong to any clubs or organizations such as church groups, unions, fraternal or athletic groups, or school groups?: No   . How often do you attend meetings of the clubs or organizations you belong to?: Never   . Are you married, widowed, divorced, separated, never married, or living with a partner?: Divorced  Housing Stability: Unknown (02/24/2023)   Housing Stability Vital Sign   . Unable to Pay for  Housing in the Last Year: No   . Homeless in the Last Year: No     Current medications   Current Outpatient Medications  Medication Sig Dispense Refill  . acetaminophen  (TYLENOL ) 500 MG tablet Take by mouth. (Patient not taking: Reported on 09/25/2023)    . albuterol  90 mcg/actuation inhaler INHALE 1-2 PUFFS INTO THE LUNGS EVERY 6 (SIX) HOURS AS NEEDED FOR WHEEZING OR SHORTNESS OF BREATH.    . aspirin  81 MG EC tablet Take 81 mg by mouth once daily    . atorvastatin  (LIPITOR) 80 MG tablet TAKE 1 TABLET (80 MG TOTAL) BY MOUTH ONCE DAILY. 90 tablet 3  . blood-glucose meter (ONETOUCH VERIO REFLECT METER) Misc Use daily to check blood sugars dx e11.9 (Patient not taking: Reported on 09/25/2023) 1 each 0  . clonazePAM  (KLONOPIN ) 0.5 MG tablet Take 0.5 mg by mouth once daily    . DULoxetine (CYMBALTA) 30 MG DR capsule Take 1 capsule (30 mg total) by mouth at bedtime (Patient not taking: Reported on 09/25/2023) 90 capsule 1  . fluticasone  propionate (FLONASE ) 50 mcg/actuation nasal spray Place 2 sprays into both nostrils once daily    . fluticasone -umeclidinium-vilanterol (TRELEGY ELLIPTA ) 100-62.5-25 mcg inhaler     . FUROsemide  (LASIX ) 40 MG tablet Take 40 mg by mouth once daily    . gabapentin  (NEURONTIN ) 300 MG capsule Take 1 capsule (300 mg total) by mouth at bedtime 90 capsule 3  . glipiZIDE  (GLUCOTROL  XL) 5 MG XL tablet Take 1 tablet (5 mg total) by mouth daily with breakfast 90 tablet 3  . hydrOXYzine pamoate (VISTARIL) 25 MG capsule TAKE 1 TO 2 CAPSULES BY MOUTH AT BEDTIME AS NEEDED    . JARDIANCE 25 mg tablet TAKE 1 TABLET (25 MG TOTAL) BY MOUTH DAILY WITH BREAKFAST 90 tablet 3  . lancets Use 1 each 2 (two) times daily Use as instructed. (Patient not taking: Reported on 06/24/2023) 100 each 11  . lisinopriL (ZESTRIL) 5 MG tablet Take 1 tablet (5 mg total) by mouth once daily 90 tablet 3  . metFORMIN  (GLUCOPHAGE ) 1000 MG tablet Take 1 tablet (1,000 mg total) by mouth 2 (two) times daily with  meals 180 tablet 3  . NON FORMULARY 1L O2 at night    . omega 3-dha-epa-fish oil  1,000 mg (120 mg-180 mg) Cap Take by mouth    . ondansetron  (ZOFRAN ) 4 MG tablet TAKE 1 TABLET BY MOUTH EVERY 8 HOURS AS NEEDED FOR NAUSEA 20 tablet 1  . ONETOUCH VERIO TEST STRIPS test strip 2 (two) times daily Use as instructed. (Patient not taking: Reported on 09/25/2023) 100 each 11  . oxyBUTYnin  (DITROPAN  XL) 15 MG XL tablet Take 1 tablet by mouth once daily    . pantoprazole  (PROTONIX ) 40 MG DR  tablet Take 1 tablet by mouth once daily    . semaglutide (OZEMPIC) 2 mg/dose (8 mg/3 mL) pen injector Inject 0.75 mLs (2 mg total) subcutaneously once a week 9 mL 3  . SYMBICORT 160-4.5 mcg/actuation inhaler  (Patient not taking: Reported on 02/24/2023)     No current facility-administered medications for this visit.     Allergies   Penicillins, Liraglutide, Vicodin [hydrocodone -acetaminophen ], and Insulin  aspart   Review of systems   General: Feels normal and well  Psychiatric: No new psychiatric symptoms  Neurologic: No new neurologic symptoms  Vision: No new vision changes  Hearing: No new hearing changes  Endocrine: No new endocrine problems  Cardiovascular: No new cardiovascular symptoms  Respiratory: No new respiratory symptoms  Gastrointestinal: No new gastrointestinal symptoms  Genitourinary: No new genitourinary symptoms  Musculoskeletal: No new musculoskeletal problems  Hematologic: No new hematologic problems     Physical examination:    Vitals:   10/08/23 0937  BP: 121/62  BP Location: Left upper arm  Patient Position: Sitting  BP Cuff Size: Adult  Pulse: 59  Resp: 18  Temp: 36.7 C (98.1 F)  TempSrc: Oral  SpO2: 100%  Weight: 63.4 kg (139 lb 12.4 oz)  Height: 158 cm (5' 2.21)    Performance status: ECOG: 1 - Symptomatic but completely ambulatory Karnofsky: 90  Able to carry on normal activity  Mental status: Normal mental state  Constitutional: Normal grooming and  hygiene, no apparent distress.  HEENT: Atraumatic, normocephalic, nonicteric sclerae, EOMI.  Respiratory: Unlabored breathing, normal chest excursion, no use of accessory muscles of respiration.  Cardiac: Normal peripheral pulses, normal heart rate, normal heart rhythm.  Abdomen:  Soft, non-tender, no palpable masses.  Genitalia: Normal external genitalia.  Extremities: Warm and well perfused.  Musculoskeletal: Normal range of motion, good strength and tone.  Neurologic: Cranial nerves grossly intact, no focal weakness or spasticity.  Lymphatic: Absence of inguinal lymphadenopathy.    Impression and plan:     ICD-10-CM   1. Malignant neoplasm of overlapping sites of bladder (CMS/HHS-HCC)  C67.8      The patient presents with high risk non-muscle invasive bladder cancer. She recently had a small, solitary, low grade recurrence after re-induction BCG which prompted [facility] consultation.  At this time, with a small low grade recurrence, I would not consider this BCG failure or switch to alternative therapies. In fact, I would recommend continuing with 3 years of BCG maintenance with surveillance based on HR NMIBC guidelines. She is able to do this locally and would prefer that. I also recommended a CT Urogram annually given her high risk disease to evaluate the upper tracts, which they will also schedule locally.   Should she recur with high grade pathology or more invasion, at that point would consider alternative therapies intravesically or systemically. We also discussed radical cystectomy, which I would not suggest at this juncture. She needs to quit smoking and would like a referral to our smoking cessation team which we will coordinate.   Should she have HG recurrence, she will come see us  again for discussion of next line agents. In the interim, will continue with mBCG and get a CT Urogram.   The patient asked questions and all of them were answered.  After this discussion the patient  elected to pursue intravesical therapy with BCG.   Attestation Statement:   I personally saw and evaluated the patient, and participated in the management and treatment plan as documented in the resident/fellow note.  ANKEET MUKESH  [facility], MD

## 2023-10-13 ENCOUNTER — Telehealth: Payer: Self-pay | Admitting: Urology

## 2023-10-13 NOTE — Telephone Encounter (Signed)
 Received call from [employer] regarding coverage of Adstiladrin. I spoke with Roseline who said that it was not covered because provider is out of network with plan. I advised her that we are in network with [employer] which is patient's insurance. She said that they will re-process request and will advise our office by fax.

## 2023-10-21 ENCOUNTER — Encounter: Payer: Self-pay | Admitting: Internal Medicine

## 2023-10-21 ENCOUNTER — Telehealth: Payer: Self-pay | Admitting: Urology

## 2023-10-21 ENCOUNTER — Ambulatory Visit (INDEPENDENT_AMBULATORY_CARE_PROVIDER_SITE_OTHER): Admitting: Internal Medicine

## 2023-10-21 VITALS — BP 112/62 | Ht 62.0 in | Wt 138.6 lb

## 2023-10-21 DIAGNOSIS — Z7985 Long-term (current) use of injectable non-insulin antidiabetic drugs: Secondary | ICD-10-CM

## 2023-10-21 DIAGNOSIS — M2550 Pain in unspecified joint: Secondary | ICD-10-CM | POA: Diagnosis not present

## 2023-10-21 DIAGNOSIS — Z7984 Long term (current) use of oral hypoglycemic drugs: Secondary | ICD-10-CM

## 2023-10-21 DIAGNOSIS — C679 Malignant neoplasm of bladder, unspecified: Secondary | ICD-10-CM

## 2023-10-21 DIAGNOSIS — E1140 Type 2 diabetes mellitus with diabetic neuropathy, unspecified: Secondary | ICD-10-CM

## 2023-10-21 DIAGNOSIS — Z5181 Encounter for therapeutic drug level monitoring: Secondary | ICD-10-CM

## 2023-10-21 DIAGNOSIS — E785 Hyperlipidemia, unspecified: Secondary | ICD-10-CM

## 2023-10-21 DIAGNOSIS — E1142 Type 2 diabetes mellitus with diabetic polyneuropathy: Secondary | ICD-10-CM | POA: Diagnosis not present

## 2023-10-21 DIAGNOSIS — I2510 Atherosclerotic heart disease of native coronary artery without angina pectoris: Secondary | ICD-10-CM

## 2023-10-21 DIAGNOSIS — J449 Chronic obstructive pulmonary disease, unspecified: Secondary | ICD-10-CM | POA: Diagnosis not present

## 2023-10-21 DIAGNOSIS — I700 Atherosclerosis of aorta: Secondary | ICD-10-CM | POA: Diagnosis not present

## 2023-10-21 DIAGNOSIS — F419 Anxiety disorder, unspecified: Secondary | ICD-10-CM

## 2023-10-21 DIAGNOSIS — E1169 Type 2 diabetes mellitus with other specified complication: Secondary | ICD-10-CM

## 2023-10-21 DIAGNOSIS — K219 Gastro-esophageal reflux disease without esophagitis: Secondary | ICD-10-CM

## 2023-10-21 DIAGNOSIS — G4733 Obstructive sleep apnea (adult) (pediatric): Secondary | ICD-10-CM

## 2023-10-21 DIAGNOSIS — I2584 Coronary atherosclerosis due to calcified coronary lesion: Secondary | ICD-10-CM

## 2023-10-21 DIAGNOSIS — G894 Chronic pain syndrome: Secondary | ICD-10-CM

## 2023-10-21 DIAGNOSIS — E663 Overweight: Secondary | ICD-10-CM

## 2023-10-21 MED ORDER — KETOROLAC TROMETHAMINE 30 MG/ML IJ SOLN
30.0000 mg | Freq: Once | INTRAMUSCULAR | Status: AC
  Administered 2023-10-21: 30 mg via INTRAMUSCULAR

## 2023-10-21 NOTE — Assessment & Plan Note (Signed)
 Encourage smoking cessation Continue trelegy 100-60 2.5-25 mcg per actuation and albuterol  as needed She will continue to follow with pulmonology

## 2023-10-21 NOTE — Assessment & Plan Note (Signed)
 C-Met and lipid profile today Encouraged her to consume a low-fat diet Continue atorvastatin  80 mg daily and aspirin  81 mg daily

## 2023-10-21 NOTE — Addendum Note (Signed)
 Addended by: [name] ORN on: 10/21/2023 03:35 PM   Modules accepted: Orders

## 2023-10-21 NOTE — Assessment & Plan Note (Signed)
 C-Met and lipid profile today Encouraged her to consume a low-fat diet Continue atorvastatin  80 mg daily

## 2023-10-21 NOTE — Assessment & Plan Note (Signed)
 Encourage diet and exercise for weight loss

## 2023-10-21 NOTE — Assessment & Plan Note (Signed)
 Avoid foods that trigger reflux Continue pantoprazole  40 mg daily

## 2023-10-21 NOTE — Assessment & Plan Note (Signed)
 She is unsure if she is taking duloxetine 30 mg daily but is taking gabapentin  300 mg at bedtime

## 2023-10-21 NOTE — Assessment & Plan Note (Signed)
 C-Met and lipid profile today Encouraged her to consume a low-fat diet Continue atorvastatin  80 mg and aspirin  81 mg daily

## 2023-10-21 NOTE — Progress Notes (Signed)
 Subjective:    Patient ID: Brandi Calderon, female    DOB: 1965-06-21, 58 y.o.   MRN: [id number]  HPI  Patient presents to clinic today for 3-month follow-up of chronic conditions.  DM2 with peripheral neuropathy: Her last A1c was 7.1%, 08/2023.  She is taking metformin , glipizide , jardiance and ozempic as prescribed.  She takes gabapentin  as prescribed for neuropathic pain.  She is on lisinopril for renal protection her sugars range 119-140.  She checks her feet routinely.  Her last eye exam was in 2024, [facility].  Flu 01/2023.  Pneumovax 03/2022.  COVID Pfizer x 1.  She follows with endocrinology.  OSA: She reports she is not currently using a CPAP.  She follows with pulmonology.  Sleep study from 07/2023 reviewed.  HLD with CAD/aortic atherosclerosis: Her last LDL was 126, triglycerides 119, 01/2023.  She denies myalgias on atorvastatin .  She is no longer taking lovaza .  She is taking aspirin  daily.  She tries to consume low-fat diet.  Anxiety and depression: Chronic, she is unsure if she is taking duloxetine but is taking clonazepam .  She is not currently seeing a therapist or psychiatrist.  She denies SI/HI.  Chronic pain: Mainly in her spine and bilateral SI joints.  She is unsure if she is taking duloxetine but is taking gabapentin  as prescribed.  She does not follow with pain management.  GERD: Triggered by everything she eats.  She denies breakthrough on pantoprazole .  There is no upper GI on file.  COPD: She denies chronic cough or shortness of breath.  She is taking trelegy and albuterol  as prescribed.  There are no PFTs on file.  She does smoke.  She follows with pulmonology.  Bladder cancer: She is no longer undergoing BCG treatments.  She is taking furosemide  and oxybutynin  as prescribed.  She follows with urology ad [facility].  Review of Systems  Past Medical History:  Diagnosis Date   Anxiety    Aortic atherosclerosis (HCC)    Arthritis    Asthma    Back pain     Bladder cancer (HCC)    Bladder tumor    COPD (chronic obstructive pulmonary disease) (HCC)    Depression    GERD (gastroesophageal reflux disease)    Headache    History of COVID-19    Hyperlipidemia    Hypertension    Neuromuscular disorder (HCC)    Sleep apnea    Tendonitis    Type 2 diabetes mellitus without complication (HCC)    Urinary tract infection due to extended-spectrum beta lactamase (ESBL) producing Escherichia coli 05/22/2022    Current Outpatient Medications  Medication Sig Dispense Refill   albuterol  (VENTOLIN  HFA) 108 (90 Base) MCG/ACT inhaler INHALE 1-2 PUFFS BY MOUTH EVERY 6 HOURS AS NEEDED FOR WHEEZE OR SHORTNESS OF BREATH 8.5 each 2   aspirin  EC 81 MG tablet Take 1 tablet (81 mg total) by mouth daily. Swallow whole. 30 tablet 12   Aspirin -Salicylamide-Caffeine (BC HEADACHE POWDER PO) Take 4 packets by mouth daily as needed (bad headaches).     atorvastatin  (LIPITOR) 80 MG tablet Take 80 mg by mouth at bedtime.     Blood Glucose Monitoring Suppl (ONETOUCH VERIO FLEX SYSTEM) w/Device KIT Use to check blood sugar daily.  DX E11.9 1 kit 1   clonazePAM  (KLONOPIN ) 0.5 MG tablet TAKE 1 TABLET BY MOUTH EVERY DAY AS NEEDED FOR ANXIETY 30 tablet 0   DULoxetine (CYMBALTA) 30 MG capsule Take 30 mg by mouth at bedtime.  fluticasone  (FLONASE ) 50 MCG/ACT nasal spray SPRAY 2 SPRAYS INTO EACH NOSTRIL EVERY DAY 48 mL 0   Fluticasone -Umeclidin-Vilant (TRELEGY ELLIPTA ) 100-62.5-25 MCG/ACT AEPB Inhale 1 puff into the lungs daily. 60 each 6   furosemide  (LASIX ) 40 MG tablet TAKE 1 TABLET BY MOUTH EVERY DAY 90 tablet 0   gabapentin  (NEURONTIN ) 300 MG capsule Take 300 mg by mouth at bedtime.     glipiZIDE  (GLUCOTROL  XL) 5 MG 24 hr tablet Take 5 mg by mouth daily with breakfast.     glucose blood test strip Use as instructed 100 each 0   JARDIANCE 25 MG TABS tablet Take 25 mg by mouth every morning.     Lancets (ONETOUCH ULTRASOFT) lancets Use as instructed 100 each 12   lisinopril  (ZESTRIL) 5 MG tablet Take 5 mg by mouth every morning.     metFORMIN  (GLUCOPHAGE ) 1000 MG tablet TAKE 1 TABLET (1,000 MG TOTAL) BY MOUTH 2 (TWO) TIMES DAILY WITH A MEAL. 60 tablet 0   nicotine  (NICODERM CQ  - DOSED IN MG/24 HOURS) 21 mg/24hr patch Place 1 patch (21 mg total) onto the skin daily. 30 patch 2   nitrofurantoin , macrocrystal-monohydrate, (MACROBID ) 100 MG capsule Take 1 capsule (100 mg total) by mouth every 12 (twelve) hours. 14 capsule 0   ondansetron  (ZOFRAN ) 4 MG tablet Take 4 mg by mouth every 8 (eight) hours as needed for nausea.     oxybutynin  (DITROPAN  XL) 15 MG 24 hr tablet TAKE 1 TABLET (15 MG TOTAL) BY MOUTH DAILY. 90 tablet 3   OXYGEN  Inhale 1 L into the lungs at bedtime.     pantoprazole  (PROTONIX ) 40 MG tablet TAKE 1 TABLET (40 MG TOTAL) BY MOUTH DAILY AS NEEDED (HEARTBURN). 90 tablet 1   polyethylene glycol (MIRALAX ) 17 g packet Take 17 g by mouth daily. (Patient taking differently: Take 17 g by mouth daily as needed for moderate constipation.) 14 each 0   Semaglutide, 2 MG/DOSE, 8 MG/3ML SOPN Inject 2 mg into the skin once a week. Friday     No current facility-administered medications for this visit.    Allergies  Allergen Reactions   Penicillins Anaphylaxis    Has patient had a PCN reaction causing immediate rash, facial/tongue/throat swelling, SOB or lightheadedness with hypotension: Yes Has patient had a PCN reaction causing severe rash involving mucus membranes or skin necrosis: No Has patient had a PCN reaction that required hospitalization: No Has patient had a PCN reaction occurring within the last 10 years: No If all of the above answers are NO, then may proceed with Cephalosporin use.   Throat swells   Victoza [Liraglutide] Nausea Only    Severe nausea   Vicodin [Hydrocodone -Acetaminophen ] Hives and Rash    And hives    Family History  Problem Relation Age of Onset   Diabetes Mother    Kidney disease Father    Diabetes Father    Dementia  Father    Diabetes Sister    Diabetes Brother    Diabetes Brother    Colon cancer Neg Hx    Esophageal cancer Neg Hx    Stomach cancer Neg Hx    Rectal cancer Neg Hx     Social History   Socioeconomic History   Marital status: Divorced    Spouse name: Not on file   Number of children: 3   Years of education: Not on file   Highest education level: Not on file  Occupational History   Not on file  Tobacco Use  Smoking status: Every Day    Current packs/day: 1.00    Average packs/day: 1 pack/day for 41.6 years (41.6 ttl pk-yrs)    Types: Cigarettes    Start date: 1984    Passive exposure: Current   Smokeless tobacco: Never   Tobacco comments:    1PPD 06/10/2022    1.5 PPD 09/04/23  Vaping Use   Vaping status: Never Used  Substance and Sexual Activity   Alcohol use: No   Drug use: Yes    Types: Marijuana    Comment: States she smokes MJ when she does not have any Tramadol .   Sexual activity: Yes  Other Topics Concern   Not on file  Social History Narrative   ** Merged History Encounter ** In the process of getting SSD for depression, DM and neuropathy      Active smoker [~ 2p/day; cutting down]; no alcohol; lives in [place]; self. Used to work in [employer].    Social Drivers of Corporate investment banker Strain: Low Risk  (02/24/2023)   Received from [facility]   Overall Financial Resource Strain (CARDIA)    Difficulty of Paying Living Expenses: Not hard at all  Food Insecurity: No Food Insecurity (07/25/2023)   Hunger Vital Sign    Worried About Running Out of Food in the Last Year: Never true    Ran Out of Food in the Last Year: Never true  Transportation Needs: No Transportation Needs (07/25/2023)   PRAPARE - Administrator, Civil Service (Medical): No    Lack of Transportation (Non-Medical): No  Physical Activity: Insufficiently Active (02/07/2023)   Exercise Vital Sign    Days of Exercise per Week: 3 days    Minutes of Exercise  per Session: 30 min  Stress: Stress Concern Present (02/07/2023)   [employer] of Occupational Health - Occupational Stress Questionnaire    Feeling of Stress : To some extent  Social Connections: Socially Isolated (02/07/2023)   Social Connection and Isolation Panel    Frequency of Communication with Friends and Family: More than three times a week    Frequency of Social Gatherings with Friends and Family: Once a week    Attends Religious Services: Never    Database administrator or Organizations: No    Attends Banker Meetings: Never    Marital Status: Divorced  Catering manager Violence: Not At Risk (07/25/2023)   Humiliation, Afraid, Rape, and Kick questionnaire    Fear of Current or Ex-Partner: No    Emotionally Abused: No    Physically Abused: No    Sexually Abused: No     Constitutional: Patient reports fatigue.  Denies fever, malaise, headache or abrupt weight changes.  HEENT: Denies eye pain, eye redness, ear pain, ringing in the ears, wax buildup, runny nose, nasal congestion, bloody nose, or sore throat. Respiratory: Denies difficulty breathing, shortness of breath, cough or sputum production.   Cardiovascular: Denies chest pain, chest tightness, palpitations or swelling in the hands or feet.  Gastrointestinal: Patient reports intermittent reflux.  Denies abdominal pain, bloating, constipation, diarrhea or blood in the stool.  GU: Denies urgency, frequency, pain with urination, burning sensation, blood in urine, odor or discharge. Musculoskeletal: Patient reports chronic joint pain.  Denies decrease in range of motion, difficulty with gait, muscle pain or joint swelling.  Skin: Denies redness, rashes, lesions or ulcercations.  Neurological: Patient reports neuropathic pain.  Denies dizziness, difficulty with memory, difficulty with speech or problems with balance  and coordination.  Psych: Patient has a history of anxiety and depression.  Denies SI/HI.  No  other specific complaints in a complete review of systems (except as listed in HPI above).     Objective:   Physical Exam  BP 112/62 (BP Location: Right Arm, Patient Position: Sitting, Cuff Size: Normal)   Ht 5' 2 (1.575 m)   Wt 138 lb 9.6 oz (62.9 kg)   LMP 12/01/2017   BMI 25.35 kg/m    Wt Readings from Last 3 Encounters:  09/04/23 137 lb (62.1 kg)  06/13/23 139 lb 3.2 oz (63.1 kg)  05/20/23 140 lb (63.5 kg)    General: Appears her stated age, overweight in NAD. Skin: Warm, dry and intact. No ulcerations noted. HEENT: Head: normal shape and size; Eyes: sclera white, no icterus, conjunctiva pink, PERRLA and EOMs intact;  Cardiovascular: Normal rate and rhythm. S1,S2 noted.  No murmur, rubs or gallops noted. No JVD or BLE edema. No carotid bruits noted. Pulmonary/Chest: Normal effort and diminished breath sounds. No respiratory distress. No wheezes, rales or ronchi noted.  Abdomen: Soft and nontender. Normal bowel sounds.  Musculoskeletal: No signs of joint swelling.  Her gait is slow but steady without device. Neurological: She appears altered today, but reports it is because she is in pain.  Cranial nerves II-XII grossly intact. Coordination normal.  Psychiatric: Mood and affect flat. Behavior is normal. Judgment and thought content normal.     BMET    Component Value Date/Time   NA 141 09/08/2023 1425   NA 139 06/25/2017 1027   NA 134 (L) 12/07/2012 1809   K 3.8 09/08/2023 1425   K 3.4 (L) 12/07/2012 1809   CL 105 09/08/2023 1425   CL 103 12/07/2012 1809   CO2 26 09/08/2023 1425   CO2 25 12/07/2012 1809   GLUCOSE 220 (H) 09/08/2023 1425   GLUCOSE 274 (H) 12/07/2012 1809   BUN 19 09/08/2023 1425   BUN 10 06/25/2017 1027   BUN 10 12/07/2012 1809   CREATININE 0.65 09/08/2023 1425   CREATININE 0.71 03/14/2022 1336   CALCIUM  9.1 09/08/2023 1425   CALCIUM  8.6 12/07/2012 1809   GFRNONAA >60 09/08/2023 1425   GFRNONAA 84 09/07/2019 1019   GFRAA 97 09/07/2019 1019     Lipid Panel     Component Value Date/Time   CHOL 170 03/14/2022 1336   CHOL 120 12/24/2016 1827   TRIG 119 02/24/2023 0000   HDL 44 (L) 03/14/2022 1336   HDL 29 (L) 12/24/2016 1827   CHOLHDL 3.9 03/14/2022 1336   LDLCALC 126 02/24/2023 0000   LDLCALC 103 (H) 03/14/2022 1336    CBC    Component Value Date/Time   WBC 8.8 09/08/2023 1425   RBC 4.47 09/08/2023 1425   HGB 12.6 09/08/2023 1425   HGB 12.8 06/25/2017 1027   HCT 37.3 09/08/2023 1425   HCT 38.3 06/25/2017 1027   PLT 245 09/08/2023 1425   PLT 348 06/25/2017 1027   MCV 83.4 09/08/2023 1425   MCV 90 06/25/2017 1027   MCV 80 12/07/2012 1809   MCH 28.2 09/08/2023 1425   MCHC 33.8 09/08/2023 1425   RDW 16.3 (H) 09/08/2023 1425   RDW 15.0 06/25/2017 1027   RDW 17.8 (H) 12/07/2012 1809   LYMPHSABS 1.3 06/12/2022 0516   LYMPHSABS 3.3 (H) 09/11/2016 1131   MONOABS 0.8 06/12/2022 0516   EOSABS 0.2 06/12/2022 0516   EOSABS 0.2 09/11/2016 1131   BASOSABS 0.0 06/12/2022 0516   BASOSABS 0.0 09/11/2016  1131    Hgb A1C Lab Results  Component Value Date   HGBA1C 7.1 (A) 04/03/2023           Assessment & Plan:   Multiple joint pain:  Will check ANA, ESR and CRP today Toradol  30 mg IM x 1 advised her that if she is Not taking her duloxetine 30 mg daily, that I would recommend that she restart this as this can help with joint pain  RTC in 6 months for your annual exam Angeline Laura, NP

## 2023-10-21 NOTE — Assessment & Plan Note (Signed)
 A1c and urine micro albumin checked by endocrinology Encourage low-carb diet and exercise for weight loss Continue metformin  1000 mg bid, glipizide  5 mg XL daily, jardiance 25 mg XL daily and ozempic 2 mg weekly Advised her to schedule an appointment for an eye exam Encouraged routine foot exam She declines Prevnar 20 today

## 2023-10-21 NOTE — Telephone Encounter (Signed)
 Pt called office stating [facility] told her she should begin BCG treatments with Dr. Francisca.

## 2023-10-21 NOTE — Patient Instructions (Signed)
 Atherosclerosis  Atherosclerosis is when plaque builds up in the arteries. This causes narrowing and hardening of the arteries. Arteries are blood vessels that carry blood from the heart to all parts of the body. This blood contains oxygen. Plaque occurs due to inflammation or from a buildup of fat, cholesterol, calcium, waste products of cells, and a clotting material in the blood (fibrin). Plaque decreases the amount of blood that can flow through the artery. Atherosclerosis can affect any artery in your body, including: Heart arteries. Damage to these arteries may lead to coronary artery disease, which can cause a heart attack. Brain arteries. Damage to these arteries may cause a stroke. Leg, arm, and pelvis arteries. Peripheral artery disease (PAD) may result from damage to these arteries. Kidney arteries. Kidney (renal) failure may result from damage to kidney arteries. Treatment may slow the disease and prevent further damage to your heart, brain, peripheral arteries, and kidneys. What are the causes? This condition develops slowly over many years. The inner layers of your arteries become damaged and allow the gradual buildup of plaque. The exact cause of atherosclerosis is not fully understood. Symptoms of atherosclerosis do not occur until an artery becomes narrow or blocked. What increases the risk? The following factors may make you more likely to develop this condition: Being middle-aged or older. Certain medical conditions, including: High blood pressure. High cholesterol. High blood fats (triglycerides). Diabetes. Sleep apnea. Obesity. Certain lab levels, including: Elevated C-reactive protein (CRP). This is a sign of increased inflammation in your body. Elevated homocysteine levels. This is an amino acid that is associated with heart and blood vessel disease. Using tobacco or nicotine products. A family history of atherosclerosis. Not exercising enough (sedentary  lifestyle). Being stressed. Drinking too much alcohol or using drugs, such as cocaine or methamphetamine. What are the signs or symptoms? Symptoms of atherosclerosis do not occur until the plaque severely narrows or blocks the artery, which decreases blood flow. Sometimes, atherosclerosis does not cause symptoms. Symptoms of this condition include: Coronary artery disease. This may cause chest pain and shortness of breath. Decreased blood supply to your brain, which may cause a stroke. Signs of a stroke may include sudden: Weakness or numbness in your face, arm, or leg, especially on one side of your body. Trouble walking or difficulty moving your arms or legs. Loss of balance or coordination. Confusion. Slurred speech. Trouble speaking, or trouble understanding speech, or both (aphasia). Vision changes in one or both eyes. This may be double vision, blurred vision, or loss of vision. Severe headache with no known cause. The headache is often described as the worst headache ever experienced. PAD, which may cause pain, numbness, or nonhealing wounds, often in your legs and hips. Renal failure. This may cause tiredness, problems with urination, swelling, and itchy skin. How is this diagnosed? This condition is diagnosed based on your medical history and a physical exam. During the exam, your health care provider will: Check your pulse in different places. Listen for a "whooshing" sound over your arteries (bruit). You may also have tests, such as: Blood tests to check your levels of cholesterol, triglycerides, blood sugar, and CRP. Ankle-brachial index to compare blood pressure in your arms to blood pressure in your ankles to see how your blood is flowing. Heart (cardiac) tests. Electrocardiogram (ECG) to check for heart damage. Stress test to see how your heart reacts to exercise. Ultrasound tests. Ultrasound of your peripheral arteries to check blood flow. Echocardiogram to get images of  your heart's  chambers and valves. X-ray tests. Chest X-ray to see if you have an enlarged heart, which is a sign of heart failure. CT scan to check for damage to your heart, brain, or arteries. Angiogram. This is a test where dye is injected and X-rays are used to see the blood flow in the arteries. How is this treated? This condition is treated with lifestyle changes as the first step. These may include: Changing your diet. Losing weight. Reducing stress. Exercising and being physically active more regularly. Quitting smoking. You may also need medicine to: Lower triglycerides and cholesterol. Control blood pressure. Prevent blood clots. Lower inflammation in your body. Control your blood sugar. Sometimes, surgery is needed to: Remove plaque from an artery (endarterectomy). Open or widen a narrowed heart artery or peripheral artery (angioplasty). Create a new path for your blood with one of these procedures: Heart (coronary) artery bypass graft surgery. Peripheral artery bypass graft surgery. Place a small mesh tube (stent) in an artery to open or widen a narrowed artery. Follow these instructions at home: Eating and drinking  Eat a heart-healthy diet. Talk with your health care provider or a dietitian if you need help. A heart-healthy diet involves: Limiting unhealthy fats and increasing healthy fats. Some examples of healthy fats are avocados and olive oil. Eating plant-based foods, such as fruits, vegetables, nuts, whole grains, and legumes (such as peas and lentils). If you drink alcohol: Limit how much you have to: 0-1 drink a day for women who are not pregnant. 0-2 drinks a day for men. Know how much alcohol is in a drink. In the [place] one drink equals one 12 oz bottle of beer (355 mL), one 5 oz glass of wine (148 mL), or one 1 oz glass of hard liquor (44 mL). Lifestyle  Maintain a healthy weight. Lose weight if your health care provider says that you need to do  that. Follow an exercise program as told by your health care provider. Do not use any products that contain nicotine or tobacco. These products include cigarettes, chewing tobacco, and vaping devices, such as e-cigarettes. If you need help quitting, ask your health care provider. Do not use drugs. General instructions Take over-the-counter and prescription medicines only as told by your health care provider. Manage other health conditions as told. Keep all follow-up visits. This is important. Contact a health care provider if you have: An irregular heartbeat. Unexplained tiredness (fatigue). Trouble urinating, or you are producing less urine or foamy urine. Swelling of your hands or feet, or itchy skin. Unexplained pain or numbness in your legs or hips. A wound that is slow to heal or is not healing. Get help right away if: You have any symptoms of a heart attack. These may be: Chest pain. This includes squeezing chest pain that may feel like indigestion (angina). Shortness of breath. Pain in your neck, jaw, arms, back, or stomach. Cold sweat. Nausea. Light-headedness. Sudden pain, numbness, or coldness in a limb. You have any symptoms of a stroke. "BE FAST" is an easy way to remember the main warning signs of a stroke: B - Balance. Signs are dizziness, sudden trouble walking, or loss of balance. E - Eyes. Signs are trouble seeing or a sudden change in vision. F - Face. Signs are sudden weakness or numbness of the face, or the face or eyelid drooping on one side. A - Arms. Signs are weakness or numbness in an arm. This happens suddenly and usually on one side of the body. S -  Speech. Signs are sudden trouble speaking, slurred speech, or trouble understanding what people say. T - Time. Time to call emergency services. Write down what time symptoms started. You have other signs of a stroke, such as: A sudden, severe headache with no known cause. Nausea or vomiting. Seizure. These  symptoms may represent a serious problem that is an emergency. Do not wait to see if the symptoms will go away. Get medical help right away. Call your local emergency services (911 in the [place] Do not drive yourself to the hospital. Summary Atherosclerosis is when plaque builds up in the arteries and causes narrowing and hardening of the arteries. Plaque occurs due to inflammation or from a buildup of fat, cholesterol, calcium, cellular waste products, and fibrin. This condition may not cause any symptoms. Symptoms of atherosclerosis do not occur until the plaque severely narrows or blocks the artery. Treatment starts with lifestyle changes and may include medicines. In some cases, surgery is needed. Get help right away if you have any symptoms of a heart attack or stroke. This information is not intended to replace advice given to you by your health care provider. Make sure you discuss any questions you have with your health care provider. Document Revised: 06/21/2020 Document Reviewed: 06/21/2020 Elsevier Patient Education  2024 [employer].

## 2023-10-21 NOTE — Assessment & Plan Note (Signed)
She will continue treatment per urology

## 2023-10-21 NOTE — Assessment & Plan Note (Signed)
 She is unsure if she is taking duloxetine 30 mg, advised her to go home and find out if she has active prescription for this and is taking this I will not have her solely on clonazepam  0.5 mg daily as needed  Support offered

## 2023-10-21 NOTE — Assessment & Plan Note (Signed)
 Continue gabapentin  300 mg at bedtime Discussed the importance of good sugar control

## 2023-10-21 NOTE — Assessment & Plan Note (Signed)
 Has not been ordered a CPAP Recommendation from sleep study was dental device versus weight loss She will continue to follow with pulmonology.

## 2023-10-22 ENCOUNTER — Ambulatory Visit: Payer: Self-pay | Admitting: Internal Medicine

## 2023-10-22 DIAGNOSIS — R7982 Elevated C-reactive protein (CRP): Secondary | ICD-10-CM

## 2023-10-23 LAB — COMPREHENSIVE METABOLIC PANEL WITH GFR
AG Ratio: 1.6 (calc) (ref 1.0–2.5)
ALT: 9 U/L (ref 6–29)
AST: 10 U/L (ref 10–35)
Albumin: 4.4 g/dL (ref 3.6–5.1)
Alkaline phosphatase (APISO): 111 U/L (ref 37–153)
BUN/Creatinine Ratio: 15 (calc) (ref 6–22)
BUN: 19 mg/dL (ref 7–25)
CO2: 29 mmol/L (ref 20–32)
Calcium: 9.5 mg/dL (ref 8.6–10.4)
Chloride: 99 mmol/L (ref 98–110)
Creat: 1.29 mg/dL — ABNORMAL HIGH (ref 0.50–1.03)
Globulin: 2.7 g/dL (ref 1.9–3.7)
Glucose, Bld: 152 mg/dL — ABNORMAL HIGH (ref 65–99)
Potassium: 4.3 mmol/L (ref 3.5–5.3)
Sodium: 138 mmol/L (ref 135–146)
Total Bilirubin: 0.4 mg/dL (ref 0.2–1.2)
Total Protein: 7.1 g/dL (ref 6.1–8.1)
eGFR: 49 mL/min/1.73m2 — ABNORMAL LOW (ref >=60)

## 2023-10-23 LAB — ANA: Anti Nuclear Antibody (ANA): NEGATIVE

## 2023-10-23 LAB — LIPID PANEL
Cholesterol: 189 mg/dL (ref <200)
HDL: 44 mg/dL — ABNORMAL LOW (ref >=50)
LDL Cholesterol (Calc): 123 mg/dL — ABNORMAL HIGH
Non-HDL Cholesterol (Calc): 145 mg/dL — ABNORMAL HIGH (ref <130)
Total CHOL/HDL Ratio: 4.3 (calc) (ref <5.0)
Triglycerides: 117 mg/dL (ref <150)

## 2023-10-23 LAB — CBC
HCT: 41.7 % (ref 35.0–45.0)
Hemoglobin: 13.6 g/dL (ref 11.7–15.5)
MCH: 28.5 pg (ref 27.0–33.0)
MCHC: 32.6 g/dL (ref 32.0–36.0)
MCV: 87.2 fL (ref 80.0–100.0)
MPV: 9.8 fL (ref 7.5–12.5)
Platelets: 269 Thousand/uL (ref 140–400)
RBC: 4.78 Million/uL (ref 3.80–5.10)
RDW: 15.8 % — ABNORMAL HIGH (ref 11.0–15.0)
WBC: 11 Thousand/uL — ABNORMAL HIGH (ref 3.8–10.8)

## 2023-10-23 LAB — HEMOGLOBIN A1C
Hgb A1c MFr Bld: 7 % — ABNORMAL HIGH (ref <5.7)
Mean Plasma Glucose: 154 mg/dL
eAG (mmol/L): 8.5 mmol/L

## 2023-10-23 LAB — C-REACTIVE PROTEIN: CRP: 101 mg/L — ABNORMAL HIGH (ref <8.0)

## 2023-10-23 LAB — SEDIMENTATION RATE: Sed Rate: 29 mm/h (ref 0–30)

## 2023-10-24 ENCOUNTER — Telehealth: Payer: Self-pay

## 2023-10-26 ENCOUNTER — Other Ambulatory Visit: Payer: Self-pay | Admitting: Nurse Practitioner

## 2023-10-26 DIAGNOSIS — J449 Chronic obstructive pulmonary disease, unspecified: Secondary | ICD-10-CM

## 2023-10-29 ENCOUNTER — Other Ambulatory Visit: Payer: Self-pay | Admitting: Internal Medicine

## 2023-10-29 NOTE — Telephone Encounter (Signed)
 Rep Adina Lee stopped by clinic today regarding this pt.   Benefit manager - Joen Bach with [employer] did confirm that [facility] medicare is stating we are OON. She advise we contact the insurance and update. Once completed she will rerun pts insurance.   Reached out to Kayla in payer enrollment manager to update. She is working on it.

## 2023-10-30 NOTE — Telephone Encounter (Signed)
 [employer] had been updated.   Sherry with [employer] aware. Benefits will be  ran again. Awaiting fax with benefit summary. Could take 2-5 days.

## 2023-10-30 NOTE — Telephone Encounter (Signed)
 Requested medications are due for refill today.  yes  Requested medications are on the active medications list.  yes  Last refill. 09/25/2023 #30 0 rf  Future visit scheduled.   Yes - with lab and pharmacy  Notes to clinic.  Refill not delegated.    Requested Prescriptions  Pending Prescriptions Disp Refills   clonazePAM  (KLONOPIN ) 0.5 MG tablet [Pharmacy Med Name: CLONAZEPAM  0.5 MG TABLET] 30 tablet 0    Sig: TAKE 1 TABLET BY MOUTH EVERY DAY AS NEEDED FOR ANXIETY DNF 9370     Not Delegated - Psychiatry: Anxiolytics/Hypnotics 2 Failed - 10/30/2023  9:46 AM      Failed - This refill cannot be delegated      Failed - Urine Drug Screen completed in last 360 days      Passed - Patient is not pregnant      Passed - Valid encounter within last 6 months    Recent Outpatient Visits           1 week ago Type 2 diabetes mellitus with diabetic polyneuropathy, without long-term current use of insulin  [facility])   [place] [facility] [place], Angeline ORN, [place]

## 2023-11-06 ENCOUNTER — Other Ambulatory Visit

## 2023-11-06 DIAGNOSIS — R7982 Elevated C-reactive protein (CRP): Secondary | ICD-10-CM

## 2023-11-07 ENCOUNTER — Other Ambulatory Visit: Payer: Self-pay | Admitting: Internal Medicine

## 2023-11-07 ENCOUNTER — Ambulatory Visit: Payer: Self-pay | Admitting: Internal Medicine

## 2023-11-07 LAB — C-REACTIVE PROTEIN: CRP: 5.5 mg/L (ref <8.0)

## 2023-11-07 NOTE — Telephone Encounter (Signed)
 LM for Joen to contact the office with an update.

## 2023-11-08 LAB — DRUG MONITORING, PANEL 8 WITH CONFIRMATION, URINE
6 Acetylmorphine: NEGATIVE ng/mL (ref <10)
Alcohol Metabolites: NEGATIVE ng/mL (ref <500)
Amphetamines: NEGATIVE ng/mL (ref <500)
Benzodiazepines: NEGATIVE ng/mL (ref <100)
Buprenorphine, Urine: NEGATIVE ng/mL (ref <5)
Cocaine Metabolite: NEGATIVE ng/mL (ref <150)
Creatinine: 80.9 mg/dL (ref >=20.0)
MDMA: NEGATIVE ng/mL (ref <500)
Marijuana Metabolite: 206 ng/mL — ABNORMAL HIGH (ref <5)
Marijuana Metabolite: POSITIVE ng/mL — AB (ref <20)
Opiates: NEGATIVE ng/mL (ref <100)
Oxidant: NEGATIVE ug/mL (ref <200)
Oxycodone: NEGATIVE ng/mL (ref <100)
pH: 5.4 (ref 4.5–9.0)

## 2023-11-08 LAB — DM TEMPLATE

## 2023-11-08 LAB — MICROALBUMIN / CREATININE URINE RATIO
Creatinine, Urine: 74 mg/dL (ref 20–275)
Microalb Creat Ratio: 5 mg/g{creat} (ref <30)
Microalb, Ur: 0.4 mg/dL

## 2023-11-10 ENCOUNTER — Telehealth: Payer: Self-pay | Admitting: Pharmacist

## 2023-11-10 ENCOUNTER — Other Ambulatory Visit

## 2023-11-10 NOTE — Telephone Encounter (Signed)
 Requested Prescriptions  Pending Prescriptions Disp Refills   fluticasone  (FLONASE ) 50 MCG/ACT nasal spray [Pharmacy Med Name: FLUTICASONE  PROP 50 MCG SPRAY] 48 mL 0    Sig: SPRAY 2 SPRAYS INTO EACH NOSTRIL EVERY DAY     Ear, Nose, and Throat: Nasal Preparations - Corticosteroids Passed - 11/10/2023  5:38 PM      Passed - Valid encounter within last 12 months    Recent Outpatient Visits           2 weeks ago Type 2 diabetes mellitus with diabetic polyneuropathy, without long-term current use of insulin  [facility])   [place] [facility] [place], Angeline ORN, [place]

## 2023-11-10 NOTE — Telephone Encounter (Addendum)
   Outreach Note  11/10/2023 Name: Brandi Calderon MRN: [id number] DOB: 1966-01-08  Referred by: [name] ORN, NP  Was unable to reach patient via telephone today and unable to leave a message as no voicemail picks up   Follow Up Plan: Will attempt to reach patient by telephone again within the next 30 days  Sharyle Sia, PharmD, [facility] [phone]

## 2023-11-11 ENCOUNTER — Telehealth: Payer: Self-pay

## 2023-11-11 NOTE — Telephone Encounter (Signed)
 Contacted Sherry with [employer] again. She will f/u  with her team to get pts benefits. She will call me on my cell once completed.   Contacted Brandi Calderon and let her know we are waiting on her Benefit summary. I will be in touch ASAP.  Brandi Calderon was appreciative of call.

## 2023-11-11 NOTE — Telephone Encounter (Signed)
 Patient is calling to know status of her treatments after having her surgery last month. Pt would like some information on when she can start her cancer treatments.

## 2023-11-18 ENCOUNTER — Other Ambulatory Visit: Payer: 59 | Admitting: Urology

## 2023-11-24 ENCOUNTER — Other Ambulatory Visit: Payer: Self-pay

## 2023-11-25 ENCOUNTER — Other Ambulatory Visit: Payer: Self-pay | Admitting: Internal Medicine

## 2023-11-25 NOTE — Telephone Encounter (Signed)
 Adstiladrin not covered by pts insurance.   Per SM will schedule 3 BCG treatment. Per [employer] cysto 1 month after completion of BCG.   Pt aware. Appts made. Due to pts availability and providers on pal - scheduled with SV.

## 2023-11-25 NOTE — Patient Outreach (Signed)
 Complex Care Management   Visit Note    Name:  Ericia Moxley MRN: [id number] DOB: 08-15-1965  Situation: Referral received for Complex Care Management related to Diabetes and Bladder Cancer I obtained verbal consent from Patient.  Visit completed with Ms. Harbison via telephone.  Background:   Past Medical History:  Diagnosis Date   Anxiety    Aortic atherosclerosis (HCC)    Arthritis    Asthma    Back pain    Bladder cancer (HCC)    Bladder tumor    COPD (chronic obstructive pulmonary disease) (HCC)    Depression    GERD (gastroesophageal reflux disease)    Headache    History of COVID-19    Hyperlipidemia    Hypertension    Neuromuscular disorder (HCC)    Sleep apnea    Tendonitis    Type 2 diabetes mellitus without complication (HCC)    Urinary tract infection due to extended-spectrum beta lactamase (ESBL) producing Escherichia coli 05/22/2022   Outpatient Encounter Medications as of 11/24/2023  Medication Sig   albuterol  (VENTOLIN  HFA) 108 (90 Base) MCG/ACT inhaler INHALE 1-2 PUFFS BY MOUTH EVERY 6 HOURS AS NEEDED FOR WHEEZE OR SHORTNESS OF BREATH   aspirin  EC 81 MG tablet Take 1 tablet (81 mg total) by mouth daily. Swallow whole.   atorvastatin  (LIPITOR) 80 MG tablet Take 80 mg by mouth at bedtime.   Blood Glucose Monitoring Suppl (ONETOUCH VERIO FLEX SYSTEM) w/Device KIT Use to check blood sugar daily.  DX E11.9   clonazePAM  (KLONOPIN ) 0.5 MG tablet TAKE 1 TABLET BY MOUTH EVERY DAY AS NEEDED FOR ANXIETY DNF 0629   DULoxetine (CYMBALTA) 30 MG capsule Take 30 mg by mouth at bedtime.   fluticasone  (FLONASE ) 50 MCG/ACT nasal spray SPRAY 2 SPRAYS INTO EACH NOSTRIL EVERY DAY   furosemide  (LASIX ) 40 MG tablet TAKE 1 TABLET BY MOUTH EVERY DAY   gabapentin  (NEURONTIN ) 300 MG capsule Take 300 mg by mouth at bedtime.   glipiZIDE  (GLUCOTROL  XL) 5 MG 24 hr tablet Take 5 mg by mouth daily with breakfast.   glucose blood test strip Use as instructed   JARDIANCE 25 MG TABS tablet  Take 25 mg by mouth every morning.   Lancets (ONETOUCH ULTRASOFT) lancets Use as instructed   lisinopril (ZESTRIL) 5 MG tablet Take 5 mg by mouth every morning.   metFORMIN  (GLUCOPHAGE ) 1000 MG tablet TAKE 1 TABLET (1,000 MG TOTAL) BY MOUTH 2 (TWO) TIMES DAILY WITH A MEAL.   ondansetron  (ZOFRAN ) 4 MG tablet Take 4 mg by mouth every 8 (eight) hours as needed for nausea.   oxybutynin  (DITROPAN  XL) 15 MG 24 hr tablet TAKE 1 TABLET (15 MG TOTAL) BY MOUTH DAILY.   OXYGEN  Inhale 1 L into the lungs at bedtime.   pantoprazole  (PROTONIX ) 40 MG tablet TAKE 1 TABLET (40 MG TOTAL) BY MOUTH DAILY AS NEEDED (HEARTBURN).   polyethylene glycol (MIRALAX ) 17 g packet Take 17 g by mouth daily.   Semaglutide, 2 MG/DOSE, 8 MG/3ML SOPN Inject 2 mg into the skin once a week. Friday   TRELEGY ELLIPTA  100-62.5-25 MCG/ACT AEPB TAKE 1 PUFF BY MOUTH EVERY DAY   No facility-administered encounter medications on file as of 11/24/2023.      Ms. Sui reports working with the Oncology team and Dr. Francisca regarding treatment plan for bladder cancer. Reports treatment is currently on hold until approved by her insurance provider. She will follow up with Dr. Jairo on December 18, 2023. Denies urgent needs and declined  current need for additional care management outreach. Prefers to focus on plan to cancer treatment. Agreed to call or contact the clinic if her care needs change. The care management team will gladly assist.   Jackson Acron [facility] RN Care Manager Direct Dial: [phone]  Fax: [phone] Website: [email or web address]

## 2023-11-27 NOTE — Telephone Encounter (Signed)
 Requested medications are due for refill today.  yes  Requested medications are on the active medications list.  yes  Last refill. 10/30/2023 #30 0 rf  Future visit scheduled.   With pharmacy  Notes to clinic.  Refill not delegated.    Requested Prescriptions  Pending Prescriptions Disp Refills   clonazePAM  (KLONOPIN ) 0.5 MG tablet [Pharmacy Med Name: CLONAZEPAM  0.5 MG TABLET] 30 tablet 0    Sig: TAKE 1 TABLET BY MOUTH EVERY DAY AS NEEDED FOR ANXIETY DNF 9370     Not Delegated - Psychiatry: Anxiolytics/Hypnotics 2 Failed - 11/27/2023 11:56 AM      Failed - This refill cannot be delegated      Failed - Urine Drug Screen completed in last 360 days      Passed - Patient is not pregnant      Passed - Valid encounter within last 6 months    Recent Outpatient Visits           1 month ago Type 2 diabetes mellitus with diabetic polyneuropathy, without long-term current use of insulin  [facility])   [place] [facility] [place], Angeline ORN, NP       Future Appointments             In 1 week Maurine Lukes, PA-C [facility] [place]   In 2 weeks Maurine Lukes, PA-C [facility] [place]   In 3 weeks Maurine Lukes, [facility] [place]

## 2023-12-05 ENCOUNTER — Telehealth: Payer: Self-pay | Admitting: Pharmacist

## 2023-12-05 ENCOUNTER — Other Ambulatory Visit

## 2023-12-05 NOTE — Progress Notes (Signed)
   Outreach Note  12/05/2023 Name: Aleeya Veitch MRN: [id number] DOB: Oct 05, 1965  Referred by: [name] ORN, NP  Was unable to reach patient via telephone today and unable to leave a message as no voicemail picks up   Follow Up Plan: Will collaborate with Care Guide to outreach to schedule follow up with me  Sharyle Sia, PharmD, [facility] Clinical Pharmacist [facility] [phone]

## 2023-12-08 ENCOUNTER — Ambulatory Visit: Admitting: Physician Assistant

## 2023-12-15 ENCOUNTER — Ambulatory Visit: Admitting: Physician Assistant

## 2023-12-18 ENCOUNTER — Other Ambulatory Visit: Admitting: Urology

## 2023-12-24 ENCOUNTER — Ambulatory Visit: Admitting: Physician Assistant

## 2023-12-29 ENCOUNTER — Other Ambulatory Visit

## 2023-12-30 ENCOUNTER — Other Ambulatory Visit: Payer: Self-pay | Admitting: Internal Medicine

## 2023-12-31 NOTE — Telephone Encounter (Signed)
 Patient called wanting update on authorization for BCG treatments. Also, she is asking if she needs to be seen again in office before having treatments, since it has been several months since she was seen. Please advise patient.

## 2024-01-01 NOTE — Telephone Encounter (Signed)
 Requested medication (s) are due for refill today: yes  Requested medication (s) are on the active medication list: yes  Last refill:  10/2923 #30  Future visit scheduled: no  Notes to clinic:  med not delegated to NT to RF   Requested Prescriptions  Pending Prescriptions Disp Refills   clonazePAM  (KLONOPIN ) 0.5 MG tablet [Pharmacy Med Name: CLONAZEPAM  0.5 MG TABLET] 30 tablet 0    Sig: TAKE 1 TABLET BY MOUTH EVERY DAY AS NEEDED FOR ANXIETY     Not Delegated - Psychiatry: Anxiolytics/Hypnotics 2 Failed - 01/01/2024  1:34 PM      Failed - This refill cannot be delegated      Failed - Urine Drug Screen completed in last 360 days      Passed - Patient is not pregnant      Passed - Valid encounter within last 6 months    Recent Outpatient Visits           2 months ago Type 2 diabetes mellitus with diabetic polyneuropathy, without long-term current use of insulin  [facility])   [place] [facility] [place], Angeline ORN, [place]

## 2024-01-05 ENCOUNTER — Other Ambulatory Visit (INDEPENDENT_AMBULATORY_CARE_PROVIDER_SITE_OTHER): Admitting: Pharmacist

## 2024-01-05 DIAGNOSIS — E1142 Type 2 diabetes mellitus with diabetic polyneuropathy: Secondary | ICD-10-CM

## 2024-01-05 DIAGNOSIS — Z7985 Long-term (current) use of injectable non-insulin antidiabetic drugs: Secondary | ICD-10-CM

## 2024-01-05 NOTE — Patient Instructions (Signed)
Goals Addressed             This Visit's Progress    Pharmacy Goals       Our goal A1c is less than 7%. This corresponds with fasting sugars less than 130 and 2 hour after meal sugars less than 180. Please check your blood sugar each morning  Our goal bad cholesterol, or LDL, is less than 70 . This is why it is important to continue taking your atorvastatin  Please follow up with the [place] Quitline for their help with quitting smoking.  The [place] Quitline phone number is: 1-800-784-8669  Feel free to call me with any questions or concerns. I look forward to our next call!  Elisabeth Delles, PharmD, BCACP Clinical Pharmacist [facility] [place] [phone]        

## 2024-01-05 NOTE — Progress Notes (Signed)
 01/05/2024 Name: Brandi Calderon MRN: [id number] DOB: 1965/10/06  Chief Complaint  Patient presents with   Medication Management   Medication Adherence    Brandi Calderon is a 58 y.o. year old female who presented for a telephone visit.   They were referred to the pharmacist by their PCP for assistance in managing diabetes, hypertension, and hyperlipidemia.      Subjective:   Care Team: Primary Care Provider: [name] ORN, NP; Next Scheduled Visit: 04/05/2024 Pulmonologist: [facility] Endocrinologist: Dr. Damian; Next Scheduled Visit: 01/27/2024    Medication Access/Adherence  Current Pharmacy:  [id number] [place], [place] - [address] [place] [address] Phone: [phone] Fax: [phone]   Patient reports affordability concerns with their medications: No  Patient reports access/transportation concerns to their pharmacy: No  Patient reports adherence concerns with their medications:  No     Patient using weekly pillbox to organize her medications  Reports planning to change to [facility] for management of bladder cancer.    From review of lab results from July, note that renal function was significantly decreased compared to results from June. - Today patient states that since she has been staying well hydrated and confirms stopped furosemide  as recommended by PCP. Denies any NSAID use    Diabetes: Patient followed by [facility] Endocrinology for management of T2DM   Current medications:  Metformin  1000 mg twice daily Ozempic 2 mg once weekly Reports tolerating; taking ondansetron  4 mg on day of Ozempic injection as directed by Endocrinologist Jardiance 25 mg daily glipizide  ER 5 mg daily  Medications tried in the past: Trulicity    Recalls recent morning fasting blood sugars ranging 100-130    Denies symptoms of hypoglycemia - Carries glucose tablets   Statin: atorvastatin  80 mg daily - Reports had been  off of atorvastatin  prior to her latest appointment with PCP as it was upsetting her stomach to take full tablet, but now taking 1/2 tablet twice daily and tolerating dose   ACE-I: lisinopril 5 mg daily     COPD:   Current medications: Trelegy - 1 puff daily Albuterol  - 1-2 puffs every 6 hours as needed Overnight oxygen    Reports using Trelegy consistently and rinsing mouth after each use and using rescue (albuterol ) inhaler as needed as directed     Tobacco Abuse: Reports has cut back; currently smoking ~15 cigarettes/day Triggers: boredom, others smoking around her, stress Reports using nicotine  patches, which has helped her with cutting back Reports has been prescribed Chantix, but has not yet started. Reports concerned about side effects such as impact on sleep, particularly as a family member had difficulty with this  Objective:  Lab Results  Component Value Date   HGBA1C 7.0 (H) 10/21/2023    Lab Results  Component Value Date   CREATININE 1.29 (H) 10/21/2023   BUN 19 10/21/2023   NA 138 10/21/2023   K 4.3 10/21/2023   CL 99 10/21/2023   CO2 29 10/21/2023    Lab Results  Component Value Date   CHOL 189 10/21/2023   HDL 44 (L) 10/21/2023   LDLCALC 123 (H) 10/21/2023   TRIG 117 10/21/2023   CHOLHDL 4.3 10/21/2023    Medications Reviewed Today     Reviewed by Alana Sharyle LABOR, RPH-CPP (Pharmacist) on 01/05/24 at 1558  Med List Status: <None>   Medication Order Taking? Sig Documenting Provider Last Dose Status Informant  albuterol  (VENTOLIN  HFA) 108 (90 Base) MCG/ACT inhaler  [id number]  INHALE 1-2 PUFFS BY MOUTH EVERY 6 HOURS AS NEEDED FOR WHEEZE OR SHORTNESS OF BREATH [name] ORN, NP  Active Self  aspirin  EC 81 MG tablet [id number]  Take 1 tablet (81 mg total) by mouth daily. Swallow whole. [name] ORN, NP  Active Self  atorvastatin  (LIPITOR) 80 MG tablet [id number] Yes Take 80 mg by mouth at bedtime. [provider]  Active Self  Blood  Glucose Monitoring Suppl (ONETOUCH VERIO FLEX SYSTEM) w/Device KIT [id number]  Use to check blood sugar daily.  DX E11.9 [name] ORN, NP  Active Self  clonazePAM  (KLONOPIN ) 0.5 MG tablet [id number]  TAKE 1 TABLET BY MOUTH EVERY DAY AS NEEDED FOR ANXIETY [name], Angeline ORN, NP  Active   DULoxetine (CYMBALTA) 30 MG capsule [id number]  Take 30 mg by mouth at bedtime. [provider]  Active Self  fluticasone  (FLONASE ) 50 MCG/ACT nasal spray [id number]  SPRAY 2 SPRAYS INTO EACH NOSTRIL EVERY DAY [name] ORN, NP  Active    Patient not taking:   Discontinued 01/05/24 1557 (Patient has not taken in last 30 days) gabapentin  (NEURONTIN ) 300 MG capsule [id number]  Take 300 mg by mouth at bedtime. [provider]  Active Self  glipiZIDE  (GLUCOTROL  XL) 5 MG 24 hr tablet [id number]  Take 5 mg by mouth daily with breakfast. [provider]  Active Self  glucose blood test strip [id number]  Use as instructed [name] ORN, NP  Active Self  JARDIANCE 25 MG TABS tablet [id number]  Take 25 mg by mouth every morning. [provider]  Active Self  Lancets JANETT ULTRASOFT) lancets [id number]  Use as instructed [name] ORN, NP  Active Self  lisinopril (ZESTRIL) 5 MG tablet [id number]  Take 5 mg by mouth every morning. [provider]  Active Self  metFORMIN  (GLUCOPHAGE ) 1000 MG tablet [id number]  TAKE 1 TABLET (1,000 MG TOTAL) BY MOUTH 2 (TWO) TIMES DAILY WITH A MEAL. [name] ORN, NP  Active Self  ondansetron  (ZOFRAN ) 4 MG tablet [id number]  Take 4 mg by mouth every 8 (eight) hours as needed for nausea. [provider]  Active Self  oxybutynin  (DITROPAN  XL) 15 MG 24 hr tablet [id number]  TAKE 1 TABLET (15 MG TOTAL) BY MOUTH DAILY. Francisca Redell BROCKS, MD  Active   OXYGEN  [id number]  Inhale 1 L into the lungs at bedtime. [provider]  Active Self  pantoprazole  (PROTONIX ) 40 MG tablet [id number]  TAKE 1 TABLET (40 MG TOTAL) BY MOUTH DAILY AS NEEDED  (HEARTBURN). [name] ORN, NP  Active   polyethylene glycol (MIRALAX ) 17 g packet [id number]  Take 17 g by mouth daily. Clide Burnard Ee, MD  Active Self  Semaglutide, 2 MG/DOSE, 8 MG/3ML NELMA [id number]  Inject 2 mg into the skin once a week. Friday [provider]  Active Self  TRELEGY ELLIPTA  100-62.5-25 MCG/ACT AEPB [id number]  TAKE 1 PUFF BY MOUTH EVERY DAY Malachy Comer GAILS, NP  Active   Med List Note Geanie Wolm CHRISTELLA OBIE 06/25/18 9160): UDS 06-25-2018              Assessment/Plan:   As requested will let PCP know that she plans to move care from [facility] [place] to [facility] for management of her bladder cancer.  Plans to contact [facility] today to schedule appointment  Encourage patient to continue to stay well hydrated   Diabetes: - Counseled patient on importance of having regular well-balanced meals, while controlling  carbohydrate portion sizes Encouraged patient to reduce intake of sugary beverages, to work on drinking water , rather than soda - Patient to monitor home blood sugar in morning before breakfast and at bedtime, keep log of results and have this to review at medical appointments and to call Endocrinologist sooner for reading outside of established parameters or symptoms  - Encourage patient to take atorvastatin  consistently for LDL control/ASCVD risk reduction   COPD: - Recommend to continue using Trelegy inhaler consistently every day    Tobacco Abuse - Provided motivational interviewing to assess tobacco use and strategies for reduction - Encourage patient to continue to use nicotine  patches. Advised may consider using OTC benefit to obtain nicotine  patches - Have provided information on 1 800 QUIT NOW support program     Follow Up Plan: Clinic Pharmacist will follow up with patient again by telephone on 02/16/2024 at 3:00 PM    Sharyle Sia, PharmD, [facility] Clinical Pharmacist [facility] [phone]

## 2024-01-28 ENCOUNTER — Other Ambulatory Visit: Payer: Self-pay | Admitting: Internal Medicine

## 2024-01-28 ENCOUNTER — Other Ambulatory Visit: Admitting: Urology

## 2024-01-28 DIAGNOSIS — U071 COVID-19: Secondary | ICD-10-CM

## 2024-01-28 DIAGNOSIS — K219 Gastro-esophageal reflux disease without esophagitis: Secondary | ICD-10-CM

## 2024-01-28 DIAGNOSIS — R0602 Shortness of breath: Secondary | ICD-10-CM

## 2024-01-30 NOTE — Telephone Encounter (Signed)
 Requested Prescriptions  Pending Prescriptions Disp Refills   pantoprazole  (PROTONIX ) 40 MG tablet [Pharmacy Med Name: PANTOPRAZOLE  SOD DR 40 MG TAB] 90 tablet 0    Sig: TAKE 1 TABLET (40 MG TOTAL) BY MOUTH DAILY AS NEEDED (HEARTBURN).     Gastroenterology: Proton Pump Inhibitors Passed - 01/30/2024 12:04 PM      Passed - Valid encounter within last 12 months    Recent Outpatient Visits           3 months ago Type 2 diabetes mellitus with diabetic polyneuropathy, without long-term current use of insulin  [facility])   [facility] [place], [place] W, NP               albuterol  (VENTOLIN  HFA) 108 [phone] Base) MCG/ACT inhaler [Pharmacy Med Name: ALBUTEROL  HFA (PROAIR ) INHALER] 8.5 each 0    Sig: INHALE 1-2 PUFFS BY MOUTH EVERY 6 HOURS AS NEEDED FOR WHEEZE OR SHORTNESS OF BREATH     Pulmonology:  Beta Agonists 2 Passed - 01/30/2024 12:04 PM      Passed - Last BP in normal range    BP Readings from Last 1 Encounters:  10/21/23 112/62         Passed - Last Heart Rate in normal range    Pulse Readings from Last 1 Encounters:  09/12/23 64         Passed - Valid encounter within last 12 months    Recent Outpatient Visits           3 months ago Type 2 diabetes mellitus with diabetic polyneuropathy, without long-term current use of insulin  [facility])   [facility] [place], Angeline ORN, [place]

## 2024-02-06 ENCOUNTER — Other Ambulatory Visit: Payer: Self-pay | Admitting: Internal Medicine

## 2024-02-06 NOTE — Telephone Encounter (Signed)
 Copied from CRM #[id number]. Topic: Clinical - Medication Refill >> Feb 06, 2024  9:21 AM Tiffini S wrote: Medication:  clonazePAM  (KLONOPIN ) 0.5 MG tablet  Has the patient contacted their pharmacy? Yes (Agent: If no, request that the patient contact the pharmacy for the refill. If patient does not wish to contact the pharmacy document the reason why and proceed with request.) (Agent: If yes, when and what did the pharmacy advise?)  This is the patient's preferred pharmacy:  [id number] - [place] - [address] [place] [address] Phone: [phone] Fax: [phone]  Is this the correct pharmacy for this prescription? Yes If no, delete pharmacy and type the correct one.   Has the prescription been filled recently? Yes  Is the patient out of the medication? Yes  Has the patient been seen for an appointment in the last year OR does the patient have an upcoming appointment? Yes  Can we respond through MyChart? Yes  Agent: Please be advised that Rx refills may take up to 3 business days. We ask that you follow-up with your pharmacy.

## 2024-02-07 NOTE — Telephone Encounter (Signed)
 Requested medication (s) are due for refill today: Yes  Requested medication (s) are on the active medication list: Yes  Last refill:  01/01/24  Future visit scheduled: Yes  Notes to clinic:  Unable to refill per protocol, cannot delegate.      Requested Prescriptions  Pending Prescriptions Disp Refills   clonazePAM  (KLONOPIN ) 0.5 MG tablet 30 tablet 0     Not Delegated - Psychiatry: Anxiolytics/Hypnotics 2 Failed - 02/07/2024  9:28 AM      Failed - This refill cannot be delegated      Failed - Urine Drug Screen completed in last 360 days      Passed - Patient is not pregnant      Passed - Valid encounter within last 6 months    Recent Outpatient Visits           3 months ago Type 2 diabetes mellitus with diabetic polyneuropathy, without long-term current use of insulin  [facility])   [facility] [place], Angeline ORN, [place]

## 2024-02-09 MED ORDER — CLONAZEPAM 0.5 MG PO TABS: 0.5000 mg | ORAL_TABLET | Freq: Every day | ORAL | 0 refills | Status: DC | PRN

## 2024-02-13 ENCOUNTER — Encounter: Payer: Self-pay | Admitting: Emergency Medicine

## 2024-02-13 ENCOUNTER — Ambulatory Visit: Payer: Self-pay

## 2024-02-13 DIAGNOSIS — F17200 Nicotine dependence, unspecified, uncomplicated: Secondary | ICD-10-CM | POA: Diagnosis not present

## 2024-02-13 DIAGNOSIS — Z0000 Encounter for general adult medical examination without abnormal findings: Secondary | ICD-10-CM | POA: Diagnosis not present

## 2024-02-13 DIAGNOSIS — Z1211 Encounter for screening for malignant neoplasm of colon: Secondary | ICD-10-CM

## 2024-02-13 NOTE — Patient Instructions (Addendum)
 Brandi Calderon,  Thank you for taking the time for your Medicare Wellness Visit. I appreciate your continued commitment to your health goals. Please review the care plan we discussed, and feel free to reach out if I can assist you further.  Please note that Annual Wellness Visits do not include a physical exam. Some assessments may be limited, especially if the visit was conducted virtually. If needed, we may recommend an in-person follow-up with your provider.  Ongoing Care Seeing your primary care provider every 3 to 6 months helps us  monitor your health and provide consistent, personalized care.   Referrals If a referral was made during today's visit and you haven't received any updates within two weeks, please contact the referred provider directly to check on the status. REFERRAL SENT FOR COLONOSCOPY & LUNG CA SCREENING PLEASE CONTACT [facility] TO SCHEDULE YOUR MAMMOGRAM @ [phone]  Recommended Screenings:  Health Maintenance  Topic Date Due   Breast Cancer Screening  Never done   DTaP/Tdap/Td vaccine (1 - Tdap) Never done   Hepatitis B Vaccine (1 of 3 - 19+ 3-dose series) Never done   Zoster (Shingles) Vaccine (1 of 2) Never done   COVID-19 Vaccine (2 - Pfizer risk series) 12/01/2019   Colon Cancer Screening  05/06/2021   Pneumococcal Vaccine for age over 73 (2 of 2 - PCV) 03/15/2023   Screening for Lung Cancer  05/21/2023   Flu Shot  10/31/2023   Eye exam for diabetics  12/31/2023   Complete foot exam   04/02/2024   Hemoglobin A1C  04/22/2024   Pap with HPV screening  09/20/2024   Yearly kidney function blood test for diabetes  10/20/2024   Yearly kidney health urinalysis for diabetes  11/05/2024   Medicare Annual Wellness Visit  02/12/2025   Hepatitis C Screening  Completed   HIV Screening  Completed   HPV Vaccine  Aged Out   Meningitis B Vaccine  Aged Out    Vision: Annual vision screenings are recommended for early detection of glaucoma, cataracts, and  diabetic retinopathy. These exams can also reveal signs of chronic conditions such as diabetes and high blood pressure.  Dental: Annual dental screenings help detect early signs of oral cancer, gum disease, and other conditions linked to overall health, including heart disease and diabetes.  Please see the attached documents for additional preventive care recommendations.   NEXT AWV 02/17/24 @ 11:30 AM IN PERSON Take care!

## 2024-02-13 NOTE — Progress Notes (Signed)
 No chief complaint on file.    Subjective:   Brandi Calderon is a 58 y.o. female who presents for a Medicare Annual Wellness Visit.  Allergies (verified) Penicillins, Victoza [liraglutide], and Vicodin [hydrocodone -acetaminophen ]   History: Past Medical History:  Diagnosis Date   Anxiety    Aortic atherosclerosis    Arthritis    Asthma    Back pain    Bladder cancer (HCC)    Bladder tumor    COPD (chronic obstructive pulmonary disease) (HCC)    Depression    GERD (gastroesophageal reflux disease)    Headache    History of COVID-19    Hyperlipidemia    Hypertension    Neuromuscular disorder (HCC)    Sleep apnea    Tendonitis    Type 2 diabetes mellitus without complication    Urinary tract infection due to extended-spectrum beta lactamase (ESBL) producing Escherichia coli 05/22/2022   Past Surgical History:  Procedure Laterality Date   BLADDER INSTILLATION N/A 01/03/2023   Procedure: BLADDER INSTILLATION OF GEMCITABINE ;  Surgeon: Francisca Redell BROCKS, MD;  Location: [facility];  Service: Urology;  Laterality: N/A;   BLADDER INSTILLATION N/A 09/12/2023   Procedure: INSTILLATION, BLADDER;  Surgeon: Francisca Redell BROCKS, MD;  Location: [facility];  Service: Urology;  Laterality: N/A;  GEMCITABINE    CESAREAN SECTION     3 times   COLONOSCOPY     cyst on neck     as a baby/right side   CYSTOSCOPY WITH BIOPSY N/A 07/26/2022   Procedure: CYSTOSCOPY WITH BLADDER BIOPSY;  Surgeon: Francisca Redell BROCKS, MD;  Location: [facility];  Service: Urology;  Laterality: N/A;   CYSTOSCOPY WITH BIOPSY N/A 09/12/2023   Procedure: CYSTOSCOPY, WITH BIOPSY;  Surgeon: Francisca Redell BROCKS, MD;  Location: [facility];  Service: Urology;  Laterality: N/A;   TRANSURETHRAL RESECTION OF BLADDER TUMOR N/A 06/07/2022   Procedure: TRANSURETHRAL RESECTION OF BLADDER TUMOR (TURBT);  Surgeon: Francisca Redell BROCKS, MD;  Location: [facility];  Service: Urology;  Laterality: N/A;   TRANSURETHRAL RESECTION OF BLADDER TUMOR N/A  01/03/2023   Procedure: TRANSURETHRAL RESECTION OF BLADDER TUMOR (TURBT);  Surgeon: Francisca Redell BROCKS, MD;  Location: [facility];  Service: Urology;  Laterality: N/A;   Family History  Problem Relation Age of Onset   Diabetes Mother    Kidney disease Father    Diabetes Father    Dementia Father    Diabetes Sister    Diabetes Brother    Diabetes Brother    Colon cancer Neg Hx    Esophageal cancer Neg Hx    Stomach cancer Neg Hx    Rectal cancer Neg Hx    Social History   Occupational History   Not on file  Tobacco Use   Smoking status: Every Day    Current packs/day: 1.00    Average packs/day: 1 pack/day for 41.9 years (41.9 ttl pk-yrs)    Types: Cigarettes    Start date: 1984    Passive exposure: Current   Smokeless tobacco: Never   Tobacco comments:    1PPD 06/10/2022    1.5 PPD 09/04/23  Vaping Use   Vaping status: Never Used  Substance and Sexual Activity   Alcohol use: No   Drug use: Yes    Types: Marijuana    Comment: States she smokes MJ when she does not have any Tramadol .   Sexual activity: Yes   Tobacco Counseling Ready to quit: Not Answered Counseling given: Not Answered Tobacco comments: 1PPD 06/10/2022 1.5 PPD 09/04/23  SDOH Screenings   Food Insecurity: No Food Insecurity (07/25/2023)  Housing: Low Risk  (07/25/2023)  Transportation Needs: No Transportation Needs (07/25/2023)  Utilities: Not At Risk (07/25/2023)  Alcohol Screen: Low Risk  (02/07/2023)  Depression (PHQ2-9): Medium Risk (10/21/2023)  Financial Resource Strain: Low Risk  (02/24/2023)   [facility] from [facility]  Physical Activity: Insufficiently Active (02/07/2023)  Social Connections: Socially Isolated (02/07/2023)  Stress: Stress Concern Present (02/07/2023)  Tobacco Use: High Risk (02/05/2024)   Received from [facility]  Health Literacy: Adequate Health Literacy (02/07/2023)   See flowsheets for full screening details  Depression Screen PHQ 2 & 9  Depression Scale- Over the past 2 weeks, how often have you been bothered by any of the following problems? Little interest or pleasure in doing things: 0 Feeling down, depressed, or hopeless (PHQ Adolescent also includes...irritable): 0 PHQ-2 Total Score: 0 Trouble falling or staying asleep, or sleeping too much: 3 Feeling tired or having little energy: 1 Poor appetite or overeating (PHQ Adolescent also includes...weight loss): 0 Feeling bad about yourself - or that you are a failure or have let yourself or your family down: 0 Trouble concentrating on things, such as reading the newspaper or watching television (PHQ Adolescent also includes...like school work): 1 Moving or speaking so slowly that other people could have noticed. Or the opposite - being so fidgety or restless that you have been moving around a lot more than usual: 0 Thoughts that you would be better off dead, or of hurting yourself in some way: 0 PHQ-9 Total Score: 4 If you checked off any problems, how difficult have these problems made it for you to do your work, take care of things at home, or get along with other people?: Somewhat difficult     Goals Addressed   None    Fall Screening Falls in the past year?: 0 Number of falls in past year: 0 Was there an injury with Fall?: 0 Fall Risk Category Calculator: 0 Patient Fall Risk Level: High fall risk  Advance Directives (For Healthcare) Does Patient Have a Medical Advance Directive?: No Would patient like information on creating a medical advance directive?: No - Patient declined        Objective:    There were no vitals filed for this visit. There is no height or weight on file to calculate BMI.  Current Medications (verified) Outpatient Encounter Medications as of 02/13/2024  Medication Sig   albuterol  (VENTOLIN  HFA) 108 (90 Base) MCG/ACT inhaler INHALE 1-2 PUFFS BY MOUTH EVERY 6 HOURS AS NEEDED FOR WHEEZE OR SHORTNESS OF BREATH   aspirin  EC 81 MG tablet  Take 1 tablet (81 mg total) by mouth daily. Swallow whole.   atorvastatin  (LIPITOR) 80 MG tablet Take 80 mg by mouth at bedtime.   Blood Glucose Monitoring Suppl (ONETOUCH VERIO FLEX SYSTEM) w/Device KIT Use to check blood sugar daily.  DX E11.9   clonazePAM  (KLONOPIN ) 0.5 MG tablet Take 1 tablet (0.5 mg total) by mouth daily as needed for anxiety.   DULoxetine (CYMBALTA) 30 MG capsule Take 30 mg by mouth at bedtime.   fluticasone  (FLONASE ) 50 MCG/ACT nasal spray SPRAY 2 SPRAYS INTO EACH NOSTRIL EVERY DAY   gabapentin  (NEURONTIN ) 300 MG capsule Take 300 mg by mouth at bedtime.   glipiZIDE  (GLUCOTROL  XL) 5 MG 24 hr tablet Take 5 mg by mouth daily with breakfast.   glucose blood test strip Use as instructed   JARDIANCE 25 MG TABS tablet Take  25 mg by mouth every morning.   Lancets (ONETOUCH ULTRASOFT) lancets Use as instructed   lisinopril (ZESTRIL) 5 MG tablet Take 5 mg by mouth every morning.   metFORMIN  (GLUCOPHAGE ) 1000 MG tablet TAKE 1 TABLET (1,000 MG TOTAL) BY MOUTH 2 (TWO) TIMES DAILY WITH A MEAL.   ondansetron  (ZOFRAN ) 4 MG tablet Take 4 mg by mouth every 8 (eight) hours as needed for nausea.   oxybutynin  (DITROPAN  XL) 15 MG 24 hr tablet TAKE 1 TABLET (15 MG TOTAL) BY MOUTH DAILY.   OXYGEN  Inhale 1 L into the lungs at bedtime.   pantoprazole  (PROTONIX ) 40 MG tablet TAKE 1 TABLET (40 MG TOTAL) BY MOUTH DAILY AS NEEDED (HEARTBURN).   polyethylene glycol (MIRALAX ) 17 g packet Take 17 g by mouth daily.   Semaglutide, 2 MG/DOSE, 8 MG/3ML SOPN Inject 2 mg into the skin once a week. Friday   TRELEGY ELLIPTA  100-62.5-25 MCG/ACT AEPB TAKE 1 PUFF BY MOUTH EVERY DAY   No facility-administered encounter medications on file as of 02/13/2024.   Hearing/Vision screen No results found. Immunizations and Health Maintenance Health Maintenance  Topic Date Due   Mammogram  Never done   DTaP/Tdap/Td (1 - Tdap) Never done   Hepatitis B Vaccines 19-59 Average Risk (1 of 3 - 19+ 3-dose series) Never  done   Zoster Vaccines- Shingrix (1 of 2) Never done   COVID-19 Vaccine (2 - Pfizer risk series) 12/01/2019   Colonoscopy  05/06/2021   Pneumococcal Vaccine: 50+ Years (2 of 2 - PCV) 03/15/2023   Lung Cancer Screening  05/21/2023   Influenza Vaccine  10/31/2023   OPHTHALMOLOGY EXAM  12/31/2023   Medicare Annual Wellness (AWV)  02/07/2024   FOOT EXAM  04/02/2024   HEMOGLOBIN A1C  04/22/2024   Cervical Cancer Screening (HPV/Pap Cotest)  09/20/2024   Diabetic kidney evaluation - eGFR measurement  10/20/2024   Diabetic kidney evaluation - Urine ACR  11/05/2024   Hepatitis C Screening  Completed   HIV Screening  Completed   HPV VACCINES  Aged Out   Meningococcal B Vaccine  Aged Out        Assessment/Plan:  This is a routine wellness examination for Breawna.  Patient Care Team: [name] ORN, NP as PCP - General (Internal Medicine) Dr. Neomi (Psychiatry) Dr. Arloa (Gynecology) Berl Todd BROCKS, MD (Internal Medicine) Alana, Sharyle LABOR, RPH-CPP as Pharmacist (Pharmacist)  I have personally reviewed and noted the following in the patient's chart:   Medical and social history Use of alcohol, tobacco or illicit drugs  Current medications and supplements including opioid prescriptions. Functional ability and status Nutritional status Physical activity Advanced directives List of other physicians Hospitalizations, surgeries, and ER visits in previous 12 months Vitals Screenings to include cognitive, depression, and falls Referrals and appointments  No orders of the defined types were placed in this encounter.  In addition, I have reviewed and discussed with patient certain preventive protocols, quality metrics, and best practice recommendations. A written personalized care plan for preventive services as well as general preventive health recommendations were provided to patient.   Jhonnie GORMAN Das, LPN   88/85/7974   No follow-ups on file.  After Visit Summary: (MyChart)  Due to this being a telephonic visit, the after visit summary with patients personalized plan was offered to patient via MyChart   Nurse Notes: NEEDS FLU SHOT; HAS ORDER FOR MAMMOGRAM; REFERRAL FOR COLONOSCOPY & LUNG CA SCREENING

## 2024-02-16 ENCOUNTER — Other Ambulatory Visit: Admitting: Pharmacist

## 2024-02-16 DIAGNOSIS — J449 Chronic obstructive pulmonary disease, unspecified: Secondary | ICD-10-CM

## 2024-02-16 DIAGNOSIS — E1142 Type 2 diabetes mellitus with diabetic polyneuropathy: Secondary | ICD-10-CM

## 2024-02-16 DIAGNOSIS — Z7985 Long-term (current) use of injectable non-insulin antidiabetic drugs: Secondary | ICD-10-CM

## 2024-02-16 NOTE — Progress Notes (Unsigned)
 02/16/2024 Name: Brandi Calderon MRN: [id number] DOB: 1965-06-07  Chief Complaint  Patient presents with   Medication Management    Brandi Calderon is a 58 y.o. year old female who presented for a telephone visit.   They were referred to the pharmacist by their PCP for assistance in managing diabetes, hypertension, and hyperlipidemia.    Speak with patient only briefly today as reports that she is not currently home   Subjective:   Care Team: Primary Care Provider: [name] ORN, Calderon; Next Scheduled Visit: 04/05/2024 Pulmonologist: Brandi Calderon: Dr. Damian; Next Scheduled Visit: 05/10/2024  Medication Access/Adherence  Current Pharmacy:  [id number] - [place], [place] - [address] [place] [address] Phone: [phone] Fax: [phone]   Patient reports affordability concerns with their medications: No  Patient reports access/transportation concerns to their pharmacy: No  Patient reports adherence concerns with their medications:  No     Patient using weekly pillbox to organize her medications. Admits occasionally misses a dose of her metformin    Reports has not yet picked up, but plans to start OTC Vitamin D and Vitamin B12 as recommended by Calderon     Diabetes: Patient followed by [facility] for management of T2DM   Current medications:  Metformin  1000 mg twice daily Ozempic 2 mg once weekly Reports tolerating; using ondansetron  as needed as directed by Calderon Jardiance 25 mg daily glipizide  ER 5 mg daily  Medications tried in the past: Trulicity    Recalls morning fasting blood sugars today: 145    Denies symptoms of hypoglycemia - Carries glucose tablets   Statin: atorvastatin  80 mg - taking 1/2 tablet twice daily and tolerating dose   ACE-I: lisinopril 5 mg daily     COPD:   Current medications: Trelegy - 1 puff daily Albuterol  - 1-2 puffs every 6 hours as  needed Overnight oxygen    Reports using Trelegy consistently and rinsing mouth after each use and using rescue (albuterol ) inhaler as needed as directed     Tobacco Abuse: Currently smoking ~1 pack/day Triggers: boredom, others smoking around her, stress Reports using nicotine  patches, which has helped her with cutting back Denies interest in using Chantix    Objective:  Lab Results  Component Value Date   HGBA1C 7.0 (H) 10/21/2023    Lab Results  Component Value Date   CREATININE 1.29 (H) 10/21/2023   BUN 19 10/21/2023   NA 138 10/21/2023   K 4.3 10/21/2023   CL 99 10/21/2023   CO2 29 10/21/2023    Lab Results  Component Value Date   CHOL 189 10/21/2023   HDL 44 (L) 10/21/2023   LDLCALC 123 (H) 10/21/2023   TRIG 117 10/21/2023   CHOLHDL 4.3 10/21/2023    Medications Reviewed Today     Reviewed by Brandi Calderon (Pharmacist) on 02/16/24 at 1514  Med List Status: <None>   Medication Order Taking? Sig Documenting Provider Last Dose Status Informant  albuterol  (VENTOLIN  HFA) 108 (90 Base) MCG/ACT inhaler [id number]  INHALE 1-2 PUFFS BY MOUTH EVERY 6 HOURS AS NEEDED FOR WHEEZE OR SHORTNESS OF BREATH Brandi Calderon  Active   aspirin  EC 81 MG tablet [id number]  Take 1 tablet (81 mg total) by mouth daily. Swallow whole. Brandi Calderon  Active Self  atorvastatin  (LIPITOR) 80 MG tablet [id number]  Take 80 mg by mouth at bedtime. Provider, Historical, Calderon  Active Self  Blood Glucose Monitoring Suppl [facility]  VERIO FLEX SYSTEM) w/Device KIT [id number]  Use to check blood sugar daily.  DX E11.9 Brandi Calderon  Active Self  clonazePAM  (KLONOPIN ) 0.5 MG tablet [id number]  Take 1 tablet (0.5 mg total) by mouth daily as needed for anxiety. Brandi Calderon  Active   DULoxetine (CYMBALTA) 30 MG capsule [id number]  Take 30 mg by mouth at bedtime. Provider, Historical, Calderon  Active Self  fluticasone  (FLONASE ) 50 MCG/ACT nasal spray [id number]  SPRAY 2 SPRAYS INTO  EACH NOSTRIL EVERY DAY Brandi Calderon  Active   gabapentin  (NEURONTIN ) 300 MG capsule [id number]  Take 300 mg by mouth at bedtime. Provider, Historical, Calderon  Active Self  glipiZIDE  (GLUCOTROL  XL) 5 MG 24 hr tablet [id number] Yes Take 5 mg by mouth daily with breakfast. Provider, Historical, Calderon  Active Self  glucose blood test strip [id number]  Use as instructed Brandi Calderon  Active Self  JARDIANCE 25 MG TABS tablet [id number] Yes Take 25 mg by mouth every morning. Provider, Historical, Calderon  Active Self  Lancets JANETT Calderon) lancets [id number]  Use as instructed Brandi Calderon  Active Self  lisinopril (ZESTRIL) 5 MG tablet [id number]  Take 5 mg by mouth every morning. Provider, Historical, Calderon  Active Self  metFORMIN  (GLUCOPHAGE ) 1000 MG tablet [id number] Yes TAKE 1 TABLET (1,000 MG TOTAL) BY MOUTH 2 (TWO) TIMES DAILY WITH A MEAL. Brandi Calderon  Active Self  ondansetron  (ZOFRAN ) 4 MG tablet [id number]  Take 4 mg by mouth every 8 (eight) hours as needed for nausea. Provider, Historical, Calderon  Active Self  oxybutynin  (DITROPAN  XL) 15 MG 24 hr tablet [id number]  TAKE 1 TABLET (15 MG TOTAL) BY MOUTH DAILY. Brandi Calderon  Active   OXYGEN  [id number]  Inhale 1 L into the lungs at bedtime. Provider, Historical, Calderon  Active Self  pantoprazole  (PROTONIX ) 40 MG tablet [id number]  TAKE 1 TABLET (40 MG TOTAL) BY MOUTH DAILY AS NEEDED (HEARTBURN). Brandi Calderon  Active   polyethylene glycol (MIRALAX ) 17 g packet [id number]  Take 17 g by mouth daily. Brandi Calderon  Active Self  Semaglutide, 2 MG/DOSE, 8 MG/3ML NELMA [id number] Yes Inject 2 mg into the skin once a week. Friday Provider, Historical, Calderon  Active Self  TRELEGY ELLIPTA  100-62.5-25 MCG/ACT AEPB [id number] Yes TAKE 1 PUFF BY MOUTH EVERY DAY Brandi Calderon  Active   Med List Note Brandi Calderon 06/25/18 [id number]): UDS 06-25-2018              Assessment/Plan:   Discuss importance of medication adherence  Plans  to follow up with [facility] to schedule appointment with Dr Brandi for follow up regarding bladder cancer     Diabetes: - Counseled patient on importance of having regular well-balanced meals, while controlling carbohydrate portion sizes Encouraged patient to reduce intake of sugary beverages, to work on drinking water , rather than soda - Patient to monitor home blood sugar in morning before breakfast and at bedtime, keep log of results and have this to review at medical appointments and to call Calderon sooner for reading outside of established parameters or symptoms  - Encourage patient to take atorvastatin  consistently for LDL control/ASCVD risk reduction   COPD: - Recommend to continue using Trelegy inhaler consistently every day    Tobacco Abuse - Provided motivational interviewing to assess tobacco use and strategies for reduction - Encourage patient to restart nicotine  patches. Advised may  consider using OTC benefit to obtain nicotine  patches - Have provided information on 1 800 QUIT NOW support program     Follow Up Plan: Clinic Pharmacist will follow up with patient again by telephone in March 2026   Sharyle Sia, PharmD, [facility] Clinical Pharmacist [facility] [phone]

## 2024-02-17 NOTE — Patient Instructions (Signed)
Goals Addressed             This Visit's Progress    Pharmacy Goals       Our goal A1c is less than 7%. This corresponds with fasting sugars less than 130 and 2 hour after meal sugars less than 180. Please check your blood sugar each morning  Our goal bad cholesterol, or LDL, is less than 70 . This is why it is important to continue taking your atorvastatin  Please follow up with the [place] Quitline for their help with quitting smoking.  The [place] Quitline phone number is: 1-800-784-8669  Feel free to call me with any questions or concerns. I look forward to our next call!  Elisabeth Delles, PharmD, BCACP Clinical Pharmacist [facility] [place] [phone]        

## 2024-02-18 ENCOUNTER — Other Ambulatory Visit: Payer: Self-pay

## 2024-02-18 ENCOUNTER — Telehealth: Payer: Self-pay

## 2024-02-18 DIAGNOSIS — Z860100 Personal history of colon polyps, unspecified: Secondary | ICD-10-CM

## 2024-02-18 MED ORDER — NA SULFATE-K SULFATE-MG SULF 17.5-3.13-1.6 GM/177ML PO SOLN: 1.0000 | Freq: Once | ORAL | 0 refills | Status: AC

## 2024-02-18 NOTE — Telephone Encounter (Signed)
 Gastroenterology Pre-Procedure Review  Request Date: 05/31/24 Requesting Physician: Dr. Jinny  PATIENT REVIEW QUESTIONS: The patient responded to the following health history questions as indicated:    1. Are you having any GI issues? no 2. Do you have a personal history of Polyps? yes (Last colonoscopy 05/06/18 with Dr. Eda) 3. Do you have a family history of Colon Cancer or Polyps? Personal history of bladder CA 4. Diabetes Mellitus? IMPORTANT MEDICATION REMINDERS: -(7) DAYS PRIOR STOP SEMAGLUTIDE -(3) DAYS PRIOR STOP JARDIANCE -(2) DAYS PRIOR STOP METFORMIN  5. Joint replacements in the past 12 months?no 6. Major health problems in the past 3 months?no 7. Any artificial heart valves, MVP, or defibrillator?no    MEDICATIONS & ALLERGIES:    Patient reports the following regarding taking any anticoagulation/antiplatelet therapy:   Plavix, Coumadin, Eliquis, Xarelto, Lovenox, Pradaxa, Brilinta, or Effient? no Aspirin ? yes (81 mg daily)  Patient confirms/reports the following medications:  Current Outpatient Medications  Medication Sig Dispense Refill   albuterol  (VENTOLIN  HFA) 108 (90 Base) MCG/ACT inhaler INHALE 1-2 PUFFS BY MOUTH EVERY 6 HOURS AS NEEDED FOR WHEEZE OR SHORTNESS OF BREATH 8.5 each 0   aspirin  EC 81 MG tablet Take 1 tablet (81 mg total) by mouth daily. Swallow whole. 30 tablet 12   atorvastatin  (LIPITOR) 80 MG tablet Take 80 mg by mouth at bedtime.     Blood Glucose Monitoring Suppl (ONETOUCH VERIO FLEX SYSTEM) w/Device KIT Use to check blood sugar daily.  DX E11.9 1 kit 1   clonazePAM  (KLONOPIN ) 0.5 MG tablet Take 1 tablet (0.5 mg total) by mouth daily as needed for anxiety. 30 tablet 0   DULoxetine (CYMBALTA) 30 MG capsule Take 30 mg by mouth at bedtime.     fluticasone  (FLONASE ) 50 MCG/ACT nasal spray SPRAY 2 SPRAYS INTO EACH NOSTRIL EVERY DAY 48 mL 0   gabapentin  (NEURONTIN ) 300 MG capsule Take 300 mg by mouth at bedtime.     glipiZIDE  (GLUCOTROL  XL) 5 MG 24 hr  tablet Take 5 mg by mouth daily with breakfast.     glucose blood test strip Use as instructed 100 each 0   JARDIANCE 25 MG TABS tablet Take 25 mg by mouth every morning.     Lancets (ONETOUCH ULTRASOFT) lancets Use as instructed 100 each 12   lisinopril (ZESTRIL) 5 MG tablet Take 5 mg by mouth every morning.     metFORMIN  (GLUCOPHAGE ) 1000 MG tablet TAKE 1 TABLET (1,000 MG TOTAL) BY MOUTH 2 (TWO) TIMES DAILY WITH A MEAL. 60 tablet 0   ondansetron  (ZOFRAN ) 4 MG tablet Take 4 mg by mouth every 8 (eight) hours as needed for nausea.     oxybutynin  (DITROPAN  XL) 15 MG 24 hr tablet TAKE 1 TABLET (15 MG TOTAL) BY MOUTH DAILY. 90 tablet 3   OXYGEN  Inhale 1 L into the lungs at bedtime.     pantoprazole  (PROTONIX ) 40 MG tablet TAKE 1 TABLET (40 MG TOTAL) BY MOUTH DAILY AS NEEDED (HEARTBURN). 90 tablet 0   polyethylene glycol (MIRALAX ) 17 g packet Take 17 g by mouth daily. 14 each 0   Semaglutide, 2 MG/DOSE, 8 MG/3ML SOPN Inject 2 mg into the skin once a week. Friday     TRELEGY ELLIPTA  100-62.5-25 MCG/ACT AEPB TAKE 1 PUFF BY MOUTH EVERY DAY 60 each 6   No current facility-administered medications for this visit.    Patient confirms/reports the following allergies:  Allergies  Allergen Reactions   Penicillins Anaphylaxis    Has patient had a PCN reaction  causing immediate rash, facial/tongue/throat swelling, SOB or lightheadedness with hypotension: Yes Has patient had a PCN reaction causing severe rash involving mucus membranes or skin necrosis: No Has patient had a PCN reaction that required hospitalization: No Has patient had a PCN reaction occurring within the last 10 years: No If all of the above answers are NO, then may proceed with Cephalosporin use.   Throat swells   Victoza [Liraglutide] Nausea Only    Severe nausea   Vicodin [Hydrocodone -Acetaminophen ] Hives and Rash    And hives    No orders of the defined types were placed in this encounter.   AUTHORIZATION  INFORMATION Primary Insurance: 1D#: Group #:  Secondary Insurance: 1D#: Group #:  SCHEDULE INFORMATION: Date: 05/31/24 Time: Location: [facility]

## 2024-02-23 NOTE — Telephone Encounter (Signed)
 Patient called to check status of her BCG treatments. Is she ok to schedule? Please advise.

## 2024-02-28 ENCOUNTER — Other Ambulatory Visit: Payer: Self-pay | Admitting: Internal Medicine

## 2024-02-28 DIAGNOSIS — R0602 Shortness of breath: Secondary | ICD-10-CM

## 2024-02-28 DIAGNOSIS — U071 COVID-19: Secondary | ICD-10-CM

## 2024-03-03 NOTE — Telephone Encounter (Signed)
 Requested Prescriptions  Pending Prescriptions Disp Refills   albuterol  (VENTOLIN  HFA) 108 (90 Base) MCG/ACT inhaler [Pharmacy Med Name: ALBUTEROL  HFA (PROAIR ) INHALER] 8.5 each 0    Sig: INHALE 1-2 PUFFS BY MOUTH EVERY 6 HOURS AS NEEDED FOR WHEEZE OR SHORTNESS OF BREATH     Pulmonology:  Beta Agonists 2 Passed - 03/03/2024 10:42 AM      Passed - Last BP in normal range    BP Readings from Last 1 Encounters:  10/21/23 112/62         Passed - Last Heart Rate in normal range    Pulse Readings from Last 1 Encounters:  09/12/23 64         Passed - Valid encounter within last 12 months    Recent Outpatient Visits           4 months ago Type 2 diabetes mellitus with diabetic polyneuropathy, without long-term current use of insulin  [facility])   [place] [facility] [place], Angeline ORN, [place]

## 2024-03-04 ENCOUNTER — Other Ambulatory Visit: Payer: Self-pay | Admitting: Internal Medicine

## 2024-03-08 NOTE — Telephone Encounter (Signed)
 Requested medication (s) are due for refill today: yes  Requested medication (s) are on the active medication list: yes  Last refill:  02/09/24  Future visit scheduled: yes  Notes to clinic:  Unable to refill per protocol, cannot delegate.      Requested Prescriptions  Pending Prescriptions Disp Refills   clonazePAM  (KLONOPIN ) 0.5 MG tablet [Pharmacy Med Name: CLONAZEPAM  0.5 MG TABLET] 30 tablet 0    Sig: TAKE 1 TABLET BY MOUTH EVERY DAY AS NEEDED FOR ANXIETY     Not Delegated - Psychiatry: Anxiolytics/Hypnotics 2 Failed - 03/08/2024  9:18 AM      Failed - This refill cannot be delegated      Failed - Urine Drug Screen completed in last 360 days      Passed - Patient is not pregnant      Passed - Valid encounter within last 6 months    Recent Outpatient Visits           4 months ago Type 2 diabetes mellitus with diabetic polyneuropathy, without long-term current use of insulin  [facility])   [place] [facility] [place], Angeline ORN, [place]

## 2024-03-10 ENCOUNTER — Telehealth: Payer: Self-pay | Admitting: Urology

## 2024-03-10 NOTE — Telephone Encounter (Signed)
 Scheduled BCG

## 2024-03-10 NOTE — Telephone Encounter (Signed)
 Patient called this morning with question about BCG treatments. She said it has been almost 6 months, and she has not been able to get scheduled. She is concerned because it has been so long, and she hasn't heard anything from our office. She asked if she needs to schedule an appointment with Dr. Francisca to be seen. Please advise patient.

## 2024-03-10 NOTE — Telephone Encounter (Signed)
 Can you assist this patient with BCG scheduling? Thanks

## 2024-03-28 ENCOUNTER — Other Ambulatory Visit: Payer: Self-pay | Admitting: Internal Medicine

## 2024-03-28 DIAGNOSIS — R0602 Shortness of breath: Secondary | ICD-10-CM

## 2024-03-28 DIAGNOSIS — U071 COVID-19: Secondary | ICD-10-CM

## 2024-03-30 ENCOUNTER — Telehealth: Payer: Self-pay

## 2024-03-30 NOTE — Telephone Encounter (Signed)
 Auth Submission: APPROVED Site of care: Urology Payer: UHC dual complete medicare Medication & CPT/J Code(s) submitted: BCG J9030 Diagnosis Code:  Route of submission (phone, fax, portal): portal Phone # Fax # Auth type: Buy/Bill PB Units/visits requested:  Reference number: [id number] Approval from: 03/30/24 to 03/30/25

## 2024-03-30 NOTE — Telephone Encounter (Signed)
 Requested medication (s) are due for refill today: routing for review  Requested medication (s) are on the active medication list: yes  Last refill:  03/03/24  Future visit scheduled: no  Notes to clinic:  should patient continue to take? Routing for review.     Requested Prescriptions  Pending Prescriptions Disp Refills   albuterol  (VENTOLIN  HFA) 108 (90 Base) MCG/ACT inhaler [Pharmacy Med Name: ALBUTEROL  HFA (PROAIR ) INHALER] 8.5 each 0    Sig: INHALE 1-2 PUFFS BY MOUTH EVERY 6 HOURS AS NEEDED FOR WHEEZE OR SHORTNESS OF BREATH     Pulmonology:  Beta Agonists 2 Passed - 03/30/2024 11:54 AM      Passed - Last BP in normal range    BP Readings from Last 1 Encounters:  10/21/23 112/62         Passed - Last Heart Rate in normal range    Pulse Readings from Last 1 Encounters:  09/12/23 64         Passed - Valid encounter within last 12 months    Recent Outpatient Visits           5 months ago Type 2 diabetes mellitus with diabetic polyneuropathy, without long-term current use of insulin  [facility])   [facility] [place], Angeline ORN, NP       Future Appointments             Tomorrow Vaillancourt, Samantha, PA-C [facility] [place]   In 1 week Maurine Lukes, PA-C  [facility] [place]   In 2 weeks Maurine Lukes, PA-C  [facility] [place]

## 2024-03-31 ENCOUNTER — Ambulatory Visit: Admitting: Physician Assistant

## 2024-03-31 DIAGNOSIS — C679 Malignant neoplasm of bladder, unspecified: Secondary | ICD-10-CM | POA: Diagnosis not present

## 2024-03-31 DIAGNOSIS — D494 Neoplasm of unspecified behavior of bladder: Secondary | ICD-10-CM

## 2024-03-31 LAB — MICROSCOPIC EXAMINATION: Epithelial Cells (non renal): >10 /HPF — AB (ref 0–10)

## 2024-03-31 LAB — URINALYSIS, COMPLETE
Bilirubin, UA: NEGATIVE
Ketones, UA: NEGATIVE
Leukocytes,UA: NEGATIVE
Nitrite, UA: POSITIVE — AB
Protein,UA: NEGATIVE
Specific Gravity, UA: 1.03 (ref 1.005–1.030)
Urobilinogen, Ur: 1 mg/dL (ref 0.2–1.0)
pH, UA: 6 (ref 5.0–7.5)

## 2024-03-31 MED ORDER — BCG LIVE 50 MG IS SUSR
3.2400 mL | Freq: Once | INTRAVESICAL | Status: AC
  Administered 2024-03-31: 81 mg via INTRAVESICAL

## 2024-03-31 NOTE — Progress Notes (Signed)
 BCG Bladder Instillation  BCG # 1 of 3  Due to Bladder Cancer patient is present today for a BCG treatment. Patient was cleaned and prepped in a sterile fashion with betadine. A 14FR catheter was inserted, urine return was noted 5ml, urine was yellow in color.  50ml of reconstituted BCG was instilled into the bladder. The catheter was then removed. Patient tolerated well, no complications were noted  Performed by: Kalin Kyler, PA-C and Laymon Ned, CMA  Follow up/ Additional notes: 1 week

## 2024-04-01 ENCOUNTER — Other Ambulatory Visit: Payer: Self-pay | Admitting: Gastroenterology

## 2024-04-05 ENCOUNTER — Encounter: Payer: Self-pay | Admitting: Internal Medicine

## 2024-04-05 ENCOUNTER — Ambulatory Visit: Payer: Self-pay | Admitting: Physician Assistant

## 2024-04-05 ENCOUNTER — Ambulatory Visit (INDEPENDENT_AMBULATORY_CARE_PROVIDER_SITE_OTHER): Admitting: Internal Medicine

## 2024-04-05 VITALS — BP 110/64 | HR 77 | Ht 62.0 in | Wt 140.0 lb

## 2024-04-05 DIAGNOSIS — B9620 Unspecified Escherichia coli [E. coli] as the cause of diseases classified elsewhere: Secondary | ICD-10-CM | POA: Diagnosis not present

## 2024-04-05 DIAGNOSIS — Z6825 Body mass index (BMI) 25.0-25.9, adult: Secondary | ICD-10-CM

## 2024-04-05 DIAGNOSIS — J00 Acute nasopharyngitis [common cold]: Secondary | ICD-10-CM

## 2024-04-05 DIAGNOSIS — Z0001 Encounter for general adult medical examination with abnormal findings: Secondary | ICD-10-CM

## 2024-04-05 DIAGNOSIS — E663 Overweight: Secondary | ICD-10-CM

## 2024-04-05 DIAGNOSIS — E1142 Type 2 diabetes mellitus with diabetic polyneuropathy: Secondary | ICD-10-CM

## 2024-04-05 DIAGNOSIS — Z7984 Long term (current) use of oral hypoglycemic drugs: Secondary | ICD-10-CM

## 2024-04-05 DIAGNOSIS — Z122 Encounter for screening for malignant neoplasm of respiratory organs: Secondary | ICD-10-CM | POA: Diagnosis not present

## 2024-04-05 DIAGNOSIS — Z7985 Long-term (current) use of injectable non-insulin antidiabetic drugs: Secondary | ICD-10-CM | POA: Diagnosis not present

## 2024-04-05 DIAGNOSIS — N390 Urinary tract infection, site not specified: Secondary | ICD-10-CM | POA: Diagnosis not present

## 2024-04-05 LAB — CULTURE, URINE COMPREHENSIVE

## 2024-04-05 MED ORDER — DOXYCYCLINE HYCLATE 100 MG PO TABS: 100.0000 mg | ORAL_TABLET | Freq: Two times a day (BID) | ORAL | 0 refills | Status: AC

## 2024-04-05 NOTE — Assessment & Plan Note (Signed)
 Encourage diet and exercise for weight loss

## 2024-04-05 NOTE — Progress Notes (Signed)
 "  Subjective:    Patient ID: Brandi Calderon, female    DOB: 1966-03-01, 59 y.o.   MRN: [id number]  HPI  Patient presents to clinic today for annual exam.  She also reports headache, runny nose, right ear pain and cough.  She reports this started 5 days ago.  She is blowing green mucus out of her nose.  The cough is productive of green mucus as well.  She denies nasal congestion, sore throat, shortness of breath, nausea, vomiting or diarrhea.  She denies fever or chills but has had bodyaches.  She has tried Tylenol  OTC with minimal relief of symptoms.  She also reports that she recently had a urinalysis and urine culture done by urology.  The urine culture grew 50,000 200,000 colonies of E. coli.  She reports that she was not given an antibiotic to treat this infection.  Flu: 01/2023 Tetanus: > 10 years ago COVID: Pfizer x 1 Pneumovax: 03/2022 Prevnar 20: Never Shingrix: Never Pap smear: 08/2019 Mammogram: >2 years ago Colon screening: 05/2018 Vision screening: annually Dentist: dentures  Diet: She does eat meat. She consumes some fruits and veggies. She tries to avoid fried foods. She drinks mostly soda, coffee Exercise: None  Review of Systems     Past Medical History:  Diagnosis Date   Anxiety    Aortic atherosclerosis    Arthritis    Asthma    Back pain    Bladder cancer (HCC)    Bladder tumor    COPD (chronic obstructive pulmonary disease) (HCC)    Depression    GERD (gastroesophageal reflux disease)    Headache    History of COVID-19    Hyperlipidemia    Hypertension    Neuromuscular disorder (HCC)    Sleep apnea    Tendonitis    Type 2 diabetes mellitus without complication    Urinary tract infection due to extended-spectrum beta lactamase (ESBL) producing Escherichia coli 05/22/2022    Current Outpatient Medications  Medication Sig Dispense Refill   albuterol  (VENTOLIN  HFA) 108 (90 Base) MCG/ACT inhaler INHALE 1-2 PUFFS BY MOUTH EVERY 6 HOURS AS NEEDED FOR  WHEEZE OR SHORTNESS OF BREATH 8.5 each 0   aspirin  EC 81 MG tablet Take 1 tablet (81 mg total) by mouth daily. Swallow whole. 30 tablet 12   atorvastatin  (LIPITOR) 80 MG tablet Take 80 mg by mouth at bedtime.     Blood Glucose Monitoring Suppl (ONETOUCH VERIO FLEX SYSTEM) w/Device KIT Use to check blood sugar daily.  DX E11.9 1 kit 1   clonazePAM  (KLONOPIN ) 0.5 MG tablet TAKE 1 TABLET BY MOUTH EVERY DAY AS NEEDED FOR ANXIETY 30 tablet 0   DULoxetine (CYMBALTA) 30 MG capsule Take 30 mg by mouth at bedtime.     fluticasone  (FLONASE ) 50 MCG/ACT nasal spray SPRAY 2 SPRAYS INTO EACH NOSTRIL EVERY DAY 48 mL 0   gabapentin  (NEURONTIN ) 300 MG capsule Take 300 mg by mouth at bedtime.     glipiZIDE  (GLUCOTROL  XL) 5 MG 24 hr tablet Take 5 mg by mouth daily with breakfast.     glucose blood test strip Use as instructed 100 each 0   JARDIANCE 25 MG TABS tablet Take 25 mg by mouth every morning.     Lancets (ONETOUCH ULTRASOFT) lancets Use as instructed 100 each 12   lisinopril (ZESTRIL) 5 MG tablet Take 5 mg by mouth every morning.     metFORMIN  (GLUCOPHAGE ) 1000 MG tablet TAKE 1 TABLET (1,000 MG TOTAL) BY MOUTH 2 (TWO)  TIMES DAILY WITH A MEAL. 60 tablet 0   ondansetron  (ZOFRAN ) 4 MG tablet Take 4 mg by mouth every 8 (eight) hours as needed for nausea.     oxybutynin  (DITROPAN  XL) 15 MG 24 hr tablet TAKE 1 TABLET (15 MG TOTAL) BY MOUTH DAILY. 90 tablet 3   OXYGEN  Inhale 1 L into the lungs at bedtime.     pantoprazole  (PROTONIX ) 40 MG tablet TAKE 1 TABLET (40 MG TOTAL) BY MOUTH DAILY AS NEEDED (HEARTBURN). 90 tablet 0   polyethylene glycol (MIRALAX ) 17 g packet Take 17 g by mouth daily. 14 each 0   Semaglutide, 2 MG/DOSE, 8 MG/3ML SOPN Inject 2 mg into the skin once a week. Friday     TRELEGY ELLIPTA  100-62.5-25 MCG/ACT AEPB TAKE 1 PUFF BY MOUTH EVERY DAY 60 each 6   No current facility-administered medications for this visit.    Allergies  Allergen Reactions   Penicillins Anaphylaxis    Has patient  had a PCN reaction causing immediate rash, facial/tongue/throat swelling, SOB or lightheadedness with hypotension: Yes Has patient had a PCN reaction causing severe rash involving mucus membranes or skin necrosis: No Has patient had a PCN reaction that required hospitalization: No Has patient had a PCN reaction occurring within the last 10 years: No If all of the above answers are NO, then may proceed with Cephalosporin use.   Throat swells   Victoza [Liraglutide] Nausea Only    Severe nausea   Vicodin [Hydrocodone -Acetaminophen ] Hives and Rash    And hives    Family History  Problem Relation Age of Onset   Diabetes Mother    Kidney disease Father    Diabetes Father    Dementia Father    Diabetes Sister    Diabetes Brother    Diabetes Brother    Colon cancer Neg Hx    Esophageal cancer Neg Hx    Stomach cancer Neg Hx    Rectal cancer Neg Hx     Social History   Socioeconomic History   Marital status: Divorced    Spouse name: Not on file   Number of children: 3   Years of education: Not on file   Highest education level: Not on file  Occupational History   Not on file  Tobacco Use   Smoking status: Every Day    Current packs/day: 1.00    Average packs/day: 1 pack/day for 42.0 years (42.0 ttl pk-yrs)    Types: Cigarettes    Start date: 1984    Passive exposure: Current   Smokeless tobacco: Never   Tobacco comments:    1PPD 06/10/2022    1.5 PPD 09/04/23  Vaping Use   Vaping status: Never Used  Substance and Sexual Activity   Alcohol use: No   Drug use: Yes    Types: Marijuana    Comment: States she smokes MJ when she does not have any Tramadol .   Sexual activity: Yes  Other Topics Concern   Not on file  Social History Narrative   ** Merged History Encounter ** In the process of getting SSD for depression, DM and neuropathy      Active smoker [~ 2p/day; cutting down]; no alcohol; lives in [place]; self. Used to work in [employer].    Social Drivers of Health    Tobacco Use: High Risk (02/13/2024)   Patient History    Smoking Tobacco Use: Every Day    Smokeless Tobacco Use: Never    Passive Exposure: Current  Financial Resource Strain:  Low Risk  (02/24/2023)   Received from [facility]   Overall Financial Resource Strain (CARDIA)    Difficulty of Paying Living Expenses: Not hard at all  Food Insecurity: No Food Insecurity (02/13/2024)   Epic    Worried About Running Out of Food in the Last Year: Never true    Ran Out of Food in the Last Year: Never true  Transportation Needs: No Transportation Needs (02/13/2024)   Epic    Lack of Transportation (Medical): No    Lack of Transportation (Non-Medical): No  Physical Activity: Insufficiently Active (02/13/2024)   Exercise Vital Sign    Days of Exercise per Week: 3 days    Minutes of Exercise per Session: 30 min  Stress: No Stress Concern Present (02/13/2024)   [employer] of Occupational Health - Occupational Stress Questionnaire    Feeling of Stress: Only a little  Social Connections: Socially Isolated (02/13/2024)   Social Connection and Isolation Panel    Frequency of Communication with Friends and Family: Twice a week    Frequency of Social Gatherings with Friends and Family: Once a week    Attends Religious Services: Never    Database Administrator or Organizations: No    Attends Banker Meetings: Never    Marital Status: Divorced  Catering Manager Violence: Not At Risk (02/13/2024)   Epic    Fear of Current or Ex-Partner: No    Emotionally Abused: No    Physically Abused: No    Sexually Abused: No  Depression (PHQ2-9): Low Risk (02/13/2024)   Depression (PHQ2-9)    PHQ-2 Score: 3  Alcohol Screen: Low Risk (02/07/2023)   Alcohol Screen    Last Alcohol Screening Score (AUDIT): 0  Housing: Unknown (02/13/2024)   Epic    Unable to Pay for Housing in the Last Year: No    Number of Times Moved in the Last Year: Not on file    Homeless in the  Last Year: No  Utilities: Not At Risk (02/13/2024)   Epic    Threatened with loss of utilities: No  Health Literacy: Adequate Health Literacy (02/13/2024)   B1300 Health Literacy    Frequency of need for help with medical instructions: Never     Constitutional: Pt reports headache. Denies fever, malaise, fatigue, or abrupt weight changes.  HEENT: Pt reports runny nose, right ear pain. Denies eye pain, eye redness, ringing in the ears, wax buildup, nasal congestion, bloody nose, or sore throat. Respiratory: Pt reports cough. Denies difficulty breathing, shortness of breath.   Cardiovascular: Denies chest pain, chest tightness, palpitations or swelling in the hands or feet.  Gastrointestinal: Denies abdominal pain, bloating, constipation, diarrhea or blood in the stool.  GU: Patient reports intermittent blood in urine.  Denies urgency, frequency, pain with urination, burning sensation, odor or discharge. Musculoskeletal: Patient reports chronic joint and muscle pain.  Denies decrease in range of motion, difficulty with gait, or joint swelling.  Skin: Denies redness, rashes, lesions or ulcercations.  Neurological: Patient reports neuropathy.  Denies dizziness, difficulty with memory, difficulty with speech or problems with balance and coordination.  Psych: Patient has a history of anxiety and depression.  Denies SI/HI.  No other specific complaints in a complete review of systems (except as listed in HPI above).  Objective:   BP 110/64 (BP Location: Right Arm, Patient Position: Sitting, Cuff Size: Normal)   Pulse 77   Ht 5' 2 (1.575 m)   Wt 140 lb (63.5 kg)  LMP 12/01/2017   SpO2 98%   BMI 25.61 kg/m    Wt Readings from Last 3 Encounters:  10/21/23 138 lb 9.6 oz (62.9 kg)  09/04/23 137 lb (62.1 kg)  06/13/23 139 lb 3.2 oz (63.1 kg)    General: Appears her stated age, overweight, in NAD. Skin: Warm, dry and intact. No ulcerations noted. HEENT: Head: normal shape and size, no  sinus tenderness noted; Eyes: sclera white, no icterus, conjunctiva pink, PERRLA and EOMs intact; Nose: mucosa boggy and moist, turbinates swollen; Throat: mucosa erythematous and moist, + PND, no tonsillar exudate or enlargement noted. Neck:  Neck supple, trachea midline. No masses, lumps or thyromegaly present.  Cardiovascular: Normal rate and rhythm. S1,S2 noted.  No murmur, rubs or gallops noted. No JVD or BLE edema. No carotid bruits noted. Pulmonary/Chest: Normal effort and dimished breath sounds. No respiratory distress. No wheezes, rales or ronchi noted.  Abdomen: Normal bowel sounds.  No CVA tenderness noted. Musculoskeletal: Strength 5/5 BUE/BLE. No difficulty with gait.  Neurological: Alert and oriented. Cranial nerves II-XII grossly intact. Coordination normal.  Psychiatric: Mood and affect normal. Behavior is normal. Judgment and thought content normal.     BMET    Component Value Date/Time   NA 138 10/21/2023 1540   NA 139 06/25/2017 1027   NA 134 (L) 12/07/2012 1809   K 4.3 10/21/2023 1540   K 3.4 (L) 12/07/2012 1809   CL 99 10/21/2023 1540   CL 103 12/07/2012 1809   CO2 29 10/21/2023 1540   CO2 25 12/07/2012 1809   GLUCOSE 152 (H) 10/21/2023 1540   GLUCOSE 274 (H) 12/07/2012 1809   BUN 19 10/21/2023 1540   BUN 10 06/25/2017 1027   BUN 10 12/07/2012 1809   CREATININE 1.29 (H) 10/21/2023 1540   CALCIUM  9.5 10/21/2023 1540   CALCIUM  8.6 12/07/2012 1809   GFRNONAA >60 09/08/2023 1425   GFRNONAA 84 09/07/2019 1019   GFRAA 97 09/07/2019 1019    Lipid Panel     Component Value Date/Time   CHOL 189 10/21/2023 1540   CHOL 120 12/24/2016 1827   TRIG 117 10/21/2023 1540   HDL 44 (L) 10/21/2023 1540   HDL 29 (L) 12/24/2016 1827   CHOLHDL 4.3 10/21/2023 1540   LDLCALC 123 (H) 10/21/2023 1540    CBC    Component Value Date/Time   WBC 11.0 (H) 10/21/2023 1540   RBC 4.78 10/21/2023 1540   HGB 13.6 10/21/2023 1540   HGB 12.8 06/25/2017 1027   HCT 41.7 10/21/2023  1540   HCT 38.3 06/25/2017 1027   PLT 269 10/21/2023 1540   PLT 348 06/25/2017 1027   MCV 87.2 10/21/2023 1540   MCV 90 06/25/2017 1027   MCV 80 12/07/2012 1809   MCH 28.5 10/21/2023 1540   MCHC 32.6 10/21/2023 1540   RDW 15.8 (H) 10/21/2023 1540   RDW 15.0 06/25/2017 1027   RDW 17.8 (H) 12/07/2012 1809   LYMPHSABS 1.3 06/12/2022 0516   LYMPHSABS 3.3 (H) 09/11/2016 1131   MONOABS 0.8 06/12/2022 0516   EOSABS 0.2 06/12/2022 0516   EOSABS 0.2 09/11/2016 1131   BASOSABS 0.0 06/12/2022 0516   BASOSABS 0.0 09/11/2016 1131    Hgb A1C Lab Results  Component Value Date   HGBA1C 7.0 (H) 10/21/2023            Assessment & Plan:   Preventative Health Maintenance:  Flu shot declined She declines tetanus for financial reasons, advised if she gets bit or cut to go  get this done Encouraged her to get a COVID booster Pneumovax  UTD Prevnar 20 declined Discussed Shingrix vaccine, she will check coverage with her insurance company and schedule a nurse visit if she would like to have this done Pap smear UTD  Mammogram ordered-she will call to schedule Colon screening scheduled 05/2024 CT lung cancer screening scheduled Encouraged her to consume a balanced diet and exercise regimen Advised her to see an eye doctor and dentist annually Will check CBC, c-Met, lipid, A1c today  Upper respiratory infection:  No indication for flu or COVID testing given duration of symptoms Given that she is immunocompromise will treat with doxycycline  100 mg twice daily x 10 days Encouraged rest and fluid Recommend Zyrtec and Flonase  OTC for additional symptomatic treatment  E. coli UTI:  She follows up with urology in 2 days, ask if they address this Push fluids  RTC in 6 months, follow-up chronic conditions Angeline Laura, NP  "

## 2024-04-06 ENCOUNTER — Ambulatory Visit: Payer: Self-pay | Admitting: Internal Medicine

## 2024-04-06 LAB — CBC
HCT: 42.8 % (ref 35.9–46.0)
Hemoglobin: 13.9 g/dL (ref 11.7–15.5)
MCH: 28.2 pg (ref 27.0–33.0)
MCHC: 32.5 g/dL (ref 31.6–35.4)
MCV: 86.8 fL (ref 81.4–101.7)
MPV: 10.3 fL (ref 7.5–12.5)
Platelets: 251 Thousand/uL (ref 140–400)
RBC: 4.93 Million/uL (ref 3.80–5.10)
RDW: 14 % (ref 11.0–15.0)
WBC: 7.8 Thousand/uL (ref 3.8–10.8)

## 2024-04-06 LAB — COMPREHENSIVE METABOLIC PANEL WITH GFR
AG Ratio: 1.3 (calc) (ref 1.0–2.5)
ALT: 9 U/L (ref 6–29)
AST: 10 U/L (ref 10–35)
Albumin: 4 g/dL (ref 3.6–5.1)
Alkaline phosphatase (APISO): 111 U/L (ref 37–153)
BUN: 16 mg/dL (ref 7–25)
CO2: 27 mmol/L (ref 20–32)
Calcium: 9.2 mg/dL (ref 8.6–10.4)
Chloride: 103 mmol/L (ref 98–110)
Creat: 0.77 mg/dL (ref 0.50–1.03)
Globulin: 3 g/dL (ref 1.9–3.7)
Glucose, Bld: 134 mg/dL (ref 65–139)
Potassium: 4 mmol/L (ref 3.5–5.3)
Sodium: 140 mmol/L (ref 135–146)
Total Bilirubin: 0.3 mg/dL (ref 0.2–1.2)
Total Protein: 7 g/dL (ref 6.1–8.1)
eGFR: 90 mL/min/1.73m2

## 2024-04-06 LAB — LIPID PANEL
Cholesterol: 164 mg/dL
HDL: 38 mg/dL — ABNORMAL LOW
LDL Cholesterol (Calc): 104 mg/dL — ABNORMAL HIGH
Non-HDL Cholesterol (Calc): 126 mg/dL
Total CHOL/HDL Ratio: 4.3 (calc)
Triglycerides: 119 mg/dL

## 2024-04-06 LAB — HEMOGLOBIN A1C
Hgb A1c MFr Bld: 6.8 % — ABNORMAL HIGH
Mean Plasma Glucose: 148 mg/dL
eAG (mmol/L): 8.2 mmol/L

## 2024-04-06 NOTE — Telephone Encounter (Signed)
-----   Message from [facility] sent at 04/05/2024  5:17 PM EST ----- Her urine culture grew some bacteria, however her PCP started her on culture appropriate doxycycline  today.  If she takes the doxycycline  and has no urinary symptoms on Wednesday, she may keep her  scheduled BCG appointment.  If she is having any UTI symptoms that day, I would recommend that we hold off on BCG until she is feeling better.

## 2024-04-06 NOTE — Telephone Encounter (Signed)
 Spoke with patient and informed her of culture results and previous message per Sam. Patient voiced understanding and was appreciative of the phone call.

## 2024-04-07 ENCOUNTER — Ambulatory Visit (INDEPENDENT_AMBULATORY_CARE_PROVIDER_SITE_OTHER): Admitting: Physician Assistant

## 2024-04-07 ENCOUNTER — Other Ambulatory Visit: Payer: Self-pay

## 2024-04-07 DIAGNOSIS — C679 Malignant neoplasm of bladder, unspecified: Secondary | ICD-10-CM

## 2024-04-07 DIAGNOSIS — Z8551 Personal history of malignant neoplasm of bladder: Secondary | ICD-10-CM

## 2024-04-07 DIAGNOSIS — Z122 Encounter for screening for malignant neoplasm of respiratory organs: Secondary | ICD-10-CM

## 2024-04-07 DIAGNOSIS — F17210 Nicotine dependence, cigarettes, uncomplicated: Secondary | ICD-10-CM

## 2024-04-07 DIAGNOSIS — Z87891 Personal history of nicotine dependence: Secondary | ICD-10-CM

## 2024-04-07 LAB — URINALYSIS, COMPLETE
Bilirubin, UA: NEGATIVE
Glucose, UA: NEGATIVE
Leukocytes,UA: NEGATIVE
Nitrite, UA: NEGATIVE
Protein,UA: NEGATIVE
Specific Gravity, UA: 1.03 (ref 1.005–1.030)
Urobilinogen, Ur: 0.2 mg/dL (ref 0.2–1.0)
pH, UA: 5.5 (ref 5.0–7.5)

## 2024-04-07 LAB — MICROSCOPIC EXAMINATION

## 2024-04-07 MED ORDER — BCG LIVE 50 MG IS SUSR
3.2400 mL | Freq: Once | INTRAVESICAL | Status: AC
  Administered 2024-04-07: 81 mg via INTRAVESICAL

## 2024-04-07 NOTE — Progress Notes (Signed)
 BCG Bladder Instillation  BCG # 2 of 3  Due to Bladder Cancer patient is present today for a BCG treatment. Patient was cleaned and prepped in a sterile fashion with betadine. A 14FR catheter was inserted, urine return was noted 10ml, urine was yellow in color.  50ml of reconstituted BCG was instilled into the bladder. The catheter was then removed. Patient tolerated well, no complications were noted  Performed by: Mayrani Khamis, PA-C and Mathew Pinal, RN  Follow up/ Additional notes: 1 week

## 2024-04-12 ENCOUNTER — Other Ambulatory Visit: Payer: Self-pay | Admitting: Urology

## 2024-04-12 ENCOUNTER — Other Ambulatory Visit: Payer: Self-pay | Admitting: Internal Medicine

## 2024-04-13 NOTE — Telephone Encounter (Signed)
 Requested medication (s) are due for refill today: Klonopin  - yes   Doxycycline  - no  Requested medication (s) are on the active medication list: No  Last refill:  Klonopin  - 03/08/24    Doxycycline  - 04/05/24  Future visit scheduled:   Notes to clinic: Klonopin  - not delegated.    Requested Prescriptions  Pending Prescriptions Disp Refills   clonazePAM  (KLONOPIN ) 0.5 MG tablet [Pharmacy Med Name: CLONAZEPAM  0.5 MG TABLET] 30 tablet 0    Sig: TAKE 1 TABLET BY MOUTH EVERY DAY AS NEEDED FOR ANXIETY     Not Delegated - Psychiatry: Anxiolytics/Hypnotics 2 Failed - 04/13/2024  3:07 PM      Failed - This refill cannot be delegated      Failed - Urine Drug Screen completed in last 360 days      Passed - Patient is not pregnant      Passed - Valid encounter within last 6 months    Recent Outpatient Visits           1 week ago Encounter for general adult medical examination with abnormal findings   [facility] [place], [place], NP   5 months ago Type 2 diabetes mellitus with diabetic polyneuropathy, without long-term current use of insulin  [facility])   [place] [facility] [place], Angeline ORN, NP       Future Appointments             Tomorrow Maurine Lukes, PA-C [facility] [place]             doxycycline  (VIBRA -TABS) 100 MG tablet [Pharmacy Med Name: DOXYCYCLINE  HYCLATE 100 MG TAB] 20 tablet 0    Sig: TAKE 1 TABLET BY MOUTH TWICE A DAY     Off-Protocol Failed - 04/13/2024  3:07 PM      Failed - Medication not assigned to a protocol, review manually.      Passed - Valid encounter within last 12 months    Recent Outpatient Visits           1 week ago Encounter for general adult medical examination with abnormal findings   [place] [facility] [place], [place], NP   5 months ago Type 2 diabetes mellitus with diabetic polyneuropathy, without long-term current use of insulin  [facility])   [place] [facility] [place], Angeline ORN, NP       Future Appointments             Tomorrow Vaillancourt, Samantha, PA-C [facility] [place]

## 2024-04-14 ENCOUNTER — Ambulatory Visit (INDEPENDENT_AMBULATORY_CARE_PROVIDER_SITE_OTHER): Admitting: Physician Assistant

## 2024-04-14 DIAGNOSIS — C679 Malignant neoplasm of bladder, unspecified: Secondary | ICD-10-CM

## 2024-04-14 DIAGNOSIS — Z8551 Personal history of malignant neoplasm of bladder: Secondary | ICD-10-CM

## 2024-04-14 LAB — URINALYSIS, COMPLETE
Bilirubin, UA: NEGATIVE
Glucose, UA: NEGATIVE
Leukocytes,UA: NEGATIVE
Nitrite, UA: NEGATIVE
Protein,UA: NEGATIVE
RBC, UA: NEGATIVE
Specific Gravity, UA: 1.03 (ref 1.005–1.030)
Urobilinogen, Ur: 0.2 mg/dL (ref 0.2–1.0)
pH, UA: 5.5 (ref 5.0–7.5)

## 2024-04-14 LAB — MICROSCOPIC EXAMINATION: Epithelial Cells (non renal): >10 /HPF — AB (ref 0–10)

## 2024-04-14 MED ORDER — BCG LIVE 50 MG IS SUSR
3.2400 mL | Freq: Once | INTRAVESICAL | Status: AC
  Administered 2024-04-14: 81 mg via INTRAVESICAL

## 2024-04-14 NOTE — Progress Notes (Signed)
 BCG Bladder Instillation  BCG # 3 of 3  Due to Bladder Cancer patient is present today for a BCG treatment. Patient was cleaned and prepped in a sterile fashion with betadine. A 14FR catheter was inserted, urine return was noted 5ml, urine was yellow in color.  50ml of reconstituted BCG was instilled into the bladder. The catheter was then removed. Patient tolerated well, no complications were noted  Performed by: Tonna Palazzi, PA-C and Mathew Pinal, RN  Additional notes: Patient's insurance provider changed on 1/1. She will call to inquire about Adstiladrin coverage.  Follow up: Return in about 4 weeks (around 05/12/2024) for Cysto with Dr. Francisca.

## 2024-04-14 NOTE — Patient Instructions (Signed)
 Adstiladrin

## 2024-04-14 NOTE — Progress Notes (Deleted)
 BCG Bladder Instillation  BCG # 3 of 3  Due to Bladder Cancer patient is present today for a BCG treatment. Patient was cleaned and prepped in a sterile fashion with betadine. A 16 FR catheter was inserted, urine return was noted 2 ml, urine was yellow in color.  50ml of reconstituted BCG was instilled into the bladder. The catheter was then removed. Patient tolerated well, no complications were noted  Performed by: Sheppard Hones, PA & Mathew Pinal, RN  Follow up/ Additional notes: ***

## 2024-04-20 ENCOUNTER — Other Ambulatory Visit: Payer: Self-pay | Admitting: Internal Medicine

## 2024-04-20 DIAGNOSIS — K219 Gastro-esophageal reflux disease without esophagitis: Secondary | ICD-10-CM

## 2024-04-20 NOTE — Telephone Encounter (Signed)
 Requested Prescriptions  Pending Prescriptions Disp Refills   pantoprazole  (PROTONIX ) 40 MG tablet [Pharmacy Med Name: PANTOPRAZOLE  SOD DR 40 MG TAB] 90 tablet 3    Sig: TAKE 1 TABLET (40 MG TOTAL) BY MOUTH DAILY AS NEEDED (HEARTBURN).     Gastroenterology: Proton Pump Inhibitors Passed - 04/20/2024  5:26 PM      Passed - Valid encounter within last 12 months    Recent Outpatient Visits           2 weeks ago Encounter for general adult medical examination with abnormal findings   [place] [facility] [place], [place], NP   6 months ago Type 2 diabetes mellitus with diabetic polyneuropathy, without long-term current use of insulin  [facility])   [place] [facility] [place], Angeline ORN, [place]

## 2024-05-05 ENCOUNTER — Ambulatory Visit: Payer: Self-pay | Admitting: *Deleted
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
## 2024-05-05 NOTE — Telephone Encounter (Signed)
 Patient disconnected call prior to warm transfer. Nurse will attempt to call patient back.

## 2024-05-06 ENCOUNTER — Other Ambulatory Visit: Payer: Self-pay

## 2024-05-06 ENCOUNTER — Encounter: Payer: Self-pay | Admitting: Emergency Medicine

## 2024-05-06 ENCOUNTER — Emergency Department
Admission: EM | Admit: 2024-05-06 | Discharge: 2024-05-06 | Disposition: A | Discharge status: Home / Self Care | Attending: Emergency Medicine | Admitting: Emergency Medicine

## 2024-05-06 DIAGNOSIS — T310 Burns involving less than 10% of body surface: Secondary | ICD-10-CM | POA: Insufficient documentation

## 2024-05-06 DIAGNOSIS — E1140 Type 2 diabetes mellitus with diabetic neuropathy, unspecified: Secondary | ICD-10-CM | POA: Insufficient documentation

## 2024-05-06 DIAGNOSIS — J449 Chronic obstructive pulmonary disease, unspecified: Secondary | ICD-10-CM | POA: Insufficient documentation

## 2024-05-06 DIAGNOSIS — X19XXXA Contact with other heat and hot substances, initial encounter: Secondary | ICD-10-CM | POA: Insufficient documentation

## 2024-05-06 DIAGNOSIS — Z23 Encounter for immunization: Secondary | ICD-10-CM | POA: Insufficient documentation

## 2024-05-06 DIAGNOSIS — T25221A Burn of second degree of right foot, initial encounter: Secondary | ICD-10-CM | POA: Insufficient documentation

## 2024-05-06 DIAGNOSIS — I2510 Atherosclerotic heart disease of native coronary artery without angina pectoris: Secondary | ICD-10-CM | POA: Insufficient documentation

## 2024-05-06 LAB — CBC WITH DIFFERENTIAL/PLATELET
Abs Immature Granulocytes: 0.03 10*3/uL (ref 0.00–0.07)
Basophils Absolute: 0.1 10*3/uL (ref 0.0–0.1)
Basophils Relative: 1 %
Eosinophils Absolute: 0.2 10*3/uL (ref 0.0–0.5)
Eosinophils Relative: 2 %
HCT: 41.4 % (ref 36.0–46.0)
Hemoglobin: 13.2 g/dL (ref 12.0–15.0)
Immature Granulocytes: 0 %
Lymphocytes Relative: 21 %
Lymphs Abs: 2.2 10*3/uL (ref 0.7–4.0)
MCH: 28.1 pg (ref 26.0–34.0)
MCHC: 31.9 g/dL (ref 30.0–36.0)
MCV: 88.3 fL (ref 80.0–100.0)
Monocytes Absolute: 0.7 10*3/uL (ref 0.1–1.0)
Monocytes Relative: 6 %
Neutro Abs: 7.5 10*3/uL (ref 1.7–7.7)
Neutrophils Relative %: 70 %
Platelets: 257 10*3/uL (ref 150–400)
RBC: 4.69 MIL/uL (ref 3.87–5.11)
RDW: 15.8 % — ABNORMAL HIGH (ref 11.5–15.5)
WBC: 10.7 10*3/uL — ABNORMAL HIGH (ref 4.0–10.5)
nRBC: 0 % (ref 0.0–0.2)

## 2024-05-06 LAB — COMPREHENSIVE METABOLIC PANEL WITH GFR
ALT: 14 U/L (ref 0–44)
AST: 15 U/L (ref 15–41)
Albumin: 4.3 g/dL (ref 3.5–5.0)
Alkaline Phosphatase: 132 U/L — ABNORMAL HIGH (ref 38–126)
Anion gap: 10 (ref 5–15)
BUN: 16 mg/dL (ref 6–20)
CO2: 28 mmol/L (ref 22–32)
Calcium: 9.4 mg/dL (ref 8.9–10.3)
Chloride: 102 mmol/L (ref 98–111)
Creatinine, Ser: 0.76 mg/dL (ref 0.44–1.00)
GFR, Estimated: >60 mL/min
Glucose, Bld: 180 mg/dL — ABNORMAL HIGH (ref 70–99)
Potassium: 4.5 mmol/L (ref 3.5–5.1)
Sodium: 139 mmol/L (ref 135–145)
Total Bilirubin: 0.4 mg/dL (ref 0.0–1.2)
Total Protein: 7.2 g/dL (ref 6.5–8.1)

## 2024-05-06 LAB — LACTIC ACID, PLASMA: Lactic Acid, Venous: 1.8 mmol/L (ref 0.5–1.9)

## 2024-05-06 MED ORDER — TETANUS-DIPHTH-ACELL PERTUSSIS 5-2-15.5 LF-MCG/0.5 IM SUSP
0.5000 mL | Freq: Once | INTRAMUSCULAR | Status: AC
  Administered 2024-05-06: 0.5 mL via INTRAMUSCULAR
  Filled 2024-05-06: qty 0.5

## 2024-05-06 MED ORDER — BACITRACIN 500 UNIT/GM EX OINT: 1.0000 | TOPICAL_OINTMENT | Freq: Two times a day (BID) | CUTANEOUS | 1 refills | Status: AC

## 2024-05-06 MED ORDER — CLINDAMYCIN HCL 150 MG PO CAPS: 450.0000 mg | ORAL_CAPSULE | Freq: Three times a day (TID) | ORAL | 0 refills | Status: AC
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
## 2024-05-12 ENCOUNTER — Other Ambulatory Visit: Admitting: Urology

## 2024-05-31 ENCOUNTER — Ambulatory Visit: Admit: 2024-05-31 | Admitting: Gastroenterology

## 2024-06-07 ENCOUNTER — Other Ambulatory Visit

## 2024-10-04 ENCOUNTER — Ambulatory Visit: Admitting: Internal Medicine

## 2025-02-16 ENCOUNTER — Ambulatory Visit
